# Patient Record
Sex: Male | Born: 1935
Health system: Southern US, Community
[De-identification: ages and names within clinical notes are randomized; demographics above are authoritative.]

## PROBLEM LIST (undated history)

## (undated) DIAGNOSIS — I4891 Unspecified atrial fibrillation: Secondary | ICD-10-CM

## (undated) DIAGNOSIS — M19072 Primary osteoarthritis, left ankle and foot: Secondary | ICD-10-CM

## (undated) DIAGNOSIS — I639 Cerebral infarction, unspecified: Secondary | ICD-10-CM

## (undated) DIAGNOSIS — Z8719 Personal history of other diseases of the digestive system: Secondary | ICD-10-CM

## (undated) DIAGNOSIS — M19071 Primary osteoarthritis, right ankle and foot: Secondary | ICD-10-CM

## (undated) DIAGNOSIS — Z87898 Personal history of other specified conditions: Secondary | ICD-10-CM

## (undated) DIAGNOSIS — I471 Supraventricular tachycardia: Secondary | ICD-10-CM

## (undated) DIAGNOSIS — E785 Hyperlipidemia, unspecified: Secondary | ICD-10-CM

## (undated) DIAGNOSIS — I4719 Other supraventricular tachycardia: Secondary | ICD-10-CM

## (undated) DIAGNOSIS — Z7901 Long term (current) use of anticoagulants: Secondary | ICD-10-CM

## (undated) DIAGNOSIS — E876 Hypokalemia: Secondary | ICD-10-CM

## (undated) DIAGNOSIS — Z9229 Personal history of other drug therapy: Secondary | ICD-10-CM

## (undated) DIAGNOSIS — K219 Gastro-esophageal reflux disease without esophagitis: Secondary | ICD-10-CM

## (undated) DIAGNOSIS — Z923 Personal history of irradiation: Secondary | ICD-10-CM

## (undated) DIAGNOSIS — I10 Essential (primary) hypertension: Secondary | ICD-10-CM

## (undated) DIAGNOSIS — I619 Nontraumatic intracerebral hemorrhage, unspecified: Secondary | ICD-10-CM

## (undated) DIAGNOSIS — G8929 Other chronic pain: Secondary | ICD-10-CM

## (undated) DIAGNOSIS — M549 Dorsalgia, unspecified: Secondary | ICD-10-CM

## (undated) DIAGNOSIS — G43909 Migraine, unspecified, not intractable, without status migrainosus: Secondary | ICD-10-CM

## (undated) DIAGNOSIS — C61 Malignant neoplasm of prostate: Secondary | ICD-10-CM

## (undated) DIAGNOSIS — C439 Malignant melanoma of skin, unspecified: Secondary | ICD-10-CM

## (undated) DIAGNOSIS — J189 Pneumonia, unspecified organism: Secondary | ICD-10-CM

## (undated) HISTORY — DX: Malignant neoplasm of prostate: C61

## (undated) HISTORY — DX: Dorsalgia, unspecified: M54.9

## (undated) HISTORY — PX: TONSILLECTOMY: SUR1361

## (undated) HISTORY — DX: Cerebral infarction, unspecified: I63.9

## (undated) HISTORY — DX: Hypokalemia: E87.6

## (undated) HISTORY — DX: Malignant melanoma of skin, unspecified: C43.9

## (undated) HISTORY — DX: Essential (primary) hypertension: I10

## (undated) HISTORY — DX: Personal history of other drug therapy: Z92.29

## (undated) HISTORY — DX: Personal history of other specified conditions: Z87.898

## (undated) HISTORY — DX: Hyperlipidemia, unspecified: E78.5

## (undated) HISTORY — DX: Long term (current) use of anticoagulants: Z79.01

## (undated) HISTORY — PX: CATARACT EXTRACTION W/ INTRAOCULAR LENS  IMPLANT, BILATERAL: SHX1307

## (undated) HISTORY — DX: Unspecified atrial fibrillation: I48.91

## (undated) HISTORY — DX: Other chronic pain: G89.29

---

## 1996-02-23 HISTORY — PX: NASAL SINUS SURGERY: SHX719

## 1997-06-07 ENCOUNTER — Other Ambulatory Visit: Admission: RE | Admit: 1997-06-07 | Discharge: 1997-06-07 | Payer: Self-pay | Admitting: Cardiology

## 2000-09-22 ENCOUNTER — Ambulatory Visit (HOSPITAL_COMMUNITY): Admission: RE | Admit: 2000-09-22 | Discharge: 2000-09-22 | Payer: Self-pay | Admitting: Gastroenterology

## 2000-12-13 ENCOUNTER — Encounter: Payer: Self-pay | Admitting: Emergency Medicine

## 2000-12-13 ENCOUNTER — Inpatient Hospital Stay (HOSPITAL_COMMUNITY): Admission: EM | Admit: 2000-12-13 | Discharge: 2000-12-27 | Payer: Self-pay | Admitting: Emergency Medicine

## 2000-12-13 ENCOUNTER — Encounter: Payer: Self-pay | Admitting: Neurology

## 2000-12-14 ENCOUNTER — Encounter: Payer: Self-pay | Admitting: *Deleted

## 2000-12-15 ENCOUNTER — Encounter: Payer: Self-pay | Admitting: *Deleted

## 2000-12-17 ENCOUNTER — Encounter: Payer: Self-pay | Admitting: Neurology

## 2000-12-18 ENCOUNTER — Encounter: Payer: Self-pay | Admitting: *Deleted

## 2000-12-18 ENCOUNTER — Encounter: Payer: Self-pay | Admitting: Neurology

## 2000-12-20 ENCOUNTER — Encounter: Payer: Self-pay | Admitting: *Deleted

## 2000-12-23 ENCOUNTER — Encounter: Payer: Self-pay | Admitting: *Deleted

## 2000-12-26 ENCOUNTER — Encounter: Payer: Self-pay | Admitting: *Deleted

## 2000-12-26 ENCOUNTER — Encounter: Payer: Self-pay | Admitting: Neurology

## 2000-12-27 ENCOUNTER — Inpatient Hospital Stay (HOSPITAL_COMMUNITY)
Admission: RE | Admit: 2000-12-27 | Discharge: 2001-01-28 | Payer: Self-pay | Admitting: Physical Medicine & Rehabilitation

## 2001-01-30 ENCOUNTER — Encounter
Admission: RE | Admit: 2001-01-30 | Discharge: 2001-04-30 | Payer: Self-pay | Admitting: Physical Medicine & Rehabilitation

## 2001-05-01 ENCOUNTER — Encounter
Admission: RE | Admit: 2001-05-01 | Discharge: 2001-05-24 | Payer: Self-pay | Admitting: Physical Medicine & Rehabilitation

## 2002-07-19 ENCOUNTER — Emergency Department (HOSPITAL_COMMUNITY): Admission: EM | Admit: 2002-07-19 | Discharge: 2002-07-19 | Payer: Self-pay | Admitting: Emergency Medicine

## 2002-07-19 DIAGNOSIS — Z87898 Personal history of other specified conditions: Secondary | ICD-10-CM

## 2002-07-19 HISTORY — DX: Personal history of other specified conditions: Z87.898

## 2002-11-29 ENCOUNTER — Encounter
Admission: RE | Admit: 2002-11-29 | Discharge: 2003-02-27 | Payer: Self-pay | Admitting: Physical Medicine & Rehabilitation

## 2002-12-10 ENCOUNTER — Encounter
Admission: RE | Admit: 2002-12-10 | Discharge: 2003-01-31 | Payer: Self-pay | Admitting: Physical Medicine & Rehabilitation

## 2003-02-27 ENCOUNTER — Encounter
Admission: RE | Admit: 2003-02-27 | Discharge: 2003-05-28 | Payer: Self-pay | Admitting: Physical Medicine & Rehabilitation

## 2007-03-22 ENCOUNTER — Encounter: Admission: RE | Admit: 2007-03-22 | Discharge: 2007-03-22 | Payer: Self-pay | Admitting: Cardiology

## 2008-02-23 HISTORY — PX: MELANOMA EXCISION: SHX5266

## 2008-02-23 HISTORY — PX: SKIN GRAFT: SHX250

## 2008-07-05 ENCOUNTER — Encounter (INDEPENDENT_AMBULATORY_CARE_PROVIDER_SITE_OTHER): Payer: Self-pay | Admitting: General Surgery

## 2008-07-05 ENCOUNTER — Ambulatory Visit (HOSPITAL_COMMUNITY): Admission: RE | Admit: 2008-07-05 | Discharge: 2008-07-05 | Payer: Self-pay | Admitting: General Surgery

## 2008-07-18 ENCOUNTER — Encounter (INDEPENDENT_AMBULATORY_CARE_PROVIDER_SITE_OTHER): Payer: Self-pay | Admitting: General Surgery

## 2008-07-18 ENCOUNTER — Ambulatory Visit (HOSPITAL_BASED_OUTPATIENT_CLINIC_OR_DEPARTMENT_OTHER): Admission: RE | Admit: 2008-07-18 | Discharge: 2008-07-18 | Payer: Self-pay | Admitting: General Surgery

## 2009-04-27 ENCOUNTER — Ambulatory Visit: Payer: Self-pay | Admitting: Vascular Surgery

## 2009-04-27 ENCOUNTER — Inpatient Hospital Stay (HOSPITAL_COMMUNITY): Admission: EM | Admit: 2009-04-27 | Discharge: 2009-05-02 | Payer: Self-pay | Admitting: Emergency Medicine

## 2009-04-29 ENCOUNTER — Encounter: Payer: Self-pay | Admitting: Cardiology

## 2009-04-30 ENCOUNTER — Encounter: Payer: Self-pay | Admitting: Cardiology

## 2009-05-16 ENCOUNTER — Encounter: Payer: Self-pay | Admitting: Interventional Radiology

## 2009-06-04 ENCOUNTER — Ambulatory Visit: Admission: RE | Admit: 2009-06-04 | Discharge: 2009-08-04 | Payer: Self-pay | Admitting: Radiation Oncology

## 2009-10-07 ENCOUNTER — Ambulatory Visit: Payer: Self-pay | Admitting: Cardiology

## 2009-10-15 ENCOUNTER — Ambulatory Visit: Payer: Self-pay | Admitting: Cardiology

## 2009-11-11 ENCOUNTER — Ambulatory Visit: Payer: Self-pay | Admitting: Cardiology

## 2009-12-12 ENCOUNTER — Ambulatory Visit: Payer: Self-pay | Admitting: Cardiology

## 2010-01-09 ENCOUNTER — Ambulatory Visit: Payer: Self-pay | Admitting: Cardiology

## 2010-01-29 ENCOUNTER — Ambulatory Visit: Payer: Self-pay | Admitting: Cardiology

## 2010-03-03 ENCOUNTER — Ambulatory Visit: Payer: Self-pay | Admitting: Cardiovascular Disease

## 2010-03-13 ENCOUNTER — Ambulatory Visit: Payer: Self-pay | Admitting: Cardiology

## 2010-03-18 ENCOUNTER — Ambulatory Visit: Payer: Self-pay | Admitting: Cardiovascular Disease

## 2010-03-26 ENCOUNTER — Encounter (INDEPENDENT_AMBULATORY_CARE_PROVIDER_SITE_OTHER): Payer: Medicare Other

## 2010-03-26 DIAGNOSIS — Z7901 Long term (current) use of anticoagulants: Secondary | ICD-10-CM

## 2010-04-24 ENCOUNTER — Other Ambulatory Visit (INDEPENDENT_AMBULATORY_CARE_PROVIDER_SITE_OTHER): Payer: Medicare Other

## 2010-04-24 DIAGNOSIS — I4891 Unspecified atrial fibrillation: Secondary | ICD-10-CM

## 2010-04-24 DIAGNOSIS — I119 Hypertensive heart disease without heart failure: Secondary | ICD-10-CM

## 2010-04-24 DIAGNOSIS — Z7901 Long term (current) use of anticoagulants: Secondary | ICD-10-CM

## 2010-05-01 ENCOUNTER — Ambulatory Visit (INDEPENDENT_AMBULATORY_CARE_PROVIDER_SITE_OTHER): Payer: Medicare Other | Admitting: Cardiology

## 2010-05-01 DIAGNOSIS — Z79899 Other long term (current) drug therapy: Secondary | ICD-10-CM

## 2010-05-01 DIAGNOSIS — I4891 Unspecified atrial fibrillation: Secondary | ICD-10-CM

## 2010-05-15 ENCOUNTER — Other Ambulatory Visit: Payer: Self-pay | Admitting: *Deleted

## 2010-05-15 DIAGNOSIS — I4891 Unspecified atrial fibrillation: Secondary | ICD-10-CM

## 2010-05-15 MED ORDER — WARFARIN SODIUM 5 MG PO TABS: ORAL_TABLET | ORAL | Status: DC

## 2010-05-15 NOTE — Telephone Encounter (Signed)
Refilled meds per fax request.  

## 2010-05-17 LAB — URINALYSIS, MICROSCOPIC ONLY
Bilirubin Urine: NEGATIVE
Ketones, ur: NEGATIVE mg/dL
Nitrite: NEGATIVE
Specific Gravity, Urine: 1.016 (ref 1.005–1.030)
Urobilinogen, UA: 1 mg/dL (ref 0.0–1.0)

## 2010-05-17 LAB — CBC
HCT: 31.5 % — ABNORMAL LOW (ref 39.0–52.0)
HCT: 31.6 % — ABNORMAL LOW (ref 39.0–52.0)
HCT: 34.5 % — ABNORMAL LOW (ref 39.0–52.0)
HCT: 35.5 % — ABNORMAL LOW (ref 39.0–52.0)
HCT: 38 % — ABNORMAL LOW (ref 39.0–52.0)
Hemoglobin: 10.7 g/dL — ABNORMAL LOW (ref 13.0–17.0)
Hemoglobin: 11.5 g/dL — ABNORMAL LOW (ref 13.0–17.0)
Hemoglobin: 11.7 g/dL — ABNORMAL LOW (ref 13.0–17.0)
Hemoglobin: 12.1 g/dL — ABNORMAL LOW (ref 13.0–17.0)
Hemoglobin: 12.8 g/dL — ABNORMAL LOW (ref 13.0–17.0)
MCHC: 33.3 g/dL (ref 30.0–36.0)
MCHC: 33.7 g/dL (ref 30.0–36.0)
MCHC: 33.8 g/dL (ref 30.0–36.0)
MCHC: 33.9 g/dL (ref 30.0–36.0)
MCHC: 34.1 g/dL (ref 30.0–36.0)
MCV: 92.8 fL (ref 78.0–100.0)
MCV: 92.9 fL (ref 78.0–100.0)
MCV: 93.4 fL (ref 78.0–100.0)
Platelets: 164 10*3/uL (ref 150–400)
Platelets: 172 10*3/uL (ref 150–400)
Platelets: 198 10*3/uL (ref 150–400)
RBC: 3.39 MIL/uL — ABNORMAL LOW (ref 4.22–5.81)
RBC: 3.82 MIL/uL — ABNORMAL LOW (ref 4.22–5.81)
RDW: 14.9 % (ref 11.5–15.5)
RDW: 15 % (ref 11.5–15.5)
RDW: 15.1 % (ref 11.5–15.5)
RDW: 15.2 % (ref 11.5–15.5)
RDW: 15.3 % (ref 11.5–15.5)
RDW: 15.6 % — ABNORMAL HIGH (ref 11.5–15.5)
WBC: 10.5 10*3/uL (ref 4.0–10.5)
WBC: 8 10*3/uL (ref 4.0–10.5)

## 2010-05-17 LAB — COMPREHENSIVE METABOLIC PANEL
ALT: 14 U/L (ref 0–53)
AST: 17 U/L (ref 0–37)
AST: 29 U/L (ref 0–37)
Albumin: 2.3 g/dL — ABNORMAL LOW (ref 3.5–5.2)
BUN: 31 mg/dL — ABNORMAL HIGH (ref 6–23)
BUN: 38 mg/dL — ABNORMAL HIGH (ref 6–23)
Chloride: 108 mEq/L (ref 96–112)
Creatinine, Ser: 2.06 mg/dL — ABNORMAL HIGH (ref 0.4–1.5)
Creatinine, Ser: 2.11 mg/dL — ABNORMAL HIGH (ref 0.4–1.5)
GFR calc Af Amer: 37 mL/min — ABNORMAL LOW (ref >60)
Total Protein: 5.4 g/dL — ABNORMAL LOW (ref 6.0–8.3)
Total Protein: 6.4 g/dL (ref 6.0–8.3)

## 2010-05-17 LAB — POCT I-STAT, CHEM 8
BUN: 43 mg/dL — ABNORMAL HIGH (ref 6–23)
HCT: 38 % — ABNORMAL LOW (ref 39.0–52.0)
Hemoglobin: 12.9 g/dL — ABNORMAL LOW (ref 13.0–17.0)

## 2010-05-17 LAB — BASIC METABOLIC PANEL
CO2: 29 mEq/L (ref 19–32)
Calcium: 9.9 mg/dL (ref 8.4–10.5)
Creatinine, Ser: 2.04 mg/dL — ABNORMAL HIGH (ref 0.4–1.5)
Glucose, Bld: 109 mg/dL — ABNORMAL HIGH (ref 70–99)

## 2010-05-17 LAB — TROPONIN I: Troponin I: 0.01 ng/mL (ref 0.00–0.06)

## 2010-05-17 LAB — DIFFERENTIAL
Basophils Absolute: 0 10*3/uL (ref 0.0–0.1)
Basophils Relative: 0 % (ref 0–1)
Eosinophils Absolute: 0.2 10*3/uL (ref 0.0–0.7)
Eosinophils Relative: 2 % (ref 0–5)
Eosinophils Relative: 2 % (ref 0–5)
Lymphocytes Relative: 25 % (ref 12–46)
Lymphs Abs: 2.4 10*3/uL (ref 0.7–4.0)
Lymphs Abs: 2.6 10*3/uL (ref 0.7–4.0)
Monocytes Absolute: 0.9 10*3/uL (ref 0.1–1.0)
Monocytes Absolute: 1.1 10*3/uL — ABNORMAL HIGH (ref 0.1–1.0)
Monocytes Relative: 11 % (ref 3–12)
Monocytes Relative: 9 % (ref 3–12)
Neutro Abs: 5.8 10*3/uL (ref 1.7–7.7)
Neutrophils Relative %: 64 % (ref 43–77)

## 2010-05-17 LAB — POCT CARDIAC MARKERS
CKMB, poc: 2 ng/mL (ref 1.0–8.0)
Myoglobin, poc: 204 ng/mL (ref 12–200)
Troponin i, poc: <0.05 ng/mL (ref 0.00–0.09)

## 2010-05-17 LAB — CULTURE, BLOOD (ROUTINE X 2): Culture: NO GROWTH

## 2010-05-17 LAB — CK TOTAL AND CKMB (NOT AT ARMC): Relative Index: 1.7 (ref 0.0–2.5)

## 2010-05-17 LAB — LIPASE, BLOOD: Lipase: 30 U/L (ref 11–59)

## 2010-05-17 LAB — HEPARIN LEVEL (UNFRACTIONATED)
Heparin Unfractionated: 0.43 IU/mL (ref 0.30–0.70)
Heparin Unfractionated: 0.57 IU/mL (ref 0.30–0.70)

## 2010-05-17 LAB — HEPATIC FUNCTION PANEL
Indirect Bilirubin: 0.5 mg/dL (ref 0.3–0.9)
Total Bilirubin: 0.6 mg/dL (ref 0.3–1.2)
Total Protein: 6.9 g/dL (ref 6.0–8.3)

## 2010-05-17 LAB — APTT: aPTT: 31 seconds (ref 24–37)

## 2010-05-17 LAB — PSA: PSA: 19.59 ng/mL — ABNORMAL HIGH (ref 0.10–4.00)

## 2010-05-17 LAB — HEMOGLOBIN A1C: Hgb A1c MFr Bld: 6 % (ref 4.6–6.1)

## 2010-05-17 LAB — PROTIME-INR
Prothrombin Time: 14.4 seconds (ref 11.6–15.2)
Prothrombin Time: 15.3 seconds — ABNORMAL HIGH (ref 11.6–15.2)

## 2010-05-25 ENCOUNTER — Ambulatory Visit (INDEPENDENT_AMBULATORY_CARE_PROVIDER_SITE_OTHER): Payer: Medicare Other | Admitting: *Deleted

## 2010-05-25 DIAGNOSIS — I482 Chronic atrial fibrillation, unspecified: Secondary | ICD-10-CM | POA: Insufficient documentation

## 2010-05-25 DIAGNOSIS — Z7901 Long term (current) use of anticoagulants: Secondary | ICD-10-CM

## 2010-05-25 DIAGNOSIS — I4891 Unspecified atrial fibrillation: Secondary | ICD-10-CM

## 2010-06-02 ENCOUNTER — Other Ambulatory Visit: Payer: Self-pay | Admitting: *Deleted

## 2010-06-02 LAB — CBC
HCT: 42.5 % (ref 39.0–52.0)
Hemoglobin: 14.3 g/dL (ref 13.0–17.0)
MCHC: 33.7 g/dL (ref 30.0–36.0)
MCV: 93.7 fL (ref 78.0–100.0)
Platelets: 203 10*3/uL (ref 150–400)
RDW: 14.2 % (ref 11.5–15.5)
WBC: 9.3 10*3/uL (ref 4.0–10.5)

## 2010-06-02 LAB — BASIC METABOLIC PANEL
CO2: 28 mEq/L (ref 19–32)
Calcium: 10.1 mg/dL (ref 8.4–10.5)
Chloride: 105 mEq/L (ref 96–112)
GFR calc Af Amer: 35 mL/min — ABNORMAL LOW (ref >60)
Sodium: 140 mEq/L (ref 135–145)

## 2010-06-02 LAB — POCT HEMOGLOBIN-HEMACUE: Hemoglobin: 13.8 g/dL (ref 13.0–17.0)

## 2010-06-02 LAB — GLUCOSE, CAPILLARY

## 2010-06-02 MED ORDER — FUROSEMIDE 20 MG PO TABS: 20.0000 mg | ORAL_TABLET | Freq: Every day | ORAL | Status: DC

## 2010-06-11 ENCOUNTER — Other Ambulatory Visit: Payer: Self-pay | Admitting: *Deleted

## 2010-06-11 DIAGNOSIS — I10 Essential (primary) hypertension: Secondary | ICD-10-CM

## 2010-06-11 MED ORDER — POTASSIUM CHLORIDE CRYS ER 20 MEQ PO TBCR: 20.0000 meq | EXTENDED_RELEASE_TABLET | Freq: Every day | ORAL | Status: DC

## 2010-06-11 NOTE — Telephone Encounter (Signed)
Faxed refill for potassium 20 meq to [facility]

## 2010-06-23 ENCOUNTER — Encounter: Payer: Medicare Other | Admitting: *Deleted

## 2010-06-25 ENCOUNTER — Ambulatory Visit (INDEPENDENT_AMBULATORY_CARE_PROVIDER_SITE_OTHER): Payer: Medicare Other | Admitting: *Deleted

## 2010-06-25 DIAGNOSIS — I4891 Unspecified atrial fibrillation: Secondary | ICD-10-CM

## 2010-06-25 LAB — POCT INR: INR: 3.8

## 2010-06-30 ENCOUNTER — Telehealth: Payer: Self-pay | Admitting: *Deleted

## 2010-06-30 NOTE — Telephone Encounter (Signed)
Patient wife phoned stating Dr. Mayford Knife wanted to put patient on cipro or septra which ever one didn't effect coumadin.  Advised they both did.  Dr. Mayford Knife preference was septra bid for 10 days for skin/scalp problem.  Will check INR Friday to see what level is on antibiotic.

## 2010-07-03 ENCOUNTER — Ambulatory Visit (INDEPENDENT_AMBULATORY_CARE_PROVIDER_SITE_OTHER): Payer: Medicare Other | Admitting: *Deleted

## 2010-07-03 DIAGNOSIS — I4891 Unspecified atrial fibrillation: Secondary | ICD-10-CM

## 2010-07-07 NOTE — Op Note (Signed)
NAME:  Craig, [name] NO.:  [id number]   MEDICAL RECORD NO.:  [id number]          PATIENT TYPE:  AMB   LOCATION:  SDS                          FACILITY:  [facility]   PHYSICIAN:  Gabrielle Dare. Janee Morn, M.D.DATE OF BIRTH:  May 25, 1924   DATE OF PROCEDURE:  07/05/2008  DATE OF DISCHARGE:  07/05/2008                               OPERATIVE REPORT   PREOPERATIVE DIAGNOSIS:  Melanoma in situ scalp.   POSTOPERATIVE DIAGNOSIS:  Melanoma in situ scalp.   PROCEDURE:  Wide excision melanoma in situ scalp 3.5 cm x 3.5 cm.   SURGEON:  Gabrielle Dare. Janee Morn, MD   ANESTHESIA:  MAC.   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  He has been  followed by Dr. Mayford Knife.  Since that time, he developed a pigmented  lesion in a portion of the skin graft from his previous surgery.  He  underwent a biopsy there and it demonstrates melanoma in situ.  We spoke  in detail in the office and the patient has had a lot of trouble with  his old skin graft and he declines any skin graft coverage at this time  and wants to just do some local wound care and see how the wound heals  primarily.  He presents for elective wide excision of this melanoma in  situ today.   PROCEDURE IN DETAIL:  Informed consent was obtained.  The patient's site  was marked.  He was identified in the preop holding area.  He received  intravenous antibiotics.  He was brought to the operating room.  MAC  anesthesia was administered by the anesthesia staff.  The primary scalp  was prepped and draped in sterile fashion.  Time-out procedure was done.  An area giving at least 1 cm margin circumferentially around this  pigment and in partially scab lesion was measured.  The area was then  infiltrated with 0.25% Marcaine with epinephrine.  Wide excision was  then done sharply and using Bovie  cautery.  I have taken all of the skin  and subcutaneous fat.  There is no evidence of pigmentation on the under  side of the excision.  The excision was labeled with suture for  pathology and passed off.  Hemostasis was obtained in the wound.  Some  additional local anesthetic was injected and once hemostasis was  ensured, a wet-to-dry dressing was placed.  This completed the  procedure.  The patient tolerated the procedure well without apparent  complication and was taken to recovery room in stable condition.      Gabrielle Dare Janee Morn, M.D.  Electronically Signed    BET/MEDQ  D:  07/05/2008  T:  07/06/2008  Job:  [id number]   cc:   [name], M.D.  Dollene Cleveland, M.D.

## 2010-07-07 NOTE — Op Note (Signed)
NAME:  Craig [name], Craig [name] NO.:  [id number]   MEDICAL RECORD NO.:  [id number]          PATIENT TYPE:  AMB   LOCATION:  DSC                          FACILITY:  [facility]   PHYSICIAN:  Gabrielle Dare. Janee Morn, M.D.DATE OF BIRTH:  August 08, 1924   DATE OF PROCEDURE:  07/18/2008  DATE OF DISCHARGE:                               OPERATIVE REPORT   PREOPERATIVE DIAGNOSIS:  Melanoma in situ, scalp.   POSTOPERATIVE DIAGNOSIS:  Melanoma in situ, scalp.   PROCEDURE:  Wide excision of melanoma in situ, scalp, 4 x 3.5 cm.   SURGEON:  Gabrielle Dare. 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  He developed a pigmented area along one side which was biopsied  demonstrating melanoma in situ.  He underwent wide excision of this area  about 2 weeks ago, however, on followup with his dermatologist, this  excision did not encompass the entirety of the area of concern.  So, he  presents for wide excision again at the adjacent area.   PROCEDURE IN DETAIL:  Informed consent was obtained.  The patient's site  was clearly marked in the preop holding area.  It had also been marked  yesterday by his dermatologist.  He was brought to the operating room.  MAC anesthesia was administered.  He received intravenous antibiotics.  His scalp was prepped and draped in sterile fashion.  The area was  measured out giving at least 1-cm margin circumferentially around this  pigmented area.  Subcutaneous tissues were injected with a mixture of  0.25% Marcaine plain and 1% lidocaine with epinephrine.  An incision was  made.  Subcutaneous tissues were dissected down encompassing the skin  and subcutaneous fat.  This entire area was excised.  It was marked with  sutures for orientation for pathology and sent fresh.  The skin defect  was  then irrigated.  Hemostasis was obtained using Bovie cautery.  Again  per the patient's request, no skin graft was done.  We checked the wound  carefully and ensured good hemostasis and then placed wet-to-dry  dressings on this site and the old wide excision site which were  sterile.  There were no apparent complications.  The patient tolerated  the procedure well without apparent complication and was taken to the  recovery room in stable condition.      Gabrielle Dare Janee Morn, M.D.  Electronically Signed     BET/MEDQ  D:  07/18/2008  T:  07/19/2008  Job:  [id number]   cc:   Dollene Cleveland, M.D.  [name], M.D.

## 2010-07-09 ENCOUNTER — Ambulatory Visit (INDEPENDENT_AMBULATORY_CARE_PROVIDER_SITE_OTHER): Payer: Medicare Other | Admitting: *Deleted

## 2010-07-09 ENCOUNTER — Telehealth: Payer: Self-pay | Admitting: Cardiology

## 2010-07-09 DIAGNOSIS — I4891 Unspecified atrial fibrillation: Secondary | ICD-10-CM

## 2010-07-09 MED ORDER — WARFARIN SODIUM 2.5 MG PO TABS: ORAL_TABLET | ORAL | Status: DC

## 2010-07-09 NOTE — Telephone Encounter (Signed)
Pt's wife said she will not be home until 430 and wants to know husbands coumadin dosage

## 2010-07-09 NOTE — Telephone Encounter (Signed)
Phoned in new dose of coumadin

## 2010-07-10 NOTE — Assessment & Plan Note (Signed)
Craig [name] is back regarding his right cerebellar and pontine strokes.  We sent  him for outpatient therapy with suspicion of some vestibular problems.  He  is not sure if therapy has helped him significantly with those.  He is  having positional symptoms that only last for a second or two and then he  seems to re-equilibrate.  He has had no near falls.  He does feel that  therapy has helped with his gait efficiency.  He denies any mood or sleep  problems.  Bowel and bladder habits are normal.  No chest pain, shortness of  breath, nausea, or vomiting.  He has not been persistent with following  through with his vestibular exercises, however.   PHYSICAL EXAMINATION:  On physical examination today, the patient is  pleasant and in no acute distress.  The blood pressure is 118/66 and the  pulse is 56.  He is 96% on room air.  Gait pattern was actually more fluid  than previously.  He still some steppage gait on the left side, but this is  really minimally.  He had good weight shift overall.  Reflexes are 2+.  Fine  motor coordination was fair to good.  Strength was 5/5.  Cranial nerve exam  is grossly intact.  Hallpike's maneuver was negative for symptoms or  nystagmus today.   ASSESSMENT:  1. Status post right cerebellar and right pontine strokes.  2. Positional vertigo.  3. Gait disorder.   PLAN:  1. The patient needs to follow up with his home exercise program.  I think     that he would do well with a supervision Nautilus weight program.  He     also would benefit from an aerobics type of program, including treadmill     or a bicycle.  He also needs to remember to do his vestibular exercises.  2. The patient is to continue to monitor his blood pressure closely as well,     which he is doing at this point.  3. I can see the patient back on an as needed basis.      Ranelle Oyster, M.D.   ZTS/MedQ  D:  03/01/2003 11:56:30  T:  03/01/2003 12:09:28  Job #:  [id number]   cc:   [name], M.D.  [address], [address]  [place]  [place] [address]  Fax: [phone]

## 2010-07-10 NOTE — Consult Note (Signed)
[place]  Patient:    Craig [name], Craig [name] Visit Number: [id number] MRN: [id number]          Service Type: Attending:  Rozanna Boer., M.D. Dictated by:   Rozanna Boer., M.D. Proc. Date: 12/27/00                            Consultation Report  REASON FOR CONSULTATION:  Urinary retention after stroke.  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 The stroke was on December 13, 2000. He presented with slurred speech and some difficulty with his equilibrium. He had distal left vertebral stenosis and occlusion of his right vertebral artery. The patient was placed on Coumadin for CVA prophylaxis and was previously on Plavix. After the hemorrhagic CVA on December 18, 2000, he was transferred to rehab. I have seen Maddox over the years for his mild obstructive symptoms and his mildly elevated PSA. I last saw him on Jul 04, 2000, when his PSA was 4.77. His free PSA, however, was 28% which is in the benign range, and we were just following that. He was due to have another check this month. His PSA ranges from 3 to 5.2 which was the highest he has had over the last 10 years. When I saw him in May, he was complaining of a little bit of trouble with his eyes and double vision that had cleared up, and it was thought that maybe this was a slight stroke even at that time. He had been on Plavix, aspirin, Ziac, and Norvasc. Since he has been in the rehab, he first was on catheter drainage because of large residuals that reached as high as 900 cc. His diplopia of his right gaze has improved, but he is still somewhat weakened. He is doing fairly well with his rehab. He started bladder training on November 13 with in-and-out catheterizations every six hours. Over the last  week, he was emptying about a third with voiding anywhere from 100 to 300 cc with residuals of anywhere from 300 to 500 cc. The catheter attempts are not that uncomfortable, and he is doing fairly well with his rehab. He has been on Flomax since he has been in rehab but started on low-dose Urecholine last week at 10 mg q.i.d. Today, the 18th, I increased his Urecholine to 25 mg p.o. q.i.d. which was to be given 30 to 60 minutes before his q.6h. catheterization so he can have time to void. I also increased his Flomax from 0.4 mg daily to 0.4 mg b.i.d. and added Proscar 5 mg daily. We will send a catheterized specimen for culture to treat if he does develop a UTI. We will encourage his bladder training which is progressing fairly well at this point.  IMPRESSION: 1. Neurogenic bladder secondary to cerebellar and right pontine stroke. 2. Mild obstructive uropathy with elevated PSA of an apparent benign origin.  RECOMMENDATION:  We will have to follow up his PSA at a later time once he is voiding and he is not being catheterized. We will continue to follow with you. ictated by:   Rozanna Boer., M.D. Attending:  Rozanna Boer., M.D. DD:  01/09/01 TD:  01/09/01 Job: [id number]

## 2010-07-10 NOTE — Discharge Summary (Signed)
[place]  Patient:    Craig [name], Craig [name] Visit Number: [id number] MRN: [id number]          Service Type: [facility] Location: [id number] Attending Physician:  Faith Rogue T Dictated by:   Annie Main, N.P. Admit Date:  12/27/2000 Disc. Date: 12/27/00   CC:         [name] A. Patty Sermons, M.D.             Rozanna Boer., M.D.                           Discharge Summary  DATE OF BIRTH: [birth year]  ADMISSION DIAGNOSIS: Right pontine stroke.  DISCHARGE DIAGNOSES: 1. Right pontine nonhemorrhagic embolic infarction. 2. Ischemic right cerebellar infarction with embolic source. 3. Right cerebellar hemorrhagic transformation.  ADDITIONAL DISCHARGE DIAGNOSES:  1. Hypertension.  2. Hypercholesterolemia.  3. Obesity.  4. Transient ischemic attack.  5. Has a history of spontaneous atrial fibrillation in 1994 which converted     spontaneous and, to the rest of anybody knowledge, has not returned.  6. Urinary tract infection this admission.  7. Sinus disease status post sinus surgery.  8. Chronic renal insufficiency.  9. Benign prostatic hypertrophy. 10. Migraine R without headache.   DISCHARGE MEDICATIONS: 1. Flonase once per day each nostril q.h.s. 2. Senokot S one tablet p.o. b.i.d. 3. Cozaar 15 mg q.d. 4. Hydrochlorothiazide 12.5 mg q.d. 5. Ziac 10/6.25 q.d. 6. Protonix 40 mg q.d. 7. Potassium chloride 20 mEq q.d.  ACTIVITY:  Up with assistance as tolerated.  DIET:  Regular consistency with thin liquids.  STUDIES PERFORMED: 1. A CT of the head performed 10/22 showing mild symmetrical atrophy.  No evidence of intracranial mass or hemorrhage.  ___________ windows are normal except for minimal mucosal membrane thickening of the ethmoid air cells.  2. MRI performed 12/13/00 showing an acute infarction of the right cerebellar and the distribution of the right PICA and superior cerebellar arteries. Also acute infarction of the  right pons involvement of perforating basilar artery branches, plus acute infarction involving the medical aspect of the right temporal lobe due to involvement of the right PCA temporal branch.  3. MRA performed 12/13/00 showing distal left vertebral artery stenosis is progressing.  Occlusion of right vertebral artery, including its PICA branch. Disease involving the right PCA and right superior cerebellar artery.  There is disease involving the middle cerebral arteries, particularly on the left with significant appearing left M 1 segment stenosis.  4. CT of the head performed 12/14/00 showing evolving nonhemorrhagic stroke involving the right pons, right cerebellar hemisphere, and most likely the medial right temporal lobe.  No evidence of hemorrhage.  5. EKG performed 12/13/00 showing sinus bradycardia with nonspecific ST abnormality.  6. Carotid Dopplers performed 12/14/00 showing no ICA stenosis bilateral. Left ECA stenosis.  Vertebral artery flow antegrade bilaterally.  7. A 2-D echo performed 12/14/00 showing ejection fraction of 65-75% with no LV wall abnormalities.  No embolic source.  8. Swallowing function performed 12/15/00 showing intermittent penetration to the cords with thin liquids.  9. CT of the head performed 12/17/00 showing interval of development of a 3 cm cerebellar ___________ hemorrhage inferiorly with mass effect on the fourth ventricle, although no hydrocephalus at this stage.  Infarcts in the right cerebellar, right pons, and right medial temporal lobe remain present.  10. Chest x-ray performed 12/18/00 showing right lower lobe infiltrate versus atelectasis.  Contrast present in  the colon.  No signs of dilated bowels. Small bowel is gasless.  Cardiomegaly.  11. CT of the head performed 12/18/00 showing slight increased size hemorrhagic transformation of cerebellar infarct.  Slight increase mass effect on the fourth ventricle.  No intravenous of  hydrocephalus.  12. CT of the head performed 12/20/00 showing stable hemorrhagic cerebellar stroke with stable right pontine and right temporal parietal deep nonhemorrhagic stroke.  No new intracranial abnormalities.  No evidence of hydrocephalus.  Minimal bilateral ethmoid sinus disease.  13. CT of the head performed 12/26/00 showing resolution of hemorrhage.  No increasing in mass effect or edema.  14. Swallowing evaluation performed on 12/23/00 showing aspiration otherwise thin liquid on the third swallow.  Otherwise, patient unable to swallow other consistencies without difficulty.  15. Chest x-ray performed 12/26/00 showing poor inspiration with changes from previous granulomatous disease and cardiomegaly disease.  The heart is enlarged.  Poor inspiration.  LABORATORY STUDIES: CBC on admission was normal with white blood cells 9.9, red blood cells 4.5, hemoglobin 13.9, platelets 238,000.  On 12/26/00 CBC shows white cells 10.0, white blood cells 3.96, hemoglobin 12.4, and platelets of 294,000.  White blood cells were elevated on 10/27 and 10/28 and 10.8 and 11.1 respectively. Blood baths on admission showed pH of 7.43, PCO2 41.3, bicarbonate 29.0, PCO2 30.0.  Differential on 10/27 showed neutrophils of 80 with absolute neutrophils 8.7.  On 10/28 neutrophils remained elevated at 8.1 and absolute monocytes were 1.0.  PT on admission was 13.3 with INR of 1.0.  It was last checked on 10/27 with a PT of 14.4 and INR of 1.2.  Routine chemistry on admission showed sodium 138, potassium 3.6, chloride 103, CO2 29, glucose 131, BUN 25, creatinine 1.8, calcium 9.1, total protein 6.7, and albumin 3.4.  Last chemistry performed 12/26/00 showed sodium 134, potassium 5.3, chloride 101, CO2 26, glucose 108, BUN 29, creatinine 1.8, and calcium 8.5.  Liver functions were normal.  Total bilirubin was normal.  Amylase is 106.  Uric acid 5.0. Lipid profile shows elevated cholesterol at 266, triglycerides  217, HDL 46,  and LDL high at 177.  UA performed 12/19/00 showed moderate hemoglobin with negative leukocytes esterase and 0.2 red blood cells.  UA from 11/4 remains pending.  Blood cultures performed 10/28, 10/29, and also a second one done on 10/29 showed no growth in five days.  Urine culture 10/28 showed greater than 100,000 colonies of multiple species of bacteria.  Urine culture performed 11/4 showed no growth one day.  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. [facility], possibly left handed at birth.  He presented to the emergency room 12/13/00 at 1 oclock in the afternoon.  Around 12:15 that day he had gotten out of the shower and had sudden onset of slurred speech and diplopia that was noted by his wife who is a Engineer, civil (consulting).  She called EMS, and the patient was transported to the hospital for further workup.  The patient had had a two to three month history of increasing visual migraines with some visual phenomenon.  Yesterday he had a slight episode of dizziness and pain over the right eye but no visual disturbance.  He had talked to Dr. Patty Sermons at that time.  He did not think it was vascular.  He recovered without any difficulty from that.  In the emergency room, the patient was awake and alert.  He was oriented in person, place and time.  He was able  to repeat and speak with normal language content, but was quite severely dysarthric and hard to understand.  He did follow commands.  Further examination revealed that patient did set the criteria for TPA, but time was critical at 2 hours and 45 minutes.  The patient and wife agreed to accept treatment and IV TPA was given.  The patient was admitted to ICU for further follow up.  Repeat CT 24 hours after administration of IV TPA revealed no change.  IV heparin was begun at 12/14/00 at 3 p.m.  Therapy evaluation had begun with diet of dysphagia I, thick liquids given.   Therapy evaluation were instituted. Acute strokes were found in the right pontine, right posterior cerebellar, and right PCA distribution.  The patient also was found to have an occluded right vert and distal stenosis at the left vert.  The patient was dysarthric with significant left hemiparesis  and ataxia.  A PM&R consult was obtained.  The patient passed his modified barium swallow and was then progressed to dysphagia III nectar thick diet.  Coumadin was started 12/16/00. PM&R  agreed for CIR admission as bed became available on 10/25.  On the evening of 12/17/00 the patient became nauseated and vomiting undigested food.  It was very difficult to control.  CT of the head done at 7 oclock showed some cerebellar hemorrhage.  On 10/27, the patient was more lethargic than the day before, but aroused easily.  Speech remained the same, but he had developed hiccups.  Later that evening, the patient remained neurological stable.  On 12/19/00 his wife noted increasing urinary frequency and constipation.  The patient was disimpacted for a large amount of solid stool.  White blood cells had increased to 11.1 and urine culture showed greater than 100,000 bacteria. The patient was started on IV Cipro for his urinary tract infection.  The patient remained stable for the next several days fighting only nausea and inability to take NPO secondary to the nausea.  UTI symptoms resolved on Cipro, and the patient was transferred to non telemetry bed on 3000 on 12/22/00.  Blood pressure was variable throughout the hospitalization with adjustments made in his medications.  Cipro was stopped 12/25/00.  By November 5th, the patient had some urinary retention with inability to void.  The Foley was replaced, and patients GU doctor, Dr. Aldean Ast, was called for consult. The patient was transferred to rehab for further therapy.  CONDITION AT DISCHARGE:  Unable to void.  Blood pressure 150/100.  Good appetite,  taking in about 95% of his meals.  Alert and oriented x 3.  Follows commands.  No field cut.  Some dysarthria. In gaze nystagmus especially to the right.  No hiccups. Mild left central ___________.  Excellent strength. Intention tremors with finger to nose bilaterally.  Gait not tested. Decreased deep tendon reflexes.  PLAN: 1. The patient was discharged to inpatient rehab 12/27/00. 2. Urology consult pending. 3. No antiplatelets at present. 4. Follow up with Dr. Sandria Manly in three to four weeks after discharge from rehab. Dictated by:   Annie Main, N.P. Attending Physician:  Faith Rogue T DD:  12/27/00 TD:  12/29/00 Job: [id number]

## 2010-07-10 NOTE — Discharge Summary (Signed)
[place]  Patient:    Craig [name], Craig [name] Visit Number: [id number] MRN: [id number]          Service Type: [facility] Location: [id number] Attending Physician:  Faith Rogue T Dictated by:   Junie Bame, P.A. Admit Date:  12/27/2000 Discharge Date: 01/28/2001   CC:         Candy Sledge, M.D.  Thomas A. Patty Sermons, M.D.  Rozanna Boer., M.D.   Discharge Summary  DISCHARGE DIAGNOSES: 1. Right cerebellar/posterior inferior cerebellar artery/right pons infarct. 2. Status post cerebellar hemorrhagic cerebrovascular accident. 3. Hypertension. 4. Urinary retention secondary to neurogenic bladder. 5. Hypokalemia secondary to diuretics.  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  MRI/MRA revealed acute infarction of right cerebellar/PICA/and acute infarct of the right pons. Distal left vertebral stenosis occlusion of right vertebral artery.  The patient was placed on Coumadin for CVA prophylaxis.  Long hospital course was significant for UTI, urinary retention, severe hypertension, hiccups, and transformation hemorrhagic CVA on December 18, 2000.  Therefore, Coumadin would be continued.  PT report at that time indicated that patient could transfer with moderate assist, patient 70% steps to the chair.  Neurology, Candy Sledge, M.D., and patient had a urology consult as well.  PAST MEDICAL HISTORY:  Significant for hypertension, sinus disease, TIA, hypercholesterolemia, BPH, and atrial fibrillation.  PAST SURGICAL HISTORY:  None.  PRIMARY CARE Sarabi Sockwell:  Clovis Pu. Patty Sermons, M.D.  ALLERGIES:  No known drug allergies.  SOCIAL HISTORY:  The patient is married and lives in two-level home with his wife.  No steps at entry.  He was independent prior to  admission.  Occasional alcohol and tobacco abuse.  He is a retired [employer] scientist.  REVIEW OF SYSTEMS:  Significant for headache, hypotension, and arrhythmia.  FAMILY HISTORY:  Father is deceased with history of MI, mother is deceased with heart disease.  HOSPITAL COURSE:  Mr. Christan Defranco was admitted to [facility] on December 27, 2000, for comprehensive inpatient rehabilitation and received more than three hours of PT and OT.  The patient got off to a slow progress during his first few days of rehab but eventually did fairly well.  At the beginning of admission the patient was noted to have the Foley and was on Flomax 0.4 mg p.o. q.h.s.  He was followed by urologist, Rozanna Boer., M.D., during his entire stay.  Foley was finally discontinued prior to discharge. Medications were adjusted.  The patient was added on Urecholine _____ mg t.i.d. and with adjustment of these medications, urinary retention did improve.  The patient seemed very depressed and had a flat affect during his first few days of rehab.  After adjustment of psychostimulant medication, Ritalin, the patients mood and attention did improve.  He was also placed on Remeron 15 mg q.h.s. as needed for depression and appetite stimulation.  He was started on potassium, K-Dur 20 mEq p.o. q.d. for hypokalemia secondary to the use of diuretic.  On January 02, 2001, due to decreased p.o. intake and slightly elevated BUN, the patient was started on IV fluids with half normal saline 75 cc an hour.  After a couple of days of fluids, BUN began to normalize and therefore fluids were discontinued on January 04, 2001.  On January 12, 2001, after Foley was discontinued, the patient was  noted to have a urinary tract infection.  Therefore, he was started on Macrodantin 50 mg q.i.d. for seven days.  There was no other significant medical complications occurred throughout his stay in the rehab.  Due to weather conditions,  Mr. Julien Girt discharge had to be postponed as the patients family did not have electricity.  Latest labs indicated that his potassium was 3.7, chloride 102, CO2 28, glucose 115, BUN 17, creatinine 1.6, sodium 140.  TSH level was performed on December 29, 2000, was 1.192.  Prealbumin was 38.3 on January 04, 2001.  The latest white blood cell count was 11.4, hemoglobin 13.1, hematocrit 38.2 and platelet count 341.  Latest urine culture on January 26, 2001, was demonstrated greater than 100,000 colonies, no uropathogens are isolated.  The patient did have a positive urine culture on January 10, 2001, with greater than 100,000 colonies of Staphylococcus species as the reason for the addition of the Macrodantin to his medications.  At the time of discharge, all vitals were stable.  His blood pressure was 139/72, respiratory rate was 20, pulse 63, temperature 98.5.  PT report at that time indicated that the patient was ambulating with minimal assist 120 feet with _______ or rolling walker, could transfer sit to stand with close supervision.  He could perform his ADLs with minimal assist. Regular diet and thin liquids.  He was discharged home with his wife.  DISCHARGE MEDICATIONS:  1. Ziac resume home dose.  2. Hyzaar 50/12.5 resume home dose.  3. Flomax 0.4 mg twice daily.  4. Urecholine 10 mg three times daily.  5. K-Dur 20 mEq daily.  6. Welchol three times daily.  7. He is not to take his aspirin and Plavix. Do not take for now.  8. Proscar 5 mg daily.  9. Multivitamins daily. 10. Remeron 50 mg at night.  ACTIVITY:  He is to use his wheelchair, no driving, use walker, no drinking.  DIET:  Low salt.  FOLLOW-UP:  PT, OT and speech at [facility]. [facility] on January 30, 2001, at 8:30 to 12 p.m.  He is to follow up with  Rozanna Boer., M.D., within one to two weeks, follow up with Deanna Artis. Sharene Skeans, M.D.  He is to call for an  appointment.  He is to follow up with Faith Rogue, M.D., on March 01, 2001, at 12:30 and to follow up with [name] A. Brackbill, M.D., within four to six weeks. Dictated by:   Junie Bame, P.A. Attending Physician:  Faith Rogue T DD:  02/06/01 TD:  02/07/01 Job: [id number]

## 2010-07-10 NOTE — Op Note (Signed)
[facility]  Patient:    Craig [name], Craig [name]                       MRN: [id number] Proc. Date: 09/22/00 Attending:  Florencia Reasons, M.D. CC:         Clovis Pu. Patty Sermons, M.D.   Operative Report  PROCEDURE:  Colonoscopy.  ENDOSCOPIST:  Florencia Reasons, M.D.  INDICATIONS:  Screening for colon cancer in an asymptomatic 77 year old gentleman.  FINDINGS:  Normal exam to the terminal ileum  DESCRIPTION OF PROCEDURE:  The nature, purpose, and risks of the procedure had been discussed with the patient who provided written consent.  Sedation was fentanyl 50 mcg and Versed 7 mg IV without arrhythmias or desaturation. The Olympus adult video colonoscope was advanced to the terminal ileum without significant difficulty, and pullback was then performed.  The quality of the prep was adequate, and it is felt that all areas were seen.  This was a normal examination.  No polyps, cancer, colitis, vascular malformations, or diverticulosis were noted.  No biopsies were obtained.  The patient tolerated the procedure well, and there were no apparent complications.  IMPRESSION:  Normal screening colonoscopy.  PLAN:  Consider screening flexible sigmoidoscopy in five years with full colonoscopy five years thereafter if the patient remains in good general health.DD:  09/22/00 TD:  09/22/00 Job: [id number]

## 2010-07-10 NOTE — H&P (Signed)
[name]  Patient:    Craig [name], Craig [name] Visit Number: [id number] MRN: [id number]          Service Type: MED Location: [phone] Attending Physician:  Enos Fling Dictated by:   Gustavus Messing Orlin Hilding, M.D. Admit Date:  12/13/2000                           History and Physical  CHIEF COMPLAINT: Slurred speech.  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. [employer] [facility], possibly left-handed at birth, with a history of hypertension, hypercholesterolemia, previous TIA with diplopia about three years ago, and history of migrainous visual accompaniment without headache.  Within the last two months he has had three to four of his visual migraines with some visual phenomenon, without headache.  Yesterday morning he had a slight episode of dizziness and pain over his right eye.  He had not had any visual disturbance at that time, and language disturbance, weakness, numbness, incoordination.  He talked to Dr. Patty Sermons, who did not think it was vascular.  He has had sinus problems in the past and they attributed it to that.  He recovered without any difficulty from that yesterday and went about his business, went out and participated in church activities, etc., with no symptoms of any sort.  This morning he had a brief 15 minute episode of his typical visual aura from his visual migraine.  That cleared and he was again at baseline.  Around 12:15 p.m. he got out of the shower with sudden onset of slurred speech and diplopia that has persisted.  REVIEW OF SYSTEMS: Negative for any weakness, numbness.  No chest pain, no shortness of breath.  No palpitations.  No pain anywhere.  PAST MEDICAL HISTORY:  1. Hypertension.  2. Sinus disease.  He has had sinus surgery.  3. History of very mild chronic renal insufficiency.  4. Benign prostatic hypertrophy.  5. Migraine aura without  headache.  6. Hypercholesterolemia.  7. Obesity.  8. TIA.  9. He had an episode of spontaneous atrial fibrillation in 1994 which     converted spontaneously and, to the best of anybodys knowledge, has     not returned.  PRIMARY PHYSICIAN/CARDIOLOGIST: [name] A. Patty Sermons, M.D.  MEDICATIONS:  1. Welchol three tablets b.i.d.  2. Ziac 10/6.25 mg q.d.  3. Hyzaar 50/12.5 mg q.d.  4. Aspirin 325 mg q.d.  5. Plavix 75 mg q.d.  6. Norvasc 5 mg q.d.  ALLERGIES: No known drug allergies.  SOCIAL HISTORY: He is married.  He is retired Marine scientist.  Does not smoke. Occasional alcohol use.  FAMILY HISTORY: Positive for father having died at age 18 of an MI.  There is a sister who has atrial fibrillation.  Mother also died of some kind of heart disease.  PHYSICAL EXAMINATION:  VITAL SIGNS: Temperature 97.8 degrees, blood pressure 164/79, pulse 56, respirations 20.  Saturation 99% on room air.  HEENT: Head normocephalic, atraumatic.  NECK: Supple without bruits.  HEART: Regular rate and rhythm.  LUNGS: Clear to auscultation.  EXTREMITIES: Without edema.  ABDOMEN: Positive bowel sounds, soft, nontender.  NEUROLOGIC: Mental status, he is awake and alert.  He is oriented to month and place.  He is able to repeat and speak with normal language content, but is quite severely dysarthric and hard to understand.  He will follow commands. Cranial nerves, pupils are equal and  reactive.  Visual fields appear full to threat.  He has an internuclear ophthalmoplegia on the right with nystagmus on rightward gaze with both eyes.  On left gaze he has nystagmus in the left eye and the right eye does not adduct.  He appears to have normal facial sensation.  He has a slight left facial droop, quite severe dysarthria with a droopy palate, slight tongue deviation to the left.  Motor examination, he has normal bulk, tone, and strength to confrontation, but is quite clumsy with the left upper extremity  with some drift.  Lower extremity, no drift, normal strength.  Gait was was checked.  Reflexes are 1+.  Downgoing toes to plantar stimulation.  Coordination, it is unclear if he has some dysmetria on finger-to-nose.  He has diplopia and that makes it a little bit harder.  He past-points quite a bit on both sides.  Heel-to-shin is intact.  Sensory examination is normal.  LABORATORY DATA: CT scan of the brain is essentially normal.  There is no bleed.  EKG shows sinus bradycardia.  Sodium 138, potassium 3.6, chloride 103, CO2 29, BUN 25, creatinine 1.8, glucose 131.  SGOT 19, SGPT 18.  Alkaline phosphatase 64, bilirubin 0.8, calcium 9.1.  WBC 9.9, hemoglobin 15.9, hematocrit 41, platelets 238,000.  PT 13.3, INR 1.0.  IMPRESSION: Acute right pons stroke clinically, with a negative CT.  He has right eye and left facial weakness, left arm clumsiness, and severe dysarthria.  PLAN: Fits criteria for TPA, but is time critical at two hours and 45 minutes. The patient and wife consent.  Will give TPA, although we are at the edge of the time limit.  Will proceed with MRI, carotid Doppler, and 2D echocardiogram to further evaluate. Dictated by:   Gustavus Messing Orlin Hilding, M.D. Attending Physician:  Enos Fling DD:  12/13/00 TD:  12/14/00 Job: [id number]

## 2010-07-10 NOTE — Consult Note (Signed)
   NAME:  Craig [name], Craig [name]                        ACCOUNT NO.:  [id number]   MEDICAL RECORD NO.:  [id number]                   PATIENT TYPE:  EMS   LOCATION:  MINO                                 FACILITY:  [facility]   PHYSICIAN:  Hermelinda Medicus, M.D.                DATE OF BIRTH:  01/31/1925   DATE OF CONSULTATION:  DATE OF DISCHARGE:                                   CONSULTATION   REFERRING PHYSICIAN:  Dr. Doris Cheadle. Radford Pax.   HISTORY OF PRESENT ILLNESS:  This patient has been seen by me in the office  for epistaxis.  He has a septal deviation to the right, epistaxis on the  right, cauterized his nose with silver nitrate and he did reasonably well,  until this morning.  He had a quite severe bleed and was brought into the  emergency room, no bleeding in the emergency room, but on examination after  anesthesia with lidocaine, we could see a mid Hesselbach's triangle bleeder  which bled slightly more; we cauterized this with silver nitrate.  Considering he has had -- according to his wife -- five different episodes  of bleeding and he has been on aspirin also in the past, I felt we would  place a 10-cm Merocel pack; we did this without difficulty and he had no  further epistaxis.   DISCHARGE INSTRUCTIONS:  The patient is to go home, follow me in the office  next week and just take it easy, no heavy lifting, not to get excited.  His  blood pressure is 140/90 and his medications were reviewed and he is to stop  using the Flonase, as this may have been part of the problem; he is also to  discontinue his aspirin.  He has had a mild stroke just recently over the  past year and has been on aspirin, but he is to be discontinued at this  time, until we get this under control.   DIAGNOSIS:  Right anterior epistaxis, severe.  I placed a right Merocel pack  after cauterization.                                               Hermelinda Medicus, M.D.    JC/MEDQ  D:  07/19/2002  T:  07/19/2002  Job:   [id number]   cc:   [name], M.D.  [address]  [place]  [place] [address]  Fax: [phone]   Reginia Naas.D.

## 2010-07-16 ENCOUNTER — Ambulatory Visit (INDEPENDENT_AMBULATORY_CARE_PROVIDER_SITE_OTHER): Payer: Medicare Other | Admitting: *Deleted

## 2010-07-16 DIAGNOSIS — I4891 Unspecified atrial fibrillation: Secondary | ICD-10-CM

## 2010-07-16 LAB — POCT INR: INR: 1.4

## 2010-07-21 ENCOUNTER — Other Ambulatory Visit: Payer: Self-pay | Admitting: Cardiology

## 2010-07-21 MED ORDER — FENOFIBRATE 145 MG PO TABS: 145.0000 mg | ORAL_TABLET | Freq: Every day | ORAL | Status: DC

## 2010-07-21 NOTE — Telephone Encounter (Signed)
Med refill

## 2010-07-23 ENCOUNTER — Other Ambulatory Visit: Payer: Self-pay | Admitting: *Deleted

## 2010-07-23 DIAGNOSIS — E785 Hyperlipidemia, unspecified: Secondary | ICD-10-CM

## 2010-07-24 ENCOUNTER — Ambulatory Visit (INDEPENDENT_AMBULATORY_CARE_PROVIDER_SITE_OTHER): Payer: Medicare Other | Admitting: *Deleted

## 2010-07-24 ENCOUNTER — Other Ambulatory Visit (INDEPENDENT_AMBULATORY_CARE_PROVIDER_SITE_OTHER): Payer: Medicare Other | Admitting: *Deleted

## 2010-07-24 ENCOUNTER — Encounter: Payer: Self-pay | Admitting: *Deleted

## 2010-07-24 DIAGNOSIS — I4891 Unspecified atrial fibrillation: Secondary | ICD-10-CM

## 2010-07-24 DIAGNOSIS — E785 Hyperlipidemia, unspecified: Secondary | ICD-10-CM

## 2010-07-24 LAB — BASIC METABOLIC PANEL
BUN: 47 mg/dL — ABNORMAL HIGH (ref 6–23)
CO2: 32 mEq/L (ref 19–32)
Chloride: 103 mEq/L (ref 96–112)
Creatinine, Ser: 2.1 mg/dL — ABNORMAL HIGH (ref 0.4–1.5)
Potassium: 4.6 mEq/L (ref 3.5–5.1)

## 2010-07-24 LAB — HEPATIC FUNCTION PANEL
Albumin: 3.1 g/dL — ABNORMAL LOW (ref 3.5–5.2)
Alkaline Phosphatase: 46 U/L (ref 39–117)
Bilirubin, Direct: 0.2 mg/dL (ref 0.0–0.3)
Total Protein: 6.3 g/dL (ref 6.0–8.3)

## 2010-07-24 LAB — LIPID PANEL
Cholesterol: 121 mg/dL (ref 0–200)
LDL Cholesterol: 56 mg/dL (ref 0–99)
Total CHOL/HDL Ratio: 3
Triglycerides: 117 mg/dL (ref 0.0–149.0)
VLDL: 23.4 mg/dL (ref 0.0–40.0)

## 2010-07-27 ENCOUNTER — Ambulatory Visit (INDEPENDENT_AMBULATORY_CARE_PROVIDER_SITE_OTHER): Payer: Medicare Other | Admitting: Cardiology

## 2010-07-27 ENCOUNTER — Encounter: Payer: Self-pay | Admitting: Cardiology

## 2010-07-27 ENCOUNTER — Encounter: Payer: Self-pay | Admitting: *Deleted

## 2010-07-27 DIAGNOSIS — I69993 Ataxia following unspecified cerebrovascular disease: Secondary | ICD-10-CM

## 2010-07-27 DIAGNOSIS — G464 Cerebellar stroke syndrome: Secondary | ICD-10-CM | POA: Insufficient documentation

## 2010-07-27 DIAGNOSIS — E7800 Pure hypercholesterolemia, unspecified: Secondary | ICD-10-CM | POA: Insufficient documentation

## 2010-07-27 DIAGNOSIS — I4891 Unspecified atrial fibrillation: Secondary | ICD-10-CM

## 2010-07-27 DIAGNOSIS — C61 Malignant neoplasm of prostate: Secondary | ICD-10-CM | POA: Insufficient documentation

## 2010-07-27 DIAGNOSIS — N289 Disorder of kidney and ureter, unspecified: Secondary | ICD-10-CM

## 2010-07-27 DIAGNOSIS — I119 Hypertensive heart disease without heart failure: Secondary | ICD-10-CM | POA: Insufficient documentation

## 2010-07-27 NOTE — Assessment & Plan Note (Signed)
The patient has not been expressing any increased chest pain or shortness of breath.  He has had chronic mild edema and we are increasing his furosemide from every other day to every day to help with this.  His wife feels that when his feet are heavy with edema that he tends to be more likely to trip and fall.

## 2010-07-27 NOTE — Progress Notes (Signed)
Craig [name] Date of Birth:  07-Jan-1925 [facility] [address]   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  He has a complex past medical history.  He has a history of essential hypertension and history of hypercholesterolemia.  He has had a previous stroke which has affected his balance and his equilibrium.  He does not have any history of known coronary artery disease.  He was found to have metastatic prostate cancer in March of 2011.  He is on Lupron injections.  He has had severe back pain in the past secondary to compression fracture at T11 and underwent successful kyphoplasty for this.  Dr. Aldean [name] is his urologist.  the patient is on long-term Coumadin for his chronic atrophic fibrillation.  He has not had any new cardiac symptoms recently.  Current Outpatient Prescriptions  Medication Sig Dispense Refill  . aspirin 81 MG tablet Take 81 mg by mouth daily.        Marland Kitchen atorvastatin (LIPITOR) 10 MG tablet Take 1 tablet by mouth Daily.      . bisoprolol-hydrochlorothiazide (ZIAC) 10-6.25 MG per tablet Take 1 tablet by mouth Daily.      . calcium carbonate (OS-CAL) 600 MG TABS Take 600 mg by mouth daily.        Tery Sanfilippo Calcium (STOOL SOFTENER PO) Take 1 tablet by mouth daily.        . fenofibrate (TRICOR) 145 MG tablet Take 1 tablet (145 mg total) by mouth daily.  30 tablet  11  . furosemide (LASIX) 20 MG tablet Take 1 tablet (20 mg total) by mouth daily.  30 tablet  11  . HYDROcodone-acetaminophen (NORCO) 5-325 MG per tablet Take 1 tablet by mouth at bedtime.        Marland Kitchen Leuprolide Acetate (LUPRON DEPOT IM) Inject into the muscle every 6 (six) months.        Marland Kitchen losartan-hydrochlorothiazide (HYZAAR) 100-25 MG per tablet Take 1 tablet by mouth Daily.      . multivitamin (THERAGRAN) per tablet Take 1 tablet by mouth daily. ( with  B - Complex )       . Omega-3 Fatty Acids (FISH OIL PO) Take 1 capsule by mouth daily. ( Mega Red )       . potassium chloride SA (K-DUR,KLOR-CON) 20 MEQ tablet Take 1 tablet (20 mEq total) by mouth daily.  30 tablet  11  . warfarin (COUMADIN) 2.5 MG tablet 1 tablet x 4 days and 1/2 tablet x 3 days or as directed  30 tablet  11  . ZETIA 10 MG tablet Take 1 tablet by mouth Daily.        Allergies  Allergen Reactions  . Lipitor (Atorvastatin Calcium)     HIGH DOSES  . Pravachol     HIGH DOSES  . Zocor (Simvastatin - High Dose)     Patient Active Problem List  Diagnoses  . Atrial fibrillation  . CVA, old, ataxia  . Hypercholesterolemia  . Prostate cancer  . Benign hypertensive heart disease without heart failure  . Renal insufficiency    History  Smoking status  . Former Smoker  . Types: Cigarettes  . Quit date: 02/23/1948  Smokeless tobacco  . Not on file    History  Alcohol Use No    No  family history on file.  Review of Systems: The patient denies any heat or cold intolerance.  No weight gain or weight loss.  The patient denies headaches or blurry vision.  There is no cough or sputum production.  The patient denies dizziness.  There is no hematuria or hematochezia.  The patient denies any muscle aches or arthritis.  The patient denies any rash.  The patient denies frequent falling or instability.  There is no history of depression or anxiety.  All other systems were reviewed and are negative.   Physical Exam: Filed Vitals:   07/27/10 0844  BP: 140/90  Pulse: 77  The general appearance reveals a well-developed well-nourished elderly gentleman in no distress.Pupils equal and reactive.   Extraocular Movements are full.  There is no scleral icterus.  The mouth and pharynx are normal.  The neck is supple.  The carotids reveal no bruits.  The jugular venous pressure is normal.  The thyroid is not enlarged.  There is no lymphadenopathy.The chest is clear to percussion and  auscultation. There are no rales or rhonchi. Expansion of the chest is symmetrical.The precordium is quiet.  The first heart sound is normal.  The second heart sound is physiologically split.  There is no murmur gallop rub or click.  There is no abnormal lift or heave.  The rhythm is irregular.The abdomen is soft and nontender. Bowel sounds are normal. The liver and spleen are not enlarged. There Are no abdominal masses. There are no bruits.  Extremities reveal 2+ bilateral soft pitting edema of the ankles and pretibial areas.  Pedal pulses are present.    Assessment / Plan: We reviewed his recent labs which are satisfactory.  To deal with his peripheral edema we are increasing his furosemide to 20 mg daily.  Recheck a pro time in 2 weeks and recheck for office visit and lab work in 3 months.

## 2010-07-27 NOTE — Assessment & Plan Note (Signed)
The patient has a history of hypercholesterolemia.  He is on Lipitor, TriCor, and Zetia.  He is trying to watch his diet.  His lipids were reviewed and are satisfactory

## 2010-07-27 NOTE — Assessment & Plan Note (Signed)
The patient has a history of chronic atrial fibrillation.  He has not had any new TIA symptoms.  His INR has been subtherapeutic because of recent adjustments in his other medications and recent antibiotics.  We will plan to recheck a pro time in 2 weeks.

## 2010-07-27 NOTE — Assessment & Plan Note (Signed)
The patient has a history of a previous thrombotic stroke.  His balance has been poor as a result of the stroke.  He tends to trip and he has had 2 falls since last visit.  He does have a trainer who comes into the home twice a week to help with balance and strengthening issues.

## 2010-08-06 ENCOUNTER — Ambulatory Visit (INDEPENDENT_AMBULATORY_CARE_PROVIDER_SITE_OTHER): Payer: Medicare Other | Admitting: *Deleted

## 2010-08-06 DIAGNOSIS — I4891 Unspecified atrial fibrillation: Secondary | ICD-10-CM

## 2010-08-06 LAB — POCT INR: INR: 2.5

## 2010-09-03 ENCOUNTER — Encounter: Payer: Medicare Other | Admitting: *Deleted

## 2010-09-04 ENCOUNTER — Encounter: Payer: Medicare Other | Admitting: *Deleted

## 2010-09-07 ENCOUNTER — Ambulatory Visit (INDEPENDENT_AMBULATORY_CARE_PROVIDER_SITE_OTHER): Payer: Medicare Other | Admitting: *Deleted

## 2010-09-07 ENCOUNTER — Other Ambulatory Visit: Payer: Self-pay | Admitting: Dermatology

## 2010-09-07 DIAGNOSIS — I4891 Unspecified atrial fibrillation: Secondary | ICD-10-CM

## 2010-09-07 LAB — POCT INR: INR: 2

## 2010-09-09 ENCOUNTER — Telehealth: Payer: Self-pay | Admitting: Cardiology

## 2010-09-09 NOTE — Telephone Encounter (Signed)
Scheduled appointment

## 2010-09-09 NOTE — Telephone Encounter (Signed)
WIFE NEEDS TO SW SOMEONE ABOUT PT GETTING COUMADIN CHECKED AGAIN. HAS BEEN ON ABX.

## 2010-09-09 NOTE — Telephone Encounter (Signed)
CALL BACK AT [phone]

## 2010-09-11 ENCOUNTER — Ambulatory Visit (INDEPENDENT_AMBULATORY_CARE_PROVIDER_SITE_OTHER): Payer: Medicare Other | Admitting: *Deleted

## 2010-09-11 DIAGNOSIS — I4891 Unspecified atrial fibrillation: Secondary | ICD-10-CM

## 2010-09-11 LAB — POCT INR: INR: 2.2

## 2010-09-18 ENCOUNTER — Ambulatory Visit (INDEPENDENT_AMBULATORY_CARE_PROVIDER_SITE_OTHER): Payer: Medicare Other | Admitting: *Deleted

## 2010-09-18 DIAGNOSIS — I4891 Unspecified atrial fibrillation: Secondary | ICD-10-CM

## 2010-10-05 ENCOUNTER — Encounter: Payer: Medicare Other | Admitting: *Deleted

## 2010-10-16 ENCOUNTER — Ambulatory Visit (INDEPENDENT_AMBULATORY_CARE_PROVIDER_SITE_OTHER): Payer: Medicare Other | Admitting: *Deleted

## 2010-10-16 DIAGNOSIS — I4891 Unspecified atrial fibrillation: Secondary | ICD-10-CM

## 2010-10-19 ENCOUNTER — Other Ambulatory Visit: Payer: Self-pay | Admitting: *Deleted

## 2010-10-19 DIAGNOSIS — E785 Hyperlipidemia, unspecified: Secondary | ICD-10-CM

## 2010-10-19 MED ORDER — ATORVASTATIN CALCIUM 10 MG PO TABS: 10.0000 mg | ORAL_TABLET | Freq: Every day | ORAL | Status: DC

## 2010-10-19 NOTE — Telephone Encounter (Signed)
Advised patient to discontinue Tricor per  Dr. Patty Sermons and contraindication.

## 2010-11-03 ENCOUNTER — Other Ambulatory Visit: Payer: Self-pay | Admitting: Cardiology

## 2010-11-03 DIAGNOSIS — I4891 Unspecified atrial fibrillation: Secondary | ICD-10-CM

## 2010-11-03 DIAGNOSIS — E7800 Pure hypercholesterolemia, unspecified: Secondary | ICD-10-CM

## 2010-11-03 DIAGNOSIS — I119 Hypertensive heart disease without heart failure: Secondary | ICD-10-CM

## 2010-11-10 ENCOUNTER — Other Ambulatory Visit (INDEPENDENT_AMBULATORY_CARE_PROVIDER_SITE_OTHER): Payer: Medicare Other | Admitting: *Deleted

## 2010-11-10 DIAGNOSIS — E7800 Pure hypercholesterolemia, unspecified: Secondary | ICD-10-CM

## 2010-11-10 DIAGNOSIS — I4891 Unspecified atrial fibrillation: Secondary | ICD-10-CM

## 2010-11-10 DIAGNOSIS — I119 Hypertensive heart disease without heart failure: Secondary | ICD-10-CM

## 2010-11-10 LAB — CBC WITH DIFFERENTIAL/PLATELET
Basophils Absolute: 0.1 10*3/uL (ref 0.0–0.1)
Eosinophils Absolute: 0.5 10*3/uL (ref 0.0–0.7)
HCT: 37.2 % — ABNORMAL LOW (ref 39.0–52.0)
Lymphs Abs: 2.1 10*3/uL (ref 0.7–4.0)
MCV: 95.4 fl (ref 78.0–100.0)
Monocytes Absolute: 0.7 10*3/uL (ref 0.1–1.0)
Neutrophils Relative %: 47.8 % (ref 43.0–77.0)
Platelets: 186 10*3/uL (ref 150.0–400.0)
RDW: 15 % — ABNORMAL HIGH (ref 11.5–14.6)

## 2010-11-10 LAB — BASIC METABOLIC PANEL
BUN: 56 mg/dL — ABNORMAL HIGH (ref 6–23)
CO2: 29 mEq/L (ref 19–32)
Chloride: 103 mEq/L (ref 96–112)
Glucose, Bld: 106 mg/dL — ABNORMAL HIGH (ref 70–99)
Potassium: 3.8 mEq/L (ref 3.5–5.1)

## 2010-11-10 LAB — LIPID PANEL
Cholesterol: 160 mg/dL (ref 0–200)
Triglycerides: 96 mg/dL (ref 0.0–149.0)

## 2010-11-10 LAB — HEPATIC FUNCTION PANEL
ALT: 25 U/L (ref 0–53)
AST: 26 U/L (ref 0–37)
Albumin: 3.1 g/dL — ABNORMAL LOW (ref 3.5–5.2)
Total Bilirubin: 0.7 mg/dL (ref 0.3–1.2)
Total Protein: 6.1 g/dL (ref 6.0–8.3)

## 2010-11-12 ENCOUNTER — Ambulatory Visit (INDEPENDENT_AMBULATORY_CARE_PROVIDER_SITE_OTHER): Payer: Medicare Other | Admitting: Cardiology

## 2010-11-12 ENCOUNTER — Encounter: Payer: Self-pay | Admitting: Cardiology

## 2010-11-12 ENCOUNTER — Ambulatory Visit (INDEPENDENT_AMBULATORY_CARE_PROVIDER_SITE_OTHER): Payer: Medicare Other | Admitting: *Deleted

## 2010-11-12 VITALS — BP 140/78 | HR 60 | Wt 203.0 lb

## 2010-11-12 DIAGNOSIS — I4891 Unspecified atrial fibrillation: Secondary | ICD-10-CM

## 2010-11-12 DIAGNOSIS — I119 Hypertensive heart disease without heart failure: Secondary | ICD-10-CM

## 2010-11-12 DIAGNOSIS — C61 Malignant neoplasm of prostate: Secondary | ICD-10-CM

## 2010-11-12 DIAGNOSIS — I69993 Ataxia following unspecified cerebrovascular disease: Secondary | ICD-10-CM

## 2010-11-12 DIAGNOSIS — E7800 Pure hypercholesterolemia, unspecified: Secondary | ICD-10-CM

## 2010-11-12 NOTE — Assessment & Plan Note (Signed)
The patient has had no new neurologic symptoms.  He remains alert.  He enjoys playing bridge.  He still has very slightly slurred speech secondary to the remote stroke

## 2010-11-12 NOTE — Assessment & Plan Note (Signed)
The patient is in chronic atrial fibrillation.  He is on Coumadin.  His INR is subtherapeutic and we are increasing his Coumadin to 5 mg Wednesday and Saturday and 2.5 mg on the other 5 days.  He has not had any recent TIA symptoms.

## 2010-11-12 NOTE — Assessment & Plan Note (Signed)
The patient has not been experiencing any symptoms of congestive heart failure.  No angina pectoris.  He does have a history of mild peripheral edema which has been helped with support hose.

## 2010-11-12 NOTE — Assessment & Plan Note (Signed)
The patient reports that his last PSA had risen sound and at the urologist are following this closely.  His new urologist is Dr. Mena Goes

## 2010-11-12 NOTE — Progress Notes (Signed)
Craig [name] Date of Birth:  10/05/1924 [facility] / [facility] [address]   [address] [place], [place]  [address] [phone]           Fax   [phone]  History of Present Illness: This pleasant 77 year old with prior physician is seen for a scheduled 3 month followup office visit.  He has a complex past medical history.  He's had a history of essential hypertension and history of hypercholesterolemia.  He is also had a previous thrombotic stroke which has affected his balance and dysequilibrium.  He does not have any history of ischemic heart disease.  He was found to have metastatic prostate cancer in March 2011 and is on Lupron injections.  His new urologist is Dr. Kathie Rhodes. [place].  Patient is on long-term Coumadin for chronic atrial fibrillation.  He has a history of hypercholesterolemia and is on statin therapy and is no longer on TriCor  Current Outpatient Prescriptions  Medication Sig Dispense Refill  . aspirin 81 MG tablet Take 81 mg by mouth daily.        Marland Kitchen atorvastatin (LIPITOR) 10 MG tablet Take 1 tablet (10 mg total) by mouth daily.  30 tablet  11  . bisoprolol-hydrochlorothiazide (ZIAC) 10-6.25 MG per tablet Take 1 tablet by mouth Daily.      . calcium carbonate (OS-CAL) 600 MG TABS Take 600 mg by mouth daily. Taking 1200 daily      . Denosumab (XGEVA [place]) Inject into the skin. monthly       . Docusate Calcium (STOOL SOFTENER PO) Take 1 tablet by mouth 2 (two) times daily.       . furosemide (LASIX) 20 MG tablet Take 1 tablet (20 mg total) by mouth daily.  30 tablet  11  . Leuprolide Acetate (LUPRON DEPOT IM) Inject into the muscle every 6 (six) months.        Marland Kitchen losartan-hydrochlorothiazide (HYZAAR) 100-25 MG per tablet Take 1 tablet by mouth Daily.      . multivitamin (THERAGRAN) per tablet Take 1 tablet by mouth daily. ( with B - Complex )       . Omega-3 Fatty Acids (FISH OIL PO) Take 1 capsule by mouth daily. ( Mega Red )       . potassium chloride SA  (K-DUR,KLOR-CON) 20 MEQ tablet Take 1 tablet (20 mEq total) by mouth daily.  30 tablet  11  . warfarin (COUMADIN) 2.5 MG tablet 1 tablet x 4 days and 1/2 tablet x 3 days or as directed  30 tablet  11  . ZETIA 10 MG tablet Take 1 tablet by mouth Daily.        Allergies  Allergen Reactions  . Lipitor (Atorvastatin Calcium)     HIGH DOSES  . Pravachol     HIGH DOSES  . Zocor (Simvastatin - High Dose)     Patient Active Problem List  Diagnoses  . Atrial fibrillation  . CVA, old, ataxia  . Hypercholesterolemia  . Prostate cancer  . Benign hypertensive heart disease without heart failure  . Renal insufficiency    History  Smoking status  . Former Smoker  . Types: Cigarettes  . Quit date: 02/23/1948  Smokeless tobacco  . Not on file    History  Alcohol Use No    No family history on file.  Review of Systems: Constitutional: no fever chills diaphoresis or fatigue or change in weight.  Head and neck: no hearing loss, no epistaxis, no photophobia or  visual disturbance. Respiratory: No cough, shortness of breath or wheezing. Cardiovascular: No chest pain peripheral edema, palpitations. Gastrointestinal: No abdominal distention, no abdominal pain, no change in bowel habits hematochezia or melena. Genitourinary: No dysuria, no frequency, no urgency, no nocturia. Musculoskeletal:No arthralgias, no back pain, no gait disturbance or myalgias. Neurological: No dizziness, no headaches, no numbness, no seizures, no syncope, no weakness, no tremors. Hematologic: No lymphadenopathy, no easy bruising. Psychiatric: No confusion, no hallucinations, no sleep disturbance.    Physical Exam: Filed Vitals:   11/12/10 0959  BP: 140/78  Pulse: 60  The general appearance feels a well-developed elderly gentleman in no distressPupils equal and reactive.   Extraocular Movements are full.  There is no scleral icterus.  The mouth and pharynx are normal.  The neck is supple.  The carotids reveal  no bruits.  The jugular venous pressure is normal.  The thyroid is not enlarged.  There is no lymphadenopathy.  The chest is clear to percussion and auscultation. There are no rales or rhonchi. Expansion of the chest is symmetrical.   heart reveals an irregular rhythm and no gallop or rub.  There is a soft systolic ejection murmur at the aortic area radiating toward the neck.The abdomen is soft and nontender. Bowel sounds are normal. The liver and spleen are not enlarged. There Are no abdominal masses. There are no bruits.  The pedal pulses are good.  There is no phlebitis or edema.  There is no cyanosis or clubbing.He is wearing good support hose today and there is only trace edema present. The skin is warm and dry.  There is no rash.       Assessment / Plan: Continue same meds except Increase Coumadin as above.  Recheck a pro time in 2 weeks.  Recheck a followup office visit in 3-4 months

## 2010-11-13 ENCOUNTER — Encounter: Payer: Medicare Other | Admitting: *Deleted

## 2010-11-24 ENCOUNTER — Ambulatory Visit (INDEPENDENT_AMBULATORY_CARE_PROVIDER_SITE_OTHER): Payer: Medicare Other | Admitting: *Deleted

## 2010-11-24 DIAGNOSIS — I4891 Unspecified atrial fibrillation: Secondary | ICD-10-CM

## 2010-12-08 ENCOUNTER — Encounter: Payer: Medicare Other | Admitting: *Deleted

## 2010-12-08 ENCOUNTER — Ambulatory Visit (INDEPENDENT_AMBULATORY_CARE_PROVIDER_SITE_OTHER): Payer: Medicare Other | Admitting: *Deleted

## 2010-12-08 DIAGNOSIS — I4891 Unspecified atrial fibrillation: Secondary | ICD-10-CM

## 2010-12-08 LAB — POCT INR: INR: 2.3

## 2010-12-22 ENCOUNTER — Ambulatory Visit (INDEPENDENT_AMBULATORY_CARE_PROVIDER_SITE_OTHER): Payer: Medicare Other | Admitting: *Deleted

## 2010-12-22 DIAGNOSIS — I4891 Unspecified atrial fibrillation: Secondary | ICD-10-CM

## 2010-12-22 LAB — POCT INR: INR: 2.2

## 2011-01-02 ENCOUNTER — Encounter (HOSPITAL_COMMUNITY): Payer: Self-pay | Admitting: *Deleted

## 2011-01-02 ENCOUNTER — Emergency Department (HOSPITAL_COMMUNITY)
Admission: EM | Admit: 2011-01-02 | Discharge: 2011-01-03 | Disposition: A | Discharge status: Home / Self Care | Payer: Medicare Other | Attending: Emergency Medicine | Admitting: Emergency Medicine

## 2011-01-02 DIAGNOSIS — Z79899 Other long term (current) drug therapy: Secondary | ICD-10-CM | POA: Insufficient documentation

## 2011-01-02 DIAGNOSIS — L988 Other specified disorders of the skin and subcutaneous tissue: Secondary | ICD-10-CM | POA: Insufficient documentation

## 2011-01-02 DIAGNOSIS — E785 Hyperlipidemia, unspecified: Secondary | ICD-10-CM | POA: Insufficient documentation

## 2011-01-02 DIAGNOSIS — Z7901 Long term (current) use of anticoagulants: Secondary | ICD-10-CM | POA: Insufficient documentation

## 2011-01-02 DIAGNOSIS — Z7982 Long term (current) use of aspirin: Secondary | ICD-10-CM | POA: Insufficient documentation

## 2011-01-02 DIAGNOSIS — I4891 Unspecified atrial fibrillation: Secondary | ICD-10-CM | POA: Insufficient documentation

## 2011-01-02 DIAGNOSIS — I119 Hypertensive heart disease without heart failure: Secondary | ICD-10-CM | POA: Insufficient documentation

## 2011-01-02 DIAGNOSIS — Z8673 Personal history of transient ischemic attack (TIA), and cerebral infarction without residual deficits: Secondary | ICD-10-CM | POA: Insufficient documentation

## 2011-01-02 DIAGNOSIS — R040 Epistaxis: Secondary | ICD-10-CM | POA: Insufficient documentation

## 2011-01-02 NOTE — ED Notes (Signed)
Patient here with c/o nasal bleeding since around 3pm intermittenlty. It started bleeding a lot around an hour ago

## 2011-01-03 LAB — CBC
HCT: 39.1 % (ref 39.0–52.0)
Hemoglobin: 13.1 g/dL (ref 13.0–17.0)
MCHC: 33.5 g/dL (ref 30.0–36.0)
MCV: 94.7 fL (ref 78.0–100.0)
RDW: 13.6 % (ref 11.5–15.5)

## 2011-01-03 LAB — DIFFERENTIAL
Basophils Absolute: 0 10*3/uL (ref 0.0–0.1)
Basophils Relative: 1 % (ref 0–1)
Eosinophils Relative: 5 % (ref 0–5)
Lymphocytes Relative: 31 % (ref 12–46)
Monocytes Absolute: 1 10*3/uL (ref 0.1–1.0)
Monocytes Relative: 12 % (ref 3–12)

## 2011-01-03 MED ORDER — OXYMETAZOLINE HCL 0.05 % NA SOLN
1.0000 | Freq: Once | NASAL | Status: AC
  Administered 2011-01-03: 1 via NASAL
  Filled 2011-01-03: qty 15

## 2011-01-03 MED ORDER — BACITRACIN 500 UNIT/GM EX OINT
1.0000 "application " | TOPICAL_OINTMENT | Freq: Two times a day (BID) | CUTANEOUS | Status: DC
  Administered 2011-01-03 (×2): 1 via TOPICAL
  Filled 2011-01-03 (×2): qty 0.9

## 2011-01-03 NOTE — ED Notes (Signed)
DR Hyacinth Meeker at bedside with epistaxis cart.

## 2011-01-03 NOTE — ED Notes (Signed)
Pt denies pain at this time. No active bleeding noted. Pt ambulatory steady gait.

## 2011-01-03 NOTE — ED Notes (Signed)
Pts wife states pt began having intermittant nose bleeds a few weeks ago. Pt states tonight nose began bleeding around 2100. States was able to stop it just as they arrived here. No active bleeding at this time. Pt denies headache or dizziness.

## 2011-01-03 NOTE — ED Provider Notes (Signed)
History     CSN: [id number] Arrival date & time: 01/02/2011 10:43 PM   First MD Initiated Contact with Patient 01/03/11 0019      Chief Complaint  Patient presents with  . Epistaxis    (Consider location/radiation/quality/duration/timing/severity/associated sxs/prior treatment) HPI Comments: Family member states that patient has had nosebleeds daily for the last week. They usually last just a couple of minutes and then resolved but today lasted longer and did not resolve with pressure on the naris and Afrin. On arrival the patient noted that the bleeding has stopped. He has seen ear nose and throat doctor's in the past for this and required chemical cautery as well as packing in past years.  Patient is a 77 y.o. male presenting with nosebleeds. The history is provided by the patient and a relative.  Epistaxis  This is a recurrent problem. Episode onset: One week ago. The problem occurs daily. The problem has been gradually worsening. The problem is associated with anticoagulants (Coumadin, aspirin). The bleeding has been from the right nare. He has tried applying pressure and vasoconstrictors (Afrin) for the symptoms. The treatment provided significant relief.    Past Medical History  Diagnosis Date  . Hypertension   . Stroke     ischemic / right cerebellar/posterior inferior cerebellar artery / right pos infarct  . Hyperlipidemia   . HBP (high blood pressure)   . Melanoma   . Hypokalemia   . Atrial fibrillation   . Hypertensive heart disease   . Chronic back pain   . History of epistaxis 07/19/2002  . Personal history of long-term (current) use of anticoagulants     Past Surgical History  Procedure Date  . Nasal sinus surgery 1998  . Tonsillectomy   . Skin graft   . Melanoma excision     last surgery for melanoma was 2010    History reviewed. No pertinent family history.  History  Substance Use Topics  . Smoking status: Former Smoker    Types: Cigarettes    Quit  date: 02/23/1948  . Smokeless tobacco: Not on file  . Alcohol Use: No      Review of Systems  Constitutional: Negative for fever and chills.  HENT: Positive for nosebleeds.   Gastrointestinal: Negative for nausea and vomiting.  Skin: Negative for rash.  Neurological: Negative for syncope and weakness.  Hematological: Bruises/bleeds easily.    Allergies  Pravachol and Zocor  Home Medications   Current Outpatient Rx  Name Route Sig Dispense Refill  . ASPIRIN 81 MG PO TABS Oral Take 81 mg by mouth daily.      . ATORVASTATIN CALCIUM 10 MG PO TABS Oral Take 1 tablet (10 mg total) by mouth daily. 30 tablet 11  . BISOPROLOL-HYDROCHLOROTHIAZIDE 10-6.25 MG PO TABS Oral Take 1 tablet by mouth Daily.    Marland Kitchen CALCIUM CARBONATE 600 MG PO TABS Oral Take 600 mg by mouth daily. Taking 1200 daily    . XGEVA [place] Subcutaneous Inject into the skin. monthly     . STOOL SOFTENER PO Oral Take 1 tablet by mouth 2 (two) times daily.     . FUROSEMIDE 20 MG PO TABS Oral Take 1 tablet (20 mg total) by mouth daily. 30 tablet 11  . LUPRON DEPOT IM Intramuscular Inject into the muscle every 3 (three) months.     Marland Kitchen LOSARTAN POTASSIUM-HCTZ 100-25 MG PO TABS Oral Take 1 tablet by mouth Daily.    . MULTIVITAMINS PO TABS Oral Take 1 tablet by mouth daily. (  with B - Complex )     . FISH OIL PO Oral Take 1 capsule by mouth daily. ( Mega Red )     . POTASSIUM CHLORIDE CRYS CR 20 MEQ PO TBCR Oral Take 1 tablet (20 mEq total) by mouth daily. 30 tablet 11  . WARFARIN SODIUM 5 MG PO TABS Oral Take 2.5-5 mg by mouth daily. Take 5 mg Monday, Wednesday, and Friday / Take 2.5 mg (half tablet) on Tuesday, Thursday, Saturday, and Sunday     . ZETIA 10 MG PO TABS Oral Take 1 tablet by mouth Daily.      BP 189/95  Pulse 60  Temp(Src) 97.8 F (36.6 C) (Oral)  Resp 20  SpO2 100%  Physical Exam  Nursing note and vitals reviewed. Constitutional: He appears well-developed and well-nourished. No distress.  HENT:  Head:  Normocephalic and atraumatic.  Mouth/Throat: Oropharynx is clear and moist. No oropharyngeal exudate.       Right naris with mild blood clots, no active bleeding. Oropharynx clear  Eyes: Conjunctivae and EOM are normal. Pupils are equal, round, and reactive to light. Right eye exhibits no discharge. Left eye exhibits no discharge. No scleral icterus.  Neck: Normal range of motion. Neck supple. No JVD present. No thyromegaly present.  Cardiovascular: Normal rate, normal heart sounds and intact distal pulses.  Exam reveals no gallop and no friction rub.   No murmur heard.      Atrial fibrillation  Pulmonary/Chest: Effort normal and breath sounds normal. No respiratory distress. He has no wheezes. He has no rales.  Abdominal: Soft. Bowel sounds are normal. He exhibits no distension and no mass. There is no tenderness.  Musculoskeletal: Normal range of motion. He exhibits no edema and no tenderness.  Lymphadenopathy:    He has no cervical adenopathy.  Neurological: He is alert. Coordination normal.  Skin: Skin is warm and dry. No rash noted. No erythema.  Psychiatric: He has a normal mood and affect. His behavior is normal.    ED Course  EPISTAXIS MANAGEMENT Date/Time: 01/03/2011 2:00 AM Performed by: Eber Hong D Authorized by: Eber Hong D Consent: Verbal consent obtained. Risks and benefits: risks, benefits and alternatives were discussed Consent given by: patient Patient understanding: patient states understanding of the procedure being performed Patient consent: the patient's understanding of the procedure matches consent given Procedure consent: procedure consent matches procedure scheduled Relevant documents: relevant documents present and verified Test results: test results available and properly labeled Site marked: the operative site was marked Required items: required blood products, implants, devices, and special equipment available Patient identity confirmed: verbally  with patient and arm band Time out: Immediately prior to procedure a "time out" was called to verify the correct patient, procedure, equipment, support staff and site/side marked as required. Preparation: Patient was prepped and draped in the usual sterile fashion. Local anesthetic: topical anesthetic (4% lidocaine on cotton swab topical to the right there) Patient sedated: no Treatment site: right anterior Repair method: silver nitrate Post-procedure assessment: bleeding stopped Treatment complexity: simple Patient tolerance: Patient tolerated the procedure well with no immediate complications.   (including critical care time)  Labs Reviewed  CBC - Abnormal; Notable for the following:    RBC 4.13 (*)    All other components within normal limits  PROTIME-INR - Abnormal; Notable for the following:    Prothrombin Time 24.5 (*)    INR 2.16 (*)    All other components within normal limits  DIFFERENTIAL   No results found.  1. Epistaxis       MDM  Patient has significant hypertension, ongoing nosebleed currently with blood clot in there. Will require further evaluation after evacuation for source of bleeding. Check coags, reevaluate blood pressure.   Labs reviewed, patient advised to hold next dose of Coumadin. Has sensation of bleeding at this time and will followup with your nose and throat doctor on Monday. Is expressed understanding of indications for return.     Vida Roller, MD 01/03/11 [phone]

## 2011-01-05 ENCOUNTER — Telehealth: Payer: Self-pay | Admitting: Cardiology

## 2011-01-05 NOTE — Telephone Encounter (Signed)
Pt had nose bleed over the weekend (Saturday night-pt went to ER and cauterized pt nose). Pt ENT suggested that pt not take any coumadin until Wednesday and pt wife wanted to discuss this with MD/nurse to make sure they approve. Please return pt wife call to discuss further.

## 2011-01-05 NOTE — Telephone Encounter (Signed)
Agree with plan 

## 2011-01-05 NOTE — Telephone Encounter (Signed)
INR Saturday night was 2.16 and was advised not to have any coumadin that night.  He did take 2.5 mg on Sunday. He followed up with Dr. Haroldine Laws yesterday and he had to cauterize another small place and said to hold coumadin until tomorrow.  Wife very concerned for him to be off that long.  Discussed with Lawson Fiscal and will have him resume is regular dose of coumadin and scheduled follow up for him on Friday 11/16 for office visit with Lawson Fiscal and INR.  Blood pressure was elevated when he left hospital, has not taken today.

## 2011-01-08 ENCOUNTER — Other Ambulatory Visit: Payer: Self-pay | Admitting: Pharmacist

## 2011-01-08 ENCOUNTER — Ambulatory Visit (INDEPENDENT_AMBULATORY_CARE_PROVIDER_SITE_OTHER): Payer: Medicare Other | Admitting: Nurse Practitioner

## 2011-01-08 ENCOUNTER — Ambulatory Visit (INDEPENDENT_AMBULATORY_CARE_PROVIDER_SITE_OTHER): Payer: Medicare Other | Admitting: *Deleted

## 2011-01-08 ENCOUNTER — Encounter: Payer: Self-pay | Admitting: Nurse Practitioner

## 2011-01-08 VITALS — BP 138/82 | HR 68 | Ht 72.0 in | Wt 198.4 lb

## 2011-01-08 DIAGNOSIS — I4891 Unspecified atrial fibrillation: Secondary | ICD-10-CM

## 2011-01-08 DIAGNOSIS — R040 Epistaxis: Secondary | ICD-10-CM

## 2011-01-08 DIAGNOSIS — I119 Hypertensive heart disease without heart failure: Secondary | ICD-10-CM

## 2011-01-08 LAB — POCT INR: INR: 1.5

## 2011-01-08 MED ORDER — AMLODIPINE BESYLATE 2.5 MG PO TABS: 2.5000 mg | ORAL_TABLET | Freq: Every day | ORAL | Status: DC

## 2011-01-08 NOTE — Patient Instructions (Addendum)
Continue with your current medicines. We are going to add Norvasc 2.5 mg daily.  Monitor your blood pressure at home.  Record your readings and bring to your next visit.  Limit sodium intake.  I will see you on the November 30th for a visit.   Call the [facility] office at [phone] if you have any questions, problems or concerns.

## 2011-01-08 NOTE — Progress Notes (Signed)
Craig [name] Date of Birth: 08-22-1924 Medical Record #[id number]  History of Present Illness: Patient presents for a work in visit. He is seen for Dr. Patty Sermons. He has HTN, hyperlipidemia and prior stroke with chronic issues with dysequilibrium.  He is in chronic atrial fib.  He is on chronic coumadin. No known history of CAD. He does have metastatic prostate cancer and is on Lupron.   He called earlier this week with complaints of nose bleeds. He was in the ER last Saturday night and they had to cauterize the bleeding. He was referred to ENT as well and saw Dr. Haroldine Laws, who cauterized another small place. He was initially told to hold his coumadin until Wednesday. Wife became very concerned about him being off of the coumadin for so long.  His INR was only 2.16 in the ER. He took 2.5 mg of coumadin on Sunday night, none on Monday and we resumed his regular dose on Tuesday. Blood pressure was up.   Since then, he has been doing ok. He had only a slight nose bleed. No chest pain. No new neurologic symptoms. He reports that he really only skipped one dose of his coumadin. His wife reports that his coumadin has been a little more difficult to control since the addition of Xgeva. I looked it up but there was no interaction reported with it or with his Lupron.  She does note that blood pressure is primarily in the 160's at home. Salt use is questionable. They eat out for lunch.   Current Outpatient Prescriptions on File Prior to Visit  Medication Sig Dispense Refill  . aspirin 81 MG tablet Take 81 mg by mouth daily.        Marland Kitchen atorvastatin (LIPITOR) 10 MG tablet Take 1 tablet (10 mg total) by mouth daily.  30 tablet  11  . bisoprolol-hydrochlorothiazide (ZIAC) 10-6.25 MG per tablet Take 1 tablet by mouth Daily.      . calcium carbonate (OS-CAL) 600 MG TABS Take 600 mg by mouth daily. Taking 1200 daily      . Denosumab (XGEVA [place]) Inject into the skin. monthly       . Docusate Calcium (STOOL  SOFTENER PO) Take 1 tablet by mouth 2 (two) times daily.       . furosemide (LASIX) 20 MG tablet Take 1 tablet (20 mg total) by mouth daily.  30 tablet  11  . Leuprolide Acetate (LUPRON DEPOT IM) Inject into the muscle every 3 (three) months.       Marland Kitchen losartan-hydrochlorothiazide (HYZAAR) 100-25 MG per tablet Take 1 tablet by mouth Daily.      . multivitamin (THERAGRAN) per tablet Take 1 tablet by mouth daily. ( with B - Complex )       . Omega-3 Fatty Acids (FISH OIL PO) Take 1 capsule by mouth daily. ( Mega Red )       . potassium chloride SA (K-DUR,KLOR-CON) 20 MEQ tablet Take 1 tablet (20 mEq total) by mouth daily.  30 tablet  11  . warfarin (COUMADIN) 5 MG tablet Take 2.5-5 mg by mouth daily. Take 5 mg Monday, Wednesday, and Friday / Take 2.5 mg (half tablet) on Tuesday, Thursday, Saturday, and Sunday       . ZETIA 10 MG tablet Take 1 tablet by mouth Daily.        Allergies  Allergen Reactions  . Pravachol     HIGH DOSES  . Zocor (Simvastatin - High Dose)  Past Medical History  Diagnosis Date  . Hypertension   . Stroke     ischemic / right cerebellar/posterior inferior cerebellar artery / right pos infarct  . Hyperlipidemia   . HBP (high blood pressure)   . Melanoma   . Hypokalemia   . Atrial fibrillation   . Hypertensive heart disease   . Chronic back pain   . History of epistaxis 07/19/2002  . Personal history of long-term (current) use of anticoagulants     Past Surgical History  Procedure Date  . Nasal sinus surgery 1998  . Tonsillectomy   . Skin graft   . Melanoma excision     last surgery for melanoma was 2010    History  Smoking status  . Former Smoker  . Types: Cigarettes  . Quit date: 02/23/1948  Smokeless tobacco  . Not on file    History  Alcohol Use No    No family history on file.  Review of Systems: The review of systems is per the HPI.  He does have some chronic issues with swelling but is managed well with support stockings. No chest  pain. No falls.  All other systems were reviewed and are negative.  Physical Exam: BP 156/92  Pulse 68  Ht 6' (1.829 m)  Wt 198 lb 6.4 oz (89.994 kg)  BMI 26.91 kg/m2 Patient is very pleasant and in no acute distress. Skin is warm and dry. Color is normal.  HEENT is unremarkable. Normocephalic/atraumatic. PERRL. Sclera are nonicteric. Neck is supple. No masses. No JVD. Lungs are clear. Cardiac exam shows an irregular rate and rhythm. Abdomen is soft. Extremities are without edema. Gait and ROM are intact but his gait is unsteady.  No gross neurologic deficits noted.   LABORATORY DATA: INR today is only 1.5. His dose has been adjusted by the coumadin clinic.   Assessment / Plan:

## 2011-01-08 NOTE — Assessment & Plan Note (Signed)
Blood pressures have been in the 160's at home. I have started him on a small dose of Norvasc at 2.5 mg daily. His wife will continue to monitor his blood pressure at home. I will see him back on the 30th. Patient and wife are agreeable to this plan and will call if any problems develop in the interim.

## 2011-01-08 NOTE — Assessment & Plan Note (Signed)
This is chronic. He remains on coumadin. Rate is controlled.

## 2011-01-08 NOTE — Assessment & Plan Note (Signed)
No further bleeding. He only skipped one dose of his coumadin. INR is down today and has been adjusted. We will have recheck in 2 weeks.

## 2011-01-11 ENCOUNTER — Ambulatory Visit: Payer: Medicare Other | Admitting: Cardiology

## 2011-01-11 ENCOUNTER — Encounter: Payer: Medicare Other | Admitting: *Deleted

## 2011-01-22 ENCOUNTER — Ambulatory Visit (INDEPENDENT_AMBULATORY_CARE_PROVIDER_SITE_OTHER): Payer: Medicare Other | Admitting: *Deleted

## 2011-01-22 ENCOUNTER — Ambulatory Visit (INDEPENDENT_AMBULATORY_CARE_PROVIDER_SITE_OTHER): Payer: Medicare Other | Admitting: Nurse Practitioner

## 2011-01-22 ENCOUNTER — Encounter: Payer: Self-pay | Admitting: Nurse Practitioner

## 2011-01-22 VITALS — BP 152/80 | HR 68 | Ht 73.0 in | Wt 203.0 lb

## 2011-01-22 DIAGNOSIS — I4891 Unspecified atrial fibrillation: Secondary | ICD-10-CM

## 2011-01-22 DIAGNOSIS — R040 Epistaxis: Secondary | ICD-10-CM

## 2011-01-22 DIAGNOSIS — I119 Hypertensive heart disease without heart failure: Secondary | ICD-10-CM

## 2011-01-22 LAB — POCT INR: INR: 3

## 2011-01-22 NOTE — Progress Notes (Signed)
Craig [name] Date of Birth: 07-Nov-1924 Medical Record #[id number]  History of Present Illness: Craig [name] is seen today for a follow up visit. He is seen for Dr. Patty Sermons. He has a history of HTN, hyperlipidemia, chronic atrial fib and prior stroke. Also has metastatic prostate cancer and is on Lupron therapy. He remains on chronic coumadin. We saw him about 2 weeks ago and started low dose Norvasc due to elevated blood pressures at home. Had had some recent nose bleeding and has seen ENT for cauterization. No cardiac complaints.   He comes in today and reports that he is doing ok. Blood pressure has trended down at home. No further nose bleeding. No chest pain or shortness of breath. His swelling has not worsened.  Current Outpatient Prescriptions on File Prior to Visit  Medication Sig Dispense Refill  . amLODipine (NORVASC) 2.5 MG tablet Take 1 tablet (2.5 mg total) by mouth daily.  30 tablet  11  . aspirin 81 MG tablet Take 81 mg by mouth daily.        Marland Kitchen atorvastatin (LIPITOR) 10 MG tablet Take 1 tablet (10 mg total) by mouth daily.  30 tablet  11  . bisoprolol-hydrochlorothiazide (ZIAC) 10-6.25 MG per tablet Take 1 tablet by mouth Daily.      . calcium carbonate (OS-CAL) 600 MG TABS Take 600 mg by mouth daily. Taking 1200 daily      . Denosumab (XGEVA [place]) Inject into the skin. monthly       . Docusate Calcium (STOOL SOFTENER PO) Take 1 tablet by mouth 2 (two) times daily.       . furosemide (LASIX) 20 MG tablet Take 1 tablet (20 mg total) by mouth daily.  30 tablet  11  . Leuprolide Acetate (LUPRON DEPOT IM) Inject into the muscle every 3 (three) months.       Marland Kitchen losartan-hydrochlorothiazide (HYZAAR) 100-25 MG per tablet Take 1 tablet by mouth Daily.      . multivitamin (THERAGRAN) per tablet Take 1 tablet by mouth daily. ( with B - Complex )       . Omega-3 Fatty Acids (FISH OIL PO) Take 1 capsule by mouth daily. ( Mega Red )       . potassium chloride SA (K-DUR,KLOR-CON) 20 MEQ tablet  Take 1 tablet (20 mEq total) by mouth daily.  30 tablet  11  . warfarin (COUMADIN) 5 MG tablet Take 2.5-5 mg by mouth daily. Take 5 mg Monday, Wednesday, and Friday / Take 2.5 mg (half tablet) on Tuesday, Thursday, Saturday, and Sunday       . ZETIA 10 MG tablet Take 1 tablet by mouth Daily.        Allergies  Allergen Reactions  . Pravachol     HIGH DOSES  . Zocor (Simvastatin - High Dose)     Past Medical History  Diagnosis Date  . Stroke     ischemic / right cerebellar/posterior inferior cerebellar artery / right pos infarct  . Hyperlipidemia   . Melanoma   . Hypokalemia   . Atrial fibrillation   . Hypertensive heart disease   . Chronic back pain   . History of epistaxis 07/19/2002  . Personal history of long-term (current) use of anticoagulants   . Prostate cancer, primary, with metastasis from prostate to other site     on Lupron injections per GU    Past Surgical History  Procedure Date  . Nasal sinus surgery 1998  . Tonsillectomy   . Skin  graft   . Melanoma excision     last surgery for melanoma was 2010    History  Smoking status  . Former Smoker  . Types: Cigarettes  . Quit date: 02/23/1948  Smokeless tobacco  . Not on file    History  Alcohol Use No    Family History  Problem Relation Age of Onset  . Heart failure Mother   . Heart attack Father     Review of Systems: The review of systems is per the HPI.  All other systems were reviewed and are negative.  Physical Exam: BP 152/80  Pulse 68  Ht 6\' 1"  (1.854 m)  Wt 203 lb (92.08 kg)  BMI 26.78 kg/m2 Patient is very pleasant and in no acute distress. Skin is warm and dry. Color is normal.  HEENT is unremarkable. Normocephalic/atraumatic. PERRL. Sclera are nonicteric. Neck is supple. No masses. No JVD. Lungs are clear. Cardiac exam shows an irregular rhythm. Rate is controlled. Abdomen is soft. Extremities are full with 1+ edema. Gait and ROM are intact. No gross neurologic deficits  noted.   LABORATORY DATA: N/A   Assessment / Plan:

## 2011-01-22 NOTE — Patient Instructions (Addendum)
Continue with your current medicines. Monitor your blood pressure at home.  Record your readings and bring to your next visit. Limit sodium intake. Call for any problems.  Keep your January appointment with Dr. Patty Sermons.

## 2011-01-22 NOTE — Assessment & Plan Note (Signed)
Resolved with no further episodes

## 2011-01-22 NOTE — Assessment & Plan Note (Signed)
Blood pressure is improved here and at home. We will continue the low dose Norvasc. He will see Dr. Patty Sermons in January as scheduled. His wife will continue to monitor at home. Patient is agreeable to this plan and will call if any problems develop in the interim.

## 2011-01-22 NOTE — Assessment & Plan Note (Signed)
Rate is controlled. He is maintained on his coumadin.

## 2011-01-23 ENCOUNTER — Emergency Department (HOSPITAL_COMMUNITY)
Admission: EM | Admit: 2011-01-23 | Discharge: 2011-01-23 | Disposition: A | Discharge status: Home / Self Care | Payer: Medicare Other | Attending: Emergency Medicine | Admitting: Emergency Medicine

## 2011-01-23 ENCOUNTER — Encounter (HOSPITAL_COMMUNITY): Payer: Self-pay | Admitting: *Deleted

## 2011-01-23 DIAGNOSIS — R040 Epistaxis: Secondary | ICD-10-CM

## 2011-01-23 DIAGNOSIS — M549 Dorsalgia, unspecified: Secondary | ICD-10-CM | POA: Insufficient documentation

## 2011-01-23 DIAGNOSIS — I4891 Unspecified atrial fibrillation: Secondary | ICD-10-CM | POA: Insufficient documentation

## 2011-01-23 DIAGNOSIS — Z8673 Personal history of transient ischemic attack (TIA), and cerebral infarction without residual deficits: Secondary | ICD-10-CM | POA: Insufficient documentation

## 2011-01-23 DIAGNOSIS — G8929 Other chronic pain: Secondary | ICD-10-CM | POA: Insufficient documentation

## 2011-01-23 DIAGNOSIS — Z79899 Other long term (current) drug therapy: Secondary | ICD-10-CM | POA: Insufficient documentation

## 2011-01-23 DIAGNOSIS — Z7901 Long term (current) use of anticoagulants: Secondary | ICD-10-CM | POA: Insufficient documentation

## 2011-01-23 DIAGNOSIS — Z8546 Personal history of malignant neoplasm of prostate: Secondary | ICD-10-CM | POA: Insufficient documentation

## 2011-01-23 DIAGNOSIS — E785 Hyperlipidemia, unspecified: Secondary | ICD-10-CM | POA: Insufficient documentation

## 2011-01-23 LAB — PROTIME-INR: INR: 2.62 — ABNORMAL HIGH (ref 0.00–1.49)

## 2011-01-23 MED ORDER — AMOXICILLIN-POT CLAVULANATE 875-125 MG PO TABS: 1.0000 | ORAL_TABLET | Freq: Two times a day (BID) | ORAL | Status: AC

## 2011-01-23 NOTE — ED Provider Notes (Signed)
History     CSN: [id number] Arrival date & time: 01/23/2011  5:39 AM   First MD Initiated Contact with Patient 01/23/11 0545      Chief Complaint  Patient presents with  . Epistaxis    (Consider location/radiation/quality/duration/timing/severity/associated sxs/prior treatment) Patient is a 77 y.o. male presenting with nosebleeds. The history is provided by the patient.  Epistaxis  This is a recurrent problem. The current episode started 3 to 5 hours ago. The problem occurs constantly. The problem has not changed since onset.The problem is associated with anticoagulants. The bleeding has been from the right nare. He has tried applying pressure for the symptoms. The treatment provided no relief. His past medical history is significant for frequent nosebleeds. His past medical history does not include nose-picking.   patient on Coumadin for atrial fibrillation. He has recurrent nosebleeds from right nostril, followed by ENT, has had multiple cauterizations but has not been active recently. Woke up around 2 AM with bleeding uncontrolled by pressure and Afrin home. No trauma. He recently increased his Coumadin held and has been restarted to normalize his INR. A moderate in severity. no pain or radiation. No known aggravating factors other than being on Coumadin  Past Medical History  Diagnosis Date  . Stroke     ischemic / right cerebellar/posterior inferior cerebellar artery / right pos infarct  . Hyperlipidemia   . Melanoma   . Hypokalemia   . Atrial fibrillation   . Hypertensive heart disease   . Chronic back pain   . History of epistaxis 07/19/2002  . Personal history of long-term (current) use of anticoagulants   . Prostate cancer, primary, with metastasis from prostate to other site     on Lupron injections per GU    Past Surgical History  Procedure Date  . Nasal sinus surgery 1998  . Tonsillectomy   . Skin graft   . Melanoma excision     last surgery for melanoma was 2010      Family History  Problem Relation Age of Onset  . Heart failure Mother   . Heart attack Father     History  Substance Use Topics  . Smoking status: Former Smoker    Types: Cigarettes    Quit date: 02/23/1948  . Smokeless tobacco: Not on file  . Alcohol Use: No      Review of Systems  Constitutional: Negative for fever and chills.  HENT: Positive for nosebleeds. Negative for neck pain and neck stiffness.   Eyes: Negative for pain.  Respiratory: Negative for shortness of breath.   Cardiovascular: Negative for chest pain.  Gastrointestinal: Negative for abdominal pain.  Genitourinary: Negative for dysuria.  Musculoskeletal: Negative for back pain.  Skin: Negative for rash.  Neurological: Negative for headaches.  All other systems reviewed and are negative.    Allergies  Pravachol and Zocor  Home Medications   Current Outpatient Rx  Name Route Sig Dispense Refill  . AMLODIPINE BESYLATE 2.5 MG PO TABS Oral Take 1 tablet (2.5 mg total) by mouth daily. 30 tablet 11  . ASPIRIN 81 MG PO TABS Oral Take 81 mg by mouth daily.      . ATORVASTATIN CALCIUM 10 MG PO TABS Oral Take 1 tablet (10 mg total) by mouth daily. 30 tablet 11  . B COMPLEX-C PO TABS Oral Take 1 tablet by mouth daily.      Marland Kitchen BISOPROLOL-HYDROCHLOROTHIAZIDE 10-6.25 MG PO TABS Oral Take 1 tablet by mouth Daily.    Marland Kitchen CALCIUM CARBONATE  600 MG PO TABS Oral Take 600 mg by mouth daily. Taking 1200 daily    . XGEVA [place] Subcutaneous Inject into the skin. monthly     . STOOL SOFTENER PO Oral Take 1 tablet by mouth 2 (two) times daily.     . FUROSEMIDE 20 MG PO TABS Oral Take 1 tablet (20 mg total) by mouth daily. 30 tablet 11  . LUPRON DEPOT IM Intramuscular Inject into the muscle every 3 (three) months.     Marland Kitchen LOSARTAN POTASSIUM-HCTZ 100-25 MG PO TABS Oral Take 1 tablet by mouth Daily.    . MULTIVITAMINS PO TABS Oral Take 1 tablet by mouth daily. ( with B - Complex )     . FISH OIL PO Oral Take 1 capsule by mouth  daily. ( Mega Red )     . POTASSIUM CHLORIDE CRYS CR 20 MEQ PO TBCR Oral Take 1 tablet (20 mEq total) by mouth daily. 30 tablet 11  . WARFARIN SODIUM 5 MG PO TABS Oral Take 2.5-5 mg by mouth daily. Take 5 mg Monday, Wednesday, and Friday / Take 2.5 mg (half tablet) on Tuesday, Thursday, Saturday, and Sunday    . ZETIA 10 MG PO TABS Oral Take 1 tablet by mouth Daily.      BP 154/115  Pulse 92  Resp 20  SpO2 98%  Physical Exam  Constitutional: He is oriented to person, place, and time. He appears well-developed and well-nourished.  HENT:  Head: Normocephalic and atraumatic.       Active bleeding from right nare appears to be anterior although difficult to visualize.   Eyes: Conjunctivae and EOM are normal. Pupils are equal, round, and reactive to light.  Neck: Trachea normal. Neck supple. No thyromegaly present.  Cardiovascular: Normal rate, regular rhythm, S1 normal, S2 normal and normal pulses.     No systolic murmur is present   No diastolic murmur is present  Pulses:      Radial pulses are 2+ on the right side, and 2+ on the left side.  Pulmonary/Chest: Effort normal and breath sounds normal. He has no wheezes. He has no rhonchi. He has no rales. He exhibits no tenderness.  Abdominal: Soft. Normal appearance and bowel sounds are normal. There is no tenderness. There is no CVA tenderness and negative Murphy's sign.  Musculoskeletal:       BLE:s Calves nontender, no cords or erythema, negative Homans sign  Neurological: He is alert and oriented to person, place, and time. He has normal strength. No cranial nerve deficit or sensory deficit. GCS eye subscore is 4. GCS verbal subscore is 5. GCS motor subscore is 6.  Skin: Skin is warm and dry. No rash noted. He is not diaphoretic.  Psychiatric: His speech is normal.       Cooperative and appropriate    ED Course  EPISTAXIS MANAGEMENT Date/Time: 01/23/2011 6:01 AM Performed by: Sunnie Nielsen Authorized by: Sunnie Nielsen Consent:  Verbal consent obtained. Risks and benefits: risks, benefits and alternatives were discussed Consent given by: patient Patient understanding: patient states understanding of the procedure being performed Patient consent: the patient's understanding of the procedure matches consent given Procedure consent: procedure consent matches procedure scheduled Patient identity confirmed: verbally with patient Time out: Immediately prior to procedure a "time out" was called to verify the correct patient, procedure, equipment, support staff and site/side marked as required. Preparation: Patient was prepped and draped in the usual sterile fashion. Treatment site: right anterior Repair method: anterior pack Post-procedure assessment:  bleeding stopped Treatment complexity: complex Patient tolerance: Patient tolerated the procedure well with no immediate complications. Comments: A bacitracin and merocel packing right nare.   (including critical care time)  Labs Reviewed  PROTIME-INR - Abnormal; Notable for the following:    Prothrombin Time 28.4 (*)    INR 2.62 (*)    All other components within normal limits    After packing placed, labs drawn her INR checked as above. Recheck patient feeling well wants to go home. Stable for discharge at 7:33 AM  MDM   Right-sided epistaxis packing as above. has close ENT followup        Sunnie Nielsen, MD 01/23/11 [phone]

## 2011-01-23 NOTE — ED Notes (Signed)
Nosebleed for the past 2 hours.  Actively bleeding in triage

## 2011-01-25 ENCOUNTER — Telehealth: Payer: Self-pay | Admitting: Cardiology

## 2011-01-25 NOTE — Telephone Encounter (Signed)
Patient given Augmentin 875 twice daily x 1 week.  Will hold coumadin today and resume tomorrow at 2.5 mg daily,  while on Augmentin.  Recheck INR on 12/12.  Advise wife

## 2011-01-25 NOTE — Telephone Encounter (Signed)
Pt's wife calling re pt going to er due to severe nose bleed, held coumadin dose this am and was told to call office, pt has dr appt at 1200p today, can be reached until 1130a

## 2011-01-25 NOTE — Telephone Encounter (Signed)
Agree with plan 

## 2011-01-27 ENCOUNTER — Other Ambulatory Visit: Payer: Self-pay | Admitting: Dermatology

## 2011-02-03 ENCOUNTER — Ambulatory Visit (INDEPENDENT_AMBULATORY_CARE_PROVIDER_SITE_OTHER): Payer: Medicare Other | Admitting: *Deleted

## 2011-02-03 DIAGNOSIS — I4891 Unspecified atrial fibrillation: Secondary | ICD-10-CM

## 2011-02-03 LAB — POCT INR: INR: 1.5

## 2011-02-12 ENCOUNTER — Encounter: Payer: Medicare Other | Admitting: *Deleted

## 2011-02-18 ENCOUNTER — Ambulatory Visit (INDEPENDENT_AMBULATORY_CARE_PROVIDER_SITE_OTHER): Payer: Medicare Other | Admitting: *Deleted

## 2011-02-18 DIAGNOSIS — I4891 Unspecified atrial fibrillation: Secondary | ICD-10-CM

## 2011-02-18 LAB — POCT INR: INR: 2.8

## 2011-03-03 ENCOUNTER — Other Ambulatory Visit: Payer: Self-pay | Admitting: Cardiology

## 2011-03-03 MED ORDER — BISOPROLOL-HYDROCHLOROTHIAZIDE 10-6.25 MG PO TABS: 1.0000 | ORAL_TABLET | Freq: Every day | ORAL | Status: DC

## 2011-03-03 MED ORDER — LOSARTAN POTASSIUM-HCTZ 100-25 MG PO TABS: 1.0000 | ORAL_TABLET | Freq: Every day | ORAL | Status: DC

## 2011-03-03 NOTE — Telephone Encounter (Signed)
REFILLED MEDS FROM FAXED. CALLED PATIENTS WIFE, BUT NO ANSWER

## 2011-03-03 NOTE — Telephone Encounter (Signed)
New msg Pt's wife said pharmacy has requested three times and he is out of this med-bisoprolol. Please let her know when done

## 2011-03-16 ENCOUNTER — Ambulatory Visit (INDEPENDENT_AMBULATORY_CARE_PROVIDER_SITE_OTHER): Payer: Medicare Other | Admitting: Pharmacist

## 2011-03-16 ENCOUNTER — Other Ambulatory Visit (INDEPENDENT_AMBULATORY_CARE_PROVIDER_SITE_OTHER): Payer: Medicare Other | Admitting: *Deleted

## 2011-03-16 DIAGNOSIS — I4891 Unspecified atrial fibrillation: Secondary | ICD-10-CM

## 2011-03-16 DIAGNOSIS — E7800 Pure hypercholesterolemia, unspecified: Secondary | ICD-10-CM

## 2011-03-16 LAB — CBC WITH DIFFERENTIAL/PLATELET
Basophils Absolute: 0 10*3/uL (ref 0.0–0.1)
Eosinophils Absolute: 0.5 10*3/uL (ref 0.0–0.7)
Lymphocytes Relative: 31.9 % (ref 12.0–46.0)
MCHC: 33.5 g/dL (ref 30.0–36.0)
Monocytes Relative: 11.5 % (ref 3.0–12.0)
Neutro Abs: 3.6 10*3/uL (ref 1.4–7.7)
Neutrophils Relative %: 49.2 % (ref 43.0–77.0)
Platelets: 175 10*3/uL (ref 150.0–400.0)
RDW: 14.8 % — ABNORMAL HIGH (ref 11.5–14.6)

## 2011-03-16 LAB — BASIC METABOLIC PANEL
BUN: 41 mg/dL — ABNORMAL HIGH (ref 6–23)
CO2: 31 mEq/L (ref 19–32)
Calcium: 10.5 mg/dL (ref 8.4–10.5)
Creatinine, Ser: 1.8 mg/dL — ABNORMAL HIGH (ref 0.4–1.5)
GFR: 37.89 mL/min — ABNORMAL LOW (ref >60.00)
Glucose, Bld: 104 mg/dL — ABNORMAL HIGH (ref 70–99)
Sodium: 144 mEq/L (ref 135–145)

## 2011-03-16 LAB — HEPATIC FUNCTION PANEL
ALT: 14 U/L (ref 0–53)
AST: 23 U/L (ref 0–37)
Alkaline Phosphatase: 58 U/L (ref 39–117)
Total Bilirubin: 0.6 mg/dL (ref 0.3–1.2)

## 2011-03-16 LAB — LIPID PANEL
HDL: 55 mg/dL (ref >39.00)
Total CHOL/HDL Ratio: 3

## 2011-03-16 LAB — POCT INR: INR: 3

## 2011-03-22 ENCOUNTER — Encounter: Payer: Self-pay | Admitting: Cardiology

## 2011-03-22 ENCOUNTER — Ambulatory Visit (INDEPENDENT_AMBULATORY_CARE_PROVIDER_SITE_OTHER): Payer: Medicare Other | Admitting: Cardiology

## 2011-03-22 VITALS — BP 128/86 | HR 55 | Ht 73.0 in | Wt 201.0 lb

## 2011-03-22 DIAGNOSIS — N289 Disorder of kidney and ureter, unspecified: Secondary | ICD-10-CM

## 2011-03-22 DIAGNOSIS — I4891 Unspecified atrial fibrillation: Secondary | ICD-10-CM

## 2011-03-22 DIAGNOSIS — E7800 Pure hypercholesterolemia, unspecified: Secondary | ICD-10-CM

## 2011-03-22 DIAGNOSIS — R040 Epistaxis: Secondary | ICD-10-CM

## 2011-03-22 DIAGNOSIS — I119 Hypertensive heart disease without heart failure: Secondary | ICD-10-CM

## 2011-03-22 NOTE — Assessment & Plan Note (Signed)
The patient has established atrial fibrillation.  We have not attempted cardioversion.  He is asymptomatic in terms of his heart rhythm and has been tolerating the Coumadin well.  He has had no further strokes or TIAs.

## 2011-03-22 NOTE — Progress Notes (Signed)
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  He has a past history of essential hypertension and a previous thrombotic stroke.  He has a history of hypercholesterolemia.  He does not have any history of ischemic heart disease.  He does have established atrial fibrillation and is on Coumadin.  He has known metastatic prostate cancer which was discovered in March 2011 and has been on Lupron injections.  His PSA has now started to rise again and Casodex has been added recently to his regimen.  The patient has a history of epistaxis and had emergency room visits and November and December.  He now uses bacitracin ointment in his nostrils twice a day and also uses a room humidifier and so far has had no further epistaxis.  The patient is on aspirin as well as warfarin.  Current Outpatient Prescriptions  Medication Sig Dispense Refill  . amLODipine (NORVASC) 2.5 MG tablet Take 1 tablet (2.5 mg total) by mouth daily.  30 tablet  11  . aspirin 81 MG tablet Take 81 mg by mouth daily.        Marland Kitchen atorvastatin (LIPITOR) 10 MG tablet Take 1 tablet (10 mg total) by mouth daily.  30 tablet  11  . B Complex-C (B-COMPLEX WITH VITAMIN C) tablet Take 1 tablet by mouth daily.        . bicalutamide (CASODEX) 50 MG tablet Take 50 mg by mouth daily.      . bisoprolol-hydrochlorothiazide (ZIAC) 10-6.25 MG per tablet Take 1 tablet by mouth daily.  90 tablet  3  . calcium carbonate (OS-CAL) 600 MG TABS Take 600 mg by mouth daily. Taking 1200 daily      . Denosumab (XGEVA [place]) Inject into the skin. monthly       . Docusate Calcium (STOOL SOFTENER PO) Take 1 tablet by mouth 2 (two) times daily.       . furosemide (LASIX) 20 MG tablet Take 1 tablet (20 mg total) by mouth daily.  30 tablet   11  . Leuprolide Acetate (LUPRON DEPOT IM) Inject into the muscle every 3 (three) months.       Marland Kitchen losartan-hydrochlorothiazide (HYZAAR) 100-25 MG per tablet Take 1 tablet by mouth daily.  90 tablet  3  . multivitamin (THERAGRAN) per tablet Take 1 tablet by mouth daily. ( with B - Complex )       . Omega-3 Fatty Acids (FISH OIL PO) Take 1 capsule by mouth daily. ( Mega Red )       . potassium chloride SA (K-DUR,KLOR-CON) 20 MEQ tablet Take 1 tablet (20 mEq total) by mouth daily.  30 tablet  11  . warfarin (COUMADIN) 5 MG tablet Take 2.5-5 mg by mouth daily. Take 5 mg Monday, Wednesday, and Friday / Take 2.5 mg (half tablet) on Tuesday, Thursday, Saturday, and Sunday      . ZETIA 10 MG tablet Take 1 tablet by mouth Daily.        Allergies  Allergen Reactions  . Pravachol     Muscle weakness  . Zocor (Simvastatin - High Dose)     Muscle weakness    Patient Active Problem List  Diagnoses  . Atrial fibrillation  . CVA, old, ataxia  . Hypercholesterolemia  .  Prostate cancer  . Benign hypertensive heart disease without heart failure  . Renal insufficiency  . Epistaxis    History  Smoking status  . Former Smoker  . Types: Cigarettes  . Quit date: 02/23/1948  Smokeless tobacco  . Not on file    History  Alcohol Use No    Family History  Problem Relation Age of Onset  . Heart failure Mother   . Heart attack Father     Review of Systems: Constitutional: no fever chills diaphoresis or fatigue or change in weight.  Head and neck: no hearing loss, no epistaxis, no photophobia or visual disturbance. Respiratory: No cough, shortness of breath or wheezing. Cardiovascular: No chest pain peripheral edema, palpitations. Gastrointestinal: No abdominal distention, no abdominal pain, no change in bowel habits hematochezia or melena. Genitourinary: No dysuria, no frequency, no urgency, no nocturia. Musculoskeletal:No arthralgias, no back pain, no gait disturbance or  myalgias. Neurological: No dizziness, no headaches, no numbness, no seizures, no syncope, no weakness, no tremors. Hematologic: No lymphadenopathy, no easy bruising. Psychiatric: No confusion, no hallucinations, no sleep disturbance.    Physical Exam: Filed Vitals:   03/22/11 0845  BP: 128/86  Pulse: 55   the general appearance reveals a Craig [name] gentleman who has some difficulty getting up out of the chair.  He is alert and oriented.  He is in no distress.Pupils equal and reactive.   Extraocular Movements are full.  There is no scleral icterus.  The mouth and pharynx are normal.  The neck is supple.  The carotids reveal no bruits.  The jugular venous pressure is normal.  The thyroid is not enlarged.  There is no lymphadenopathy.  The chest is clear to percussion and auscultation. There are no rales or rhonchi. Expansion of the chest is symmetrical.  The precordium is quiet.  The first heart sound is normal.  The second heart sound is physiologically split.  There is no murmur gallop rub or click.  There is no abnormal lift or heave.  The rhythm is irregular in atrial fibrillation. The abdomen is soft and nontender. Bowel sounds are normal. The liver and spleen are not enlarged. There Are no abdominal masses. There are no bruits.  The pedal pulses are good.  There is no phlebitis or edema.  There is no cyanosis or clubbing. The skin is warm and dry.  There is no rash.        Assessment / Plan: Continue same regimen.  Recheck in 4 months for office visit and EKG.  We will also get fasting lab work and a CBC.

## 2011-03-22 NOTE — Patient Instructions (Signed)
Your physician recommends that you continue on your current medications as directed. Please refer to the Current Medication list given to you today.  Your physician recommends that you schedule a follow-up appointment in: 4 months with fasting labs (lp/bmet/hfp/cbc)  

## 2011-03-22 NOTE — Assessment & Plan Note (Signed)
No further epistaxis.  He is now using bacitracin ointment twice a day intranasally.

## 2011-03-22 NOTE — Assessment & Plan Note (Signed)
We reviewed his lipids from last week which are satisfactory.  He remains on low-dose atorvastatin as well as Zetia

## 2011-03-22 NOTE — Assessment & Plan Note (Signed)
The blood pressure has been maintaining stable on current therapy.

## 2011-03-22 NOTE — Assessment & Plan Note (Signed)
The patient has a history of renal insufficiency.  His renal function has actually improved slightly since last visit.

## 2011-03-23 ENCOUNTER — Other Ambulatory Visit: Payer: Self-pay | Admitting: *Deleted

## 2011-03-23 MED ORDER — EZETIMIBE 10 MG PO TABS: 10.0000 mg | ORAL_TABLET | Freq: Every day | ORAL | Status: DC

## 2011-04-13 ENCOUNTER — Ambulatory Visit (INDEPENDENT_AMBULATORY_CARE_PROVIDER_SITE_OTHER): Payer: Medicare Other | Admitting: Pharmacist

## 2011-04-13 DIAGNOSIS — I4891 Unspecified atrial fibrillation: Secondary | ICD-10-CM

## 2011-04-13 LAB — POCT INR: INR: 3

## 2011-05-25 ENCOUNTER — Ambulatory Visit (INDEPENDENT_AMBULATORY_CARE_PROVIDER_SITE_OTHER): Payer: Medicare Other | Admitting: Pharmacist

## 2011-05-25 DIAGNOSIS — I4891 Unspecified atrial fibrillation: Secondary | ICD-10-CM

## 2011-05-25 LAB — POCT INR: INR: 4

## 2011-06-15 ENCOUNTER — Ambulatory Visit (INDEPENDENT_AMBULATORY_CARE_PROVIDER_SITE_OTHER): Payer: Medicare Other | Admitting: *Deleted

## 2011-06-15 DIAGNOSIS — I4891 Unspecified atrial fibrillation: Secondary | ICD-10-CM

## 2011-06-15 LAB — POCT INR: INR: 3

## 2011-06-21 ENCOUNTER — Other Ambulatory Visit: Payer: Self-pay | Admitting: *Deleted

## 2011-06-21 MED ORDER — FUROSEMIDE 20 MG PO TABS: 20.0000 mg | ORAL_TABLET | Freq: Every day | ORAL | Status: DC

## 2011-06-22 ENCOUNTER — Other Ambulatory Visit: Payer: Self-pay

## 2011-06-22 MED ORDER — WARFARIN SODIUM 5 MG PO TABS: ORAL_TABLET | ORAL | Status: DC

## 2011-07-16 ENCOUNTER — Other Ambulatory Visit (INDEPENDENT_AMBULATORY_CARE_PROVIDER_SITE_OTHER): Payer: Medicare Other

## 2011-07-16 ENCOUNTER — Ambulatory Visit (INDEPENDENT_AMBULATORY_CARE_PROVIDER_SITE_OTHER): Payer: Medicare Other

## 2011-07-16 DIAGNOSIS — I4891 Unspecified atrial fibrillation: Secondary | ICD-10-CM

## 2011-07-16 DIAGNOSIS — I119 Hypertensive heart disease without heart failure: Secondary | ICD-10-CM

## 2011-07-16 DIAGNOSIS — E7800 Pure hypercholesterolemia, unspecified: Secondary | ICD-10-CM

## 2011-07-16 LAB — BASIC METABOLIC PANEL
BUN: 46 mg/dL — ABNORMAL HIGH (ref 6–23)
GFR: 34.35 mL/min — ABNORMAL LOW (ref >60.00)
Potassium: 3.9 mEq/L (ref 3.5–5.1)

## 2011-07-16 LAB — HEPATIC FUNCTION PANEL
Albumin: 3.5 g/dL (ref 3.5–5.2)
Alkaline Phosphatase: 50 U/L (ref 39–117)
Total Bilirubin: 0.9 mg/dL (ref 0.3–1.2)

## 2011-07-16 LAB — CBC WITH DIFFERENTIAL/PLATELET
Eosinophils Relative: 4.6 % (ref 0.0–5.0)
HCT: 40.5 % (ref 39.0–52.0)
Monocytes Relative: 10.8 % (ref 3.0–12.0)
Neutrophils Relative %: 54.3 % (ref 43.0–77.0)
Platelets: 166 10*3/uL (ref 150.0–400.0)
WBC: 8.2 10*3/uL (ref 4.5–10.5)

## 2011-07-16 LAB — LIPID PANEL
LDL Cholesterol: 101 mg/dL — ABNORMAL HIGH (ref 0–99)
Total CHOL/HDL Ratio: 3
Triglycerides: 123 mg/dL (ref 0.0–149.0)

## 2011-07-19 NOTE — Progress Notes (Signed)
Quick Note:  Please make copy of labs for patient visit. ______ 

## 2011-07-22 ENCOUNTER — Encounter: Payer: Self-pay | Admitting: Cardiology

## 2011-07-22 ENCOUNTER — Ambulatory Visit (INDEPENDENT_AMBULATORY_CARE_PROVIDER_SITE_OTHER): Payer: Medicare Other | Admitting: Cardiology

## 2011-07-22 VITALS — BP 133/70 | HR 68 | Resp 18 | Ht 72.0 in | Wt 197.8 lb

## 2011-07-22 DIAGNOSIS — I4891 Unspecified atrial fibrillation: Secondary | ICD-10-CM

## 2011-07-22 DIAGNOSIS — E7800 Pure hypercholesterolemia, unspecified: Secondary | ICD-10-CM

## 2011-07-22 DIAGNOSIS — I119 Hypertensive heart disease without heart failure: Secondary | ICD-10-CM

## 2011-07-22 NOTE — Progress Notes (Signed)
Kathaleen Maser Date of Birth:  1924-03-26 [facility] 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  He has a history of essential hypertension and a previous thrombotic stroke.  He said established atrial fibrillation and is on Coumadin.  He has hypercholesterolemia and essential hypertension.  He also has metastatic prostate cancer.  His last visit he has been doing well.  He is on injections for his bony metastases which have helped and he gets the injections once a month.  He is also on Casodex.  His PSA has been declining.  Current Outpatient Prescriptions  Medication Sig Dispense Refill  . amLODipine (NORVASC) 2.5 MG tablet Take 1 tablet (2.5 mg total) by mouth daily.  30 tablet  11  . aspirin 81 MG tablet Take 81 mg by mouth daily.        Marland Kitchen atorvastatin (LIPITOR) 10 MG tablet Take 1 tablet (10 mg total) by mouth daily.  30 tablet  11  . B Complex-C (B-COMPLEX WITH VITAMIN C) tablet Take 1 tablet by mouth daily.        . bicalutamide (CASODEX) 50 MG tablet Take 50 mg by mouth daily.      . bisoprolol-hydrochlorothiazide (ZIAC) 10-6.25 MG per tablet Take 1 tablet by mouth daily.  90 tablet  3  . calcium carbonate (OS-CAL) 600 MG TABS Take 600 mg by mouth daily. Taking 1200 daily      . Denosumab (XGEVA [place]) Inject into the skin. monthly       . Docusate Calcium (STOOL SOFTENER PO) Take 1 tablet by mouth 2 (two) times daily.       Marland Kitchen ezetimibe (ZETIA) 10 MG tablet Take 1 tablet (10 mg total) by mouth daily.  90 tablet  3  . furosemide (LASIX) 20 MG tablet Take 1 tablet (20 mg total) by mouth daily.  30 tablet  11  . Leuprolide Acetate (LUPRON DEPOT IM) Inject into the muscle every 3 (three) months.       Marland Kitchen losartan-hydrochlorothiazide (HYZAAR) 100-25 MG per tablet Take 1 tablet by mouth daily.  90 tablet  3  . multivitamin (THERAGRAN) per tablet Take  1 tablet by mouth daily. ( with B - Complex )       . Omega-3 Fatty Acids (FISH OIL PO) Take 1 capsule by mouth daily. ( Mega Red )       . warfarin (COUMADIN) 5 MG tablet Take as directed by anticoagulation clinic  30 tablet  3  . potassium chloride SA (K-DUR,KLOR-CON) 20 MEQ tablet Take 1 tablet (20 mEq total) by mouth daily.  30 tablet  11    Allergies  Allergen Reactions  . Pravachol     Muscle weakness  . Zocor (Simvastatin - High Dose)     Muscle weakness    Patient Active Problem List  Diagnoses  . Atrial fibrillation  . CVA, old, ataxia  . Hypercholesterolemia  . Prostate cancer  . Benign hypertensive heart disease without heart failure  . Renal insufficiency  . Epistaxis    History  Smoking status  . Former Smoker  . Types: Cigarettes  . Quit date: 02/23/1948  Smokeless tobacco  . Not on file    History  Alcohol Use No    Family History  Problem Relation Age of Onset  . Heart failure Mother   . Heart attack Father  Review of Systems: The patient denies any heat or cold intolerance.  No weight gain or weight loss.  The patient denies headaches or blurry vision.  There is no cough or sputum production.  The patient denies dizziness.  There is no hematuria or hematochezia.  The patient denies any muscle aches or arthritis.  The patient denies any rash.  The patient denies frequent falling or instability.  There is no history of depression or anxiety.  All other systems were reviewed and are negative.   Physical Exam: Filed Vitals:   07/22/11 0942  BP: 133/70  Pulse: 68  Resp: 18   the general appearance reveals an elderly gentleman in no acute distress.  He is alert and oriented.The head and neck exam reveals pupils equal and reactive.  Extraocular movements are full.  There is no scleral icterus.  The mouth and pharynx are normal.  The neck is supple.  The carotids reveal no bruits.  The jugular venous pressure is normal.  The  thyroid is not enlarged.   There is no lymphadenopathy.  The chest is clear to percussion and auscultation.  There are no rales or rhonchi.  Expansion of the chest is symmetrical.  The precordium is quiet.  The first heart sound is normal.  The second heart sound is physiologically split.  There is no murmur gallop rub or click.  The pulse is slow and irregular in atrial fib. There is no abnormal lift or heave.  The abdomen is soft and nontender.  The bowel sounds are normal.  The liver and spleen are not enlarged.  There are no abdominal masses.  There are no abdominal bruits.  Extremities reveal good pedal pulses.  There is bilateral pedal edema worse on the right.  There is no cyanosis or clubbing.  Strength is normal and symmetrical in all extremities.  There is no lateralizing weakness.  There are no sensory deficits.  The skin is warm and dry.  There is no rash.      Assessment / Plan: Continue same medication and be rechecked in 4 months office visit and fasting lipid panel hepatic function panel basal metabolic panel and CBC

## 2011-07-22 NOTE — Patient Instructions (Signed)
Your physician recommends that you continue on your current medications as directed. Please refer to the Current Medication list given to you today.  Your physician recommends that you schedule a follow-up appointment in: 4 months with fasting labs (lp/bmet/hfp/cbc)  

## 2011-07-22 NOTE — Assessment & Plan Note (Signed)
His lipids are improved.  He credits being on mega-red

## 2011-07-22 NOTE — Assessment & Plan Note (Signed)
No symptoms of congestive heart failure.  No TIA symptoms. 

## 2011-07-23 ENCOUNTER — Other Ambulatory Visit: Payer: Self-pay | Admitting: *Deleted

## 2011-07-23 MED ORDER — POTASSIUM CHLORIDE CRYS ER 20 MEQ PO TBCR: 20.0000 meq | EXTENDED_RELEASE_TABLET | Freq: Every day | ORAL | Status: DC

## 2011-07-23 NOTE — Telephone Encounter (Signed)
Fax Received. Refill Completed. Craig [name] (R.M.A)   

## 2011-08-09 ENCOUNTER — Telehealth: Payer: Self-pay | Admitting: Cardiology

## 2011-08-09 NOTE — Telephone Encounter (Signed)
New problem:  Patient wife calling will be bring by a form on 6/18 for Dr. Patty Sermons to filled out. [employer] Medical Review Form. Will pick back up on  6/ 21.

## 2011-08-09 NOTE — Telephone Encounter (Signed)
Patient's wife states will bring a form tomorrow 08/10/11 from the [employer] Medical Review form, that needs to be done  every 2 years.  Patient was seen by Dr. Patty Sermons on 07/22/11. Patient's wife will peak forms up on 08/13/11.

## 2011-08-10 NOTE — Telephone Encounter (Signed)
Form received and given to Dr. Patty Sermons, he states he will complete it.

## 2011-08-13 ENCOUNTER — Ambulatory Visit (INDEPENDENT_AMBULATORY_CARE_PROVIDER_SITE_OTHER): Payer: Medicare Other | Admitting: Pharmacist

## 2011-08-13 DIAGNOSIS — I4891 Unspecified atrial fibrillation: Secondary | ICD-10-CM

## 2011-08-13 LAB — POCT INR: INR: 3.2

## 2011-09-10 ENCOUNTER — Ambulatory Visit (INDEPENDENT_AMBULATORY_CARE_PROVIDER_SITE_OTHER): Payer: Medicare Other | Admitting: *Deleted

## 2011-09-10 DIAGNOSIS — I4891 Unspecified atrial fibrillation: Secondary | ICD-10-CM

## 2011-09-10 LAB — POCT INR: INR: 2.6

## 2011-10-08 ENCOUNTER — Ambulatory Visit (INDEPENDENT_AMBULATORY_CARE_PROVIDER_SITE_OTHER): Payer: Medicare Other | Admitting: *Deleted

## 2011-10-08 DIAGNOSIS — I4891 Unspecified atrial fibrillation: Secondary | ICD-10-CM

## 2011-10-28 ENCOUNTER — Ambulatory Visit (INDEPENDENT_AMBULATORY_CARE_PROVIDER_SITE_OTHER): Payer: Medicare Other | Admitting: Pharmacist

## 2011-10-28 DIAGNOSIS — I4891 Unspecified atrial fibrillation: Secondary | ICD-10-CM

## 2011-10-28 LAB — POCT INR: INR: 2.9

## 2011-11-16 ENCOUNTER — Other Ambulatory Visit: Payer: Self-pay

## 2011-11-16 DIAGNOSIS — E785 Hyperlipidemia, unspecified: Secondary | ICD-10-CM

## 2011-11-16 MED ORDER — ATORVASTATIN CALCIUM 10 MG PO TABS: 10.0000 mg | ORAL_TABLET | Freq: Every day | ORAL | Status: DC

## 2011-11-16 NOTE — Telephone Encounter (Signed)
..   Requested Prescriptions   Signed Prescriptions Disp Refills  . atorvastatin (LIPITOR) 10 MG tablet 30 tablet 11    Sig: Take 1 tablet (10 mg total) by mouth daily.    Authorizing Provider: [name]    Ordering User: Christella Hartigan, Mennie Spiller Judie Petit

## 2011-11-29 ENCOUNTER — Ambulatory Visit (INDEPENDENT_AMBULATORY_CARE_PROVIDER_SITE_OTHER): Payer: Medicare Other

## 2011-11-29 DIAGNOSIS — I4891 Unspecified atrial fibrillation: Secondary | ICD-10-CM

## 2011-12-03 ENCOUNTER — Other Ambulatory Visit (INDEPENDENT_AMBULATORY_CARE_PROVIDER_SITE_OTHER): Payer: Medicare Other

## 2011-12-03 DIAGNOSIS — I4891 Unspecified atrial fibrillation: Secondary | ICD-10-CM

## 2011-12-03 DIAGNOSIS — I119 Hypertensive heart disease without heart failure: Secondary | ICD-10-CM

## 2011-12-03 DIAGNOSIS — E7800 Pure hypercholesterolemia, unspecified: Secondary | ICD-10-CM

## 2011-12-03 LAB — BASIC METABOLIC PANEL
BUN: 57 mg/dL — ABNORMAL HIGH (ref 6–23)
CO2: 31 mEq/L (ref 19–32)
Chloride: 101 mEq/L (ref 96–112)
Glucose, Bld: 98 mg/dL (ref 70–99)
Potassium: 3.9 mEq/L (ref 3.5–5.1)

## 2011-12-03 LAB — HEPATIC FUNCTION PANEL
ALT: 13 U/L (ref 0–53)
AST: 20 U/L (ref 0–37)
Total Bilirubin: 0.8 mg/dL (ref 0.3–1.2)
Total Protein: 6.7 g/dL (ref 6.0–8.3)

## 2011-12-03 LAB — CBC WITH DIFFERENTIAL/PLATELET
Basophils Relative: 0.6 % (ref 0.0–3.0)
Eosinophils Relative: 6.2 % — ABNORMAL HIGH (ref 0.0–5.0)
Lymphocytes Relative: 33.1 % (ref 12.0–46.0)
Monocytes Relative: 12.1 % — ABNORMAL HIGH (ref 3.0–12.0)
Neutrophils Relative %: 48 % (ref 43.0–77.0)
RBC: 3.91 Mil/uL — ABNORMAL LOW (ref 4.22–5.81)
WBC: 6.3 10*3/uL (ref 4.5–10.5)

## 2011-12-03 LAB — LIPID PANEL
Cholesterol: 164 mg/dL (ref 0–200)
LDL Cholesterol: 96 mg/dL (ref 0–99)

## 2011-12-05 NOTE — Progress Notes (Signed)
Quick Note:  Please make copy of labs for patient visit. ______ 

## 2011-12-06 ENCOUNTER — Other Ambulatory Visit: Payer: Self-pay | Admitting: Dermatology

## 2011-12-07 ENCOUNTER — Ambulatory Visit (INDEPENDENT_AMBULATORY_CARE_PROVIDER_SITE_OTHER): Payer: Medicare Other | Admitting: Cardiology

## 2011-12-07 ENCOUNTER — Encounter: Payer: Self-pay | Admitting: Cardiology

## 2011-12-07 VITALS — BP 130/62 | HR 54 | Resp 18 | Ht 72.0 in | Wt 206.0 lb

## 2011-12-07 DIAGNOSIS — I4891 Unspecified atrial fibrillation: Secondary | ICD-10-CM

## 2011-12-07 DIAGNOSIS — I119 Hypertensive heart disease without heart failure: Secondary | ICD-10-CM

## 2011-12-07 DIAGNOSIS — E7800 Pure hypercholesterolemia, unspecified: Secondary | ICD-10-CM

## 2011-12-07 NOTE — Assessment & Plan Note (Signed)
Blood pressure is improved on current therapy

## 2011-12-07 NOTE — Progress Notes (Signed)
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 He has a history of essential hypertension and a previous thrombotic stroke. He has established atrial fibrillation and is on Coumadin. He has hypercholesterolemia and essential hypertension. He also has metastatic prostate cancer. His last visit he has been doing well. He is on injections for his bony metastases which have helped and he gets the injections once a month. He is also on Casodex. His PSA has been declining.   Current Outpatient Prescriptions  Medication Sig Dispense Refill  . amLODipine (NORVASC) 2.5 MG tablet Take 1 tablet (2.5 mg total) by mouth daily.  30 tablet  11  . aspirin 81 MG tablet Take 81 mg by mouth daily.        Marland Kitchen atorvastatin (LIPITOR) 10 MG tablet Take 1 tablet (10 mg total) by mouth daily.  30 tablet  11  . B Complex-C (B-COMPLEX WITH VITAMIN C) tablet Take 1 tablet by mouth daily.        . bicalutamide (CASODEX) 50 MG tablet Take 50 mg by mouth daily.      . bisoprolol-hydrochlorothiazide (ZIAC) 10-6.25 MG per tablet Take 1 tablet by mouth daily.  90 tablet  3  . calcium carbonate (OS-CAL) 600 MG TABS Take 600 mg by mouth daily. Taking 1200 daily      . Denosumab (XGEVA [place]) Inject into the skin. monthly       . Docusate Calcium (STOOL SOFTENER PO) Take 1 tablet by mouth 2 (two) times daily.       Marland Kitchen ezetimibe (ZETIA) 10 MG tablet Take 1 tablet (10 mg total) by mouth daily.  90 tablet  3  . furosemide (LASIX) 20 MG tablet Take 1 tablet (20 mg total) by mouth daily.  30 tablet  11  . Leuprolide Acetate (LUPRON DEPOT IM) Inject into the muscle every 3 (three) months.       Marland Kitchen losartan-hydrochlorothiazide (HYZAAR) 100-25 MG per tablet Take 1 tablet by mouth daily.  90 tablet  3  . multivitamin (THERAGRAN) per tablet Take 1  tablet by mouth daily. ( with B - Complex )       . Omega-3 Fatty Acids (FISH OIL PO) Take 1 capsule by mouth daily. ( Mega Red )       . potassium chloride SA (K-DUR,KLOR-CON) 20 MEQ tablet Take 20 mEq by mouth daily.      . potassium chloride SA (KLOR-CON M20) 20 MEQ tablet Take 1 tablet (20 mEq total) by mouth daily.  30 tablet  5  . DISCONTD: potassium chloride SA (K-DUR,KLOR-CON) 20 MEQ tablet Take 1 tablet (20 mEq total) by mouth daily.  30 tablet  11  . warfarin (COUMADIN) 5 MG tablet Take as directed by anticoagulation clinic  30 tablet  3    Allergies  Allergen Reactions  . Pravachol     Muscle weakness  . Zocor (Simvastatin - High Dose)     Muscle weakness    Patient Active Problem List  Diagnosis  . Atrial fibrillation  . CVA, old, ataxia  . Hypercholesterolemia  . Prostate cancer  . Benign hypertensive heart disease without heart failure  . Renal insufficiency  . Epistaxis    History  Smoking status  . Former Smoker  . Types: Cigarettes  . Quit date: 02/23/1948  Smokeless tobacco  .  Not on file    History  Alcohol Use No    Family History  Problem Relation Age of Onset  . Heart failure Mother   . Heart attack Father     Review of Systems: The patient denies any heat or cold intolerance.  No weight gain or weight loss.  The patient denies headaches or blurry vision.  There is no cough or sputum production.  The patient denies dizziness.  There is no hematuria or hematochezia.  The patient denies any muscle aches or arthritis.  The patient denies any rash.  The patient denies frequent falling or instability.  There is no history of depression or anxiety.  All other systems were reviewed and are negative.   Physical Exam: Filed Vitals:   12/07/11 0948  BP: 130/62  Pulse: 54  Resp: 18   the general appearance reveals a well-developed elderly gentleman in no distress.  He is mentally alert.The head and neck exam reveals pupils equal and reactive.   Extraocular movements are full.  There is no scleral icterus.  The mouth and pharynx are normal.  The neck is supple.  The carotids reveal no bruits.  The jugular venous pressure is normal.  The  thyroid is not enlarged.  There is no lymphadenopathy.  The chest is clear to percussion and auscultation.  There are no rales or rhonchi.  Expansion of the chest is symmetrical.  The precordium is quiet.  The rhythm is irregular in atrial fibrillation The first heart sound is normal.  The second heart sound is physiologically split.  There is no murmur gallop rub or click.  There is no abnormal lift or heave.  The abdomen is soft and nontender.  The bowel sounds are normal.  The liver and spleen are not enlarged.  There are no abdominal masses.  There are no abdominal bruits.  Extremities reveal good pedal pulses.  There is no phlebitis but there is 2+ pretibial and ankle edema bilaterally.  There is no cyanosis or clubbing.  Strength is normal and symmetrical in all extremities.  There is no lateralizing weakness.  There are no sensory deficits.  The skin is warm and dry.  There is no rash.      Assessment / Plan: The patient is to continue same medication.  His edema is predominantly dependent and he would benefit from using his home treadmill or exercise bicycle.  He also needs to increase his water intake.  Continue same medication and be rechecked in 4 months for office visit and fasting lab work

## 2011-12-07 NOTE — Assessment & Plan Note (Signed)
The patient is on ezetimibe and statins for his cholesterol and his numbers are improving

## 2011-12-07 NOTE — Patient Instructions (Addendum)
Your physician recommends that you continue on your current medications as directed. Please refer to the Current Medication list given to you today.  Your physician recommends that you schedule a follow-up appointment in: 4 months with fasting labs (lp/bmet/hfp/cbc)  

## 2011-12-07 NOTE — Assessment & Plan Note (Signed)
The patient has had no TIA symptoms.  He does have mild peripheral edema but no evidence of congestive heart failure

## 2011-12-20 ENCOUNTER — Ambulatory Visit (INDEPENDENT_AMBULATORY_CARE_PROVIDER_SITE_OTHER): Payer: Medicare Other | Admitting: *Deleted

## 2011-12-20 DIAGNOSIS — I4891 Unspecified atrial fibrillation: Secondary | ICD-10-CM

## 2011-12-20 LAB — POCT INR: INR: 2.7

## 2011-12-29 ENCOUNTER — Other Ambulatory Visit: Payer: Self-pay | Admitting: *Deleted

## 2011-12-29 MED ORDER — AMLODIPINE BESYLATE 2.5 MG PO TABS: 2.5000 mg | ORAL_TABLET | Freq: Every day | ORAL | Status: DC

## 2011-12-30 ENCOUNTER — Other Ambulatory Visit: Payer: Self-pay | Admitting: *Deleted

## 2011-12-30 MED ORDER — WARFARIN SODIUM 5 MG PO TABS: ORAL_TABLET | ORAL | Status: DC

## 2012-01-17 ENCOUNTER — Ambulatory Visit (INDEPENDENT_AMBULATORY_CARE_PROVIDER_SITE_OTHER): Payer: Medicare Other | Admitting: *Deleted

## 2012-01-17 DIAGNOSIS — I4891 Unspecified atrial fibrillation: Secondary | ICD-10-CM

## 2012-01-17 LAB — POCT INR: INR: 2.9

## 2012-02-08 ENCOUNTER — Other Ambulatory Visit: Payer: Self-pay | Admitting: Cardiology

## 2012-02-08 MED ORDER — BISOPROLOL-HYDROCHLOROTHIAZIDE 10-6.25 MG PO TABS: 1.0000 | ORAL_TABLET | Freq: Every day | ORAL | Status: DC

## 2012-02-21 ENCOUNTER — Ambulatory Visit (INDEPENDENT_AMBULATORY_CARE_PROVIDER_SITE_OTHER): Payer: Medicare Other

## 2012-02-21 DIAGNOSIS — I4891 Unspecified atrial fibrillation: Secondary | ICD-10-CM

## 2012-03-17 ENCOUNTER — Other Ambulatory Visit: Payer: Self-pay

## 2012-03-17 MED ORDER — LOSARTAN POTASSIUM-HCTZ 100-25 MG PO TABS: 1.0000 | ORAL_TABLET | Freq: Every day | ORAL | Status: DC

## 2012-03-17 MED ORDER — EZETIMIBE 10 MG PO TABS: 10.0000 mg | ORAL_TABLET | Freq: Every day | ORAL | Status: DC

## 2012-03-20 ENCOUNTER — Ambulatory Visit (INDEPENDENT_AMBULATORY_CARE_PROVIDER_SITE_OTHER): Payer: Medicare Other | Admitting: *Deleted

## 2012-03-20 DIAGNOSIS — I4891 Unspecified atrial fibrillation: Secondary | ICD-10-CM

## 2012-04-07 ENCOUNTER — Other Ambulatory Visit: Payer: Medicare Other

## 2012-04-11 ENCOUNTER — Other Ambulatory Visit (INDEPENDENT_AMBULATORY_CARE_PROVIDER_SITE_OTHER): Payer: Medicare Other

## 2012-04-11 DIAGNOSIS — E7800 Pure hypercholesterolemia, unspecified: Secondary | ICD-10-CM

## 2012-04-11 DIAGNOSIS — I4891 Unspecified atrial fibrillation: Secondary | ICD-10-CM

## 2012-04-11 DIAGNOSIS — I119 Hypertensive heart disease without heart failure: Secondary | ICD-10-CM

## 2012-04-11 LAB — HEPATIC FUNCTION PANEL
ALT: 14 U/L (ref 0–53)
AST: 20 U/L (ref 0–37)
Alkaline Phosphatase: 44 U/L (ref 39–117)
Bilirubin, Direct: 0.1 mg/dL (ref 0.0–0.3)
Total Protein: 6.9 g/dL (ref 6.0–8.3)

## 2012-04-11 LAB — BASIC METABOLIC PANEL
CO2: 33 mEq/L — ABNORMAL HIGH (ref 19–32)
Chloride: 102 mEq/L (ref 96–112)
Sodium: 142 mEq/L (ref 135–145)

## 2012-04-11 LAB — LIPID PANEL: Total CHOL/HDL Ratio: 4

## 2012-04-11 LAB — CBC WITH DIFFERENTIAL/PLATELET
Basophils Absolute: 0.1 10*3/uL (ref 0.0–0.1)
Hemoglobin: 13.6 g/dL (ref 13.0–17.0)
Lymphocytes Relative: 29.2 % (ref 12.0–46.0)
Monocytes Relative: 10.4 % (ref 3.0–12.0)
Neutro Abs: 3.8 10*3/uL (ref 1.4–7.7)
Platelets: 163 10*3/uL (ref 150.0–400.0)
RDW: 14.6 % (ref 11.5–14.6)

## 2012-04-11 NOTE — Progress Notes (Signed)
Quick Note:  Please make copy of labs for patient visit. ______ 

## 2012-04-13 ENCOUNTER — Ambulatory Visit (INDEPENDENT_AMBULATORY_CARE_PROVIDER_SITE_OTHER): Payer: Medicare Other | Admitting: Cardiology

## 2012-04-13 ENCOUNTER — Encounter: Payer: Self-pay | Admitting: Cardiology

## 2012-04-13 ENCOUNTER — Ambulatory Visit (INDEPENDENT_AMBULATORY_CARE_PROVIDER_SITE_OTHER): Payer: Medicare Other | Admitting: *Deleted

## 2012-04-13 VITALS — BP 146/70 | HR 62 | Ht 72.0 in | Wt 199.6 lb

## 2012-04-13 DIAGNOSIS — I4891 Unspecified atrial fibrillation: Secondary | ICD-10-CM

## 2012-04-13 DIAGNOSIS — I119 Hypertensive heart disease without heart failure: Secondary | ICD-10-CM

## 2012-04-13 DIAGNOSIS — E7800 Pure hypercholesterolemia, unspecified: Secondary | ICD-10-CM

## 2012-04-13 LAB — POCT INR: INR: 2.6

## 2012-04-13 NOTE — Patient Instructions (Signed)
Your physician recommends that you continue on your current medications as directed. Please refer to the Current Medication list given to you today.  Your physician recommends that you schedule a follow-up appointment in: 4 months with fasting labs (lp/bmet/hfp/cbc)  

## 2012-04-13 NOTE — Assessment & Plan Note (Addendum)
Patient has a history of hypercholesterolemia and is on Lipitor and ezetimibe.  He is not having any side effects from the statin drugs.  His lipids are stable

## 2012-04-13 NOTE — Assessment & Plan Note (Signed)
The patient's blood pressure has been stable on current medications.  He has lost 7 pounds since last visit and his peripheral edema is also less severe.  He is not having any symptoms of CHF.

## 2012-04-13 NOTE — Assessment & Plan Note (Signed)
The patient has had no TIA symptoms.  He remains on Coumadin for his atrial fibrillation.  He is not having any bleeding problems from the Coumadin.

## 2012-04-13 NOTE — Progress Notes (Addendum)
Craig [name] Date of Birth:  1924-06-25 [facility] 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 He has a history of essential hypertension and a previous thrombotic stroke. He has established atrial fibrillation and is on Coumadin. He has hypercholesterolemia and essential hypertension. He also has metastatic prostate cancer. His last visit he has been doing well. He is on injections for his bony metastases which have helped and he gets the injections once a month. He is also on Casodex. His PSA has been declining.  Since last visit he has not been having any new cardiac symptoms.  Current Outpatient Prescriptions  Medication Sig Dispense Refill  . acetaminophen (TYLENOL) 500 MG tablet Take 500 mg by mouth 2 (two) times daily. Takes one in the AM and one qhs      . amLODipine (NORVASC) 2.5 MG tablet Take 1 tablet (2.5 mg total) by mouth daily.  30 tablet  11  . aspirin 81 MG tablet Take 81 mg by mouth daily.        Marland Kitchen atorvastatin (LIPITOR) 10 MG tablet Take 1 tablet (10 mg total) by mouth daily.  30 tablet  11  . B Complex-C (B-COMPLEX WITH VITAMIN C) tablet Take 1 tablet by mouth daily.        . bicalutamide (CASODEX) 50 MG tablet Take 50 mg by mouth daily.      . bisoprolol-hydrochlorothiazide (ZIAC) 10-6.25 MG per tablet Take 1 tablet by mouth daily.  90 tablet  3  . calcium carbonate (OS-CAL) 600 MG TABS Take 600 mg by mouth daily. Taking 1200 daily      . Denosumab (XGEVA [place]) Inject into the skin. monthly       . Docusate Calcium (STOOL SOFTENER PO) Take 1 tablet by mouth 2 (two) times daily.       Marland Kitchen ezetimibe (ZETIA) 10 MG tablet Take 1 tablet (10 mg total) by mouth daily.  90 tablet  3  . furosemide (LASIX) 20 MG tablet Take 1 tablet (20 mg total) by mouth daily.  30 tablet  11  . Leuprolide Acetate (LUPRON DEPOT IM) Inject into  the muscle every 3 (three) months.       Marland Kitchen losartan-hydrochlorothiazide (HYZAAR) 100-25 MG per tablet Take 1 tablet by mouth daily.  90 tablet  3  . multivitamin (THERAGRAN) per tablet Take 1 tablet by mouth daily. ( with B - Complex )       . Omega-3 Fatty Acids (FISH OIL PO) Take 1 capsule by mouth daily. ( Mega Red )       . potassium chloride SA (K-DUR,KLOR-CON) 20 MEQ tablet Take 20 mEq by mouth daily.      . potassium chloride SA (KLOR-CON M20) 20 MEQ tablet Take 1 tablet (20 mEq total) by mouth daily.  30 tablet  5  . warfarin (COUMADIN) 5 MG tablet Take as directed by anticoagulation clinic  30 tablet  3   No current facility-administered medications for this visit.    Allergies  Allergen Reactions  . Pravachol     Muscle weakness  . Zocor (Simvastatin - High Dose)     Muscle weakness    Patient Active Problem List  Diagnosis  . Atrial fibrillation  . CVA, old, ataxia  . Hypercholesterolemia  . Prostate cancer  . Benign hypertensive heart disease without heart failure  . Renal insufficiency  .  Epistaxis    History  Smoking status  . Former Smoker  . Types: Cigarettes  . Quit date: 02/23/1948  Smokeless tobacco  . Not on file    History  Alcohol Use No    Family History  Problem Relation Age of Onset  . Heart failure Mother   . Heart attack Father     Review of Systems: The patient denies any heat or cold intolerance.  No weight gain or weight loss.  The patient denies headaches or blurry vision.  There is no cough or sputum production.  The patient denies dizziness.  There is no hematuria or hematochezia.  The patient denies any muscle aches or arthritis.  The patient denies any rash.  The patient denies frequent falling or instability.  There is no history of depression or anxiety.  All other systems were reviewed and are negative.   Physical Exam: Filed Vitals:   04/13/12 0852  BP: 146/70  Pulse: 62   General appearance reveals a well-developed  elderly gentleman in no acute distress.  He walks with a walker.The head and neck exam reveals pupils equal and reactive.  Extraocular movements are full.  There is no scleral icterus.  The mouth and pharynx are normal.  The neck is supple.  The carotids reveal no bruits.  The jugular venous pressure is normal.  The  thyroid is not enlarged.  There is no lymphadenopathy.  The chest is clear to percussion and auscultation.  There are no rales or rhonchi.  Expansion of the chest is symmetrical.  The rhythm is irregularly irregular  The precordium is quiet.  The first heart sound is normal.  The second heart sound is physiologically split.  There is no murmur gallop rub or click.  There is no abnormal lift or heave.  The abdomen is soft and nontender.  The bowel sounds are normal.  The liver and spleen are not enlarged.  There are no abdominal masses.  There are no abdominal bruits.  Extremities reveal good pedal pulses.  There is mild bilateral pretibial edema.  There is no cyanosis or clubbing.  Strength is normal and symmetrical in all extremities.  There is no lateralizing weakness.  There are no sensory deficits.  The skin is warm and dry.  There is no rash.     Assessment / Plan: Continue same medication.  Recheck in 4 months for followup office visit lipid panel hepatic function panel basal metabolic panel and CBC.

## 2012-05-11 ENCOUNTER — Ambulatory Visit (INDEPENDENT_AMBULATORY_CARE_PROVIDER_SITE_OTHER): Payer: Medicare Other | Admitting: *Deleted

## 2012-05-11 DIAGNOSIS — I4891 Unspecified atrial fibrillation: Secondary | ICD-10-CM

## 2012-06-05 ENCOUNTER — Other Ambulatory Visit: Payer: Self-pay | Admitting: Dermatology

## 2012-06-16 ENCOUNTER — Other Ambulatory Visit: Payer: Self-pay

## 2012-06-16 MED ORDER — FUROSEMIDE 20 MG PO TABS: 20.0000 mg | ORAL_TABLET | Freq: Every day | ORAL | Status: DC

## 2012-06-22 ENCOUNTER — Ambulatory Visit (INDEPENDENT_AMBULATORY_CARE_PROVIDER_SITE_OTHER): Payer: Medicare Other | Admitting: *Deleted

## 2012-06-22 DIAGNOSIS — I4891 Unspecified atrial fibrillation: Secondary | ICD-10-CM

## 2012-06-22 LAB — POCT INR: INR: 2.6

## 2012-07-03 ENCOUNTER — Other Ambulatory Visit: Payer: Self-pay

## 2012-07-03 MED ORDER — WARFARIN SODIUM 5 MG PO TABS: ORAL_TABLET | ORAL | Status: DC

## 2012-07-04 ENCOUNTER — Other Ambulatory Visit: Payer: Self-pay | Admitting: *Deleted

## 2012-07-04 MED ORDER — WARFARIN SODIUM 5 MG PO TABS: ORAL_TABLET | ORAL | Status: DC

## 2012-07-25 ENCOUNTER — Other Ambulatory Visit (HOSPITAL_COMMUNITY): Payer: Self-pay | Admitting: Urology

## 2012-07-25 DIAGNOSIS — C61 Malignant neoplasm of prostate: Secondary | ICD-10-CM

## 2012-07-27 ENCOUNTER — Other Ambulatory Visit: Payer: Self-pay | Admitting: *Deleted

## 2012-07-27 MED ORDER — POTASSIUM CHLORIDE CRYS ER 20 MEQ PO TBCR: 20.0000 meq | EXTENDED_RELEASE_TABLET | Freq: Every day | ORAL | Status: DC

## 2012-07-31 ENCOUNTER — Ambulatory Visit (INDEPENDENT_AMBULATORY_CARE_PROVIDER_SITE_OTHER): Payer: Medicare Other | Admitting: *Deleted

## 2012-07-31 DIAGNOSIS — I4891 Unspecified atrial fibrillation: Secondary | ICD-10-CM

## 2012-08-02 ENCOUNTER — Telehealth: Payer: Self-pay | Admitting: Cardiology

## 2012-08-02 NOTE — Telephone Encounter (Signed)
New Prob    States husband took the wrong dosage of COUMADIN last night. Wife would like to know how to adjust future dosages. Please call.

## 2012-08-02 NOTE — Telephone Encounter (Signed)
Telephoned spouse and dose instructions given to skip todays dose since pt took extra yesterday then resume normal dose.

## 2012-08-07 ENCOUNTER — Encounter (HOSPITAL_COMMUNITY)
Admission: RE | Admit: 2012-08-07 | Discharge: 2012-08-07 | Disposition: A | Discharge status: Home / Self Care | Payer: Medicare Other | Source: Ambulatory Visit | Attending: Urology | Admitting: Urology

## 2012-08-07 ENCOUNTER — Encounter (HOSPITAL_COMMUNITY): Payer: Self-pay

## 2012-08-07 DIAGNOSIS — C61 Malignant neoplasm of prostate: Secondary | ICD-10-CM | POA: Insufficient documentation

## 2012-08-07 MED ORDER — TECHNETIUM TC 99M MEDRONATE IV KIT
25.0000 | PACK | Freq: Once | INTRAVENOUS | Status: AC | PRN
  Administered 2012-08-07: 25.4 via INTRAVENOUS

## 2012-09-11 ENCOUNTER — Ambulatory Visit (INDEPENDENT_AMBULATORY_CARE_PROVIDER_SITE_OTHER): Payer: Medicare Other | Admitting: *Deleted

## 2012-09-11 DIAGNOSIS — I4891 Unspecified atrial fibrillation: Secondary | ICD-10-CM

## 2012-09-27 ENCOUNTER — Observation Stay (HOSPITAL_COMMUNITY)
Admission: EM | Admit: 2012-09-27 | Discharge: 2012-09-29 | Disposition: A | Discharge status: Home / Self Care | Payer: Medicare Other | Attending: Internal Medicine | Admitting: Internal Medicine

## 2012-09-27 ENCOUNTER — Encounter (HOSPITAL_COMMUNITY): Payer: Self-pay | Admitting: Emergency Medicine

## 2012-09-27 ENCOUNTER — Emergency Department (HOSPITAL_COMMUNITY): Payer: Medicare Other

## 2012-09-27 DIAGNOSIS — M6281 Muscle weakness (generalized): Principal | ICD-10-CM | POA: Insufficient documentation

## 2012-09-27 DIAGNOSIS — C61 Malignant neoplasm of prostate: Secondary | ICD-10-CM

## 2012-09-27 DIAGNOSIS — I119 Hypertensive heart disease without heart failure: Secondary | ICD-10-CM

## 2012-09-27 DIAGNOSIS — R0902 Hypoxemia: Secondary | ICD-10-CM

## 2012-09-27 DIAGNOSIS — I4891 Unspecified atrial fibrillation: Secondary | ICD-10-CM

## 2012-09-27 DIAGNOSIS — I1310 Hypertensive heart and chronic kidney disease without heart failure, with stage 1 through stage 4 chronic kidney disease, or unspecified chronic kidney disease: Secondary | ICD-10-CM | POA: Insufficient documentation

## 2012-09-27 DIAGNOSIS — R531 Weakness: Secondary | ICD-10-CM | POA: Diagnosis present

## 2012-09-27 DIAGNOSIS — E7800 Pure hypercholesterolemia, unspecified: Secondary | ICD-10-CM | POA: Insufficient documentation

## 2012-09-27 DIAGNOSIS — Z9181 History of falling: Secondary | ICD-10-CM | POA: Insufficient documentation

## 2012-09-27 DIAGNOSIS — R7989 Other specified abnormal findings of blood chemistry: Secondary | ICD-10-CM | POA: Diagnosis present

## 2012-09-27 DIAGNOSIS — R5381 Other malaise: Secondary | ICD-10-CM

## 2012-09-27 DIAGNOSIS — R209 Unspecified disturbances of skin sensation: Secondary | ICD-10-CM | POA: Insufficient documentation

## 2012-09-27 DIAGNOSIS — G459 Transient cerebral ischemic attack, unspecified: Secondary | ICD-10-CM

## 2012-09-27 DIAGNOSIS — Z8673 Personal history of transient ischemic attack (TIA), and cerebral infarction without residual deficits: Secondary | ICD-10-CM | POA: Insufficient documentation

## 2012-09-27 DIAGNOSIS — Z79899 Other long term (current) drug therapy: Secondary | ICD-10-CM | POA: Insufficient documentation

## 2012-09-27 DIAGNOSIS — C7951 Secondary malignant neoplasm of bone: Secondary | ICD-10-CM | POA: Insufficient documentation

## 2012-09-27 DIAGNOSIS — R269 Unspecified abnormalities of gait and mobility: Secondary | ICD-10-CM | POA: Insufficient documentation

## 2012-09-27 DIAGNOSIS — N184 Chronic kidney disease, stage 4 (severe): Secondary | ICD-10-CM | POA: Insufficient documentation

## 2012-09-27 DIAGNOSIS — R799 Abnormal finding of blood chemistry, unspecified: Secondary | ICD-10-CM | POA: Insufficient documentation

## 2012-09-27 DIAGNOSIS — I482 Chronic atrial fibrillation, unspecified: Secondary | ICD-10-CM | POA: Diagnosis present

## 2012-09-27 DIAGNOSIS — R29898 Other symptoms and signs involving the musculoskeletal system: Secondary | ICD-10-CM | POA: Insufficient documentation

## 2012-09-27 DIAGNOSIS — Z7901 Long term (current) use of anticoagulants: Secondary | ICD-10-CM | POA: Insufficient documentation

## 2012-09-27 HISTORY — DX: Migraine, unspecified, not intractable, without status migrainosus: G43.909

## 2012-09-27 HISTORY — DX: Primary osteoarthritis, left ankle and foot: M19.072

## 2012-09-27 HISTORY — DX: Other supraventricular tachycardia: I47.19

## 2012-09-27 HISTORY — DX: Personal history of other diseases of the digestive system: Z87.19

## 2012-09-27 HISTORY — DX: Supraventricular tachycardia: I47.1

## 2012-09-27 HISTORY — DX: Nontraumatic intracerebral hemorrhage, unspecified: I61.9

## 2012-09-27 HISTORY — DX: Primary osteoarthritis, left ankle and foot: M19.071

## 2012-09-27 HISTORY — DX: Gastro-esophageal reflux disease without esophagitis: K21.9

## 2012-09-27 HISTORY — DX: Pneumonia, unspecified organism: J18.9

## 2012-09-27 LAB — BASIC METABOLIC PANEL
BUN: 52 mg/dL — ABNORMAL HIGH (ref 6–23)
Creatinine, Ser: 2.13 mg/dL — ABNORMAL HIGH (ref 0.50–1.35)
GFR calc Af Amer: 31 mL/min — ABNORMAL LOW (ref >90)
GFR calc non Af Amer: 27 mL/min — ABNORMAL LOW (ref >90)
Glucose, Bld: 114 mg/dL — ABNORMAL HIGH (ref 70–99)
Potassium: 3.6 mEq/L (ref 3.5–5.1)

## 2012-09-27 LAB — URINALYSIS, ROUTINE W REFLEX MICROSCOPIC
Glucose, UA: NEGATIVE mg/dL
Leukocytes, UA: NEGATIVE
Protein, ur: NEGATIVE mg/dL
Specific Gravity, Urine: 1.018 (ref 1.005–1.030)
Urobilinogen, UA: 0.2 mg/dL (ref 0.0–1.0)

## 2012-09-27 LAB — CBC WITH DIFFERENTIAL/PLATELET
Basophils Relative: 0 % (ref 0–1)
Eosinophils Absolute: 0.1 10*3/uL (ref 0.0–0.7)
HCT: 39 % (ref 39.0–52.0)
Hemoglobin: 13.6 g/dL (ref 13.0–17.0)
Lymphs Abs: 2 10*3/uL (ref 0.7–4.0)
MCH: 33.5 pg (ref 26.0–34.0)
MCHC: 34.9 g/dL (ref 30.0–36.0)
MCV: 96.1 fL (ref 78.0–100.0)
Monocytes Absolute: 1.3 10*3/uL — ABNORMAL HIGH (ref 0.1–1.0)
Monocytes Relative: 13 % — ABNORMAL HIGH (ref 3–12)
RBC: 4.06 MIL/uL — ABNORMAL LOW (ref 4.22–5.81)

## 2012-09-27 LAB — POCT I-STAT TROPONIN I: Troponin i, poc: 0 ng/mL (ref 0.00–0.08)

## 2012-09-27 LAB — PROTIME-INR: Prothrombin Time: 25 seconds — ABNORMAL HIGH (ref 11.6–15.2)

## 2012-09-27 LAB — APTT: aPTT: 39 seconds — ABNORMAL HIGH (ref 24–37)

## 2012-09-27 LAB — PRO B NATRIURETIC PEPTIDE: Pro B Natriuretic peptide (BNP): 6386 pg/mL — ABNORMAL HIGH (ref 0–450)

## 2012-09-27 LAB — ALBUMIN: Albumin: 3.3 g/dL — ABNORMAL LOW (ref 3.5–5.2)

## 2012-09-27 MED ORDER — SENNOSIDES-DOCUSATE SODIUM 8.6-50 MG PO TABS
1.0000 | ORAL_TABLET | Freq: Every evening | ORAL | Status: DC | PRN
  Filled 2012-09-27: qty 1

## 2012-09-27 MED ORDER — EZETIMIBE 10 MG PO TABS
10.0000 mg | ORAL_TABLET | Freq: Every day | ORAL | Status: DC
  Administered 2012-09-27 – 2012-09-29 (×3): 10 mg via ORAL
  Filled 2012-09-27 (×3): qty 1

## 2012-09-27 MED ORDER — HYDRALAZINE HCL 20 MG/ML IJ SOLN
5.0000 mg | INTRAMUSCULAR | Status: DC | PRN
  Administered 2012-09-27: 5 mg via INTRAVENOUS
  Filled 2012-09-27: qty 1

## 2012-09-27 MED ORDER — FUROSEMIDE 20 MG PO TABS: 20.0000 mg | ORAL_TABLET | Freq: Every day | ORAL | Status: DC

## 2012-09-27 MED ORDER — WARFARIN SODIUM 2.5 MG PO TABS
2.5000 mg | ORAL_TABLET | Freq: Every day | ORAL | Status: DC
  Administered 2012-09-27: 2.5 mg via ORAL
  Filled 2012-09-27 (×2): qty 1

## 2012-09-27 MED ORDER — AMLODIPINE BESYLATE 2.5 MG PO TABS
2.5000 mg | ORAL_TABLET | Freq: Every day | ORAL | Status: DC
  Administered 2012-09-27 – 2012-09-29 (×3): 2.5 mg via ORAL
  Filled 2012-09-27 (×4): qty 1

## 2012-09-27 MED ORDER — ONDANSETRON HCL 4 MG PO TABS: 4.0000 mg | ORAL_TABLET | Freq: Four times a day (QID) | ORAL | Status: DC | PRN

## 2012-09-27 MED ORDER — BISOPROLOL-HYDROCHLOROTHIAZIDE 10-6.25 MG PO TABS: 1.0000 | ORAL_TABLET | Freq: Every day | ORAL | Status: DC

## 2012-09-27 MED ORDER — ONDANSETRON HCL 4 MG/2ML IJ SOLN: 4.0000 mg | Freq: Four times a day (QID) | INTRAMUSCULAR | Status: DC | PRN

## 2012-09-27 MED ORDER — DOCUSATE SODIUM 100 MG PO CAPS
100.0000 mg | ORAL_CAPSULE | Freq: Two times a day (BID) | ORAL | Status: DC
  Administered 2012-09-27 – 2012-09-29 (×4): 100 mg via ORAL
  Filled 2012-09-27 (×5): qty 1

## 2012-09-27 MED ORDER — AMLODIPINE BESYLATE 2.5 MG PO TABS: 2.5000 mg | ORAL_TABLET | Freq: Every day | ORAL | Status: DC

## 2012-09-27 MED ORDER — WARFARIN - PHYSICIAN DOSING INPATIENT: Freq: Every day | Status: DC

## 2012-09-27 MED ORDER — ADULT MULTIVITAMIN W/MINERALS CH
1.0000 | ORAL_TABLET | Freq: Every day | ORAL | Status: DC
  Administered 2012-09-27 – 2012-09-29 (×3): 1 via ORAL
  Filled 2012-09-27 (×3): qty 1

## 2012-09-27 MED ORDER — ATORVASTATIN CALCIUM 10 MG PO TABS
10.0000 mg | ORAL_TABLET | Freq: Every day | ORAL | Status: DC
  Administered 2012-09-27 – 2012-09-29 (×3): 10 mg via ORAL
  Filled 2012-09-27 (×3): qty 1

## 2012-09-27 MED ORDER — ASPIRIN EC 81 MG PO TBEC
81.0000 mg | DELAYED_RELEASE_TABLET | Freq: Every day | ORAL | Status: DC
  Administered 2012-09-27 – 2012-09-29 (×3): 81 mg via ORAL
  Filled 2012-09-27 (×3): qty 1

## 2012-09-27 MED ORDER — CALCIUM CARBONATE 600 MG PO TABS: 600.0000 mg | ORAL_TABLET | Freq: Two times a day (BID) | ORAL | Status: DC

## 2012-09-27 MED ORDER — FUROSEMIDE 20 MG PO TABS
20.0000 mg | ORAL_TABLET | Freq: Every day | ORAL | Status: DC
  Administered 2012-09-28 – 2012-09-29 (×2): 20 mg via ORAL
  Filled 2012-09-27 (×2): qty 1

## 2012-09-27 MED ORDER — ACETAMINOPHEN 500 MG PO TABS
500.0000 mg | ORAL_TABLET | Freq: Two times a day (BID) | ORAL | Status: DC
  Administered 2012-09-27 – 2012-09-29 (×4): 500 mg via ORAL
  Filled 2012-09-27 (×5): qty 1

## 2012-09-27 MED ORDER — LOSARTAN POTASSIUM 50 MG PO TABS
100.0000 mg | ORAL_TABLET | Freq: Every day | ORAL | Status: DC
  Administered 2012-09-28 – 2012-09-29 (×2): 100 mg via ORAL
  Filled 2012-09-27 (×2): qty 2

## 2012-09-27 MED ORDER — BISOPROLOL FUMARATE 10 MG PO TABS
10.0000 mg | ORAL_TABLET | Freq: Every day | ORAL | Status: DC
  Administered 2012-09-28 – 2012-09-29 (×2): 10 mg via ORAL
  Filled 2012-09-27 (×2): qty 1

## 2012-09-27 MED ORDER — B COMPLEX-C PO TABS
1.0000 | ORAL_TABLET | Freq: Every day | ORAL | Status: DC
  Administered 2012-09-27 – 2012-09-29 (×3): 1 via ORAL
  Filled 2012-09-27 (×3): qty 1

## 2012-09-27 MED ORDER — SODIUM CHLORIDE 0.9 % IJ SOLN
3.0000 mL | Freq: Two times a day (BID) | INTRAMUSCULAR | Status: DC
  Administered 2012-09-27 – 2012-09-29 (×4): 3 mL via INTRAVENOUS

## 2012-09-27 MED ORDER — BICALUTAMIDE 50 MG PO TABS
50.0000 mg | ORAL_TABLET | Freq: Every day | ORAL | Status: DC
  Administered 2012-09-28 – 2012-09-29 (×2): 50 mg via ORAL
  Filled 2012-09-27 (×2): qty 1

## 2012-09-27 MED ORDER — CALCIUM CARBONATE 1250 (500 CA) MG PO TABS
1.0000 | ORAL_TABLET | Freq: Two times a day (BID) | ORAL | Status: DC
  Administered 2012-09-28 – 2012-09-29 (×3): 500 mg via ORAL
  Filled 2012-09-27 (×5): qty 1

## 2012-09-27 MED ORDER — POTASSIUM CHLORIDE CRYS ER 20 MEQ PO TBCR
20.0000 meq | EXTENDED_RELEASE_TABLET | Freq: Every day | ORAL | Status: DC
  Administered 2012-09-27 – 2012-09-29 (×3): 20 meq via ORAL
  Filled 2012-09-27 (×3): qty 1

## 2012-09-27 MED ORDER — LOSARTAN POTASSIUM-HCTZ 100-25 MG PO TABS: 1.0000 | ORAL_TABLET | Freq: Every day | ORAL | Status: DC

## 2012-09-27 NOTE — H&P (Signed)
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  presents with weakness, worsening over several months.  Wife noted leaning to the side while walking with walker.  No other symptoms. CT brain negative. H/o chronic atrial fib on coumadin, which is therapeutic.  Pt evaluated by neuro who recommend MRI of brain and spine.  EDP noted left leg weakness. proBNP greater than 6000, but CXR negative, and patient denies dyspnea, orthopnea  Review of Systems: systems reviewed. As above, otherwise negative  Past Medical History  Diagnosis Date  . Hyperlipidemia   . Hypokalemia   . Hypertensive heart disease   . Chronic back pain   . History of epistaxis 07/19/2002  . Personal history of long-term (current) use of anticoagulants   . ICH (intracerebral hemorrhage) ~ 1999    after TPA/notes 09/27/2012  . Hypertension   . Atrial fibrillation   . PAT (paroxysmal atrial tachycardia)   . Pneumonia     "once; several years ago" (09/27/2012)  . GERD (gastroesophageal reflux disease)   . H/O hiatal hernia   . Migraines     "migraines without headaches years ago" (09/27/2012)  . Stroke ~ 1999    ischemic / right cerebellar/posterior inferior cerebellar artery / right pos infarct  . Melanoma     "top of my head" (09/27/2012)  . Prostate cancer, primary, with metastasis from prostate to other site     on Lupron injections per GU  . Osteoarthritis of both feet    Past Surgical History  Procedure Laterality Date  . Nasal sinus surgery  1998  . Cataract extraction w/ intraocular lens  implant, bilateral Bilateral ~ 2011  . Skin graft Right 2010  . Melanoma excision  2010    "pre-melanoma on top of head; did skin graft from left thigh to cover" (09/27/2012)  . Tonsillectomy  ~ 1935    Social History:  reports that he quit smoking about 64 years ago. His smoking use included Cigarettes. He smoked 0.00 packs per day for .5 years. He has never used smokeless tobacco. He reports that  drinks alcohol. He reports that he does not use illicit drugs. Lives with wife. Walks with walker  Allergies  Allergen Reactions  . Pravachol     Muscle weakness  . Zocor (Simvastatin - High Dose)     Muscle weakness    Family History  Problem Relation Age of Onset  . Heart failure Mother   . Heart attack Father     Prior to Admission medications   Medication Sig Start Date End Date Taking? Authorizing Provider  acetaminophen (TYLENOL) 500 MG tablet Take 500 mg by mouth 2 (two) times daily. Takes one in the AM and one qhs   Yes Historical Provider, MD  amLODipine (NORVASC) 2.5 MG tablet Take 1 tablet (2.5 mg total) by mouth daily. 12/29/11 12/28/12 Yes Rosalio Macadamia, NP  aspirin 81 MG tablet Take 81 mg by mouth daily.     Yes Historical Provider, MD  atorvastatin (LIPITOR) 10 MG tablet Take 1 tablet (10 mg total) by mouth daily. 11/16/11  Yes [name], MD  B Complex-C (B-COMPLEX WITH VITAMIN C) tablet Take 1 tablet by mouth daily.     Yes Historical Provider, MD  bicalutamide (CASODEX) 50 MG tablet Take 50 mg by  mouth daily.   Yes Historical Provider, MD  bisoprolol-hydrochlorothiazide (ZIAC) 10-6.25 MG per tablet Take 1 tablet by mouth daily. 02/08/12  Yes [name], MD  calcium carbonate (OS-CAL) 600 MG TABS Take 600 mg by mouth 2 (two) times daily. Taking 1200 daily   Yes Historical Provider, MD  Denosumab (XGEVA [place]) Inject into the skin. monthly    Yes Historical Provider, MD  Docusate Calcium (STOOL SOFTENER PO) Take 1 tablet by mouth 2 (two) times daily.    Yes Historical Provider, MD  ezetimibe (ZETIA) 10 MG tablet Take 1 tablet (10 mg total) by mouth daily. 03/17/12  Yes [name], MD  furosemide (LASIX) 20 MG tablet Take 1 tablet (20 mg total) by mouth  daily. 06/16/12 06/16/13 Yes [name], MD  losartan-hydrochlorothiazide (HYZAAR) 100-25 MG per tablet Take 1 tablet by mouth daily. 03/17/12  Yes [name], MD  multivitamin [facility]) per tablet Take 1 tablet by mouth daily. ( with B - Complex )   Yes Historical Provider, MD  Omega-3 Fatty Acids (FISH OIL PO) Take 1 capsule by mouth daily. ( Mega Red )   Yes Historical Provider, MD  potassium chloride SA (K-DUR,KLOR-CON) 20 MEQ tablet Take 1 tablet (20 mEq total) by mouth daily. 07/27/12  Yes [name], MD  warfarin (COUMADIN) 5 MG tablet Take 5 mg by mouth daily. Take 5mg   Monday and fridays and half a tablet every other day   Yes Historical Provider, MD  Leuprolide Acetate (LUPRON DEPOT IM) Inject into the muscle every 3 (three) months.     Historical Provider, MD   Physical Exam: Filed Vitals:   09/27/12 1648  BP: 164/50  Pulse: 63  Temp: 98.1 F (36.7 C)  Resp: 17   BP 164/50  Pulse 63  Temp(Src) 98.1 F (36.7 C) (Oral)  Resp 17  Ht 6' (1.829 m)  Wt 92.987 kg (205 lb)  BMI 27.8 kg/m2  SpO2 99%  General Appearance:    Alert, cooperative, no distress, appears stated age. Masked facies  Head:    Normocephalic, without obvious abnormality, atraumatic  Eyes:    PERRL, conjunctiva/corneas clear, EOM's intact, fundi    benign, both eyes       Ears:    Normal TM's and external ear canals, both ears  Nose:   Nares normal, septum midline, mucosa normal, no drainage   or sinus tenderness  Throat:   Lips, mucosa, and tongue normal; teeth and gums normal  Neck:   Supple, symmetrical, trachea midline, no adenopathy;       thyroid:  No enlargement/tenderness/nodules; no carotid   bruit or JVD  Back:     Symmetric, no curvature, ROM normal, no CVA tenderness  Lungs:     Clear to auscultation bilaterally, respirations unlabored  Chest wall:    No tenderness or deformity  Heart:    Regular rate and rhythm, S1 and S2 normal, no murmur, rub   or gallop  Abdomen:      Soft, non-tender, bowel sounds active all four quadrants,    no masses, no organomegaly  Genitalia:    deferred  Rectal:    deferred  Extremities:   Extremities normal, atraumatic, no cyanosis or edema  Pulses:   2+ and symmetric all extremities  Skin:   Skin color, texture, turgor normal, no rashes or lesions  Lymph nodes:   Cervical, supraclavicular, and axillary nodes normal  Neurologic:   CNII-XII intact. Normal strength, decreased sensation legs    Psych: normal  affect. Calm and cooperative  Labs on Admission:  Basic Metabolic Panel:  Recent Labs Lab 09/27/12 1045  NA 140  K 3.6  CL 98  CO2 32  GLUCOSE 114*  BUN 52*  CREATININE 2.13*  CALCIUM 11.8*  MG 1.8   Liver Function Tests:  Recent Labs Lab 09/27/12 1750  ALBUMIN 3.3*   No results found for this basename: LIPASE, AMYLASE,  in the last 168 hours No results found for this basename: AMMONIA,  in the last 168 hours CBC:  Recent Labs Lab 09/27/12 1045  WBC 9.9  NEUTROABS 6.5  HGB 13.6  HCT 39.0  MCV 96.1  PLT 150   Cardiac Enzymes: No results found for this basename: CKTOTAL, CKMB, CKMBINDEX, TROPONINI,  in the last 168 hours  BNP (last 3 results)  Recent Labs  09/27/12 1045  PROBNP 6386.0*   CBG: No results found for this basename: GLUCAP,  in the last 168 hours  Radiological Exams on Admission: Dg Chest 2 View  09/27/2012   *RADIOLOGY REPORT*  Clinical Data: Intermittent chest pain, weakness  CHEST - 2 VIEW  Comparison: Portable chest x-ray of 04/29/2009  Findings: Moderate cardiomegaly is stable.  No active infiltrate or effusion is seen.  No acute bony abnormality is noted.  Lower thoracic vertebroplasty is present.  IMPRESSION:  Stable moderate cardiomegaly.  No active lung disease.   Original Report Authenticated By: Dwyane Dee, M.D.   Ct Head Wo Contrast  09/27/2012   *RADIOLOGY REPORT*  Clinical Data: Left-sided weakness.  History of prior cerebrovascular accident.  The patient is on  Coumadin.  CT HEAD WITHOUT CONTRAST  Technique:  Contiguous axial images were obtained from the base of the skull through the vertex without contrast.  Comparison: No priors.  Findings: Extensive atrophy and low attenuation throughout the right cerebellar hemisphere, compatible with encephalomalacia from prior infarction.  Similar findings are present to a lesser degree in the posterior aspect of the left cerebellar hemisphere as well. A focal area of low attenuation in the left frontal cortex and subcortical white matter, compatible with encephalomalacia from old left frontal lobe infarction.  Patchy areas of mild decreased attenuation in the deep and periventricular white matter of the cerebral hemispheres bilaterally, most compatible with mild chronic microvascular ischemic disease. Mild cerebral atrophy.  No acute intracranial abnormalities.  Specifically, no evidence of acute intracranial hemorrhage, no definite findings of acute/subacute cerebral ischemia, no mass, mass effect, hydrocephalus or abnormal intra or extra-axial fluid collections.  Visualized paranasal sinuses and mastoids are well pneumatized.  No acute displaced skull fractures are identified.  IMPRESSION: 1.  No acute intracranial abnormalities.  Specifically, no evidence of acute intracranial hemorrhage. 2.  Mild chronic microvascular ischemic changes in the cerebral white matter with areas of encephalomalacia in the left frontal lobe and bilateral cerebellar hemispheres (right much greater than left), related to remote infarctions. 3.  Mild cerebral atrophy.   Original Report Authenticated By: Trudie Reed, M.D.    EKG: Atrial fibrillation Left anterior fascicular block Consider anterior infarct  Assessment/Plan Principal Problem:   Weakness MRI of brain, cspine and tspine ordered by neuro. Will also check B12, TSH, folate. PT/OT eval. Continue coumadin. Hypercalemia may be contributing.  Check albumin and ionized calcium. Stop  HCTZ Active Problems:   Atrial fibrillation, rate controlled on coumadin   Chronic kidney disease (CKD), stage IV (severe): at baseline   Elevated brain natriuretic peptide (BNP) level: no clinical evidence of CHF.  Last echo in 2011 showed diastolic  dysfunction and preserved EF.  Will repeat echo and continue lasix   Hypercalcemia: see above   Hypercholesterolemia   Prostate cancer, metastatic  Family Communication: none available Disposition Plan: home  Time spent: 60 minutes  Milly Goggins L [facility] Pager [phone]  If 7PM-7AM, please contact night-coverage [email or web address] Password [email or web address] 09/27/2012, 9:07 PM

## 2012-09-27 NOTE — ED Notes (Signed)
Pt brought to ED by EMS with increase weakness felt today.He gives the history that he had stroke 10 years ago .

## 2012-09-27 NOTE — ED Provider Notes (Signed)
CSN: [id number]     Arrival date & time 09/27/12  0950 History     First MD Initiated Contact with Patient 09/27/12 [phone]     Chief Complaint  Patient presents with  . Weakness   (Consider location/radiation/quality/duration/timing/severity/associated sxs/prior Treatment) HPI  Pt is an 79 yo M with h/o HTN, metastatic prostate cancer, a fib on coumadin, prior CVA who received TPA and had subsequent ICH with minimal residual deficits who presents to the ED with generalized weakness, worse in his LE since yesterday.  Wife noticed that pt had difficulty getting downstairs yesterday and this morning was leaning to the left and kept walking his walker into the wall.  Also had difficulty standing in the shower.  No recent head injury.  No HA, fever, cough, SOB, N/V/D, bloody or black, tarry stools.  Pt has had some mild chest discomfort at rest intermittently for several weeks that lasts several minutes and spontaneously resolves.  No chest pain today.  Past Medical History  Diagnosis Date  . Stroke     ischemic / right cerebellar/posterior inferior cerebellar artery / right pos infarct  . Hyperlipidemia   . Melanoma   . Hypokalemia   . Atrial fibrillation   . Hypertensive heart disease   . Chronic back pain   . History of epistaxis 07/19/2002  . Personal history of long-term (current) use of anticoagulants   . Prostate cancer, primary, with metastasis from prostate to other site     on Lupron injections per GU   Past Surgical History  Procedure Laterality Date  . Nasal sinus surgery  1998  . Tonsillectomy    . Skin graft    . Melanoma excision      last surgery for melanoma was 2010   Family History  Problem Relation Age of Onset  . Heart failure Mother   . Heart attack Father    History  Substance Use Topics  . Smoking status: Former Smoker    Types: Cigarettes    Quit date: 02/23/1948  . Smokeless tobacco: Not on file  . Alcohol Use: No    Review of Systems   Constitutional: Negative for fever and chills.  Respiratory: Negative for cough and shortness of breath.   Cardiovascular: Positive for chest pain. Negative for leg swelling.  Gastrointestinal: Negative for nausea, vomiting, diarrhea and blood in stool.  Genitourinary: Negative for dysuria.  Musculoskeletal: Positive for gait problem.  Neurological: Positive for weakness. Negative for syncope and numbness.  Psychiatric/Behavioral: Negative for confusion.  All other systems reviewed and are negative.    Allergies  Pravachol and Zocor  Home Medications   Current Outpatient Rx  Name  Route  Sig  Dispense  Refill  . acetaminophen (TYLENOL) 500 MG tablet   Oral   Take 500 mg by mouth 2 (two) times daily. Takes one in the AM and one qhs         . amLODipine (NORVASC) 2.5 MG tablet   Oral   Take 1 tablet (2.5 mg total) by mouth daily.   30 tablet   11   . aspirin 81 MG tablet   Oral   Take 81 mg by mouth daily.           Marland Kitchen atorvastatin (LIPITOR) 10 MG tablet   Oral   Take 1 tablet (10 mg total) by mouth daily.   30 tablet   11   . B Complex-C (B-COMPLEX WITH VITAMIN C) tablet   Oral   Take 1  tablet by mouth daily.           . bicalutamide (CASODEX) 50 MG tablet   Oral   Take 50 mg by mouth daily.         . bisoprolol-hydrochlorothiazide (ZIAC) 10-6.25 MG per tablet   Oral   Take 1 tablet by mouth daily.   90 tablet   3   . calcium carbonate (OS-CAL) 600 MG TABS   Oral   Take 600 mg by mouth daily. Taking 1200 daily         . Denosumab (XGEVA [place])   Subcutaneous   Inject into the skin. monthly          . Docusate Calcium (STOOL SOFTENER PO)   Oral   Take 1 tablet by mouth 2 (two) times daily.          Marland Kitchen ezetimibe (ZETIA) 10 MG tablet   Oral   Take 1 tablet (10 mg total) by mouth daily.   90 tablet   3   . furosemide (LASIX) 20 MG tablet   Oral   Take 1 tablet (20 mg total) by mouth daily.   30 tablet   11   . Leuprolide Acetate  (LUPRON DEPOT IM)   Intramuscular   Inject into the muscle every 3 (three) months.          Marland Kitchen losartan-hydrochlorothiazide (HYZAAR) 100-25 MG per tablet   Oral   Take 1 tablet by mouth daily.   90 tablet   3   . multivitamin (THERAGRAN) per tablet   Oral   Take 1 tablet by mouth daily. ( with B - Complex )          . Omega-3 Fatty Acids (FISH OIL PO)   Oral   Take 1 capsule by mouth daily. ( Mega Red )          . potassium chloride SA (K-DUR,KLOR-CON) 20 MEQ tablet   Oral   Take 1 tablet (20 mEq total) by mouth daily.   30 tablet   3   . EXPIRED: potassium chloride SA (KLOR-CON M20) 20 MEQ tablet   Oral   Take 1 tablet (20 mEq total) by mouth daily.   30 tablet   5   . warfarin (COUMADIN) 5 MG tablet      Take as directed by anticoagulation clinic   30 tablet   3    There were no vitals taken for this visit. Physical Exam  Constitutional: He is oriented to person, place, and time. He appears well-developed.  Elderly, in NAD, pleasant  HENT:  Head: Normocephalic.  Mouth/Throat: Oropharynx is clear and moist.  Eyes: EOM are normal. Pupils are equal, round, and reactive to light.  Neck: Normal range of motion. Neck supple.  Cardiovascular: Normal rate, regular rhythm, normal heart sounds and intact distal pulses.   Pulmonary/Chest: Effort normal and breath sounds normal.  Abdominal: Soft. Bowel sounds are normal. He exhibits no distension. There is no tenderness.  Musculoskeletal: Normal range of motion. He exhibits no edema.  Neurological: He is alert and oriented to person, place, and time. No cranial nerve deficit or sensory deficit. Coordination normal.  Very mild weakness in LUE and LLE compared to right; able to stand and shuffle two feet but reports feeling weak  Skin: Skin is warm and dry.    ED Course   Procedures (including critical care time)  Labs Reviewed - No data to display No results found. No diagnosis found.  Date: 09/27/2012 11:03  AM  Rate: 68  Rhythm: atrial fibrillation  QRS Axis: normal  Intervals: normal  ST/T Wave abnormalities: diffuse T wave flattening  Conduction Disutrbances: LAFB  Narrative Interpretation: LAFB, a fib, T wave flattening; no old for comparision    MDM  Pt with 2 days generalized weakness and intermittent chest pain.  Pt's wife reports he was leaning to right and walking his walker into the wall this AM.  On exam, pt is slightly weaker in his LUE and LLE than right.  Pt does become intermittently hypoxic when talking with him - his sats will drop to 90%.  He has no h/o tobacco use, CHF, COPD, asthma.  Does not wear O2 at home.  Denies feeling SOB or having cough.  Will check labs, urine, CXR, CT head to eval for CVA, ICH, electrolyte abnml, anemia, PNA, UTI as cause of sx.  Suspect possible TIA.  Pt reports he thinks he is feeling better.  Pt is not a TPA candidate given symptoms started yesterday (>3 hrs ago), on coumadin, prior ICH on TPA.    11:57 AM  Labs unremarkable.  Cr is 2.1 which is baseline.  Cr is therapeutic.  CXR.  BNP  elevated to 6000 but no other signs of volume overload omn exam, pt is chronically on lasix, given intermittent hypoxia, will hold IV dose since Cr is 2 and continue to monitor closely.  Pt's oxygen levels still fluctuating between 90-98% on RA.  Troponin negative.  Urine and head Ct pending.  Anticipate admission for possible TIA and hypoxia.  1:03 PM  No acute changes on head CT.  UA pending.  Will consult neuro-hospitalist for possible TIA.  L sided weakness almost completely resolved.  Family would like admission.  1:42 PM  Spoke with neuro hospitalist Kirkpatrick to see in ED.  Will admit to hospitalist.  PCP is T Brackbill.  UA shows no sign of infection.  2:15 PM  D/w Dr. Lendell Caprice with [facility] for admission to tele, inpatient for possible TIA and intermittent hypoxia which may be due to mild CHF exacerbation given elevated BNP but clear  CXR.  Layla Maw Ward, DO 09/27/12 1416

## 2012-09-27 NOTE — Consult Note (Signed)
NEURO HOSPITALIST CONSULT NOTE    Reason for Consult: LE weakness  HPI:                                                                                                                                          Craig [name] is an 79 y.o. male with history of stroke in the past, on coumadin for PAF with most recent INR 2.38.  Patietn and wife have noted over the past few days he has felt weaker in his legs and needing more assistance with his gait.  She has noted he is leaning to the right and pushing walker to the left. Wife states he tells her his knees are slightly stiff initially but after moving a little it gets better. In speaking with her, over the past year he has had more difficulty getting up from a seated position without assistance, he does tend to fall backward, he often tells his wife his shoes are stuck to the ground, she has noted increased shuffling gait and often tripping over doorways and having difficulty with steps.  When asked about his memory, she states over the past year he has had increasing difficulty with short term memory often asking her the same question repeatedly but his long term memory is intact. While speaking with the patient I noticed his face looked mask like but his wife feels this has been so for some time.   Past Medical History  Diagnosis Date  . Stroke     ischemic / right cerebellar/posterior inferior cerebellar artery / right pos infarct  . Hyperlipidemia   . Melanoma   . Hypokalemia   . Atrial fibrillation   . Hypertensive heart disease   . Chronic back pain   . History of epistaxis 07/19/2002  . Personal history of long-term (current) use of anticoagulants   . Prostate cancer, primary, with metastasis from prostate to other site     on Lupron injections per GU    Past Surgical History  Procedure Laterality Date  . Nasal sinus surgery  1998  . Tonsillectomy    . Skin graft    . Melanoma excision      last surgery for  melanoma was 2010    Family History  Problem Relation Age of Onset  . Heart failure Mother   . Heart attack Father      Social History:  reports that he quit smoking about 64 years ago. His smoking use included Cigarettes. He smoked 0.00 packs per day. He does not have any smokeless tobacco history on file. He reports that he does not drink alcohol or use illicit drugs.  Allergies  Allergen Reactions  . Pravachol     Muscle weakness  . Zocor (Simvastatin - High Dose)  Muscle weakness    MEDICATIONS:                                                                                                                     No current facility-administered medications for this encounter.   Current Outpatient Prescriptions  Medication Sig Dispense Refill  . acetaminophen (TYLENOL) 500 MG tablet Take 500 mg by mouth 2 (two) times daily. Takes one in the AM and one qhs      . amLODipine (NORVASC) 2.5 MG tablet Take 1 tablet (2.5 mg total) by mouth daily.  30 tablet  11  . aspirin 81 MG tablet Take 81 mg by mouth daily.        Marland Kitchen atorvastatin (LIPITOR) 10 MG tablet Take 1 tablet (10 mg total) by mouth daily.  30 tablet  11  . B Complex-C (B-COMPLEX WITH VITAMIN C) tablet Take 1 tablet by mouth daily.        . bicalutamide (CASODEX) 50 MG tablet Take 50 mg by mouth daily.      . bisoprolol-hydrochlorothiazide (ZIAC) 10-6.25 MG per tablet Take 1 tablet by mouth daily.  90 tablet  3  . calcium carbonate (OS-CAL) 600 MG TABS Take 600 mg by mouth 2 (two) times daily. Taking 1200 daily      . Denosumab (XGEVA [place]) Inject into the skin. monthly       . Docusate Calcium (STOOL SOFTENER PO) Take 1 tablet by mouth 2 (two) times daily.       Marland Kitchen ezetimibe (ZETIA) 10 MG tablet Take 1 tablet (10 mg total) by mouth daily.  90 tablet  3  . furosemide (LASIX) 20 MG tablet Take 1 tablet (20 mg total) by mouth daily.  30 tablet  11  . losartan-hydrochlorothiazide (HYZAAR) 100-25 MG per tablet Take 1 tablet by  mouth daily.  90 tablet  3  . multivitamin (THERAGRAN) per tablet Take 1 tablet by mouth daily. ( with B - Complex )      . Omega-3 Fatty Acids (FISH OIL PO) Take 1 capsule by mouth daily. ( Mega Red )      . potassium chloride SA (K-DUR,KLOR-CON) 20 MEQ tablet Take 1 tablet (20 mEq total) by mouth daily.  30 tablet  3  . warfarin (COUMADIN) 5 MG tablet Take 5 mg by mouth daily. Take 5mg   Monday and fridays and half a tablet every other day      . Leuprolide Acetate (LUPRON DEPOT IM) Inject into the muscle every 3 (three) months.           ROS:  History obtained from the patient and wife  General ROS: negative for - chills, fatigue, fever, night sweats, weight gain or weight loss Psychological ROS: positive for -  memory difficulties, Ophthalmic ROS: negative for - blurry vision, double vision, eye pain or loss of vision ENT ROS: negative for - epistaxis, nasal discharge, oral lesions, sore throat, tinnitus or vertigo Allergy and Immunology ROS: negative for - hives or itchy/watery eyes Hematological and Lymphatic ROS: negative for - bleeding problems, bruising or swollen lymph nodes Endocrine ROS: negative for - galactorrhea, hair pattern changes, polydipsia/polyuria or temperature intolerance Respiratory ROS: negative for - cough, hemoptysis, shortness of breath or wheezing Cardiovascular ROS: negative for - chest pain, dyspnea on exertion, edema or irregular heartbeat Gastrointestinal ROS: negative for - abdominal pain, diarrhea, hematemesis, nausea/vomiting or stool incontinence Genito-Urinary ROS: negative for - dysuria, hematuria, incontinence or urinary frequency/urgency Musculoskeletal ROS: negative for - joint swelling or muscular weakness Neurological ROS: as noted in HPI Dermatological ROS: negative for rash and skin lesion changes   Blood  pressure 153/80, pulse 57, temperature 98.1 F (36.7 C), temperature source Oral, resp. rate 12, SpO2 99.00%.   Neurologic Examination:                                                                                                      Mental Status: Alert, oriented, thought content appropriate.  Speech fluent without evidence of aphasia.  Able to follow 3 step commands without difficulty. Cranial Nerves: II: Discs flat bilaterally; Visual fields grossly normal, pupils equal, round, reactive to light and accommodation III,IV, VI: ptosis not present, extra-ocular motions intact bilaterally V,VII: smile symmetric, facial light touch sensation normal bilaterally VIII: hearing normal bilaterally IX,X: gag reflex present XI: bilateral shoulder shrug XII: midline tongue extension Motor: Right : Upper extremity   5/5    Left:     Upper extremity   5/5  Lower extremity   5/5     Lower extremity   4/5 Tone and bulk:normal tone throughout; no atrophy noted Sensory: Pinprick and light touch intact throughout, bilaterally but decreased in the feet bilaterally with decreased proprioception in his toes and absent vibration distally.  Deep Tendon Reflexes:  Right: Upper Extremity   Left: Upper extremity   biceps (C-5 to C-6) 2/4   biceps (C-5 to C-6) 2/4 tricep (C7) 2/4    triceps (C7) 2/4 Brachioradialis (C6) 2/4  Brachioradialis (C6) 2/4  Lower Extremity Lower Extremity  quadriceps (L-2 to L-4) 2/4   quadriceps (L-2 to L-4) 2/4 Achilles (S1) 0/4   Achilles (S1) 0/4  Plantars: Mute bilaterally Cerebellar: normal finger-to-nose,  normal heel-to-shin test GAIT: wide based, unsteady CV: pulses palpable throughout    Lab Results  Component Value Date/Time   CHOL 176 04/11/2012  9:51 AM    Results for orders placed during the hospital encounter of 09/27/12 (from the past 48 hour(s))  CBC WITH DIFFERENTIAL     Status: Abnormal   Collection Time    09/27/12 10:45 AM      Result Value Range    WBC 9.9  4.0 - 10.5 K/uL   RBC 4.06 (*) 4.22 - 5.81 MIL/uL   Hemoglobin 13.6  13.0 - 17.0 g/dL   HCT 16.1  09.6 - 04.5 %   MCV 96.1  78.0 - 100.0 fL   MCH 33.5  26.0 - 34.0 pg   MCHC 34.9  30.0 - 36.0 g/dL   RDW 40.9  81.1 - 91.4 %   Platelets 150  150 - 400 K/uL   Neutrophils Relative % 66  43 - 77 %   Neutro Abs 6.5  1.7 - 7.7 K/uL   Lymphocytes Relative 20  12 - 46 %   Lymphs Abs 2.0  0.7 - 4.0 K/uL   Monocytes Relative 13 (*) 3 - 12 %   Monocytes Absolute 1.3 (*) 0.1 - 1.0 K/uL   Eosinophils Relative 1  0 - 5 %   Eosinophils Absolute 0.1  0.0 - 0.7 K/uL   Basophils Relative 0  0 - 1 %   Basophils Absolute 0.0  0.0 - 0.1 K/uL  BASIC METABOLIC PANEL     Status: Abnormal   Collection Time    09/27/12 10:45 AM      Result Value Range   Sodium 140  135 - 145 mEq/L   Potassium 3.6  3.5 - 5.1 mEq/L   Chloride 98  96 - 112 mEq/L   CO2 32  19 - 32 mEq/L   Glucose, Bld 114 (*) 70 - 99 mg/dL   BUN 52 (*) 6 - 23 mg/dL   Creatinine, Ser 7.82 (*) 0.50 - 1.35 mg/dL   Calcium 95.6 (*) 8.4 - 10.5 mg/dL   GFR calc non Af Amer 27 (*) >90 mL/min   GFR calc Af Amer 31 (*) >90 mL/min   Comment:            The eGFR has been calculated     using the CKD EPI equation.     This calculation has not been     validated in all clinical     situations.     eGFR's persistently     <90 mL/min signify     possible Chronic Kidney Disease.  MAGNESIUM     Status: None   Collection Time    09/27/12 10:45 AM      Result Value Range   Magnesium 1.8  1.5 - 2.5 mg/dL  APTT     Status: Abnormal   Collection Time    09/27/12 10:45 AM      Result Value Range   aPTT 39 (*) 24 - 37 seconds   Comment:            IF BASELINE aPTT IS ELEVATED,     SUGGEST PATIENT RISK ASSESSMENT     BE USED TO DETERMINE APPROPRIATE     ANTICOAGULANT THERAPY.  PROTIME-INR     Status: Abnormal   Collection Time    09/27/12 10:45 AM      Result Value Range   Prothrombin Time 25.0 (*) 11.6 - 15.2 seconds   INR 2.36  (*) 0.00 - 1.49  PRO B NATRIURETIC PEPTIDE     Status: Abnormal   Collection Time    09/27/12 10:45 AM      Result Value Range   Pro B Natriuretic peptide (BNP) 6386.0 (*) 0 - 450 pg/mL  POCT I-STAT TROPONIN I     Status: None   Collection Time    09/27/12 11:05 AM      Result Value Range  Troponin i, poc 0.00  0.00 - 0.08 ng/mL   Comment 3            Comment: Due to the release kinetics of cTnI,     a negative result within the first hours     of the onset of symptoms does not rule out     myocardial infarction with certainty.     If myocardial infarction is still suspected,     repeat the test at appropriate intervals.  URINALYSIS, ROUTINE W REFLEX MICROSCOPIC     Status: Abnormal   Collection Time    09/27/12 12:47 PM      Result Value Range   Color, Urine YELLOW  YELLOW   APPearance CLEAR  CLEAR   Specific Gravity, Urine 1.018  1.005 - 1.030   pH 6.0  5.0 - 8.0   Glucose, UA NEGATIVE  NEGATIVE mg/dL   Hgb urine dipstick TRACE (*) NEGATIVE   Bilirubin Urine NEGATIVE  NEGATIVE   Ketones, ur NEGATIVE  NEGATIVE mg/dL   Protein, ur NEGATIVE  NEGATIVE mg/dL   Urobilinogen, UA 0.2  0.0 - 1.0 mg/dL   Nitrite NEGATIVE  NEGATIVE   Leukocytes, UA NEGATIVE  NEGATIVE  URINE MICROSCOPIC-ADD ON     Status: None   Collection Time    09/27/12 12:47 PM      Result Value Range   Squamous Epithelial / LPF RARE  RARE   RBC / HPF 0-2  <3 RBC/hpf    Dg Chest 2 View  09/27/2012   *RADIOLOGY REPORT*  Clinical Data: Intermittent chest pain, weakness  CHEST - 2 VIEW  Comparison: Portable chest x-ray of 04/29/2009  Findings: Moderate cardiomegaly is stable.  No active infiltrate or effusion is seen.  No acute bony abnormality is noted.  Lower thoracic vertebroplasty is present.  IMPRESSION:  Stable moderate cardiomegaly.  No active lung disease.   Original Report Authenticated By: Dwyane Dee, M.D.   Ct Head Wo Contrast  09/27/2012   *RADIOLOGY REPORT*  Clinical Data: Left-sided weakness.   History of prior cerebrovascular accident.  The patient is on Coumadin.  CT HEAD WITHOUT CONTRAST  Technique:  Contiguous axial images were obtained from the base of the skull through the vertex without contrast.  Comparison: No priors.  Findings: Extensive atrophy and low attenuation throughout the right cerebellar hemisphere, compatible with encephalomalacia from prior infarction.  Similar findings are present to a lesser degree in the posterior aspect of the left cerebellar hemisphere as well. A focal area of low attenuation in the left frontal cortex and subcortical white matter, compatible with encephalomalacia from old left frontal lobe infarction.  Patchy areas of mild decreased attenuation in the deep and periventricular white matter of the cerebral hemispheres bilaterally, most compatible with mild chronic microvascular ischemic disease. Mild cerebral atrophy.  No acute intracranial abnormalities.  Specifically, no evidence of acute intracranial hemorrhage, no definite findings of acute/subacute cerebral ischemia, no mass, mass effect, hydrocephalus or abnormal intra or extra-axial fluid collections.  Visualized paranasal sinuses and mastoids are well pneumatized.  No acute displaced skull fractures are identified.  IMPRESSION: 1.  No acute intracranial abnormalities.  Specifically, no evidence of acute intracranial hemorrhage. 2.  Mild chronic microvascular ischemic changes in the cerebral white matter with areas of encephalomalacia in the left frontal lobe and bilateral cerebellar hemispheres (right much greater than left), related to remote infarctions. 3.  Mild cerebral atrophy.   Original Report Authenticated By: Trudie Reed, M.D.    Assessment and  plan discussed with with attending physician and they are in agreement.    Felicie Morn PA-C [facility] [phone]  09/27/2012, 2:54 PM   Assessment/Plan: 79 year old male with progressive gait disturbance over the past 6-12  months. I suspect that this is most likely a multifactorial gait disturbance, however with signs of neuropathy and preserved reflexes at the knees, I would like to rule out myelopathy. Also an MRI of his brain to assess for stroke, or white matter disease burden would be helpful.    1) MRI brain, C-spine, T-spine 2) B12, SPEP, A1c 3) may need ncv/EMG as an outpatient given the preponderance of large fiber findings on his exam, (rule out CIDP though I feel this is unlikely given his preserved knee reflexes.) 4) continue Coumadin 5) if above is unremarkable, then physical therapy for multifactorial gait disturbance likely be indicated.   Ritta Slot, MD [facility] [phone]  If 7pm- 7am, please page neurology on call at [phone].

## 2012-09-28 ENCOUNTER — Observation Stay (HOSPITAL_COMMUNITY): Payer: Medicare Other

## 2012-09-28 DIAGNOSIS — I119 Hypertensive heart disease without heart failure: Secondary | ICD-10-CM

## 2012-09-28 DIAGNOSIS — I69993 Ataxia following unspecified cerebrovascular disease: Secondary | ICD-10-CM

## 2012-09-28 DIAGNOSIS — I359 Nonrheumatic aortic valve disorder, unspecified: Secondary | ICD-10-CM

## 2012-09-28 LAB — BASIC METABOLIC PANEL
Chloride: 101 mEq/L (ref 96–112)
GFR calc Af Amer: 39 mL/min — ABNORMAL LOW (ref >90)
Potassium: 3.5 mEq/L (ref 3.5–5.1)

## 2012-09-28 LAB — CALCIUM, IONIZED: Calcium, Ion: 1.51 mmol/L — ABNORMAL HIGH (ref 1.13–1.30)

## 2012-09-28 LAB — HEMOGLOBIN A1C
Hgb A1c MFr Bld: 5.6 % (ref <5.7)
Mean Plasma Glucose: 114 mg/dL (ref <117)

## 2012-09-28 LAB — GLUCOSE, CAPILLARY: Glucose-Capillary: 107 mg/dL — ABNORMAL HIGH (ref 70–99)

## 2012-09-28 MED ORDER — WARFARIN SODIUM 5 MG PO TABS
5.0000 mg | ORAL_TABLET | ORAL | Status: DC
  Filled 2012-09-28: qty 1

## 2012-09-28 MED ORDER — WARFARIN SODIUM 2.5 MG PO TABS
2.5000 mg | ORAL_TABLET | ORAL | Status: DC
  Administered 2012-09-28: 2.5 mg via ORAL
  Filled 2012-09-28: qty 1

## 2012-09-28 MED ORDER — WARFARIN - PHARMACIST DOSING INPATIENT
Freq: Every day | Status: DC
  Administered 2012-09-28: 18:00:00

## 2012-09-28 NOTE — Progress Notes (Signed)
[facility] PROGRESS NOTE  SLAYDEN MENNENGA ATF:[id number] DOB: May 11, 1924 DOA: 09/27/2012 PCP: [name], MD  Assessment/Plan: Weakness  - MRI of brain to r/o CVA, cspine and tspine ordered by neuro, pending - Question myelopathy per Neuro - recs for ? outpt EMG/nerve conduction study - B12, TSH normal,  - Pending folate.  - PT/OT eval.  - Continue coumadin. - ionized calcium mildly elevated. Stopped HCTZ  Atrial fibrillation, rate controlled on coumadin  Chronic kidney disease (CKD), stage IV (severe): at baseline  Elevated brain natriuretic peptide (BNP): no clinical evidence of CHF. Last echo in 2011 showed diastolic dysfunction and preserved EF. Will repeat echo and continue lasix  Hypercalcemia: see above  Hypercholesterolemia  Prostate cancer, metastatic  Code Status: Full Family Communication: Pt in room (indicate person spoken with, relationship, and if by phone, the number) Disposition Plan: Pending  Consultants:  Neurology  HPI/Subjective: No complaints. No acute events overnight.  Objective: Filed Vitals:   09/28/12 0200 09/28/12 0400 09/28/12 0600 09/28/12 0800  BP: 151/51 142/51 159/67 157/77  Pulse: 65 60 57 65  Temp:   98 F (36.7 C) 98.8 F (37.1 C)  TempSrc:      Resp: 18  18 16   Height:      Weight:  90.175 kg (198 lb 12.8 oz)    SpO2: 97% 98% 98% 98%    Intake/Output Summary (Last 24 hours) at 09/28/12 1135 Last data filed at 09/28/12 0800  Gross per 24 hour  Intake    360 ml  Output    850 ml  Net   -490 ml   Filed Weights   09/27/12 1648 09/28/12 0400  Weight: 92.987 kg (205 lb) 90.175 kg (198 lb 12.8 oz)    Exam:   General:  Awake, in nad  Cardiovascular: regular, s1, s2  Respiratory: normal resp effort, no wheezing  Abdomen: soft, nondistended  Musculoskeletal: perfused, no clubbing   Data Reviewed: Basic Metabolic Panel:  Recent Labs Lab 09/27/12 1045 09/28/12 0425  NA 140 141  K 3.6 3.5  CL 98 101   CO2 32 30  GLUCOSE 114* 95  BUN 52* 48*  CREATININE 2.13* 1.74*  CALCIUM 11.8* 10.7*  MG 1.8  --    Liver Function Tests:  Recent Labs Lab 09/27/12 1750  ALBUMIN 3.3*   No results found for this basename: LIPASE, AMYLASE,  in the last 168 hours No results found for this basename: AMMONIA,  in the last 168 hours CBC:  Recent Labs Lab 09/27/12 1045  WBC 9.9  NEUTROABS 6.5  HGB 13.6  HCT 39.0  MCV 96.1  PLT 150   Cardiac Enzymes: No results found for this basename: CKTOTAL, CKMB, CKMBINDEX, TROPONINI,  in the last 168 hours BNP (last 3 results)  Recent Labs  09/27/12 1045  PROBNP 6386.0*   CBG: No results found for this basename: GLUCAP,  in the last 168 hours  No results found for this or any previous visit (from the past 240 hour(s)).   Studies: Dg Chest 2 View  09/27/2012   *RADIOLOGY REPORT*  Clinical Data: Intermittent chest pain, weakness  CHEST - 2 VIEW  Comparison: Portable chest x-ray of 04/29/2009  Findings: Moderate cardiomegaly is stable.  No active infiltrate or effusion is seen.  No acute bony abnormality is noted.  Lower thoracic vertebroplasty is present.  IMPRESSION:  Stable moderate cardiomegaly.  No active lung disease.   Original Report Authenticated By: Dwyane Dee, M.D.   Ct Head Wo Contrast  09/27/2012   *RADIOLOGY REPORT*  Clinical Data: Left-sided weakness.  History of prior cerebrovascular accident.  The patient is on Coumadin.  CT HEAD WITHOUT CONTRAST  Technique:  Contiguous axial images were obtained from the base of the skull through the vertex without contrast.  Comparison: No priors.  Findings: Extensive atrophy and low attenuation throughout the right cerebellar hemisphere, compatible with encephalomalacia from prior infarction.  Similar findings are present to a lesser degree in the posterior aspect of the left cerebellar hemisphere as well. A focal area of low attenuation in the left frontal cortex and subcortical white matter, compatible  with encephalomalacia from old left frontal lobe infarction.  Patchy areas of mild decreased attenuation in the deep and periventricular white matter of the cerebral hemispheres bilaterally, most compatible with mild chronic microvascular ischemic disease. Mild cerebral atrophy.  No acute intracranial abnormalities.  Specifically, no evidence of acute intracranial hemorrhage, no definite findings of acute/subacute cerebral ischemia, no mass, mass effect, hydrocephalus or abnormal intra or extra-axial fluid collections.  Visualized paranasal sinuses and mastoids are well pneumatized.  No acute displaced skull fractures are identified.  IMPRESSION: 1.  No acute intracranial abnormalities.  Specifically, no evidence of acute intracranial hemorrhage. 2.  Mild chronic microvascular ischemic changes in the cerebral white matter with areas of encephalomalacia in the left frontal lobe and bilateral cerebellar hemispheres (right much greater than left), related to remote infarctions. 3.  Mild cerebral atrophy.   Original Report Authenticated By: Trudie Reed, M.D.    Scheduled Meds: . acetaminophen  500 mg Oral BID  . amLODipine  2.5 mg Oral Daily  . aspirin EC  81 mg Oral Daily  . atorvastatin  10 mg Oral Daily  . B-complex with vitamin C  1 tablet Oral Daily  . bicalutamide  50 mg Oral Daily  . bisoprolol  10 mg Oral Daily  . calcium carbonate  1 tablet Oral BID WC  . docusate sodium  100 mg Oral BID  . ezetimibe  10 mg Oral Daily  . furosemide  20 mg Oral Daily  . losartan  100 mg Oral Daily  . multivitamin with minerals  1 tablet Oral Daily  . potassium chloride SA  20 mEq Oral Daily  . sodium chloride  3 mL Intravenous Q12H  . warfarin  2.5 mg Oral Custom  . [START ON 09/29/2012] warfarin  5 mg Oral Custom  . Warfarin - Pharmacist Dosing Inpatient   Does not apply q1800   Continuous Infusions:   Principal Problem:   Weakness Active Problems:   Atrial fibrillation   Hypercholesterolemia    Prostate cancer   Chronic kidney disease (CKD), stage IV (severe)   Elevated brain natriuretic peptide (BNP) level   Hypercalcemia    Time spent:    Kenzie Flakes K  [facility] Pager [phone]. If 7PM-7AM, please contact night-coverage at [email or web address], password [facility] 09/28/2012, 11:35 AM  LOS: 1 day

## 2012-09-28 NOTE — Progress Notes (Signed)
ANTICOAGULATION CONSULT NOTE - Initial Consult  Pharmacy Consult for coumadin Indication: atrial fibrillation  Allergies  Allergen Reactions  . Pravachol     Muscle weakness  . Zocor (Simvastatin - High Dose)     Muscle weakness    Patient Measurements: Height: 6' (182.9 cm) Weight: 198 lb 12.8 oz (90.175 kg) IBW/kg (Calculated) : 77.6  Vital Signs: Temp: 98 F (36.7 C) (08/07 0600) Temp src: Oral (08/07 0000) BP: 159/67 mmHg (08/07 0600) Pulse Rate: 57 (08/07 0600)  Labs:  Recent Labs  09/27/12 1045 09/28/12 0425  HGB 13.6  --   HCT 39.0  --   PLT 150  --   APTT 39*  --   LABPROT 25.0* 25.4*  INR 2.36* 2.40*  CREATININE 2.13* 1.74*    Estimated Creatinine Clearance: 34.1 ml/min (by C-G formula based on Cr of 1.74).   Medical History: Past Medical History  Diagnosis Date  . Hyperlipidemia   . Hypokalemia   . Hypertensive heart disease   . Chronic back pain   . History of epistaxis 07/19/2002  . Personal history of long-term (current) use of anticoagulants   . ICH (intracerebral hemorrhage) ~ 1999    after TPA/notes 09/27/2012  . Hypertension   . Atrial fibrillation   . PAT (paroxysmal atrial tachycardia)   . Pneumonia     "once; several years ago" (09/27/2012)  . GERD (gastroesophageal reflux disease)   . H/O hiatal hernia   . Migraines     "migraines without headaches years ago" (09/27/2012)  . Stroke ~ 1999    ischemic / right cerebellar/posterior inferior cerebellar artery / right pos infarct  . Melanoma     "top of my head" (09/27/2012)  . Prostate cancer, primary, with metastasis from prostate to other site     on Lupron injections per GU  . Osteoarthritis of both feet     Medications:  Prescriptions prior to admission  Medication Sig Dispense Refill  . acetaminophen (TYLENOL) 500 MG tablet Take 500 mg by mouth 2 (two) times daily. Takes one in the AM and one qhs      . amLODipine (NORVASC) 2.5 MG tablet Take 1 tablet (2.5 mg total) by mouth  daily.  30 tablet  11  . aspirin 81 MG tablet Take 81 mg by mouth daily.        Marland Kitchen atorvastatin (LIPITOR) 10 MG tablet Take 1 tablet (10 mg total) by mouth daily.  30 tablet  11  . B Complex-C (B-COMPLEX WITH VITAMIN C) tablet Take 1 tablet by mouth daily.        . bicalutamide (CASODEX) 50 MG tablet Take 50 mg by mouth daily.      . bisoprolol-hydrochlorothiazide (ZIAC) 10-6.25 MG per tablet Take 1 tablet by mouth daily.  90 tablet  3  . calcium carbonate (OS-CAL) 600 MG TABS Take 600 mg by mouth 2 (two) times daily. Taking 1200 daily      . Denosumab (XGEVA [place]) Inject into the skin. monthly       . Docusate Calcium (STOOL SOFTENER PO) Take 1 tablet by mouth 2 (two) times daily.       Marland Kitchen ezetimibe (ZETIA) 10 MG tablet Take 1 tablet (10 mg total) by mouth daily.  90 tablet  3  . furosemide (LASIX) 20 MG tablet Take 1 tablet (20 mg total) by mouth daily.  30 tablet  11  . losartan-hydrochlorothiazide (HYZAAR) 100-25 MG per tablet Take 1 tablet by mouth daily.  90 tablet  3  . multivitamin (THERAGRAN) per tablet Take 1 tablet by mouth daily. ( with B - Complex )      . Omega-3 Fatty Acids (FISH OIL PO) Take 1 capsule by mouth daily. ( Mega Red )      . potassium chloride SA (K-DUR,KLOR-CON) 20 MEQ tablet Take 1 tablet (20 mEq total) by mouth daily.  30 tablet  3  . warfarin (COUMADIN) 5 MG tablet Take 5 mg by mouth daily. Take 5mg   Monday and fridays and half a tablet every other day      . Leuprolide Acetate (LUPRON DEPOT IM) Inject into the muscle every 3 (three) months.         Assessment: 79 yo man to continue coumadin for afib.  His home dose is 5 mg Mon and Fri and 2.5 mg other days.  Admission INR is 2.36, Hg 13.6. Goal of Therapy:  INR 2-3 Monitor platelets by anticoagulation protocol: Yes   Plan:  Resume home dose. Check daily PT/INR for now. Monitor for bleeding.  Craig [name] 09/28/2012,7:19 AM

## 2012-09-28 NOTE — Progress Notes (Signed)
Utilization review completed.  

## 2012-09-28 NOTE — Progress Notes (Signed)
Subjective: No changes overnight, no MRI was performed  Exam: Filed Vitals:   09/28/12 0600  BP: 159/67  Pulse: 57  Temp: 98 F (36.7 C)  Resp: 18   Gen: In bed, NAD MS: Awake, alert, reading newspaper CN: EOMI Motor: No pronator drift Sensory: Intact to light touch  B12 was normal  Impression: 79 year old male with progressive gait disturbance over the past 6-12 months. I suspect that this is most likely a multifactorial gait disturbance, however with signs of neuropathy and preserved reflexes at the knees, I would like to rule out myelopathy. Also an MRI of his brain to assess for stroke, or white matter disease burden would be helpful.   Recommendations: 1) MRI brain, C-spine, T-spine  2) SPEP, A1c  3) may need ncv/EMG as an outpatient given the preponderance of large fiber findings on his exam, (rule out CIDP though I feel this is unlikely given his preserved knee reflexes.)  4) continue Coumadin  5) if above is unremarkable, then physical therapy for multifactorial gait disturbance likely be indicated.   Ritta Slot, MD [facility] [phone]  If 7pm- 7am, please page neurology on call at [phone].

## 2012-09-28 NOTE — Evaluation (Signed)
Physical Therapy Evaluation Patient Details Name: Craig [name] MRN: [id number] DOB: [birth year] Today's Date: 09/28/2012 Time: 0925-1005 PT Time Calculation (min): 40 min  PT Assessment / Plan / Recommendation History of Present Illness  Patient is an 79 y/o male with h/o CVA 12 years ago with balance deficit walker dependent admitted with LE weakness and increased balance difficulty.  Clinical Impression  Patient demonstrates general imbalance which may be his recent baseline per wife and pt.  However, feel recent progressive decline indicated need for skilled PT to address deficits below and improve safety, decrease falls and allow maximal independence.  Pt and wife interested in returning to outpatient neurorehab for more balance training.    PT Assessment  Patient needs continued PT services    Follow Up Recommendations  Outpatient PT    Does the patient have the potential to tolerate intense rehabilitation    N/A  Barriers to Discharge  None      Equipment Recommendations  None recommended by PT    Recommendations for Other Services   None  Frequency Min 3X/week    Precautions / Restrictions Precautions Precautions: Fall Precaution Comments: h/o falls at home   Pertinent Vitals/Pain Min c/o pain at both great toes TMT joints (unrated) with ambulation, no pain at rest      Mobility  Bed Mobility Bed Mobility: Supine to Sit;Sitting - Scoot to Edge of Bed Supine to Sit: 3: Mod assist;With rails Sitting - Scoot to Edge of Bed: 4: Min guard Details for Bed Mobility Assistance: pulled up on rail and wife, and I assisted from behind for safety, one sitting scooted to edge of bed easily Transfers Transfers: Sit to Stand;Stand to Sit Sit to Stand: 5: Supervision;From bed;4: Min assist;From toilet Stand to Sit: 5: Supervision;4: Min guard;To toilet;To chair/3-in-1 Details for Transfer Assistance: assist for safety, increased assist from low toilet, though pt  appropriately using grabbar on right, pulled up on BSC in shower unsafely with left hand; also in room would leave walker and walk into bathroom and to chair Ambulation/Gait Ambulation/Gait Assistance: 5: Supervision Ambulation Distance (Feet): 400 Feet Assistive device: Rolling walker Ambulation/Gait Assistance Details: decreased clearance on left with increased edema in left LE, reports pain at both great toe TMT joints Gait Pattern: Wide base of support;Shuffle;Trunk flexed;Decreased dorsiflexion - left        PT Diagnosis: Abnormality of gait  PT Problem List: Decreased balance;Decreased mobility;Decreased knowledge of use of DME;Decreased safety awareness PT Treatment Interventions: DME instruction;Balance training;Gait training;Stair training;Functional mobility training;Patient/family education;Therapeutic activities;Therapeutic exercise     PT Goals(Current goals can be found in the care plan section) Acute Rehab PT Goals Patient Stated Goal: To get LE's stronger per wife PT Goal Formulation: With patient/family Time For Goal Achievement: 10/12/12 Potential to Achieve Goals: Good  Visit Information  Last PT Received On: 09/28/12 Assistance Needed: +1 History of Present Illness: Patient is an 79 y/o male with h/o CVA 12 years ago with balance deficit walker dependent admitted with LE weakness and increased balance difficulty.       Prior Functioning  Home Living Family/patient expects to be discharged to:: Private residence Living Arrangements: Spouse/significant other Available Help at Discharge: Family;Available 24 hours/day Type of Home: House Home Access: Level entry Home Layout: Two level Alternate Level Stairs-Number of Steps: 14 steps to car in garage,  Alternate Level Stairs-Rails: Right Home Equipment: Shower seat - built in;Walker - 2 wheels;Grab bars - tub/shower Additional Comments: seat in shower too little to use  Prior Function Level of Independence:  Needs assistance Gait / Transfers Assistance Needed: occasional assist from seated surfaces to stand; walking in house with walker independent ADL's / Homemaking Assistance Needed: assist with dressing lower body (compression socks), assist with shampooing, shoe tying Comments: has had multiple falls at home Communication Communication: No difficulties Dominant Hand: Right    Cognition  Cognition Arousal/Alertness: Awake/alert Behavior During Therapy: WFL for tasks assessed/performed Overall Cognitive Status: Within Functional Limits for tasks assessed    Extremity/Trunk Assessment Upper Extremity Assessment Upper Extremity Assessment: Overall WFL for tasks assessed Lower Extremity Assessment Lower Extremity Assessment: Overall WFL for tasks assessed Cervical / Trunk Assessment Cervical / Trunk Assessment: Kyphotic;Other exceptions Cervical / Trunk Exceptions: cervical flexion   Balance Balance Balance Assessed: Yes Dynamic Standing Balance Dynamic Standing - Balance Support: During functional activity;No upper extremity supported Dynamic Standing - Level of Assistance: 5: Stand by assistance Dynamic Standing - Comments: brushing teeth and washing hands at sink (at times leaning with hips on sink)  End of Session PT - End of Session Equipment Utilized During Treatment: Gait belt Activity Tolerance: Patient tolerated treatment well Patient left: in chair;with family/visitor present  GP     [facility] 09/28/2012, 11:51 AM Sheran Lawless, PT [phone] 09/28/2012

## 2012-09-28 NOTE — Progress Notes (Signed)
  Echocardiogram 2D Echocardiogram has been performed.  Craig [name] 09/28/2012, 6:08 PM

## 2012-09-29 LAB — GLUCOSE, CAPILLARY
Glucose-Capillary: 98 mg/dL (ref 70–99)
Glucose-Capillary: 111 mg/dL — ABNORMAL HIGH (ref 70–99)

## 2012-09-29 LAB — PROTIME-INR
INR: 2.11 — ABNORMAL HIGH (ref 0.00–1.49)
Prothrombin Time: 23 s — ABNORMAL HIGH (ref 11.6–15.2)

## 2012-09-29 NOTE — Discharge Summary (Signed)
Physician Discharge Summary  Craig [name] ZOX:[id number] DOB: [birth year] DOA: 09/27/2012  PCP: [name], MD  Admit date: 09/27/2012 Discharge date: 09/29/2012  Time spent: 30 minutes  Recommendations for Outpatient Follow-up:  1. Outpatient PT recommended 2. Consider an outpatient EMG or nerve conduction study if symptoms persist to rule out myelopathy, per Neuro recs  Discharge Diagnoses:  Principal Problem:   Weakness Active Problems:   Atrial fibrillation   Hypercholesterolemia   Prostate cancer   Chronic kidney disease (CKD), stage IV (severe)   Elevated brain natriuretic peptide (BNP) level   Hypercalcemia   Discharge Condition: Stable  Diet recommendation: Heart Healthy  Filed Weights   09/27/12 1648 09/28/12 0400 09/29/12 0400  Weight: 92.987 kg (205 lb) 90.175 kg (198 lb 12.8 oz) 89.858 kg (198 lb 1.6 oz)    History of present illness:  79 yr old M who presented with a HA and L side neck pain x2d, this morning while walking across gas station parking lot his R side became weak with numbness & tingling and he fell to the ground, he got up went in and paid for the gas and came out and walked across parking lot and R side became weak with numbness and tingling and pt fell again. Denies injury or syncope. A&Ox4, no droop or drift, speech clear, Has a Hx of HTN and DM,   He complains of chronic right hip weakness since hip replacement,  Suspected CVA vs TIA. Pt admitted to [facility]. The patient also complains of a lump on his left clavicle. This has been increasing in size.  Hospital Course:  The patient was admitted to the floor. Neurology was consulted. The patient underwent an MRI of the brain, cervical, and throracic spine. Redemonstration of spinal mets in the setting of prostate cancer were noted. Bone mets were previously noted on a bone scan from two months ago. No MRI evidence of CVA. Recommendations per Neurology were for a possible myelopathy work  up, which includes nerve conduction study and EMG. The patient otherwise remained medically stable for discharge.  Consultations:  Neurology  Discharge Exam: Filed Vitals:   09/29/12 0400 09/29/12 0827 09/29/12 1036 09/29/12 1135  BP: 167/67 156/86 147/49 161/58  Pulse: 54 62  54  Temp: 98.4 F (36.9 C) 97.4 F (36.3 C)  97.5 F (36.4 C)  TempSrc: Axillary Axillary  Oral  Resp:  18  18  Height:      Weight: 89.858 kg (198 lb 1.6 oz)     SpO2: 96% 98%  100%    General: Awake, in nad Cardiovascular: regular, s1, s2 Respiratory: normal resp effort, no wheezing  Discharge Instructions       Future Appointments Provider Department Dept Phone   10/09/2012 9:45 AM [facility] [phone]       Medication List         acetaminophen 500 MG tablet  Commonly known as:  TYLENOL  Take 500 mg by mouth 2 (two) times daily. Takes one in the AM and one qhs     amLODipine 2.5 MG tablet  Commonly known as:  NORVASC  Take 1 tablet (2.5 mg total) by mouth daily.     aspirin 81 MG tablet  Take 81 mg by mouth daily.     atorvastatin 10 MG tablet  Commonly known as:  LIPITOR  Take 1 tablet (10 mg total) by mouth daily.     B-complex with vitamin C tablet  Take  1 tablet by mouth daily.     bisoprolol-hydrochlorothiazide 10-6.25 MG per tablet  Commonly known as:  ZIAC  Take 1 tablet by mouth daily.     calcium carbonate 600 MG Tabs tablet  Commonly known as:  OS-CAL  Take 600 mg by mouth 2 (two) times daily. Taking 1200 daily     CASODEX 50 MG tablet  Generic drug:  bicalutamide  Take 50 mg by mouth daily.     ezetimibe 10 MG tablet  Commonly known as:  ZETIA  Take 1 tablet (10 mg total) by mouth daily.     FISH OIL PO  Take 1 capsule by mouth daily. ( Mega Red )     furosemide 20 MG tablet  Commonly known as:  LASIX  Take 1 tablet (20 mg total) by mouth daily.     losartan-hydrochlorothiazide 100-25 MG per tablet   Commonly known as:  HYZAAR  Take 1 tablet by mouth daily.     LUPRON DEPOT IM  Inject into the muscle every 3 (three) months.     multivitamin per tablet  Take 1 tablet by mouth daily. ( with B - Complex )     potassium chloride SA 20 MEQ tablet  Commonly known as:  K-DUR,KLOR-CON  Take 1 tablet (20 mEq total) by mouth daily.     STOOL SOFTENER PO  Take 1 tablet by mouth 2 (two) times daily.     warfarin 5 MG tablet  Commonly known as:  COUMADIN  Take 5 mg by mouth daily. Take 5mg   Monday and fridays and half a tablet every other day     XGEVA [place]  Inject into the skin. monthly       Allergies  Allergen Reactions  . Pravachol     Muscle weakness  . Zocor (Simvastatin - High Dose)     Muscle weakness   Follow-up Information   Follow up with [name], MD In 1 week.   Contact information:   [address] [place] [address] [phone]       Follow up with Follow up with your neurologist as scheduled.      Follow up with Follow up with your other specialists as scheduled.      The results of significant diagnostics from this hospitalization (including imaging, microbiology, ancillary and laboratory) are listed below for reference.    Significant Diagnostic Studies: Dg Chest 2 View  09/27/2012   *RADIOLOGY REPORT*  Clinical Data: Intermittent chest pain, weakness  CHEST - 2 VIEW  Comparison: Portable chest x-ray of 04/29/2009  Findings: Moderate cardiomegaly is stable.  No active infiltrate or effusion is seen.  No acute bony abnormality is noted.  Lower thoracic vertebroplasty is present.  IMPRESSION:  Stable moderate cardiomegaly.  No active lung disease.   Original Report Authenticated By: Dwyane Dee, M.D.   Ct Head Wo Contrast  09/27/2012   *RADIOLOGY REPORT*  Clinical Data: Left-sided weakness.  History of prior cerebrovascular accident.  The patient is on Coumadin.  CT HEAD WITHOUT CONTRAST  Technique:  Contiguous axial images were  obtained from the base of the skull through the vertex without contrast.  Comparison: No priors.  Findings: Extensive atrophy and low attenuation throughout the right cerebellar hemisphere, compatible with encephalomalacia from prior infarction.  Similar findings are present to a lesser degree in the posterior aspect of the left cerebellar hemisphere as well. A focal area of low attenuation in the left frontal cortex and subcortical  white matter, compatible with encephalomalacia from old left frontal lobe infarction.  Patchy areas of mild decreased attenuation in the deep and periventricular white matter of the cerebral hemispheres bilaterally, most compatible with mild chronic microvascular ischemic disease. Mild cerebral atrophy.  No acute intracranial abnormalities.  Specifically, no evidence of acute intracranial hemorrhage, no definite findings of acute/subacute cerebral ischemia, no mass, mass effect, hydrocephalus or abnormal intra or extra-axial fluid collections.  Visualized paranasal sinuses and mastoids are well pneumatized.  No acute displaced skull fractures are identified.  IMPRESSION: 1.  No acute intracranial abnormalities.  Specifically, no evidence of acute intracranial hemorrhage. 2.  Mild chronic microvascular ischemic changes in the cerebral white matter with areas of encephalomalacia in the left frontal lobe and bilateral cerebellar hemispheres (right much greater than left), related to remote infarctions. 3.  Mild cerebral atrophy.   Original Report Authenticated By: Trudie Reed, M.D.   Mr Brain Wo Contrast  09/28/2012   *RADIOLOGY REPORT*  Clinical Data:  Gait difficulty.  Generalized weakness.  Pulling to the left when walking.  MRI HEAD WITHOUT CONTRAST MRI CERVICAL SPINE WITHOUT CONTRAST  Technique:  Multiplanar, multiecho pulse sequences of the brain and surrounding structures, and cervical spine, to include the craniocervical junction and cervicothoracic junction, were obtained  without intravenous contrast.  Comparison:  CT head without contrast 09/27/2012.  MRI HEAD  Findings:  The diffusion weighted images demonstrate no evidence for acute or subacute infarction.  Extensive cerebellar infarcts are present bilaterally with evidence for remote hemorrhage in the inferior right cerebellum.  The right vertebral artery is occluded. Flow is present in the basilar artery and left vertebral artery. Flow is present in the anterior circulation.  Moderate generalized atrophy is present.  A remote right posterior frontal lobe cortical infarct is present.  Periventricular white matter changes are evident bilaterally.  Wallerian degeneration is evident within the right cerebral peduncle.  No acute hemorrhage or mass lesion is present.  The patient is status post bilateral lens extractions.  The globes and orbits are otherwise intact.  The patient is status post bilateral nasal antrostomies and ethmoidectomies.  Mild mucosal thickening is noted along the floor of the maxillary sinuses bilaterally.  IMPRESSION:  1.  No acute intracranial abnormality. 2.  Remote bilateral cerebellar infarcts, right greater than left. 3.  Remote hemorrhage along the inferior aspect of the right cerebellum. 4.  Occluded right vertebral artery. 5.  Remote focal cortical infarct in the posterior right frontal lobe. 5.  Moderate wallerian degeneration of the right cerebral peduncle. 6.  Moderate generalized atrophy and white matter disease bilaterally. 7.  Postoperative changes of sinuses with minimal residual mucosal thickening in the inferior maxillary sinuses bilaterally.  MRI CERVICAL SPINE  Findings: Normal signal is present in the cervical and upper thoracic spinal cord to the lowest imaged level, T2.  Marrow signal, vertebral body heights, alignment are normal.  There is fusion across the end plates at F6-2 and C4-5.  The craniocervical junction is within normal limits.  The right vertebral artery is occluded.  Flow is  present in the remaining vascular structures of the neck.  C2-3:  Asymmetric right-sided uncovertebral facet hypertrophy is present without significant stenosis.  C3-4:  Mild uncovertebral and facet disease is present bilaterally. Mild right foraminal narrowing is present.  C4-5:  Asymmetric left-sided uncovertebral and facet hypertrophy is present.  Mild left foraminal narrowing is evident.  C5-6:  A broad-based disc osteophyte complex present. Uncovertebral and facet hypertrophy is present bilaterally.  Mild moderate foraminal narrowing is present bilaterally.  There is partial effacement of the ventral CSF.  C6-7:  A broad-based disc osteophyte complex is present. Uncovertebral spurring is present bilaterally.  Moderate right and mild left foraminal stenosis is present.  C7-T1:  Negative.  IMPRESSION:  1.  Multilevel foraminal narrowing as described. 2.  No significant central canal stenosis or cord signal abnormality. 3.  Occluded right vertebral artery.  *RADIOLOGY REPORT*  MRI THORACIC SPINE WITHOUT CONTRAST  Technique:  Multiplanar and multiecho pulse sequences of the thoracic spine were obtained without intravenous contrast.  Findings:  Normal signal is present in the thoracic spine.  The conus medullaris terminates below L1, within normal limits.  Patient is status post vertebral augmentation at T11.  A sclerotic lesion is seen anteriorly at T9, measuring 13 x 21 mm on the sagittal images.  There are remote to compression fractures involving the superior endplate of T2 and more prominently the inferior plate of T6.  No significant retropulsion of bone is present.  Exaggerated thoracic kyphosis is present.  Heterogeneous marrow signal in the ribs bilaterally is concerning for scattered metastatic disease.  No significant disc protrusions or stenoses are present.  IMPRESSION:  1.  Anterior T1 and T2 hypointense lesion at T9 is compatible with a focal metastasis. 2.  Remote compression fractures at T2 and T6  with exaggerated thoracic kyphosis. 3.  Status post vertebral augmentation at T11. 4.  Heterogeneity of marrow signal in the ribs suggests metastatic disease.  This corresponds with the recent bone scan. 5.  No significant cord compression or central canal stenosis.   Original Report Authenticated By: Marin Roberts, M.D.   Mr Cervical Spine Wo Contrast  09/28/2012   *RADIOLOGY REPORT*  Clinical Data:  Gait difficulty.  Generalized weakness.  Pulling to the left when walking.  MRI HEAD WITHOUT CONTRAST MRI CERVICAL SPINE WITHOUT CONTRAST  Technique:  Multiplanar, multiecho pulse sequences of the brain and surrounding structures, and cervical spine, to include the craniocervical junction and cervicothoracic junction, were obtained without intravenous contrast.  Comparison:  CT head without contrast 09/27/2012.  MRI HEAD  Findings:  The diffusion weighted images demonstrate no evidence for acute or subacute infarction.  Extensive cerebellar infarcts are present bilaterally with evidence for remote hemorrhage in the inferior right cerebellum.  The right vertebral artery is occluded. Flow is present in the basilar artery and left vertebral artery. Flow is present in the anterior circulation.  Moderate generalized atrophy is present.  A remote right posterior frontal lobe cortical infarct is present.  Periventricular white matter changes are evident bilaterally.  Wallerian degeneration is evident within the right cerebral peduncle.  No acute hemorrhage or mass lesion is present.  The patient is status post bilateral lens extractions.  The globes and orbits are otherwise intact.  The patient is status post bilateral nasal antrostomies and ethmoidectomies.  Mild mucosal thickening is noted along the floor of the maxillary sinuses bilaterally.  IMPRESSION:  1.  No acute intracranial abnormality. 2.  Remote bilateral cerebellar infarcts, right greater than left. 3.  Remote hemorrhage along the inferior aspect of the  right cerebellum. 4.  Occluded right vertebral artery. 5.  Remote focal cortical infarct in the posterior right frontal lobe. 5.  Moderate wallerian degeneration of the right cerebral peduncle. 6.  Moderate generalized atrophy and white matter disease bilaterally. 7.  Postoperative changes of sinuses with minimal residual mucosal thickening in the inferior maxillary sinuses bilaterally.  MRI CERVICAL SPINE  Findings: Normal  signal is present in the cervical and upper thoracic spinal cord to the lowest imaged level, T2.  Marrow signal, vertebral body heights, alignment are normal.  There is fusion across the end plates at Z6-1 and C4-5.  The craniocervical junction is within normal limits.  The right vertebral artery is occluded.  Flow is present in the remaining vascular structures of the neck.  C2-3:  Asymmetric right-sided uncovertebral facet hypertrophy is present without significant stenosis.  C3-4:  Mild uncovertebral and facet disease is present bilaterally. Mild right foraminal narrowing is present.  C4-5:  Asymmetric left-sided uncovertebral and facet hypertrophy is present.  Mild left foraminal narrowing is evident.  C5-6:  A broad-based disc osteophyte complex present. Uncovertebral and facet hypertrophy is present bilaterally.  Mild moderate foraminal narrowing is present bilaterally.  There is partial effacement of the ventral CSF.  C6-7:  A broad-based disc osteophyte complex is present. Uncovertebral spurring is present bilaterally.  Moderate right and mild left foraminal stenosis is present.  C7-T1:  Negative.  IMPRESSION:  1.  Multilevel foraminal narrowing as described. 2.  No significant central canal stenosis or cord signal abnormality. 3.  Occluded right vertebral artery.  *RADIOLOGY REPORT*  MRI THORACIC SPINE WITHOUT CONTRAST  Technique:  Multiplanar and multiecho pulse sequences of the thoracic spine were obtained without intravenous contrast.  Findings:  Normal signal is present in the  thoracic spine.  The conus medullaris terminates below L1, within normal limits.  Patient is status post vertebral augmentation at T11.  A sclerotic lesion is seen anteriorly at T9, measuring 13 x 21 mm on the sagittal images.  There are remote to compression fractures involving the superior endplate of T2 and more prominently the inferior plate of T6.  No significant retropulsion of bone is present.  Exaggerated thoracic kyphosis is present.  Heterogeneous marrow signal in the ribs bilaterally is concerning for scattered metastatic disease.  No significant disc protrusions or stenoses are present.  IMPRESSION:  1.  Anterior T1 and T2 hypointense lesion at T9 is compatible with a focal metastasis. 2.  Remote compression fractures at T2 and T6 with exaggerated thoracic kyphosis. 3.  Status post vertebral augmentation at T11. 4.  Heterogeneity of marrow signal in the ribs suggests metastatic disease.  This corresponds with the recent bone scan. 5.  No significant cord compression or central canal stenosis.   Original Report Authenticated By: Marin Roberts, M.D.   Mr Thoracic Spine Wo Contrast  09/28/2012   *RADIOLOGY REPORT*  Clinical Data:  Gait difficulty.  Generalized weakness.  Pulling to the left when walking.  MRI HEAD WITHOUT CONTRAST MRI CERVICAL SPINE WITHOUT CONTRAST  Technique:  Multiplanar, multiecho pulse sequences of the brain and surrounding structures, and cervical spine, to include the craniocervical junction and cervicothoracic junction, were obtained without intravenous contrast.  Comparison:  CT head without contrast 09/27/2012.  MRI HEAD  Findings:  The diffusion weighted images demonstrate no evidence for acute or subacute infarction.  Extensive cerebellar infarcts are present bilaterally with evidence for remote hemorrhage in the inferior right cerebellum.  The right vertebral artery is occluded. Flow is present in the basilar artery and left vertebral artery. Flow is present in the  anterior circulation.  Moderate generalized atrophy is present.  A remote right posterior frontal lobe cortical infarct is present.  Periventricular white matter changes are evident bilaterally.  Wallerian degeneration is evident within the right cerebral peduncle.  No acute hemorrhage or mass lesion is present.  The patient is status  post bilateral lens extractions.  The globes and orbits are otherwise intact.  The patient is status post bilateral nasal antrostomies and ethmoidectomies.  Mild mucosal thickening is noted along the floor of the maxillary sinuses bilaterally.  IMPRESSION:  1.  No acute intracranial abnormality. 2.  Remote bilateral cerebellar infarcts, right greater than left. 3.  Remote hemorrhage along the inferior aspect of the right cerebellum. 4.  Occluded right vertebral artery. 5.  Remote focal cortical infarct in the posterior right frontal lobe. 5.  Moderate wallerian degeneration of the right cerebral peduncle. 6.  Moderate generalized atrophy and white matter disease bilaterally. 7.  Postoperative changes of sinuses with minimal residual mucosal thickening in the inferior maxillary sinuses bilaterally.  MRI CERVICAL SPINE  Findings: Normal signal is present in the cervical and upper thoracic spinal cord to the lowest imaged level, T2.  Marrow signal, vertebral body heights, alignment are normal.  There is fusion across the end plates at W4-1 and C4-5.  The craniocervical junction is within normal limits.  The right vertebral artery is occluded.  Flow is present in the remaining vascular structures of the neck.  C2-3:  Asymmetric right-sided uncovertebral facet hypertrophy is present without significant stenosis.  C3-4:  Mild uncovertebral and facet disease is present bilaterally. Mild right foraminal narrowing is present.  C4-5:  Asymmetric left-sided uncovertebral and facet hypertrophy is present.  Mild left foraminal narrowing is evident.  C5-6:  A broad-based disc osteophyte complex  present. Uncovertebral and facet hypertrophy is present bilaterally.  Mild moderate foraminal narrowing is present bilaterally.  There is partial effacement of the ventral CSF.  C6-7:  A broad-based disc osteophyte complex is present. Uncovertebral spurring is present bilaterally.  Moderate right and mild left foraminal stenosis is present.  C7-T1:  Negative.  IMPRESSION:  1.  Multilevel foraminal narrowing as described. 2.  No significant central canal stenosis or cord signal abnormality. 3.  Occluded right vertebral artery.  *RADIOLOGY REPORT*  MRI THORACIC SPINE WITHOUT CONTRAST  Technique:  Multiplanar and multiecho pulse sequences of the thoracic spine were obtained without intravenous contrast.  Findings:  Normal signal is present in the thoracic spine.  The conus medullaris terminates below L1, within normal limits.  Patient is status post vertebral augmentation at T11.  A sclerotic lesion is seen anteriorly at T9, measuring 13 x 21 mm on the sagittal images.  There are remote to compression fractures involving the superior endplate of T2 and more prominently the inferior plate of T6.  No significant retropulsion of bone is present.  Exaggerated thoracic kyphosis is present.  Heterogeneous marrow signal in the ribs bilaterally is concerning for scattered metastatic disease.  No significant disc protrusions or stenoses are present.  IMPRESSION:  1.  Anterior T1 and T2 hypointense lesion at T9 is compatible with a focal metastasis. 2.  Remote compression fractures at T2 and T6 with exaggerated thoracic kyphosis. 3.  Status post vertebral augmentation at T11. 4.  Heterogeneity of marrow signal in the ribs suggests metastatic disease.  This corresponds with the recent bone scan. 5.  No significant cord compression or central canal stenosis.   Original Report Authenticated By: Marin Roberts, M.D.    Microbiology: No results found for this or any previous visit (from the past 240 hour(s)).    Labs: Basic Metabolic Panel:  Recent Labs Lab 09/27/12 1045 09/28/12 0425  NA 140 141  K 3.6 3.5  CL 98 101  CO2 32 30  GLUCOSE 114* 95  BUN 52*  48*  CREATININE 2.13* 1.74*  CALCIUM 11.8* 10.7*  MG 1.8  --    Liver Function Tests:  Recent Labs Lab 09/27/12 1750  ALBUMIN 3.3*   No results found for this basename: LIPASE, AMYLASE,  in the last 168 hours No results found for this basename: AMMONIA,  in the last 168 hours CBC:  Recent Labs Lab 09/27/12 1045  WBC 9.9  NEUTROABS 6.5  HGB 13.6  HCT 39.0  MCV 96.1  PLT 150   Cardiac Enzymes: No results found for this basename: CKTOTAL, CKMB, CKMBINDEX, TROPONINI,  in the last 168 hours BNP: BNP (last 3 results)  Recent Labs  09/27/12 1045  PROBNP 6386.0*   CBG:  Recent Labs Lab 09/28/12 2105 09/29/12 0828  GLUCAP 107* 98    Signed:  Shannette Tabares K  [facility] 09/29/2012, 12:21 PM

## 2012-09-29 NOTE — Progress Notes (Signed)
ANTICOAGULATION CONSULT NOTE - Follow Up Consult  Pharmacy Consult for Warfarin Indication: atrial fibrillation  Allergies  Allergen Reactions  . Pravachol     Muscle weakness  . Zocor (Simvastatin - High Dose)     Muscle weakness    Patient Measurements: Height: 6' (182.9 cm) Weight: 198 lb 1.6 oz (89.858 kg) IBW/kg (Calculated) : 77.6  Vital Signs: Temp: 98.4 F (36.9 C) (08/08 0400) Temp src: Axillary (08/08 0400) BP: 167/67 mmHg (08/08 0400) Pulse Rate: 54 (08/08 0400)  Labs:  Recent Labs  09/27/12 1045 09/28/12 0425 09/29/12 0701  HGB 13.6  --   --   HCT 39.0  --   --   PLT 150  --   --   APTT 39*  --   --   LABPROT 25.0* 25.4* 23.0*  INR 2.36* 2.40* 2.11*  CREATININE 2.13* 1.74*  --     Estimated Creatinine Clearance: 34.1 ml/min (by C-G formula based on Cr of 1.74).   Medications:  Warfarin PTA Dose: 5mg  M/F, 2.5mg  all other days  Assessment: 79 y/o M here with progressive weakness/gait disturbance. On chronic warfarin therapy for A-fib. INR is 2.11<2.40. CBC good. Scr 1.74<2.13 with CrCl ~ 30-40. No overt bleeding noted.   Goal of Therapy:  INR 2-3 Monitor platelets by anticoagulation protocol: Yes   Plan:  -Continue warfarin at home dose as above -Daily PT/INR -Monitor for bleeding  Thank you for allowing me to take part in this patient's care,  Abran Duke, PharmD Clinical Pharmacist Phone: [phone] Pager: [phone] 09/29/2012 8:32 AM

## 2012-09-29 NOTE — Progress Notes (Signed)
NEURO HOSPITALIST PROGRESS NOTE   SUBJECTIVE:                                                                                                                        No complaints.  OBJECTIVE:                                                                                                                           Vital signs in last 24 hours: Temp:  [97.4 F (36.3 C)-98.8 F (37.1 C)] 97.5 F (36.4 C) (08/08 1135) Pulse Rate:  [51-70] 54 (08/08 1135) Resp:  [17-18] 18 (08/08 1135) BP: (140-167)/(49-86) 161/58 mmHg (08/08 1135) SpO2:  [95 %-100 %] 100 % (08/08 1135) Weight:  [89.858 kg (198 lb 1.6 oz)] 89.858 kg (198 lb 1.6 oz) (08/08 0400)  Intake/Output from previous day: 08/07 0701 - 08/08 0700 In: 360 [P.O.:360] Out: 500 [Urine:500] Intake/Output this shift: Total I/O In: 363 [P.O.:360; I.V.:3] Out: -  Nutritional status: Cardiac  Past Medical History  Diagnosis Date  . Hyperlipidemia   . Hypokalemia   . Hypertensive heart disease   . Chronic back pain   . History of epistaxis 07/19/2002  . Personal history of long-term (current) use of anticoagulants   . ICH (intracerebral hemorrhage) ~ 1999    after TPA/notes 09/27/2012  . Hypertension   . Atrial fibrillation   . PAT (paroxysmal atrial tachycardia)   . Pneumonia     "once; several years ago" (09/27/2012)  . GERD (gastroesophageal reflux disease)   . H/O hiatal hernia   . Migraines     "migraines without headaches years ago" (09/27/2012)  . Stroke ~ 1999    ischemic / right cerebellar/posterior inferior cerebellar artery / right pos infarct  . Melanoma     "top of my head" (09/27/2012)  . Prostate cancer, primary, with metastasis from prostate to other site     on Lupron injections per GU  . Osteoarthritis of both feet      Neurologic Exam:   Mental Status:  Alert, oriented, thought content appropriate. Speech fluent without evidence of aphasia. Able to follow 3 step  commands without difficulty.  Cranial Nerves:  II: Discs flat bilaterally; Visual fields grossly normal, pupils equal,  round, reactive to light and accommodation  III,IV, VI: ptosis not present, extra-ocular motions intact bilaterally  V,VII: smile symmetric, facial light touch sensation normal bilaterally  VIII: hearing normal bilaterally  IX,X: gag reflex present  XI: bilateral shoulder shrug  XII: midline tongue extension  Motor:  Right :  Upper extremity 5/5   Left:  Upper extremity 5/5   Lower extremity 5/5    Lower extremity 4/5  Tone and bulk:normal tone throughout; no atrophy noted  Sensory: Pinprick and light touch intact throughout, bilaterally but decreased in the feet bilaterally with decreased proprioception in his toes and absent vibration distally.  Deep Tendon Reflexes:  2+ throughout with no AJ Plantars:  Mute bilaterally  Cerebellar:  normal finger-to-nose, normal heel-to-shin test  GAIT: wide based, unsteady  CV: pulses palpable throughout     Lab Results: Lab Results  Component Value Date/Time   CHOL 176 04/11/2012  9:51 AM   Lipid Panel No results found for this basename: CHOL, TRIG, HDL, CHOLHDL, VLDL, LDLCALC,  in the last 72 hours  Studies/Results: Mr Brain Wo Contrast  09/28/2012   *RADIOLOGY REPORT*  Clinical Data:  Gait difficulty.  Generalized weakness.  Pulling to the left when walking.  MRI HEAD WITHOUT CONTRAST MRI CERVICAL SPINE WITHOUT CONTRAST  Technique:  Multiplanar, multiecho pulse sequences of the brain and surrounding structures, and cervical spine, to include the craniocervical junction and cervicothoracic junction, were obtained without intravenous contrast.  Comparison:  CT head without contrast 09/27/2012.  MRI HEAD  Findings:  The diffusion weighted images demonstrate no evidence for acute or subacute infarction.  Extensive cerebellar infarcts are present bilaterally with evidence for remote hemorrhage in the inferior right cerebellum.  The  right vertebral artery is occluded. Flow is present in the basilar artery and left vertebral artery. Flow is present in the anterior circulation.  Moderate generalized atrophy is present.  A remote right posterior frontal lobe cortical infarct is present.  Periventricular white matter changes are evident bilaterally.  Wallerian degeneration is evident within the right cerebral peduncle.  No acute hemorrhage or mass lesion is present.  The patient is status post bilateral lens extractions.  The globes and orbits are otherwise intact.  The patient is status post bilateral nasal antrostomies and ethmoidectomies.  Mild mucosal thickening is noted along the floor of the maxillary sinuses bilaterally.  IMPRESSION:  1.  No acute intracranial abnormality. 2.  Remote bilateral cerebellar infarcts, right greater than left. 3.  Remote hemorrhage along the inferior aspect of the right cerebellum. 4.  Occluded right vertebral artery. 5.  Remote focal cortical infarct in the posterior right frontal lobe. 5.  Moderate wallerian degeneration of the right cerebral peduncle. 6.  Moderate generalized atrophy and white matter disease bilaterally. 7.  Postoperative changes of sinuses with minimal residual mucosal thickening in the inferior maxillary sinuses bilaterally.  MRI CERVICAL SPINE  Findings: Normal signal is present in the cervical and upper thoracic spinal cord to the lowest imaged level, T2.  Marrow signal, vertebral body heights, alignment are normal.  There is fusion across the end plates at J4-7 and C4-5.  The craniocervical junction is within normal limits.  The right vertebral artery is occluded.  Flow is present in the remaining vascular structures of the neck.  C2-3:  Asymmetric right-sided uncovertebral facet hypertrophy is present without significant stenosis.  C3-4:  Mild uncovertebral and facet disease is present bilaterally. Mild right foraminal narrowing is present.  C4-5:  Asymmetric left-sided uncovertebral and  facet  hypertrophy is present.  Mild left foraminal narrowing is evident.  C5-6:  A broad-based disc osteophyte complex present. Uncovertebral and facet hypertrophy is present bilaterally.  Mild moderate foraminal narrowing is present bilaterally.  There is partial effacement of the ventral CSF.  C6-7:  A broad-based disc osteophyte complex is present. Uncovertebral spurring is present bilaterally.  Moderate right and mild left foraminal stenosis is present.  C7-T1:  Negative.  IMPRESSION:  1.  Multilevel foraminal narrowing as described. 2.  No significant central canal stenosis or cord signal abnormality. 3.  Occluded right vertebral artery.  *RADIOLOGY REPORT*  MRI THORACIC SPINE WITHOUT CONTRAST  Technique:  Multiplanar and multiecho pulse sequences of the thoracic spine were obtained without intravenous contrast.  Findings:  Normal signal is present in the thoracic spine.  The conus medullaris terminates below L1, within normal limits.  Patient is status post vertebral augmentation at T11.  A sclerotic lesion is seen anteriorly at T9, measuring 13 x 21 mm on the sagittal images.  There are remote to compression fractures involving the superior endplate of T2 and more prominently the inferior plate of T6.  No significant retropulsion of bone is present.  Exaggerated thoracic kyphosis is present.  Heterogeneous marrow signal in the ribs bilaterally is concerning for scattered metastatic disease.  No significant disc protrusions or stenoses are present.  IMPRESSION:  1.  Anterior T1 and T2 hypointense lesion at T9 is compatible with a focal metastasis. 2.  Remote compression fractures at T2 and T6 with exaggerated thoracic kyphosis. 3.  Status post vertebral augmentation at T11. 4.  Heterogeneity of marrow signal in the ribs suggests metastatic disease.  This corresponds with the recent bone scan. 5.  No significant cord compression or central canal stenosis.   Original Report Authenticated By: Marin Roberts,  M.D.   Mr Cervical Spine Wo Contrast  09/28/2012   *RADIOLOGY REPORT*  Clinical Data:  Gait difficulty.  Generalized weakness.  Pulling to the left when walking.  MRI HEAD WITHOUT CONTRAST MRI CERVICAL SPINE WITHOUT CONTRAST  Technique:  Multiplanar, multiecho pulse sequences of the brain and surrounding structures, and cervical spine, to include the craniocervical junction and cervicothoracic junction, were obtained without intravenous contrast.  Comparison:  CT head without contrast 09/27/2012.  MRI HEAD  Findings:  The diffusion weighted images demonstrate no evidence for acute or subacute infarction.  Extensive cerebellar infarcts are present bilaterally with evidence for remote hemorrhage in the inferior right cerebellum.  The right vertebral artery is occluded. Flow is present in the basilar artery and left vertebral artery. Flow is present in the anterior circulation.  Moderate generalized atrophy is present.  A remote right posterior frontal lobe cortical infarct is present.  Periventricular white matter changes are evident bilaterally.  Wallerian degeneration is evident within the right cerebral peduncle.  No acute hemorrhage or mass lesion is present.  The patient is status post bilateral lens extractions.  The globes and orbits are otherwise intact.  The patient is status post bilateral nasal antrostomies and ethmoidectomies.  Mild mucosal thickening is noted along the floor of the maxillary sinuses bilaterally.  IMPRESSION:  1.  No acute intracranial abnormality. 2.  Remote bilateral cerebellar infarcts, right greater than left. 3.  Remote hemorrhage along the inferior aspect of the right cerebellum. 4.  Occluded right vertebral artery. 5.  Remote focal cortical infarct in the posterior right frontal lobe. 5.  Moderate wallerian degeneration of the right cerebral peduncle. 6.  Moderate generalized atrophy and white matter  disease bilaterally. 7.  Postoperative changes of sinuses with minimal residual  mucosal thickening in the inferior maxillary sinuses bilaterally.  MRI CERVICAL SPINE  Findings: Normal signal is present in the cervical and upper thoracic spinal cord to the lowest imaged level, T2.  Marrow signal, vertebral body heights, alignment are normal.  There is fusion across the end plates at Z6-1 and C4-5.  The craniocervical junction is within normal limits.  The right vertebral artery is occluded.  Flow is present in the remaining vascular structures of the neck.  C2-3:  Asymmetric right-sided uncovertebral facet hypertrophy is present without significant stenosis.  C3-4:  Mild uncovertebral and facet disease is present bilaterally. Mild right foraminal narrowing is present.  C4-5:  Asymmetric left-sided uncovertebral and facet hypertrophy is present.  Mild left foraminal narrowing is evident.  C5-6:  A broad-based disc osteophyte complex present. Uncovertebral and facet hypertrophy is present bilaterally.  Mild moderate foraminal narrowing is present bilaterally.  There is partial effacement of the ventral CSF.  C6-7:  A broad-based disc osteophyte complex is present. Uncovertebral spurring is present bilaterally.  Moderate right and mild left foraminal stenosis is present.  C7-T1:  Negative.  IMPRESSION:  1.  Multilevel foraminal narrowing as described. 2.  No significant central canal stenosis or cord signal abnormality. 3.  Occluded right vertebral artery.  *RADIOLOGY REPORT*  MRI THORACIC SPINE WITHOUT CONTRAST  Technique:  Multiplanar and multiecho pulse sequences of the thoracic spine were obtained without intravenous contrast.  Findings:  Normal signal is present in the thoracic spine.  The conus medullaris terminates below L1, within normal limits.  Patient is status post vertebral augmentation at T11.  A sclerotic lesion is seen anteriorly at T9, measuring 13 x 21 mm on the sagittal images.  There are remote to compression fractures involving the superior endplate of T2 and more prominently  the inferior plate of T6.  No significant retropulsion of bone is present.  Exaggerated thoracic kyphosis is present.  Heterogeneous marrow signal in the ribs bilaterally is concerning for scattered metastatic disease.  No significant disc protrusions or stenoses are present.  IMPRESSION:  1.  Anterior T1 and T2 hypointense lesion at T9 is compatible with a focal metastasis. 2.  Remote compression fractures at T2 and T6 with exaggerated thoracic kyphosis. 3.  Status post vertebral augmentation at T11. 4.  Heterogeneity of marrow signal in the ribs suggests metastatic disease.  This corresponds with the recent bone scan. 5.  No significant cord compression or central canal stenosis.   Original Report Authenticated By: Marin Roberts, M.D.   Mr Thoracic Spine Wo Contrast  09/28/2012   *RADIOLOGY REPORT*  Clinical Data:  Gait difficulty.  Generalized weakness.  Pulling to the left when walking.  MRI HEAD WITHOUT CONTRAST MRI CERVICAL SPINE WITHOUT CONTRAST  Technique:  Multiplanar, multiecho pulse sequences of the brain and surrounding structures, and cervical spine, to include the craniocervical junction and cervicothoracic junction, were obtained without intravenous contrast.  Comparison:  CT head without contrast 09/27/2012.  MRI HEAD  Findings:  The diffusion weighted images demonstrate no evidence for acute or subacute infarction.  Extensive cerebellar infarcts are present bilaterally with evidence for remote hemorrhage in the inferior right cerebellum.  The right vertebral artery is occluded. Flow is present in the basilar artery and left vertebral artery. Flow is present in the anterior circulation.  Moderate generalized atrophy is present.  A remote right posterior frontal lobe cortical infarct is present.  Periventricular white matter changes are  evident bilaterally.  Wallerian degeneration is evident within the right cerebral peduncle.  No acute hemorrhage or mass lesion is present.  The patient is  status post bilateral lens extractions.  The globes and orbits are otherwise intact.  The patient is status post bilateral nasal antrostomies and ethmoidectomies.  Mild mucosal thickening is noted along the floor of the maxillary sinuses bilaterally.  IMPRESSION:  1.  No acute intracranial abnormality. 2.  Remote bilateral cerebellar infarcts, right greater than left. 3.  Remote hemorrhage along the inferior aspect of the right cerebellum. 4.  Occluded right vertebral artery. 5.  Remote focal cortical infarct in the posterior right frontal lobe. 5.  Moderate wallerian degeneration of the right cerebral peduncle. 6.  Moderate generalized atrophy and white matter disease bilaterally. 7.  Postoperative changes of sinuses with minimal residual mucosal thickening in the inferior maxillary sinuses bilaterally.  MRI CERVICAL SPINE  Findings: Normal signal is present in the cervical and upper thoracic spinal cord to the lowest imaged level, T2.  Marrow signal, vertebral body heights, alignment are normal.  There is fusion across the end plates at W2-9 and C4-5.  The craniocervical junction is within normal limits.  The right vertebral artery is occluded.  Flow is present in the remaining vascular structures of the neck.  C2-3:  Asymmetric right-sided uncovertebral facet hypertrophy is present without significant stenosis.  C3-4:  Mild uncovertebral and facet disease is present bilaterally. Mild right foraminal narrowing is present.  C4-5:  Asymmetric left-sided uncovertebral and facet hypertrophy is present.  Mild left foraminal narrowing is evident.  C5-6:  A broad-based disc osteophyte complex present. Uncovertebral and facet hypertrophy is present bilaterally.  Mild moderate foraminal narrowing is present bilaterally.  There is partial effacement of the ventral CSF.  C6-7:  A broad-based disc osteophyte complex is present. Uncovertebral spurring is present bilaterally.  Moderate right and mild left foraminal stenosis is  present.  C7-T1:  Negative.  IMPRESSION:  1.  Multilevel foraminal narrowing as described. 2.  No significant central canal stenosis or cord signal abnormality. 3.  Occluded right vertebral artery.  *RADIOLOGY REPORT*  MRI THORACIC SPINE WITHOUT CONTRAST  Technique:  Multiplanar and multiecho pulse sequences of the thoracic spine were obtained without intravenous contrast.  Findings:  Normal signal is present in the thoracic spine.  The conus medullaris terminates below L1, within normal limits.  Patient is status post vertebral augmentation at T11.  A sclerotic lesion is seen anteriorly at T9, measuring 13 x 21 mm on the sagittal images.  There are remote to compression fractures involving the superior endplate of T2 and more prominently the inferior plate of T6.  No significant retropulsion of bone is present.  Exaggerated thoracic kyphosis is present.  Heterogeneous marrow signal in the ribs bilaterally is concerning for scattered metastatic disease.  No significant disc protrusions or stenoses are present.  IMPRESSION:  1.  Anterior T1 and T2 hypointense lesion at T9 is compatible with a focal metastasis. 2.  Remote compression fractures at T2 and T6 with exaggerated thoracic kyphosis. 3.  Status post vertebral augmentation at T11. 4.  Heterogeneity of marrow signal in the ribs suggests metastatic disease.  This corresponds with the recent bone scan. 5.  No significant cord compression or central canal stenosis.   Original Report Authenticated By: Marin Roberts, M.D.    MEDICATIONS  Scheduled: . acetaminophen  500 mg Oral BID  . amLODipine  2.5 mg Oral Daily  . aspirin EC  81 mg Oral Daily  . atorvastatin  10 mg Oral Daily  . B-complex with vitamin C  1 tablet Oral Daily  . bicalutamide  50 mg Oral Daily  . bisoprolol  10 mg Oral Daily  . calcium carbonate  1 tablet Oral BID WC   . docusate sodium  100 mg Oral BID  . ezetimibe  10 mg Oral Daily  . furosemide  20 mg Oral Daily  . losartan  100 mg Oral Daily  . multivitamin with minerals  1 tablet Oral Daily  . potassium chloride SA  20 mEq Oral Daily  . sodium chloride  3 mL Intravenous Q12H  . warfarin  2.5 mg Oral Custom  . warfarin  5 mg Oral Custom  . Warfarin - Pharmacist Dosing Inpatient   Does not apply q1800    ASSESSMENT/PLAN:                                                                                                             79 year old male with progressive gait disturbance over the past 6-12 months. Suspect that this is most likely a multifactorial gait disturbance, however with signs of neuropathy and preserved reflexes at the knees, would like to rule out myelopathy. MRI brain was normal and MRI C-spine showed no significant stenosis.  MRI T spine showed anterior T1 and T2 hypointense lesion at T9 is compatible with a focal metastasis. A1c and B 12 were normal.   Recommend: 1) SPEP may be followed as out patient 2) EMG/NCV as out patient 3) As patient is aware of bone mets from previous scan months ago --recommend follow up with out patient oncology.    No further recommendations. Neurology S/O   Assessment and plan discussed with with attending physician and they are in agreement.    Felicie Morn PA-C [facility] [phone]  09/29/2012, 1:55 PM

## 2012-09-29 NOTE — Progress Notes (Signed)
Physical Therapy G-code Note:   10-21-2012 1210  PT G-Codes **NOT FOR INPATIENT CLASS**  Functional Assessment Tool Used Clinical Judgement  Functional Limitation Mobility: Walking and moving around  Mobility: Walking and Moving Around Current Status (Z6109) CI  Mobility: Walking and Moving Around Goal Status [phone]) CI  Mobility: Walking and Moving Around Discharge Status [phone]) CI  [place], [place] [phone]

## 2012-10-02 LAB — PROTEIN ELECTROPHORESIS, SERUM
Albumin ELP: 53.3 % — ABNORMAL LOW (ref 55.8–66.1)
Alpha-1-Globulin: 5.3 % — ABNORMAL HIGH (ref 2.9–4.9)
Alpha-2-Globulin: 12.5 % — ABNORMAL HIGH (ref 7.1–11.8)
Total Protein ELP: 6.1 g/dL (ref 6.0–8.3)

## 2012-10-03 ENCOUNTER — Telehealth: Payer: Self-pay | Admitting: Cardiology

## 2012-10-03 NOTE — Telephone Encounter (Signed)
Thanks for update

## 2012-10-03 NOTE — Telephone Encounter (Signed)
New Problem   Pt has been in the hospital for the last week. Dr. @ Hospital advised wife to call and update [facility]. [facility] is not requiring a eph) Pt will see a neurologist

## 2012-10-03 NOTE — Telephone Encounter (Signed)
Spoke with wife and will forward to  Dr. Patty Sermons so he will be aware.

## 2012-10-04 ENCOUNTER — Ambulatory Visit (INDEPENDENT_AMBULATORY_CARE_PROVIDER_SITE_OTHER): Payer: Medicare Other | Admitting: Diagnostic Neuroimaging

## 2012-10-04 ENCOUNTER — Encounter: Payer: Self-pay | Admitting: Diagnostic Neuroimaging

## 2012-10-04 VITALS — BP 146/81 | HR 58 | Temp 97.8°F | Ht 70.0 in | Wt 201.5 lb

## 2012-10-04 DIAGNOSIS — R531 Weakness: Secondary | ICD-10-CM

## 2012-10-04 DIAGNOSIS — C61 Malignant neoplasm of prostate: Secondary | ICD-10-CM

## 2012-10-04 DIAGNOSIS — I6789 Other cerebrovascular disease: Secondary | ICD-10-CM

## 2012-10-04 DIAGNOSIS — G464 Cerebellar stroke syndrome: Secondary | ICD-10-CM

## 2012-10-04 NOTE — Patient Instructions (Signed)
Try physical therapy

## 2012-10-04 NOTE — Progress Notes (Signed)
[facility]  PATIENT: Craig [name] DOB: [birth year]  REFERRING CLINICIAN: Chiu HISTORY FROM: patient and wife REASON FOR VISIT: new consult   HISTORICAL  CHIEF COMPLAINT:  Chief Complaint  Patient presents with  . 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  2002 patient had a cerebellar stroke, went through rehabilitation and eventually progressed from using a wheelchair, and to a cane, then 2 a walker within 3-4 months. Patient was doing fairly well until 2-1/2 years ago when he began to have increasing balance problems and falling. By December 2013 he was using a walker for balance.  09/27/2012, patient was brought to the hospital do to increasing balance and walking problems. Patient was falling down and then unable to stand up. He is also leaning towards one side. Patient was admitted for further evaluation. MRI of the brain, cervical and thoracic spine were performed which showed no acute findings. Patient was discharged home and recommended to have followup in outpatient neurology clinic with me for possible EMG nerve conduction study or other testing.  Patient has history of T11 and L3 vertebral body biopsies, positive for malignancy. Also status post kyphoplasty at T11 level. Patient continues to have low back pain, but not significant increase. She denies any numbness or tingling in his feet or toes.   REVIEW OF SYSTEMS: Full 14 system review of systems performed and notable only for fatigue swelling in legs hearing loss moles urination problems incontinence constipation joint pain easy bruising memory loss mouth sores speech mild difficulty swallowing  sleepiness decreased energy.  ALLERGIES: Allergies  Allergen Reactions  . Pravachol     Muscle weakness  . Zocor [Simvastatin - High Dose]     Muscle weakness    HOME MEDICATIONS: Prior to Admission medications   Medication Sig Start Date End Date Taking? Authorizing Provider  acetaminophen (TYLENOL) 500 MG tablet Take 500 mg by mouth 2 (two) times daily. Takes one in the AM and one qhs   Yes Historical Provider, MD  amLODipine (NORVASC) 2.5 MG tablet Take 1 tablet (2.5 mg total) by mouth daily. 12/29/11 12/28/12 Yes Rosalio Macadamia, NP  aspirin 81 MG tablet Take 81 mg by mouth daily.     Yes Historical Provider, MD  atorvastatin (LIPITOR) 10 MG tablet Take 1 tablet (10 mg total) by mouth daily. 11/16/11  Yes [name], MD  B Complex-C (B-COMPLEX WITH VITAMIN C) tablet Take 1 tablet by mouth daily.     Yes Historical Provider, MD  bicalutamide (CASODEX) 50 MG tablet Take 50 mg by mouth daily.   Yes Historical Provider, MD  bisoprolol-hydrochlorothiazide (ZIAC) 10-6.25 MG per tablet Take 1 tablet by mouth daily. 02/08/12  Yes [name], MD  calcium carbonate (OS-CAL) 600 MG TABS Take 600 mg by mouth 2 (two) times daily. Taking 1200 daily   Yes Historical Provider, MD  Denosumab (XGEVA ) Inject into the skin. monthly    Yes Historical Provider, MD  Docusate Calcium (STOOL SOFTENER PO) Take 1 tablet by mouth 2 (two) times daily.    Yes Historical Provider, MD  ezetimibe (ZETIA) 10 MG tablet Take 1 tablet (10 mg total) by mouth daily. 03/17/12  Yes [name], MD  furosemide (LASIX) 20 MG tablet Take  1 tablet (20 mg total) by mouth daily. 06/16/12 06/16/13 Yes [name], MD  Leuprolide Acetate (LUPRON DEPOT IM) Inject into the muscle every 3 (three) months.    Yes Historical Provider, MD  losartan-hydrochlorothiazide (HYZAAR) 100-25 MG per tablet Take 1 tablet by mouth daily. 03/17/12  Yes [name], MD  multivitamin [facility]) per tablet Take 1 tablet by mouth  daily. ( with B - Complex )   Yes Historical Provider, MD  Omega-3 Fatty Acids (FISH OIL PO) Take 1 capsule by mouth daily. ( Mega Red )   Yes Historical Provider, MD  potassium chloride SA (K-DUR,KLOR-CON) 20 MEQ tablet Take 1 tablet (20 mEq total) by mouth daily. 07/27/12  Yes [name], MD  warfarin (COUMADIN) 5 MG tablet Take 5 mg by mouth daily. Take 5mg   Monday and fridays and half a tablet every other day   Yes Historical Provider, MD   Outpatient Prescriptions Prior to Visit  Medication Sig Dispense Refill  . acetaminophen (TYLENOL) 500 MG tablet Take 500 mg by mouth 2 (two) times daily. Takes one in the AM and one qhs      . amLODipine (NORVASC) 2.5 MG tablet Take 1 tablet (2.5 mg total) by mouth daily.  30 tablet  11  . aspirin 81 MG tablet Take 81 mg by mouth daily.        Marland Kitchen atorvastatin (LIPITOR) 10 MG tablet Take 1 tablet (10 mg total) by mouth daily.  30 tablet  11  . B Complex-C (B-COMPLEX WITH VITAMIN C) tablet Take 1 tablet by mouth daily.        . bicalutamide (CASODEX) 50 MG tablet Take 50 mg by mouth daily.      . bisoprolol-hydrochlorothiazide (ZIAC) 10-6.25 MG per tablet Take 1 tablet by mouth daily.  90 tablet  3  . calcium carbonate (OS-CAL) 600 MG TABS Take 600 mg by mouth 2 (two) times daily. Taking 1200 daily      . Denosumab (XGEVA [place]) Inject into the skin. monthly       . Docusate Calcium (STOOL SOFTENER PO) Take 1 tablet by mouth 2 (two) times daily.       Marland Kitchen ezetimibe (ZETIA) 10 MG tablet Take 1 tablet (10 mg total) by mouth daily.  90 tablet  3  . furosemide (LASIX) 20 MG tablet Take 1 tablet (20 mg total) by mouth daily.  30 tablet  11  . Leuprolide Acetate (LUPRON DEPOT IM) Inject into the muscle every 3 (three) months.       Marland Kitchen losartan-hydrochlorothiazide (HYZAAR) 100-25 MG per tablet Take 1 tablet by mouth daily.  90 tablet  3  . multivitamin (THERAGRAN) per tablet Take 1 tablet by mouth daily. ( with B - Complex )      . Omega-3 Fatty Acids (FISH OIL PO)  Take 1 capsule by mouth daily. ( Mega Red )      . potassium chloride SA (K-DUR,KLOR-CON) 20 MEQ tablet Take 1 tablet (20 mEq total) by mouth daily.  30 tablet  3  . warfarin (COUMADIN) 5 MG tablet Take 5 mg by mouth daily. Take 5mg   Monday and fridays and half a tablet every other day       No facility-administered medications prior to visit.    PAST MEDICAL HISTORY: Past Medical History  Diagnosis Date  . Hyperlipidemia   . Hypokalemia   . Hypertensive heart disease   . Chronic back pain   . History of epistaxis 07/19/2002  . Personal  history of long-term (current) use of anticoagulants   . ICH (intracerebral hemorrhage) ~ 1999    after TPA/notes 09/27/2012  . Hypertension   . Atrial fibrillation   . PAT (paroxysmal atrial tachycardia)   . Pneumonia     "once; several years ago" (09/27/2012)  . GERD (gastroesophageal reflux disease)   . H/O hiatal hernia   . Migraines     "migraines without headaches years ago" (09/27/2012)  . Stroke ~ 1999    ischemic / right cerebellar/posterior inferior cerebellar artery / right pos infarct  . Melanoma     "top of my head" (09/27/2012)  . Prostate cancer, primary, with metastasis from prostate to other site     on Lupron injections per [facility]  . Osteoarthritis of both feet     PAST SURGICAL HISTORY: Past Surgical History  Procedure Laterality Date  . Nasal sinus surgery  1998  . Cataract extraction w/ intraocular lens  implant, bilateral Bilateral ~ 2011  . Skin graft Right 2010  . Melanoma excision  2010    "pre-melanoma on top of head; did skin graft from left thigh to cover" (09/27/2012)  . Tonsillectomy  ~ 1935    FAMILY HISTORY: Family History  Problem Relation Age of Onset  . Heart failure Mother   . Heart attack Father     SOCIAL HISTORY:  History   Social History  . Marital Status: Married    Spouse Name: Kathie Rhodes    Number of Children: 3  . Years of Education: MD   Occupational History  .     Social History Main Topics    . Smoking status: Former Smoker -- .5 years    Types: Cigarettes    Quit date: 02/22/1953  . Smokeless tobacco: Never Used  . Alcohol Use: Yes     Comment: 09/27/2012 "glass of wine or 2/month"  . Drug Use: No  . Sexual Activity: No   Other Topics Concern  . Not on file   Social History Narrative   Patient lives at home with spouse.   Caffeine Use: 1/4 cup daily     PHYSICAL EXAM  Filed Vitals:   10/04/12 1125  BP: 146/81  Pulse: 58  Temp: 97.8 F (36.6 C)  TempSrc: Oral  Height: 5\' 10"  (1.778 m)  Weight: 201 lb 8 oz (91.4 kg)    Not recorded    Body mass index is 28.91 kg/(m^2).  GENERAL EXAM: Patient is in no distress  CARDIOVASCULAR: Regular rate and rhythm, no murmurs, no carotid bruits  NEUROLOGIC: MENTAL STATUS: awake, alert, language fluent, comprehension intact, naming intact CRANIAL NERVE: no papilledema on fundoscopic exam, pupils equal and reactive to light, visual fields full to confrontation, extraocular muscles intact, no nystagmus, facial sensation and strength symmetric, uvula midline, shoulder shrug symmetric, tongue midline; HOARSE VOICE MOTOR: normal bulk and tone; BUE (DELTOIDS 4, TRICEPS 4, BICEPS 5, GRIP 5), BLE (HF 5, KE 5, KF 4, DF 3-4). NO TREMOR. MODERATE BRADYKINESIA IN BUE AND BLE. SENSORY: ABSENT VIB AT TOES AND ANKLES. DECR PP AND VIB IN BLE. COORDINATION: finger-nose-finger, fine finger movements SLOW REFLEXES: BUE 1, KNEES 1, ANKLES 0. GAIT/STATION: STOOPED POSTURE. DIFFICULTY STANDING UP FROM CHAIR. USES FOLDING GRAY WALKER. SLOW, SHORT STEPS.    DIAGNOSTIC DATA (LABS, IMAGING, TESTING) - I reviewed patient records, labs, notes, testing and imaging myself where available.  Lab Results  Component Value Date   WBC 9.9 09/27/2012   HGB 13.6 09/27/2012   HCT 39.0 09/27/2012  MCV 96.1 09/27/2012   PLT 150 09/27/2012      Component Value Date/Time   NA 141 09/28/2012 0425   K 3.5 09/28/2012 0425   CL 101 09/28/2012 0425   CO2 30 09/28/2012  0425   GLUCOSE 95 09/28/2012 0425   BUN 48* 09/28/2012 0425   CREATININE 1.74* 09/28/2012 0425   CALCIUM 10.7* 09/28/2012 0425   PROT 6.9 04/11/2012 0951   ALBUMIN 3.3* 09/27/2012 1750   AST 20 04/11/2012 0951   ALT 14 04/11/2012 0951   ALKPHOS 44 04/11/2012 0951   BILITOT 0.7 04/11/2012 0951   GFRNONAA 34* 09/28/2012 0425   GFRAA 39* 09/28/2012 0425   Lab Results  Component Value Date   CHOL 176 04/11/2012   HDL 48.10 04/11/2012   LDLCALC 94 04/11/2012   TRIG 168.0* 04/11/2012   CHOLHDL 4 04/11/2012   Lab Results  Component Value Date   HGBA1C 5.6 09/27/2012   Lab Results  Component Value Date   VITAMINB12 769 09/27/2012   Lab Results  Component Value Date   TSH 1.341 09/27/2012    09/28/12 MRI BRAIN  No acute intracranial abnormality. Remote bilateral cerebellar infarcts, right greater than left. Remote hemorrhage along the inferior aspect of the right cerebellum. occluded right vertebral artery. remote focal cortical infarct in the posterior right frontal lobe. Moderate generalized atrophy and white matter disease  bilaterally.   09/28/12 MRI CERVICAL SPINE 1. Multilevel foraminal narrowing as described.  2. No significant central canal stenosis or cord signal  abnormality.  3. Occluded right vertebral artery.  09/28/12 MRI THORACIC SPINE  1. Anterior T1 and T2 hypointense lesion at T9 is compatible with a focal metastasis.  2. Remote compression fractures at T2 and T6 with exaggerated thoracic kyphosis.  3. Status post vertebral augmentation at T11.  4. Heterogeneity of marrow signal in the ribs suggests metastatic disease. This corresponds with the recent bone scan.  5. No significant cord compression or central canal stenosis.   ASSESSMENT AND PLAN  79 y.o. year old male here with progressive lower extremity weakness and gait deterioration.  Ddx: lumbar spinal stenosis, lumbar radiculopathy, peripheral neuropathy (CIDP, autoimmune, paraneoplastic, idiopathic)  PLAN: 1. PT eval 2.  Options for further testing and treatment discussed (MRI lumbar spine, EMG/NCS, labs, spinal tap), but patient wants to pursue conservative mgmt given age and concomitant medical issues (met prostate CA, afib, stroke)   Orders Placed This Encounter  Procedures  . DME Other see comment  . Ambulatory referral to Physical Therapy   Return if symptoms worsen or fail to improve.   Suanne Marker, MD 10/04/2012, 12:32 PM Certified in Neurology, Neurophysiology and Neuroimaging  [facility] [address] [place], [place] [address] [phone]

## 2012-10-09 ENCOUNTER — Ambulatory Visit (INDEPENDENT_AMBULATORY_CARE_PROVIDER_SITE_OTHER): Payer: Medicare Other

## 2012-10-09 DIAGNOSIS — I4891 Unspecified atrial fibrillation: Secondary | ICD-10-CM

## 2012-10-09 LAB — POCT INR: INR: 3

## 2012-10-11 ENCOUNTER — Ambulatory Visit: Payer: Medicare Other | Attending: Diagnostic Neuroimaging

## 2012-10-11 DIAGNOSIS — R262 Difficulty in walking, not elsewhere classified: Secondary | ICD-10-CM | POA: Insufficient documentation

## 2012-10-11 DIAGNOSIS — IMO0001 Reserved for inherently not codable concepts without codable children: Secondary | ICD-10-CM | POA: Insufficient documentation

## 2012-10-11 DIAGNOSIS — R279 Unspecified lack of coordination: Secondary | ICD-10-CM | POA: Insufficient documentation

## 2012-10-13 ENCOUNTER — Ambulatory Visit: Payer: Medicare Other | Admitting: Physical Therapy

## 2012-10-16 ENCOUNTER — Ambulatory Visit: Payer: Medicare Other | Admitting: Physical Therapy

## 2012-10-19 ENCOUNTER — Ambulatory Visit: Payer: Medicare Other | Admitting: Physical Therapy

## 2012-10-24 ENCOUNTER — Encounter: Payer: Medicare Other | Admitting: Physical Therapy

## 2012-10-25 ENCOUNTER — Ambulatory Visit: Payer: Medicare Other | Attending: Diagnostic Neuroimaging | Admitting: Physical Therapy

## 2012-10-25 ENCOUNTER — Telehealth: Payer: Self-pay | Admitting: Oncology

## 2012-10-25 DIAGNOSIS — R279 Unspecified lack of coordination: Secondary | ICD-10-CM | POA: Insufficient documentation

## 2012-10-25 DIAGNOSIS — R262 Difficulty in walking, not elsewhere classified: Secondary | ICD-10-CM | POA: Insufficient documentation

## 2012-10-25 DIAGNOSIS — IMO0001 Reserved for inherently not codable concepts without codable children: Secondary | ICD-10-CM | POA: Insufficient documentation

## 2012-10-25 NOTE — Telephone Encounter (Signed)
C/D 10/25/12 for appt. 11/08/12

## 2012-10-25 NOTE — Telephone Encounter (Signed)
S/W PT WIFE IN RE TO NP APPT 09/17@ 1:30 W/DR. SHADAD. PER PT WIFE REQUESTED DATE DUE TO VACATION.  REFERRING DR. MATTHEW ESKRIDGE DX- METS OF MAGLINANT NEOPLASM TO BONE.  WELCOME PACKET MAILED.

## 2012-10-27 ENCOUNTER — Ambulatory Visit: Payer: Medicare Other

## 2012-11-03 ENCOUNTER — Other Ambulatory Visit: Payer: Self-pay | Admitting: Oncology

## 2012-11-03 DIAGNOSIS — C61 Malignant neoplasm of prostate: Secondary | ICD-10-CM

## 2012-11-06 ENCOUNTER — Ambulatory Visit: Payer: Medicare Other | Admitting: Physical Therapy

## 2012-11-06 ENCOUNTER — Ambulatory Visit (INDEPENDENT_AMBULATORY_CARE_PROVIDER_SITE_OTHER): Payer: Medicare Other | Admitting: *Deleted

## 2012-11-06 DIAGNOSIS — I4891 Unspecified atrial fibrillation: Secondary | ICD-10-CM

## 2012-11-06 LAB — POCT INR: INR: 2.5

## 2012-11-08 ENCOUNTER — Ambulatory Visit (HOSPITAL_BASED_OUTPATIENT_CLINIC_OR_DEPARTMENT_OTHER): Payer: Medicare Other | Admitting: Oncology

## 2012-11-08 ENCOUNTER — Other Ambulatory Visit (HOSPITAL_BASED_OUTPATIENT_CLINIC_OR_DEPARTMENT_OTHER): Payer: Medicare Other | Admitting: Lab

## 2012-11-08 ENCOUNTER — Encounter: Payer: Self-pay | Admitting: Oncology

## 2012-11-08 ENCOUNTER — Ambulatory Visit: Payer: Medicare Other

## 2012-11-08 ENCOUNTER — Telehealth: Payer: Self-pay | Admitting: Oncology

## 2012-11-08 VITALS — BP 141/69 | HR 49 | Temp 96.9°F | Resp 19 | Ht 70.0 in | Wt 205.3 lb

## 2012-11-08 DIAGNOSIS — C61 Malignant neoplasm of prostate: Secondary | ICD-10-CM

## 2012-11-08 DIAGNOSIS — C7951 Secondary malignant neoplasm of bone: Secondary | ICD-10-CM

## 2012-11-08 LAB — COMPREHENSIVE METABOLIC PANEL (CC13)
AST: 18 U/L (ref 5–34)
Albumin: 3.4 g/dL — ABNORMAL LOW (ref 3.5–5.0)
BUN: 41.8 mg/dL — ABNORMAL HIGH (ref 7.0–26.0)
Calcium: 12.1 mg/dL — ABNORMAL HIGH (ref 8.4–10.4)
Chloride: 99 mEq/L (ref 98–109)
Glucose: 90 mg/dl (ref 70–140)
Potassium: 3.9 mEq/L (ref 3.5–5.1)

## 2012-11-08 LAB — CBC WITH DIFFERENTIAL/PLATELET
Basophils Absolute: 0.1 10*3/uL (ref 0.0–0.1)
Eosinophils Absolute: 0.3 10*3/uL (ref 0.0–0.5)
HGB: 13 g/dL (ref 13.0–17.1)
NEUT#: 5.1 10*3/uL (ref 1.5–6.5)
RDW: 14.4 % (ref 11.0–14.6)
lymph#: 2.4 10*3/uL (ref 0.9–3.3)

## 2012-11-08 MED ORDER — ENZALUTAMIDE 40 MG PO CAPS: 160.0000 mg | ORAL_CAPSULE | Freq: Every day | ORAL | Status: DC

## 2012-11-08 NOTE — Telephone Encounter (Signed)
gv pt wife appt schedule for October.  °

## 2012-11-08 NOTE — Progress Notes (Signed)
Checked in new pt with no financial concerns. °

## 2012-11-08 NOTE — Progress Notes (Signed)
Please see consult note.  

## 2012-11-08 NOTE — Progress Notes (Signed)
Faxed xtandi prescription to [facility] °

## 2012-11-08 NOTE — Consult Note (Signed)
Reason for Referral: Prostate cancer.    HPI: This is a pleasant 79 year old gentleman referred to me for reevaluation of prostate cancer. He is a gentleman with past medical history of atrial fibrillation and CVA and presented with back pain in 2011. His evaluation included imaging studies which showed a compression fracture and his thoracic spine and eventually he found to have an elevated PSA of 19 and diffuse bony metastasis. It was unclear to me what his Gleason score was at the time and was treated with Lupron and a brief course of Casodex. His PSA dropped down to a nadir of 1.67 in the summer of 2011. Shortly thereafter PSA started to rise again and it was high as 5.4 in December of 2012. At that time, Casodex was restarted in his PSA dropped down again to a nadir of 2.41. He continues on the combination of Lupron and Casodex and most recently his PSA rose to 3.53 in May of 2014 and most recently up to 5.55 in August of 2014. A repeat bone scan in June of 2014 showed essentially improved bone lesions compared to March of 2011. He had not received any palliative radiation therapy that was evaluated in the past by Dr. Dayton Scrape from radiation oncology. Patient is referred to me for the evaluation for possible castration resistant prostate cancer.  Clinically, he is minimally symptomatic from his prostate cancer. He is not reporting any back pain shoulder pain or hip pain. He is not reporting any weight loss or appetite changes. He does report urinary incontinence and increase in urination but no hematuria or dysuria. Is not reporting any fevers or chills or sweats. Is not reporting any abdominal pain or discomfort. He does report knee pain and difficulty with mobility. He has also had a balance problems and false and currently requires a walker. He is not reporting any thrombosis or bleeding complications. He does have a history of stroke with speech problems but that have been chronic in nature. He is not  able to drive and really relies on his wife accompanies him today for most of his transportations and keeping up with his appointments. 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   Past Medical History  Diagnosis Date  . Hyperlipidemia   . Hypokalemia   . Hypertensive heart disease   . Chronic back pain   . History of epistaxis 07/19/2002  . Personal history of long-term (current) use of anticoagulants   . ICH (intracerebral hemorrhage) ~ 1999    after TPA/notes 09/27/2012  . Hypertension   . Atrial fibrillation   . PAT (paroxysmal atrial tachycardia)   . Pneumonia     "once; several years ago" (09/27/2012)  . GERD (gastroesophageal reflux disease)   . H/O hiatal hernia   . Migraines     "migraines without headaches years ago" (09/27/2012)  . Stroke ~ 1999    ischemic / right cerebellar/posterior inferior cerebellar artery / right pos infarct  . Melanoma     "top of my head" (09/27/2012)  . Prostate cancer, primary, with metastasis from prostate to other site     on Lupron injections per [facility]  . Osteoarthritis of both feet   :  Past Surgical History  Procedure Laterality Date  . Nasal sinus surgery  1998  . Cataract extraction w/ intraocular lens  implant, bilateral Bilateral ~ 2011  . Skin graft Right 2010  . Melanoma excision  2010    "pre-melanoma on top of head; did  skin graft from left thigh to cover" (09/27/2012)  . Tonsillectomy  ~ 1935  :  Current Outpatient Prescriptions  Medication Sig Dispense Refill  . acetaminophen (TYLENOL) 500 MG tablet Take 500 mg by mouth 2 (two) times daily. Takes one in the AM and one qhs      . amLODipine (NORVASC) 2.5 MG tablet Take 1 tablet (2.5 mg total) by mouth daily.  30 tablet  11  . aspirin 81 MG tablet Take 81 mg by mouth daily.        Marland Kitchen atorvastatin (LIPITOR) 10 MG tablet Take 1 tablet (10 mg total) by mouth daily.  30 tablet  11  . B Complex-C (B-COMPLEX WITH VITAMIN C) tablet Take 1 tablet by mouth  daily.        . bicalutamide (CASODEX) 50 MG tablet Take 50 mg by mouth daily.      . bisoprolol-hydrochlorothiazide (ZIAC) 10-6.25 MG per tablet Take 1 tablet by mouth daily.  90 tablet  3  . calcium carbonate (OS-CAL) 600 MG TABS Take 600 mg by mouth 2 (two) times daily. Taking 1200 daily      . Denosumab (XGEVA [place]) Inject into the skin. monthly       . Docusate Calcium (STOOL SOFTENER PO) Take 1 tablet by mouth 2 (two) times daily.       Marland Kitchen ezetimibe (ZETIA) 10 MG tablet Take 1 tablet (10 mg total) by mouth daily.  90 tablet  3  . furosemide (LASIX) 20 MG tablet Take 1 tablet (20 mg total) by mouth daily.  30 tablet  11  . Leuprolide Acetate (LUPRON DEPOT IM) Inject into the muscle every 3 (three) months.       Marland Kitchen losartan-hydrochlorothiazide (HYZAAR) 100-25 MG per tablet Take 1 tablet by mouth daily.  90 tablet  3  . multivitamin (THERAGRAN) per tablet Take 1 tablet by mouth daily. ( with B - Complex )      . Omega-3 Fatty Acids (FISH OIL PO) Take 1 capsule by mouth daily. ( Mega Red )      . potassium chloride SA (K-DUR,KLOR-CON) 20 MEQ tablet Take 1 tablet (20 mEq total) by mouth daily.  30 tablet  3  . warfarin (COUMADIN) 5 MG tablet Take 5 mg by mouth daily. Take 5mg   Monday and fridays and half a tablet every other day      . enzalutamide (XTANDI) 40 MG capsule Take 4 capsules (160 mg total) by mouth daily.  120 capsule  0   No current facility-administered medications for this visit.       Allergies  Allergen Reactions  . Pravachol     Muscle weakness  . Zocor [Simvastatin - High Dose]     Muscle weakness  :  Family History  Problem Relation Age of Onset  . Heart failure Mother   . Heart attack Father   :  History   Social History  . Marital Status: Married    Spouse Name: Kathie Rhodes    Number of Children: 3  . Years of Education: MD   Occupational History  .     Social History Main Topics  . Smoking status: Former Smoker -- .5 years    Types: Cigarettes    Quit  date: 02/22/1953  . Smokeless tobacco: Never Used  . Alcohol Use: Yes     Comment: 09/27/2012 "glass of wine or 2/month"  . Drug Use: No  . Sexual Activity: No   Other Topics Concern  .  Not on file   Social History Narrative   Patient lives at home with spouse.   Caffeine Use: 1/4 cup daily  :  A comprehensive review of systems was negative.  Exam: Blood pressure 141/69, pulse 49, temperature 96.9 F (36.1 C), temperature source Oral, resp. rate 19, height 5\' 10"  (1.778 m), weight 205 lb 4.8 oz (93.123 kg). General appearance: alert, cooperative and no distress Head: Normocephalic, without obvious abnormality, atraumatic Throat: lips, mucosa, and tongue normal; teeth and gums normal Neck: no adenopathy, no carotid bruit, no JVD, supple, symmetrical, trachea midline and thyroid not enlarged, symmetric, no tenderness/mass/nodules Resp: clear to auscultation bilaterally Chest wall: no tenderness Cardio: regular rate and rhythm, S1, S2 normal, no murmur, click, rub or gallop GI: normal findings: bowel sounds normal, liver span normal to percussion and no masses palpable Extremities: extremities normal, atraumatic, no cyanosis or edema Pulses: 2+ and symmetric Skin: Skin color, texture, turgor normal. No rashes or lesions Neurologic: Grossly normal   Recent Labs  11/08/12 1334  WBC 8.6  HGB 13.0  HCT 39.3  PLT 184       Assessment and Plan:   79 year old gentleman with the following issues:  1. Castration resistant prostate cancer with metastatic disease to the bone. The natural course of this disease was discussed today in detail with the patient and his wife. I've explained to them that really all treatments are palliative and the best options are to sequence these treatments in a way that we can continue to improve his quality of life and control his cancer. Options of treatments that would include second line hormonal manipulation with ketoconazole and prednisone,  Zytiga, Xtandi, Provenge immunotherapy and systemic chemotherapy. Giving the rise of his PSA with a rather rapid doubling time I feel that he needs and intervention sooner than later. The risks and benefits of all these medications in maneuvers were discussed I feel that Diana Eves is a good option for him. Risks and benefits of this drug was discussed including complications such as seizures, lower extremity swelling and fatigue. He is agreeable to proceed and I gave him written information today as well as a quick followup in 4 weeks to assess for any complications. I've also instructed him to stop Casodex at this time.  2. Androgen depravation: He is to continue Lupron at this time.  3. Bone directed therapy: I've also instructed him to continue Xgeva which she has been getting on a monthly basis.

## 2012-11-09 ENCOUNTER — Ambulatory Visit: Payer: Medicare Other

## 2012-11-10 ENCOUNTER — Telehealth: Payer: Self-pay | Admitting: Cardiology

## 2012-11-10 ENCOUNTER — Encounter: Payer: Self-pay | Admitting: Oncology

## 2012-11-10 NOTE — Progress Notes (Signed)
[facility], [id number], approved xtandi from 11/10/12-11/10/13.

## 2012-11-10 NOTE — Telephone Encounter (Signed)
New Problem  Pt is Being started on new medication 9/23 called Diana Eves,,, when the medication is given it cannot interfere w/ coumadin. Was told that pt will need a coumadin check on friday the 26th... wants to know if its okay for him to come back in for a coumadin check this soon. Please call back to advise.

## 2012-11-10 NOTE — Telephone Encounter (Signed)
Schedule coumadin check for patient next week as needed

## 2012-11-13 ENCOUNTER — Other Ambulatory Visit: Payer: Self-pay | Admitting: *Deleted

## 2012-11-13 DIAGNOSIS — E785 Hyperlipidemia, unspecified: Secondary | ICD-10-CM

## 2012-11-13 MED ORDER — ATORVASTATIN CALCIUM 10 MG PO TABS: 10.0000 mg | ORAL_TABLET | Freq: Every day | ORAL | Status: DC

## 2012-11-14 ENCOUNTER — Ambulatory Visit: Payer: Medicare Other

## 2012-11-15 NOTE — Progress Notes (Signed)
Confirmation faxed from [facility] that Craig [name] was shipped 11/14/12 to patient home with next day delivery.

## 2012-11-17 ENCOUNTER — Ambulatory Visit (INDEPENDENT_AMBULATORY_CARE_PROVIDER_SITE_OTHER): Payer: Medicare Other | Admitting: Pharmacist

## 2012-11-17 ENCOUNTER — Ambulatory Visit: Payer: Medicare Other | Admitting: Physical Therapy

## 2012-11-17 DIAGNOSIS — I4891 Unspecified atrial fibrillation: Secondary | ICD-10-CM

## 2012-11-17 LAB — POCT INR: INR: 3.3

## 2012-11-21 ENCOUNTER — Ambulatory Visit: Payer: Medicare Other

## 2012-11-24 ENCOUNTER — Ambulatory Visit: Payer: Medicare Other | Attending: Diagnostic Neuroimaging

## 2012-11-24 DIAGNOSIS — R279 Unspecified lack of coordination: Secondary | ICD-10-CM | POA: Insufficient documentation

## 2012-11-24 DIAGNOSIS — IMO0001 Reserved for inherently not codable concepts without codable children: Secondary | ICD-10-CM | POA: Insufficient documentation

## 2012-11-24 DIAGNOSIS — R262 Difficulty in walking, not elsewhere classified: Secondary | ICD-10-CM | POA: Insufficient documentation

## 2012-11-28 ENCOUNTER — Ambulatory Visit (INDEPENDENT_AMBULATORY_CARE_PROVIDER_SITE_OTHER): Payer: Medicare Other | Admitting: General Practice

## 2012-11-28 ENCOUNTER — Ambulatory Visit: Payer: Medicare Other

## 2012-11-28 DIAGNOSIS — I4891 Unspecified atrial fibrillation: Secondary | ICD-10-CM

## 2012-12-01 ENCOUNTER — Ambulatory Visit: Payer: Medicare Other

## 2012-12-04 ENCOUNTER — Ambulatory Visit: Payer: Medicare Other

## 2012-12-05 ENCOUNTER — Other Ambulatory Visit: Payer: Self-pay | Admitting: *Deleted

## 2012-12-05 MED ORDER — ENZALUTAMIDE 40 MG PO CAPS: 160.0000 mg | ORAL_CAPSULE | Freq: Every day | ORAL | Status: DC

## 2012-12-05 NOTE — Telephone Encounter (Signed)
THIS REFILL REQUEST FOR XTANDI WAS PLACED IN DR.SHADAD'S ACTIVE WORK FOLDER. 

## 2012-12-06 ENCOUNTER — Ambulatory Visit: Payer: Medicare Other | Admitting: Physical Therapy

## 2012-12-07 ENCOUNTER — Telehealth: Payer: Self-pay | Admitting: Oncology

## 2012-12-07 ENCOUNTER — Other Ambulatory Visit (HOSPITAL_BASED_OUTPATIENT_CLINIC_OR_DEPARTMENT_OTHER): Payer: Medicare Other | Admitting: Lab

## 2012-12-07 ENCOUNTER — Ambulatory Visit (HOSPITAL_BASED_OUTPATIENT_CLINIC_OR_DEPARTMENT_OTHER): Payer: Medicare Other | Admitting: Oncology

## 2012-12-07 VITALS — BP 158/84 | HR 62 | Temp 96.8°F | Resp 19 | Ht 70.0 in | Wt 202.3 lb

## 2012-12-07 DIAGNOSIS — C61 Malignant neoplasm of prostate: Secondary | ICD-10-CM

## 2012-12-07 DIAGNOSIS — E291 Testicular hypofunction: Secondary | ICD-10-CM

## 2012-12-07 DIAGNOSIS — C7951 Secondary malignant neoplasm of bone: Secondary | ICD-10-CM

## 2012-12-07 LAB — CBC WITH DIFFERENTIAL/PLATELET
BASO%: 0.9 % (ref 0.0–2.0)
Basophils Absolute: 0.1 10e3/uL (ref 0.0–0.1)
EOS%: 6.3 % (ref 0.0–7.0)
Eosinophils Absolute: 0.5 10e3/uL (ref 0.0–0.5)
HCT: 40.5 % (ref 38.4–49.9)
HGB: 13.5 g/dL (ref 13.0–17.1)
LYMPH%: 32 % (ref 14.0–49.0)
MCH: 32.3 pg (ref 27.2–33.4)
MCHC: 33.2 g/dL (ref 32.0–36.0)
MCV: 97.3 fL (ref 79.3–98.0)
MONO#: 0.8 10e3/uL (ref 0.1–0.9)
MONO%: 9.8 % (ref 0.0–14.0)
NEUT#: 4.1 10e3/uL (ref 1.5–6.5)
NEUT%: 51 % (ref 39.0–75.0)
Platelets: 162 10e3/uL (ref 140–400)
RBC: 4.17 10e6/uL — ABNORMAL LOW (ref 4.20–5.82)
RDW: 14.8 % — ABNORMAL HIGH (ref 11.0–14.6)
WBC: 8.1 10e3/uL (ref 4.0–10.3)
lymph#: 2.6 10e3/uL (ref 0.9–3.3)

## 2012-12-07 LAB — COMPREHENSIVE METABOLIC PANEL (CC13)
ALT: 10 U/L (ref 0–55)
AST: 17 U/L (ref 5–34)
Albumin: 3.4 g/dL — ABNORMAL LOW (ref 3.5–5.0)
Alkaline Phosphatase: 57 U/L (ref 40–150)
Anion Gap: 9 meq/L (ref 3–11)
BUN: 48.2 mg/dL — ABNORMAL HIGH (ref 7.0–26.0)
CO2: 32 meq/L — ABNORMAL HIGH (ref 22–29)
Calcium: 11.5 mg/dL — ABNORMAL HIGH (ref 8.4–10.4)
Chloride: 101 meq/L (ref 98–109)
Creatinine: 2 mg/dL — ABNORMAL HIGH (ref 0.7–1.3)
Glucose: 101 mg/dL (ref 70–140)
Potassium: 4.3 meq/L (ref 3.5–5.1)
Sodium: 143 meq/L (ref 136–145)
Total Bilirubin: 0.61 mg/dL (ref 0.20–1.20)
Total Protein: 7.6 g/dL (ref 6.4–8.3)

## 2012-12-07 NOTE — Telephone Encounter (Signed)
gave pt appt for lab and MD on November 2014

## 2012-12-07 NOTE — Progress Notes (Signed)
Hematology and Oncology Follow Up Visit  Arden Tinoco [id number] [birth year] 79 y.o. 12/07/2012 4:14 PM [name], MDBrackbill, [name], MD   Principle Diagnosis: 79 year old with Castration resistant prostate cancer with metastatic disease to the bone. He was initially diagnosed in 2011 PSA of 19 and presented with advanced disease.    Prior Therapy: He is status post combined androgen deprivation with Lupron and Casodex with an excellent response initially and had a PSA nadir down to 1.67. Most recently he developed progression of disease and a PSA up to 5.94 in September of 2014.  Current therapy: He is on Xtandi started on 11/15/2012. He continues to be on Lupron and Xgeva done at [facility].  Interim History:  79 year old gentleman presents today for a followup visit after he had been recently started on Xtandi. He has tolerated it fairly well for the last 3 weeks without any complications. His INR has been fluctuating higher than normal and Coumadin levels have been adjusted. He is not reporting any major changes in his clinical status. He is not reporting any increased lower extremity edema or decline in his appetite. He does not report any increased bony pain or deterioration in his mental status. His mobility and performance status remains about the same. He has not reported any chest pain difficulty breathing. Has not reported any cough or hemoptysis or hematemesis. Does not report any nausea or vomiting or abdominal pain. He does report increase in incontinence but no hematuria.  Medications: I have reviewed the patient's current medications.  Current Outpatient Prescriptions  Medication Sig Dispense Refill  . acetaminophen (TYLENOL) 500 MG tablet Take 500 mg by mouth 2 (two) times daily. Takes one in the AM and one qhs      . amLODipine (NORVASC) 2.5 MG tablet Take 1 tablet (2.5 mg total) by mouth daily.  30 tablet  11  . aspirin 81 MG tablet Take 81 mg by  mouth daily.        Marland Kitchen atorvastatin (LIPITOR) 10 MG tablet Take 1 tablet (10 mg total) by mouth daily.  30 tablet  1  . B Complex-C (B-COMPLEX WITH VITAMIN C) tablet Take 1 tablet by mouth daily.        . bisoprolol-hydrochlorothiazide (ZIAC) 10-6.25 MG per tablet Take 1 tablet by mouth daily.  90 tablet  3  . calcium carbonate (OS-CAL) 600 MG TABS Take 600 mg by mouth 2 (two) times daily. Taking 1200 daily      . Denosumab (XGEVA [place]) Inject into the skin. monthly       . Docusate Calcium (STOOL SOFTENER PO) Take 1 tablet by mouth 2 (two) times daily.       . enzalutamide (XTANDI) 40 MG capsule Take 4 capsules (160 mg total) by mouth daily.  120 capsule  0  . ezetimibe (ZETIA) 10 MG tablet Take 1 tablet (10 mg total) by mouth daily.  90 tablet  3  . furosemide (LASIX) 20 MG tablet Take 1 tablet (20 mg total) by mouth daily.  30 tablet  11  . Leuprolide Acetate (LUPRON DEPOT IM) Inject into the muscle every 3 (three) months.       Marland Kitchen losartan-hydrochlorothiazide (HYZAAR) 100-25 MG per tablet Take 1 tablet by mouth daily.  90 tablet  3  . multivitamin (THERAGRAN) per tablet Take 1 tablet by mouth daily. ( with B - Complex )      . Omega-3 Fatty Acids (FISH OIL PO) Take 1 capsule by mouth  daily. ( Mega Red )      . potassium chloride SA (K-DUR,KLOR-CON) 20 MEQ tablet Take 1 tablet (20 mEq total) by mouth daily.  30 tablet  3  . warfarin (COUMADIN) 5 MG tablet Take 5 mg by mouth daily. Take 5mg   Monday and fridays and half a tablet every other day       No current facility-administered medications for this visit.     Allergies:  Allergies  Allergen Reactions  . Pravachol     Muscle weakness  . Zocor [Simvastatin - High Dose]     Muscle weakness    Past Medical History, Surgical history, Social history, and Family History were reviewed and updated.  Review of Systems:  Remaining ROS negative. Physical Exam: Blood pressure 158/84, pulse 62, temperature 96.8 F (36 C), temperature source  Oral, resp. rate 19, height 5\' 10"  (1.778 m), weight 202 lb 4.8 oz (91.763 kg). ECOG: 2 General appearance: alert, cooperative and appears stated age Head: Normocephalic, without obvious abnormality, atraumatic Neck: no adenopathy, no carotid bruit, no JVD, supple, symmetrical, trachea midline and thyroid not enlarged, symmetric, no tenderness/mass/nodules Lymph nodes: Cervical, supraclavicular, and axillary nodes normal. Heart:regular rate and rhythm, S1, S2 normal, no murmur, click, rub or gallop Lung:chest clear, no wheezing, rales, normal symmetric air entry, Heart exam - S1, S2 normal, no murmur, no gallop, rate regular Abdomin: soft, non-tender, without masses or organomegaly EXT: 1+ edema noted.    Lab Results: Lab Results  Component Value Date   WBC 8.1 12/07/2012   HGB 13.5 12/07/2012   HCT 40.5 12/07/2012   MCV 97.3 12/07/2012   PLT 162 12/07/2012     Chemistry      Component Value Date/Time   NA 143 11/08/2012 1334   NA 141 09/28/2012 0425   K 3.9 11/08/2012 1334   K 3.5 09/28/2012 0425   CL 101 09/28/2012 0425   CO2 34* 11/08/2012 1334   CO2 30 09/28/2012 0425   BUN 41.8* 11/08/2012 1334   BUN 48* 09/28/2012 0425   CREATININE 2.0* 11/08/2012 1334   CREATININE 1.74* 09/28/2012 0425      Component Value Date/Time   CALCIUM 12.1* 11/08/2012 1334   CALCIUM 10.7* 09/28/2012 0425   ALKPHOS 56 11/08/2012 1334   ALKPHOS 44 04/11/2012 0951   AST 18 11/08/2012 1334   AST 20 04/11/2012 0951   ALT 15 11/08/2012 1334   ALT 14 04/11/2012 0951   BILITOT 0.66 11/08/2012 1334   BILITOT 0.7 04/11/2012 0951      Impression and Plan:  79 year old gentleman with the following issues:   1. Castration resistant prostate cancer with metastatic disease to the bone. He started [place] without any major complications and has been well tolerated. The plan is to continue on this current medication for at least another month and we will check his PSA at this time.  2. Androgen depravation: He is to continue  Lupron at this time.  3. Bone directed therapy: I've also instructed him to continue Xgeva which she has been getting on a monthly basis.  4. Hypercalcemia: He is asymptomatic at this point but I instructed his wife to decrease his calcium intake by 50% I will continue to monitor his counts closely. He is on Xgeva which should help bring his counts down.  Eli Hose, MD 10/16/20144:14 PM

## 2012-12-08 ENCOUNTER — Telehealth: Payer: Self-pay | Admitting: *Deleted

## 2012-12-08 LAB — PSA: PSA: 4.14 ng/mL — ABNORMAL HIGH (ref <=4.00)

## 2012-12-08 NOTE — Telephone Encounter (Signed)
Left message of PSA results on patient identified answering machine, per patient's request.

## 2012-12-08 NOTE — Telephone Encounter (Signed)
Message copied by Reesa Chew on Fri Dec 08, 2012  9:52 AM ------      Message from: [name]      Created: Fri Dec 08, 2012  7:32 AM       Please call his PSA and Ca. All coming down. ------

## 2012-12-12 ENCOUNTER — Other Ambulatory Visit: Payer: Self-pay

## 2012-12-12 ENCOUNTER — Ambulatory Visit (INDEPENDENT_AMBULATORY_CARE_PROVIDER_SITE_OTHER): Payer: Medicare Other | Admitting: *Deleted

## 2012-12-12 DIAGNOSIS — I4891 Unspecified atrial fibrillation: Secondary | ICD-10-CM

## 2012-12-12 LAB — POCT INR: INR: 2.1

## 2012-12-12 MED ORDER — AMLODIPINE BESYLATE 2.5 MG PO TABS: 2.5000 mg | ORAL_TABLET | Freq: Every day | ORAL | Status: DC

## 2012-12-12 NOTE — Telephone Encounter (Signed)
RECEIVED A FAX FROM [facility] CONCERNING A CONFIRMATION OF PRESCRIPTION SHIPMENT FOR XTANDI ON 12/11/12.

## 2012-12-26 ENCOUNTER — Ambulatory Visit (INDEPENDENT_AMBULATORY_CARE_PROVIDER_SITE_OTHER): Payer: Medicare Other | Admitting: General Practice

## 2012-12-26 DIAGNOSIS — I4891 Unspecified atrial fibrillation: Secondary | ICD-10-CM

## 2012-12-26 LAB — POCT INR: INR: 1.5

## 2012-12-29 ENCOUNTER — Other Ambulatory Visit: Payer: Self-pay | Admitting: *Deleted

## 2012-12-29 MED ORDER — AMLODIPINE BESYLATE 2.5 MG PO TABS: 2.5000 mg | ORAL_TABLET | Freq: Every day | ORAL | Status: DC

## 2013-01-01 ENCOUNTER — Other Ambulatory Visit: Payer: Self-pay

## 2013-01-01 DIAGNOSIS — E785 Hyperlipidemia, unspecified: Secondary | ICD-10-CM

## 2013-01-01 MED ORDER — ATORVASTATIN CALCIUM 10 MG PO TABS: 10.0000 mg | ORAL_TABLET | Freq: Every day | ORAL | Status: DC

## 2013-01-03 ENCOUNTER — Ambulatory Visit (INDEPENDENT_AMBULATORY_CARE_PROVIDER_SITE_OTHER): Payer: Medicare Other | Admitting: Cardiology

## 2013-01-03 ENCOUNTER — Encounter: Payer: Self-pay | Admitting: Cardiology

## 2013-01-03 VITALS — BP 147/77 | HR 69 | Ht 71.0 in | Wt 200.0 lb

## 2013-01-03 DIAGNOSIS — E7800 Pure hypercholesterolemia, unspecified: Secondary | ICD-10-CM

## 2013-01-03 DIAGNOSIS — I4891 Unspecified atrial fibrillation: Secondary | ICD-10-CM

## 2013-01-03 DIAGNOSIS — I119 Hypertensive heart disease without heart failure: Secondary | ICD-10-CM

## 2013-01-03 NOTE — Assessment & Plan Note (Signed)
The patient is on long-term Coumadin.  He has not been having any further TIA or stroke symptoms since earlier in the summer.  He now has a physical therapy training or coming out of the house and working with him twice a week.

## 2013-01-03 NOTE — Assessment & Plan Note (Signed)
Blood pressure has been remaining stable on current medication.  No dizziness or syncope. 

## 2013-01-03 NOTE — Progress Notes (Signed)
Craig [name]. 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  We had not seen him since February 2014. He has a history of essential hypertension and a previous thrombotic stroke.  Over the summer he was hospitalized for a suspected mild stroke with transient left-sided weakness. He has established atrial fibrillation and is on Coumadin. He has hypercholesterolemia and essential hypertension. He also has metastatic prostate cancer. His last visit he has been doing well. He is on injections for his bony metastases which have helped and he gets the injections once a month.  He is now under the care of Dr. Juliette Alcide.Marland Kitchen  He is on new medication  Xtandi.40 mg 4 tablets a day. His PSA has been declining.  He is having more overall weakness and also more incontinence.  He now has to wear depends.  He has nocturia x2.  Since last visit he has not been having any new cardiac symptoms.  Current Outpatient Prescriptions  Medication Sig Dispense Refill  . acetaminophen (TYLENOL) 500 MG tablet Take 500 mg by mouth 2 (two) times daily. Takes one in the AM and one qhs      . amLODipine (NORVASC) 2.5 MG tablet Take 1 tablet (2.5 mg total) by mouth daily.  90 tablet  0  . aspirin 81 MG tablet Take 81 mg by mouth daily.        Marland Kitchen atorvastatin (LIPITOR) 10 MG tablet Take 1 tablet (10 mg total) by mouth daily.  30 tablet  1  . B Complex-C (B-COMPLEX WITH VITAMIN C) tablet Take 1 tablet by mouth daily.        . bisoprolol-hydrochlorothiazide (ZIAC) 10-6.25 MG per tablet Take 1 tablet by mouth daily.  90 tablet  3  . calcium carbonate (OS-CAL) 600 MG TABS Take 600 mg by mouth 2 (two) times daily. Taking 1200 daily      . Denosumab (XGEVA [place]) Inject into the skin. monthly       . Docusate Calcium (STOOL SOFTENER PO) Take 1 tablet by mouth 2 (two)  times daily.       . enzalutamide (XTANDI) 40 MG capsule Take 4 capsules (160 mg total) by mouth daily.  120 capsule  0  . ezetimibe (ZETIA) 10 MG tablet Take 1 tablet (10 mg total) by mouth daily.  90 tablet  3  . furosemide (LASIX) 20 MG tablet Take 1 tablet (20 mg total) by mouth daily.  30 tablet  11  . Leuprolide Acetate (LUPRON DEPOT IM) Inject into the muscle every 3 (three) months.       Marland Kitchen losartan-hydrochlorothiazide (HYZAAR) 100-25 MG per tablet Take 1 tablet by mouth daily.  90 tablet  3  . multivitamin (THERAGRAN) per tablet Take 1 tablet by mouth daily. ( with B - Complex )      . Omega-3 Fatty Acids (FISH OIL PO) Take 1 capsule by mouth daily. ( Mega Red )      . potassium chloride SA (K-DUR,KLOR-CON) 20 MEQ tablet Take 1 tablet (20 mEq total) by mouth daily.  30 tablet  3  . warfarin (COUMADIN) 5 MG tablet Take 5 mg by mouth daily. Take 5mg   Monday and fridays and half a tablet every other day       No current facility-administered medications for this visit.    Allergies  Allergen Reactions  . Pravachol     Muscle weakness  . Zocor [Simvastatin - High Dose]     Muscle weakness    Patient Active Problem List   Diagnosis Date Noted  . Cerebellar stroke syndrome 07/27/2010    Priority: High  . Benign hypertensive heart disease without heart failure 07/27/2010    Priority: High  . Atrial fibrillation 05/25/2010    Priority: High  . Chronic kidney disease (CKD), stage IV (severe) 09/27/2012  . Elevated brain natriuretic peptide (BNP) level 09/27/2012  . Hypercalcemia 09/27/2012  . Weakness 09/27/2012  . Epistaxis 01/08/2011  . Hypercholesterolemia 07/27/2010  . Prostate cancer 07/27/2010  . Renal insufficiency 07/27/2010    History  Smoking status  . Former Smoker -- .5 years  . Types: Cigarettes  . Quit date: 02/22/1953  Smokeless tobacco  . Never Used    History  Alcohol Use  . Yes    Comment: 09/27/2012 "glass of wine or 2/month"    Family History    Problem Relation Age of Onset  . Heart failure Mother   . Heart attack Father     Review of Systems: The patient denies any heat or cold intolerance.  No weight gain or weight loss.  The patient denies headaches or blurry vision.  There is no cough or sputum production.  The patient denies dizziness.  There is no hematuria or hematochezia.  The patient denies any muscle aches or arthritis.  The patient denies any rash.  The patient denies frequent falling or instability.  There is no history of depression or anxiety.  All other systems were reviewed and are negative.   Physical Exam: Filed Vitals:   01/03/13 1459  BP: 147/77  Pulse: 69   General appearance reveals a well-developed elderly gentleman in no acute distress.  He walks with a walker.The head and neck exam reveals pupils equal and reactive.  Extraocular movements are full.  There is no scleral icterus.  The mouth and pharynx are normal.  The neck is supple.  The carotids reveal no bruits.  The jugular venous pressure is normal.  The  thyroid is not enlarged.  There is no lymphadenopathy.  The chest is clear to percussion and auscultation.  There are no rales or rhonchi.  Expansion of the chest is symmetrical.  The rhythm is irregularly irregular  The precordium is quiet.  The first heart sound is normal.  The second heart sound is physiologically split.  There is no murmur gallop rub or click.  There is no abnormal lift or heave.  The abdomen is soft and nontender.  The bowel sounds are normal.  The liver and spleen are not enlarged.  There are no abdominal masses.  There are no abdominal bruits.  Extremities reveal good pedal pulses.  There is mild bilateral pretibial edema.  There is no cyanosis or clubbing.  Strength is normal and symmetrical in all extremities.  There is no lateralizing weakness.  There are no sensory deficits.  The skin is warm and dry.  There is no rash.     Assessment / Plan: Continue same medication.  Recheck  in 4 months for followup office visit lipid panel hepatic function panel basal metabolic panel and CBC.

## 2013-01-03 NOTE — Patient Instructions (Signed)
Your physician recommends that you continue on your current medications as directed. Please refer to the Current Medication list given to you today.  Your physician recommends that you schedule a follow-up appointment in: 4 months with Dr. Patty Sermons.  Your physician recommends that you return for lab work in: 4 months at your follow up with Dr. Patty Sermons (CBC/BMET/HFP/LP). You need to be fasting for this blood work.

## 2013-01-03 NOTE — Assessment & Plan Note (Signed)
The patient has a history of hypercholesterolemia.  He is on ezetimibe and atorvastatin.  He has overall weakness but no myalgias

## 2013-01-04 ENCOUNTER — Other Ambulatory Visit: Payer: Self-pay | Admitting: *Deleted

## 2013-01-04 NOTE — Telephone Encounter (Signed)
THIS REFILL REQUEST FOR XTANDI WAS PLACED IN DR.SHADAD'S ACTIVE WORK FOLDER. 

## 2013-01-05 ENCOUNTER — Other Ambulatory Visit: Payer: Self-pay | Admitting: *Deleted

## 2013-01-05 MED ORDER — ENZALUTAMIDE 40 MG PO CAPS: 160.0000 mg | ORAL_CAPSULE | Freq: Every day | ORAL | Status: DC

## 2013-01-08 ENCOUNTER — Ambulatory Visit (INDEPENDENT_AMBULATORY_CARE_PROVIDER_SITE_OTHER): Payer: Medicare Other | Admitting: *Deleted

## 2013-01-08 DIAGNOSIS — I4891 Unspecified atrial fibrillation: Secondary | ICD-10-CM

## 2013-01-08 LAB — POCT INR: INR: 2

## 2013-01-10 ENCOUNTER — Telehealth: Payer: Self-pay | Admitting: *Deleted

## 2013-01-10 ENCOUNTER — Other Ambulatory Visit (HOSPITAL_BASED_OUTPATIENT_CLINIC_OR_DEPARTMENT_OTHER): Payer: Medicare Other | Admitting: Lab

## 2013-01-10 ENCOUNTER — Ambulatory Visit (HOSPITAL_BASED_OUTPATIENT_CLINIC_OR_DEPARTMENT_OTHER): Payer: Medicare Other | Admitting: Oncology

## 2013-01-10 ENCOUNTER — Encounter (INDEPENDENT_AMBULATORY_CARE_PROVIDER_SITE_OTHER): Payer: Self-pay

## 2013-01-10 ENCOUNTER — Telehealth: Payer: Self-pay | Admitting: Oncology

## 2013-01-10 VITALS — BP 155/70 | HR 66 | Temp 96.9°F | Resp 18 | Ht 71.0 in | Wt 201.2 lb

## 2013-01-10 DIAGNOSIS — C61 Malignant neoplasm of prostate: Secondary | ICD-10-CM

## 2013-01-10 DIAGNOSIS — E291 Testicular hypofunction: Secondary | ICD-10-CM

## 2013-01-10 DIAGNOSIS — C7951 Secondary malignant neoplasm of bone: Secondary | ICD-10-CM

## 2013-01-10 LAB — CBC WITH DIFFERENTIAL/PLATELET
BASO%: 0.8 % (ref 0.0–2.0)
Basophils Absolute: 0.1 10*3/uL (ref 0.0–0.1)
Eosinophils Absolute: 0.2 10*3/uL (ref 0.0–0.5)
HGB: 14.6 g/dL (ref 13.0–17.1)
MCHC: 32.6 g/dL (ref 32.0–36.0)
MONO#: 0.7 10*3/uL (ref 0.1–0.9)
RBC: 4.51 10*6/uL (ref 4.20–5.82)
WBC: 8.1 10*3/uL (ref 4.0–10.3)
lymph#: 1.9 10*3/uL (ref 0.9–3.3)

## 2013-01-10 LAB — COMPREHENSIVE METABOLIC PANEL (CC13)
ALT: 9 U/L (ref 0–55)
AST: 19 U/L (ref 5–34)
Albumin: 3.7 g/dL (ref 3.5–5.0)
Alkaline Phosphatase: 66 U/L (ref 40–150)
Anion Gap: 12 meq/L — ABNORMAL HIGH (ref 3–11)
BUN: 44.2 mg/dL — ABNORMAL HIGH (ref 7.0–26.0)
CO2: 30 meq/L — ABNORMAL HIGH (ref 22–29)
Calcium: 11.6 mg/dL — ABNORMAL HIGH (ref 8.4–10.4)
Chloride: 103 meq/L (ref 98–109)
Creatinine: 1.9 mg/dL — ABNORMAL HIGH (ref 0.7–1.3)
Glucose: 97 mg/dL (ref 70–140)
Potassium: 3.9 meq/L (ref 3.5–5.1)
Sodium: 145 meq/L (ref 136–145)
Total Bilirubin: 0.91 mg/dL (ref 0.20–1.20)
Total Protein: 7.8 g/dL (ref 6.4–8.3)

## 2013-01-10 LAB — PSA: PSA: 9.49 ng/mL — ABNORMAL HIGH

## 2013-01-10 NOTE — Telephone Encounter (Signed)
Received noticeDiana [name] shipped 01/09/13

## 2013-01-10 NOTE — Progress Notes (Signed)
Hematology and Oncology Follow Up Visit  Craig [name] [id number] 11/11/1924 79 y.o. 01/10/2013 3:26 PM Craig [name], MDBrackbill, Craig Fus, MD   Principle Diagnosis: 79 year old with Castration resistant prostate cancer with metastatic disease to the bone. He was initially diagnosed in 2011 PSA of 19 and presented with advanced disease.    Prior Therapy: He is status post combined androgen deprivation with Lupron and Casodex with an excellent response initially and had a PSA nadir down to 1.67. Most recently he developed progression of disease and a PSA up to 5.94 in September of 2014.  Current therapy: He is on Xtandi started on 11/15/2012. He continues to be on Lupron and Xgeva done at [facility].  Interim History:  79 year old gentleman presents today for a followup visit after he had been started on Xtandi in 10/2012. He has tolerated it fairly well  without any complications. He is not reporting any major changes in his clinical status. He is not reporting any increased lower extremity edema or decline in his appetite. He does not report any increased bony pain or deterioration in his mental status. His mobility and performance status remains about the same.  He has not reported any chest pain difficulty breathing. Has not reported any cough or hemoptysis or hematemesis. Does not report any nausea or vomiting or abdominal pain. He does report increase in incontinence since he started the [place]. He did lose 1 pound from the last visit.  Medications: I have reviewed the patient's current medications.  Current Outpatient Prescriptions  Medication Sig Dispense Refill  . acetaminophen (TYLENOL) 500 MG tablet Take 500 mg by mouth 2 (two) times daily. Takes one in the AM and one qhs      . amLODipine (NORVASC) 2.5 MG tablet Take 1 tablet (2.5 mg total) by mouth daily.  90 tablet  0  . aspirin 81 MG tablet Take 81 mg by mouth daily.        Marland Kitchen atorvastatin (LIPITOR) 10 MG tablet  Take 1 tablet (10 mg total) by mouth daily.  30 tablet  1  . B Complex-C (B-COMPLEX WITH VITAMIN C) tablet Take 1 tablet by mouth daily.        . bisoprolol-hydrochlorothiazide (ZIAC) 10-6.25 MG per tablet Take 1 tablet by mouth daily.  90 tablet  3  . calcium carbonate (OS-CAL) 600 MG TABS Take 600 mg by mouth 2 (two) times daily. Taking 1200 daily      . Denosumab (XGEVA [place]) Inject into the skin. monthly       . Docusate Calcium (STOOL SOFTENER PO) Take 1 tablet by mouth 2 (two) times daily.       . enzalutamide (XTANDI) 40 MG capsule Take 4 capsules (160 mg total) by mouth daily.  120 capsule  0  . ezetimibe (ZETIA) 10 MG tablet Take 1 tablet (10 mg total) by mouth daily.  90 tablet  3  . furosemide (LASIX) 20 MG tablet Take 1 tablet (20 mg total) by mouth daily.  30 tablet  11  . Leuprolide Acetate (LUPRON DEPOT IM) Inject into the muscle every 3 (three) months.       Marland Kitchen losartan-hydrochlorothiazide (HYZAAR) 100-25 MG per tablet Take 1 tablet by mouth daily.  90 tablet  3  . multivitamin (THERAGRAN) per tablet Take 1 tablet by mouth daily. ( with B - Complex )      . Omega-3 Fatty Acids (FISH OIL PO) Take 1 capsule by mouth daily. ( Mega Red )      .  potassium chloride SA (K-DUR,KLOR-CON) 20 MEQ tablet Take 1 tablet (20 mEq total) by mouth daily.  30 tablet  3  . warfarin (COUMADIN) 5 MG tablet Take 5 mg by mouth daily. Take 5mg   Monday and fridays and half a tablet every other day       No current facility-administered medications for this visit.     Allergies:  Allergies  Allergen Reactions  . Pravachol     Muscle weakness  . Zocor [Simvastatin - High Dose]     Muscle weakness    Past Medical History, Surgical history, Social history, and Family History were reviewed and updated.  Review of Systems:  Remaining ROS negative. Physical Exam: Blood pressure 155/70, pulse 66, temperature 96.9 F (36.1 C), temperature source Axillary, resp. rate 18, height 5\' 11"  (1.803 m), weight  201 lb 3.2 oz (91.264 kg), SpO2 98.00%. ECOG: 2 General appearance: alert, cooperative and appears stated age Head: Normocephalic, without obvious abnormality, atraumatic Neck: no adenopathy, no carotid bruit, no JVD, supple, symmetrical, trachea midline and thyroid not enlarged, symmetric, no tenderness/mass/nodules Lymph nodes: Cervical, supraclavicular, and axillary nodes normal. Heart:regular rate and rhythm, S1, S2 normal, no murmur, click, rub or gallop Lung:chest clear, no wheezing, rales, normal symmetric air entry, Heart exam - S1, S2 normal, no murmur, no gallop, rate regular Abdomin: soft, non-tender, without masses or organomegaly EXT: 1+ edema noted.    Lab Results: Lab Results  Component Value Date   WBC 8.1 01/10/2013   HGB 14.6 01/10/2013   HCT 44.6 01/10/2013   MCV 98.9* 01/10/2013   PLT 174 01/10/2013     Chemistry      Component Value Date/Time   NA 145 01/10/2013 1443   NA 141 09/28/2012 0425   K 3.9 01/10/2013 1443   K 3.5 09/28/2012 0425   CL 101 09/28/2012 0425   CO2 30* 01/10/2013 1443   CO2 30 09/28/2012 0425   BUN 44.2* 01/10/2013 1443   BUN 48* 09/28/2012 0425   CREATININE 1.9* 01/10/2013 1443   CREATININE 1.74* 09/28/2012 0425      Component Value Date/Time   CALCIUM 11.6* 01/10/2013 1443   CALCIUM 10.7* 09/28/2012 0425   ALKPHOS 66 01/10/2013 1443   ALKPHOS 44 04/11/2012 0951   AST 19 01/10/2013 1443   AST 20 04/11/2012 0951   ALT 9 01/10/2013 1443   ALT 14 04/11/2012 0951   BILITOT 0.91 01/10/2013 1443   BILITOT 0.7 04/11/2012 0951     Results for Craig [name], Craig [name] (MRN [id number]) as of 01/10/2013 15:27  Ref. Range 11/08/2012 13:34 12/07/2012 15:36  PSA Latest Range: <=4.00 ng/mL 5.94 (H) 4.14 (H)   Impression and Plan:  79 year old gentleman with the following issues:   1. Castration resistant prostate cancer with metastatic disease to the bone. He started [place] without any major complications and has been well tolerated. His PSA dropped from  5.94 to  4.14 and only a few weeks of therapy. The plan is to continue on this current medication for at least another month and we will check his PSA at this time.  2. Androgen depravation: He is to continue Lupron at this time.  3. Bone directed therapy: I've also instructed him to continue Xgeva which she has been getting on a monthly basis.  4. Hypercalcemia: He is asymptomatic at this point and his calcium is improving with his 50% decrease his risk by mouth calcium intake.  5. Followup: Will be in 4 weeks.  Eli Hose, MD 11/19/20143:26 PM

## 2013-01-10 NOTE — Telephone Encounter (Signed)
gv and printed appt sched and avs for pt for DEc °

## 2013-01-11 ENCOUNTER — Telehealth: Payer: Self-pay | Admitting: Medical Oncology

## 2013-01-11 NOTE — Telephone Encounter (Signed)
Message copied by Rexene Edison on Thu Jan 11, 2013 11:50 AM ------      Message from: [name]      Created: Thu Jan 11, 2013  9:07 AM       Please call him or his wife with the PSA. It is up but I want him to continue with the current medication. ------

## 2013-01-11 NOTE — Telephone Encounter (Signed)
Per MD, informed spouse of patient's PSA and to continue with current medication. Patient expressed concern d/t increase but verbalized understanding for pt to continue with medication. Encouraged to call office with any questions or concerns.

## 2013-01-23 ENCOUNTER — Ambulatory Visit (INDEPENDENT_AMBULATORY_CARE_PROVIDER_SITE_OTHER): Payer: Medicare Other | Admitting: General Practice

## 2013-01-23 DIAGNOSIS — I4891 Unspecified atrial fibrillation: Secondary | ICD-10-CM

## 2013-02-02 ENCOUNTER — Other Ambulatory Visit: Payer: Self-pay | Admitting: *Deleted

## 2013-02-02 DIAGNOSIS — C61 Malignant neoplasm of prostate: Secondary | ICD-10-CM

## 2013-02-02 MED ORDER — ENZALUTAMIDE 40 MG PO CAPS: 160.0000 mg | ORAL_CAPSULE | Freq: Every day | ORAL | Status: DC

## 2013-02-02 NOTE — Telephone Encounter (Signed)
THIS REFILL REQUEST FOR XTANDI WAS PLACED IN DR.SHADAD'S ACTIVE WORK FOLDER. 

## 2013-02-02 NOTE — Addendum Note (Signed)
Addended by: Arvilla Meres on: 02/02/2013 04:17 PM   Modules accepted: Orders

## 2013-02-02 NOTE — Telephone Encounter (Signed)
RECEIVED A FAX FROM [facility] CONCERNING A CONFIRMATION OF FACSIMILE RECEIPT FOR PT. REFERRAL. 

## 2013-02-05 ENCOUNTER — Telehealth: Payer: Self-pay | Admitting: Medical Oncology

## 2013-02-05 NOTE — Telephone Encounter (Signed)
Spouse called stating patient have increased weakness in legs "severee, can't get up the stairs" and feels it is related to xtandi. Reviewed with MD, patient to hold xtandi for four days and to call office to let us know whether symptoms improve. Patient is scheduled for appt with MD 02/08/13 and wife states will discuss with MD during that appt about any changes. Wife gave verbal understanding and will hold xtandi for four days and confirmed appt for the 18th.

## 2013-02-06 ENCOUNTER — Emergency Department (HOSPITAL_COMMUNITY): Payer: Medicare Other

## 2013-02-06 ENCOUNTER — Encounter (HOSPITAL_COMMUNITY): Payer: Self-pay | Admitting: Emergency Medicine

## 2013-02-06 ENCOUNTER — Emergency Department (HOSPITAL_COMMUNITY)
Admission: EM | Admit: 2013-02-06 | Discharge: 2013-02-07 | Disposition: A | Discharge status: Home / Self Care | Payer: Medicare Other | Attending: Emergency Medicine | Admitting: Emergency Medicine

## 2013-02-06 DIAGNOSIS — Z7901 Long term (current) use of anticoagulants: Secondary | ICD-10-CM | POA: Insufficient documentation

## 2013-02-06 DIAGNOSIS — C61 Malignant neoplasm of prostate: Secondary | ICD-10-CM | POA: Insufficient documentation

## 2013-02-06 DIAGNOSIS — Y92009 Unspecified place in unspecified non-institutional (private) residence as the place of occurrence of the external cause: Secondary | ICD-10-CM | POA: Insufficient documentation

## 2013-02-06 DIAGNOSIS — Y9301 Activity, walking, marching and hiking: Secondary | ICD-10-CM | POA: Insufficient documentation

## 2013-02-06 DIAGNOSIS — S0191XA Laceration without foreign body of unspecified part of head, initial encounter: Secondary | ICD-10-CM

## 2013-02-06 DIAGNOSIS — M549 Dorsalgia, unspecified: Secondary | ICD-10-CM | POA: Insufficient documentation

## 2013-02-06 DIAGNOSIS — Z8709 Personal history of other diseases of the respiratory system: Secondary | ICD-10-CM | POA: Insufficient documentation

## 2013-02-06 DIAGNOSIS — Z7982 Long term (current) use of aspirin: Secondary | ICD-10-CM | POA: Insufficient documentation

## 2013-02-06 DIAGNOSIS — I119 Hypertensive heart disease without heart failure: Secondary | ICD-10-CM | POA: Insufficient documentation

## 2013-02-06 DIAGNOSIS — Z85820 Personal history of malignant melanoma of skin: Secondary | ICD-10-CM | POA: Insufficient documentation

## 2013-02-06 DIAGNOSIS — R296 Repeated falls: Secondary | ICD-10-CM | POA: Insufficient documentation

## 2013-02-06 DIAGNOSIS — Z8673 Personal history of transient ischemic attack (TIA), and cerebral infarction without residual deficits: Secondary | ICD-10-CM | POA: Insufficient documentation

## 2013-02-06 DIAGNOSIS — Z87891 Personal history of nicotine dependence: Secondary | ICD-10-CM | POA: Insufficient documentation

## 2013-02-06 DIAGNOSIS — K219 Gastro-esophageal reflux disease without esophagitis: Secondary | ICD-10-CM | POA: Insufficient documentation

## 2013-02-06 DIAGNOSIS — Z8701 Personal history of pneumonia (recurrent): Secondary | ICD-10-CM | POA: Insufficient documentation

## 2013-02-06 DIAGNOSIS — M6281 Muscle weakness (generalized): Secondary | ICD-10-CM | POA: Insufficient documentation

## 2013-02-06 DIAGNOSIS — I4891 Unspecified atrial fibrillation: Secondary | ICD-10-CM | POA: Insufficient documentation

## 2013-02-06 DIAGNOSIS — M19079 Primary osteoarthritis, unspecified ankle and foot: Secondary | ICD-10-CM | POA: Insufficient documentation

## 2013-02-06 DIAGNOSIS — G8929 Other chronic pain: Secondary | ICD-10-CM | POA: Insufficient documentation

## 2013-02-06 DIAGNOSIS — Z8719 Personal history of other diseases of the digestive system: Secondary | ICD-10-CM | POA: Insufficient documentation

## 2013-02-06 DIAGNOSIS — Z8679 Personal history of other diseases of the circulatory system: Secondary | ICD-10-CM | POA: Insufficient documentation

## 2013-02-06 DIAGNOSIS — Z888 Allergy status to other drugs, medicaments and biological substances status: Secondary | ICD-10-CM | POA: Insufficient documentation

## 2013-02-06 DIAGNOSIS — IMO0002 Reserved for concepts with insufficient information to code with codable children: Secondary | ICD-10-CM | POA: Insufficient documentation

## 2013-02-06 DIAGNOSIS — E785 Hyperlipidemia, unspecified: Secondary | ICD-10-CM | POA: Insufficient documentation

## 2013-02-06 DIAGNOSIS — Z79899 Other long term (current) drug therapy: Secondary | ICD-10-CM | POA: Insufficient documentation

## 2013-02-06 DIAGNOSIS — Z8669 Personal history of other diseases of the nervous system and sense organs: Secondary | ICD-10-CM | POA: Insufficient documentation

## 2013-02-06 DIAGNOSIS — W19XXXA Unspecified fall, initial encounter: Secondary | ICD-10-CM

## 2013-02-06 DIAGNOSIS — S0100XA Unspecified open wound of scalp, initial encounter: Secondary | ICD-10-CM | POA: Insufficient documentation

## 2013-02-06 LAB — BASIC METABOLIC PANEL
CO2: 32 mEq/L (ref 19–32)
Chloride: 101 mEq/L (ref 96–112)
Creatinine, Ser: 1.66 mg/dL — ABNORMAL HIGH (ref 0.50–1.35)
GFR calc Af Amer: 41 mL/min — ABNORMAL LOW (ref >90)
Potassium: 3.7 mEq/L (ref 3.5–5.1)
Sodium: 139 mEq/L (ref 135–145)

## 2013-02-06 LAB — CBC
MCHC: 33.6 g/dL (ref 30.0–36.0)
MCV: 100.2 fL — ABNORMAL HIGH (ref 78.0–100.0)
Platelets: 142 10*3/uL — ABNORMAL LOW (ref 150–400)
RBC: 4.27 MIL/uL (ref 4.22–5.81)
RDW: 14.7 % (ref 11.5–15.5)
WBC: 8.5 10*3/uL (ref 4.0–10.5)

## 2013-02-06 LAB — PROTIME-INR
INR: 1.67 — ABNORMAL HIGH (ref 0.00–1.49)
Prothrombin Time: 19.2 seconds — ABNORMAL HIGH (ref 11.6–15.2)

## 2013-02-06 NOTE — ED Provider Notes (Signed)
CSN: [id number]     Arrival date & time 02/06/13  2057 History   First MD Initiated Contact with Patient 02/06/13 2255     No chief complaint on file.  (Consider location/radiation/quality/duration/timing/severity/associated sxs/prior Treatment) HPI History provided by patient and his wife. Has a history of A. fib on Coumadin. Takes a medication called Xtandia with history of prostate cancer.  He stopped this medication 2 days ago for bilateral lower extremity weakness per his wife. Followed by oncology at [facility] long. Tonight walking out of the bathroom, lost his balance and fell backwards striking his head on the counter. He sustained a laceration to his posterior scalp. Bleeding controlled prior to arrival. He denies LOC. He denies any neck pain or neck injury. He denies any new weakness or numbness. He sustained a small abrasion to his left wrist with bleeding controlled and full range of motion without deformity. He declines an x-ray of this area.  No other injury. Fall. No chest pain or shortness of breath. No palpitations or abdominal pain or headache. He declines pain medication. Last tetanus shot was 2 years ago Past Medical History  Diagnosis Date  . Hyperlipidemia   . Hypokalemia   . Hypertensive heart disease   . Chronic back pain   . History of epistaxis 07/19/2002  . Personal history of long-term (current) use of anticoagulants   . ICH (intracerebral hemorrhage) ~ 1999    after TPA/notes 09/27/2012  . Hypertension   . Atrial fibrillation   . PAT (paroxysmal atrial tachycardia)   . Pneumonia     "once; several years ago" (09/27/2012)  . GERD (gastroesophageal reflux disease)   . H/O hiatal hernia   . Migraines     "migraines without headaches years ago" (09/27/2012)  . Stroke ~ 1999    ischemic / right cerebellar/posterior inferior cerebellar artery / right pos infarct  . Melanoma     "top of my head" (09/27/2012)  . Prostate cancer, primary, with metastasis from prostate to  other site     on Lupron injections per GU  . Osteoarthritis of both feet    Past Surgical History  Procedure Laterality Date  . Nasal sinus surgery  1998  . Cataract extraction w/ intraocular lens  implant, bilateral Bilateral ~ 2011  . Skin graft Right 2010  . Melanoma excision  2010    "pre-melanoma on top of head; did skin graft from left thigh to cover" (09/27/2012)  . Tonsillectomy  ~ 1935   Family History  Problem Relation Age of Onset  . Heart failure Mother   . Heart attack Father    History  Substance Use Topics  . Smoking status: Former Smoker -- .5 years    Types: Cigarettes    Quit date: 02/22/1953  . Smokeless tobacco: Never Used  . Alcohol Use: Yes     Comment: 09/27/2012 "glass of wine or 2/month"    Review of Systems  Constitutional: Negative for diaphoresis and fatigue.  Eyes: Negative for visual disturbance.  Respiratory: Negative for shortness of breath.   Cardiovascular: Negative for chest pain.  Gastrointestinal: Negative for vomiting and abdominal pain.  Musculoskeletal: Negative for back pain and neck pain.  Skin: Positive for wound.  Neurological: Negative for syncope, speech difficulty and headaches.  All other systems reviewed and are negative.    Allergies  Pravachol and Zocor  Home Medications   Current Outpatient Rx  Name  Route  Sig  Dispense  Refill  . acetaminophen (TYLENOL) 500 MG  tablet   Oral   Take 500 mg by mouth 2 (two) times daily. Takes one in the AM and one qhs         . amLODipine (NORVASC) 2.5 MG tablet   Oral   Take 1 tablet (2.5 mg total) by mouth daily.   90 tablet   0     .Marland KitchenPatient needs to contact office to schedule  App ...   . aspirin 81 MG chewable tablet   Oral   Chew 81 mg by mouth daily.         Marland Kitchen atorvastatin (LIPITOR) 10 MG tablet   Oral   Take 1 tablet (10 mg total) by mouth daily.   30 tablet   1     PATIENT NEEDS TO CALL OUR OFFICE TO SCHEDULE A FOL ...   . B Complex-C (B-COMPLEX WITH  VITAMIN C) tablet   Oral   Take 1 tablet by mouth daily.           . bisoprolol-hydrochlorothiazide (ZIAC) 10-6.25 MG per tablet   Oral   Take 1 tablet by mouth daily.   90 tablet   3   . calcium carbonate (OS-CAL) 600 MG TABS   Oral   Take 600 mg by mouth daily with breakfast. Taking 1200 daily         . Denosumab (XGEVA [place])   Subcutaneous   Inject into the skin every 30 (thirty) days. monthly         . Docusate Calcium (STOOL SOFTENER PO)   Oral   Take 1 tablet by mouth 2 (two) times daily.          Marland Kitchen ezetimibe (ZETIA) 10 MG tablet   Oral   Take 1 tablet (10 mg total) by mouth daily.   90 tablet   3   . furosemide (LASIX) 20 MG tablet   Oral   Take 1 tablet (20 mg total) by mouth daily.   30 tablet   11   . Leuprolide Acetate (LUPRON DEPOT IM)   Intramuscular   Inject into the muscle every 3 (three) months.          Marland Kitchen losartan-hydrochlorothiazide (HYZAAR) 100-25 MG per tablet   Oral   Take 1 tablet by mouth daily.   90 tablet   3   . multivitamin (THERAGRAN) per tablet   Oral   Take 1 tablet by mouth daily. ( with B - Complex )         . Omega-3 Fatty Acids (FISH OIL PO)   Oral   Take 1 capsule by mouth daily. ( Mega Red )         . potassium chloride SA (K-DUR,KLOR-CON) 20 MEQ tablet   Oral   Take 1 tablet (20 mEq total) by mouth daily.   30 tablet   3   . solifenacin (VESICARE) 5 MG tablet   Oral   Take 5 mg by mouth daily.         Marland Kitchen warfarin (COUMADIN) 5 MG tablet   Oral   Take 2.5-5 mg by mouth daily. 5mg  Monday, Wednesday, Friday; 2.5mg  the rest of the week         . enzalutamide (XTANDI) 40 MG capsule   Oral   Take 4 capsules (160 mg total) by mouth daily.   120 capsule   0     E-scribed to biologics    BP 201/91  Pulse 55  Temp(Src) 97.7 F (36.5 C) (Oral)  Resp 15  SpO2 98% Physical Exam  Constitutional: He is oriented to person, place, and time. He appears well-developed and well-nourished.  HENT:  Head:  Normocephalic.  Approximately 2-1/2 cm curvilinear laceration with moderate gape to posterior scalp. Hemostatic. No underlying bony tenderness or deformity.  Eyes: EOM are normal. Pupils are equal, round, and reactive to light.  Neck: Neck supple.  No cervical spine tenderness or deformity  Cardiovascular: Intact distal pulses.   Irregular rhythm, heart rate 60 to 70s  Pulmonary/Chest: Effort normal and breath sounds normal. No respiratory distress. He exhibits no tenderness.  Abdominal: Soft. He exhibits no distension. There is no tenderness.  Musculoskeletal: Normal range of motion. He exhibits no edema and no tenderness.  Left wrist ulnar aspect with small curvilinear superficial laceration, hemostatic with wound edges well approximated.  Full Range of motion without swelling and no underlying tenderness.  Neurological: He is alert and oriented to person, place, and time. No cranial nerve deficit. Coordination normal.  Skin: Skin is warm and dry.    ED Course  LACERATION REPAIR Date/Time: 02/07/2013 1:25 AM Performed by: Sunnie Nielsen Authorized by: Sunnie Nielsen Consent: Verbal consent obtained. Risks and benefits: risks, benefits and alternatives were discussed Consent given by: patient Patient understanding: patient states understanding of the procedure being performed Patient consent: the patient's understanding of the procedure matches consent given Procedure consent: procedure consent matches procedure scheduled Relevant documents: relevant documents present and verified Required items: required blood products, implants, devices, and special equipment available Patient identity confirmed: verbally with patient Time out: Immediately prior to procedure a "time out" was called to verify the correct patient, procedure, equipment, support staff and site/side marked as required. Body area: head/neck Location details: scalp Laceration length: 2.5 cm Contamination: The wound is  contaminated. Anesthesia: local infiltration Local anesthetic: lidocaine 1% with epinephrine Anesthetic total: 3 ml Preparation: Patient was prepped and draped in the usual sterile fashion. Irrigation solution: saline Irrigation method: syringe Amount of cleaning: extensive Debridement: none Skin closure: 3-0 nylon Number of sutures: 4 Technique: simple Approximation: close Approximation difficulty: simple Dressing: antibiotic ointment and 4x4 sterile gauze Patient tolerance: Patient tolerated the procedure well with no immediate complications.   (including critical care time) Labs Review Labs Reviewed  PROTIME-INR - Abnormal; Notable for the following:    Prothrombin Time 19.2 (*)    INR 1.67 (*)    All other components within normal limits  CBC - Abnormal; Notable for the following:    MCV 100.2 (*)    Platelets 142 (*)    All other components within normal limits  BASIC METABOLIC PANEL - Abnormal; Notable for the following:    Glucose, Bld 112 (*)    BUN 41 (*)    Creatinine, Ser 1.66 (*)    Calcium 11.0 (*)    GFR calc non Af Amer 36 (*)    GFR calc Af Amer 41 (*)    All other components within normal limits   Imaging Review Ct Head Wo Contrast  02/07/2013   CLINICAL DATA:  Fall.  Coumadin use.  EXAM: CT HEAD WITHOUT CONTRAST  TECHNIQUE: Contiguous axial images were obtained from the base of the skull through the vertex without intravenous contrast.  COMPARISON:  09/27/2012  FINDINGS: Skull and Sinuses:Left parietal scalp laceration. No underlying fracture. The imaged paranasal sinuses are clear, status post maxillectomies and ethmoidectomies. There is a tiny polypoid structure in the upper left nasal cavity which is stable - a probable polyp.  Orbits: Bilateral  cataract resection.  Brain: No evidence of acute abnormality, such as acute infarction, hemorrhage, hydrocephalus, or mass lesion/mass effect.  Extensive remote infarcts to the bilateral cerebellum, right more than  left, with atrophy of the cerebellum and brainstem. Supratentorial remote ischemic changes with patchy cerebral white matter low density, consistent with chronic small vessel ischemia. Remote small cortical ischemic injuries to the high right and left frontal lobes.  IMPRESSION: 1. No evidence of acute intracranial hemorrhage/injury. 2. Left parietal scalp laceration without calvarial fracture. 3. Extensive remote cerebral and cerebellar ischemic injury.   Electronically Signed   By: Tiburcio Pea M.D.   On: 02/07/2013 00:22   PTs wife concerned about his BP - he normally takes meds in the am, multiple rechecks remains elevated, hydralazine provided  Wound cleaned and irrigated extensively. Ice applied and wound repaired.  Plan d/c home with SR 7 days, wife is RN, she and PT state understanding delayed head bleed precautions.    infx precautions also provided and verbalized as understood. Will keep f/u Heme Onc as scheduled.   MDM   DX: fall, head laceration  CT reviewed no ICB or Fx Labs reviewed - INR 1.6 and crt 1.6 Medications provided Wound sutrured VS and nurses notes reviewed - BP improved with hydralazine  Sunnie Nielsen, MD 02/07/13 0126

## 2013-02-06 NOTE — ED Notes (Signed)
Larey Seat ~ 2000 coming out of bathroom. Hit back of head on dresser.  Bilateral leg weakness. Started on a new prostate cancer drug in 9/14 until this past Sunday. Laceration (1 1/4" laceration) bleeding controlled. No loc. Pt. On coumadin for atrial fib.

## 2013-02-06 NOTE — ED Notes (Signed)
Per pts wife pt on comadin

## 2013-02-07 ENCOUNTER — Telehealth: Payer: Self-pay | Admitting: Cardiology

## 2013-02-07 MED ORDER — HYDRALAZINE HCL 20 MG/ML IJ SOLN
10.0000 mg | Freq: Once | INTRAMUSCULAR | Status: AC
  Administered 2013-02-07: 10 mg via INTRAVENOUS
  Filled 2013-02-07: qty 1

## 2013-02-07 NOTE — Telephone Encounter (Signed)
Spoke with pt's wife.  Pt fell last night and had laceration to his head.  Required sutures but CT was negative.  INR was 1.6.  Due to timing of hospital visit, pt missed his dose of Coumadin last night.  Will have him take 1 1/2 tablets today and have rescheduled his appt to 12/22.

## 2013-02-07 NOTE — Telephone Encounter (Signed)
New message     Pt fell last night and hit head---went to ER.  His pt/inr at the hosp was 1.6 and he did not take coumadin last night.  He has an appt tomorrow.  How much coumadin should he take tonight?

## 2013-02-08 ENCOUNTER — Ambulatory Visit (HOSPITAL_BASED_OUTPATIENT_CLINIC_OR_DEPARTMENT_OTHER): Payer: Medicare Other | Admitting: Oncology

## 2013-02-08 ENCOUNTER — Telehealth: Payer: Self-pay | Admitting: *Deleted

## 2013-02-08 ENCOUNTER — Other Ambulatory Visit (HOSPITAL_BASED_OUTPATIENT_CLINIC_OR_DEPARTMENT_OTHER): Payer: Medicare Other

## 2013-02-08 ENCOUNTER — Telehealth: Payer: Self-pay | Admitting: Oncology

## 2013-02-08 VITALS — BP 142/61 | HR 55 | Resp 19 | Ht 71.0 in | Wt 193.1 lb

## 2013-02-08 DIAGNOSIS — C61 Malignant neoplasm of prostate: Secondary | ICD-10-CM

## 2013-02-08 DIAGNOSIS — C7951 Secondary malignant neoplasm of bone: Secondary | ICD-10-CM

## 2013-02-08 DIAGNOSIS — E291 Testicular hypofunction: Secondary | ICD-10-CM

## 2013-02-08 LAB — CBC WITH DIFFERENTIAL/PLATELET
BASO%: 0.5 % (ref 0.0–2.0)
Basophils Absolute: 0 10*3/uL (ref 0.0–0.1)
Eosinophils Absolute: 0.4 10*3/uL (ref 0.0–0.5)
HCT: 46.4 % (ref 38.4–49.9)
LYMPH%: 20.2 % (ref 14.0–49.0)
MCHC: 32.9 g/dL (ref 32.0–36.0)
MONO#: 0.8 10*3/uL (ref 0.1–0.9)
NEUT#: 5.1 10*3/uL (ref 1.5–6.5)
NEUT%: 64.9 % (ref 39.0–75.0)
RBC: 4.62 10*6/uL (ref 4.20–5.82)
WBC: 7.9 10*3/uL (ref 4.0–10.3)
lymph#: 1.6 10*3/uL (ref 0.9–3.3)

## 2013-02-08 LAB — COMPREHENSIVE METABOLIC PANEL (CC13)
ALT: 9 U/L (ref 0–55)
Alkaline Phosphatase: 73 U/L (ref 40–150)
Anion Gap: 12 mEq/L — ABNORMAL HIGH (ref 3–11)
BUN: 39.5 mg/dL — ABNORMAL HIGH (ref 7.0–26.0)
CO2: 32 mEq/L — ABNORMAL HIGH (ref 22–29)
Calcium: 11.7 mg/dL — ABNORMAL HIGH (ref 8.4–10.4)
Chloride: 101 mEq/L (ref 98–109)
Glucose: 111 mg/dl (ref 70–140)
Sodium: 144 mEq/L (ref 136–145)
Total Bilirubin: 1.45 mg/dL — ABNORMAL HIGH (ref 0.20–1.20)
Total Protein: 7.7 g/dL (ref 6.4–8.3)

## 2013-02-08 LAB — PSA: PSA: 10.15 ng/mL — ABNORMAL HIGH (ref <=4.00)

## 2013-02-08 MED ORDER — ENZALUTAMIDE 40 MG PO CAPS: 80.0000 mg | ORAL_CAPSULE | Freq: Every day | ORAL | Status: DC

## 2013-02-08 NOTE — Telephone Encounter (Signed)
appts made per 12/18 POF AVS and CAL given shh °

## 2013-02-08 NOTE — Telephone Encounter (Signed)
Lm for patient's wife to call me.

## 2013-02-08 NOTE — Progress Notes (Signed)
Hematology and Oncology Follow Up Visit  Craig [name] [id number] 02/17/1925 79 y.o. 02/08/2013 1:34 PM [name], MDBrackbill, [name], MD   Principle Diagnosis: 79 year old with Castration resistant prostate cancer with metastatic disease to the bone. He was initially diagnosed in 2011 PSA of 19 and presented with advanced disease.    Prior Therapy: He is status post combined androgen deprivation with Lupron and Casodex with an excellent response initially and had a PSA nadir down to 1.67. Most recently he developed progression of disease and a PSA up to 5.94 in September of 2014.  Current therapy: He is on Xtandi started on 11/15/2012. He continues to be on Lupron and Xgeva done at [facility].  Interim History:  79 year old gentleman presents today for a followup visit after he had been started on Xtandi in 10/2012. He tolerated the first 2 months of therapy without any complications but in the last month he reported more c edema and overall weakness. He reported one episode of a fall and required emergency department visits and scalp laceration. I instructed him to hold the Xtandi pill today. He felt slightly better with stopping the medication. He has not reported any chest pain difficulty breathing. Has not reported any cough or hemoptysis or hematemesis. Does not report any nausea or vomiting or abdominal pain. He does report increase in incontinence as well. Is not reporting any back pain or neurological symptoms.  Medications: I have reviewed the patient's current medications.  Current Outpatient Prescriptions  Medication Sig Dispense Refill  . acetaminophen (TYLENOL) 500 MG tablet Take 500 mg by mouth 2 (two) times daily. Takes one in the AM and one qhs      . amLODipine (NORVASC) 2.5 MG tablet Take 1 tablet (2.5 mg total) by mouth daily.  90 tablet  0  . aspirin 81 MG chewable tablet Chew 81 mg by mouth daily.      Marland Kitchen atorvastatin (LIPITOR) 10 MG tablet Take 1  tablet (10 mg total) by mouth daily.  30 tablet  1  . B Complex-C (B-COMPLEX WITH VITAMIN C) tablet Take 1 tablet by mouth daily.        . bisoprolol-hydrochlorothiazide (ZIAC) 10-6.25 MG per tablet Take 1 tablet by mouth daily.  90 tablet  3  . calcium carbonate (OS-CAL) 600 MG TABS Take 600 mg by mouth daily with breakfast. Taking 1200 daily      . Denosumab (XGEVA [place]) Inject into the skin every 30 (thirty) days. monthly      . Docusate Calcium (STOOL SOFTENER PO) Take 1 tablet by mouth 2 (two) times daily.       . enzalutamide (XTANDI) 40 MG capsule Take 2 capsules (80 mg total) by mouth daily.  120 capsule  0  . ezetimibe (ZETIA) 10 MG tablet Take 1 tablet (10 mg total) by mouth daily.  90 tablet  3  . furosemide (LASIX) 20 MG tablet Take 1 tablet (20 mg total) by mouth daily.  30 tablet  11  . Leuprolide Acetate (LUPRON DEPOT IM) Inject into the muscle every 3 (three) months.       Marland Kitchen losartan-hydrochlorothiazide (HYZAAR) 100-25 MG per tablet Take 1 tablet by mouth daily.  90 tablet  3  . multivitamin (THERAGRAN) per tablet Take 1 tablet by mouth daily. ( with B - Complex )      . Omega-3 Fatty Acids (FISH OIL PO) Take 1 capsule by mouth daily. ( Mega Red )      .  potassium chloride SA (K-DUR,KLOR-CON) 20 MEQ tablet Take 1 tablet (20 mEq total) by mouth daily.  30 tablet  3  . solifenacin (VESICARE) 5 MG tablet Take 5 mg by mouth daily.      Marland Kitchen warfarin (COUMADIN) 5 MG tablet Take 2.5-5 mg by mouth daily. 5mg  Monday, Wednesday, Friday; 2.5mg  the rest of the week       No current facility-administered medications for this visit.     Allergies:  Allergies  Allergen Reactions  . Pravachol     Muscle weakness  . Zocor [Simvastatin - High Dose]     Muscle weakness    Past Medical History, Surgical history, Social history, and Family History were reviewed and updated.  Review of Systems:  Remaining ROS negative. Physical Exam: Blood pressure 142/61, pulse 55, resp. rate 19, height 5'  11" (1.803 m), weight 193 lb 1.6 oz (87.59 kg). ECOG: 2 General appearance: alert, cooperative and appears stated age Head: Normocephalic, without obvious abnormality, atraumatic Neck: no adenopathy, no carotid bruit, no JVD, supple, symmetrical, trachea midline and thyroid not enlarged, symmetric, no tenderness/mass/nodules Lymph nodes: Cervical, supraclavicular, and axillary nodes normal. Heart:regular rate and rhythm, S1, S2 normal, no murmur, click, rub or gallop Lung:chest clear, no wheezing, rales, normal symmetric air entry, Heart exam - S1, S2 normal, no murmur, no gallop, rate regular Abdomin: soft, non-tender, without masses or organomegaly EXT: 1+ edema noted.    Lab Results: Lab Results  Component Value Date   WBC 7.9 02/08/2013   HGB 15.3 02/08/2013   HCT 46.4 02/08/2013   MCV 100.3* 02/08/2013   PLT 154 02/08/2013     Chemistry      Component Value Date/Time   NA 139 02/06/2013 2304   NA 145 01/10/2013 1443   K 3.7 02/06/2013 2304   K 3.9 01/10/2013 1443   CL 101 02/06/2013 2304   CO2 32 02/06/2013 2304   CO2 30* 01/10/2013 1443   BUN 41* 02/06/2013 2304   BUN 44.2* 01/10/2013 1443   CREATININE 1.66* 02/06/2013 2304   CREATININE 1.9* 01/10/2013 1443      Component Value Date/Time   CALCIUM 11.0* 02/06/2013 2304   CALCIUM 11.6* 01/10/2013 1443   ALKPHOS 66 01/10/2013 1443   ALKPHOS 44 04/11/2012 0951   AST 19 01/10/2013 1443   AST 20 04/11/2012 0951   ALT 9 01/10/2013 1443   ALT 14 04/11/2012 0951   BILITOT 0.91 01/10/2013 1443   BILITOT 0.7 04/11/2012 0951     Results for JOAB, CARDEN (MRN [id number]) as of 02/08/2013 13:36  Ref. Range 12/07/2012 15:36 01/10/2013 14:43  PSA Latest Range: <=4.00 ng/mL 4.14 (H) 9.49 (H)    Impression and Plan:  79 year old gentleman with the following issues:   1. Castration resistant prostate cancer with metastatic disease to the bone. He started [place] without any major complications and has been well  tolerated. His PSA dropped from 5.94 to  4.14 and only a few weeks of therapy. His last PSA did go up to 9.49. Given his new symptoms I will decrease Xtandi by 50% and drive for one month. He continued to have complications we will switch this medication altogether.  2. Androgen depravation: He is to continue Lupron at this time.  3. Bone directed therapy: I've also instructed him to continue Xgeva which she has been getting on a monthly basis.  4. Hypercalcemia: He is asymptomatic at this point and his calcium is improving with his 50% decrease his risk by  mouth calcium intake.  5. Followup: Will be in 4 weeks.  Eli Hose, MD 12/18/20141:34 PM

## 2013-02-08 NOTE — Telephone Encounter (Signed)
Called wife betty and gave her the names and phone numbers of 2 agencies that provide home care. [facility].

## 2013-02-09 ENCOUNTER — Telehealth: Payer: Self-pay | Admitting: Medical Oncology

## 2013-02-09 NOTE — Telephone Encounter (Signed)
Per MD, informed patient of PSA results and confirmed January appt. Denies questions at this time.

## 2013-02-09 NOTE — Telephone Encounter (Signed)
Message copied by Rexene Edison on Fri Feb 09, 2013 12:10 PM ------      Message from: [name]      Created: Fri Feb 09, 2013 10:44 AM       Please call the patient (or his wife) with his PSA. Slight change and stick with the plan discussed in clinic. ------

## 2013-02-12 ENCOUNTER — Ambulatory Visit (INDEPENDENT_AMBULATORY_CARE_PROVIDER_SITE_OTHER): Payer: Medicare Other | Admitting: *Deleted

## 2013-02-12 DIAGNOSIS — I4891 Unspecified atrial fibrillation: Secondary | ICD-10-CM

## 2013-02-20 ENCOUNTER — Telehealth: Payer: Self-pay | Admitting: Dietician

## 2013-02-20 NOTE — Telephone Encounter (Signed)
Nutrition Brief Note  Patient identified on [facility] Nutrition Screen.   Wt Readings from Last 15 Encounters:  02/08/13 193 lb 1.6 oz (87.59 kg)  01/10/13 201 lb 3.2 oz (91.264 kg)  01/03/13 200 lb (90.719 kg)  12/07/12 202 lb 4.8 oz (91.763 kg)  11/08/12 205 lb 4.8 oz (93.123 kg)  10/04/12 201 lb 8 oz (91.4 kg)  09/29/12 198 lb 1.6 oz (89.858 kg)  04/13/12 199 lb 9.6 oz (90.538 kg)  12/07/11 206 lb (93.441 kg)  07/22/11 197 lb 12.8 oz (89.721 kg)  03/22/11 201 lb (91.173 kg)  01/22/11 203 lb (92.08 kg)  01/08/11 198 lb 6.4 oz (89.994 kg)  11/12/10 203 lb (92.08 kg)  07/27/10 198 lb 9.6 oz (90.084 kg)   Pt with prostate CA with mets to bone. Currently on [place], Lupron, [place]. He also has hx of weight fluctuations due to fluid.   Wt hx reveals UBW of 199#. Noted a 4% wt loss x 1 month, which is not significant. Noted pt with edema and on diuretics, which may be contributing to weight loss. Appetite remains good.   No nutrition interventions at this time.   If further nutrition needs arise, please consult [facility].   Damiana Berrian A. Mayford Knife, RD, LDN Pager: [phone]

## 2013-02-21 ENCOUNTER — Other Ambulatory Visit: Payer: Self-pay

## 2013-02-23 ENCOUNTER — Other Ambulatory Visit: Payer: Self-pay

## 2013-02-23 MED ORDER — AMLODIPINE BESYLATE 2.5 MG PO TABS: 2.5000 mg | ORAL_TABLET | Freq: Every day | ORAL | Status: DC

## 2013-02-23 MED ORDER — BISOPROLOL-HYDROCHLOROTHIAZIDE 10-6.25 MG PO TABS: 1.0000 | ORAL_TABLET | Freq: Every day | ORAL | Status: DC

## 2013-02-27 ENCOUNTER — Ambulatory Visit (INDEPENDENT_AMBULATORY_CARE_PROVIDER_SITE_OTHER): Payer: Medicare Other | Admitting: Pharmacist

## 2013-02-27 DIAGNOSIS — I4891 Unspecified atrial fibrillation: Secondary | ICD-10-CM

## 2013-02-27 LAB — POCT INR: INR: 2.1

## 2013-03-20 ENCOUNTER — Encounter: Payer: Self-pay | Admitting: Oncology

## 2013-03-20 ENCOUNTER — Telehealth: Payer: Self-pay | Admitting: Oncology

## 2013-03-20 ENCOUNTER — Other Ambulatory Visit: Payer: Self-pay | Admitting: *Deleted

## 2013-03-20 ENCOUNTER — Other Ambulatory Visit: Payer: Self-pay

## 2013-03-20 ENCOUNTER — Ambulatory Visit (HOSPITAL_BASED_OUTPATIENT_CLINIC_OR_DEPARTMENT_OTHER): Payer: Medicare Other | Admitting: Oncology

## 2013-03-20 ENCOUNTER — Other Ambulatory Visit (HOSPITAL_BASED_OUTPATIENT_CLINIC_OR_DEPARTMENT_OTHER): Payer: Medicare Other

## 2013-03-20 VITALS — BP 162/79 | HR 60 | Temp 96.8°F | Resp 20 | Ht 71.0 in | Wt 194.8 lb

## 2013-03-20 DIAGNOSIS — C61 Malignant neoplasm of prostate: Secondary | ICD-10-CM

## 2013-03-20 DIAGNOSIS — C7951 Secondary malignant neoplasm of bone: Secondary | ICD-10-CM

## 2013-03-20 DIAGNOSIS — C7952 Secondary malignant neoplasm of bone marrow: Secondary | ICD-10-CM

## 2013-03-20 DIAGNOSIS — E291 Testicular hypofunction: Secondary | ICD-10-CM

## 2013-03-20 DIAGNOSIS — E785 Hyperlipidemia, unspecified: Secondary | ICD-10-CM

## 2013-03-20 LAB — COMPREHENSIVE METABOLIC PANEL (CC13)
ALBUMIN: 3.5 g/dL (ref 3.5–5.0)
ALK PHOS: 91 U/L (ref 40–150)
ALT: 9 U/L (ref 0–55)
AST: 17 U/L (ref 5–34)
Anion Gap: 10 mEq/L (ref 3–11)
BUN: 44.1 mg/dL — ABNORMAL HIGH (ref 7.0–26.0)
CALCIUM: 11.1 mg/dL — AB (ref 8.4–10.4)
CO2: 31 mEq/L — ABNORMAL HIGH (ref 22–29)
Chloride: 103 mEq/L (ref 98–109)
Creatinine: 1.7 mg/dL — ABNORMAL HIGH (ref 0.7–1.3)
Glucose: 89 mg/dl (ref 70–140)
POTASSIUM: 4.9 meq/L (ref 3.5–5.1)
SODIUM: 144 meq/L (ref 136–145)
TOTAL PROTEIN: 7.4 g/dL (ref 6.4–8.3)
Total Bilirubin: 0.75 mg/dL (ref 0.20–1.20)

## 2013-03-20 LAB — CBC WITH DIFFERENTIAL/PLATELET
BASO%: 0.5 % (ref 0.0–2.0)
Basophils Absolute: 0 10*3/uL (ref 0.0–0.1)
EOS%: 4.3 % (ref 0.0–7.0)
Eosinophils Absolute: 0.4 10*3/uL (ref 0.0–0.5)
HCT: 43.6 % (ref 38.4–49.9)
HEMOGLOBIN: 14.4 g/dL (ref 13.0–17.1)
LYMPH#: 2.3 10*3/uL (ref 0.9–3.3)
LYMPH%: 26.3 % (ref 14.0–49.0)
MCH: 33.4 pg (ref 27.2–33.4)
MCHC: 33.2 g/dL (ref 32.0–36.0)
MCV: 100.7 fL — ABNORMAL HIGH (ref 79.3–98.0)
MONO#: 1 10*3/uL — ABNORMAL HIGH (ref 0.1–0.9)
MONO%: 11 % (ref 0.0–14.0)
NEUT#: 5.1 10*3/uL (ref 1.5–6.5)
NEUT%: 57.9 % (ref 39.0–75.0)
Platelets: 181 10*3/uL (ref 140–400)
RBC: 4.33 10*6/uL (ref 4.20–5.82)
RDW: 14.5 % (ref 11.0–14.6)
WBC: 8.9 10*3/uL (ref 4.0–10.3)

## 2013-03-20 LAB — PSA: PSA: 8.57 ng/mL — AB (ref <=4.00)

## 2013-03-20 MED ORDER — EZETIMIBE 10 MG PO TABS: 10.0000 mg | ORAL_TABLET | Freq: Every day | ORAL | Status: DC

## 2013-03-20 MED ORDER — LOSARTAN POTASSIUM-HCTZ 100-25 MG PO TABS: 1.0000 | ORAL_TABLET | Freq: Every day | ORAL | Status: DC

## 2013-03-20 MED ORDER — ATORVASTATIN CALCIUM 10 MG PO TABS: 10.0000 mg | ORAL_TABLET | Freq: Every day | ORAL | Status: DC

## 2013-03-20 MED ORDER — POTASSIUM CHLORIDE CRYS ER 20 MEQ PO TBCR: 20.0000 meq | EXTENDED_RELEASE_TABLET | Freq: Every day | ORAL | Status: DC

## 2013-03-20 NOTE — Progress Notes (Signed)
Hematology and Oncology Follow Up Visit  Craig [name] [id number] 07/15/1924 80 y.o. 03/20/2013 11:59 AM Craig [name], MDBrackbill, [name], MD   Principle Diagnosis: 80 year old with Castration resistant prostate cancer with metastatic disease to the bone. He was initially diagnosed in 2011 PSA of 19 and presented with advanced disease.   Prior Therapy: He is status post combined androgen deprivation with Lupron and Casodex with an excellent response initially and had a PSA nadir down to 1.67. Most recently he developed progression of disease and a PSA up to 5.94 in September of 2014.  Current therapy: He is on Xtandi started on 11/15/2012. He was started on 1000 mg initially but the dose was reduced to 500 mg in December of 2014. He continues to be on Lupron and Xgeva done at [facility].  Interim History:  80 year old gentleman presents today for a followup visit after he had been started on Xtandi in 10/2012. He tolerated the dose reduction without any complications. He reported one episode of unsteadiness that no more falls or leg edema. He has not reported any chest pain difficulty breathing. Has not reported any cough or hemoptysis or hematemesis. Does not report any nausea or vomiting or abdominal pain. He does report increase in incontinence as well. Is not reporting any back pain or neurological symptoms.  Medications: I have reviewed the patient's current medications.  Current Outpatient Prescriptions  Medication Sig Dispense Refill  . acetaminophen (TYLENOL) 500 MG tablet Take 500 mg by mouth 2 (two) times daily. Takes one in the AM and one qhs      . amLODipine (NORVASC) 2.5 MG tablet Take 1 tablet (2.5 mg total) by mouth daily.  90 tablet  1  . aspirin 81 MG chewable tablet Chew 81 mg by mouth daily.      Marland Kitchen atorvastatin (LIPITOR) 10 MG tablet Take 1 tablet (10 mg total) by mouth daily.  30 tablet  1  . B Complex-C (B-COMPLEX WITH VITAMIN C) tablet Take 1 tablet by  mouth daily.        . bisoprolol-hydrochlorothiazide (ZIAC) 10-6.25 MG per tablet Take 1 tablet by mouth daily.  90 tablet  3  . calcium carbonate (OS-CAL) 600 MG TABS Take 600 mg by mouth daily with breakfast. Taking 1200 daily      . Denosumab (XGEVA [place]) Inject into the skin every 30 (thirty) days. monthly      . Docusate Calcium (STOOL SOFTENER PO) Take 1 tablet by mouth 2 (two) times daily.       . enzalutamide (XTANDI) 40 MG capsule Take 2 capsules (80 mg total) by mouth daily.  120 capsule  0  . ezetimibe (ZETIA) 10 MG tablet Take 1 tablet (10 mg total) by mouth daily.  30 tablet  1  . furosemide (LASIX) 20 MG tablet Take 1 tablet (20 mg total) by mouth daily.  30 tablet  11  . Leuprolide Acetate (LUPRON DEPOT IM) Inject into the muscle every 3 (three) months.       Marland Kitchen losartan-hydrochlorothiazide (HYZAAR) 100-25 MG per tablet Take 1 tablet by mouth daily.  90 tablet  0  . multivitamin (THERAGRAN) per tablet Take 1 tablet by mouth daily. ( with B - Complex )      . Omega-3 Fatty Acids (FISH OIL PO) Take 1 capsule by mouth daily. ( Mega Red )      . potassium chloride SA (K-DUR,KLOR-CON) 20 MEQ tablet Take 1 tablet (20 mEq total) by mouth daily.  30 tablet  1  . solifenacin (VESICARE) 5 MG tablet Take 5 mg by mouth daily.      Marland Kitchen warfarin (COUMADIN) 5 MG tablet Take 2.5-5 mg by mouth daily. 5mg  Monday, Wednesday, Friday; 2.5mg  the rest of the week       No current facility-administered medications for this visit.     Allergies:  Allergies  Allergen Reactions  . Pravachol     Muscle weakness  . Zocor [Simvastatin - High Dose]     Muscle weakness    Past Medical History, Surgical history, Social history, and Family History were reviewed and updated.  Review of Systems:  Remaining ROS negative. Physical Exam: Blood pressure 162/79, pulse 60, temperature 96.8 F (36 C), temperature source Oral, resp. rate 20, height 5\' 11"  (1.803 m), weight 194 lb 12.8 oz (88.361 kg). ECOG:  2 General appearance: alert, cooperative and appears stated age Head: Normocephalic, without obvious abnormality, atraumatic Neck: no adenopathy, no carotid bruit, no JVD, supple, symmetrical, trachea midline and thyroid not enlarged, symmetric, no tenderness/mass/nodules Lymph nodes: Cervical, supraclavicular, and axillary nodes normal. Heart:regular rate and rhythm, S1, S2 normal, no murmur, click, rub or gallop Lung:chest clear, no wheezing, rales, normal symmetric air entry, Heart exam - S1, S2 normal, no murmur, no gallop, rate regular Abdomin: soft, non-tender, without masses or organomegaly EXT: Trace edema noted.  Lab Results: Lab Results  Component Value Date   WBC 8.9 03/20/2013   HGB 14.4 03/20/2013   HCT 43.6 03/20/2013   MCV 100.7* 03/20/2013   PLT 181 03/20/2013     Chemistry      Component Value Date/Time   NA 144 02/08/2013 1258   NA 139 02/06/2013 2304   K 3.9 02/08/2013 1258   K 3.7 02/06/2013 2304   CL 101 02/06/2013 2304   CO2 32* 02/08/2013 1258   CO2 32 02/06/2013 2304   BUN 39.5* 02/08/2013 1258   BUN 41* 02/06/2013 2304   CREATININE 1.7* 02/08/2013 1258   CREATININE 1.66* 02/06/2013 2304      Component Value Date/Time   CALCIUM 11.7* 02/08/2013 1258   CALCIUM 11.0* 02/06/2013 2304   ALKPHOS 73 02/08/2013 1258   ALKPHOS 44 04/11/2012 0951   AST 19 02/08/2013 1258   AST 20 04/11/2012 0951   ALT 9 02/08/2013 1258   ALT 14 04/11/2012 0951   BILITOT 1.45* 02/08/2013 1258   BILITOT 0.7 04/11/2012 0951      Results for Craig [name] (MRN [id number]) as of 03/20/2013 11:45  Ref. Range 01/10/2013 14:43 02/08/2013 12:58  PSA Latest Range: <=4.00 ng/mL 9.49 (H) 10.15 (H)    Impression and Plan:  80 year old gentleman with the following issues:   1. Castration resistant prostate cancer with metastatic disease to the bone. He started [place] without any major complications and has been well tolerated. His PSA dropped from 5.94 to  4.14 and only a few  weeks of therapy. His last PSA did go up to 10.15. For the last month, he has been on 500 mg instead. His PSA is pending today but it has continued to rise rapidly I will switch him to Zytiga at that time. Risks and benefits of this drug were discussed and written information were given just in case. If his PSA remains stable we will keep him on the current Xtandi dose.  2. Androgen depravation: He is to continue Lupron at this time.  3. Bone directed therapy: I've also instructed him to continue Xgeva which she has been  getting on a monthly basis.  4. Hypercalcemia: He is asymptomatic at this point and his calcium is improving with his 50% decrease his risk by mouth calcium intake.  5. Followup: Will be in 4 weeks.  [facility] MD 1/27/201511:59 AM

## 2013-03-20 NOTE — Telephone Encounter (Signed)
gv adn printed appt sched and avs for pt for March.... °

## 2013-03-21 ENCOUNTER — Telehealth: Payer: Self-pay | Admitting: *Deleted

## 2013-03-21 NOTE — Telephone Encounter (Signed)
Message copied by Randolm Idol on Wed Mar 21, 2013  3:43 PM ------      Message from: Wyatt Portela      Created: Wed Mar 21, 2013  8:35 AM       Please call his PSA and stay on Xtandi. Ok to leave a message on answering machine per his wife. ------

## 2013-03-21 NOTE — Telephone Encounter (Signed)
Spoke with wife betty. Gave results of latest PSA, per dr Alen Blew, stay on the xtandi

## 2013-03-27 ENCOUNTER — Ambulatory Visit (INDEPENDENT_AMBULATORY_CARE_PROVIDER_SITE_OTHER): Payer: Medicare Other | Admitting: *Deleted

## 2013-03-27 DIAGNOSIS — I4891 Unspecified atrial fibrillation: Secondary | ICD-10-CM

## 2013-03-27 LAB — POCT INR: INR: 2.1

## 2013-03-30 ENCOUNTER — Encounter: Payer: Self-pay | Admitting: *Deleted

## 2013-03-30 NOTE — Progress Notes (Signed)
RECEIVED A FAX FROM [facility] CONCERNING A CONFIRMATION OF PRESCRIPTION SHIPMENT FOR XTANDI ON 03/29/13.

## 2013-04-06 ENCOUNTER — Telehealth: Payer: Self-pay | Admitting: *Deleted

## 2013-04-06 NOTE — Telephone Encounter (Signed)
Wife betty calling to say patient's legs are extremely weak from the knees down. Unable to get up without help. They have been back on the xtandi @ 1/2 the dose. When they were off xtandi at [facility] [place], he did so well. Should they stop the xtandi again? Per dr Alen Blew, stop xtandi and keep regularly scheduled appt for 05/02/13. Wife verbalizes understanding.

## 2013-04-16 ENCOUNTER — Telehealth: Payer: Self-pay | Admitting: *Deleted

## 2013-04-16 NOTE — Telephone Encounter (Signed)
Wife calling to say patient has been off of xtandi for 10 days and his legs are much better, he has strength in them once again. They would like to go back on the medicine 1 more time if okay with dr Alen Blew. Yes per dr Alen Blew. Wife betty notified.

## 2013-04-24 ENCOUNTER — Ambulatory Visit (INDEPENDENT_AMBULATORY_CARE_PROVIDER_SITE_OTHER): Payer: Medicare Other | Admitting: Pharmacist

## 2013-04-24 DIAGNOSIS — I4891 Unspecified atrial fibrillation: Secondary | ICD-10-CM

## 2013-04-24 LAB — POCT INR: INR: 2.9

## 2013-04-26 ENCOUNTER — Other Ambulatory Visit: Payer: Self-pay | Admitting: Medical Oncology

## 2013-04-26 ENCOUNTER — Other Ambulatory Visit: Payer: Self-pay | Admitting: *Deleted

## 2013-04-26 ENCOUNTER — Telehealth: Payer: Self-pay | Admitting: *Deleted

## 2013-04-26 DIAGNOSIS — C61 Malignant neoplasm of prostate: Secondary | ICD-10-CM

## 2013-04-26 MED ORDER — ENZALUTAMIDE 40 MG PO CAPS: 80.0000 mg | ORAL_CAPSULE | Freq: Every day | ORAL | Status: DC

## 2013-04-26 NOTE — Telephone Encounter (Signed)
Received fax from office for [facility] today-they are not able to dispense this medication. She adds that note was on script that it was e-scribed to [facility]. Made her aware we will take care of it. Re-sent script to [facility].

## 2013-04-26 NOTE — Telephone Encounter (Signed)
Refill prescription faxed to [facility] for [facility] @ [phone]

## 2013-04-26 NOTE — Telephone Encounter (Signed)
THIS REFILL REQUEST FOR XTANDI WAS PLACED IN DR.SHADAD'S ACTIVE WORK FOLDER. 

## 2013-04-27 ENCOUNTER — Telehealth: Payer: Self-pay | Admitting: *Deleted

## 2013-04-27 NOTE — Telephone Encounter (Signed)
Wife calls to inform us that pt had spontaneous nose bleed yesterday morning and one again this AM. She has packed nose with cotton ball, I instructed ice pack PRN and apply pressure. Spouse states pt has been seen in past by ENT for these. Will observe and call ENT if bleeding continues. Today he will only take 2.5mg s instead of 5mg s and call us back for more concerns.

## 2013-05-01 ENCOUNTER — Telehealth: Payer: Self-pay | Admitting: Cardiology

## 2013-05-01 NOTE — Telephone Encounter (Signed)
New message    Patient wife calling due to her husband nose bleed last night. Also on Friday.   Went to Dr. Pixie Casino office today to get nose packed.   Wife has questions regarding coumadin levels .     Wife is aware that md / nurse are not in the office today.

## 2013-05-01 NOTE — Telephone Encounter (Signed)
New Message  Pt wife called states that the pt has had Severe Nose bleeds// Pt wife says that the pt was advised to cut the dose in half and resume later per coumadin clinic at [facility] (1 time only for Friday 04/27/2013 and resume reg dosage Sat, Sun, Mon). During the night of 04/30/2013 pt wife states that the pt had another nose bleed. (She believes that the 5.0 Mg is causing his nose to bleed)  Pt wife also states that Dr. Ernesto Rutherford has advised the pt to have his coumadin dosage lowered to allow his nose to heal. Please call and advise on how to move forward with coumadin dosage.

## 2013-05-01 NOTE — Telephone Encounter (Signed)
Will forward to coumadin clinic.

## 2013-05-01 NOTE — Telephone Encounter (Signed)
Dr Mare Ferrari please advise if ok for pt to decrease dosage of Coumadin to sub therapeutic levels to allow nose to heal as ENT recommended.  Thanks

## 2013-05-02 ENCOUNTER — Ambulatory Visit (HOSPITAL_BASED_OUTPATIENT_CLINIC_OR_DEPARTMENT_OTHER): Payer: Medicare Other | Admitting: Oncology

## 2013-05-02 ENCOUNTER — Other Ambulatory Visit (HOSPITAL_BASED_OUTPATIENT_CLINIC_OR_DEPARTMENT_OTHER): Payer: Medicare Other

## 2013-05-02 ENCOUNTER — Telehealth: Payer: Self-pay | Admitting: Oncology

## 2013-05-02 ENCOUNTER — Encounter: Payer: Self-pay | Admitting: Oncology

## 2013-05-02 VITALS — BP 121/55 | HR 72 | Temp 97.4°F | Resp 18 | Ht 71.0 in | Wt 193.3 lb

## 2013-05-02 DIAGNOSIS — C61 Malignant neoplasm of prostate: Secondary | ICD-10-CM

## 2013-05-02 DIAGNOSIS — E291 Testicular hypofunction: Secondary | ICD-10-CM

## 2013-05-02 DIAGNOSIS — C7951 Secondary malignant neoplasm of bone: Secondary | ICD-10-CM

## 2013-05-02 DIAGNOSIS — C7952 Secondary malignant neoplasm of bone marrow: Secondary | ICD-10-CM

## 2013-05-02 LAB — COMPREHENSIVE METABOLIC PANEL (CC13)
ALK PHOS: 62 U/L (ref 40–150)
ALT: 10 U/L (ref 0–55)
AST: 14 U/L (ref 5–34)
Albumin: 3.4 g/dL — ABNORMAL LOW (ref 3.5–5.0)
Anion Gap: 11 mEq/L (ref 3–11)
BUN: 43.8 mg/dL — ABNORMAL HIGH (ref 7.0–26.0)
CO2: 33 mEq/L — ABNORMAL HIGH (ref 22–29)
CREATININE: 1.8 mg/dL — AB (ref 0.7–1.3)
Calcium: 10.8 mg/dL — ABNORMAL HIGH (ref 8.4–10.4)
Chloride: 101 mEq/L (ref 98–109)
Glucose: 111 mg/dl (ref 70–140)
Potassium: 4.1 mEq/L (ref 3.5–5.1)
Sodium: 145 mEq/L (ref 136–145)
Total Bilirubin: 0.74 mg/dL (ref 0.20–1.20)
Total Protein: 7.1 g/dL (ref 6.4–8.3)

## 2013-05-02 LAB — CBC WITH DIFFERENTIAL/PLATELET
BASO%: 0.7 % (ref 0.0–2.0)
BASOS ABS: 0.1 10*3/uL (ref 0.0–0.1)
EOS%: 4.7 % (ref 0.0–7.0)
Eosinophils Absolute: 0.4 10*3/uL (ref 0.0–0.5)
HCT: 43.4 % (ref 38.4–49.9)
HEMOGLOBIN: 14.1 g/dL (ref 13.0–17.1)
LYMPH%: 27.2 % (ref 14.0–49.0)
MCH: 32.5 pg (ref 27.2–33.4)
MCHC: 32.5 g/dL (ref 32.0–36.0)
MCV: 100.2 fL — AB (ref 79.3–98.0)
MONO#: 0.7 10*3/uL (ref 0.1–0.9)
MONO%: 9.4 % (ref 0.0–14.0)
NEUT%: 58 % (ref 39.0–75.0)
NEUTROS ABS: 4.4 10*3/uL (ref 1.5–6.5)
PLATELETS: 152 10*3/uL (ref 140–400)
RBC: 4.33 10*6/uL (ref 4.20–5.82)
RDW: 14.3 % (ref 11.0–14.6)
WBC: 7.6 10*3/uL (ref 4.0–10.3)
lymph#: 2.1 10*3/uL (ref 0.9–3.3)

## 2013-05-02 LAB — PSA: PSA: 5.49 ng/mL — ABNORMAL HIGH (ref <=4.00)

## 2013-05-02 NOTE — Telephone Encounter (Signed)
LMOM for pt and spouse to take 2.5mg s today and Friday instead of 5mg s to help assist with nose bleeds per ENT and Dr Bobbye Riggs approval

## 2013-05-02 NOTE — Telephone Encounter (Signed)
Yes, okay to drop Coumadin to subtherapeutic levels to allow his nose to heal.

## 2013-05-02 NOTE — Telephone Encounter (Signed)
gv and printed appt sched and avs for pt for April  °

## 2013-05-02 NOTE — Telephone Encounter (Signed)
Pt's wife would like a detailed message left on machine as to what she should do about coumadin. Please call and advise.

## 2013-05-02 NOTE — Progress Notes (Signed)
Hematology and Oncology Follow Up Visit  Craig [name] [id number] 12-09-1924 80 y.o. 05/02/2013 3:31 PM Craig [name], MDBrackbill, Craig Moores, MD   Principle Diagnosis: 80 year old with Castration resistant prostate cancer with metastatic disease to the bone. He was initially diagnosed in 2011 PSA of 19 and presented with advanced disease.   Prior Therapy: He is status post combined androgen deprivation with Lupron and Casodex with an excellent response initially and had a PSA nadir down to 1.67. Most recently he developed progression of disease and a PSA up to 5.94 in September of 2014.  Current therapy: He is on Xtandi started on 11/15/2012. He was started on 160 mg initially but the dose was reduced to 80 mg in December of 2014. He continues to be on Lupron and Xgeva done at [facility].  Interim History:  80 year old gentleman presents today for a followup visit after he had been started on Xtandi in 10/2012. He tolerated the dose reduction without any complications initially but did develop subsequently some weakness and some dizziness which required a short interruption of his medication. His medication  was resumed  at 80 mg in the last 3 weeks without any complications so far. He reported one episode of unsteadiness that no more falls or leg edema. He has not reported any chest pain difficulty breathing. Has not reported any cough or hemoptysis or hematemesis. Does not report any nausea or vomiting or abdominal pain. He does report increase in incontinence as well. Is not reporting any back pain or neurological symptoms. He did report an episode of epistaxis and currently under evaluation for that.  Medications: I have reviewed the patient's current medications.  Current Outpatient Prescriptions  Medication Sig Dispense Refill  . acetaminophen (TYLENOL) 500 MG tablet Take 500 mg by mouth 2 (two) times daily. Takes one in the AM and one qhs      . amLODipine (NORVASC) 2.5 MG  tablet Take 1 tablet (2.5 mg total) by mouth daily.  90 tablet  1  . aspirin 81 MG chewable tablet Chew 81 mg by mouth daily.      Marland Kitchen atorvastatin (LIPITOR) 10 MG tablet Take 1 tablet (10 mg total) by mouth daily.  30 tablet  1  . B Complex-C (B-COMPLEX WITH VITAMIN C) tablet Take 1 tablet by mouth daily.        . bisoprolol-hydrochlorothiazide (ZIAC) 10-6.25 MG per tablet Take 1 tablet by mouth daily.  90 tablet  3  . calcium carbonate (OS-CAL) 600 MG TABS Take 600 mg by mouth daily with breakfast. Taking 1200 daily      . Denosumab (XGEVA [place]) Inject into the skin every 30 (thirty) days. monthly      . Docusate Calcium (STOOL SOFTENER PO) Take 1 tablet by mouth 2 (two) times daily.       . enzalutamide (XTANDI) 40 MG capsule Take 2 capsules (80 mg total) by mouth daily.  120 capsule  0  . ezetimibe (ZETIA) 10 MG tablet Take 1 tablet (10 mg total) by mouth daily.  30 tablet  1  . furosemide (LASIX) 20 MG tablet Take 1 tablet (20 mg total) by mouth daily.  30 tablet  11  . Leuprolide Acetate (LUPRON DEPOT IM) Inject into the muscle every 3 (three) months.       Marland Kitchen losartan-hydrochlorothiazide (HYZAAR) 100-25 MG per tablet Take 1 tablet by mouth daily.  90 tablet  0  . multivitamin (THERAGRAN) per tablet Take 1 tablet by mouth daily. (  with B - Complex )      . Omega-3 Fatty Acids (FISH OIL PO) Take 1 capsule by mouth daily. ( Mega Red )      . potassium chloride SA (K-DUR,KLOR-CON) 20 MEQ tablet Take 1 tablet (20 mEq total) by mouth daily.  30 tablet  1  . solifenacin (VESICARE) 5 MG tablet Take 5 mg by mouth daily.      Marland Kitchen warfarin (COUMADIN) 5 MG tablet Take 2.5-5 mg by mouth daily. 5mg  Monday, Wednesday, Friday; 2.5mg  the rest of the week       No current facility-administered medications for this visit.     Allergies:  Allergies  Allergen Reactions  . Pravachol     Muscle weakness  . Zocor [Simvastatin - High Dose]     Muscle weakness    Past Medical History, Surgical history, Social  history, and Family History were reviewed and updated.  Review of Systems:  Remaining ROS negative. Physical Exam: Blood pressure 121/55, pulse 72, temperature 97.4 F (36.3 C), temperature source Oral, resp. rate 18, height 5\' 11"  (1.803 m), weight 193 lb 4.8 oz (87.68 kg), SpO2 100.00%. ECOG: 2 General appearance: alert, cooperative and appears stated age Head: Normocephalic, without obvious abnormality, atraumatic Neck: no adenopathy, no carotid bruit, no JVD, supple, symmetrical, trachea midline and thyroid not enlarged, symmetric, no tenderness/mass/nodules Lymph nodes: Cervical, supraclavicular, and axillary nodes normal. Heart:regular rate and rhythm, S1, S2 normal, no murmur, click, rub or gallop Lung:chest clear, no wheezing, rales, normal symmetric air entry, Heart exam - S1, S2 normal, no murmur, no gallop, rate regular Abdomin: soft, non-tender, without masses or organomegaly EXT: Trace edema noted.  Lab Results: Lab Results  Component Value Date   WBC 7.6 05/02/2013   HGB 14.1 05/02/2013   HCT 43.4 05/02/2013   MCV 100.2* 05/02/2013   PLT 152 05/02/2013     Chemistry      Component Value Date/Time   NA 144 03/20/2013 1132   NA 139 02/06/2013 2304   K 4.9 03/20/2013 1132   K 3.7 02/06/2013 2304   CL 101 02/06/2013 2304   CO2 31* 03/20/2013 1132   CO2 32 02/06/2013 2304   BUN 44.1* 03/20/2013 1132   BUN 41* 02/06/2013 2304   CREATININE 1.7* 03/20/2013 1132   CREATININE 1.66* 02/06/2013 2304      Component Value Date/Time   CALCIUM 11.1* 03/20/2013 1132   CALCIUM 11.0* 02/06/2013 2304   ALKPHOS 91 03/20/2013 1132   ALKPHOS 44 04/11/2012 0951   AST 17 03/20/2013 1132   AST 20 04/11/2012 0951   ALT 9 03/20/2013 1132   ALT 14 04/11/2012 0951   BILITOT 0.75 03/20/2013 1132   BILITOT 0.7 04/11/2012 0951      Results for Craig, [name] (MRN [id number]) as of 05/02/2013 15:34  Ref. Range 02/08/2013 12:58 03/20/2013 11:32  PSA Latest Range: <=4.00 ng/mL 10.15 (H) 8.57 (H)      Impression and Plan:  80 year old gentleman with the following issues:   1. Castration resistant prostate cancer with metastatic disease to the bone. He started [place] without any major complications and has been well tolerated. His PSA dropped from 5.94 to  4.14 and only a few weeks of therapy. His last PSA did drop again to 8.57. For the last month, he has been on 80 mg. His PSA is pending today.   2. Androgen depravation: He is to continue Lupron at this time.  3. Bone directed therapy: I've also instructed him  to continue Xgeva which she has been getting on a monthly basis.  4. Hypercalcemia: He is asymptomatic at this point and his calcium is improving with his 50% decrease his risk by mouth calcium intake.  5. Followup: Will be in 4 to 6weeks.  Zola Button, MD 3/11/20153:31 PM

## 2013-05-02 NOTE — Telephone Encounter (Signed)
Will forward to Coumadin Clinic

## 2013-05-02 NOTE — Telephone Encounter (Signed)
Spoke with pt's wife.  She is aware of dosing changes and made appt to recheck INR on Monday

## 2013-05-03 ENCOUNTER — Telehealth: Payer: Self-pay | Admitting: *Deleted

## 2013-05-03 NOTE — Telephone Encounter (Signed)
Left message on answering machine ( patient identified ) calcium and PSA both are down, labs faxed to dr [email or web address] @ [facility].

## 2013-05-07 ENCOUNTER — Other Ambulatory Visit (INDEPENDENT_AMBULATORY_CARE_PROVIDER_SITE_OTHER): Payer: Medicare Other

## 2013-05-07 ENCOUNTER — Ambulatory Visit (INDEPENDENT_AMBULATORY_CARE_PROVIDER_SITE_OTHER): Payer: Medicare Other | Admitting: *Deleted

## 2013-05-07 ENCOUNTER — Encounter: Payer: Self-pay | Admitting: *Deleted

## 2013-05-07 DIAGNOSIS — E7800 Pure hypercholesterolemia, unspecified: Secondary | ICD-10-CM

## 2013-05-07 DIAGNOSIS — I4891 Unspecified atrial fibrillation: Secondary | ICD-10-CM

## 2013-05-07 DIAGNOSIS — I119 Hypertensive heart disease without heart failure: Secondary | ICD-10-CM

## 2013-05-07 LAB — HEPATIC FUNCTION PANEL
ALBUMIN: 3.4 g/dL — AB (ref 3.5–5.2)
ALT: 11 U/L (ref 0–53)
AST: 16 U/L (ref 0–37)
Alkaline Phosphatase: 49 U/L (ref 39–117)
BILIRUBIN TOTAL: 1 mg/dL (ref 0.3–1.2)
Bilirubin, Direct: 0.1 mg/dL (ref 0.0–0.3)
TOTAL PROTEIN: 6.6 g/dL (ref 6.0–8.3)

## 2013-05-07 LAB — CBC
HCT: 39.4 % (ref 39.0–52.0)
HEMOGLOBIN: 13.1 g/dL (ref 13.0–17.0)
MCHC: 33.3 g/dL (ref 30.0–36.0)
MCV: 99.5 fl (ref 78.0–100.0)
PLATELETS: 149 10*3/uL — AB (ref 150.0–400.0)
RBC: 3.96 Mil/uL — AB (ref 4.22–5.81)
RDW: 14.1 % (ref 11.5–14.6)
WBC: 7 10*3/uL (ref 4.5–10.5)

## 2013-05-07 LAB — BASIC METABOLIC PANEL
BUN: 45 mg/dL — AB (ref 6–23)
CHLORIDE: 100 meq/L (ref 96–112)
CO2: 34 meq/L — AB (ref 19–32)
CREATININE: 1.8 mg/dL — AB (ref 0.4–1.5)
Calcium: 10.3 mg/dL (ref 8.4–10.5)
GFR: 38.42 mL/min — ABNORMAL LOW (ref >60.00)
GLUCOSE: 97 mg/dL (ref 70–99)
Potassium: 3.9 mEq/L (ref 3.5–5.1)
Sodium: 140 mEq/L (ref 135–145)

## 2013-05-07 LAB — LIPID PANEL
CHOLESTEROL: 168 mg/dL (ref 0–200)
HDL: 45.5 mg/dL (ref >39.00)
LDL CALC: 89 mg/dL (ref 0–99)
TRIGLYCERIDES: 168 mg/dL — AB (ref 0.0–149.0)
Total CHOL/HDL Ratio: 4
VLDL: 33.6 mg/dL (ref 0.0–40.0)

## 2013-05-07 LAB — POCT INR: INR: 1.5

## 2013-05-07 NOTE — Progress Notes (Signed)
Quick Note:  Please make copy of labs for patient visit. ______ 

## 2013-05-07 NOTE — Progress Notes (Signed)
RECEIVED A FAX FROM [facility] CONCERNING A CONFIRMATION OF PRESCRIPTION SHIPMENT FOR XTANDI ON 05/04/13.

## 2013-05-14 ENCOUNTER — Other Ambulatory Visit: Payer: Self-pay | Admitting: *Deleted

## 2013-05-14 ENCOUNTER — Ambulatory Visit (INDEPENDENT_AMBULATORY_CARE_PROVIDER_SITE_OTHER): Payer: Medicare Other | Admitting: Cardiology

## 2013-05-14 ENCOUNTER — Encounter: Payer: Self-pay | Admitting: Cardiology

## 2013-05-14 ENCOUNTER — Ambulatory Visit (INDEPENDENT_AMBULATORY_CARE_PROVIDER_SITE_OTHER): Payer: Medicare Other | Admitting: *Deleted

## 2013-05-14 VITALS — BP 157/79 | HR 56 | Ht 71.0 in | Wt 194.0 lb

## 2013-05-14 DIAGNOSIS — Z5181 Encounter for therapeutic drug level monitoring: Secondary | ICD-10-CM | POA: Insufficient documentation

## 2013-05-14 DIAGNOSIS — G464 Cerebellar stroke syndrome: Secondary | ICD-10-CM

## 2013-05-14 DIAGNOSIS — E785 Hyperlipidemia, unspecified: Secondary | ICD-10-CM

## 2013-05-14 DIAGNOSIS — I4891 Unspecified atrial fibrillation: Secondary | ICD-10-CM

## 2013-05-14 DIAGNOSIS — I6789 Other cerebrovascular disease: Secondary | ICD-10-CM

## 2013-05-14 DIAGNOSIS — E7800 Pure hypercholesterolemia, unspecified: Secondary | ICD-10-CM

## 2013-05-14 DIAGNOSIS — I119 Hypertensive heart disease without heart failure: Secondary | ICD-10-CM

## 2013-05-14 LAB — POCT INR: INR: 2.1

## 2013-05-14 MED ORDER — EZETIMIBE 10 MG PO TABS: 10.0000 mg | ORAL_TABLET | Freq: Every day | ORAL | Status: DC

## 2013-05-14 MED ORDER — ATORVASTATIN CALCIUM 10 MG PO TABS: 10.0000 mg | ORAL_TABLET | Freq: Every day | ORAL | Status: DC

## 2013-05-14 NOTE — Patient Instructions (Signed)
NEW INR GOAL 1.8-2.2 BECAUSE OF FREQUENT NOSE BLEEDS  Your physician recommends that you continue on your current medications as directed. Please refer to the Current Medication list given to you today.  Your physician wants you to follow-up in: 4 months with fasting labs (lp/bmet/hfp/cbc)  You will receive a reminder letter in the mail two months in advance. If you don't receive a letter, please call our office to schedule the follow-up appointment.

## 2013-05-14 NOTE — Assessment & Plan Note (Signed)
Patient has a history of hypercholesterolemia and is on Lipitor.  He does not appear to be having any myalgias.  We reviewed his recent labs which are satisfactory for him.

## 2013-05-14 NOTE — Progress Notes (Signed)
Craig [name]. Date of Birth:  06/28/1924 [address] [place], 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   He has a history of essential hypertension and a previous thrombotic stroke.  Over the summer he was hospitalized for a suspected mild stroke with transient left-sided weakness. He has established atrial fibrillation and is on Coumadin. He has hypercholesterolemia and essential hypertension. He also has metastatic prostate cancer. His last visit he has been doing well. He is on injections for his bony metastases which have helped and he gets the injections once a month.  He is now under the care of Dr. Osker Mason.Marland Kitchen  He is on new medication  Xtandi.40 mg 4 tablets a day. His PSA has been declining.  He is having more overall weakness and also more incontinence.  He now has to wear depends.  He has nocturia x2.  Since last visit he has not been having any new cardiac symptoms.  Since last visit the patient has had several severe epistaxis episodes for which he had to see Dr. Ernesto Rutherford and receive nasal packing. Current Outpatient Prescriptions  Medication Sig Dispense Refill  . acetaminophen (TYLENOL) 500 MG tablet Take 500 mg by mouth 2 (two) times daily. Takes one in the AM and one qhs      . amLODipine (NORVASC) 2.5 MG tablet Take 1 tablet (2.5 mg total) by mouth daily.  90 tablet  1  . aspirin 81 MG chewable tablet Chew 81 mg by mouth daily.      Marland Kitchen atorvastatin (LIPITOR) 10 MG tablet Take 1 tablet (10 mg total) by mouth daily.  30 tablet  1  . B Complex-C (B-COMPLEX WITH VITAMIN C) tablet Take 1 tablet by mouth daily.        . bisoprolol-hydrochlorothiazide (ZIAC) 10-6.25 MG per tablet Take 1 tablet by mouth daily.  90 tablet  3  . calcium carbonate (OS-CAL) 600 MG TABS Take 600 mg by mouth daily with breakfast. Taking 1200 daily      . Denosumab (XGEVA [place])  Inject into the skin every 30 (thirty) days. monthly      . Docusate Calcium (STOOL SOFTENER PO) Take 1 tablet by mouth 2 (two) times daily.       . enzalutamide (XTANDI) 40 MG capsule Take 2 capsules (80 mg total) by mouth daily.  120 capsule  0  . ezetimibe (ZETIA) 10 MG tablet Take 1 tablet (10 mg total) by mouth daily.  30 tablet  1  . furosemide (LASIX) 20 MG tablet Take 1 tablet (20 mg total) by mouth daily.  30 tablet  11  . Leuprolide Acetate (LUPRON DEPOT IM) Inject into the muscle every 3 (three) months.       Marland Kitchen losartan-hydrochlorothiazide (HYZAAR) 100-25 MG per tablet Take 1 tablet by mouth daily.  90 tablet  0  . multivitamin (THERAGRAN) per tablet Take 1 tablet by mouth daily. ( with B - Complex )      . Omega-3 Fatty Acids (FISH OIL PO) Take 1 capsule by mouth daily. ( Mega Red )      . potassium chloride SA (K-DUR,KLOR-CON) 20 MEQ tablet Take 1 tablet (20 mEq total) by mouth daily.  30 tablet  1  . solifenacin (VESICARE) 5 MG tablet Take 5 mg by mouth daily.      Marland Kitchen warfarin (COUMADIN) 5 MG tablet Take  2.5-5 mg by mouth daily. 5mg  Monday, Wednesday, Friday; 2.5mg  the rest of the week       No current facility-administered medications for this visit.    Allergies  Allergen Reactions  . Pravachol     Muscle weakness  . Zocor [Simvastatin - High Dose]     Muscle weakness    Patient Active Problem List   Diagnosis Date Noted  . Cerebellar stroke syndrome 07/27/2010    Priority: High  . Benign hypertensive heart disease without heart failure 07/27/2010    Priority: High  . Atrial fibrillation 05/25/2010    Priority: High  . Encounter for therapeutic drug monitoring 05/14/2013  . Chronic kidney disease (CKD), stage IV (severe) 09/27/2012  . Elevated brain natriuretic peptide (BNP) level 09/27/2012  . Hypercalcemia 09/27/2012  . Weakness 09/27/2012  . Epistaxis 01/08/2011  . Hypercholesterolemia 07/27/2010  . Prostate cancer 07/27/2010  . Renal insufficiency 07/27/2010     History  Smoking status  . Former Smoker -- .5 years  . Types: Cigarettes  . Quit date: 02/22/1953  Smokeless tobacco  . Never Used    History  Alcohol Use  . Yes    Comment: 09/27/2012 "glass of wine or 2/month"    Family History  Problem Relation Age of Onset  . Heart failure Mother   . Heart attack Father     Review of Systems: The patient denies any heat or cold intolerance.  No weight gain or weight loss.  The patient denies headaches or blurry vision.  There is no cough or sputum production.  The patient denies dizziness.  There is no hematuria or hematochezia.  The patient denies any muscle aches or arthritis.  The patient denies any rash.  The patient denies frequent falling or instability.  There is no history of depression or anxiety.  All other systems were reviewed and are negative.   Physical Exam: Filed Vitals:   05/14/13 1025  BP: 157/79  Pulse: 56   General appearance reveals a well-developed elderly gentleman in no acute distress.  He walks with a walker.The head and neck exam reveals pupils equal and reactive.  Extraocular movements are full.  There is no scleral icterus.  The mouth and pharynx are normal.  The neck is supple.  The carotids reveal no bruits.  The jugular venous pressure is normal.  The  thyroid is not enlarged.  There is no lymphadenopathy.  The chest is clear to percussion and auscultation.  There are no rales or rhonchi.  Expansion of the chest is symmetrical.  The rhythm is irregularly irregular  The precordium is quiet.  The first heart sound is normal.  The second heart sound is physiologically split.  There is no murmur gallop rub or click.  There is no abnormal lift or heave.  The abdomen is soft and nontender.  The bowel sounds are normal.  The liver and spleen are not enlarged.  There are no abdominal masses.  There are no abdominal bruits.  Extremities reveal good pedal pulses.  There is mild bilateral pretibial edema.  There is no  cyanosis or clubbing.  Strength is normal and symmetrical in all extremities.  There is no lateralizing weakness.  There are no sensory deficits.  The skin is warm and dry.  There is no rash.     Assessment / Plan: Continue same medication.  Change the goal of INR down to 1.8-2.2.   Recheck in 4 months for followup office visit lipid panel hepatic function  panel basal metabolic panel and CBC.

## 2013-05-14 NOTE — Assessment & Plan Note (Signed)
The patient has not been having any chest pain or angina.  He has not been aware of any palpitations

## 2013-05-14 NOTE — Assessment & Plan Note (Signed)
Patient remains in, atrial fibrillation.  He is on long-term Coumadin.  Because of his history of falls and his history of problems with epistaxis we will adjust his INR goal downward to 1.8-2.2.

## 2013-05-14 NOTE — Assessment & Plan Note (Signed)
The patient has had no further stroke symptoms.  In December 2014 he had 2 severe falls.  The first time he had a large laceration of his scalp adjacent to his skin graft and requires suturing.  He did not go to the emergency room for the second fall but suspects that he may have had a broken rib.  On both occasions he did not have syncope.  He merely lost his balance.

## 2013-05-29 ENCOUNTER — Ambulatory Visit (INDEPENDENT_AMBULATORY_CARE_PROVIDER_SITE_OTHER): Payer: Medicare Other | Admitting: Pharmacist

## 2013-05-29 DIAGNOSIS — I4891 Unspecified atrial fibrillation: Secondary | ICD-10-CM

## 2013-05-29 DIAGNOSIS — Z5181 Encounter for therapeutic drug level monitoring: Secondary | ICD-10-CM

## 2013-05-29 LAB — POCT INR: INR: 1.7

## 2013-05-31 ENCOUNTER — Other Ambulatory Visit: Payer: Self-pay | Admitting: Medical Oncology

## 2013-05-31 ENCOUNTER — Other Ambulatory Visit: Payer: Self-pay | Admitting: *Deleted

## 2013-05-31 DIAGNOSIS — C61 Malignant neoplasm of prostate: Secondary | ICD-10-CM

## 2013-05-31 MED ORDER — ENZALUTAMIDE 40 MG PO CAPS: 80.0000 mg | ORAL_CAPSULE | Freq: Every day | ORAL | Status: DC

## 2013-05-31 NOTE — Telephone Encounter (Signed)
THIS REFILL REQUEST FOR XTANDI WAS PLACED IN DR.SHADAD'S ACTIVE WORK FOLDER. 

## 2013-05-31 NOTE — Telephone Encounter (Signed)
Prescription refill for [facility] faxed to [facility].

## 2013-06-08 NOTE — Telephone Encounter (Signed)
RECEIVED A FAX FROM [facility] CONCERNING A CONFIRMATION OF PRESCRIPTION SHIPMENT FOR XTANDI ON 06/07/13.

## 2013-06-12 ENCOUNTER — Ambulatory Visit (INDEPENDENT_AMBULATORY_CARE_PROVIDER_SITE_OTHER): Payer: Medicare Other | Admitting: Pharmacist

## 2013-06-12 ENCOUNTER — Telehealth: Payer: Self-pay | Admitting: Oncology

## 2013-06-12 ENCOUNTER — Encounter: Payer: Self-pay | Admitting: Oncology

## 2013-06-12 ENCOUNTER — Other Ambulatory Visit (HOSPITAL_BASED_OUTPATIENT_CLINIC_OR_DEPARTMENT_OTHER): Payer: Medicare Other

## 2013-06-12 ENCOUNTER — Ambulatory Visit (HOSPITAL_BASED_OUTPATIENT_CLINIC_OR_DEPARTMENT_OTHER): Payer: Medicare Other | Admitting: Oncology

## 2013-06-12 VITALS — BP 149/70 | HR 62 | Temp 96.0°F | Resp 18 | Ht 71.0 in | Wt 195.2 lb

## 2013-06-12 DIAGNOSIS — C61 Malignant neoplasm of prostate: Secondary | ICD-10-CM

## 2013-06-12 DIAGNOSIS — C7951 Secondary malignant neoplasm of bone: Secondary | ICD-10-CM

## 2013-06-12 DIAGNOSIS — I4891 Unspecified atrial fibrillation: Secondary | ICD-10-CM

## 2013-06-12 DIAGNOSIS — C7952 Secondary malignant neoplasm of bone marrow: Secondary | ICD-10-CM

## 2013-06-12 DIAGNOSIS — Z5181 Encounter for therapeutic drug level monitoring: Secondary | ICD-10-CM

## 2013-06-12 LAB — COMPREHENSIVE METABOLIC PANEL (CC13)
ALK PHOS: 57 U/L (ref 40–150)
ALT: 8 U/L (ref 0–55)
AST: 18 U/L (ref 5–34)
Albumin: 3.4 g/dL — ABNORMAL LOW (ref 3.5–5.0)
Anion Gap: 10 mEq/L (ref 3–11)
BUN: 37.8 mg/dL — AB (ref 7.0–26.0)
CO2: 30 mEq/L — ABNORMAL HIGH (ref 22–29)
CREATININE: 1.7 mg/dL — AB (ref 0.7–1.3)
Calcium: 10.8 mg/dL — ABNORMAL HIGH (ref 8.4–10.4)
Chloride: 103 mEq/L (ref 98–109)
Glucose: 98 mg/dl (ref 70–140)
Potassium: 3.9 mEq/L (ref 3.5–5.1)
Sodium: 142 mEq/L (ref 136–145)
Total Bilirubin: 0.92 mg/dL (ref 0.20–1.20)
Total Protein: 6.9 g/dL (ref 6.4–8.3)

## 2013-06-12 LAB — CBC WITH DIFFERENTIAL/PLATELET
BASO%: 0.4 % (ref 0.0–2.0)
BASOS ABS: 0 10*3/uL (ref 0.0–0.1)
EOS%: 3.4 % (ref 0.0–7.0)
Eosinophils Absolute: 0.3 10*3/uL (ref 0.0–0.5)
HEMATOCRIT: 41.5 % (ref 38.4–49.9)
HEMOGLOBIN: 13.6 g/dL (ref 13.0–17.1)
LYMPH%: 25.7 % (ref 14.0–49.0)
MCH: 32.8 pg (ref 27.2–33.4)
MCHC: 32.8 g/dL (ref 32.0–36.0)
MCV: 100 fL — AB (ref 79.3–98.0)
MONO#: 0.9 10*3/uL (ref 0.1–0.9)
MONO%: 12.2 % (ref 0.0–14.0)
NEUT#: 4.5 10*3/uL (ref 1.5–6.5)
NEUT%: 58.3 % (ref 39.0–75.0)
Platelets: 144 10*3/uL (ref 140–400)
RBC: 4.15 10*6/uL — ABNORMAL LOW (ref 4.20–5.82)
RDW: 14.3 % (ref 11.0–14.6)
WBC: 7.7 10*3/uL (ref 4.0–10.3)
lymph#: 2 10*3/uL (ref 0.9–3.3)

## 2013-06-12 LAB — POCT INR: INR: 1.9

## 2013-06-12 NOTE — Telephone Encounter (Signed)
Gave pt appt for lab and MD on May 2015 °

## 2013-06-12 NOTE — Progress Notes (Signed)
Hematology and Oncology Follow Up Visit  Craig [name] [id number] [birth year] 80 y.o. 06/12/2013 12:21 PM Craig [name], MDBrackbill, Craig Moores, MD   Principle Diagnosis: 80 year old with Castration resistant prostate cancer with metastatic disease to the bone. He was initially diagnosed in 2011 PSA of 19 and presented with advanced disease.   Prior Therapy: He is status post combined androgen deprivation with Lupron and Casodex with an excellent response initially and had a PSA nadir down to 1.67. Most recently he developed progression of disease and a PSA up to 5.94 in September of 2014.  Current therapy: He is on Xtandi started on 11/15/2012. He was started on 160 mg initially but the dose was reduced to 80 mg in December of 2014. He continues to be on Lupron and Xgeva done at [facility].  Interim History:  80 year old gentleman presents today for a followup visit after he had been started on Xtandi in 10/2012. He tolerated the dose reduction without any complications initially but did develop occasional weakness and requires short interruption of his medication. Since his last visit, he took the reduced dose of 80 mg for 56 days but his medication was stopped by his wife yesterday do to weakness. He is already feeling better after stopping the medicine. He has not reported any chest pain difficulty breathing. Has not reported any cough or hemoptysis or hematemesis. Does not report any nausea or vomiting or abdominal pain. He does report increase in incontinence as well. Is not reporting any back pain or neurological symptoms. He did report an episode of epistaxis and currently under evaluation for that.  Medications: I have reviewed the patient's current medications.  Current Outpatient Prescriptions  Medication Sig Dispense Refill  . acetaminophen (TYLENOL) 500 MG tablet Take 500 mg by mouth 2 (two) times daily. Takes one in the AM and one qhs      . amLODipine (NORVASC) 2.5 MG  tablet Take 1 tablet (2.5 mg total) by mouth daily.  90 tablet  1  . aspirin 81 MG chewable tablet Chew 81 mg by mouth daily.      Marland Kitchen atorvastatin (LIPITOR) 10 MG tablet Take 1 tablet (10 mg total) by mouth daily.  30 tablet  3  . B Complex-C (B-COMPLEX WITH VITAMIN C) tablet Take 1 tablet by mouth daily.        . bisoprolol-hydrochlorothiazide (ZIAC) 10-6.25 MG per tablet Take 1 tablet by mouth daily.  90 tablet  3  . calcium carbonate (OS-CAL) 600 MG TABS Take 600 mg by mouth daily with breakfast. Taking 1200 daily      . Denosumab (XGEVA [place]) Inject into the skin every 30 (thirty) days. monthly      . Docusate Calcium (STOOL SOFTENER PO) Take 1 tablet by mouth 2 (two) times daily.       . enzalutamide (XTANDI) 40 MG capsule Take 2 capsules (80 mg total) by mouth daily.  60 capsule  0  . ezetimibe (ZETIA) 10 MG tablet Take 1 tablet (10 mg total) by mouth daily.  30 tablet  5  . furosemide (LASIX) 20 MG tablet Take 1 tablet (20 mg total) by mouth daily.  30 tablet  11  . Leuprolide Acetate (LUPRON DEPOT IM) Inject into the muscle every 3 (three) months.       Marland Kitchen losartan-hydrochlorothiazide (HYZAAR) 100-25 MG per tablet Take 1 tablet by mouth daily.  90 tablet  0  . multivitamin (THERAGRAN) per tablet Take 1 tablet by mouth daily. (  with B - Complex )      . Omega-3 Fatty Acids (FISH OIL PO) Take 1 capsule by mouth daily. ( Mega Red )      . potassium chloride SA (K-DUR,KLOR-CON) 20 MEQ tablet Take 1 tablet (20 mEq total) by mouth daily.  30 tablet  1  . solifenacin (VESICARE) 5 MG tablet Take 5 mg by mouth daily.      Marland Kitchen warfarin (COUMADIN) 5 MG tablet Take 2.5-5 mg by mouth daily. 5mg  Monday, Wednesday, Friday; 2.5mg  the rest of the week       No current facility-administered medications for this visit.     Allergies:  Allergies  Allergen Reactions  . Pravachol     Muscle weakness  . Zocor [Simvastatin - High Dose]     Muscle weakness    Past Medical History, Surgical history, Social  history, and Family History were reviewed and updated.  Review of Systems:  Remaining ROS negative. Physical Exam: Blood pressure 149/70, pulse 62, temperature 96 F (35.6 C), temperature source Oral, resp. rate 18, height 5\' 11"  (1.803 m), weight 195 lb 3.2 oz (88.542 kg). ECOG: 2 General appearance: alert, cooperative and appears stated age Head: Normocephalic, without obvious abnormality, atraumatic Neck: no adenopathy, no carotid bruit, no JVD, supple, symmetrical, trachea midline and thyroid not enlarged, symmetric, no tenderness/mass/nodules Lymph nodes: Cervical, supraclavicular, and axillary nodes normal. Heart:regular rate and rhythm, S1, S2 normal, no murmur, click, rub or gallop Lung:chest clear, no wheezing, rales, normal symmetric air entry, Heart exam - S1, S2 normal, no murmur, no gallop, rate regular Abdomin: soft, non-tender, without masses or organomegaly EXT: Trace edema noted.  Lab Results: Lab Results  Component Value Date   WBC 7.7 06/12/2013   HGB 13.6 06/12/2013   HCT 41.5 06/12/2013   MCV 100.0* 06/12/2013   PLT 144 06/12/2013     Chemistry      Component Value Date/Time   NA 140 05/07/2013 1001   NA 145 05/02/2013 1450   K 3.9 05/07/2013 1001   K 4.1 05/02/2013 1450   CL 100 05/07/2013 1001   CO2 34* 05/07/2013 1001   CO2 33* 05/02/2013 1450   BUN 45* 05/07/2013 1001   BUN 43.8* 05/02/2013 1450   CREATININE 1.8* 05/07/2013 1001   CREATININE 1.8* 05/02/2013 1450      Component Value Date/Time   CALCIUM 10.3 05/07/2013 1001   CALCIUM 10.8* 05/02/2013 1450   ALKPHOS 49 05/07/2013 1001   ALKPHOS 62 05/02/2013 1450   AST 16 05/07/2013 1001   AST 14 05/02/2013 1450   ALT 11 05/07/2013 1001   ALT 10 05/02/2013 1450   BILITOT 1.0 05/07/2013 1001   BILITOT 0.74 05/02/2013 1450      Results for Craig [name] (MRN [id number]) as of 06/12/2013 12:23  Ref. Range 02/08/2013 12:58 03/20/2013 11:32 05/02/2013 14:50  PSA Latest Range: <=4.00 ng/mL 10.15 (H) 8.57 (H) 5.49  (H)    Impression and Plan:  80 year old gentleman with the following issues:   1. Castration resistant prostate cancer with metastatic disease to the bone. He started [place] without any major complications and has been well tolerated. His PSA dropped from 10.15 to 5.49. The plan is to continue at 80 mg daily for the time being and to hold the medication for a week if and when he develop any weakness episodes. The wife is very comfortable with these instructions she have helped his medication now for the last 24 hours and will resume it  next week at 80 mg.  2. Androgen depravation: He is to continue Lupron at this time.  3. Bone directed therapy: I've also instructed him to continue Xgeva which she has been getting on a monthly basis.  4. Hypercalcemia: This has improved in the last few weeks  5. Followup: Will be in 4 to 6 weeks.  Wyatt Portela, MD 4/21/201512:21 PM

## 2013-06-12 NOTE — Addendum Note (Signed)
Addended by: Randolm Idol on: 06/12/2013 01:39 PM   Modules accepted: Medications

## 2013-06-13 ENCOUNTER — Other Ambulatory Visit: Payer: Self-pay

## 2013-06-13 ENCOUNTER — Telehealth: Payer: Self-pay | Admitting: *Deleted

## 2013-06-13 LAB — PSA: PSA: 4.57 ng/mL — AB (ref <=4.00)

## 2013-06-13 MED ORDER — LOSARTAN POTASSIUM-HCTZ 100-25 MG PO TABS: 1.0000 | ORAL_TABLET | Freq: Every day | ORAL | Status: DC

## 2013-06-13 MED ORDER — FUROSEMIDE 20 MG PO TABS: 20.0000 mg | ORAL_TABLET | Freq: Every day | ORAL | Status: DC

## 2013-06-13 NOTE — Telephone Encounter (Signed)
Message copied by Randolm Idol on Wed Jun 13, 2013  1:39 PM ------      Message from: Wyatt Portela      Created: Wed Jun 13, 2013 12:39 PM       Please call the patient's wife with his PSA and other labs she wants. ------

## 2013-06-13 NOTE — Telephone Encounter (Signed)
Spoke with patient's wife, gave results of latest PSA. 

## 2013-06-26 ENCOUNTER — Other Ambulatory Visit: Payer: Self-pay | Admitting: *Deleted

## 2013-06-26 MED ORDER — WARFARIN SODIUM 5 MG PO TABS: ORAL_TABLET | ORAL | Status: DC

## 2013-07-02 ENCOUNTER — Ambulatory Visit (INDEPENDENT_AMBULATORY_CARE_PROVIDER_SITE_OTHER): Payer: Medicare Other | Admitting: Surgery

## 2013-07-02 DIAGNOSIS — I4891 Unspecified atrial fibrillation: Secondary | ICD-10-CM

## 2013-07-02 DIAGNOSIS — Z5181 Encounter for therapeutic drug level monitoring: Secondary | ICD-10-CM

## 2013-07-02 LAB — POCT INR: INR: 2.8

## 2013-07-04 ENCOUNTER — Other Ambulatory Visit: Payer: Self-pay | Admitting: *Deleted

## 2013-07-04 DIAGNOSIS — C61 Malignant neoplasm of prostate: Secondary | ICD-10-CM

## 2013-07-04 MED ORDER — ENZALUTAMIDE 40 MG PO CAPS: 80.0000 mg | ORAL_CAPSULE | Freq: Every day | ORAL | Status: DC

## 2013-07-04 NOTE — Telephone Encounter (Signed)
Confirmation from [facility] that [facility] script has been received and in insurance verification process.

## 2013-07-04 NOTE — Telephone Encounter (Signed)
Refill request to MD for approval. 

## 2013-07-18 ENCOUNTER — Ambulatory Visit (HOSPITAL_BASED_OUTPATIENT_CLINIC_OR_DEPARTMENT_OTHER): Payer: Medicare Other | Admitting: Oncology

## 2013-07-18 ENCOUNTER — Other Ambulatory Visit (HOSPITAL_BASED_OUTPATIENT_CLINIC_OR_DEPARTMENT_OTHER): Payer: Medicare Other

## 2013-07-18 ENCOUNTER — Ambulatory Visit (INDEPENDENT_AMBULATORY_CARE_PROVIDER_SITE_OTHER): Payer: Medicare Other | Admitting: *Deleted

## 2013-07-18 ENCOUNTER — Telehealth: Payer: Self-pay | Admitting: Oncology

## 2013-07-18 ENCOUNTER — Encounter: Payer: Self-pay | Admitting: Oncology

## 2013-07-18 VITALS — BP 140/88 | HR 58 | Temp 97.3°F | Resp 18 | Ht 71.0 in | Wt 196.2 lb

## 2013-07-18 DIAGNOSIS — C61 Malignant neoplasm of prostate: Secondary | ICD-10-CM

## 2013-07-18 DIAGNOSIS — C7952 Secondary malignant neoplasm of bone marrow: Secondary | ICD-10-CM

## 2013-07-18 DIAGNOSIS — C7951 Secondary malignant neoplasm of bone: Secondary | ICD-10-CM

## 2013-07-18 DIAGNOSIS — I4891 Unspecified atrial fibrillation: Secondary | ICD-10-CM

## 2013-07-18 DIAGNOSIS — Z5181 Encounter for therapeutic drug level monitoring: Secondary | ICD-10-CM

## 2013-07-18 LAB — CBC WITH DIFFERENTIAL/PLATELET
BASO%: 0.7 % (ref 0.0–2.0)
BASOS ABS: 0.1 10*3/uL (ref 0.0–0.1)
EOS ABS: 0.4 10*3/uL (ref 0.0–0.5)
EOS%: 4.5 % (ref 0.0–7.0)
HEMATOCRIT: 41.7 % (ref 38.4–49.9)
HEMOGLOBIN: 13.6 g/dL (ref 13.0–17.1)
LYMPH#: 2.5 10*3/uL (ref 0.9–3.3)
LYMPH%: 28.1 % (ref 14.0–49.0)
MCH: 32.6 pg (ref 27.2–33.4)
MCHC: 32.7 g/dL (ref 32.0–36.0)
MCV: 99.7 fL — ABNORMAL HIGH (ref 79.3–98.0)
MONO#: 0.9 10*3/uL (ref 0.1–0.9)
MONO%: 9.9 % (ref 0.0–14.0)
NEUT#: 5 10*3/uL (ref 1.5–6.5)
NEUT%: 56.8 % (ref 39.0–75.0)
Platelets: 151 10*3/uL (ref 140–400)
RBC: 4.18 10*6/uL — ABNORMAL LOW (ref 4.20–5.82)
RDW: 14.3 % (ref 11.0–14.6)
WBC: 8.8 10*3/uL (ref 4.0–10.3)

## 2013-07-18 LAB — COMPREHENSIVE METABOLIC PANEL (CC13)
ALBUMIN: 3.4 g/dL — AB (ref 3.5–5.0)
ALT: 7 U/L (ref 0–55)
ANION GAP: 11 meq/L (ref 3–11)
AST: 17 U/L (ref 5–34)
Alkaline Phosphatase: 54 U/L (ref 40–150)
BUN: 42.1 mg/dL — ABNORMAL HIGH (ref 7.0–26.0)
CALCIUM: 10.4 mg/dL (ref 8.4–10.4)
CHLORIDE: 103 meq/L (ref 98–109)
CO2: 30 meq/L — AB (ref 22–29)
CREATININE: 1.7 mg/dL — AB (ref 0.7–1.3)
GLUCOSE: 94 mg/dL (ref 70–140)
POTASSIUM: 4.3 meq/L (ref 3.5–5.1)
Sodium: 144 mEq/L (ref 136–145)
Total Bilirubin: 0.79 mg/dL (ref 0.20–1.20)
Total Protein: 6.8 g/dL (ref 6.4–8.3)

## 2013-07-18 LAB — POCT INR: INR: 2.8

## 2013-07-18 NOTE — Telephone Encounter (Signed)
gv wife appt schedule for july

## 2013-07-18 NOTE — Progress Notes (Signed)
Hematology and Oncology Follow Up Visit  Craig [name] [id number] Sep 23, 1924 80 y.o. 07/18/2013 2:11 PM [name], MDBrackbill, [name], MD   Principle Diagnosis: 80 year old with Castration resistant prostate cancer with metastatic disease to the bone. He was initially diagnosed in 2011 PSA of 19 and presented with advanced disease.   Prior Therapy: He is status post combined androgen deprivation with Lupron and Casodex with an excellent response initially and had a PSA nadir down to 1.67. Most recently he developed progression of disease and a PSA up to 5.94 in September of 2014.  Current therapy: He is on Xtandi started on 11/15/2012. He was started on 160 mg initially but the dose was reduced to 80 mg in December of 2014. He continues to be on Lupron and Xgeva done at [facility].  Interim History:  80 year old gentleman presents today for a followup visit with his wife. He tolerated the dose reduction of Xtandi without any complications initially but did develop occasional weakness and requires short interruption of his medication. Since his last visit, he has been on the reduced dose without actually any complications. He is not reporting any fatigue or tiredness. He does report slight lower extremity edema.  Has not reported any cough or hemoptysis or hematemesis. Does not report any nausea or vomiting or abdominal pain. He does report increase in incontinence as well. Is not reporting any back pain or neurological symptoms. He is unsteady with his mobility but has not had any recent injury or fall. Does not report any headaches or blurry vision or double vision. Is not reporting any chest pain shortness of breath or difficulty breathing. Does not report any constipation or diarrhea. Does not report any hematochezia or melena. Does not report any skin rashes or lesions. Rest of his review of system is unremarkable.  Medications: I have reviewed the patient's current  medications.  Current Outpatient Prescriptions  Medication Sig Dispense Refill  . acetaminophen (TYLENOL) 500 MG tablet Take 500 mg by mouth 2 (two) times daily. Takes one in the AM and one qhs      . amLODipine (NORVASC) 2.5 MG tablet Take 1 tablet (2.5 mg total) by mouth daily.  90 tablet  1  . aspirin 81 MG chewable tablet Chew 81 mg by mouth daily.      Marland Kitchen atorvastatin (LIPITOR) 10 MG tablet Take 1 tablet (10 mg total) by mouth daily.  30 tablet  3  . B Complex-C (B-COMPLEX WITH VITAMIN C) tablet Take 1 tablet by mouth daily.        . bisoprolol-hydrochlorothiazide (ZIAC) 10-6.25 MG per tablet Take 1 tablet by mouth daily.  90 tablet  3  . calcium carbonate (OS-CAL) 600 MG TABS Take 600 mg by mouth daily with breakfast. Taking 1200 daily      . Denosumab (XGEVA [place]) Inject into the skin every 30 (thirty) days. monthly      . Docusate Calcium (STOOL SOFTENER PO) Take 1 tablet by mouth 2 (two) times daily.       . enzalutamide (XTANDI) 40 MG capsule Take 2 capsules (80 mg total) by mouth daily.  60 capsule  0  . ezetimibe (ZETIA) 10 MG tablet Take 1 tablet (10 mg total) by mouth daily.  30 tablet  5  . furosemide (LASIX) 20 MG tablet Take 1 tablet (20 mg total) by mouth daily.  30 tablet  4  . Leuprolide Acetate (LUPRON DEPOT IM) Inject into the muscle every 3 (three) months.       Marland Kitchen  losartan-hydrochlorothiazide (HYZAAR) 100-25 MG per tablet Take 1 tablet by mouth daily.  90 tablet  0  . multivitamin (THERAGRAN) per tablet Take 1 tablet by mouth daily. ( with B - Complex )      . Omega-3 Fatty Acids (FISH OIL PO) Take 1 capsule by mouth daily. ( Mega Red )      . potassium chloride SA (K-DUR,KLOR-CON) 20 MEQ tablet Take 1 tablet (20 mEq total) by mouth daily.  30 tablet  1  . solifenacin (VESICARE) 5 MG tablet Take 5 mg by mouth daily.      Marland Kitchen warfarin (COUMADIN) 5 MG tablet 2.5mg s daily except 5mg s on Mondays, Wednesdays and Fridays or as directed by coumadin clinic  30 tablet  3   No current  facility-administered medications for this visit.     Allergies:  Allergies  Allergen Reactions  . Pravachol     Muscle weakness  . Zocor [Simvastatin - High Dose]     Muscle weakness    Past Medical History, Surgical history, Social history, and Family History were reviewed and updated.   Physical Exam: Blood pressure 140/88, pulse 58, temperature 97.3 F (36.3 C), temperature source Oral, resp. rate 18, height 5\' 11"  (1.803 m), weight 196 lb 3.2 oz (88.996 kg), SpO2 97.00%. ECOG: 2 General appearance: alert and cooperative Head: Normocephalic, without obvious abnormality Neck: no adenopathy Lymph nodes: Cervical, supraclavicular, and axillary nodes normal. Heart:regular rate and rhythm, S1, S2 normal, no murmur, click, rub or gallop Lung:chest clear, no wheezing, rales, no dullness to percussion. Abdomin: soft, non-tender, without masses or organomegaly no shifting dullness ascites. EXT: Trace edema noted.  Lab Results: Lab Results  Component Value Date   WBC 8.8 07/18/2013   HGB 13.6 07/18/2013   HCT 41.7 07/18/2013   MCV 99.7* 07/18/2013   PLT 151 07/18/2013     Chemistry      Component Value Date/Time   NA 144 07/18/2013 1327   NA 140 05/07/2013 1001   K 4.3 07/18/2013 1327   K 3.9 05/07/2013 1001   CL 100 05/07/2013 1001   CO2 30* 07/18/2013 1327   CO2 34* 05/07/2013 1001   BUN 42.1* 07/18/2013 1327   BUN 45* 05/07/2013 1001   CREATININE 1.7* 07/18/2013 1327   CREATININE 1.8* 05/07/2013 1001      Component Value Date/Time   CALCIUM 10.4 07/18/2013 1327   CALCIUM 10.3 05/07/2013 1001   ALKPHOS 54 07/18/2013 1327   ALKPHOS 49 05/07/2013 1001   AST 17 07/18/2013 1327   AST 16 05/07/2013 1001   ALT 7 07/18/2013 1327   ALT 11 05/07/2013 1001   BILITOT 0.79 07/18/2013 1327   BILITOT 1.0 05/07/2013 1001      Results for DECKER, COGDELL (MRN [id number]) as of 07/18/2013 13:51  Ref. Range 03/20/2013 11:32 05/02/2013 14:50 06/12/2013 11:45  PSA Latest Range: <=4.00 ng/mL 8.57  (H) 5.49 (H) 4.57 (H)     Impression and Plan:  80 year old gentleman with the following issues:   1. Castration resistant prostate cancer with metastatic disease to the bone. He is currently on reduced dose Xtandi with intermittent breaks and tolerating it well. His PSA dropped from 10.15 to 4.57. The plan is to continue at 80 mg daily for the time being and to hold the medication if he develops any weakness episodes. The wife is very comfortable with these instructions.   2. Androgen depravation: He is to continue Lupron at this time.  3. Bone directed therapy: I've  also instructed him to continue Xgeva which she has been getting on a monthly basis. This is given at [facility]  4. Hypercalcemia: His calcium level is normal today.  5. Followup: Will be in 4 to 6 weeks.  Wyatt Portela, MD 5/27/20152:11 PM

## 2013-07-19 ENCOUNTER — Other Ambulatory Visit: Payer: Self-pay

## 2013-07-19 ENCOUNTER — Telehealth: Payer: Self-pay | Admitting: Medical Oncology

## 2013-07-19 LAB — PSA: PSA: 4.24 ng/mL — AB (ref <=4.00)

## 2013-07-19 MED ORDER — POTASSIUM CHLORIDE CRYS ER 20 MEQ PO TBCR: 20.0000 meq | EXTENDED_RELEASE_TABLET | Freq: Every day | ORAL | Status: DC

## 2013-07-19 NOTE — Telephone Encounter (Signed)
Patient's wife requesting PSA results. Results given.

## 2013-07-20 NOTE — Telephone Encounter (Signed)
[facility] sent facsimile confirmation of Xtandi prescription shipment.  [name] was shipped on 07-19-2013 with next business day delivery.

## 2013-08-01 ENCOUNTER — Ambulatory Visit (INDEPENDENT_AMBULATORY_CARE_PROVIDER_SITE_OTHER): Payer: Medicare Other | Admitting: *Deleted

## 2013-08-01 DIAGNOSIS — I4891 Unspecified atrial fibrillation: Secondary | ICD-10-CM

## 2013-08-01 DIAGNOSIS — Z5181 Encounter for therapeutic drug level monitoring: Secondary | ICD-10-CM

## 2013-08-01 LAB — POCT INR: INR: 1.6

## 2013-08-14 ENCOUNTER — Other Ambulatory Visit: Payer: Self-pay | Admitting: *Deleted

## 2013-08-14 DIAGNOSIS — C61 Malignant neoplasm of prostate: Secondary | ICD-10-CM

## 2013-08-14 MED ORDER — ENZALUTAMIDE 40 MG PO CAPS: 80.0000 mg | ORAL_CAPSULE | Freq: Every day | ORAL | Status: DC

## 2013-08-14 NOTE — Telephone Encounter (Signed)
THIS REFILL REQUEST FOR XTANDI WAS PLACED IN DR.SHADAD'S ACTIVE WORK FOLDER. 

## 2013-08-15 ENCOUNTER — Ambulatory Visit (INDEPENDENT_AMBULATORY_CARE_PROVIDER_SITE_OTHER): Payer: Medicare Other | Admitting: *Deleted

## 2013-08-15 DIAGNOSIS — Z5181 Encounter for therapeutic drug level monitoring: Secondary | ICD-10-CM

## 2013-08-15 DIAGNOSIS — I4891 Unspecified atrial fibrillation: Secondary | ICD-10-CM

## 2013-08-15 LAB — POCT INR: INR: 1.9

## 2013-08-15 NOTE — Telephone Encounter (Signed)
Fax from [facility] that script has been received and is in insurance verification process.

## 2013-08-23 ENCOUNTER — Encounter: Payer: Self-pay | Admitting: Oncology

## 2013-08-23 ENCOUNTER — Other Ambulatory Visit (HOSPITAL_BASED_OUTPATIENT_CLINIC_OR_DEPARTMENT_OTHER): Payer: Medicare Other

## 2013-08-23 ENCOUNTER — Ambulatory Visit (HOSPITAL_BASED_OUTPATIENT_CLINIC_OR_DEPARTMENT_OTHER): Payer: Medicare Other | Admitting: Oncology

## 2013-08-23 ENCOUNTER — Telehealth: Payer: Self-pay | Admitting: Oncology

## 2013-08-23 VITALS — BP 143/72 | HR 61 | Temp 97.5°F | Resp 18 | Ht 71.0 in | Wt 193.1 lb

## 2013-08-23 DIAGNOSIS — C61 Malignant neoplasm of prostate: Secondary | ICD-10-CM

## 2013-08-23 DIAGNOSIS — C7952 Secondary malignant neoplasm of bone marrow: Secondary | ICD-10-CM

## 2013-08-23 DIAGNOSIS — C7951 Secondary malignant neoplasm of bone: Secondary | ICD-10-CM

## 2013-08-23 DIAGNOSIS — E291 Testicular hypofunction: Secondary | ICD-10-CM

## 2013-08-23 LAB — CBC WITH DIFFERENTIAL/PLATELET
BASO%: 0.5 % (ref 0.0–2.0)
Basophils Absolute: 0 10*3/uL (ref 0.0–0.1)
EOS%: 6.9 % (ref 0.0–7.0)
Eosinophils Absolute: 0.5 10*3/uL (ref 0.0–0.5)
HEMATOCRIT: 40.6 % (ref 38.4–49.9)
HEMOGLOBIN: 13.5 g/dL (ref 13.0–17.1)
LYMPH%: 26.1 % (ref 14.0–49.0)
MCH: 33 pg (ref 27.2–33.4)
MCHC: 33.1 g/dL (ref 32.0–36.0)
MCV: 99.5 fL — ABNORMAL HIGH (ref 79.3–98.0)
MONO#: 0.9 10*3/uL (ref 0.1–0.9)
MONO%: 11.5 % (ref 0.0–14.0)
NEUT#: 4.1 10*3/uL (ref 1.5–6.5)
NEUT%: 55 % (ref 39.0–75.0)
PLATELETS: 136 10*3/uL — AB (ref 140–400)
RBC: 4.08 10*6/uL — ABNORMAL LOW (ref 4.20–5.82)
RDW: 14.3 % (ref 11.0–14.6)
WBC: 7.5 10*3/uL (ref 4.0–10.3)
lymph#: 2 10*3/uL (ref 0.9–3.3)

## 2013-08-23 LAB — COMPREHENSIVE METABOLIC PANEL (CC13)
ALT: 10 U/L (ref 0–55)
ANION GAP: 10 meq/L (ref 3–11)
AST: 15 U/L (ref 5–34)
Albumin: 3.4 g/dL — ABNORMAL LOW (ref 3.5–5.0)
Alkaline Phosphatase: 53 U/L (ref 40–150)
BILIRUBIN TOTAL: 0.7 mg/dL (ref 0.20–1.20)
BUN: 49.2 mg/dL — ABNORMAL HIGH (ref 7.0–26.0)
CHLORIDE: 103 meq/L (ref 98–109)
CO2: 30 mEq/L — ABNORMAL HIGH (ref 22–29)
CREATININE: 1.7 mg/dL — AB (ref 0.7–1.3)
Calcium: 10.8 mg/dL — ABNORMAL HIGH (ref 8.4–10.4)
Glucose: 96 mg/dl (ref 70–140)
Potassium: 4.4 mEq/L (ref 3.5–5.1)
Sodium: 143 mEq/L (ref 136–145)
Total Protein: 6.8 g/dL (ref 6.4–8.3)

## 2013-08-23 NOTE — Telephone Encounter (Signed)
, °

## 2013-08-23 NOTE — Progress Notes (Signed)
Hematology and Oncology Follow Up Visit  Quantel Mcinturff [id number] 1924-04-16 80 y.o. 08/23/2013 10:46 AM [name], MDBrackbill, [name], MD   Principle Diagnosis: 80 year old with Castration resistant prostate cancer with metastatic disease to the bone. He was initially diagnosed in 2011 PSA of 19 and presented with advanced disease.   Prior Therapy: He is status post combined androgen deprivation with Lupron and Casodex with an excellent response initially and had a PSA nadir down to 1.67. Most recently he developed progression of disease and a PSA up to 5.94 in September of 2014.  Current therapy: He is on Xtandi started on 11/15/2012. He was started on 160 mg initially but the dose was reduced to 80 mg in December of 2014. He continues to be on Lupron and Xgeva done at [facility].  Interim History: [name] presents today for a followup visit with his wife. He he continues to tolerate the intermittent  Dosing ofXtandi without any complications. He is currently on 80 mg that is getting continuously but the dose is held at times by his wife due to complications. She reported that she stop the medication between 08/13/2013 to 08/20/2013 with significant improvement in his symptoms. He usually reports fatigue and tiredness which resolved after treatment break. He does report slight lower extremity edema which is stable.Marland Kitchen  Has not reported any cough or hemoptysis or hematemesis. Does not report any nausea or vomiting or abdominal pain. He does report increase in incontinence as well. Is not reporting any back pain or neurological symptoms. He is unsteady with his mobility but has not had any recent injury or fall. Does not report any headaches or blurry vision or double vision. Is not reporting any chest pain shortness of breath or difficulty breathing. Does not report any constipation or diarrhea. Does not report any hematochezia or melena. Does not report any skin rashes or  lesions. Rest of his review of system is unremarkable.  Medications: I have reviewed the patient's current medications.  Current Outpatient Prescriptions  Medication Sig Dispense Refill  . acetaminophen (TYLENOL) 500 MG tablet Take 500 mg by mouth 2 (two) times daily. Takes one in the AM and one qhs      . amLODipine (NORVASC) 2.5 MG tablet Take 1 tablet (2.5 mg total) by mouth daily.  90 tablet  1  . aspirin 81 MG chewable tablet Chew 81 mg by mouth daily.      Marland Kitchen atorvastatin (LIPITOR) 10 MG tablet Take 1 tablet (10 mg total) by mouth daily.  30 tablet  3  . B Complex-C (B-COMPLEX WITH VITAMIN C) tablet Take 1 tablet by mouth daily.        . bisoprolol-hydrochlorothiazide (ZIAC) 10-6.25 MG per tablet Take 1 tablet by mouth daily.  90 tablet  3  . calcium carbonate (OS-CAL) 600 MG TABS Take 600 mg by mouth daily with breakfast. Taking 1200 daily      . Denosumab (XGEVA ) Inject into the skin every 30 (thirty) days. monthly      . Docusate Calcium (STOOL SOFTENER PO) Take 1 tablet by mouth 2 (two) times daily.       . enzalutamide (XTANDI) 40 MG capsule Take 2 capsules (80 mg total) by mouth daily.  60 capsule  0  . ezetimibe (ZETIA) 10 MG tablet Take 1 tablet (10 mg total) by mouth daily.  30 tablet  5  . furosemide (LASIX) 20 MG tablet Take 1 tablet (20 mg total) by mouth daily.  30 tablet  4  . Leuprolide Acetate (LUPRON DEPOT IM) Inject into the muscle every 3 (three) months.       Marland Kitchen losartan-hydrochlorothiazide (HYZAAR) 100-25 MG per tablet Take 1 tablet by mouth daily.  90 tablet  0  . multivitamin (THERAGRAN) per tablet Take 1 tablet by mouth daily. ( with B - Complex )      . Omega-3 Fatty Acids (FISH OIL PO) Take 1 capsule by mouth daily. ( Mega Red )      . potassium chloride SA (K-DUR,KLOR-CON) 20 MEQ tablet Take 1 tablet (20 mEq total) by mouth daily.  30 tablet  6  . solifenacin (VESICARE) 5 MG tablet Take 5 mg by mouth daily.      Marland Kitchen warfarin (COUMADIN) 5 MG tablet 2.5mg s daily  except 5mg s on Mondays, Wednesdays and Fridays or as directed by coumadin clinic  30 tablet  3   No current facility-administered medications for this visit.     Allergies:  Allergies  Allergen Reactions  . Pravachol     Muscle weakness  . Zocor [Simvastatin - High Dose]     Muscle weakness    Past Medical History, Surgical history, Social history, and Family History were reviewed and updated.   Physical Exam: Blood pressure 143/72, pulse 61, temperature 97.5 F (36.4 C), temperature source Oral, resp. rate 18, height 5\' 11"  (1.803 m), weight 193 lb 1.6 oz (87.59 kg). ECOG: 2 General appearance: alert and cooperative Head: Normocephalic, without obvious abnormality Neck: no adenopathy Lymph nodes: Cervical, supraclavicular, and axillary nodes normal. Heart:regular rate and rhythm, S1, S2 normal, no murmur, click, rub or gallop Lung:chest clear, no wheezing, rales, no dullness to percussion. Abdomin: soft, non-tender, without masses or organomegaly no shifting dullness ascites. EXT: Trace edema noted.  Lab Results: Lab Results  Component Value Date   WBC 7.5 08/23/2013   HGB 13.5 08/23/2013   HCT 40.6 08/23/2013   MCV 99.5* 08/23/2013   PLT 136* 08/23/2013     Chemistry      Component Value Date/Time   NA 144 07/18/2013 1327   NA 140 05/07/2013 1001   K 4.3 07/18/2013 1327   K 3.9 05/07/2013 1001   CL 100 05/07/2013 1001   CO2 30* 07/18/2013 1327   CO2 34* 05/07/2013 1001   BUN 42.1* 07/18/2013 1327   BUN 45* 05/07/2013 1001   CREATININE 1.7* 07/18/2013 1327   CREATININE 1.8* 05/07/2013 1001      Component Value Date/Time   CALCIUM 10.4 07/18/2013 1327   CALCIUM 10.3 05/07/2013 1001   ALKPHOS 54 07/18/2013 1327   ALKPHOS 49 05/07/2013 1001   AST 17 07/18/2013 1327   AST 16 05/07/2013 1001   ALT 7 07/18/2013 1327   ALT 11 05/07/2013 1001   BILITOT 0.79 07/18/2013 1327   BILITOT 1.0 05/07/2013 1001       Results for BILAL, MANZER (MRN [id number]) as of 08/23/2013 10:48  Ref.  Range 05/02/2013 14:50 06/12/2013 11:45 07/18/2013 13:27  PSA Latest Range: <=4.00 ng/mL 5.49 (H) 4.57 (H) 4.24 (H)     Impression and Plan:  80 year old gentleman with the following issues:   1. Castration resistant prostate cancer with metastatic disease to the bone. He is currently on reduced dose Xtandi with intermittent breaks and tolerating it well. His PSA dropped from 10.15 to 4.24. The plan is to continue at 80 mg daily for the time being and to hold the medication if he develops any weakness episodes. The wife  is managing these breaks properly and his quality of life continues to be reasonable at this time.  2. Androgen depravation: He is to continue Lupron at this time.  3. Bone directed therapy: I've also instructed him to continue Xgeva which she has been getting on a monthly basis. This is given at [facility]  4. Hypercalcemia: His calcium level is pending from today.   5. Followup: Will be in 6 weeks.  [facility], MD 7/2/201510:46 AM

## 2013-08-24 LAB — PSA: PSA: 4.32 ng/mL — ABNORMAL HIGH (ref <=4.00)

## 2013-08-27 ENCOUNTER — Telehealth: Payer: Self-pay | Admitting: *Deleted

## 2013-08-27 NOTE — Telephone Encounter (Signed)
Message copied by Randolm Idol on Mon Aug 27, 2013  2:00 PM ------      Message from: Wyatt Portela      Created: Mon Aug 27, 2013  8:54 AM       Please call his PSA. Stable. ------

## 2013-09-05 ENCOUNTER — Ambulatory Visit (INDEPENDENT_AMBULATORY_CARE_PROVIDER_SITE_OTHER): Payer: Medicare Other | Admitting: *Deleted

## 2013-09-05 DIAGNOSIS — I4891 Unspecified atrial fibrillation: Secondary | ICD-10-CM

## 2013-09-05 DIAGNOSIS — Z5181 Encounter for therapeutic drug level monitoring: Secondary | ICD-10-CM

## 2013-09-05 LAB — POCT INR: INR: 1.8

## 2013-09-10 ENCOUNTER — Other Ambulatory Visit (INDEPENDENT_AMBULATORY_CARE_PROVIDER_SITE_OTHER): Payer: Medicare Other

## 2013-09-10 DIAGNOSIS — I4891 Unspecified atrial fibrillation: Secondary | ICD-10-CM

## 2013-09-10 DIAGNOSIS — E7800 Pure hypercholesterolemia, unspecified: Secondary | ICD-10-CM

## 2013-09-10 LAB — BASIC METABOLIC PANEL
BUN: 43 mg/dL — ABNORMAL HIGH (ref 6–23)
CALCIUM: 11 mg/dL — AB (ref 8.4–10.5)
CO2: 30 mEq/L (ref 19–32)
Chloride: 102 mEq/L (ref 96–112)
Creatinine, Ser: 1.7 mg/dL — ABNORMAL HIGH (ref 0.4–1.5)
GFR: 41.88 mL/min — AB (ref >60.00)
GLUCOSE: 90 mg/dL (ref 70–99)
Potassium: 4.1 mEq/L (ref 3.5–5.1)
Sodium: 142 mEq/L (ref 135–145)

## 2013-09-10 LAB — CBC WITH DIFFERENTIAL/PLATELET
BASOS ABS: 0 10*3/uL (ref 0.0–0.1)
Basophils Relative: 0.3 % (ref 0.0–3.0)
EOS PCT: 5.1 % — AB (ref 0.0–5.0)
Eosinophils Absolute: 0.5 10*3/uL (ref 0.0–0.7)
HEMATOCRIT: 43.9 % (ref 39.0–52.0)
Hemoglobin: 14.4 g/dL (ref 13.0–17.0)
LYMPHS ABS: 2.9 10*3/uL (ref 0.7–4.0)
Lymphocytes Relative: 30.2 % (ref 12.0–46.0)
MCHC: 32.8 g/dL (ref 30.0–36.0)
MCV: 100.6 fl — ABNORMAL HIGH (ref 78.0–100.0)
MONO ABS: 1.1 10*3/uL — AB (ref 0.1–1.0)
Monocytes Relative: 11.4 % (ref 3.0–12.0)
Neutro Abs: 5.2 10*3/uL (ref 1.4–7.7)
Neutrophils Relative %: 53 % (ref 43.0–77.0)
PLATELETS: 173 10*3/uL (ref 150.0–400.0)
RBC: 4.37 Mil/uL (ref 4.22–5.81)
RDW: 14.7 % (ref 11.5–15.5)
WBC: 9.7 10*3/uL (ref 4.0–10.5)

## 2013-09-10 LAB — HEPATIC FUNCTION PANEL
ALT: 10 U/L (ref 0–53)
AST: 18 U/L (ref 0–37)
Albumin: 3.6 g/dL (ref 3.5–5.2)
Alkaline Phosphatase: 52 U/L (ref 39–117)
BILIRUBIN DIRECT: 0.1 mg/dL (ref 0.0–0.3)
BILIRUBIN TOTAL: 1.2 mg/dL (ref 0.2–1.2)
Total Protein: 7.1 g/dL (ref 6.0–8.3)

## 2013-09-10 LAB — LIPID PANEL
CHOLESTEROL: 173 mg/dL (ref 0–200)
HDL: 45.4 mg/dL (ref >39.00)
LDL Cholesterol: 95 mg/dL (ref 0–99)
NonHDL: 127.6
Total CHOL/HDL Ratio: 4
Triglycerides: 165 mg/dL — ABNORMAL HIGH (ref 0.0–149.0)
VLDL: 33 mg/dL (ref 0.0–40.0)

## 2013-09-10 NOTE — Progress Notes (Signed)
Quick Note:  Please make copy of labs for patient visit. ______ 

## 2013-09-11 ENCOUNTER — Other Ambulatory Visit: Payer: Self-pay

## 2013-09-11 MED ORDER — LOSARTAN POTASSIUM-HCTZ 100-25 MG PO TABS: 1.0000 | ORAL_TABLET | Freq: Every day | ORAL | Status: DC

## 2013-09-12 ENCOUNTER — Encounter: Payer: Self-pay | Admitting: Cardiology

## 2013-09-12 ENCOUNTER — Ambulatory Visit (INDEPENDENT_AMBULATORY_CARE_PROVIDER_SITE_OTHER): Payer: Medicare Other | Admitting: Cardiology

## 2013-09-12 VITALS — BP 118/56 | HR 64 | Ht 71.0 in | Wt 188.0 lb

## 2013-09-12 DIAGNOSIS — E785 Hyperlipidemia, unspecified: Secondary | ICD-10-CM

## 2013-09-12 DIAGNOSIS — E7800 Pure hypercholesterolemia, unspecified: Secondary | ICD-10-CM

## 2013-09-12 DIAGNOSIS — I482 Chronic atrial fibrillation, unspecified: Secondary | ICD-10-CM

## 2013-09-12 DIAGNOSIS — I4891 Unspecified atrial fibrillation: Secondary | ICD-10-CM

## 2013-09-12 DIAGNOSIS — I119 Hypertensive heart disease without heart failure: Secondary | ICD-10-CM

## 2013-09-12 DIAGNOSIS — Z79899 Other long term (current) drug therapy: Secondary | ICD-10-CM

## 2013-09-12 MED ORDER — ATORVASTATIN CALCIUM 10 MG PO TABS: 10.0000 mg | ORAL_TABLET | Freq: Every day | ORAL | Status: DC

## 2013-09-12 NOTE — Assessment & Plan Note (Signed)
His blood pressure has been more stable on current therapy.  No chest pain or angina.  No symptoms of CHF.  His peripheral edema has improved.

## 2013-09-12 NOTE — Assessment & Plan Note (Signed)
He remains in permanent atrial fibrillation.  He has not had any TIA or stroke symptoms since last visit.  He has had progressive weakness possibly related to his chemotherapy for his prostate cancer

## 2013-09-12 NOTE — Assessment & Plan Note (Signed)
We reviewed his lipid studies which are satisfactory.  He has not had any myalgias from his 10 mg Lipitor.

## 2013-09-12 NOTE — Progress Notes (Signed)
Craig [name]. 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 He has a history of essential hypertension and a previous thrombotic stroke. Over the summer 2014 he was hospitalized for a suspected mild stroke with transient left-sided weakness. He has established atrial fibrillation and is on Coumadin. He has hypercholesterolemia and essential hypertension. He also has metastatic prostate cancer. His last visit he has been doing well. He is on injections for his bony metastases which have helped and he gets the injections once a month. He is now under the care of Dr. Osker Mason.Marland Kitchen He is on new medication Xtandi.40 mg 4 tablets a day. His PSA has been declining. He is having more overall weakness and also more incontinence. He now has to wear depends. He has nocturia x2. Since last visit he has not been having any new cardiac symptoms.  He has a past history of epistaxis which has improved since we cut back on the dosing of his warfarin to a lower: INR 1.8 up to 2.2.   Current Outpatient Prescriptions  Medication Sig Dispense Refill  . acetaminophen (TYLENOL) 500 MG tablet Take 500 mg by mouth 2 (two) times daily. Takes one in the AM and one qhs      . amLODipine (NORVASC) 2.5 MG tablet Take 1 tablet (2.5 mg total) by mouth daily.  90 tablet  1  . aspirin 81 MG chewable tablet Chew 81 mg by mouth daily.      Marland Kitchen atorvastatin (LIPITOR) 10 MG tablet Take 1 tablet (10 mg total) by mouth daily.  90 tablet  11  . B Complex-C (B-COMPLEX WITH VITAMIN C) tablet Take 1 tablet by mouth daily.        . bisoprolol-hydrochlorothiazide (ZIAC) 10-6.25 MG per tablet Take 1 tablet by mouth daily.  90 tablet  3  . calcium carbonate (OS-CAL) 600 MG TABS Take 600 mg by mouth daily with breakfast.  Taking 1200 daily      . Denosumab (XGEVA [place]) Inject into the skin every 30 (thirty) days. monthly      . Docusate Calcium (STOOL SOFTENER PO) Take 1 tablet by mouth 2 (two) times daily.       . enzalutamide (XTANDI) 40 MG capsule Take 2 capsules (80 mg total) by mouth daily.  60 capsule  0  . ezetimibe (ZETIA) 10 MG tablet Take 1 tablet (10 mg total) by mouth daily.  30 tablet  5  . furosemide (LASIX) 20 MG tablet Take 1 tablet (20 mg total) by mouth daily.  30 tablet  4  . Leuprolide Acetate (LUPRON DEPOT IM) Inject into the muscle every 3 (three) months.       Marland Kitchen losartan-hydrochlorothiazide (HYZAAR) 100-25 MG per tablet Take 1 tablet by mouth daily.  90 tablet  1  . multivitamin (THERAGRAN) per tablet Take 1 tablet by mouth daily. ( with B - Complex )      . Omega-3 Fatty Acids (FISH OIL PO) Take 1 capsule by mouth daily. ( Mega Red )      . potassium chloride SA (K-DUR,KLOR-CON) 20 MEQ tablet Take 1 tablet (20 mEq total) by mouth daily.  30 tablet  6  . solifenacin (VESICARE) 5 MG tablet Take 5 mg by mouth daily.      Marland Kitchen  warfarin (COUMADIN) 5 MG tablet 2.5mg s daily except 5mg s on Mondays, Wednesdays and Fridays or as directed by coumadin clinic  30 tablet  3   No current facility-administered medications for this visit.    Allergies  Allergen Reactions  . Pravachol     Muscle weakness  . Zocor [Simvastatin - High Dose]     Muscle weakness    Patient Active Problem List   Diagnosis Date Noted  . Cerebellar stroke syndrome 07/27/2010    Priority: High  . Benign hypertensive heart disease without heart failure 07/27/2010    Priority: High  . Atrial fibrillation 05/25/2010    Priority: High  . Encounter for therapeutic drug monitoring 05/14/2013  . Chronic kidney disease (CKD), stage IV (severe) 09/27/2012  . Elevated brain natriuretic peptide (BNP) level 09/27/2012  . Hypercalcemia 09/27/2012  . Weakness 09/27/2012  . Epistaxis 01/08/2011  . Hypercholesterolemia 07/27/2010  .  Prostate cancer 07/27/2010  . Renal insufficiency 07/27/2010    History  Smoking status  . Former Smoker -- .5 years  . Types: Cigarettes  . Quit date: 02/22/1953  Smokeless tobacco  . Never Used    History  Alcohol Use  . Yes    Comment: 09/27/2012 "glass of wine or 2/month"    Family History  Problem Relation Age of Onset  . Heart failure Mother   . Heart attack Father     Review of Systems: Constitutional: no fever chills diaphoresis or fatigue or change in weight.  Head and neck: no hearing loss, no epistaxis, no photophobia or visual disturbance. Respiratory: No cough, shortness of breath or wheezing. Cardiovascular: No chest pain peripheral edema, palpitations. Gastrointestinal: No abdominal distention, no abdominal pain, no change in bowel habits hematochezia or melena. Genitourinary: No dysuria, no frequency, no urgency, no nocturia. Musculoskeletal:No arthralgias, no back pain, no gait disturbance or myalgias. Neurological: No dizziness, no headaches, no numbness, no seizures, no syncope, no weakness, no tremors. Hematologic: No lymphadenopathy, no easy bruising. Psychiatric: No confusion, no hallucinations, no sleep disturbance.    Physical Exam: Filed Vitals:   09/12/13 1539  BP: 118/56  Pulse: 64   the general appearance reveals an elderly gentleman who arrives in a wheelchair.The head and neck exam reveals pupils equal and reactive.  Extraocular movements are full.  There is no scleral icterus.  The mouth and pharynx are normal.  The neck is supple.  The carotids reveal no bruits.  The jugular venous pressure is normal.  The  thyroid is not enlarged.  There is no lymphadenopathy.  The chest is clear to percussion and auscultation.  There are no rales or rhonchi.  Expansion of the chest is symmetrical.  The precordium is quiet.  The heart rhythm is irregularly irregular. The first heart sound is normal.  The second heart sound is physiologically split.  There is  no murmur gallop rub or click.  There is no abnormal lift or heave.  The abdomen is soft and nontender.  The bowel sounds are normal.  The liver and spleen are not enlarged.  There are no abdominal masses.  There are no abdominal bruits.  Extremities reveal good pedal pulses.  There is mild pretibial and ankle edema worse on the left.  There is no cyanosis or clubbing.  Strength is normal and symmetrical in all extremities.  There is no lateralizing weakness.  There are no sensory deficits.  The skin is warm and dry.  There is no rash.     Assessment / Plan:  1. permanent atrial fibrillation on Coumadin 2. essential hypertension without heart failure 3. Hyperlipidemia 4. old stroke 5. prostate cancer metastatic to bone  Disposition continue on same medication.  Recheck in 4 months for office visit CBC lipid panel hepatic function panel and basal metabolic panel.

## 2013-09-12 NOTE — Patient Instructions (Signed)
Your physician recommends that you continue on your current medications as directed. Please refer to the Current Medication list given to you today.  Your physician recommends that you schedule a follow-up appointment in: 4 months with fasting labs (lp/bmet/hfp/cbc)

## 2013-09-17 ENCOUNTER — Telehealth: Payer: Self-pay | Admitting: Medical Oncology

## 2013-09-17 NOTE — Telephone Encounter (Signed)
Wife called to inform Dr. Alen Blew that patient had been experiencing an increased amount of general weakness with difficulty standing and ambulating, d/t the patient's weakness she has held the [place] for 10 days and asking Dr. Hazeline Junker opinion about holding it for another week (to this Friday) to see if he recovers more. States since patient has not had the Xtandi for 10 days his weakness has improved and "walking fairly well with walker" but also states he "has never been this weak."  Patient on Xtandi, 80 mg (2 Tablets) daily.  LOV 08/23/2013 F/U  10/03/2013  mssg forwarded to MD for review.

## 2013-09-17 NOTE — Telephone Encounter (Signed)
Per MD, informed wife ok to hold medication till patient's symptoms improve, regardless of time. Wife expressed understanding and relief. States she will call office when pt's symptoms have improved to see if Dr. Alen Blew wishes for patient to resume with 2 tablets daily or a reduction to 1 tablet daily. Denies further questions at this time.

## 2013-09-17 NOTE — Telephone Encounter (Signed)
I agree with holding the medication till his symptoms improve. Regardless of how long it takes.

## 2013-09-19 ENCOUNTER — Other Ambulatory Visit: Payer: Self-pay | Admitting: *Deleted

## 2013-09-19 DIAGNOSIS — C61 Malignant neoplasm of prostate: Secondary | ICD-10-CM

## 2013-09-19 MED ORDER — ENZALUTAMIDE 40 MG PO CAPS: 80.0000 mg | ORAL_CAPSULE | Freq: Every day | ORAL | Status: DC

## 2013-09-19 NOTE — Addendum Note (Signed)
Addended by: Wyonia Hough on: 09/19/2013 04:46 PM   Modules accepted: Orders

## 2013-09-19 NOTE — Telephone Encounter (Signed)
THIS REFILL REQUEST FOR XTANDI WAS PLACED IN DR.SHADAD'S ACTIVE WORK FOLDER. 

## 2013-09-20 ENCOUNTER — Telehealth: Payer: Self-pay | Admitting: *Deleted

## 2013-09-20 NOTE — Telephone Encounter (Signed)
Received fax from [employer] stating that referral received & will be in touch once insurance verified & delivery arrangements made with pt.

## 2013-09-21 ENCOUNTER — Telehealth: Payer: Self-pay | Admitting: Medical Oncology

## 2013-09-21 NOTE — Telephone Encounter (Signed)
LVMOM with wife that Dr. Alen Blew wants patient to resume Xtandi @ 80 mg daily.   Patient/Spouse to call office/clinic with any questions/concerns.  F/U appt 08/12 lab/MD

## 2013-09-21 NOTE — Telephone Encounter (Signed)
VMOM: Patient's wife called stating patient off of [name] now for 2 weeks and his symptoms have improved, "he is better and has regained his strength."  Wife asking when to restart Xtandi? Will there be a dose reduction? Pt currently on 2 tablets (80 mg) daily.  Will review with MD and call her back with info.

## 2013-09-21 NOTE — Telephone Encounter (Signed)
Resume at 80 mg daily.

## 2013-09-24 ENCOUNTER — Other Ambulatory Visit: Payer: Self-pay

## 2013-09-24 MED ORDER — AMLODIPINE BESYLATE 2.5 MG PO TABS: 2.5000 mg | ORAL_TABLET | Freq: Every day | ORAL | Status: DC

## 2013-10-03 ENCOUNTER — Ambulatory Visit (HOSPITAL_BASED_OUTPATIENT_CLINIC_OR_DEPARTMENT_OTHER): Payer: Medicare Other | Admitting: Oncology

## 2013-10-03 ENCOUNTER — Telehealth: Payer: Self-pay | Admitting: Oncology

## 2013-10-03 ENCOUNTER — Encounter: Payer: Self-pay | Admitting: Oncology

## 2013-10-03 ENCOUNTER — Other Ambulatory Visit (HOSPITAL_BASED_OUTPATIENT_CLINIC_OR_DEPARTMENT_OTHER): Payer: Medicare Other

## 2013-10-03 VITALS — BP 123/60 | HR 57 | Temp 97.8°F | Resp 18 | Ht 71.0 in | Wt 192.4 lb

## 2013-10-03 DIAGNOSIS — C61 Malignant neoplasm of prostate: Secondary | ICD-10-CM

## 2013-10-03 DIAGNOSIS — C7951 Secondary malignant neoplasm of bone: Secondary | ICD-10-CM

## 2013-10-03 DIAGNOSIS — E291 Testicular hypofunction: Secondary | ICD-10-CM

## 2013-10-03 DIAGNOSIS — C7952 Secondary malignant neoplasm of bone marrow: Secondary | ICD-10-CM

## 2013-10-03 LAB — CBC WITH DIFFERENTIAL/PLATELET
BASO%: 0.5 % (ref 0.0–2.0)
Basophils Absolute: 0 10*3/uL (ref 0.0–0.1)
EOS%: 6.4 % (ref 0.0–7.0)
Eosinophils Absolute: 0.5 10*3/uL (ref 0.0–0.5)
HCT: 43.3 % (ref 38.4–49.9)
HGB: 14 g/dL (ref 13.0–17.1)
LYMPH%: 27.9 % (ref 14.0–49.0)
MCH: 32.2 pg (ref 27.2–33.4)
MCHC: 32.3 g/dL (ref 32.0–36.0)
MCV: 99.5 fL — ABNORMAL HIGH (ref 79.3–98.0)
MONO#: 0.8 10*3/uL (ref 0.1–0.9)
MONO%: 9.9 % (ref 0.0–14.0)
NEUT#: 4.5 10*3/uL (ref 1.5–6.5)
NEUT%: 55.3 % (ref 39.0–75.0)
PLATELETS: 153 10*3/uL (ref 140–400)
RBC: 4.35 10*6/uL (ref 4.20–5.82)
RDW: 14.4 % (ref 11.0–14.6)
WBC: 8.2 10*3/uL (ref 4.0–10.3)
lymph#: 2.3 10*3/uL (ref 0.9–3.3)

## 2013-10-03 LAB — COMPREHENSIVE METABOLIC PANEL
ALBUMIN: 3.4 g/dL — AB (ref 3.5–5.2)
ALK PHOS: 61 U/L (ref 39–117)
ALT: 9 U/L (ref 0–53)
AST: 15 U/L (ref 0–37)
BUN: 54 mg/dL — ABNORMAL HIGH (ref 6–23)
CALCIUM: 10.6 mg/dL — AB (ref 8.4–10.5)
CHLORIDE: 100 meq/L (ref 96–112)
CO2: 32 meq/L (ref 19–32)
Creatinine, Ser: 1.86 mg/dL — ABNORMAL HIGH (ref 0.50–1.35)
GLUCOSE: 95 mg/dL (ref 70–99)
POTASSIUM: 4.4 meq/L (ref 3.5–5.3)
Sodium: 144 mEq/L (ref 135–145)
TOTAL PROTEIN: 7 g/dL (ref 6.0–8.3)
Total Bilirubin: 0.5 mg/dL (ref 0.2–1.2)

## 2013-10-03 NOTE — Progress Notes (Signed)
Hematology and Oncology Follow Up Visit  Craig [name] [id number] May 26, 1924 80 y.o. 10/03/2013 4:27 PM Craig [name]   Principle Diagnosis: 80 year old with Castration resistant prostate cancer with metastatic disease to the bone. He was initially diagnosed in 2011 PSA of 19 and presented with advanced disease.   Prior Therapy: He is status post combined androgen deprivation with Lupron and Casodex with an excellent response initially and had a PSA nadir down to 1.67. Most recently he developed progression of disease and a PSA up to 5.94 in September of 2014.  Current therapy: He is on Xtandi started on 11/15/2012. He was started on 160 mg initially but the dose was reduced to 80 mg in December of 2014. He continues to be on Lupron and Xgeva done at [facility].  Interim History: [name] presents today for a followup visit with his wife. He he continues to tolerate the intermittent dosing of Xtandi without any complications. He is currently on 80 mg for the last 10 days or so.The medication was held at times by his wife due to complications mostly generalized weakness. She had to hold it now for more than 2 weeks and felt better after that. He usually reports fatigue and tiredness which resolved after treatment break. He does report slight lower extremity edema which is stable.Has not reported any cough or hemoptysis or hematemesis. Does not report any nausea or vomiting or abdominal pain. He does report increase in incontinence as well. Is not reporting any back pain or neurological symptoms. He is unsteady with his mobility but has not had any recent injury or fall. Does not report any headaches or blurry vision or double vision. Is not reporting any chest pain shortness of breath or difficulty breathing. Does not report any constipation or diarrhea. Does not report any hematochezia or melena. Does not report any skin rashes or lesions. Rest of his  review of system is unremarkable.  Medications: I have reviewed the patient's current medications.  Current Outpatient Prescriptions  Medication Sig Dispense Refill  . acetaminophen (TYLENOL) 500 MG tablet Take 500 mg by mouth 2 (two) times daily. Takes one in the AM and one qhs      . amLODipine (NORVASC) 2.5 MG tablet Take 1 tablet (2.5 mg total) by mouth daily.  90 tablet  1  . aspirin 81 MG chewable tablet Chew 81 mg by mouth daily.      Marland Kitchen atorvastatin (LIPITOR) 10 MG tablet Take 1 tablet (10 mg total) by mouth daily.  90 tablet  11  . B Complex-C (B-COMPLEX WITH VITAMIN C) tablet Take 1 tablet by mouth daily.        . bisoprolol-hydrochlorothiazide (ZIAC) 10-6.25 MG per tablet Take 1 tablet by mouth daily.  90 tablet  3  . Denosumab (XGEVA [place]) Inject into the skin every 30 (thirty) days. monthly      . Docusate Calcium (STOOL SOFTENER PO) Take 1 tablet by mouth 2 (two) times daily.       . enzalutamide (XTANDI) 40 MG capsule Take 2 capsules (80 mg total) by mouth daily.  60 capsule  0  . ezetimibe (ZETIA) 10 MG tablet Take 1 tablet (10 mg total) by mouth daily.  30 tablet  5  . furosemide (LASIX) 20 MG tablet Take 1 tablet (20 mg total) by mouth daily.  30 tablet  4  . Leuprolide Acetate (LUPRON DEPOT IM) Inject into the muscle every 3 (three) months.       Marland Kitchen  losartan-hydrochlorothiazide (HYZAAR) 100-25 MG per tablet Take 1 tablet by mouth daily.  90 tablet  1  . multivitamin (THERAGRAN) per tablet Take 1 tablet by mouth daily. ( with B - Complex )      . Omega-3 Fatty Acids (FISH OIL PO) Take 1 capsule by mouth daily. ( Mega Red )      . potassium chloride SA (K-DUR,KLOR-CON) 20 MEQ tablet Take 1 tablet (20 mEq total) by mouth daily.  30 tablet  6  . solifenacin (VESICARE) 5 MG tablet Take 5 mg by mouth daily.      Marland Kitchen warfarin (COUMADIN) 5 MG tablet 2.5mg s daily except 5mg s on Mondays, Wednesdays and Fridays or as directed by coumadin clinic  30 tablet  3  . calcium carbonate (OS-CAL) 600  MG TABS Take 600 mg by mouth daily with breakfast. Taking 1200 daily. Currently on HOLD       No current facility-administered medications for this visit.     Allergies:  Allergies  Allergen Reactions  . Pravachol     Muscle weakness  . Zocor [Simvastatin - High Dose]     Muscle weakness    Past Medical History, Surgical history, Social history, and Family History were reviewed and updated.   Physical Exam: Blood pressure 123/60, pulse 57, temperature 97.8 F (36.6 C), temperature source Oral, resp. rate 18, height 5\' 11"  (1.803 m), weight 192 lb 6.4 oz (87.272 kg), SpO2 99.00%. ECOG: 2 General appearance: alert, NAD. Head: Normocephalic, without obvious abnormality Neck: no adenopathy Lymph nodes: Cervical, supraclavicular, and axillary nodes normal. Heart:regular rate and rhythm, S1, S2 normal, no murmur, click, rub or gallop Lung:chest clear, no wheezing, rales, no dullness to percussion. Abdomin: soft, non-tender, without masses or organomegaly. Good BS EXT: Trace edema noted.  Lab Results: Lab Results  Component Value Date   WBC 8.2 10/03/2013   HGB 14.0 10/03/2013   HCT 43.3 10/03/2013   MCV 99.5* 10/03/2013   PLT 153 10/03/2013     Chemistry      Component Value Date/Time   NA 142 09/10/2013 1054   NA 143 08/23/2013 1018   K 4.1 09/10/2013 1054   K 4.4 08/23/2013 1018   CL 102 09/10/2013 1054   CO2 30 09/10/2013 1054   CO2 30* 08/23/2013 1018   BUN 43* 09/10/2013 1054   BUN 49.2* 08/23/2013 1018   CREATININE 1.7* 09/10/2013 1054   CREATININE 1.7* 08/23/2013 1018      Component Value Date/Time   CALCIUM 11.0* 09/10/2013 1054   CALCIUM 10.8* 08/23/2013 1018   ALKPHOS 52 09/10/2013 1054   ALKPHOS 53 08/23/2013 1018   AST 18 09/10/2013 1054   AST 15 08/23/2013 1018   ALT 10 09/10/2013 1054   ALT 10 08/23/2013 1018   BILITOT 1.2 09/10/2013 1054   BILITOT 0.70 08/23/2013 1018      Results for Craig [name], Craig [name] (MRN [id number]) as of 10/03/2013 15:56  Ref. Range 06/12/2013 11:45  07/18/2013 13:27 08/23/2013 10:18  PSA Latest Range: <=4.00 ng/mL 4.57 (H) 4.24 (H) 4.32 (H)       Impression and Plan:  80 year old gentleman with the following issues:   1. Castration resistant prostate cancer with metastatic disease to the bone. He is currently on reduced dose Xtandi with intermittent breaks and tolerating it well. His PSA continues to be stable around 4.3 The plan is to continue at 80 mg daily for the time being and to hold the medication if he develops any weakness episodes. The  wife is managing these breaks properly and will report any new changes.   2. Androgen depravation: He is to continue Lupron at this time.  3. Bone directed therapy: I recommend him to continue Xgeva which she has been getting on a monthly basis. This is given at [facility]  4. Hypercalcemia: His calcium level is pending from today. He stopped calcium supplements recently.   5. Followup: Will be in 4 to 5 weeks.  [facility], [name] 8/12/20154:27 PM

## 2013-10-03 NOTE — Telephone Encounter (Signed)
gv wife appt schedule for sept.

## 2013-10-04 ENCOUNTER — Encounter: Payer: Self-pay | Admitting: *Deleted

## 2013-10-04 ENCOUNTER — Telehealth: Payer: Self-pay | Admitting: Medical Oncology

## 2013-10-04 LAB — PSA: PSA: 4.66 ng/mL — ABNORMAL HIGH (ref <=4.00)

## 2013-10-04 NOTE — Progress Notes (Signed)
RECEIVED A FAX FROM [facility] CONCERNING A CONFIRMATION OF PRESCRIPTION SHIPMENT FOR XTANDI ON 10/03/13.

## 2013-10-04 NOTE — Telephone Encounter (Signed)
Per MD, informed pt/spouse of PSA results and Calcium results and patient to continue with staying off of calcium. Wife expressed understanding, no further questions at this time. Knows to call office with questions or conerns.

## 2013-10-04 NOTE — Telephone Encounter (Signed)
Message copied by Maxwell Marion on Thu Oct 04, 2013  9:51 AM ------      Message from: Wyatt Portela      Created: Thu Oct 04, 2013  8:46 AM       Please the result of PSA and Ca to the patient wife. PSA not changed much and the calcium to getting closer to normal.       Stay off the calcium for now. ------

## 2013-10-05 ENCOUNTER — Ambulatory Visit (INDEPENDENT_AMBULATORY_CARE_PROVIDER_SITE_OTHER): Payer: Medicare Other | Admitting: Pharmacist

## 2013-10-05 DIAGNOSIS — I4891 Unspecified atrial fibrillation: Secondary | ICD-10-CM

## 2013-10-05 DIAGNOSIS — I482 Chronic atrial fibrillation, unspecified: Secondary | ICD-10-CM

## 2013-10-05 DIAGNOSIS — Z5181 Encounter for therapeutic drug level monitoring: Secondary | ICD-10-CM

## 2013-10-05 LAB — POCT INR: INR: 2.3

## 2013-10-31 ENCOUNTER — Telehealth: Payer: Self-pay | Admitting: *Deleted

## 2013-10-31 NOTE — Telephone Encounter (Signed)
[facility] faxed Xtandi refill request.  Request to provider's desk/in-basket for review. 

## 2013-10-31 NOTE — Telephone Encounter (Signed)
Wife [name] left message to ensure [name] refill is processed to be on [facility] schedule for delivery.  Returned call to learn they have "car loaded, going out of town and will return 11-03-2013.  Has enough to last through 11-11-2013.  Don't mind switching to [facility] and don't mind picking this up.  We have an appointment on 11-07-2013 and ca pick it up then.  Please call the home and leave a message so I know what to do.  Only the two of [place] live here and privacy is not an issue." Archer notified of 11-07-2013 pick up date request.

## 2013-11-01 ENCOUNTER — Other Ambulatory Visit: Payer: Self-pay | Admitting: *Deleted

## 2013-11-01 DIAGNOSIS — C61 Malignant neoplasm of prostate: Secondary | ICD-10-CM

## 2013-11-01 MED ORDER — ENZALUTAMIDE 40 MG PO CAPS: 80.0000 mg | ORAL_CAPSULE | Freq: Every day | ORAL | Status: DC

## 2013-11-07 ENCOUNTER — Encounter: Payer: Self-pay | Admitting: Oncology

## 2013-11-07 ENCOUNTER — Other Ambulatory Visit (HOSPITAL_BASED_OUTPATIENT_CLINIC_OR_DEPARTMENT_OTHER): Payer: Medicare Other

## 2013-11-07 ENCOUNTER — Ambulatory Visit (HOSPITAL_BASED_OUTPATIENT_CLINIC_OR_DEPARTMENT_OTHER): Payer: Medicare Other | Admitting: Oncology

## 2013-11-07 ENCOUNTER — Other Ambulatory Visit: Payer: Self-pay

## 2013-11-07 ENCOUNTER — Telehealth: Payer: Self-pay | Admitting: Oncology

## 2013-11-07 ENCOUNTER — Ambulatory Visit (INDEPENDENT_AMBULATORY_CARE_PROVIDER_SITE_OTHER): Payer: Medicare Other | Admitting: *Deleted

## 2013-11-07 VITALS — BP 133/65 | HR 62 | Temp 97.5°F | Resp 18 | Ht 71.0 in | Wt 192.7 lb

## 2013-11-07 DIAGNOSIS — C61 Malignant neoplasm of prostate: Secondary | ICD-10-CM

## 2013-11-07 DIAGNOSIS — Z5181 Encounter for therapeutic drug level monitoring: Secondary | ICD-10-CM

## 2013-11-07 DIAGNOSIS — C7951 Secondary malignant neoplasm of bone: Secondary | ICD-10-CM

## 2013-11-07 DIAGNOSIS — I4891 Unspecified atrial fibrillation: Secondary | ICD-10-CM

## 2013-11-07 DIAGNOSIS — E291 Testicular hypofunction: Secondary | ICD-10-CM

## 2013-11-07 DIAGNOSIS — C7952 Secondary malignant neoplasm of bone marrow: Secondary | ICD-10-CM

## 2013-11-07 LAB — CBC WITH DIFFERENTIAL/PLATELET
BASO%: 0.9 % (ref 0.0–2.0)
Basophils Absolute: 0.1 10*3/uL (ref 0.0–0.1)
EOS%: 4.6 % (ref 0.0–7.0)
Eosinophils Absolute: 0.4 10*3/uL (ref 0.0–0.5)
HCT: 41.6 % (ref 38.4–49.9)
HGB: 13.5 g/dL (ref 13.0–17.1)
LYMPH%: 26.6 % (ref 14.0–49.0)
MCH: 32.1 pg (ref 27.2–33.4)
MCHC: 32.4 g/dL (ref 32.0–36.0)
MCV: 99.1 fL — ABNORMAL HIGH (ref 79.3–98.0)
MONO#: 0.7 10*3/uL (ref 0.1–0.9)
MONO%: 9.5 % (ref 0.0–14.0)
NEUT#: 4.5 10*3/uL (ref 1.5–6.5)
NEUT%: 58.4 % (ref 39.0–75.0)
PLATELETS: 150 10*3/uL (ref 140–400)
RBC: 4.2 10*6/uL (ref 4.20–5.82)
RDW: 14.3 % (ref 11.0–14.6)
WBC: 7.8 10*3/uL (ref 4.0–10.3)
lymph#: 2.1 10*3/uL (ref 0.9–3.3)

## 2013-11-07 LAB — COMPREHENSIVE METABOLIC PANEL (CC13)
ALK PHOS: 57 U/L (ref 40–150)
ALT: 6 U/L (ref 0–55)
AST: 15 U/L (ref 5–34)
Albumin: 3.3 g/dL — ABNORMAL LOW (ref 3.5–5.0)
Anion Gap: 11 mEq/L (ref 3–11)
BILIRUBIN TOTAL: 0.69 mg/dL (ref 0.20–1.20)
BUN: 66.5 mg/dL — ABNORMAL HIGH (ref 7.0–26.0)
CO2: 30 mEq/L — ABNORMAL HIGH (ref 22–29)
Calcium: 10.2 mg/dL (ref 8.4–10.4)
Chloride: 100 mEq/L (ref 98–109)
Creatinine: 1.7 mg/dL — ABNORMAL HIGH (ref 0.7–1.3)
Glucose: 112 mg/dl (ref 70–140)
Potassium: 4.5 mEq/L (ref 3.5–5.1)
SODIUM: 140 meq/L (ref 136–145)
TOTAL PROTEIN: 7.1 g/dL (ref 6.4–8.3)

## 2013-11-07 LAB — POCT INR: INR: 1.9

## 2013-11-07 MED ORDER — FUROSEMIDE 20 MG PO TABS: 20.0000 mg | ORAL_TABLET | Freq: Every day | ORAL | Status: DC

## 2013-11-07 NOTE — Progress Notes (Signed)
Hematology and Oncology Follow Up Visit  Craig [name] [id number] 23-Nov-1924 80 y.o. 11/07/2013 3:40 PM Craig [name], MDBrackbill, [name], MD   Principle Diagnosis: 80 year old with Castration resistant prostate cancer with metastatic disease to the bone. He was initially diagnosed in 2011 PSA of 19 and presented with advanced disease.   Prior Therapy: He is status post combined androgen deprivation with Lupron and Casodex with an excellent response initially and had a PSA nadir down to 1.67. Most recently he developed progression of disease and a PSA up to 5.94 in September of 2014.  Current therapy: He is on Xtandi started on 11/15/2012. He was started on 160 mg initially but the dose was reduced to 80 mg in December of 2014. He continues to be on Lupron and Xgeva done at [facility].  Interim History: [name] presents today for a followup visit with his wife. He he continues to tolerate the intermittent dosing of Xtandi without any complications. He is currently on 80 mg for the last 47 days without him developing any symptoms. He has not developed generalized weakness that results in holding the medication. He continues to ambulate without any difficulty. He has not had any falls or syncope. He sleeps relatively well with having to use the bathroom twice.Marland KitchenHas not reported any cough or hemoptysis or hematemesis. Does not report any nausea or vomiting or abdominal pain.Is not reporting any back pain or neurological symptoms. Does not report any headaches or blurry vision or double vision. Is not reporting any chest pain shortness of breath or difficulty breathing. Does not report any constipation or diarrhea. Does not report any hematochezia or melena. Does not report any skin rashes or lesions. Rest of his review of system is unremarkable.  Medications: I have reviewed the patient's current medications.  Current Outpatient Prescriptions  Medication Sig Dispense Refill  .  acetaminophen (TYLENOL) 500 MG tablet Take 500 mg by mouth 2 (two) times daily. Takes one in the AM and one qhs      . amLODipine (NORVASC) 2.5 MG tablet Take 1 tablet (2.5 mg total) by mouth daily.  90 tablet  1  . aspirin 81 MG chewable tablet Chew 81 mg by mouth daily.      Marland Kitchen atorvastatin (LIPITOR) 10 MG tablet Take 1 tablet (10 mg total) by mouth daily.  90 tablet  11  . B Complex-C (B-COMPLEX WITH VITAMIN C) tablet Take 1 tablet by mouth daily.        . bisoprolol-hydrochlorothiazide (ZIAC) 10-6.25 MG per tablet Take 1 tablet by mouth daily.  90 tablet  3  . calcium carbonate (OS-CAL) 600 MG TABS Take 600 mg by mouth daily with breakfast. Taking 1200 daily. Currently on HOLD      . Denosumab (XGEVA [place]) Inject into the skin every 30 (thirty) days. monthly      . Docusate Calcium (STOOL SOFTENER PO) Take 1 tablet by mouth 2 (two) times daily.       . enzalutamide (XTANDI) 40 MG capsule Take 2 capsules (80 mg total) by mouth daily.  60 capsule  0  . ezetimibe (ZETIA) 10 MG tablet Take 1 tablet (10 mg total) by mouth daily.  30 tablet  5  . furosemide (LASIX) 20 MG tablet Take 1 tablet (20 mg total) by mouth daily.  30 tablet  4  . Leuprolide Acetate (LUPRON DEPOT IM) Inject into the muscle every 3 (three) months.       Marland Kitchen losartan-hydrochlorothiazide (HYZAAR) 100-25  MG per tablet Take 1 tablet by mouth daily.  90 tablet  1  . multivitamin (THERAGRAN) per tablet Take 1 tablet by mouth daily. ( with B - Complex )      . Omega-3 Fatty Acids (FISH OIL PO) Take 1 capsule by mouth daily. ( Mega Red )      . potassium chloride SA (K-DUR,KLOR-CON) 20 MEQ tablet Take 1 tablet (20 mEq total) by mouth daily.  30 tablet  6  . solifenacin (VESICARE) 5 MG tablet Take 5 mg by mouth daily.      Marland Kitchen warfarin (COUMADIN) 5 MG tablet 2.5mg s daily except 5mg s on Mondays, Wednesdays and Fridays or as directed by coumadin clinic  30 tablet  3   No current facility-administered medications for this visit.      Allergies:  Allergies  Allergen Reactions  . Pravachol     Muscle weakness  . Zocor [Simvastatin - High Dose]     Muscle weakness    Past Medical History, Surgical history, Social history, and Family History were reviewed and updated.   Physical Exam: Blood pressure 133/65, pulse 62, temperature 97.5 F (36.4 C), temperature source Oral, resp. rate 18, height 5\' 11"  (1.803 m), weight 192 lb 11.2 oz (87.408 kg), SpO2 97.00%. ECOG: 2 General appearance: alert, NAD. Head: Normocephalic, without obvious abnormality Neck: no adenopathy Lymph nodes: Cervical, supraclavicular, and axillary nodes normal. Heart:regular rate and rhythm, S1, S2 normal, no murmur, click, rub or gallop Lung:chest clear, no wheezing, rales, no dullness to percussion. Abdomin: soft, non-tender, without masses or organomegaly. Good BS EXT: Trace edema noted.  Lab Results: Lab Results  Component Value Date   WBC 7.8 11/07/2013   HGB 13.5 11/07/2013   HCT 41.6 11/07/2013   MCV 99.1* 11/07/2013   PLT 150 11/07/2013     Chemistry      Component Value Date/Time   NA 140 11/07/2013 1451   NA 144 10/03/2013 1533   K 4.5 11/07/2013 1451   K 4.4 10/03/2013 1533   CL 100 10/03/2013 1533   CO2 30* 11/07/2013 1451   CO2 32 10/03/2013 1533   BUN 66.5* 11/07/2013 1451   BUN 54* 10/03/2013 1533   CREATININE 1.7* 11/07/2013 1451   CREATININE 1.86* 10/03/2013 1533      Component Value Date/Time   CALCIUM 10.2 11/07/2013 1451   CALCIUM 10.6* 10/03/2013 1533   ALKPHOS 57 11/07/2013 1451   ALKPHOS 61 10/03/2013 1533   AST 15 11/07/2013 1451   AST 15 10/03/2013 1533   ALT 6 11/07/2013 1451   ALT 9 10/03/2013 1533   BILITOT 0.69 11/07/2013 1451   BILITOT 0.5 10/03/2013 1533       Results for NHIA, HEAPHY (MRN [id number]) as of 11/07/2013 15:14  Ref. Range 07/18/2013 13:27 08/23/2013 10:18 10/03/2013 15:32  PSA Latest Range: <=4.00 ng/mL 4.24 (H) 4.32 (H) 4.66 (H)       Impression and Plan:  80 year old gentleman  with the following issues:   1. Castration resistant prostate cancer with metastatic disease to the bone. He is currently on reduced dose Xtandi with intermittent breaks and tolerating it well. He has been on it for the last 47 days without any complications. His PSA continues to be stable around 4.6 The plan is to continue at 80 mg daily for the time being and to hold the medication if he develops any weakness episodes. His wife is managing these breaks properly and will report any new changes.   2.  Androgen depravation: He is to continue Lupron at this time.  3. Bone directed therapy: I recommend him to continue Xgeva which she has been getting on a monthly basis. This is given at [facility].  4. Hypercalcemia: His calcium level is normal today and no intervention is needed.  5. Followup: Will be in 4 to 5 weeks.  Zola Button, MD 9/16/20153:40 PM

## 2013-11-07 NOTE — Telephone Encounter (Signed)
gv adn printed appt sched and avs for pt for OCT °

## 2013-11-08 ENCOUNTER — Encounter: Payer: Self-pay | Admitting: *Deleted

## 2013-11-08 LAB — PSA: PSA: 4.91 ng/mL — ABNORMAL HIGH (ref <=4.00)

## 2013-11-08 NOTE — Progress Notes (Signed)
Per dr Alen Blew, spoke with betty, patient's wife. Gave results of PSA

## 2013-11-13 ENCOUNTER — Other Ambulatory Visit: Payer: Self-pay

## 2013-11-13 MED ORDER — EZETIMIBE 10 MG PO TABS: 10.0000 mg | ORAL_TABLET | Freq: Every day | ORAL | Status: DC

## 2013-12-04 ENCOUNTER — Other Ambulatory Visit: Payer: Self-pay | Admitting: Oncology

## 2013-12-11 ENCOUNTER — Ambulatory Visit (INDEPENDENT_AMBULATORY_CARE_PROVIDER_SITE_OTHER): Payer: Medicare Other | Admitting: *Deleted

## 2013-12-11 ENCOUNTER — Encounter: Payer: Self-pay | Admitting: Oncology

## 2013-12-11 ENCOUNTER — Telehealth: Payer: Self-pay | Admitting: Oncology

## 2013-12-11 ENCOUNTER — Other Ambulatory Visit (HOSPITAL_BASED_OUTPATIENT_CLINIC_OR_DEPARTMENT_OTHER): Payer: Medicare Other

## 2013-12-11 ENCOUNTER — Ambulatory Visit (HOSPITAL_BASED_OUTPATIENT_CLINIC_OR_DEPARTMENT_OTHER): Payer: Medicare Other | Admitting: Oncology

## 2013-12-11 VITALS — BP 162/83 | HR 54 | Resp 18 | Ht 71.0 in | Wt 194.2 lb

## 2013-12-11 DIAGNOSIS — C7951 Secondary malignant neoplasm of bone: Secondary | ICD-10-CM

## 2013-12-11 DIAGNOSIS — E291 Testicular hypofunction: Secondary | ICD-10-CM

## 2013-12-11 DIAGNOSIS — I4891 Unspecified atrial fibrillation: Secondary | ICD-10-CM

## 2013-12-11 DIAGNOSIS — C61 Malignant neoplasm of prostate: Secondary | ICD-10-CM

## 2013-12-11 DIAGNOSIS — Z5181 Encounter for therapeutic drug level monitoring: Secondary | ICD-10-CM

## 2013-12-11 LAB — COMPREHENSIVE METABOLIC PANEL (CC13)
ALT: 10 U/L (ref 0–55)
AST: 16 U/L (ref 5–34)
Albumin: 3.5 g/dL (ref 3.5–5.0)
Alkaline Phosphatase: 62 U/L (ref 40–150)
Anion Gap: 12 mEq/L — ABNORMAL HIGH (ref 3–11)
BILIRUBIN TOTAL: 0.9 mg/dL (ref 0.20–1.20)
BUN: 53.6 mg/dL — ABNORMAL HIGH (ref 7.0–26.0)
CHLORIDE: 102 meq/L (ref 98–109)
CO2: 32 mEq/L — ABNORMAL HIGH (ref 22–29)
CREATININE: 1.7 mg/dL — AB (ref 0.7–1.3)
Calcium: 11.9 mg/dL — ABNORMAL HIGH (ref 8.4–10.4)
GLUCOSE: 84 mg/dL (ref 70–140)
Potassium: 4.3 mEq/L (ref 3.5–5.1)
Sodium: 146 mEq/L — ABNORMAL HIGH (ref 136–145)
TOTAL PROTEIN: 7.6 g/dL (ref 6.4–8.3)

## 2013-12-11 LAB — CBC WITH DIFFERENTIAL/PLATELET
BASO%: 0.4 % (ref 0.0–2.0)
BASOS ABS: 0 10*3/uL (ref 0.0–0.1)
EOS ABS: 0.3 10*3/uL (ref 0.0–0.5)
EOS%: 3.5 % (ref 0.0–7.0)
HEMATOCRIT: 46.4 % (ref 38.4–49.9)
HEMOGLOBIN: 15.2 g/dL (ref 13.0–17.1)
LYMPH#: 2.8 10*3/uL (ref 0.9–3.3)
LYMPH%: 31 % (ref 14.0–49.0)
MCH: 32.8 pg (ref 27.2–33.4)
MCHC: 32.8 g/dL (ref 32.0–36.0)
MCV: 100 fL — ABNORMAL HIGH (ref 79.3–98.0)
MONO#: 1 10*3/uL — AB (ref 0.1–0.9)
MONO%: 11.4 % (ref 0.0–14.0)
NEUT#: 4.8 10*3/uL (ref 1.5–6.5)
NEUT%: 53.7 % (ref 39.0–75.0)
PLATELETS: 162 10*3/uL (ref 140–400)
RBC: 4.64 10*6/uL (ref 4.20–5.82)
RDW: 14.2 % (ref 11.0–14.6)
WBC: 9 10*3/uL (ref 4.0–10.3)

## 2013-12-11 LAB — POCT INR: INR: 1.6

## 2013-12-11 NOTE — Telephone Encounter (Signed)
Pt confirmed labs/ov per 10/20 POF, gave pt AVS.... KJ °

## 2013-12-11 NOTE — Progress Notes (Signed)
Hematology and Oncology Follow Up Visit  Craig [name] [id number] [birth year] 80 y.o. 12/11/2013 1:25 PM Craig [name], MDBrackbill, Craig Moores, MD   Principle Diagnosis: 80 year old with Castration resistant prostate cancer with metastatic disease to the bone. He was initially diagnosed in 2011 PSA of 19 and presented with advanced disease.   Prior Therapy: He is status post combined androgen deprivation with Lupron and Casodex with an excellent response initially and had a PSA nadir down to 1.67. Most recently he developed progression of disease and a PSA up to 5.94 in September of 2014.  Current therapy: He is on Xtandi started on 11/15/2012. He was started on 160 mg initially but the dose was reduced to 80 mg in December of 2014. He continues to be on Lupron and Xgeva done at [facility].  Interim History: Craig [name] presents today for a followup visit with his wife. He he continues to tolerate the intermittent dosing of Xtandi without any complications. He is currently on 80 mg for 11 weeks but stopped it for the last 3 days. His is noticed that he developed a similar extremity weakness and stopped the medication for the time being. He is reporting some improvement at this time. He continues to ambulate without any difficulty. He has not had any falls or syncope. His appetite has been excellent and drinks one boost a day.Marland KitchenHas not reported any cough or hemoptysis or hematemesis. Does not report any nausea or vomiting or abdominal pain.Is not reporting any back pain or neurological symptoms. Does not report any headaches or blurry vision or double vision. Is not reporting any chest pain shortness of breath or difficulty breathing. Does not report any constipation or diarrhea. Does not report any hematochezia or melena. Does not report any skin rashes or lesions. Rest of his review of system is unremarkable.  Medications: I have reviewed the patient's current medications.  Current  Outpatient Prescriptions  Medication Sig Dispense Refill  . acetaminophen (TYLENOL) 500 MG tablet Take 500 mg by mouth 2 (two) times daily. Takes one in the AM and one qhs      . amLODipine (NORVASC) 2.5 MG tablet Take 1 tablet (2.5 mg total) by mouth daily.  90 tablet  1  . aspirin 81 MG chewable tablet Chew 81 mg by mouth daily.      Marland Kitchen atorvastatin (LIPITOR) 10 MG tablet Take 1 tablet (10 mg total) by mouth daily.  90 tablet  11  . B Complex-C (B-COMPLEX WITH VITAMIN C) tablet Take 1 tablet by mouth daily.        . bisoprolol-hydrochlorothiazide (ZIAC) 10-6.25 MG per tablet Take 1 tablet by mouth daily.  90 tablet  3  . calcium carbonate (OS-CAL) 600 MG TABS Take 600 mg by mouth daily with breakfast. Taking 1200 daily. Currently on HOLD      . Denosumab (XGEVA [place]) Inject into the skin every 30 (thirty) days. monthly      . Docusate Calcium (STOOL SOFTENER PO) Take 1 tablet by mouth 2 (two) times daily.       Marland Kitchen ezetimibe (ZETIA) 10 MG tablet Take 1 tablet (10 mg total) by mouth daily.  30 tablet  2  . furosemide (LASIX) 20 MG tablet Take 1 tablet (20 mg total) by mouth daily.  30 tablet  4  . Leuprolide Acetate (LUPRON DEPOT IM) Inject into the muscle every 3 (three) months.       Marland Kitchen losartan-hydrochlorothiazide (HYZAAR) 100-25 MG per tablet Take 1 tablet  by mouth daily.  90 tablet  1  . multivitamin (THERAGRAN) per tablet Take 1 tablet by mouth daily. ( with B - Complex )      . Omega-3 Fatty Acids (FISH OIL PO) Take 1 capsule by mouth daily. ( Mega Red )      . potassium chloride SA (K-DUR,KLOR-CON) 20 MEQ tablet Take 1 tablet (20 mEq total) by mouth daily.  30 tablet  6  . solifenacin (VESICARE) 5 MG tablet Take 5 mg by mouth daily.      Marland Kitchen warfarin (COUMADIN) 5 MG tablet 2.5mg s daily except 5mg s on Mondays, Wednesdays and Fridays or as directed by coumadin clinic  30 tablet  3  . XTANDI 40 MG capsule TAKE 2 CAPSULES BY MOUTH DAILY  60 capsule  0   No current facility-administered medications  for this visit.     Allergies:  Allergies  Allergen Reactions  . Pravachol     Muscle weakness  . Zocor [Simvastatin - High Dose]     Muscle weakness    Past Medical History, Surgical history, Social history, and Family History were reviewed and updated.   Physical Exam: Blood pressure 162/83, pulse 54, resp. rate 18, height 5\' 11"  (1.803 m), weight 194 lb 3.2 oz (88.089 kg). ECOG: 2 General appearance: alert, NAD. Head: Normocephalic, without obvious abnormality Neck: no adenopathy Lymph nodes: Cervical, supraclavicular, and axillary nodes normal. Heart:regular rate and rhythm, S1, S2 normal, no murmur, click, rub or gallop Lung:chest clear, no wheezing, rales, no dullness to percussion. Abdomin: soft, non-tender, without masses or organomegaly. Good bowel sounds. EXT: Trace edema noted.  Lab Results: Lab Results  Component Value Date   WBC 9.0 12/11/2013   HGB 15.2 12/11/2013   HCT 46.4 12/11/2013   MCV 100.0* 12/11/2013   PLT 162 12/11/2013     Chemistry      Component Value Date/Time   NA 140 11/07/2013 1451   NA 144 10/03/2013 1533   K 4.5 11/07/2013 1451   K 4.4 10/03/2013 1533   CL 100 10/03/2013 1533   CO2 30* 11/07/2013 1451   CO2 32 10/03/2013 1533   BUN 66.5* 11/07/2013 1451   BUN 54* 10/03/2013 1533   CREATININE 1.7* 11/07/2013 1451   CREATININE 1.86* 10/03/2013 1533      Component Value Date/Time   CALCIUM 10.2 11/07/2013 1451   CALCIUM 10.6* 10/03/2013 1533   ALKPHOS 57 11/07/2013 1451   ALKPHOS 61 10/03/2013 1533   AST 15 11/07/2013 1451   AST 15 10/03/2013 1533   ALT 6 11/07/2013 1451   ALT 9 10/03/2013 1533   BILITOT 0.69 11/07/2013 1451   BILITOT 0.5 10/03/2013 1533        Results for Craig [name] (MRN [id number]) as of 12/11/2013 13:12  Ref. Range 07/18/2013 13:27 08/23/2013 10:18 10/03/2013 15:32 11/07/2013 14:51  PSA Latest Range: <=4.00 ng/mL 4.24 (H) 4.32 (H) 4.66 (H) 4.91 (H)      Impression and Plan:  80 year old gentleman with the  following issues:   1. Castration resistant prostate cancer with metastatic disease to the bone. He is currently on reduced dose Xtandi with intermittent breaks and tolerating it well. He took it for 11 straight weeks but needed for breakthrough the time being. He is off of it for the last 3 days and his wife will estimate in about week or so. His PSA continued to be under excellent control for the time being.  2. Androgen depravation: He is to continue Lupron  at this time. He was recently given at [facility]  3. Bone directed therapy: I recommend him to continue Xgeva which she has been getting on a monthly basis. This is given at [facility].  4. Hypercalcemia: His calcium level is pending today.  5. Followup: Will be in 4 to 5 weeks.  YEBXID,HWYSH, MD 10/20/20151:25 PM

## 2013-12-11 NOTE — Addendum Note (Signed)
Addended by: Randolm Idol on: 12/11/2013 01:52 PM   Modules accepted: Orders

## 2013-12-12 ENCOUNTER — Telehealth: Payer: Self-pay | Admitting: *Deleted

## 2013-12-12 LAB — PSA: PSA: 5.11 ng/mL — AB (ref <=4.00)

## 2013-12-12 NOTE — Progress Notes (Signed)
Spoke with wife betty. Gave results of PSA and faxed labs to dr eskridge, per patient's request

## 2013-12-12 NOTE — Telephone Encounter (Signed)
Message copied by Randolm Idol on Wed Dec 12, 2013  1:25 PM ------      Message from: Wyatt Portela      Created: Wed Dec 12, 2013  9:13 AM       Please call his wife with PSA. No major change in the number. ------

## 2013-12-24 ENCOUNTER — Telehealth: Payer: Self-pay | Admitting: *Deleted

## 2013-12-24 NOTE — Telephone Encounter (Signed)
Wife Craig [name] calling, to say patient has started back on the Xtandi as of 12-23-13, after holding it for 2 weeks. Dr Alen Blew notified.

## 2013-12-25 ENCOUNTER — Ambulatory Visit (INDEPENDENT_AMBULATORY_CARE_PROVIDER_SITE_OTHER): Payer: Medicare Other

## 2013-12-25 DIAGNOSIS — Z5181 Encounter for therapeutic drug level monitoring: Secondary | ICD-10-CM

## 2013-12-25 DIAGNOSIS — I4891 Unspecified atrial fibrillation: Secondary | ICD-10-CM

## 2013-12-27 ENCOUNTER — Other Ambulatory Visit: Payer: Self-pay

## 2013-12-27 MED ORDER — AMLODIPINE BESYLATE 2.5 MG PO TABS: 2.5000 mg | ORAL_TABLET | Freq: Every day | ORAL | Status: DC

## 2014-01-07 ENCOUNTER — Other Ambulatory Visit: Payer: Self-pay | Admitting: *Deleted

## 2014-01-07 MED ORDER — WARFARIN SODIUM 5 MG PO TABS: 5.0000 mg | ORAL_TABLET | ORAL | Status: DC

## 2014-01-10 ENCOUNTER — Ambulatory Visit (INDEPENDENT_AMBULATORY_CARE_PROVIDER_SITE_OTHER): Payer: Medicare Other | Admitting: *Deleted

## 2014-01-10 DIAGNOSIS — I4891 Unspecified atrial fibrillation: Secondary | ICD-10-CM

## 2014-01-10 DIAGNOSIS — Z5181 Encounter for therapeutic drug level monitoring: Secondary | ICD-10-CM

## 2014-01-10 LAB — POCT INR: INR: 2.2

## 2014-01-16 ENCOUNTER — Telehealth: Payer: Self-pay | Admitting: Oncology

## 2014-01-16 ENCOUNTER — Other Ambulatory Visit (HOSPITAL_BASED_OUTPATIENT_CLINIC_OR_DEPARTMENT_OTHER): Payer: Medicare Other

## 2014-01-16 ENCOUNTER — Ambulatory Visit (HOSPITAL_BASED_OUTPATIENT_CLINIC_OR_DEPARTMENT_OTHER): Payer: Medicare Other | Admitting: Oncology

## 2014-01-16 ENCOUNTER — Other Ambulatory Visit: Payer: Self-pay | Admitting: Medical Oncology

## 2014-01-16 VITALS — BP 158/73 | HR 58 | Temp 97.1°F | Resp 18 | Ht 71.0 in | Wt 195.3 lb

## 2014-01-16 DIAGNOSIS — C61 Malignant neoplasm of prostate: Secondary | ICD-10-CM

## 2014-01-16 DIAGNOSIS — C7951 Secondary malignant neoplasm of bone: Secondary | ICD-10-CM

## 2014-01-16 LAB — CBC WITH DIFFERENTIAL/PLATELET
BASO%: 0.3 % (ref 0.0–2.0)
BASOS ABS: 0 10*3/uL (ref 0.0–0.1)
EOS%: 3.8 % (ref 0.0–7.0)
Eosinophils Absolute: 0.3 10*3/uL (ref 0.0–0.5)
HEMATOCRIT: 44.5 % (ref 38.4–49.9)
HEMOGLOBIN: 14.4 g/dL (ref 13.0–17.1)
LYMPH#: 1.8 10*3/uL (ref 0.9–3.3)
LYMPH%: 23.8 % (ref 14.0–49.0)
MCH: 32.4 pg (ref 27.2–33.4)
MCHC: 32.4 g/dL (ref 32.0–36.0)
MCV: 100 fL — ABNORMAL HIGH (ref 79.3–98.0)
MONO#: 0.8 10*3/uL (ref 0.1–0.9)
MONO%: 10.7 % (ref 0.0–14.0)
NEUT#: 4.7 10*3/uL (ref 1.5–6.5)
NEUT%: 61.4 % (ref 39.0–75.0)
Platelets: 133 10*3/uL — ABNORMAL LOW (ref 140–400)
RBC: 4.45 10*6/uL (ref 4.20–5.82)
RDW: 14 % (ref 11.0–14.6)
WBC: 7.7 10*3/uL (ref 4.0–10.3)

## 2014-01-16 LAB — COMPREHENSIVE METABOLIC PANEL (CC13)
ALBUMIN: 3.5 g/dL (ref 3.5–5.0)
ALT: 10 U/L (ref 0–55)
ANION GAP: 11 meq/L (ref 3–11)
AST: 17 U/L (ref 5–34)
Alkaline Phosphatase: 59 U/L (ref 40–150)
BUN: 47.5 mg/dL — ABNORMAL HIGH (ref 7.0–26.0)
CALCIUM: 10.9 mg/dL — AB (ref 8.4–10.4)
CO2: 31 meq/L — AB (ref 22–29)
Chloride: 103 mEq/L (ref 98–109)
Creatinine: 1.7 mg/dL — ABNORMAL HIGH (ref 0.7–1.3)
Glucose: 85 mg/dl (ref 70–140)
POTASSIUM: 4 meq/L (ref 3.5–5.1)
Sodium: 145 mEq/L (ref 136–145)
Total Bilirubin: 0.76 mg/dL (ref 0.20–1.20)
Total Protein: 7 g/dL (ref 6.4–8.3)

## 2014-01-16 MED ORDER — ENZALUTAMIDE 40 MG PO CAPS: 80.0000 mg | ORAL_CAPSULE | Freq: Every day | ORAL | Status: DC

## 2014-01-16 NOTE — Telephone Encounter (Signed)
Gave avs & cal for Dec. °

## 2014-01-16 NOTE — Telephone Encounter (Signed)
Per MD, refill of Xtandi e-scribed to [facility] outpatient pharm.

## 2014-01-16 NOTE — Progress Notes (Signed)
Hematology and Oncology Follow Up Visit  Ory Elting [id number] 1924-10-22 80 y.o. 01/16/2014 11:56 AM [name], MDBrackbill, [name], MD   Principle Diagnosis: 80 year old with Castration resistant prostate cancer with metastatic disease to the bone. He was initially diagnosed in 2011 PSA of 19 and presented with advanced disease.   Prior Therapy: He is status post combined androgen deprivation with Lupron and Casodex with an excellent response initially and had a PSA nadir down to 1.67. Most recently he developed progression of disease and a PSA up to 5.94 in September of 2014.  Current therapy: He is on Xtandi started on 11/15/2012. He was started on 160 mg initially but the dose was reduced to 80 mg in December of 2014. He continues to be on Lupron and Xgeva done at [facility].  Interim History: [name] presents today for a followup visit with his wife. He he continues to tolerate the intermittent dosing of Xtandi without any complications. He is currently on 80 mg that was resumed on 12/23/2013. He had a developed weakness in his lower extremities and doses were held for a few days. He is reporting some improvement at this time. He continues to ambulate without any difficulty. He has not had any falls or syncope. His appetite has been excellent and drinks one boost a day.Has not reported any cough or hemoptysis or hematemesis. Does not report any nausea or vomiting or abdominal pain.Is not reporting any back pain or neurological symptoms. Does not report any headaches or blurry vision or double vision. Is not reporting any chest pain shortness of breath or difficulty breathing. Does not report any constipation or diarrhea. Does not report any hematochezia or melena. Does not report any skin rashes or lesions. Rest of his review of system is unremarkable.  Medications: I have reviewed the patient's current medications.  Current Outpatient Prescriptions  Medication  Sig Dispense Refill  . acetaminophen (TYLENOL) 500 MG tablet Take 500 mg by mouth 2 (two) times daily. Takes one in the AM and one qhs    . amLODipine (NORVASC) 2.5 MG tablet Take 1 tablet (2.5 mg total) by mouth daily. 90 tablet 1  . aspirin 81 MG chewable tablet Chew 81 mg by mouth daily.    Marland Kitchen atorvastatin (LIPITOR) 10 MG tablet Take 1 tablet (10 mg total) by mouth daily. 90 tablet 11  . B Complex-C (B-COMPLEX WITH VITAMIN C) tablet Take 1 tablet by mouth daily.      . bisoprolol-hydrochlorothiazide (ZIAC) 10-6.25 MG per tablet Take 1 tablet by mouth daily. 90 tablet 3  . Denosumab (XGEVA [place]) Inject into the skin every 30 (thirty) days. monthly    . ezetimibe (ZETIA) 10 MG tablet Take 1 tablet (10 mg total) by mouth daily. 30 tablet 2  . furosemide (LASIX) 20 MG tablet Take 1 tablet (20 mg total) by mouth daily. 30 tablet 4  . Leuprolide Acetate (LUPRON DEPOT IM) Inject into the muscle every 3 (three) months.     Marland Kitchen losartan-hydrochlorothiazide (HYZAAR) 100-25 MG per tablet Take 1 tablet by mouth daily. 90 tablet 1  . multivitamin (THERAGRAN) per tablet Take 1 tablet by mouth daily. ( with B - Complex )    . Omega-3 Fatty Acids (FISH OIL PO) Take 1 capsule by mouth daily. ( Mega Red )    . potassium chloride SA (K-DUR,KLOR-CON) 20 MEQ tablet Take 1 tablet (20 mEq total) by mouth daily. 30 tablet 6  . solifenacin (VESICARE) 5 MG tablet  Take 5 mg by mouth daily.    Marland Kitchen warfarin (COUMADIN) 5 MG tablet Take 1 tablet (5 mg total) by mouth as directed. 30 tablet 3  . XTANDI 40 MG capsule TAKE 2 CAPSULES BY MOUTH DAILY 60 capsule 0   No current facility-administered medications for this visit.     Allergies:  Allergies  Allergen Reactions  . Pravachol     Muscle weakness  . Zocor [Simvastatin - High Dose]     Muscle weakness    Past Medical History, Surgical history, Social history, and Family History were reviewed and updated.   Physical Exam: Blood pressure 158/73, pulse 58, temperature  97.1 F (36.2 C), temperature source Oral, resp. rate 18, height 5\' 11"  (1.803 m), weight 195 lb 4.8 oz (88.587 kg). ECOG: 2 General appearance: alert, NAD. Head: Normocephalic, without obvious abnormality Neck: no adenopathy Lymph nodes: Cervical, supraclavicular, and axillary nodes normal. Heart:regular rate and rhythm, S1, S2 normal, no murmur, click, rub or gallop Lung:chest clear, no wheezing, rales, no dullness to percussion. Abdomin: soft, non-tender, without masses or organomegaly. Good bowel sounds. EXT: Trace edema noted.  Lab Results: Lab Results  Component Value Date   WBC 7.7 01/16/2014   HGB 14.4 01/16/2014   HCT 44.5 01/16/2014   MCV 100.0* 01/16/2014   PLT 133* 01/16/2014     Chemistry      Component Value Date/Time   NA 146* 12/11/2013 1246   NA 144 10/03/2013 1533   K 4.3 12/11/2013 1246   K 4.4 10/03/2013 1533   CL 100 10/03/2013 1533   CO2 32* 12/11/2013 1246   CO2 32 10/03/2013 1533   BUN 53.6* 12/11/2013 1246   BUN 54* 10/03/2013 1533   CREATININE 1.7* 12/11/2013 1246   CREATININE 1.86* 10/03/2013 1533      Component Value Date/Time   CALCIUM 11.9* 12/11/2013 1246   CALCIUM 10.6* 10/03/2013 1533   ALKPHOS 62 12/11/2013 1246   ALKPHOS 61 10/03/2013 1533   AST 16 12/11/2013 1246   AST 15 10/03/2013 1533   ALT 10 12/11/2013 1246   ALT 9 10/03/2013 1533   BILITOT 0.90 12/11/2013 1246   BILITOT 0.5 10/03/2013 1533         Results for LINO, WICKLIFF (MRN [id number]) as of 01/16/2014 11:58  Ref. Range 10/03/2013 15:32 11/07/2013 14:51 12/11/2013 12:45  PSA Latest Range: <=4.00 ng/mL 4.66 (H) 4.91 (H) 5.11 (H)      Impression and Plan:  80 year old gentleman with the following issues:   1. Castration resistant prostate cancer with metastatic disease to the bone. He is currently on reduced dose Xtandi with intermittent breaks and tolerating it well. He took it for 25 straight days now and continues to tolerate it well. His PSA did  increase slightly but remains under excellent control. The plan is to continue with the current dose and schedule.  2. Androgen depravation: He is to continue Lupron at this time. He was recently given at [facility]  3. Bone directed therapy: I recommend him to continue Xgeva which she has been getting on a monthly basis. This is given at [facility].  4. Hypercalcemia: His calcium level is pending today but elevated on previous testing.  5. Followup: Will be in 4 to 5 weeks.  [facility], MD 11/25/201511:56 AM

## 2014-01-17 LAB — PSA: PSA: 4.91 ng/mL — ABNORMAL HIGH (ref <=4.00)

## 2014-01-18 ENCOUNTER — Telehealth: Payer: Self-pay | Admitting: Medical Oncology

## 2014-01-18 NOTE — Telephone Encounter (Signed)
Per MD, informed patient's wife of PSA results. Expressed thanks, no further questions.

## 2014-01-18 NOTE — Telephone Encounter (Signed)
-----   Message from Wyatt Portela, MD sent at 01/18/2014  9:34 AM EST ----- Please call his wife with the PSA.

## 2014-01-21 ENCOUNTER — Other Ambulatory Visit (INDEPENDENT_AMBULATORY_CARE_PROVIDER_SITE_OTHER): Payer: Medicare Other | Admitting: *Deleted

## 2014-01-21 DIAGNOSIS — E785 Hyperlipidemia, unspecified: Secondary | ICD-10-CM

## 2014-01-21 DIAGNOSIS — Z79899 Other long term (current) drug therapy: Secondary | ICD-10-CM

## 2014-01-21 LAB — BASIC METABOLIC PANEL
BUN: 51 mg/dL — AB (ref 6–23)
CHLORIDE: 100 meq/L (ref 96–112)
CO2: 29 meq/L (ref 19–32)
CREATININE: 1.6 mg/dL — AB (ref 0.4–1.5)
Calcium: 9.9 mg/dL (ref 8.4–10.5)
GFR: 43.98 mL/min — ABNORMAL LOW (ref >60.00)
GLUCOSE: 86 mg/dL (ref 70–99)
Potassium: 3.9 mEq/L (ref 3.5–5.1)
Sodium: 138 mEq/L (ref 135–145)

## 2014-01-21 LAB — CBC WITH DIFFERENTIAL/PLATELET
Basophils Absolute: 0 10*3/uL (ref 0.0–0.1)
Basophils Relative: 0.4 % (ref 0.0–3.0)
EOS ABS: 0.3 10*3/uL (ref 0.0–0.7)
Eosinophils Relative: 3.8 % (ref 0.0–5.0)
HCT: 42.2 % (ref 39.0–52.0)
Hemoglobin: 13.9 g/dL (ref 13.0–17.0)
Lymphocytes Relative: 24.5 % (ref 12.0–46.0)
Lymphs Abs: 1.9 10*3/uL (ref 0.7–4.0)
MCHC: 33.1 g/dL (ref 30.0–36.0)
MCV: 98.5 fl (ref 78.0–100.0)
MONO ABS: 0.7 10*3/uL (ref 0.1–1.0)
Monocytes Relative: 9.4 % (ref 3.0–12.0)
Neutro Abs: 4.7 10*3/uL (ref 1.4–7.7)
Neutrophils Relative %: 61.9 % (ref 43.0–77.0)
PLATELETS: 155 10*3/uL (ref 150.0–400.0)
RBC: 4.28 Mil/uL (ref 4.22–5.81)
RDW: 14.6 % (ref 11.5–15.5)
WBC: 7.6 10*3/uL (ref 4.0–10.5)

## 2014-01-21 LAB — LIPID PANEL
CHOL/HDL RATIO: 4
Cholesterol: 167 mg/dL (ref 0–200)
HDL: 39.8 mg/dL (ref >39.00)
LDL Cholesterol: 95 mg/dL (ref 0–99)
NONHDL: 127.2
TRIGLYCERIDES: 160 mg/dL — AB (ref 0.0–149.0)
VLDL: 32 mg/dL (ref 0.0–40.0)

## 2014-01-21 LAB — HEPATIC FUNCTION PANEL
ALT: 8 U/L (ref 0–53)
AST: 19 U/L (ref 0–37)
Albumin: 3.5 g/dL (ref 3.5–5.2)
Alkaline Phosphatase: 48 U/L (ref 39–117)
BILIRUBIN DIRECT: 0.1 mg/dL (ref 0.0–0.3)
TOTAL PROTEIN: 6.6 g/dL (ref 6.0–8.3)
Total Bilirubin: 0.9 mg/dL (ref 0.2–1.2)

## 2014-01-21 NOTE — Progress Notes (Signed)
Quick Note:  Please make copy of labs for patient visit. ______ 

## 2014-01-23 ENCOUNTER — Telehealth: Payer: Self-pay | Admitting: *Deleted

## 2014-01-23 NOTE — Telephone Encounter (Signed)
New Message  Pt received vacc at [facility] Arelia Sneddon MD) in early October.

## 2014-01-24 ENCOUNTER — Ambulatory Visit (INDEPENDENT_AMBULATORY_CARE_PROVIDER_SITE_OTHER): Payer: Medicare Other | Admitting: Cardiology

## 2014-01-24 ENCOUNTER — Ambulatory Visit (INDEPENDENT_AMBULATORY_CARE_PROVIDER_SITE_OTHER): Payer: Medicare Other | Admitting: Pharmacist

## 2014-01-24 ENCOUNTER — Encounter: Payer: Self-pay | Admitting: Cardiology

## 2014-01-24 VITALS — BP 124/76 | HR 59 | Ht 71.0 in | Wt 194.0 lb

## 2014-01-24 DIAGNOSIS — I482 Chronic atrial fibrillation, unspecified: Secondary | ICD-10-CM

## 2014-01-24 DIAGNOSIS — I119 Hypertensive heart disease without heart failure: Secondary | ICD-10-CM

## 2014-01-24 DIAGNOSIS — N184 Chronic kidney disease, stage 4 (severe): Secondary | ICD-10-CM

## 2014-01-24 DIAGNOSIS — E785 Hyperlipidemia, unspecified: Secondary | ICD-10-CM

## 2014-01-24 DIAGNOSIS — I4891 Unspecified atrial fibrillation: Secondary | ICD-10-CM

## 2014-01-24 DIAGNOSIS — Z5181 Encounter for therapeutic drug level monitoring: Secondary | ICD-10-CM

## 2014-01-24 LAB — POCT INR: INR: 2.1

## 2014-01-24 NOTE — Assessment & Plan Note (Signed)
Renal function is remaining stable

## 2014-01-24 NOTE — Progress Notes (Addendum)
Craig [name]. Date of Birth:  Nov 09, 1924 [facility] [address] 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 He has a history of essential hypertension and a previous thrombotic stroke. Over the summer 2014 he was hospitalized for a suspected mild stroke with transient left-sided weakness. He has established atrial fibrillation and is on Coumadin. He has hypercholesterolemia and essential hypertension. He also has metastatic prostate cancer. His last visit he has been doing well. He is on injections for his bony metastases which have helped and he gets the injections once a month. He is now under the care of Dr. Osker Mason.Marland Kitchen He is on new medication Xtandi.40 mg 4 tablets a day. His PSA has been declining. He is having more overall weakness and also more incontinence. He now has to wear depends. He has nocturia x2. Since last visit he has not been having any new cardiac symptoms.  He has a past history of epistaxis which has improved since we cut back on the dosing of his warfarin to a lower: INR 1.8 up to 2.2.  Since last visit he has had no new cardiovascular symptoms Current Outpatient Prescriptions  Medication Sig Dispense Refill  . acetaminophen (TYLENOL) 500 MG tablet Take 500 mg by mouth 2 (two) times daily. Takes one in the AM and one qhs    . amLODipine (NORVASC) 2.5 MG tablet Take 1 tablet (2.5 mg total) by mouth daily. 90 tablet 1  . aspirin 81 MG chewable tablet Chew 81 mg by mouth daily.    Marland Kitchen atorvastatin (LIPITOR) 10 MG tablet Take 1 tablet (10 mg total) by mouth daily. 90 tablet 11  . B Complex-C (B-COMPLEX WITH VITAMIN C) tablet Take 1 tablet by mouth daily.      . bisoprolol-hydrochlorothiazide (ZIAC) 10-6.25 MG per tablet Take 1 tablet by mouth daily. 90 tablet 3  . Denosumab (XGEVA [place]) Inject into the skin  every 30 (thirty) days. monthly    . enzalutamide (XTANDI) 40 MG capsule Take 2 capsules (80 mg total) by mouth daily. 60 capsule 0  . ezetimibe (ZETIA) 10 MG tablet Take 1 tablet (10 mg total) by mouth daily. 30 tablet 2  . furosemide (LASIX) 20 MG tablet Take 1 tablet (20 mg total) by mouth daily. 30 tablet 4  . Leuprolide Acetate (LUPRON DEPOT IM) Inject into the muscle every 3 (three) months.     Marland Kitchen losartan-hydrochlorothiazide (HYZAAR) 100-25 MG per tablet Take 1 tablet by mouth daily. 90 tablet 1  . multivitamin (THERAGRAN) per tablet Take 1 tablet by mouth daily. ( with B - Complex )    . Omega-3 Fatty Acids (FISH OIL PO) Take 1 capsule by mouth daily. ( Mega Red )    . potassium chloride SA (K-DUR,KLOR-CON) 20 MEQ tablet Take 1 tablet (20 mEq total) by mouth daily. 30 tablet 6  . solifenacin (VESICARE) 5 MG tablet Take 5 mg by mouth daily.    Marland Kitchen warfarin (COUMADIN) 5 MG tablet Take 1 tablet (5 mg total) by mouth as directed. 30 tablet 3   No current facility-administered medications for this visit.    Allergies  Allergen Reactions  . Pravachol     Muscle weakness  . Zocor [Simvastatin - High Dose]     Muscle weakness    Patient Active Problem  List   Diagnosis Date Noted  . Cerebellar stroke syndrome 07/27/2010    Priority: High  . Benign hypertensive heart disease without heart failure 07/27/2010    Priority: High  . Atrial fibrillation 05/25/2010    Priority: High  . Encounter for therapeutic drug monitoring 05/14/2013  . Chronic kidney disease (CKD), stage IV (severe) 09/27/2012  . Elevated brain natriuretic peptide (BNP) level 09/27/2012  . Hypercalcemia 09/27/2012  . Weakness 09/27/2012  . Epistaxis 01/08/2011  . Hypercholesterolemia 07/27/2010  . Prostate cancer 07/27/2010  . Renal insufficiency 07/27/2010    History  Smoking status  . Former Smoker -- .5 years  . Types: Cigarettes  . Quit date: 02/22/1953  Smokeless tobacco  . Never Used    History    Alcohol Use  . Yes    Comment: 09/27/2012 "glass of wine or 2/month"    Family History  Problem Relation Age of Onset  . Heart failure Mother   . Heart attack Father     Review of Systems: Constitutional: no fever chills diaphoresis or fatigue or change in weight.  Head and neck: no hearing loss, no epistaxis, no photophobia or visual disturbance. Respiratory: No cough, shortness of breath or wheezing. Cardiovascular: No chest pain peripheral edema, palpitations. Gastrointestinal: No abdominal distention, no abdominal pain, no change in bowel habits hematochezia or melena. Genitourinary: No dysuria, no frequency, no urgency, no nocturia. Musculoskeletal:No arthralgias, no back pain, no gait disturbance or myalgias. Neurological: No dizziness, no headaches, no numbness, no seizures, no syncope, no weakness, no tremors. Hematologic: No lymphadenopathy, no easy bruising. Psychiatric: No confusion, no hallucinations, no sleep disturbance.    Physical Exam: Filed Vitals:   01/24/14 1009  BP: 124/76  Pulse: 59   the general appearance reveals an elderly gentleman who arrives in a wheelchair.The head and neck exam reveals pupils equal and reactive.  Extraocular movements are full.  There is no scleral icterus.  The mouth and pharynx are normal.  The neck is supple.  The carotids reveal no bruits.  The jugular venous pressure is normal.  The  thyroid is not enlarged.  There is no lymphadenopathy.  The chest is clear to percussion and auscultation.  There are no rales or rhonchi.  Expansion of the chest is symmetrical.  The precordium is quiet.  The heart rhythm is irregularly irregular. The first heart sound is normal.  The second heart sound is physiologically split.  There is no murmur gallop rub or click.  There is no abnormal lift or heave.  The abdomen is soft and nontender.  The bowel sounds are normal.  The liver and spleen are not enlarged.  There are no abdominal masses.  There are no  abdominal bruits.  Extremities reveal good pedal pulses.  There is mild pretibial and ankle edema worse on the left.  There is no cyanosis or clubbing.  Strength is normal and symmetrical in all extremities.  There is no lateralizing weakness.  There are no sensory deficits.  The skin is warm and dry.  There is no rash.  EKG shows atrial fibrillation with slow ventricular response of 59.  There is left anterior hemiblock.  Nonspecific ST-T wave changes.  QTc interval is normal.  No change since 09/27/12.   Assessment / Plan: 1. permanent atrial fibrillation on Coumadin 2. essential hypertension without heart failure 3. Hyperlipidemia 4. old stroke 5. prostate cancer metastatic to bone  Disposition continue on same medication.  Recheck in 4 months for office visit CBC  lipid panel hepatic function panel and basal metabolic panel.

## 2014-01-24 NOTE — Patient Instructions (Signed)
Your physician recommends that you continue on your current medications as directed. Please refer to the Current Medication list given to you today.  Your physician wants you to follow-up in: 4 months with fasting labs (lp/bmet/hfp) You will receive a reminder letter in the mail two months in advance. If you don't receive a letter, please call our office to schedule the follow-up appointment.  

## 2014-01-24 NOTE — Assessment & Plan Note (Signed)
The patient has had no new TIA symptoms.

## 2014-01-24 NOTE — Assessment & Plan Note (Signed)
Blood pressure has remained stable.  No dizziness or syncope.  His walking is unstable and he walks with a rolling walker

## 2014-02-01 ENCOUNTER — Other Ambulatory Visit: Payer: Self-pay | Admitting: Cardiology

## 2014-02-04 ENCOUNTER — Telehealth: Payer: Self-pay | Admitting: *Deleted

## 2014-02-04 NOTE — Telephone Encounter (Signed)
Spouse [name] called reporting "Craig [name]'s legs are weak.  He's fallen twice, his legs are weakened from the [place] so please let Dr. Alen Blew know I am taking him off the [facility].  He is going to the orthopedist and hopefully he can get a cortisone injection.  I will call and notify you all when he resumes Xtandi.  Took a dose yesterday."  Will notify MD.

## 2014-02-08 ENCOUNTER — Encounter (HOSPITAL_COMMUNITY): Payer: Self-pay | Admitting: Emergency Medicine

## 2014-02-08 ENCOUNTER — Telehealth: Payer: Self-pay | Admitting: Cardiology

## 2014-02-08 ENCOUNTER — Inpatient Hospital Stay (HOSPITAL_COMMUNITY): Payer: Medicare Other

## 2014-02-08 ENCOUNTER — Inpatient Hospital Stay (HOSPITAL_COMMUNITY)
Admission: EM | Admit: 2014-02-08 | Discharge: 2014-02-13 | DRG: 552 | Disposition: A | Discharge status: Home / Self Care | Payer: Medicare Other | Attending: Internal Medicine | Admitting: Internal Medicine

## 2014-02-08 ENCOUNTER — Emergency Department (HOSPITAL_COMMUNITY): Payer: Medicare Other

## 2014-02-08 DIAGNOSIS — Z7901 Long term (current) use of anticoagulants: Secondary | ICD-10-CM | POA: Diagnosis not present

## 2014-02-08 DIAGNOSIS — Z9842 Cataract extraction status, left eye: Secondary | ICD-10-CM | POA: Diagnosis not present

## 2014-02-08 DIAGNOSIS — I5032 Chronic diastolic (congestive) heart failure: Secondary | ICD-10-CM

## 2014-02-08 DIAGNOSIS — I482 Chronic atrial fibrillation, unspecified: Secondary | ICD-10-CM | POA: Diagnosis present

## 2014-02-08 DIAGNOSIS — M6289 Other specified disorders of muscle: Secondary | ICD-10-CM

## 2014-02-08 DIAGNOSIS — M25552 Pain in left hip: Secondary | ICD-10-CM | POA: Diagnosis present

## 2014-02-08 DIAGNOSIS — E876 Hypokalemia: Secondary | ICD-10-CM | POA: Diagnosis not present

## 2014-02-08 DIAGNOSIS — Z9841 Cataract extraction status, right eye: Secondary | ICD-10-CM | POA: Diagnosis not present

## 2014-02-08 DIAGNOSIS — I714 Abdominal aortic aneurysm, without rupture, unspecified: Secondary | ICD-10-CM

## 2014-02-08 DIAGNOSIS — Z79899 Other long term (current) drug therapy: Secondary | ICD-10-CM

## 2014-02-08 DIAGNOSIS — N184 Chronic kidney disease, stage 4 (severe): Secondary | ICD-10-CM | POA: Diagnosis present

## 2014-02-08 DIAGNOSIS — N183 Chronic kidney disease, stage 3 unspecified: Secondary | ICD-10-CM | POA: Diagnosis present

## 2014-02-08 DIAGNOSIS — C7951 Secondary malignant neoplasm of bone: Secondary | ICD-10-CM | POA: Diagnosis present

## 2014-02-08 DIAGNOSIS — I10 Essential (primary) hypertension: Secondary | ICD-10-CM

## 2014-02-08 DIAGNOSIS — Z888 Allergy status to other drugs, medicaments and biological substances status: Secondary | ICD-10-CM | POA: Diagnosis not present

## 2014-02-08 DIAGNOSIS — K219 Gastro-esophageal reflux disease without esophagitis: Secondary | ICD-10-CM | POA: Diagnosis present

## 2014-02-08 DIAGNOSIS — E785 Hyperlipidemia, unspecified: Secondary | ICD-10-CM | POA: Diagnosis present

## 2014-02-08 DIAGNOSIS — R52 Pain, unspecified: Secondary | ICD-10-CM

## 2014-02-08 DIAGNOSIS — M545 Low back pain: Principal | ICD-10-CM | POA: Diagnosis present

## 2014-02-08 DIAGNOSIS — Z961 Presence of intraocular lens: Secondary | ICD-10-CM | POA: Diagnosis present

## 2014-02-08 DIAGNOSIS — Z87891 Personal history of nicotine dependence: Secondary | ICD-10-CM

## 2014-02-08 DIAGNOSIS — C61 Malignant neoplasm of prostate: Secondary | ICD-10-CM | POA: Diagnosis not present

## 2014-02-08 DIAGNOSIS — I480 Paroxysmal atrial fibrillation: Secondary | ICD-10-CM

## 2014-02-08 DIAGNOSIS — E7800 Pure hypercholesterolemia, unspecified: Secondary | ICD-10-CM | POA: Diagnosis present

## 2014-02-08 DIAGNOSIS — R531 Weakness: Secondary | ICD-10-CM | POA: Diagnosis not present

## 2014-02-08 DIAGNOSIS — I69354 Hemiplegia and hemiparesis following cerebral infarction affecting left non-dominant side: Secondary | ICD-10-CM | POA: Diagnosis not present

## 2014-02-08 DIAGNOSIS — E8770 Fluid overload, unspecified: Secondary | ICD-10-CM | POA: Diagnosis present

## 2014-02-08 DIAGNOSIS — R001 Bradycardia, unspecified: Secondary | ICD-10-CM | POA: Diagnosis present

## 2014-02-08 DIAGNOSIS — Z7982 Long term (current) use of aspirin: Secondary | ICD-10-CM | POA: Diagnosis not present

## 2014-02-08 DIAGNOSIS — I130 Hypertensive heart and chronic kidney disease with heart failure and stage 1 through stage 4 chronic kidney disease, or unspecified chronic kidney disease: Secondary | ICD-10-CM | POA: Diagnosis present

## 2014-02-08 DIAGNOSIS — I119 Hypertensive heart disease without heart failure: Secondary | ICD-10-CM

## 2014-02-08 DIAGNOSIS — R7989 Other specified abnormal findings of blood chemistry: Secondary | ICD-10-CM

## 2014-02-08 DIAGNOSIS — G43909 Migraine, unspecified, not intractable, without status migrainosus: Secondary | ICD-10-CM | POA: Diagnosis present

## 2014-02-08 DIAGNOSIS — Z510 Encounter for antineoplastic radiation therapy: Secondary | ICD-10-CM | POA: Diagnosis present

## 2014-02-08 DIAGNOSIS — Z8673 Personal history of transient ischemic attack (TIA), and cerebral infarction without residual deficits: Secondary | ICD-10-CM

## 2014-02-08 DIAGNOSIS — M549 Dorsalgia, unspecified: Secondary | ICD-10-CM | POA: Diagnosis present

## 2014-02-08 LAB — BASIC METABOLIC PANEL
Anion gap: 14 (ref 5–15)
BUN: 71 mg/dL — AB (ref 6–23)
CALCIUM: 10.3 mg/dL (ref 8.4–10.5)
CO2: 29 meq/L (ref 19–32)
CREATININE: 1.33 mg/dL (ref 0.50–1.35)
Chloride: 100 mEq/L (ref 96–112)
GFR calc Af Amer: 54 mL/min — ABNORMAL LOW (ref >90)
GFR calc non Af Amer: 46 mL/min — ABNORMAL LOW (ref >90)
Glucose, Bld: 114 mg/dL — ABNORMAL HIGH (ref 70–99)
Potassium: 4.2 mEq/L (ref 3.7–5.3)
SODIUM: 143 meq/L (ref 137–147)

## 2014-02-08 LAB — CBC WITH DIFFERENTIAL/PLATELET
Basophils Absolute: 0 10*3/uL (ref 0.0–0.1)
Basophils Relative: 0 % (ref 0–1)
EOS ABS: 0.2 10*3/uL (ref 0.0–0.7)
EOS PCT: 2 % (ref 0–5)
HCT: 41.6 % (ref 39.0–52.0)
Hemoglobin: 13.5 g/dL (ref 13.0–17.0)
LYMPHS ABS: 1.5 10*3/uL (ref 0.7–4.0)
Lymphocytes Relative: 15 % (ref 12–46)
MCH: 31.8 pg (ref 26.0–34.0)
MCHC: 32.5 g/dL (ref 30.0–36.0)
MCV: 97.9 fL (ref 78.0–100.0)
MONO ABS: 1.2 10*3/uL — AB (ref 0.1–1.0)
Monocytes Relative: 12 % (ref 3–12)
Neutro Abs: 7.1 10*3/uL (ref 1.7–7.7)
Neutrophils Relative %: 71 % (ref 43–77)
PLATELETS: 215 10*3/uL (ref 150–400)
RBC: 4.25 MIL/uL (ref 4.22–5.81)
RDW: 13.8 % (ref 11.5–15.5)
WBC: 9.9 10*3/uL (ref 4.0–10.5)

## 2014-02-08 LAB — URINALYSIS, ROUTINE W REFLEX MICROSCOPIC
BILIRUBIN URINE: NEGATIVE
GLUCOSE, UA: NEGATIVE mg/dL
Hgb urine dipstick: NEGATIVE
Ketones, ur: NEGATIVE mg/dL
Leukocytes, UA: NEGATIVE
Nitrite: NEGATIVE
PH: 7 (ref 5.0–8.0)
Protein, ur: NEGATIVE mg/dL
SPECIFIC GRAVITY, URINE: 1.018 (ref 1.005–1.030)
Urobilinogen, UA: 1 mg/dL (ref 0.0–1.0)

## 2014-02-08 LAB — PRO B NATRIURETIC PEPTIDE: PRO B NATRI PEPTIDE: 6362 pg/mL — AB (ref 0–450)

## 2014-02-08 LAB — I-STAT TROPONIN, ED: TROPONIN I, POC: 0 ng/mL (ref 0.00–0.08)

## 2014-02-08 LAB — PROTIME-INR
INR: 1.93 — ABNORMAL HIGH (ref 0.00–1.49)
PROTHROMBIN TIME: 22.2 s — AB (ref 11.6–15.2)

## 2014-02-08 LAB — TROPONIN I: Troponin I: <0.3 ng/mL (ref <0.30)

## 2014-02-08 MED ORDER — FUROSEMIDE 10 MG/ML IJ SOLN
40.0000 mg | INTRAMUSCULAR | Status: AC
  Administered 2014-02-08: 40 mg via INTRAVENOUS
  Filled 2014-02-08: qty 4

## 2014-02-08 MED ORDER — SODIUM CHLORIDE 0.9 % IJ SOLN
3.0000 mL | Freq: Two times a day (BID) | INTRAMUSCULAR | Status: DC
  Administered 2014-02-11 – 2014-02-13 (×4): 3 mL via INTRAVENOUS

## 2014-02-08 MED ORDER — POTASSIUM CHLORIDE CRYS ER 10 MEQ PO TBCR
10.0000 meq | EXTENDED_RELEASE_TABLET | Freq: Every day | ORAL | Status: DC
  Administered 2014-02-09 – 2014-02-13 (×5): 10 meq via ORAL
  Filled 2014-02-08 (×5): qty 1

## 2014-02-08 MED ORDER — BISOPROLOL-HYDROCHLOROTHIAZIDE 10-6.25 MG PO TABS: 1.0000 | ORAL_TABLET | Freq: Every day | ORAL | Status: DC

## 2014-02-08 MED ORDER — AMLODIPINE BESYLATE 2.5 MG PO TABS
2.5000 mg | ORAL_TABLET | Freq: Every day | ORAL | Status: DC
  Administered 2014-02-08: 2.5 mg via ORAL
  Filled 2014-02-08 (×2): qty 1

## 2014-02-08 MED ORDER — ACETAMINOPHEN 500 MG PO TABS
500.0000 mg | ORAL_TABLET | Freq: Two times a day (BID) | ORAL | Status: DC
  Administered 2014-02-08 – 2014-02-09 (×2): 500 mg via ORAL
  Filled 2014-02-08 (×3): qty 1

## 2014-02-08 MED ORDER — SODIUM CHLORIDE 0.9 % IJ SOLN
3.0000 mL | Freq: Two times a day (BID) | INTRAMUSCULAR | Status: DC
  Administered 2014-02-08 – 2014-02-11 (×7): 3 mL via INTRAVENOUS

## 2014-02-08 MED ORDER — ASPIRIN 81 MG PO CHEW
81.0000 mg | CHEWABLE_TABLET | Freq: Every day | ORAL | Status: DC
  Administered 2014-02-08 – 2014-02-11 (×4): 81 mg via ORAL
  Filled 2014-02-08 (×4): qty 1

## 2014-02-08 MED ORDER — OMEGA-3-ACID ETHYL ESTERS 1 G PO CAPS
1.0000 g | ORAL_CAPSULE | Freq: Every day | ORAL | Status: DC
  Administered 2014-02-09 – 2014-02-13 (×5): 1 g via ORAL
  Filled 2014-02-08 (×5): qty 1

## 2014-02-08 MED ORDER — SODIUM CHLORIDE 0.9 % IJ SOLN: 3.0000 mL | INTRAMUSCULAR | Status: DC | PRN

## 2014-02-08 MED ORDER — BISOPROLOL-HYDROCHLOROTHIAZIDE 10-6.25 MG PO TABS
1.0000 | ORAL_TABLET | Freq: Every day | ORAL | Status: DC
  Administered 2014-02-08 – 2014-02-09 (×2): 1 via ORAL
  Filled 2014-02-08 (×4): qty 1

## 2014-02-08 MED ORDER — DOCUSATE SODIUM 100 MG PO CAPS
100.0000 mg | ORAL_CAPSULE | Freq: Two times a day (BID) | ORAL | Status: DC
  Administered 2014-02-08 – 2014-02-13 (×9): 100 mg via ORAL
  Filled 2014-02-08 (×12): qty 1

## 2014-02-08 MED ORDER — SODIUM CHLORIDE 0.9 % IV SOLN: 250.0000 mL | INTRAVENOUS | Status: DC | PRN

## 2014-02-08 MED ORDER — ADULT MULTIVITAMIN W/MINERALS CH
1.0000 | ORAL_TABLET | Freq: Every day | ORAL | Status: DC
  Administered 2014-02-09 – 2014-02-13 (×5): 1 via ORAL
  Filled 2014-02-08 (×5): qty 1

## 2014-02-08 MED ORDER — FUROSEMIDE 20 MG PO TABS
20.0000 mg | ORAL_TABLET | Freq: Every day | ORAL | Status: DC
  Administered 2014-02-09: 20 mg via ORAL
  Filled 2014-02-08 (×2): qty 1

## 2014-02-08 MED ORDER — ATORVASTATIN CALCIUM 10 MG PO TABS
10.0000 mg | ORAL_TABLET | Freq: Every day | ORAL | Status: DC
  Administered 2014-02-08 – 2014-02-13 (×6): 10 mg via ORAL
  Filled 2014-02-08 (×6): qty 1

## 2014-02-08 MED ORDER — EZETIMIBE 10 MG PO TABS
10.0000 mg | ORAL_TABLET | Freq: Every day | ORAL | Status: DC
  Administered 2014-02-08 – 2014-02-13 (×6): 10 mg via ORAL
  Filled 2014-02-08 (×6): qty 1

## 2014-02-08 MED ORDER — SODIUM CHLORIDE 0.9 % IV BOLUS (SEPSIS)
1000.0000 mL | INTRAVENOUS | Status: AC
  Administered 2014-02-08: 1000 mL via INTRAVENOUS

## 2014-02-08 MED ORDER — WARFARIN SODIUM 2.5 MG PO TABS
2.5000 mg | ORAL_TABLET | ORAL | Status: DC
  Administered 2014-02-09 – 2014-02-12 (×3): 2.5 mg via ORAL
  Filled 2014-02-08 (×4): qty 1

## 2014-02-08 MED ORDER — WARFARIN - PHARMACIST DOSING INPATIENT
Freq: Every day | Status: DC
  Administered 2014-02-11: 18:00:00

## 2014-02-08 MED ORDER — DARIFENACIN HYDROBROMIDE ER 7.5 MG PO TB24
7.5000 mg | ORAL_TABLET | Freq: Every day | ORAL | Status: DC
  Administered 2014-02-08 – 2014-02-09 (×2): 7.5 mg via ORAL
  Filled 2014-02-08 (×3): qty 1

## 2014-02-08 MED ORDER — B COMPLEX-C PO TABS
1.0000 | ORAL_TABLET | Freq: Every day | ORAL | Status: DC
  Administered 2014-02-09 – 2014-02-13 (×5): 1 via ORAL
  Filled 2014-02-08 (×5): qty 1

## 2014-02-08 MED ORDER — WARFARIN SODIUM 5 MG PO TABS
5.0000 mg | ORAL_TABLET | ORAL | Status: DC
  Administered 2014-02-08 – 2014-02-11 (×2): 5 mg via ORAL
  Filled 2014-02-08 (×5): qty 1

## 2014-02-08 NOTE — ED Notes (Signed)
Attempted to give report to [place]. RN unable to take report at this time, will call back.

## 2014-02-08 NOTE — ED Notes (Signed)
Pt wife at bedside. Per EMS pt was sluggish yesterday, could not get out of bed, leaning to left side. Pt has been on chemotherapy for 6 weeks for prostate cancer. Per EMS pt has been on this same chemo before and it made his legs weak, and was taken off of it before and it helped his leg weakness however, this time being taken off of the chemo, his legs aren't getting better. Pt wife at bedside stating last seen normal was last night, because during the night around 6 am, he was more weak. Pt has hx of stroke, hx of a fib is on coumadin. Pt wife reports recent falls.

## 2014-02-08 NOTE — H&P (Signed)
[facility] History and Physical  Craig [name]. Craig [name] DOB: 24-Nov-1924 DOA: 02/08/2014  Referring physician: Glade Lloyd, NP PCP: [name], MD   Chief Complaint: Left side weakness.   HPI: Craig [name]. 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 Per wife patient fell December the 8, hit his back, had hematoma in the forearm. Last Saturday he fell 3 times. He hit his left knee. Subsequently he saw his orthopedic Dr. Deborha Payment did X ray of left knee and hip which were negative. Patient had steroid injection of left knee. After the injection he was doing better, was able to use his walker. The day prior to admission patient was notice to be sluggish. This morning patient wake up with left side weakness, difficulty with ambulation. No slurred speech, no vision changes. No fever, no dyspnea , no chest pain.  Patient received IV fluids and lasix in the ED.  Of note patient wife stop chemo for prostate cancer due to concern that might cause weakness. Dr Alen Blew is aware per wife.   Review of Systems:  Negative, except as per HPI.   Past Medical History  Diagnosis Date  . Hyperlipidemia   . Hypokalemia   . Hypertensive heart disease   . Chronic back pain   . History of epistaxis 07/19/2002  . Personal history of long-term (current) use of anticoagulants   . ICH (intracerebral hemorrhage) ~ 1999    after TPA/notes 09/27/2012  . Hypertension   . Atrial fibrillation   . PAT (paroxysmal atrial tachycardia)   . Pneumonia     "once; several years ago" (09/27/2012)  . GERD (gastroesophageal reflux disease)   . H/O hiatal hernia   . Migraines     "migraines without headaches years ago" (09/27/2012)  . Stroke ~ 1999    ischemic / right cerebellar/posterior inferior cerebellar artery / right pos infarct  . Melanoma    "top of my head" (09/27/2012)  . Prostate cancer, primary, with metastasis from prostate to other site     on Lupron injections per GU  . Osteoarthritis of both feet    Past Surgical History  Procedure Laterality Date  . Nasal sinus surgery  1998  . Cataract extraction w/ intraocular lens  implant, bilateral Bilateral ~ 2011  . Skin graft Right 2010  . Melanoma excision  2010    "pre-melanoma on top of head; did skin graft from left thigh to cover" (09/27/2012)  . Tonsillectomy  ~ 1935   Social History:  reports that he quit smoking about 61 years ago. His smoking use included Cigarettes. He smoked 0.00 packs per day for .5 years. He has never used smokeless tobacco. He reports that he drinks alcohol. He reports that he does not use illicit drugs.  Allergies  Allergen Reactions  . Pravachol     Muscle weakness  . Zocor [Simvastatin - High Dose]     Muscle weakness    Family History  Problem Relation Age of Onset  . Heart failure Mother   . Heart attack Father      Prior to Admission medications   Medication Sig Start Date End Date Taking? Authorizing Provider  acetaminophen (TYLENOL) 500 MG tablet Take 500 mg by mouth 2 (two) times daily. Takes one in the AM and one qhs   Yes Historical Provider, MD  amLODipine (NORVASC) 2.5 MG tablet Take 1 tablet (2.5 mg total) by mouth daily. 12/27/13 12/27/14 Yes [name], MD  aspirin 81 MG chewable tablet Chew 81 mg by mouth daily.   Yes Historical Provider, MD  atorvastatin (LIPITOR) 10 MG tablet Take 1 tablet (10 mg total) by mouth daily. 09/12/13  Yes [name], MD  B Complex-C (B-COMPLEX WITH VITAMIN C) tablet Take 1 tablet by mouth daily.     Yes Historical Provider, MD  bisoprolol-hydrochlorothiazide (ZIAC) 10-6.25 MG per tablet Take 1 tablet by mouth daily. 02/23/13  Yes Peter M Martinique, MD  Denosumab (XGEVA [place]) Inject into the skin every 30 (thirty) days. monthly   Yes Historical Provider, MD  Docusate Calcium (STOOL SOFTENER  PO) Take 1 capsule by mouth 2 (two) times daily.   Yes Historical Provider, MD  enzalutamide [name]) 40 MG capsule Take 2 capsules (80 mg total) by mouth daily. 01/16/14  Yes Wyatt Portela, MD  furosemide (LASIX) 20 MG tablet Take 1 tablet (20 mg total) by mouth daily. 11/07/13 11/07/14 Yes [name], MD  Leuprolide Acetate (LUPRON DEPOT IM) Inject 1 each into the muscle every 6 (six) months.    Yes Historical Provider, MD  losartan-hydrochlorothiazide (HYZAAR) 100-25 MG per tablet Take 1 tablet by mouth daily. 09/11/13  Yes [name], MD  multivitamin [facility]) per tablet Take 1 tablet by mouth daily. ( with B - Complex )   Yes Historical Provider, MD  Omega-3 Fatty Acids (FISH OIL PO) Take 1 capsule by mouth daily. ( Mega Red )   Yes Historical Provider, MD  potassium chloride SA (K-DUR,KLOR-CON) 20 MEQ tablet Take 1 tablet (20 mEq total) by mouth daily. Patient taking differently: Take 10 mEq by mouth daily.  07/19/13  Yes [name], MD  solifenacin (VESICARE) 5 MG tablet Take 5 mg by mouth daily.   Yes Historical Provider, MD  warfarin (COUMADIN) 5 MG tablet Take 1 tablet (5 mg total) by mouth as directed. Patient taking differently: Take 2.5-5 mg by mouth daily. 5mg  daily on Monday Wednesday and Friday, 2.5mg  daily all other days 01/07/14  Yes [name], MD  ZETIA 10 MG tablet TAKE 1 TABLET BY MOUTH ONCE DAILY 02/01/14  Yes [name], MD   Physical Exam: Filed Vitals:   02/08/14 1600 02/08/14 1615 02/08/14 1700 02/08/14 1715  BP: 191/82 175/67 148/97 164/71  Pulse:  61  61  Temp:      TempSrc:      Resp: 10 11 17 23   SpO2:  96%  100%    Wt Readings from Last 3 Encounters:  01/24/14 87.998 kg (194 lb)  01/16/14 88.587 kg (195 lb 4.8 oz)  12/11/13 88.089 kg (194 lb 3.2 oz)    General:  Appears calm and comfortable Eyes: PERRL, normal lids, irises & conjunctiva ENT: grossly normal hearing, lips & tongue Neck: no LAD, masses or  thyromegaly Cardiovascular: RRR, no m/r/g. No LE edema. Telemetry: SR, no arrhythmias  Respiratory: bilateral crackles,  Normal respiratory effort. Abdomen: soft, ntnd Skin: no rash or induration seen on limited exam Musculoskeletal: grossly normal tone BUE/BLE Psychiatric: grossly normal mood and affect, speech fluent and appropriate Neurologic: Left LE with weakness. 4/5. Unclear if secondary to pain.           Labs on Admission:  Basic Metabolic Panel:  Recent Labs Lab 02/08/14 1148  NA 143  K 4.2  CL 100  CO2 29  GLUCOSE 114*  BUN 71*  CREATININE 1.33  CALCIUM 10.3   Liver Function Tests: No results for input(s): AST, ALT, ALKPHOS, BILITOT, PROT, ALBUMIN in the last 168 hours. No results for input(s): LIPASE, AMYLASE in the last 168 hours. No results for input(s): AMMONIA in the last 168 hours. CBC:  Recent Labs Lab 02/08/14 1148  WBC 9.9  NEUTROABS 7.1  HGB 13.5  HCT 41.6  MCV 97.9  PLT 215   Cardiac Enzymes: No results for input(s): CKTOTAL, CKMB, CKMBINDEX, TROPONINI in the last 168 hours.  BNP (last 3 results)  Recent Labs  02/08/14 1148  PROBNP 6362.0*   CBG: No results for input(s): GLUCAP in the last 168 hours.  Radiological Exams on Admission: Ct Head Wo Contrast  02/08/2014   CLINICAL DATA:  Left-sided weakness. , fall. Slurred speech. Prior stroke.  EXAM: CT HEAD WITHOUT CONTRAST  TECHNIQUE: Contiguous axial images were obtained from the base of the skull through the vertex without intravenous contrast.  COMPARISON:  02/06/2013  FINDINGS: Old right cerebellar infarct, stable. Diffuse age related volume loss/atrophy. No acute intracranial abnormality. Specifically, no hemorrhage, hydrocephalus, mass lesion, acute infarction, or significant intracranial injury. No acute calvarial abnormality. Visualized paranasal sinuses and mastoids clear. Orbital soft tissues unremarkable.  IMPRESSION: No acute intracranial abnormality.   Electronically Signed    By: Rolm Baptise M.D.   On: 02/08/2014 14:28    EKG: Independently reviewed. A fib.   Assessment/Plan Active Problems:   Atrial fibrillation   Hypercholesterolemia   Chronic kidney disease (CKD), stage IV (severe)   Fluid overload   Left-sided weakness  1-Left side weakness; unclear if related to pain, patient with frequent fall.  Will admit to telemetry, MRI ordered rule out stroke. Will get CT left hip rule out fracture.  If MRI brain negative, and weakness doesn't improved, might need MRI lumbar spine.   2-Elevated BNP; check Chest X-ray, ECHO, cycle cardiac enzymes. Cardiology consulted. Patient cardiologist is Dr Mare Ferrari. Continue with home dose lasix.   3-Increase BUN; repeat in am. Hold ACE, HCTZ.   4-Left Hip pain; check CT rule out fracture.   5-A fib; continue with bisoprolol. Coumadin per pharmacy.  6-Frequent fall; ? Secondary to weakness, unbalance. See work up for weakness. Needs PT, OT.    Code Status: Full Code.  DVT Prophylaxis:on coumadin.  Family Communication: Care discussed with Wife.  Disposition Plan: expect 2 to 3 days inpatient.   Time spent: 75 minutes.   Niel Hummer A [facility] Pager [phone]

## 2014-02-08 NOTE — ED Notes (Signed)
Soda given to pt. 

## 2014-02-08 NOTE — Telephone Encounter (Signed)
Pt's wife wants pt seen today, can't get him up so will have to call ems to bring him here, I told her she may have to take him to ER but wants nurse to call her, he has left sided weakness, past stroke, denies chest pain, sob , dizziness, pls advise

## 2014-02-08 NOTE — ED Notes (Signed)
Patient transported to CT 

## 2014-02-08 NOTE — ED Notes (Addendum)
Pt unable to move from EMS stretcher to bed. Pt appears very weak. Per wife pt fell 3x this past Monday on his knees. Pt alert and oriented x4. Per wife pt sounds normal, his voice is slurred but that is a result from his previous stroke.

## 2014-02-08 NOTE — ED Notes (Signed)
Dr Nishan at bedside.  

## 2014-02-08 NOTE — Telephone Encounter (Signed)
Okay; thanks.

## 2014-02-08 NOTE — Telephone Encounter (Signed)
Spoke with pt's wife , she states that pt had a stroke in 2014 he has been doing fine. Yesterday evening pt started having left side weakness. Wife states that she helped pt to the restroom last night and she got some help from the neighbors to get him back in bed. Pt today  is so groggy, unable to get up. Wife states that she will need to call the EMS to bring him to the office to be seen. Wife was advised that pt needs to be taken to the, ER because pt probable had re stroked again. Pt's wife verbalized understanding and will call EMS to be taking to the [facility].

## 2014-02-08 NOTE — Progress Notes (Signed)
Pt BP elevated in 211D systolic. Pt given nightime norvasc and transporter notified to come back for MRI once medicine given

## 2014-02-08 NOTE — ED Provider Notes (Signed)
CSN: [id number]     Arrival date & time 02/08/14  1037 History   First MD Initiated Contact with Patient 02/08/14 1105     Chief Complaint  Patient presents with  . Extremity Weakness   (Consider location/radiation/quality/duration/timing/severity/associated sxs/prior Treatment) HPI Craig [name]. is an 80 yo male presenting with report of weak ness this am.  Wife reports he has been falling more lately, most recently over the weekend resulting in some bruising and injury to his knees.  He was seen and treated by his orthopedic doctor 4 days ago with negative x-rays.  She states she was helping him ambulate to the bathroom this morning and he needed a lot of support as he was walking which is unusual for him.  She reports he noticed generally weak but possibly more so on the left.  She denies any changes in his speech or alertness at that time.  The pt denies any chest pain, shortness of breath, nausea, vomiting, or abd pain.  Past Medical History  Diagnosis Date  . Hyperlipidemia   . Hypokalemia   . Hypertensive heart disease   . Chronic back pain   . History of epistaxis 07/19/2002  . Personal history of long-term (current) use of anticoagulants   . ICH (intracerebral hemorrhage) ~ 1999    after TPA/notes 09/27/2012  . Hypertension   . Atrial fibrillation   . PAT (paroxysmal atrial tachycardia)   . Pneumonia     "once; several years ago" (09/27/2012)  . GERD (gastroesophageal reflux disease)   . H/O hiatal hernia   . Migraines     "migraines without headaches years ago" (09/27/2012)  . Stroke ~ 1999    ischemic / right cerebellar/posterior inferior cerebellar artery / right pos infarct  . Melanoma     "top of my head" (09/27/2012)  . Prostate cancer, primary, with metastasis from prostate to other site     on Lupron injections per GU  . Osteoarthritis of both feet    Past Surgical History  Procedure Laterality Date  . Nasal sinus surgery  1998  . Cataract extraction w/  intraocular lens  implant, bilateral Bilateral ~ 2011  . Skin graft Right 2010  . Melanoma excision  2010    "pre-melanoma on top of head; did skin graft from left thigh to cover" (09/27/2012)  . Tonsillectomy  ~ 1935   Family History  Problem Relation Age of Onset  . Heart failure Mother   . Heart attack Father    History  Substance Use Topics  . Smoking status: Former Smoker -- .5 years    Types: Cigarettes    Quit date: 02/22/1953  . Smokeless tobacco: Never Used  . Alcohol Use: Yes     Comment: 09/27/2012 "glass of wine or 2/month"    Review of Systems  Constitutional: Negative for fever and chills.  HENT: Negative for sore throat.   Eyes: Negative for visual disturbance.  Respiratory: Negative for cough and shortness of breath.   Cardiovascular: Negative for chest pain and leg swelling.  Gastrointestinal: Negative for nausea, vomiting and diarrhea.  Genitourinary: Negative for dysuria.  Musculoskeletal: Positive for myalgias and arthralgias.  Skin: Negative for rash.  Neurological: Positive for weakness. Negative for numbness and headaches.   Allergies  Pravachol and Zocor  Home Medications   Prior to Admission medications   Medication Sig Start Date End Date Taking? Authorizing Provider  acetaminophen (TYLENOL) 500 MG tablet Take 500 mg by mouth 2 (two) times daily.  Takes one in the AM and one qhs    Historical Provider, MD  amLODipine (NORVASC) 2.5 MG tablet Take 1 tablet (2.5 mg total) by mouth daily. 12/27/13 12/27/14  [name], MD  aspirin 81 MG chewable tablet Chew 81 mg by mouth daily.    Historical Provider, MD  atorvastatin (LIPITOR) 10 MG tablet Take 1 tablet (10 mg total) by mouth daily. 09/12/13   [name], MD  B Complex-C (B-COMPLEX WITH VITAMIN C) tablet Take 1 tablet by mouth daily.      Historical Provider, MD  bisoprolol-hydrochlorothiazide [facility]) 10-6.25 MG per tablet Take 1 tablet by mouth daily. 02/23/13   Peter M Martinique, MD  Denosumab  (XGEVA [place]) Inject into the skin every 30 (thirty) days. monthly    Historical Provider, MD  enzalutamide [name]) 40 MG capsule Take 2 capsules (80 mg total) by mouth daily. 01/16/14   Wyatt Portela, MD  furosemide (LASIX) 20 MG tablet Take 1 tablet (20 mg total) by mouth daily. 11/07/13 11/07/14  [name], MD  Leuprolide Acetate (LUPRON DEPOT IM) Inject into the muscle every 3 (three) months.     Historical Provider, MD  losartan-hydrochlorothiazide (HYZAAR) 100-25 MG per tablet Take 1 tablet by mouth daily. 09/11/13   [name], MD  multivitamin [facility]) per tablet Take 1 tablet by mouth daily. ( with B - Complex )    Historical Provider, MD  Omega-3 Fatty Acids (FISH OIL PO) Take 1 capsule by mouth daily. ( Mega Red )    Historical Provider, MD  potassium chloride SA (K-DUR,KLOR-CON) 20 MEQ tablet Take 1 tablet (20 mEq total) by mouth daily. 07/19/13   [name], MD  solifenacin (VESICARE) 5 MG tablet Take 5 mg by mouth daily.    Historical Provider, MD  warfarin (COUMADIN) 5 MG tablet Take 1 tablet (5 mg total) by mouth as directed. 01/07/14   [name], MD  ZETIA 10 MG tablet TAKE 1 TABLET BY MOUTH ONCE DAILY 02/01/14   [name], MD   BP 166/84 mmHg  Pulse 65  Temp(Src) 97.6 F (36.4 C) (Oral)  Resp 11  SpO2 100% Physical Exam  Constitutional: He appears well-developed and well-nourished. No distress.  HENT:  Head: Normocephalic and atraumatic.  Mouth/Throat: Mucous membranes are dry. No oropharyngeal exudate.  Eyes: Conjunctivae are normal.  Neck: Neck supple. No thyromegaly present.  Cardiovascular: Normal rate, regular rhythm and intact distal pulses.   Pulmonary/Chest: Effort normal and breath sounds normal. No respiratory distress. He has no wheezes. He has no rales. He exhibits no tenderness.  Abdominal: Soft. There is no tenderness.  Musculoskeletal: He exhibits tenderness.       Left hip: He exhibits tenderness.       Left knee:  Tenderness found.       Legs: Lymphadenopathy:    He has no cervical adenopathy.  Neurological: He is alert.  Skin: Skin is warm and dry. No rash noted. He is not diaphoretic.  Psychiatric: He has a normal mood and affect.  Nursing note and vitals reviewed.   ED Course  Procedures (including critical care time) Labs Review Labs Reviewed  CBC WITH DIFFERENTIAL - Abnormal; Notable for the following:    Monocytes Absolute 1.2 (*)    All other components within normal limits  BASIC METABOLIC PANEL - Abnormal; Notable for the following:    Glucose, Bld 114 (*)    BUN 71 (*)    GFR calc non Af Amer 46 (*)  GFR calc Af Amer 54 (*)    All other components within normal limits  PRO B NATRIURETIC PEPTIDE - Abnormal; Notable for the following:    Pro B Natriuretic peptide (BNP) 6362.0 (*)    All other components within normal limits  PROTIME-INR - Abnormal; Notable for the following:    Prothrombin Time 22.2 (*)    INR 1.93 (*)    All other components within normal limits  URINALYSIS, ROUTINE W REFLEX MICROSCOPIC  I-STAT TROPOININ, ED    Imaging Review Ct Head Wo Contrast  02/08/2014   CLINICAL DATA:  Left-sided weakness. , fall. Slurred speech. Prior stroke.  EXAM: CT HEAD WITHOUT CONTRAST  TECHNIQUE: Contiguous axial images were obtained from the base of the skull through the vertex without intravenous contrast.  COMPARISON:  02/06/2013  FINDINGS: Old right cerebellar infarct, stable. Diffuse age related volume loss/atrophy. No acute intracranial abnormality. Specifically, no hemorrhage, hydrocephalus, mass lesion, acute infarction, or significant intracranial injury. No acute calvarial abnormality. Visualized paranasal sinuses and mastoids clear. Orbital soft tissues unremarkable.  IMPRESSION: No acute intracranial abnormality.   Electronically Signed   By: Rolm Baptise M.D.   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  His wife reports decreased oral intake and dry mucous membranes noted on initial exam. His most recent echo from 2014 shows EF 60-65%, 1 L NS ordered to infuse over 1 hour.  Knee and hip have been xray'd by PCP with no fracture noted.  He has a normal neuro exam.   CBC, BMP, Troponin, UA, EKG, Head CT.   Labs reviewed, BUN elevated higher than last analysis to 71.  Discussed with Dr. Reather Converse pt's need for admission based on inability to ambulate in the ED and increase in BUN.    Consulted Dr. Maryland Pink (hospitalist) regarding admission for evaluation of weakness and set-up resources for rehab.  Dr. Maryland Pink would like BNP and INR drawn prior to acceptance for admission.  Labs ordered and discussed with Dr. Maryland Pink results.  BNP elevated and fluids stopped and Lasix IV ordered.  Pt accepted for admission. Pt updated with admission plan.  The patient appears reasonably stabilized for admission considering the current resources, flow, and capabilities available in the ED at this time, and I doubt any further screening and/or treatment in the ED prior to admission.  Filed Vitals:   02/08/14 1350 02/08/14 1445 02/08/14 1515 02/08/14 1600  BP: 169/70 184/65 123/95 191/82  Pulse:  69 63   Temp:      TempSrc:      Resp: 19 14 15 10   SpO2: 100% 100% 96%    Meds given in ED:  Medications  furosemide (LASIX) injection 40 mg (not administered)  sodium chloride  0.9 % bolus 1,000 mL (1,000 mLs Intravenous New Bag/Given 02/08/14 1220)    New Prescriptions   No medications on file  Britt Bottom, NP 02/09/14 2118  Mariea Clonts, MD 02/10/14 1539

## 2014-02-08 NOTE — ED Notes (Signed)
Attempted to give report again to [place], was transferred to charge, charge refused to take report and said the assigned pt room's RN will call back in 5 minutes.

## 2014-02-08 NOTE — Consult Note (Signed)
CARDIOLOGY CONSULT NOTE       Patient ID: Craig [name]. MRN: [id number] DOB/AGE: [birth year] 80 y.o.  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  History of chronic afib on coumadin. Earlier this year had a lot of nose bleeds and target INR lowered to 1.9-2.2 History of CVA with some residual left sided weakness.  Diastolic CHF with echo 1/74 showing EF 60-5% mild AS and estimated PA pressure 50 mmHg.  On low Dose lasix and HCTZ but always prerenal with Cr 1.7 and BUN 50-70.  A week of fatigue and weakness.  Lethargy  Increased falls.  Seen by Dr Durward Fortes for left Knee effusion and no fracture Wife thinks left side a bit weaker.  CT negative for acute event and INR 1.95 on admission.  Denies chest pain dyspnea and no real change in mild chronic LE edema.  Compliant with meds BP elevated in ER  Also being Rx for metastatic prostate CA to bones.    ROS All other systems reviewed and negative except as noted above  Past Medical History  Diagnosis Date  . Hyperlipidemia   . Hypokalemia   . Hypertensive heart disease   . Chronic back pain   . History of epistaxis 07/19/2002  . Personal history of long-term (current) use of anticoagulants   . ICH (intracerebral hemorrhage) ~ 1999    after TPA/notes 09/27/2012  . Hypertension   . Atrial fibrillation   . PAT (paroxysmal atrial tachycardia)   . Pneumonia     "once; several years ago" (09/27/2012)  . GERD (gastroesophageal reflux disease)   . H/O hiatal hernia   . Migraines     "migraines without headaches years ago" (09/27/2012)  . Stroke ~ 1999    ischemic / right cerebellar/posterior inferior cerebellar  artery / right pos infarct  . Melanoma     "top of my head" (09/27/2012)  . Prostate cancer, primary, with metastasis from prostate to other site     on Lupron injections per GU  . Osteoarthritis of both feet     Family History  Problem Relation Age of Onset  . Heart failure Mother   . Heart attack Father     History   Social History  . Marital Status: Married    Spouse Name: [name]    Number of Children: 3  . Years of Education: MD   Occupational History  .     Social History Main Topics  . Smoking status: Former Smoker -- .5 years    Types: Cigarettes    Quit date: 02/22/1953  . Smokeless tobacco: Never Used  . Alcohol Use: Yes     Comment: 09/27/2012 "glass of wine or 2/month"  . Drug Use: No  . Sexual Activity: No   Other Topics Concern  . Not on file   Social History Narrative   Patient lives at home with spouse.   Caffeine Use: 1/4 cup daily    Past Surgical History  Procedure Laterality Date  . Nasal sinus surgery  1998  . Cataract extraction w/ intraocular lens  implant, bilateral Bilateral ~ 2011  . Skin graft Right 2010  . Melanoma excision  2010    "  pre-melanoma on top of head; did skin graft from left thigh to cover" (09/27/2012)  . Tonsillectomy  ~ 1935        Physical Exam: Blood pressure 164/71, pulse 61, temperature 97.6 F (36.4 C), temperature source Oral, resp. rate 23, SpO2 100 %.    Affect appropriate Frail elderly male  HEENT: normal Neck supple with no adenopathy JVP normal no bruits no thyromegaly Lungs clear with no wheezing and good diaphragmatic motion Heart:  S1/S2 mild AS murmur, no rub, gallop or click PMI normal Abdomen: benighn, BS positve, no tenderness, no AAA no bruit.  No HSM or HJR Distal pulses intact with no bruits Plus one bilateral edema Neuro non-focal Skin warm and dry Mild swelling/ bruising left knee from fall   Labs:   Lab Results  Component Value Date   WBC 9.9 02/08/2014   HGB 13.5 02/08/2014    HCT 41.6 02/08/2014   MCV 97.9 02/08/2014   PLT 215 02/08/2014    Recent Labs Lab 02/08/14 1148  NA 143  K 4.2  CL 100  CO2 29  BUN 71*  CREATININE 1.33  CALCIUM 10.3  GLUCOSE 114*   Lab Results  Component Value Date   CKTOTAL 114 04/27/2009   CKMB 1.9 04/27/2009   TROPONINI 0.01        NO INDICATION OF MYOCARDIAL INJURY. 04/27/2009    Lab Results  Component Value Date   CHOL 167 01/21/2014   CHOL 173 09/10/2013   CHOL 168 05/07/2013   Lab Results  Component Value Date   HDL 39.80 01/21/2014   HDL 45.40 09/10/2013   HDL 45.50 05/07/2013   Lab Results  Component Value Date   LDLCALC 95 01/21/2014   LDLCALC 95 09/10/2013   LDLCALC 89 05/07/2013   Lab Results  Component Value Date   TRIG 160.0* 01/21/2014   TRIG 165.0* 09/10/2013   TRIG 168.0* 05/07/2013   Lab Results  Component Value Date   CHOLHDL 4 01/21/2014   CHOLHDL 4 09/10/2013   CHOLHDL 4 05/07/2013   No results found for: LDLDIRECT    No current facility-administered medications on file prior to encounter.   Current Outpatient Prescriptions on File Prior to Encounter  Medication Sig Dispense Refill  . acetaminophen (TYLENOL) 500 MG tablet Take 500 mg by mouth 2 (two) times daily. Takes one in the AM and one qhs    . amLODipine (NORVASC) 2.5 MG tablet Take 1 tablet (2.5 mg total) by mouth daily. 90 tablet 1  . aspirin 81 MG chewable tablet Chew 81 mg by mouth daily.    Marland Kitchen atorvastatin (LIPITOR) 10 MG tablet Take 1 tablet (10 mg total) by mouth daily. 90 tablet 11  . B Complex-C (B-COMPLEX WITH VITAMIN C) tablet Take 1 tablet by mouth daily.      . bisoprolol-hydrochlorothiazide (ZIAC) 10-6.25 MG per tablet Take 1 tablet by mouth daily. 90 tablet 3  . Denosumab (XGEVA [place]) Inject into the skin every 30 (thirty) days. monthly    . enzalutamide (XTANDI) 40 MG capsule Take 2 capsules (80 mg total) by mouth daily. 60 capsule 0  . furosemide (LASIX) 20 MG tablet Take 1 tablet (20 mg total) by mouth  daily. 30 tablet 4  . Leuprolide Acetate (LUPRON DEPOT IM) Inject 1 each into the muscle every 6 (six) months.     Marland Kitchen losartan-hydrochlorothiazide (HYZAAR) 100-25 MG per tablet Take 1 tablet by mouth daily. 90 tablet 1  . multivitamin (THERAGRAN) per tablet Take 1 tablet  by mouth daily. ( with B - Complex )    . Omega-3 Fatty Acids (FISH OIL PO) Take 1 capsule by mouth daily. ( Mega Red )    . potassium chloride SA (K-DUR,KLOR-CON) 20 MEQ tablet Take 1 tablet (20 mEq total) by mouth daily. (Patient taking differently: Take 10 mEq by mouth daily. ) 30 tablet 6  . solifenacin (VESICARE) 5 MG tablet Take 5 mg by mouth daily.    Marland Kitchen warfarin (COUMADIN) 5 MG tablet Take 1 tablet (5 mg total) by mouth as directed. (Patient taking differently: Take 2.5-5 mg by mouth daily. 5mg  daily on Monday Wednesday and Friday, 2.5mg  daily all other days) 30 tablet 3  . ZETIA 10 MG tablet TAKE 1 TABLET BY MOUTH ONCE DAILY 30 tablet 5   Radiology: Ct Head Wo Contrast  02/08/2014   CLINICAL DATA:  Left-sided weakness. , fall. Slurred speech. Prior stroke.  EXAM: CT HEAD WITHOUT CONTRAST  TECHNIQUE: Contiguous axial images were obtained from the base of the skull through the vertex without intravenous contrast.  COMPARISON:  02/06/2013  FINDINGS: Old right cerebellar infarct, stable. Diffuse age related volume loss/atrophy. No acute intracranial abnormality. Specifically, no hemorrhage, hydrocephalus, mass lesion, acute infarction, or significant intracranial injury. No acute calvarial abnormality. Visualized paranasal sinuses and mastoids clear. Orbital soft tissues unremarkable.  IMPRESSION: No acute intracranial abnormality.   Electronically Signed   By: Rolm Baptise M.D.   On: 02/08/2014 14:28    EKG:  afib rate 70 LAD poor R wave progression no change from 01/24/14   ASSESSMENT AND PLAN:  Afib:  Rate control is fine agree with low target INR range given age and epistaxis.   Diastolic CHF:  Given pre renal azotemia  consider using just lasix and d/c HCTZ Despite BNP being up he appears euvolemic Weakness:  Per primary service r/o UTI no fever or focal symptoms check cortisol and TSH CVA:  Previous history of with mild left sided weakness CT negative coumadin fairly Rx on arrival MRI ordered by primary service Prostate CA:  Does not appear to have a lot of pain from bone mets   HTN:  Continue ARB/diuretic  Increase amlodipine to 5 mg for now  Cholesterol:  On statin   Discussed with Dr Mare Ferrari who is on this weekend and will see him  ? Code Status  Signed: [place] 02/08/2014, 5:52 PM

## 2014-02-08 NOTE — Progress Notes (Signed)
ANTICOAGULATION CONSULT NOTE - Follow Up Consult  Pharmacy Consult for Coumadin Indication: atrial fibrillation  Allergies  Allergen Reactions  . Pravachol     Muscle weakness  . Zocor [Simvastatin - High Dose]     Muscle weakness    Patient Measurements:   Heparin Dosing Weight:   Vital Signs: Temp: 97.6 F (36.4 C) (12/18 1050) Temp Source: Oral (12/18 1050) BP: 164/71 mmHg (12/18 1715) Pulse Rate: 61 (12/18 1715)  Labs:  Recent Labs  02/08/14 1148  HGB 13.5  HCT 41.6  PLT 215  LABPROT 22.2*  INR 1.93*  CREATININE 1.33    CrCl cannot be calculated (Unknown ideal weight.).   Medications:   (Not in a hospital admission) Scheduled:  Infusions:   Assessment: 80 yo who was admitted for left sided weakness. He also has a hx of afib and is on chronic coumadin. Cards goal of INR is lower for his case.  INR on admission is right at his goal.   PTA coumadin = 2.5mg  qday except 5mg  MWF  Goal of Therapy:  INR 1.9-2.2 Monitor platelets by anticoagulation protocol: Yes   Plan:   Cont coumadin 2.5mg  qday except 5mg  MWF Daily INR for now  [name], PharmD Pager: [phone] 02/08/2014 5:58 PM

## 2014-02-09 ENCOUNTER — Inpatient Hospital Stay (HOSPITAL_COMMUNITY): Payer: Medicare Other

## 2014-02-09 DIAGNOSIS — M5442 Lumbago with sciatica, left side: Secondary | ICD-10-CM

## 2014-02-09 DIAGNOSIS — E780 Pure hypercholesterolemia: Secondary | ICD-10-CM

## 2014-02-09 DIAGNOSIS — R531 Weakness: Secondary | ICD-10-CM

## 2014-02-09 DIAGNOSIS — R799 Abnormal finding of blood chemistry, unspecified: Secondary | ICD-10-CM

## 2014-02-09 DIAGNOSIS — M549 Dorsalgia, unspecified: Secondary | ICD-10-CM | POA: Diagnosis present

## 2014-02-09 DIAGNOSIS — N184 Chronic kidney disease, stage 4 (severe): Secondary | ICD-10-CM

## 2014-02-09 LAB — COMPREHENSIVE METABOLIC PANEL
ALK PHOS: 62 U/L (ref 39–117)
ALT: 13 U/L (ref 0–53)
ANION GAP: 13 (ref 5–15)
AST: 14 U/L (ref 0–37)
Albumin: 2.9 g/dL — ABNORMAL LOW (ref 3.5–5.2)
BILIRUBIN TOTAL: 0.8 mg/dL (ref 0.3–1.2)
BUN: 63 mg/dL — AB (ref 6–23)
CHLORIDE: 100 meq/L (ref 96–112)
CO2: 30 meq/L (ref 19–32)
Calcium: 9.9 mg/dL (ref 8.4–10.5)
Creatinine, Ser: 1.46 mg/dL — ABNORMAL HIGH (ref 0.50–1.35)
GFR, EST AFRICAN AMERICAN: 48 mL/min — AB (ref >90)
GFR, EST NON AFRICAN AMERICAN: 41 mL/min — AB (ref >90)
GLUCOSE: 99 mg/dL (ref 70–99)
POTASSIUM: 3.5 meq/L — AB (ref 3.7–5.3)
SODIUM: 143 meq/L (ref 137–147)
Total Protein: 7 g/dL (ref 6.0–8.3)

## 2014-02-09 LAB — PROTIME-INR
INR: 1.92 — ABNORMAL HIGH (ref 0.00–1.49)
Prothrombin Time: 22.2 seconds — ABNORMAL HIGH (ref 11.6–15.2)

## 2014-02-09 LAB — TROPONIN I
Troponin I: <0.3 ng/mL (ref <0.30)
Troponin I: <0.3 ng/mL (ref <0.30)

## 2014-02-09 LAB — CBC
HCT: 41.2 % (ref 39.0–52.0)
HEMOGLOBIN: 13.7 g/dL (ref 13.0–17.0)
MCH: 33.3 pg (ref 26.0–34.0)
MCHC: 33.3 g/dL (ref 30.0–36.0)
MCV: 100 fL (ref 78.0–100.0)
Platelets: 219 10*3/uL (ref 150–400)
RBC: 4.12 MIL/uL — ABNORMAL LOW (ref 4.22–5.81)
RDW: 13.8 % (ref 11.5–15.5)
WBC: 9.3 10*3/uL (ref 4.0–10.5)

## 2014-02-09 MED ORDER — AMLODIPINE BESYLATE 5 MG PO TABS
5.0000 mg | ORAL_TABLET | Freq: Every day | ORAL | Status: DC
  Administered 2014-02-09 – 2014-02-13 (×5): 5 mg via ORAL
  Filled 2014-02-09 (×5): qty 1

## 2014-02-09 MED ORDER — ACETAMINOPHEN 500 MG PO TABS
500.0000 mg | ORAL_TABLET | Freq: Three times a day (TID) | ORAL | Status: DC
  Administered 2014-02-09 – 2014-02-13 (×13): 500 mg via ORAL
  Filled 2014-02-09 (×17): qty 1

## 2014-02-09 MED ORDER — PNEUMOCOCCAL VAC POLYVALENT 25 MCG/0.5ML IJ INJ
0.5000 mL | INJECTION | INTRAMUSCULAR | Status: AC
  Administered 2014-02-10: 0.5 mL via INTRAMUSCULAR
  Filled 2014-02-09: qty 0.5

## 2014-02-09 MED ORDER — CYCLOBENZAPRINE HCL 10 MG PO TABS
5.0000 mg | ORAL_TABLET | Freq: Three times a day (TID) | ORAL | Status: DC | PRN
Start: 2014-02-09 — End: 2014-02-12

## 2014-02-09 MED ORDER — CYCLOBENZAPRINE HCL 5 MG PO TABS
5.0000 mg | ORAL_TABLET | Freq: Three times a day (TID) | ORAL | Status: DC
  Administered 2014-02-09: 5 mg via ORAL
  Filled 2014-02-09 (×3): qty 1

## 2014-02-09 MED ORDER — OXYCODONE-ACETAMINOPHEN 5-325 MG PO TABS
1.0000 | ORAL_TABLET | Freq: Four times a day (QID) | ORAL | Status: DC | PRN
Start: 2014-02-09 — End: 2014-02-13

## 2014-02-09 MED ORDER — OXYCODONE-ACETAMINOPHEN 5-325 MG PO TABS
1.0000 | ORAL_TABLET | Freq: Four times a day (QID) | ORAL | Status: DC | PRN
Start: 2014-02-09 — End: 2014-02-09

## 2014-02-09 MED ORDER — POTASSIUM CHLORIDE CRYS ER 20 MEQ PO TBCR
40.0000 meq | EXTENDED_RELEASE_TABLET | Freq: Once | ORAL | Status: AC
  Administered 2014-02-09: 40 meq via ORAL
  Filled 2014-02-09: qty 2

## 2014-02-09 MED ORDER — BOOST PLUS PO LIQD
237.0000 mL | Freq: Three times a day (TID) | ORAL | Status: DC
  Administered 2014-02-09 – 2014-02-10 (×2): 237 mL via ORAL
  Filled 2014-02-09 (×9): qty 237

## 2014-02-09 NOTE — Progress Notes (Signed)
Pt HR drop to 35 non-sustained. Currently sustaining in 1s. Pt asleep at time. MD on call [facility] notified. No new orders at this time.

## 2014-02-09 NOTE — Progress Notes (Signed)
ANTICOAGULATION CONSULT NOTE - Follow Up Consult  Pharmacy Consult for Coumadin Indication: Afib, hx CVA  Allergies  Allergen Reactions  . Pravachol     Muscle weakness  . Zocor [Simvastatin - High Dose]     Muscle weakness    Patient Measurements: Height: 6' (182.9 cm) Weight: 175 lb 3.2 oz (79.47 kg) IBW/kg (Calculated) : 77.6 Heparin Dosing Weight:   Vital Signs: Temp: 97 F (36.1 C) (12/19 0529) Temp Source: Axillary (12/19 0529) BP: 156/67 mmHg (12/19 0934) Pulse Rate: 72 (12/19 0934)  Labs:  Recent Labs  02/08/14 1148 02/08/14 1745 02/08/14 2310 02/09/14 0308  HGB 13.5  --   --  13.7  HCT 41.6  --   --  41.2  PLT 215  --   --  219  LABPROT 22.2*  --   --  22.2*  INR 1.93*  --   --  1.92*  CREATININE 1.33  --   --  1.46*  TROPONINI  --  <0.30 <0.30 <0.30    Estimated Creatinine Clearance: 39.1 mL/min (by C-G formula based on Cr of 1.46).  Assessment: 89yom continues on chronic Coumadin for AFib. INR (1.92) remains therapeutic with reduced INR goal (per Coumadin Clinic notes, INR goal 1.8-2.2).  - H/H and Plts stable - No significant bleeding reported - PTA regimen: 2.5mg  daily except 5mg  MWF  Goal of Therapy:  INR 1.8-2.2   Plan:  1. Continue Coumadin 2.5mg  qday except 5mg  MWF 2. Daily INR  Earleen Newport  [phone] 02/09/2014,12:24 PM

## 2014-02-09 NOTE — Progress Notes (Signed)
PT Cancellation Note  Patient Details Name: Craig [name]. MRN: [id number] DOB: 04/25/1924   Cancelled Treatment:    Reason Eval/Treat Not Completed: Medical issues which prohibited therapy.  Received PT discontinue order - to hold PT until MRI spine addressed.  MD:  Please reorder PT when appropriate for patient.  Thank you!   Despina Pole 02/09/2014, 12:54 PM Carita Pian. Sanjuana Kava, [place] Pager [phone]

## 2014-02-09 NOTE — Progress Notes (Signed)
Patient's wife concerned as patient drowsy but easily arousable to verbal stimulus after taking first dose of scheduled cyclobenzaprine.  Dr. Tana Coast made aware, verbal order given to change schedule to three time daily PRN.  Patient and wife updated, will continue to monitor.

## 2014-02-09 NOTE — Progress Notes (Signed)
Patient ID: Craig [name]  male  GLO:[id number]    DOB: 1924-07-17    DOA: 02/08/2014  PCP: [name], MD  Brief history of present illness  Patient is a 80 year old male, retired [employer], with hypertension, atrial fibrillation, prior history of stroke presented with left-sided weakness that started on the morning of admission. Per his wife, patient fell on December 8, hit his back and has hematoma in the right forearm. Last Saturday, 7 days ago he fell 3 times, hit his left knee. Subsequently he saw his orthopedic physician, x-ray of his left knee and hip were negative. Patient had steroid injection in the left knee. After the injection he was doing better and was able to use his walker. A day prior to admission patient was noticed to be sluggish and woke up with left-sided weakness difficulty with ambulation. Otherwise no acute slurred speech, no vision changes confusion or any focal neurological deficits. Patient was on Casodex and chemotherapy for prostrate cancer which was stopped last week due to concerns of causing weakness.  Assessment/Plan: Principal Problem:   Weakness with left sided weakness: The weakness appears to be more due to pain in the lower back radiating to the left hip and thigh - MRI of the brain was done which showed remote cerebellar infarct and cortical infarct in the right frontal lobe however no new acute infarct. - CT of the left hip was negative for any fracture or dislocation.  - On examination, patient had difficulty lifting up his left leg due to pain in his lower back and thigh, hence MRI of the thoracic spine and lumbar spine is ordered. Given his history of prostrate cancer, patient had bone scan done in 2014 which had shown increased uptake in T9.  - will hold PT OT evaluation until MRI results are back. -  Placed on Flexeril, scheduled Tylenol, Percocet as needed   Active Problems: Elevated BNP - Currently euvolemic, follow 2-D echo,  cardiology consulted, continue home dose of Lasix    Atrial fibrillation - Continue be supraglottic, Coumadin per pharmacy    Hypercholesterolemia - Continue statin    Prostate cancer - Currently chemotherapy on hold, defer to outpatient oncologist, Dr. Alen Blew  Hypokalemia - replaced   DVT Prophylaxis:   Code Status:  Family Communication: Discussed in detail with patient's wife at the bedside  Disposition:   Consultants:  Cardiology   Procedures:  None   Antibiotics:  None     Subjective: Patient seen and examined, still has low back pain, radiating to left thigh and leg , has chronically slightly slurred speech from prior strokes, wife at the bedside  Objective: Weight change:   Intake/Output Summary (Last 24 hours) at 02/09/14 1103 Last data filed at 02/09/14 0940  Gross per 24 hour  Intake   1203 ml  Output    975 ml  Net    228 ml   Blood pressure 156/67, pulse 72, temperature 97 F (36.1 C), temperature source Axillary, resp. rate 18, height 6' (1.829 m), weight 79.47 kg (175 lb 3.2 oz), SpO2 98 %.  Physical Exam: General: Alert and awake, oriented x3, not in any acute distress. CVS: Irregularly regular, 2/6 systolic murmur Chest: clear to auscultation bilaterally, no wheezing, rales or rhonchi Abdomen: soft nontender, nondistended, normal bowel sounds  Extremities: no cyanosis, clubbing or edema noted bilaterally Neuro: Cranial nerves II-XII intact, no focal neurological deficitsThe patient was able to lift up both of his legs however had difficulty lifting the  left leg due to pain in his left thigh and hip   Lab Results: Basic Metabolic Panel:  Recent Labs Lab 02/08/14 1148 02/09/14 0308  NA 143 143  K 4.2 3.5*  CL 100 100  CO2 29 30  GLUCOSE 114* 99  BUN 71* 63*  CREATININE 1.33 1.46*  CALCIUM 10.3 9.9   Liver Function Tests:  Recent Labs Lab 02/09/14 0308  AST 14  ALT 13  ALKPHOS 62  BILITOT 0.8  PROT 7.0  ALBUMIN 2.9*    No results for input(s): LIPASE, AMYLASE in the last 168 hours. No results for input(s): AMMONIA in the last 168 hours. CBC:  Recent Labs Lab 02/08/14 1148 02/09/14 0308  WBC 9.9 9.3  NEUTROABS 7.1  --   HGB 13.5 13.7  HCT 41.6 41.2  MCV 97.9 100.0  PLT 215 219   Cardiac Enzymes:  Recent Labs Lab 02/08/14 1745 02/08/14 2310 02/09/14 0308  TROPONINI <0.30 <0.30 <0.30   BNP: Invalid input(s): POCBNP CBG: No results for input(s): GLUCAP in the last 168 hours.   Micro Results: No results found for this or any previous visit (from the past 240 hour(s)).  Studies/Results: Dg Chest 2 View  02/09/2014   CLINICAL DATA:  Elevated B-type natriuretic peptide level. Initial encounter.  EXAM: CHEST  2 VIEW  COMPARISON:  Chest radiograph from 09/27/2012  FINDINGS: The lungs are well-aerated. Minimal left basilar atelectasis is noted. There is no evidence of pleural effusion or pneumothorax.  The heart is enlarged. No acute osseous abnormalities are seen. The patient is status post vertebroplasty at the lower thoracic spine.  IMPRESSION: Minimal left basilar atelectasis noted; lungs otherwise clear. Cardiomegaly.   Electronically Signed   By: Garald Balding M.D.   On: 02/09/2014 03:52   Ct Head Wo Contrast  02/08/2014   CLINICAL DATA:  Left-sided weakness. , fall. Slurred speech. Prior stroke.  EXAM: CT HEAD WITHOUT CONTRAST  TECHNIQUE: Contiguous axial images were obtained from the base of the skull through the vertex without intravenous contrast.  COMPARISON:  02/06/2013  FINDINGS: Old right cerebellar infarct, stable. Diffuse age related volume loss/atrophy. No acute intracranial abnormality. Specifically, no hemorrhage, hydrocephalus, mass lesion, acute infarction, or significant intracranial injury. No acute calvarial abnormality. Visualized paranasal sinuses and mastoids clear. Orbital soft tissues unremarkable.  IMPRESSION: No acute intracranial abnormality.   Electronically Signed    By: Rolm Baptise M.D.   On: 02/08/2014 14:28   Mr Brain Wo Contrast  02/09/2014   CLINICAL DATA:  Initial evaluation for left-sided weakness.  EXAM: MRI HEAD WITHOUT CONTRAST  TECHNIQUE: Multiplanar, multiecho pulse sequences of the brain and surrounding structures were obtained without intravenous contrast.  COMPARISON:  Prior CT from 02/08/2014.  FINDINGS: Diffuse prominence of the CSF containing spaces is compatible with age-related generalized cerebral atrophy. Mild patchy T2/FLAIR hyperintensity seen within the periventricular and deep white matter both cerebral hemispheres, nonspecific, but likely related to mild chronic small vessel ischemic changes.  Remote bilateral cerebellar infarcts present, right larger than left. There are associated chronic blood products with the right cerebellar infarct. Additional remote infarcts seen within the high and posterior right frontal lobe. Moderate wallerian degeneration noted within the right middle cerebellar peduncle.  No abnormal foci of restricted diffusion to suggest acute intracranial infarct identified. Gray-white matter differentiation maintained. The right vertebral artery is likely occluded. Otherwise, normal intravascular flow voids present. No acute intracranial hemorrhage.  No mass lesion, midline shift, or mass effect. Ventricles within normal limits without evidence  of hydrocephalus. No extra-axial fluid collection.  Craniocervical junction within normal limits. Incidental note made of a partially empty sella. No acute abnormality seen about the orbits.  Paranasal sinuses and mastoid air cells are clear.  IMPRESSION: 1. No acute intracranial infarct or other process identified. 2. Remote bilateral cerebellar infarcts, right greater than left, with associated chronic blood products/hemorrhage in the inferior right cerebellar hemisphere. 3. Remote cortical infarct within the high posterior right frontal lobe. 4. Moderate wallerian degeneration within  the right middle cerebellar peduncle. 5. Atrophy with very mild chronic small vessel ischemic changes.   Electronically Signed   By: Jeannine Boga M.D.   On: 02/09/2014 03:14   Ct Hip Left Wo Contrast  02/09/2014   CLINICAL DATA:  Acute onset of left-sided weakness and left hip pain. Four recent falls. Difficulty ambulating. Initial encounter.  EXAM: CT OF THE LEFT HIP WITHOUT CONTRAST  TECHNIQUE: Multidetector CT imaging of the left hip was performed according to the standard protocol. Multiplanar CT image reconstructions were also generated.  COMPARISON:  CT of the abdomen and pelvis performed 04/27/2009  FINDINGS: There is no evidence of fracture or dislocation. Prominent bone islands are again noted at the left femoral head and neck. The left femoral head remains seated at the acetabulum. The left sacroiliac joint is unremarkable in appearance.  Mild focal bony expansion at the left inferior pubic ramus appears to reflect chronic sequelae of a remote lytic lesion at this location. Mild degenerative change is noted at the lower lumbar spine.  There is focal dilatation of the distal abdominal aorta to 3.4 cm in AP dimension and 4.7 cm in transverse dimension. Diffuse calcification is seen along the distal abdominal aorta and its branches. The prostate appears mildly enlarged. No additional soft tissue abnormalities are seen. Visualized small and large bowel loops are grossly unremarkable.  IMPRESSION: 1. No evidence of fracture or dislocation. 2. Mild focal bony expansion at the left inferior pubic ramus reflects chronic sequelae of a remote lytic lesion at this location; there has been interval peripheral ossification, without evidence of a recurrent mass. 3. Focal dilatation of the distal abdominal aorta to 3.4 cm in AP dimension and 4.7 cm in transverse dimension, increased in size from 2011. 4. Diffuse calcification along the distal abdominal aorta and its branches. 5. Mildly enlarged prostate.    Electronically Signed   By: Garald Balding M.D.   On: 02/09/2014 04:08    Medications: Scheduled Meds: . acetaminophen  500 mg Oral BID  . amLODipine  5 mg Oral Daily  . aspirin  81 mg Oral Daily  . atorvastatin  10 mg Oral Daily  . B-complex with vitamin C  1 tablet Oral Daily  . bisoprolol-hydrochlorothiazide  1 tablet Oral Daily  . cyclobenzaprine  5 mg Oral TID  . darifenacin  7.5 mg Oral Daily  . docusate sodium  100 mg Oral BID  . ezetimibe  10 mg Oral Daily  . furosemide  20 mg Oral Daily  . lactose free nutrition  237 mL Oral TID WC  . multivitamin with minerals  1 tablet Oral Daily  . omega-3 acid ethyl esters  1 g Oral Daily  . potassium chloride SA  10 mEq Oral Daily  . sodium chloride  3 mL Intravenous Q12H  . sodium chloride  3 mL Intravenous Q12H  . warfarin  2.5 mg Oral Once per day on Sun Tue Thu Sat  . warfarin  5 mg Oral Once per day on  Mon Wed Fri  . Warfarin - Pharmacist Dosing Inpatient   Does not apply q1800      LOS: 1 day   Arlan Birks M.D. [facility] 02/09/2014, 11:03 AM Pager: [phone]  If 7PM-7AM, please contact night-coverage [email or web address] Password [email or web address]

## 2014-02-09 NOTE — Progress Notes (Signed)
Patient's wife had questions about PT/OT orders.  Dr. Tana Coast notified that MRI results back.  Dr. Tana Coast discussed results with patient's wife over phone.  Patient asleep, resting comfortably at this time.  Patient's wife denies any questions or concerns at this time.  Will continue to monitor.

## 2014-02-09 NOTE — Progress Notes (Signed)
Patient Name: Craig [name]. Date of Encounter: 02/09/2014     Active Problems:   Atrial fibrillation   Hypercholesterolemia   Chronic kidney disease (CKD), stage IV (severe)   Fluid overload   Left-sided weakness    SUBJECTIVE  The patient had a good night.  No chest pain or shortness of breath.  He is in chronic atrial fibrillation.  Heart rate during sleep drops as low as 35 briefly.  Asymptomatic.  Systolic blood pressure remains elevated.  CURRENT MEDS . acetaminophen  500 mg Oral BID  . amLODipine  2.5 mg Oral Daily  . aspirin  81 mg Oral Daily  . atorvastatin  10 mg Oral Daily  . B-complex with vitamin C  1 tablet Oral Daily  . bisoprolol-hydrochlorothiazide  1 tablet Oral Daily  . darifenacin  7.5 mg Oral Daily  . docusate sodium  100 mg Oral BID  . ezetimibe  10 mg Oral Daily  . furosemide  20 mg Oral Daily  . multivitamin with minerals  1 tablet Oral Daily  . omega-3 acid ethyl esters  1 g Oral Daily  . potassium chloride SA  10 mEq Oral Daily  . sodium chloride  3 mL Intravenous Q12H  . sodium chloride  3 mL Intravenous Q12H  . warfarin  2.5 mg Oral Once per day on Sun Tue Thu Sat  . warfarin  5 mg Oral Once per day on Mon Wed Fri  . Warfarin - Pharmacist Dosing Inpatient   Does not apply q1800    OBJECTIVE  Filed Vitals:   02/08/14 2238 02/09/14 0136 02/09/14 0310 02/09/14 0529  BP: 160/70 168/79 150/72 170/93  Pulse:  51  72  Temp:    97 F (36.1 C)  TempSrc:    Axillary  Resp:  18  18  Weight:    175 lb 3.2 oz (79.47 kg)  SpO2:  99%  99%    Intake/Output Summary (Last 24 hours) at 02/09/14 0745 Last data filed at 02/09/14 0552  Gross per 24 hour  Intake    600 ml  Output    900 ml  Net   -300 ml   Filed Weights   02/09/14 0529  Weight: 175 lb 3.2 oz (79.47 kg)    PHYSICAL EXAM  General: Pleasant, NAD.  Chronically slurred speech following prior strokes. Neuro: Alert and oriented X 3. Moves all extremities  spontaneously. Psych: Normal affect. HEENT:  Normal  Neck: Supple without bruits or JVD. Lungs:  Resp regular and unlabored, CTA. Heart: Irregular.  no s3, s4, there is a grade 2/6 systolic murmur at the base. Abdomen: Soft, non-tender, non-distended, BS + x 4.  Extremities: No clubbing, cyanosis or edema. DP/PT/Radials 2+ and equal bilaterally.  Accessory Clinical Findings  CBC  Recent Labs  02/08/14 1148 02/09/14 0308  WBC 9.9 9.3  NEUTROABS 7.1  --   HGB 13.5 13.7  HCT 41.6 41.2  MCV 97.9 100.0  PLT 215 332   Basic Metabolic Panel  Recent Labs  02/08/14 1148 02/09/14 0308  NA 143 143  K 4.2 3.5*  CL 100 100  CO2 29 30  GLUCOSE 114* 99  BUN 71* 63*  CREATININE 1.33 1.46*  CALCIUM 10.3 9.9   Liver Function Tests  Recent Labs  02/09/14 0308  AST 14  ALT 13  ALKPHOS 62  BILITOT 0.8  PROT 7.0  ALBUMIN 2.9*   No results for input(s): LIPASE, AMYLASE in the last 72 hours. Cardiac Enzymes  Recent Labs  02/08/14 1745 02/08/14 2310 02/09/14 0308  TROPONINI <0.30 <0.30 <0.30   BNP Invalid input(s): POCBNP D-Dimer No results for input(s): DDIMER in the last 72 hours. Hemoglobin A1C No results for input(s): HGBA1C in the last 72 hours. Fasting Lipid Panel No results for input(s): CHOL, HDL, LDLCALC, TRIG, CHOLHDL, LDLDIRECT in the last 72 hours. Thyroid Function Tests No results for input(s): TSH, T4TOTAL, T3FREE, THYROIDAB in the last 72 hours.  Invalid input(s): FREET3  TELE  Atrial fibrillation with slow ventricular response  ECG    Radiology/Studies  Dg Chest 2 View  02/09/2014   CLINICAL DATA:  Elevated B-type natriuretic peptide level. Initial encounter.  EXAM: CHEST  2 VIEW  COMPARISON:  Chest radiograph from 09/27/2012  FINDINGS: The lungs are well-aerated. Minimal left basilar atelectasis is noted. There is no evidence of pleural effusion or pneumothorax.  The heart is enlarged. No acute osseous abnormalities are seen. The patient  is status post vertebroplasty at the lower thoracic spine.  IMPRESSION: Minimal left basilar atelectasis noted; lungs otherwise clear. Cardiomegaly.   Electronically Signed   By: Garald Balding M.D.   On: 02/09/2014 03:52   Ct Head Wo Contrast  02/08/2014   CLINICAL DATA:  Left-sided weakness. , fall. Slurred speech. Prior stroke.  EXAM: CT HEAD WITHOUT CONTRAST  TECHNIQUE: Contiguous axial images were obtained from the base of the skull through the vertex without intravenous contrast.  COMPARISON:  02/06/2013  FINDINGS: Old right cerebellar infarct, stable. Diffuse age related volume loss/atrophy. No acute intracranial abnormality. Specifically, no hemorrhage, hydrocephalus, mass lesion, acute infarction, or significant intracranial injury. No acute calvarial abnormality. Visualized paranasal sinuses and mastoids clear. Orbital soft tissues unremarkable.  IMPRESSION: No acute intracranial abnormality.   Electronically Signed   By: Rolm Baptise M.D.   On: 02/08/2014 14:28   Mr Brain Wo Contrast  02/09/2014   CLINICAL DATA:  Initial evaluation for left-sided weakness.  EXAM: MRI HEAD WITHOUT CONTRAST  TECHNIQUE: Multiplanar, multiecho pulse sequences of the brain and surrounding structures were obtained without intravenous contrast.  COMPARISON:  Prior CT from 02/08/2014.  FINDINGS: Diffuse prominence of the CSF containing spaces is compatible with age-related generalized cerebral atrophy. Mild patchy T2/FLAIR hyperintensity seen within the periventricular and deep white matter both cerebral hemispheres, nonspecific, but likely related to mild chronic small vessel ischemic changes.  Remote bilateral cerebellar infarcts present, right larger than left. There are associated chronic blood products with the right cerebellar infarct. Additional remote infarcts seen within the high and posterior right frontal lobe. Moderate wallerian degeneration noted within the right middle cerebellar peduncle.  No abnormal foci  of restricted diffusion to suggest acute intracranial infarct identified. Gray-white matter differentiation maintained. The right vertebral artery is likely occluded. Otherwise, normal intravascular flow voids present. No acute intracranial hemorrhage.  No mass lesion, midline shift, or mass effect. Ventricles within normal limits without evidence of hydrocephalus. No extra-axial fluid collection.  Craniocervical junction within normal limits. Incidental note made of a partially empty sella. No acute abnormality seen about the orbits.  Paranasal sinuses and mastoid air cells are clear.  IMPRESSION: 1. No acute intracranial infarct or other process identified. 2. Remote bilateral cerebellar infarcts, right greater than left, with associated chronic blood products/hemorrhage in the inferior right cerebellar hemisphere. 3. Remote cortical infarct within the high posterior right frontal lobe. 4. Moderate wallerian degeneration within the right middle cerebellar peduncle. 5. Atrophy with very mild chronic small vessel ischemic changes.   Electronically Signed  By: Jeannine Boga M.D.   On: 02/09/2014 03:14   Ct Hip Left Wo Contrast  02/09/2014   CLINICAL DATA:  Acute onset of left-sided weakness and left hip pain. Four recent falls. Difficulty ambulating. Initial encounter.  EXAM: CT OF THE LEFT HIP WITHOUT CONTRAST  TECHNIQUE: Multidetector CT imaging of the left hip was performed according to the standard protocol. Multiplanar CT image reconstructions were also generated.  COMPARISON:  CT of the abdomen and pelvis performed 04/27/2009  FINDINGS: There is no evidence of fracture or dislocation. Prominent bone islands are again noted at the left femoral head and neck. The left femoral head remains seated at the acetabulum. The left sacroiliac joint is unremarkable in appearance.  Mild focal bony expansion at the left inferior pubic ramus appears to reflect chronic sequelae of a remote lytic lesion at this  location. Mild degenerative change is noted at the lower lumbar spine.  There is focal dilatation of the distal abdominal aorta to 3.4 cm in AP dimension and 4.7 cm in transverse dimension. Diffuse calcification is seen along the distal abdominal aorta and its branches. The prostate appears mildly enlarged. No additional soft tissue abnormalities are seen. Visualized small and large bowel loops are grossly unremarkable.  IMPRESSION: 1. No evidence of fracture or dislocation. 2. Mild focal bony expansion at the left inferior pubic ramus reflects chronic sequelae of a remote lytic lesion at this location; there has been interval peripheral ossification, without evidence of a recurrent mass. 3. Focal dilatation of the distal abdominal aorta to 3.4 cm in AP dimension and 4.7 cm in transverse dimension, increased in size from 2011. 4. Diffuse calcification along the distal abdominal aorta and its branches. 5. Mildly enlarged prostate.   Electronically Signed   By: Garald Balding M.D.   On: 02/09/2014 04:08    ASSESSMENT AND PLAN  1.  Atrial fibrillation.  Continue chronic warfarin.  Rate control is adequate. 2.  Hypertensive cardiovascular disease.  Currently on amlodipine 2.5 mg daily.  Will increase to 5 mg daily for systolic hypertension. 3.  Old CVA.  MRI of brain shows no new lesions. 4.  Prostate cancer 5.  Diastolic heart failure.  Appears euvolemic. 6.  Hypokalemia.  Repletion per primary service  Signed, [name] MD

## 2014-02-09 NOTE — Progress Notes (Signed)
Dr. Mare Ferrari notified of Pt HR drop to 35 and BP elevated to 160-170s. Ordered to continue to monitor. Ronnette Hila, RN

## 2014-02-10 DIAGNOSIS — M545 Low back pain: Secondary | ICD-10-CM | POA: Diagnosis not present

## 2014-02-10 LAB — BASIC METABOLIC PANEL
ANION GAP: 12 (ref 5–15)
BUN: 63 mg/dL — ABNORMAL HIGH (ref 6–23)
CHLORIDE: 98 meq/L (ref 96–112)
CO2: 27 mEq/L (ref 19–32)
CREATININE: 1.61 mg/dL — AB (ref 0.50–1.35)
Calcium: 9.5 mg/dL (ref 8.4–10.5)
GFR calc Af Amer: 43 mL/min — ABNORMAL LOW (ref >90)
GFR, EST NON AFRICAN AMERICAN: 37 mL/min — AB (ref >90)
Glucose, Bld: 121 mg/dL — ABNORMAL HIGH (ref 70–99)
Potassium: 4 mEq/L (ref 3.7–5.3)
Sodium: 137 mEq/L (ref 137–147)

## 2014-02-10 LAB — CBC
HCT: 37.9 % — ABNORMAL LOW (ref 39.0–52.0)
HEMOGLOBIN: 12.4 g/dL — AB (ref 13.0–17.0)
MCH: 32.5 pg (ref 26.0–34.0)
MCHC: 32.7 g/dL (ref 30.0–36.0)
MCV: 99.5 fL (ref 78.0–100.0)
Platelets: 204 10*3/uL (ref 150–400)
RBC: 3.81 MIL/uL — ABNORMAL LOW (ref 4.22–5.81)
RDW: 13.6 % (ref 11.5–15.5)
WBC: 13.2 10*3/uL — ABNORMAL HIGH (ref 4.0–10.5)

## 2014-02-10 LAB — PROTIME-INR
INR: 2.13 — ABNORMAL HIGH (ref 0.00–1.49)
Prothrombin Time: 24 seconds — ABNORMAL HIGH (ref 11.6–15.2)

## 2014-02-10 MED ORDER — DEXAMETHASONE SODIUM PHOSPHATE 4 MG/ML IJ SOLN
4.0000 mg | Freq: Four times a day (QID) | INTRAMUSCULAR | Status: DC
  Administered 2014-02-10 – 2014-02-13 (×13): 4 mg via INTRAVENOUS
  Filled 2014-02-10 (×17): qty 1

## 2014-02-10 MED ORDER — TRAMADOL HCL 50 MG PO TABS
50.0000 mg | ORAL_TABLET | Freq: Two times a day (BID) | ORAL | Status: DC
  Administered 2014-02-10: 50 mg via ORAL
  Filled 2014-02-10: qty 1

## 2014-02-10 MED ORDER — BOOST PLUS PO LIQD
237.0000 mL | ORAL | Status: DC
  Administered 2014-02-11: 237 mL via ORAL
  Filled 2014-02-10 (×2): qty 237

## 2014-02-10 NOTE — Progress Notes (Signed)
Patient Name: Craig [name]. Date of Encounter: 02/10/2014     Principal Problem:   Weakness Active Problems:   Atrial fibrillation   Hypercholesterolemia   Prostate cancer   Chronic kidney disease (CKD), stage IV (severe)   Fluid overload   Left-sided weakness   Back pain    SUBJECTIVE  The patient feels well today.  He is much more alert.  He did not tolerate the Flexeril yesterday.  It made him very sedated. He denies any chest discomfort or increased dyspnea.  Weight indicated to be up 12 pounds is probably spurious.  He is not having any orthopnea or peripheral edema  CURRENT MEDS . acetaminophen  500 mg Oral TID  . amLODipine  5 mg Oral Daily  . aspirin  81 mg Oral Daily  . atorvastatin  10 mg Oral Daily  . B-complex with vitamin C  1 tablet Oral Daily  . docusate sodium  100 mg Oral BID  . ezetimibe  10 mg Oral Daily  . [START ON 02/11/2014] lactose free nutrition  237 mL Oral BH-q7a  . multivitamin with minerals  1 tablet Oral Daily  . omega-3 acid ethyl esters  1 g Oral Daily  . pneumococcal 23 valent vaccine  0.5 mL Intramuscular Tomorrow-1000  . potassium chloride SA  10 mEq Oral Daily  . sodium chloride  3 mL Intravenous Q12H  . sodium chloride  3 mL Intravenous Q12H  . traMADol  50 mg Oral Q12H  . warfarin  2.5 mg Oral Once per day on Sun Tue Thu Sat  . warfarin  5 mg Oral Once per day on Mon Wed Fri  . Warfarin - Pharmacist Dosing Inpatient   Does not apply q1800    OBJECTIVE  Filed Vitals:   02/09/14 1430 02/09/14 2017 02/10/14 0217 02/10/14 0618  BP: 134/79 176/58 131/57 166/75  Pulse: 92 60 67 56  Temp: 97.9 F (36.6 C) 98.3 F (36.8 C) 97.9 F (36.6 C) 97.5 F (36.4 C)  TempSrc: Oral Oral Oral Oral  Resp: 18 18 18 18   Height:      Weight:    187 lb 6.3 oz (85 kg)  SpO2: 96% 95% 94%     Intake/Output Summary (Last 24 hours) at 02/10/14 0913 Last data filed at 02/10/14 0840  Gross per 24 hour  Intake   1422 ml  Output    725  ml  Net    697 ml   Filed Weights   02/09/14 0529 02/10/14 0618  Weight: 175 lb 3.2 oz (79.47 kg) 187 lb 6.3 oz (85 kg)    PHYSICAL EXAM  General: Pleasant, NAD. Neuro: Alert and oriented X 3. Moves all extremities spontaneously. Psych: Normal affect. HEENT:  Normal  Neck: Supple without bruits or JVD. Lungs:  Resp regular and unlabored, CTA. Heart: Irregularly irregular no s3, s4,.  Grade 2/6 systolic ejection murmur at the base. Abdomen: Soft, non-tender, non-distended, BS + x 4.  Extremities: No clubbing, cyanosis or edema. DP/PT/Radials 2+ and equal bilaterally.  Accessory Clinical Findings  CBC  Recent Labs  02/08/14 1148 02/09/14 0308 02/10/14 0332  WBC 9.9 9.3 13.2*  NEUTROABS 7.1  --   --   HGB 13.5 13.7 12.4*  HCT 41.6 41.2 37.9*  MCV 97.9 100.0 99.5  PLT 215 219 209   Basic Metabolic Panel  Recent Labs  02/09/14 0308 02/10/14 0332  NA 143 137  K 3.5* 4.0  CL 100 98  CO2 30 27  GLUCOSE 99 121*  BUN 63* 63*  CREATININE 1.46* 1.61*  CALCIUM 9.9 9.5   Liver Function Tests  Recent Labs  02/09/14 0308  AST 14  ALT 13  ALKPHOS 62  BILITOT 0.8  PROT 7.0  ALBUMIN 2.9*   No results for input(s): LIPASE, AMYLASE in the last 72 hours. Cardiac Enzymes  Recent Labs  02/08/14 1745 02/08/14 2310 02/09/14 0308  TROPONINI <0.30 <0.30 <0.30   BNP Invalid input(s): POCBNP D-Dimer No results for input(s): DDIMER in the last 72 hours. Hemoglobin A1C No results for input(s): HGBA1C in the last 72 hours. Fasting Lipid Panel No results for input(s): CHOL, HDL, LDLCALC, TRIG, CHOLHDL, LDLDIRECT in the last 72 hours. Thyroid Function Tests No results for input(s): TSH, T4TOTAL, T3FREE, THYROIDAB in the last 72 hours.  Invalid input(s): FREET3  TELE atrial fibrillation with controlled ventricular response  ECG    Radiology/Studies  Dg Chest 2 View  02/09/2014   CLINICAL DATA:  Elevated B-type natriuretic peptide level. Initial encounter.   EXAM: CHEST  2 VIEW  COMPARISON:  Chest radiograph from 09/27/2012  FINDINGS: The lungs are well-aerated. Minimal left basilar atelectasis is noted. There is no evidence of pleural effusion or pneumothorax.  The heart is enlarged. No acute osseous abnormalities are seen. The patient is status post vertebroplasty at the lower thoracic spine.  IMPRESSION: Minimal left basilar atelectasis noted; lungs otherwise clear. Cardiomegaly.   Electronically Signed   By: Garald Balding M.D.   On: 02/09/2014 03:52   Ct Head Wo Contrast  02/08/2014   CLINICAL DATA:  Left-sided weakness. , fall. Slurred speech. Prior stroke.  EXAM: CT HEAD WITHOUT CONTRAST  TECHNIQUE: Contiguous axial images were obtained from the base of the skull through the vertex without intravenous contrast.  COMPARISON:  02/06/2013  FINDINGS: Old right cerebellar infarct, stable. Diffuse age related volume loss/atrophy. No acute intracranial abnormality. Specifically, no hemorrhage, hydrocephalus, mass lesion, acute infarction, or significant intracranial injury. No acute calvarial abnormality. Visualized paranasal sinuses and mastoids clear. Orbital soft tissues unremarkable.  IMPRESSION: No acute intracranial abnormality.   Electronically Signed   By: Rolm Baptise M.D.   On: 02/08/2014 14:28   Mr Brain Wo Contrast  02/09/2014   CLINICAL DATA:  Initial evaluation for left-sided weakness.  EXAM: MRI HEAD WITHOUT CONTRAST  TECHNIQUE: Multiplanar, multiecho pulse sequences of the brain and surrounding structures were obtained without intravenous contrast.  COMPARISON:  Prior CT from 02/08/2014.  FINDINGS: Diffuse prominence of the CSF containing spaces is compatible with age-related generalized cerebral atrophy. Mild patchy T2/FLAIR hyperintensity seen within the periventricular and deep white matter both cerebral hemispheres, nonspecific, but likely related to mild chronic small vessel ischemic changes.  Remote bilateral cerebellar infarcts present,  right larger than left. There are associated chronic blood products with the right cerebellar infarct. Additional remote infarcts seen within the high and posterior right frontal lobe. Moderate wallerian degeneration noted within the right middle cerebellar peduncle.  No abnormal foci of restricted diffusion to suggest acute intracranial infarct identified. Gray-white matter differentiation maintained. The right vertebral artery is likely occluded. Otherwise, normal intravascular flow voids present. No acute intracranial hemorrhage.  No mass lesion, midline shift, or mass effect. Ventricles within normal limits without evidence of hydrocephalus. No extra-axial fluid collection.  Craniocervical junction within normal limits. Incidental note made of a partially empty sella. No acute abnormality seen about the orbits.  Paranasal sinuses and mastoid air cells are clear.  IMPRESSION: 1. No acute intracranial infarct or other  process identified. 2. Remote bilateral cerebellar infarcts, right greater than left, with associated chronic blood products/hemorrhage in the inferior right cerebellar hemisphere. 3. Remote cortical infarct within the high posterior right frontal lobe. 4. Moderate wallerian degeneration within the right middle cerebellar peduncle. 5. Atrophy with very mild chronic small vessel ischemic changes.   Electronically Signed   By: Jeannine Boga M.D.   On: 02/09/2014 03:14   Mr Thoracic Spine Wo Contrast  02/09/2014   CLINICAL DATA:  80 year old male with back pain. Recent falls. Initial encounter. History of chronic thoracic compression fractures. History of right vertebral artery occlusion and cerebellar infarcts.  History of prostate cancer metastatic to bone.  History of melanoma.  EXAM: MRI THORACIC AND LUMBAR SPINE WITHOUT CONTRAST  TECHNIQUE: Multiplanar and multiecho pulse sequences of the thoracic and lumbar spine were obtained without intravenous contrast.  COMPARISON:  Cervical and  thoracic MRI 09/28/2012. CT Abdomen and Pelvis 04/27/2009.  FINDINGS: MR THORACIC SPINE FINDINGS  Limited sagittal imaging of the cervical spine is grossly stable. Chronic T6 and T11 compression fractures, the latter previously augmented. Stable thoracic vertebral height and alignment.  Resolved abnormal T1 hypo intensity in the T9 vertebral body since 2014. No marrow edema or evidence of acute osseous abnormality. No thoracic spine metastasis identified.  No thoracic spinal stenosis. Stable thoracic spinal cord with no abnormal cord signal.  Negative visualized posterior paraspinal soft tissues.  Stable visualized thoracic and upper abdominal viscera.  MR LUMBAR SPINE FINDINGS  Normal lumbar segmentation. Stable lumbar vertebral height and alignment since 2011. Chronic L2 superior endplate Schmorl node.  There is confluent abnormal marrow signal in the L4 vertebral body with early involvement of the anterior pedicle. The right vertebral body is affected encompassing an area of 34 x 23 by 30 mm (AP by transverse by CC). Associated T2 and FLAIR hyperintensity. No loss of L4 vertebral body height. No epidural or extraosseous extension.  No similar lesion identified elsewhere in the lumbar spine or sacrum, but there is 80 31 mm diameter similar bone lesion in the medial right iliac bone on series 16, image 33.  Visualized lower thoracic spinal cord is normal with conus medularis at L1. Normal non contrast cauda equina nerve roots. No lumbar spinal stenosis.  Chronic infrarenal abdominal aortic aneurysm now measures up to 47 mm maximal diameter (40 mm in 2011).  Chronic left renal atrophy. Other visualized abdominal viscera are stable. The bladder is distended, partially visualized.  Posterior paraspinal muscle atrophy.  IMPRESSION: MR THORACIC SPINE IMPRESSION  1. No acute findings in the thoracic spine. No thoracic spinal stenosis. 2. Regressed T9 bone metastasis since 2014.  MR LUMBAR SPINE IMPRESSION  1. Bone  metastases in the L4 vertebral body and medial right iliac bone measuring about 3 cm each. No pathologic fracture or extraosseous tumor identified. 2. Progressed chronic infrarenal abdominal aortic aneurysm since 2011, now 47 mm in diameter. Recommend followup by abdomen and pelvis CTA in 6 months, and vascular surgery referral/consultation if not already obtained. This recommendation follows ACR consensus guidelines: White Paper of the ACR Incidental Findings Committee II on Vascular Findings. J Am Coll Radiol 2013; 10:789-794. 3. No lumbar spinal stenosis.   Electronically Signed   By: Lars Pinks M.D.   On: 02/09/2014 11:53   Mr Lumbar Spine Wo Contrast  02/09/2014   CLINICAL DATA:  80 year old male with back pain. Recent falls. Initial encounter. History of chronic thoracic compression fractures. History of right vertebral artery occlusion and cerebellar infarcts.  History of prostate cancer metastatic to bone.  History of melanoma.  EXAM: MRI THORACIC AND LUMBAR SPINE WITHOUT CONTRAST  TECHNIQUE: Multiplanar and multiecho pulse sequences of the thoracic and lumbar spine were obtained without intravenous contrast.  COMPARISON:  Cervical and thoracic MRI 09/28/2012. CT Abdomen and Pelvis 04/27/2009.  FINDINGS: MR THORACIC SPINE FINDINGS  Limited sagittal imaging of the cervical spine is grossly stable. Chronic T6 and T11 compression fractures, the latter previously augmented. Stable thoracic vertebral height and alignment.  Resolved abnormal T1 hypo intensity in the T9 vertebral body since 2014. No marrow edema or evidence of acute osseous abnormality. No thoracic spine metastasis identified.  No thoracic spinal stenosis. Stable thoracic spinal cord with no abnormal cord signal.  Negative visualized posterior paraspinal soft tissues.  Stable visualized thoracic and upper abdominal viscera.  MR LUMBAR SPINE FINDINGS  Normal lumbar segmentation. Stable lumbar vertebral height and alignment since 2011. Chronic L2  superior endplate Schmorl node.  There is confluent abnormal marrow signal in the L4 vertebral body with early involvement of the anterior pedicle. The right vertebral body is affected encompassing an area of 34 x 23 by 30 mm (AP by transverse by CC). Associated T2 and FLAIR hyperintensity. No loss of L4 vertebral body height. No epidural or extraosseous extension.  No similar lesion identified elsewhere in the lumbar spine or sacrum, but there is 80 31 mm diameter similar bone lesion in the medial right iliac bone on series 16, image 33.  Visualized lower thoracic spinal cord is normal with conus medularis at L1. Normal non contrast cauda equina nerve roots. No lumbar spinal stenosis.  Chronic infrarenal abdominal aortic aneurysm now measures up to 47 mm maximal diameter (40 mm in 2011).  Chronic left renal atrophy. Other visualized abdominal viscera are stable. The bladder is distended, partially visualized.  Posterior paraspinal muscle atrophy.  IMPRESSION: MR THORACIC SPINE IMPRESSION  1. No acute findings in the thoracic spine. No thoracic spinal stenosis. 2. Regressed T9 bone metastasis since 2014.  MR LUMBAR SPINE IMPRESSION  1. Bone metastases in the L4 vertebral body and medial right iliac bone measuring about 3 cm each. No pathologic fracture or extraosseous tumor identified. 2. Progressed chronic infrarenal abdominal aortic aneurysm since 2011, now 47 mm in diameter. Recommend followup by abdomen and pelvis CTA in 6 months, and vascular surgery referral/consultation if not already obtained. This recommendation follows ACR consensus guidelines: White Paper of the ACR Incidental Findings Committee II on Vascular Findings. J Am Coll Radiol 2013; 10:789-794. 3. No lumbar spinal stenosis.   Electronically Signed   By: Lars Pinks M.D.   On: 02/09/2014 11:53   Ct Hip Left Wo Contrast  02/09/2014   CLINICAL DATA:  Acute onset of left-sided weakness and left hip pain. Four recent falls. Difficulty ambulating.  Initial encounter.  EXAM: CT OF THE LEFT HIP WITHOUT CONTRAST  TECHNIQUE: Multidetector CT imaging of the left hip was performed according to the standard protocol. Multiplanar CT image reconstructions were also generated.  COMPARISON:  CT of the abdomen and pelvis performed 04/27/2009  FINDINGS: There is no evidence of fracture or dislocation. Prominent bone islands are again noted at the left femoral head and neck. The left femoral head remains seated at the acetabulum. The left sacroiliac joint is unremarkable in appearance.  Mild focal bony expansion at the left inferior pubic ramus appears to reflect chronic sequelae of a remote lytic lesion at this location. Mild degenerative change is noted at the lower lumbar spine.  There is focal dilatation of the distal abdominal aorta to 3.4 cm in AP dimension and 4.7 cm in transverse dimension. Diffuse calcification is seen along the distal abdominal aorta and its branches. The prostate appears mildly enlarged. No additional soft tissue abnormalities are seen. Visualized small and large bowel loops are grossly unremarkable.  IMPRESSION: 1. No evidence of fracture or dislocation. 2. Mild focal bony expansion at the left inferior pubic ramus reflects chronic sequelae of a remote lytic lesion at this location; there has been interval peripheral ossification, without evidence of a recurrent mass. 3. Focal dilatation of the distal abdominal aorta to 3.4 cm in AP dimension and 4.7 cm in transverse dimension, increased in size from 2011. 4. Diffuse calcification along the distal abdominal aorta and its branches. 5. Mildly enlarged prostate.   Electronically Signed   By: Garald Balding M.D.   On: 02/09/2014 04:08    ASSESSMENT AND PLAN 1. Atrial fibrillation. Continue chronic warfarin. Rate control is adequate. 2. Hypertensive cardiovascular disease. Overall slight improvement on higher dose amlodipine 5 mg daily.  Would continue current dose 3. Old CVA. MRI of  brain shows no new lesions. 4. Prostate cancer 5. Diastolic heart failure. Appears euvolemic. 6. Hypokalemia. Repletion per primary service  7.  Abdominal aortic aneurysm 4.7 cm.  No intervention planned.  Plan: Physical therapy today.  Possibly home tomorrow with home physical therapy.   Signed, [name] MD

## 2014-02-10 NOTE — Progress Notes (Addendum)
Patient ID: Craig [name]  male  LPF:[id number]    DOB: [birth year]    DOA: 02/08/2014  PCP: [name], MD  Brief history of present illness  Patient is a 80 year old male, retired [employer], with hypertension, atrial fibrillation, prior history of stroke presented with left-sided weakness that started on the morning of admission. Per his wife, patient fell on December 8, hit his back and has hematoma in the right forearm. Last Saturday, 7 days ago he fell 3 times, hit his left knee. Subsequently he saw his orthopedic physician, x-ray of his left knee and hip were negative. Patient had steroid injection in the left knee. After the injection he was doing better and was able to use his walker. A day prior to admission patient was noticed to be sluggish and woke up with left-sided weakness difficulty with ambulation. Otherwise no acute slurred speech, no vision changes confusion or any focal neurological deficits. Patient was on Casodex and chemotherapy for prostrate cancer which was stopped last week due to concerns of causing weakness.  Assessment/Plan: Principal Problem:  Back, left hip/thigh pain Weakness with left sided weakness: The weakness appears to be more due to pain in the lower back radiating to the left hip and thigh - MRI of the brain was done which showed remote cerebellar infarct and cortical infarct in the right frontal lobe however no new acute infarct. - CT of the left hip was negative for any fracture or dislocation. Placed on Flexeril prn, scheduled Tylenol, Percocet as needed - Given his history of prostrate cancer, patient had bone scan done in 2014 which had shown increased uptake in T9.  MRI thoracic and lumbar spine showed bony metastases in the L4 vertebral body, medial right iliac bone measuring about 3 cm each, no pathological fracture or tumor identified.  - Patient otherwise is improving, much less pain today. D/w Dr Earlie Server rec'd radiation Onc consult.  I discussed in detail with Dr. Isidore Moos who rec'd IV dexamethasone, radiation oncology will see the patient tomorrow a.m. Started on decadron 4mg IVq6hrs. No neurological deficits, no cord compression on MRI. -Discussed in detail with the neurosurgery on call, Dr. Cyndy Freeze recommended to continue current management with back dexamethasone and radiation therapy, no role for neurosurgery at this time     Active Problems: Elevated BNP - Currently euvolemic, cardiology consulted, continue home dose of Lasix    Atrial fibrillation - Rate control, continue warfarin    Hypercholesterolemia - Continue statin    Prostate cancer - Currently chemotherapy on hold, defer to outpatient oncologist, Dr. Alen Blew  Mild acute on chronic renal insufficiency - Hold enablex, lasix, HCTZ  - Placed on tramadol for pain control  DVT Prophylaxis:   Code Status:  Family Communication: Discussed in detail with patient's wife at the bedside  Disposition:   Consultants:  Cardiology   Rad Onc  Procedures:  None   Antibiotics:  None     Subjective: Patient seen and examined, feels much better today, eating breakfast on his own, pain controlled, requesting for PT evaluation  Objective: Weight change: 5.53 kg (12 lb 3.1 oz)  Intake/Output Summary (Last 24 hours) at 02/10/14 1054 Last data filed at 02/10/14 1053  Gross per 24 hour  Intake   1062 ml  Output    650 ml  Net    412 ml   Blood pressure 174/61, pulse 65, temperature 97.5 F (36.4 C), temperature source Oral, resp. rate 18, height 6' (1.829 m), weight 85 kg (187 lb  6.3 oz), SpO2 99 %.  Physical Exam: General: Alert and awake, oriented x3, not in any acute distress. CVS: Irregularly regular, 2/6 systolic murmur Chest: clear to auscultation bilaterally, no wheezing, rales or rhonchi Abdomen: soft nontender, nondistended, normal bowel sounds  Extremities: no cyanosis, clubbing or edema noted bilaterally   Lab Results: Basic  Metabolic Panel:  Recent Labs Lab 02/09/14 0308 02/10/14 0332  NA 143 137  K 3.5* 4.0  CL 100 98  CO2 30 27  GLUCOSE 99 121*  BUN 63* 63*  CREATININE 1.46* 1.61*  CALCIUM 9.9 9.5   Liver Function Tests:  Recent Labs Lab 02/09/14 0308  AST 14  ALT 13  ALKPHOS 62  BILITOT 0.8  PROT 7.0  ALBUMIN 2.9*   No results for input(s): LIPASE, AMYLASE in the last 168 hours. No results for input(s): AMMONIA in the last 168 hours. CBC:  Recent Labs Lab 02/08/14 1148 02/09/14 0308 02/10/14 0332  WBC 9.9 9.3 13.2*  NEUTROABS 7.1  --   --   HGB 13.5 13.7 12.4*  HCT 41.6 41.2 37.9*  MCV 97.9 100.0 99.5  PLT 215 219 204   Cardiac Enzymes:  Recent Labs Lab 02/08/14 1745 02/08/14 2310 02/09/14 0308  TROPONINI <0.30 <0.30 <0.30   BNP: Invalid input(s): POCBNP CBG: No results for input(s): GLUCAP in the last 168 hours.   Micro Results: No results found for this or any previous visit (from the past 240 hour(s)).  Studies/Results: Dg Chest 2 View  02/09/2014   CLINICAL DATA:  Elevated B-type natriuretic peptide level. Initial encounter.  EXAM: CHEST  2 VIEW  COMPARISON:  Chest radiograph from 09/27/2012  FINDINGS: The lungs are well-aerated. Minimal left basilar atelectasis is noted. There is no evidence of pleural effusion or pneumothorax.  The heart is enlarged. No acute osseous abnormalities are seen. The patient is status post vertebroplasty at the lower thoracic spine.  IMPRESSION: Minimal left basilar atelectasis noted; lungs otherwise clear. Cardiomegaly.   Electronically Signed   By: Garald Balding M.D.   On: 02/09/2014 03:52   Ct Head Wo Contrast  02/08/2014   CLINICAL DATA:  Left-sided weakness. , fall. Slurred speech. Prior stroke.  EXAM: CT HEAD WITHOUT CONTRAST  TECHNIQUE: Contiguous axial images were obtained from the base of the skull through the vertex without intravenous contrast.  COMPARISON:  02/06/2013  FINDINGS: Old right cerebellar infarct, stable.  Diffuse age related volume loss/atrophy. No acute intracranial abnormality. Specifically, no hemorrhage, hydrocephalus, mass lesion, acute infarction, or significant intracranial injury. No acute calvarial abnormality. Visualized paranasal sinuses and mastoids clear. Orbital soft tissues unremarkable.  IMPRESSION: No acute intracranial abnormality.   Electronically Signed   By: Rolm Baptise M.D.   On: 02/08/2014 14:28   Mr Brain Wo Contrast  02/09/2014   CLINICAL DATA:  Initial evaluation for left-sided weakness.  EXAM: MRI HEAD WITHOUT CONTRAST  TECHNIQUE: Multiplanar, multiecho pulse sequences of the brain and surrounding structures were obtained without intravenous contrast.  COMPARISON:  Prior CT from 02/08/2014.  FINDINGS: Diffuse prominence of the CSF containing spaces is compatible with age-related generalized cerebral atrophy. Mild patchy T2/FLAIR hyperintensity seen within the periventricular and deep white matter both cerebral hemispheres, nonspecific, but likely related to mild chronic small vessel ischemic changes.  Remote bilateral cerebellar infarcts present, right larger than left. There are associated chronic blood products with the right cerebellar infarct. Additional remote infarcts seen within the high and posterior right frontal lobe. Moderate wallerian degeneration noted within the right middle cerebellar  peduncle.  No abnormal foci of restricted diffusion to suggest acute intracranial infarct identified. Gray-white matter differentiation maintained. The right vertebral artery is likely occluded. Otherwise, normal intravascular flow voids present. No acute intracranial hemorrhage.  No mass lesion, midline shift, or mass effect. Ventricles within normal limits without evidence of hydrocephalus. No extra-axial fluid collection.  Craniocervical junction within normal limits. Incidental note made of a partially empty sella. No acute abnormality seen about the orbits.  Paranasal sinuses and  mastoid air cells are clear.  IMPRESSION: 1. No acute intracranial infarct or other process identified. 2. Remote bilateral cerebellar infarcts, right greater than left, with associated chronic blood products/hemorrhage in the inferior right cerebellar hemisphere. 3. Remote cortical infarct within the high posterior right frontal lobe. 4. Moderate wallerian degeneration within the right middle cerebellar peduncle. 5. Atrophy with very mild chronic small vessel ischemic changes.   Electronically Signed   By: Jeannine Boga M.D.   On: 02/09/2014 03:14   Mr Thoracic Spine Wo Contrast  02/09/2014   CLINICAL DATA:  80 year old male with back pain. Recent falls. Initial encounter. History of chronic thoracic compression fractures. History of right vertebral artery occlusion and cerebellar infarcts.  History of prostate cancer metastatic to bone.  History of melanoma.  EXAM: MRI THORACIC AND LUMBAR SPINE WITHOUT CONTRAST  TECHNIQUE: Multiplanar and multiecho pulse sequences of the thoracic and lumbar spine were obtained without intravenous contrast.  COMPARISON:  Cervical and thoracic MRI 09/28/2012. CT Abdomen and Pelvis 04/27/2009.  FINDINGS: MR THORACIC SPINE FINDINGS  Limited sagittal imaging of the cervical spine is grossly stable. Chronic T6 and T11 compression fractures, the latter previously augmented. Stable thoracic vertebral height and alignment.  Resolved abnormal T1 hypo intensity in the T9 vertebral body since 2014. No marrow edema or evidence of acute osseous abnormality. No thoracic spine metastasis identified.  No thoracic spinal stenosis. Stable thoracic spinal cord with no abnormal cord signal.  Negative visualized posterior paraspinal soft tissues.  Stable visualized thoracic and upper abdominal viscera.  MR LUMBAR SPINE FINDINGS  Normal lumbar segmentation. Stable lumbar vertebral height and alignment since 2011. Chronic L2 superior endplate Schmorl node.  There is confluent abnormal marrow  signal in the L4 vertebral body with early involvement of the anterior pedicle. The right vertebral body is affected encompassing an area of 34 x 23 by 30 mm (AP by transverse by CC). Associated T2 and FLAIR hyperintensity. No loss of L4 vertebral body height. No epidural or extraosseous extension.  No similar lesion identified elsewhere in the lumbar spine or sacrum, but there is 80 31 mm diameter similar bone lesion in the medial right iliac bone on series 16, image 33.  Visualized lower thoracic spinal cord is normal with conus medularis at L1. Normal non contrast cauda equina nerve roots. No lumbar spinal stenosis.  Chronic infrarenal abdominal aortic aneurysm now measures up to 47 mm maximal diameter (40 mm in 2011).  Chronic left renal atrophy. Other visualized abdominal viscera are stable. The bladder is distended, partially visualized.  Posterior paraspinal muscle atrophy.  IMPRESSION: MR THORACIC SPINE IMPRESSION  1. No acute findings in the thoracic spine. No thoracic spinal stenosis. 2. Regressed T9 bone metastasis since 2014.  MR LUMBAR SPINE IMPRESSION  1. Bone metastases in the L4 vertebral body and medial right iliac bone measuring about 3 cm each. No pathologic fracture or extraosseous tumor identified. 2. Progressed chronic infrarenal abdominal aortic aneurysm since 2011, now 47 mm in diameter. Recommend followup by abdomen and pelvis CTA  in 6 months, and vascular surgery referral/consultation if not already obtained. This recommendation follows ACR consensus guidelines: White Paper of the ACR Incidental Findings Committee II on Vascular Findings. J Am Coll Radiol 2013; 10:789-794. 3. No lumbar spinal stenosis.   Electronically Signed   By: Lars Pinks M.D.   On: 02/09/2014 11:53   Mr Lumbar Spine Wo Contrast  02/09/2014   CLINICAL DATA:  80 year old male with back pain. Recent falls. Initial encounter. History of chronic thoracic compression fractures. History of right vertebral artery occlusion  and cerebellar infarcts.  History of prostate cancer metastatic to bone.  History of melanoma.  EXAM: MRI THORACIC AND LUMBAR SPINE WITHOUT CONTRAST  TECHNIQUE: Multiplanar and multiecho pulse sequences of the thoracic and lumbar spine were obtained without intravenous contrast.  COMPARISON:  Cervical and thoracic MRI 09/28/2012. CT Abdomen and Pelvis 04/27/2009.  FINDINGS: MR THORACIC SPINE FINDINGS  Limited sagittal imaging of the cervical spine is grossly stable. Chronic T6 and T11 compression fractures, the latter previously augmented. Stable thoracic vertebral height and alignment.  Resolved abnormal T1 hypo intensity in the T9 vertebral body since 2014. No marrow edema or evidence of acute osseous abnormality. No thoracic spine metastasis identified.  No thoracic spinal stenosis. Stable thoracic spinal cord with no abnormal cord signal.  Negative visualized posterior paraspinal soft tissues.  Stable visualized thoracic and upper abdominal viscera.  MR LUMBAR SPINE FINDINGS  Normal lumbar segmentation. Stable lumbar vertebral height and alignment since 2011. Chronic L2 superior endplate Schmorl node.  There is confluent abnormal marrow signal in the L4 vertebral body with early involvement of the anterior pedicle. The right vertebral body is affected encompassing an area of 34 x 23 by 30 mm (AP by transverse by CC). Associated T2 and FLAIR hyperintensity. No loss of L4 vertebral body height. No epidural or extraosseous extension.  No similar lesion identified elsewhere in the lumbar spine or sacrum, but there is 80 31 mm diameter similar bone lesion in the medial right iliac bone on series 16, image 33.  Visualized lower thoracic spinal cord is normal with conus medularis at L1. Normal non contrast cauda equina nerve roots. No lumbar spinal stenosis.  Chronic infrarenal abdominal aortic aneurysm now measures up to 47 mm maximal diameter (40 mm in 2011).  Chronic left renal atrophy. Other visualized abdominal  viscera are stable. The bladder is distended, partially visualized.  Posterior paraspinal muscle atrophy.  IMPRESSION: MR THORACIC SPINE IMPRESSION  1. No acute findings in the thoracic spine. No thoracic spinal stenosis. 2. Regressed T9 bone metastasis since 2014.  MR LUMBAR SPINE IMPRESSION  1. Bone metastases in the L4 vertebral body and medial right iliac bone measuring about 3 cm each. No pathologic fracture or extraosseous tumor identified. 2. Progressed chronic infrarenal abdominal aortic aneurysm since 2011, now 47 mm in diameter. Recommend followup by abdomen and pelvis CTA in 6 months, and vascular surgery referral/consultation if not already obtained. This recommendation follows ACR consensus guidelines: White Paper of the ACR Incidental Findings Committee II on Vascular Findings. J Am Coll Radiol 2013; 10:789-794. 3. No lumbar spinal stenosis.   Electronically Signed   By: Lars Pinks M.D.   On: 02/09/2014 11:53   Ct Hip Left Wo Contrast  02/09/2014   CLINICAL DATA:  Acute onset of left-sided weakness and left hip pain. Four recent falls. Difficulty ambulating. Initial encounter.  EXAM: CT OF THE LEFT HIP WITHOUT CONTRAST  TECHNIQUE: Multidetector CT imaging of the left hip was performed according to  the standard protocol. Multiplanar CT image reconstructions were also generated.  COMPARISON:  CT of the abdomen and pelvis performed 04/27/2009  FINDINGS: There is no evidence of fracture or dislocation. Prominent bone islands are again noted at the left femoral head and neck. The left femoral head remains seated at the acetabulum. The left sacroiliac joint is unremarkable in appearance.  Mild focal bony expansion at the left inferior pubic ramus appears to reflect chronic sequelae of a remote lytic lesion at this location. Mild degenerative change is noted at the lower lumbar spine.  There is focal dilatation of the distal abdominal aorta to 3.4 cm in AP dimension and 4.7 cm in transverse dimension.  Diffuse calcification is seen along the distal abdominal aorta and its branches. The prostate appears mildly enlarged. No additional soft tissue abnormalities are seen. Visualized small and large bowel loops are grossly unremarkable.  IMPRESSION: 1. No evidence of fracture or dislocation. 2. Mild focal bony expansion at the left inferior pubic ramus reflects chronic sequelae of a remote lytic lesion at this location; there has been interval peripheral ossification, without evidence of a recurrent mass. 3. Focal dilatation of the distal abdominal aorta to 3.4 cm in AP dimension and 4.7 cm in transverse dimension, increased in size from 2011. 4. Diffuse calcification along the distal abdominal aorta and its branches. 5. Mildly enlarged prostate.   Electronically Signed   By: Garald Balding M.D.   On: 02/09/2014 04:08    Medications: Scheduled Meds: . acetaminophen  500 mg Oral TID  . amLODipine  5 mg Oral Daily  . aspirin  81 mg Oral Daily  . atorvastatin  10 mg Oral Daily  . B-complex with vitamin C  1 tablet Oral Daily  . docusate sodium  100 mg Oral BID  . ezetimibe  10 mg Oral Daily  . [START ON 02/11/2014] lactose free nutrition  237 mL Oral BH-q7a  . multivitamin with minerals  1 tablet Oral Daily  . omega-3 acid ethyl esters  1 g Oral Daily  . potassium chloride SA  10 mEq Oral Daily  . sodium chloride  3 mL Intravenous Q12H  . sodium chloride  3 mL Intravenous Q12H  . traMADol  50 mg Oral Q12H  . warfarin  2.5 mg Oral Once per day on Sun Tue Thu Sat  . warfarin  5 mg Oral Once per day on Mon Wed Fri  . Warfarin - Pharmacist Dosing Inpatient   Does not apply q1800      LOS: 2 days   Delany Steury M.D. [facility] 02/10/2014, 10:54 AM Pager: [phone]  If 7PM-7AM, please contact night-coverage [email or web address] Password [email or web address]

## 2014-02-10 NOTE — Progress Notes (Signed)
ANTICOAGULATION CONSULT NOTE - Follow Up Consult  Pharmacy Consult for Coumadin Indication: AFib, hx CVA  Allergies  Allergen Reactions  . Pravachol     Muscle weakness  . Zocor [Simvastatin - High Dose]     Muscle weakness    Patient Measurements: Height: 6' (182.9 cm) Weight: 187 lb 6.3 oz (85 kg) IBW/kg (Calculated) : 77.6 Heparin Dosing Weight:   Vital Signs: Temp: 97.5 F (36.4 C) (12/20 0618) Temp Source: Oral (12/20 0618) BP: 174/61 mmHg (12/20 1036) Pulse Rate: 65 (12/20 1036)  Labs:  Recent Labs  02/08/14 1148 02/08/14 1745 02/08/14 2310 02/09/14 0308 02/10/14 0332  HGB 13.5  --   --  13.7 12.4*  HCT 41.6  --   --  41.2 37.9*  PLT 215  --   --  219 204  LABPROT 22.2*  --   --  22.2* 24.0*  INR 1.93*  --   --  1.92* 2.13*  CREATININE 1.33  --   --  1.46* 1.61*  TROPONINI  --  <0.30 <0.30 <0.30  --     Estimated Creatinine Clearance: 35.5 mL/min (by C-G formula based on Cr of 1.61).  Assessment: 89yom continues on chronic Coumadin for AFib. INR (2.13) remains therapeutic with reduced INR goal (per [facility] notes, INR goal 1.8-2.2).  - H/H and Plts stable - No significant bleeding reported - PTA regimen: 2.5mg  daily except 5mg  MWF  Goal of Therapy:  INR 1.8-2.2  Plan:  1. Continue Coumadin 2.5mg  qday except 5mg  MWF 2. Daily INR  Earleen Newport  [phone] 02/10/2014,12:07 PM

## 2014-02-11 ENCOUNTER — Ambulatory Visit
Admit: 2014-02-11 | Discharge: 2014-02-11 | Disposition: A | Discharge status: Home / Self Care | Payer: Medicare Other | Attending: Radiation Oncology | Admitting: Radiation Oncology

## 2014-02-11 DIAGNOSIS — I119 Hypertensive heart disease without heart failure: Secondary | ICD-10-CM

## 2014-02-11 DIAGNOSIS — I714 Abdominal aortic aneurysm, without rupture, unspecified: Secondary | ICD-10-CM

## 2014-02-11 DIAGNOSIS — I10 Essential (primary) hypertension: Secondary | ICD-10-CM

## 2014-02-11 DIAGNOSIS — Z8673 Personal history of transient ischemic attack (TIA), and cerebral infarction without residual deficits: Secondary | ICD-10-CM

## 2014-02-11 DIAGNOSIS — I5032 Chronic diastolic (congestive) heart failure: Secondary | ICD-10-CM

## 2014-02-11 DIAGNOSIS — E876 Hypokalemia: Secondary | ICD-10-CM

## 2014-02-11 DIAGNOSIS — I482 Chronic atrial fibrillation: Secondary | ICD-10-CM

## 2014-02-11 LAB — CBC
HCT: 40 % (ref 39.0–52.0)
Hemoglobin: 13.3 g/dL (ref 13.0–17.0)
MCH: 32.2 pg (ref 26.0–34.0)
MCHC: 33.3 g/dL (ref 30.0–36.0)
MCV: 96.9 fL (ref 78.0–100.0)
PLATELETS: 219 10*3/uL (ref 150–400)
RBC: 4.13 MIL/uL — ABNORMAL LOW (ref 4.22–5.81)
RDW: 13.2 % (ref 11.5–15.5)
WBC: 11.3 10*3/uL — ABNORMAL HIGH (ref 4.0–10.5)

## 2014-02-11 LAB — BASIC METABOLIC PANEL
Anion gap: 14 (ref 5–15)
BUN: 70 mg/dL — ABNORMAL HIGH (ref 6–23)
CO2: 28 mEq/L (ref 19–32)
CREATININE: 1.43 mg/dL — AB (ref 0.50–1.35)
Calcium: 9.7 mg/dL (ref 8.4–10.5)
Chloride: 98 mEq/L (ref 96–112)
GFR, EST AFRICAN AMERICAN: 49 mL/min — AB (ref >90)
GFR, EST NON AFRICAN AMERICAN: 42 mL/min — AB (ref >90)
Glucose, Bld: 152 mg/dL — ABNORMAL HIGH (ref 70–99)
Potassium: 4.5 mEq/L (ref 3.7–5.3)
Sodium: 140 mEq/L (ref 137–147)

## 2014-02-11 LAB — PROTIME-INR
INR: 2.02 — AB (ref 0.00–1.49)
PROTHROMBIN TIME: 23 s — AB (ref 11.6–15.2)

## 2014-02-11 MED ORDER — PERFLUTREN LIPID MICROSPHERE
2.0000 mL | INTRAVENOUS | Status: AC | PRN
  Administered 2014-02-11: 2 mL via INTRAVENOUS
  Filled 2014-02-11: qty 10

## 2014-02-11 NOTE — Progress Notes (Signed)
Patient: Craig [name]. 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 MRI here showed bony mets. Denies CP, SOB, dizziness, syncope.   Objective: Telemetry: atrial fibrillation CVR, HR dipping into upper 30's yesterday transiently but now upper 40s-50s Physical Exam: Blood pressure 159/65, pulse 54, temperature 97.6 F (36.4 C), temperature source Oral, resp. rate 17, height 6' (1.829 m), weight 183 lb 3.2 oz (83.1 kg), SpO2 97 %. General: Frail elderly WM in no acute distress. Head: Normocephalic, atraumatic, sclera non-icteric, no xanthomas, nares are without discharge. Neck: JVP not elevated. Lungs: Clear bilaterally to auscultation without wheezes, rales, or rhonchi. Breathing is unlabored. Heart: Irregularly irregular S1 S2, soft 2/6 SEM RUSB.  Abdomen: Soft, non-tender, non-distended with normoactive bowel sounds. No rebound/guarding. Extremities: No clubbing or cyanosis. No edema.  Neuro: Alert and oriented X 3. Moves all extremities spontaneously. Slightly hard of hearing. Psych:  Responds to questions appropriately with a normal affect.   Intake/Output Summary (Last 24 hours) at 02/11/14 0841 Last data filed at 02/11/14 0600  Gross per 24 hour  Intake    783 ml  Output    550 ml  Net    233 ml    Inpatient Medications:  . acetaminophen  500 mg Oral TID  . amLODipine  5 mg Oral Daily  . aspirin  81 mg Oral Daily  . atorvastatin  10 mg Oral Daily  . B-complex with vitamin C  1 tablet Oral Daily  . dexamethasone  4 mg Intravenous 4 times per day  . docusate sodium  100 mg Oral BID  . ezetimibe  10 mg Oral Daily  . lactose free nutrition  237 mL Oral BH-q7a  . multivitamin with minerals  1 tablet Oral Daily  . omega-3 acid ethyl esters  1 g Oral Daily  . potassium chloride SA  10 mEq Oral Daily  .  sodium chloride  3 mL Intravenous Q12H  . sodium chloride  3 mL Intravenous Q12H  . warfarin  2.5 mg Oral Once per day on Sun Tue Thu Sat  . warfarin  5 mg Oral Once per day on Mon Wed Fri  . Warfarin - Pharmacist Dosing Inpatient   Does not apply q1800   Infusions:    Labs:  Recent Labs  02/09/14 0308 02/10/14 0332  NA 143 137  K 3.5* 4.0  CL 100 98  CO2 30 27  GLUCOSE 99 121*  BUN 63* 63*  CREATININE 1.46* 1.61*  CALCIUM 9.9 9.5    Recent Labs  02/09/14 0308  AST 14  ALT 13  ALKPHOS 62  BILITOT 0.8  PROT 7.0  ALBUMIN 2.9*    Recent Labs  02/08/14 1148 02/09/14 0308 02/10/14 0332  WBC 9.9 9.3 13.2*  NEUTROABS 7.1  --   --   HGB 13.5 13.7 12.4*  HCT 41.6 41.2 37.9*  MCV 97.9 100.0 99.5  PLT 215 219 204    Recent Labs  02/08/14 1745 02/08/14 2310 02/09/14 0308  TROPONINI <0.30 <0.30 <0.30   Invalid input(s): POCBNP No results for input(s): HGBA1C in the last 72 hours.   Radiology/Studies:  Dg Chest 2 View  02/09/2014   CLINICAL DATA:  Elevated B-type natriuretic peptide level. Initial encounter.  EXAM: CHEST  2 VIEW  COMPARISON:  Chest radiograph from 09/27/2012  FINDINGS: The lungs are well-aerated. Minimal  left basilar atelectasis is noted. There is no evidence of pleural effusion or pneumothorax.  The heart is enlarged. No acute osseous abnormalities are seen. The patient is status post vertebroplasty at the lower thoracic spine.  IMPRESSION: Minimal left basilar atelectasis noted; lungs otherwise clear. Cardiomegaly.   Electronically Signed   By: Garald Balding M.D.   On: 02/09/2014 03:52   Ct Head Wo Contrast  02/08/2014   CLINICAL DATA:  Left-sided weakness. , fall. Slurred speech. Prior stroke.  EXAM: CT HEAD WITHOUT CONTRAST  TECHNIQUE: Contiguous axial images were obtained from the base of the skull through the vertex without intravenous contrast.  COMPARISON:  02/06/2013  FINDINGS: Old right cerebellar infarct, stable. Diffuse age related  volume loss/atrophy. No acute intracranial abnormality. Specifically, no hemorrhage, hydrocephalus, mass lesion, acute infarction, or significant intracranial injury. No acute calvarial abnormality. Visualized paranasal sinuses and mastoids clear. Orbital soft tissues unremarkable.  IMPRESSION: No acute intracranial abnormality.   Electronically Signed   By: Rolm Baptise M.D.   On: 02/08/2014 14:28   Mr Brain Wo Contrast  02/09/2014   CLINICAL DATA:  Initial evaluation for left-sided weakness.  EXAM: MRI HEAD WITHOUT CONTRAST  TECHNIQUE: Multiplanar, multiecho pulse sequences of the brain and surrounding structures were obtained without intravenous contrast.  COMPARISON:  Prior CT from 02/08/2014.  FINDINGS: Diffuse prominence of the CSF containing spaces is compatible with age-related generalized cerebral atrophy. Mild patchy T2/FLAIR hyperintensity seen within the periventricular and deep white matter both cerebral hemispheres, nonspecific, but likely related to mild chronic small vessel ischemic changes.  Remote bilateral cerebellar infarcts present, right larger than left. There are associated chronic blood products with the right cerebellar infarct. Additional remote infarcts seen within the high and posterior right frontal lobe. Moderate wallerian degeneration noted within the right middle cerebellar peduncle.  No abnormal foci of restricted diffusion to suggest acute intracranial infarct identified. Gray-white matter differentiation maintained. The right vertebral artery is likely occluded. Otherwise, normal intravascular flow voids present. No acute intracranial hemorrhage.  No mass lesion, midline shift, or mass effect. Ventricles within normal limits without evidence of hydrocephalus. No extra-axial fluid collection.  Craniocervical junction within normal limits. Incidental note made of a partially empty sella. No acute abnormality seen about the orbits.  Paranasal sinuses and mastoid air cells are  clear.  IMPRESSION: 1. No acute intracranial infarct or other process identified. 2. Remote bilateral cerebellar infarcts, right greater than left, with associated chronic blood products/hemorrhage in the inferior right cerebellar hemisphere. 3. Remote cortical infarct within the high posterior right frontal lobe. 4. Moderate wallerian degeneration within the right middle cerebellar peduncle. 5. Atrophy with very mild chronic small vessel ischemic changes.   Electronically Signed   By: Jeannine Boga M.D.   On: 02/09/2014 03:14   Mr Thoracic Spine Wo Contrast  02/09/2014   CLINICAL DATA:  80 year old male with back pain. Recent falls. Initial encounter. History of chronic thoracic compression fractures. History of right vertebral artery occlusion and cerebellar infarcts.  History of prostate cancer metastatic to bone.  History of melanoma.  EXAM: MRI THORACIC AND LUMBAR SPINE WITHOUT CONTRAST  TECHNIQUE: Multiplanar and multiecho pulse sequences of the thoracic and lumbar spine were obtained without intravenous contrast.  COMPARISON:  Cervical and thoracic MRI 09/28/2012. CT Abdomen and Pelvis 04/27/2009.  FINDINGS: MR THORACIC SPINE FINDINGS  Limited sagittal imaging of the cervical spine is grossly stable. Chronic T6 and T11 compression fractures, the latter previously augmented. Stable thoracic vertebral height and alignment.  Resolved  abnormal T1 hypo intensity in the T9 vertebral body since 2014. No marrow edema or evidence of acute osseous abnormality. No thoracic spine metastasis identified.  No thoracic spinal stenosis. Stable thoracic spinal cord with no abnormal cord signal.  Negative visualized posterior paraspinal soft tissues.  Stable visualized thoracic and upper abdominal viscera.  MR LUMBAR SPINE FINDINGS  Normal lumbar segmentation. Stable lumbar vertebral height and alignment since 2011. Chronic L2 superior endplate Schmorl node.  There is confluent abnormal marrow signal in the L4  vertebral body with early involvement of the anterior pedicle. The right vertebral body is affected encompassing an area of 34 x 23 by 30 mm (AP by transverse by CC). Associated T2 and FLAIR hyperintensity. No loss of L4 vertebral body height. No epidural or extraosseous extension.  No similar lesion identified elsewhere in the lumbar spine or sacrum, but there is 80 31 mm diameter similar bone lesion in the medial right iliac bone on series 16, image 33.  Visualized lower thoracic spinal cord is normal with conus medularis at L1. Normal non contrast cauda equina nerve roots. No lumbar spinal stenosis.  Chronic infrarenal abdominal aortic aneurysm now measures up to 47 mm maximal diameter (40 mm in 2011).  Chronic left renal atrophy. Other visualized abdominal viscera are stable. The bladder is distended, partially visualized.  Posterior paraspinal muscle atrophy.  IMPRESSION: MR THORACIC SPINE IMPRESSION  1. No acute findings in the thoracic spine. No thoracic spinal stenosis. 2. Regressed T9 bone metastasis since 2014.  MR LUMBAR SPINE IMPRESSION  1. Bone metastases in the L4 vertebral body and medial right iliac bone measuring about 3 cm each. No pathologic fracture or extraosseous tumor identified. 2. Progressed chronic infrarenal abdominal aortic aneurysm since 2011, now 47 mm in diameter. Recommend followup by abdomen and pelvis CTA in 6 months, and vascular surgery referral/consultation if not already obtained. This recommendation follows ACR consensus guidelines: White Paper of the ACR Incidental Findings Committee II on Vascular Findings. J Am Coll Radiol 2013; 10:789-794. 3. No lumbar spinal stenosis.   Electronically Signed   By: Lars Pinks M.D.   On: 02/09/2014 11:53   Mr Lumbar Spine Wo Contrast  02/09/2014   CLINICAL DATA:  80 year old male with back pain. Recent falls. Initial encounter. History of chronic thoracic compression fractures. History of right vertebral artery occlusion and cerebellar  infarcts.  History of prostate cancer metastatic to bone.  History of melanoma.  EXAM: MRI THORACIC AND LUMBAR SPINE WITHOUT CONTRAST  TECHNIQUE: Multiplanar and multiecho pulse sequences of the thoracic and lumbar spine were obtained without intravenous contrast.  COMPARISON:  Cervical and thoracic MRI 09/28/2012. CT Abdomen and Pelvis 04/27/2009.  FINDINGS: MR THORACIC SPINE FINDINGS  Limited sagittal imaging of the cervical spine is grossly stable. Chronic T6 and T11 compression fractures, the latter previously augmented. Stable thoracic vertebral height and alignment.  Resolved abnormal T1 hypo intensity in the T9 vertebral body since 2014. No marrow edema or evidence of acute osseous abnormality. No thoracic spine metastasis identified.  No thoracic spinal stenosis. Stable thoracic spinal cord with no abnormal cord signal.  Negative visualized posterior paraspinal soft tissues.  Stable visualized thoracic and upper abdominal viscera.  MR LUMBAR SPINE FINDINGS  Normal lumbar segmentation. Stable lumbar vertebral height and alignment since 2011. Chronic L2 superior endplate Schmorl node.  There is confluent abnormal marrow signal in the L4 vertebral body with early involvement of the anterior pedicle. The right vertebral body is affected encompassing an area of 34 x  23 by 30 mm (AP by transverse by CC). Associated T2 and FLAIR hyperintensity. No loss of L4 vertebral body height. No epidural or extraosseous extension.  No similar lesion identified elsewhere in the lumbar spine or sacrum, but there is 80 31 mm diameter similar bone lesion in the medial right iliac bone on series 16, image 33.  Visualized lower thoracic spinal cord is normal with conus medularis at L1. Normal non contrast cauda equina nerve roots. No lumbar spinal stenosis.  Chronic infrarenal abdominal aortic aneurysm now measures up to 47 mm maximal diameter (40 mm in 2011).  Chronic left renal atrophy. Other visualized abdominal viscera are  stable. The bladder is distended, partially visualized.  Posterior paraspinal muscle atrophy.  IMPRESSION: MR THORACIC SPINE IMPRESSION  1. No acute findings in the thoracic spine. No thoracic spinal stenosis. 2. Regressed T9 bone metastasis since 2014.  MR LUMBAR SPINE IMPRESSION  1. Bone metastases in the L4 vertebral body and medial right iliac bone measuring about 3 cm each. No pathologic fracture or extraosseous tumor identified. 2. Progressed chronic infrarenal abdominal aortic aneurysm since 2011, now 47 mm in diameter. Recommend followup by abdomen and pelvis CTA in 6 months, and vascular surgery referral/consultation if not already obtained. This recommendation follows ACR consensus guidelines: White Paper of the ACR Incidental Findings Committee II on Vascular Findings. J Am Coll Radiol 2013; 10:789-794. 3. No lumbar spinal stenosis.   Electronically Signed   By: Lars Pinks M.D.   On: 02/09/2014 11:53   Ct Hip Left Wo Contrast  02/09/2014   CLINICAL DATA:  Acute onset of left-sided weakness and left hip pain. Four recent falls. Difficulty ambulating. Initial encounter.  EXAM: CT OF THE LEFT HIP WITHOUT CONTRAST  TECHNIQUE: Multidetector CT imaging of the left hip was performed according to the standard protocol. Multiplanar CT image reconstructions were also generated.  COMPARISON:  CT of the abdomen and pelvis performed 04/27/2009  FINDINGS: There is no evidence of fracture or dislocation. Prominent bone islands are again noted at the left femoral head and neck. The left femoral head remains seated at the acetabulum. The left sacroiliac joint is unremarkable in appearance.  Mild focal bony expansion at the left inferior pubic ramus appears to reflect chronic sequelae of a remote lytic lesion at this location. Mild degenerative change is noted at the lower lumbar spine.  There is focal dilatation of the distal abdominal aorta to 3.4 cm in AP dimension and 4.7 cm in transverse dimension. Diffuse  calcification is seen along the distal abdominal aorta and its branches. The prostate appears mildly enlarged. No additional soft tissue abnormalities are seen. Visualized small and large bowel loops are grossly unremarkable.  IMPRESSION: 1. No evidence of fracture or dislocation. 2. Mild focal bony expansion at the left inferior pubic ramus reflects chronic sequelae of a remote lytic lesion at this location; there has been interval peripheral ossification, without evidence of a recurrent mass. 3. Focal dilatation of the distal abdominal aorta to 3.4 cm in AP dimension and 4.7 cm in transverse dimension, increased in size from 2011. 4. Diffuse calcification along the distal abdominal aorta and its branches. 5. Mildly enlarged prostate.   Electronically Signed   By: Garald Balding M.D.   On: 02/09/2014 04:08     Assessment and Plan  1. Weakness - felt due to pain in lower back radiating to hip and thigh. IM involving radiation oncology per notes. 2. Metastatic prostate CA to bones - per IM/onc. 3. Chronic  atrial fibrillation with CVR, intermittent bradycardia - continue Coumadin as tolerated. Would consider discontinuation of aspirin given his recent falls. He says he was on this plus Coumadin for his AF but I don't think he needs both. BB has been held. Suspect lower rates yesterday occurred in the setting of somnolence from Tramadol which was d/c'd. Wife reports baseline HR 50's. 4. Chronic diastolic CHF with hypertensive heart disease - euvolemic.  5. HTN - Consider re-addition of maintenance Lasix which may also help with BP. 6. CKD III - stable. 7. H/o CVA - MRI brain without new lesions. 8. AAA 4.7cm - no intervention planned per Dr. Mare Ferrari. 9. Hypokalemia - being managed by primary sevice.  Signed, Melina Copa PA-C Patient seen and examined. I agree with the assessment and plan as detailed above. See also my additional thoughts below.   I agree with the assessment and plan as outlined  above. I would discontinue aspirin as long as he is on Coumadin. I have not written this order. Need to be sure that the other physicians caring for the patient do not know of another reason why he should be kept on aspirin with his Coumadin.  Dola Argyle, MD, [facility] 02/11/2014 10:06 AM

## 2014-02-11 NOTE — Care Management Note (Unsigned)
    Page 1 of 2   02/12/2014     2:11:24 PM CARE MANAGEMENT NOTE 02/12/2014  Patient:  Craig [name], Craig [name]   Account Number:  [id number]  Date Initiated:  02/11/2014  Documentation initiated by:  Craig Wisnewski  Subjective/Objective Assessment:   Pt adm on 02/08/14 with extremity weakness, back pain.  MRI showed bony mets to L4 vertebral body and medial rt iliac bone.  PTA, pt fairly independent and lives with spouse, though weaker lately.     Action/Plan:   PT eval today; recommending SNF vs home with [facility] and wife. Pt/wife desire to be home for the holidays.  Left [place] list of [place] agencies with pt/wife for review.  Pt will likely need lightweight WC for home.   Anticipated DC Date:  02/12/2014   Anticipated DC Plan:  [place]  CM consult      [facility] Choice  HOME HEALTH   Choice offered to / List presented to:  C-1 Patient        [place] arranged  [place].   Status of service:  Completed, signed off Medicare Important Message given?  YES (If response is "NO", the following Medicare IM given date fields will be blank) Date Medicare IM given:  02/11/2014 Medicare IM given by:  Shirlette Scarber Date Additional Medicare IM given:   Additional Medicare IM given by:    Discharge Disposition:  [place]  Per UR Regulation:  Reviewed for med. necessity/level of care/duration of stay  If discussed at [place] of Stay Meetings, dates discussed:    Comments:  02/12/14 Ellan Lambert, RN, BSN [phone] Pt transferred to [facility] today for radiation treatment.  [place] services ordered; Pt/wife prefer [facility] for [facility] services. Referral to [facility] for [facility] follow up.  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

## 2014-02-11 NOTE — Progress Notes (Addendum)
The patient's wife was updated via the telephone.

## 2014-02-11 NOTE — Evaluation (Signed)
Physical Therapy Evaluation Patient Details Name: Craig [name]. MRN: [id number] DOB: August 22, 1924 Today's Date: 02/11/2014   History of Present Illness  Patient is an 80 yo male who presents with left side weakness; unclear if related to pain, patient with frequent fall  Clinical Impression  Patient demonstrates deficits in functional mobility as indicated below. Will need continued skilled PT to address deficits and maximize function. Will see as indicated and progress as tolerated.  Spoke with patient and spouse at length regarding concerns for mobility as well as patient desires for post acute disposition. Ideally, family would like patient to be home for holiday, but if absolutely needed, may be agreeable to ST SNF ([facility] preferred). Spoke with LCSW regarding concerns for SNF placement if needed, will see patient in the am to see if spouse can safely assist.  May need to consider SNF, however, if patient not eligible for SNF, then patient may need HHPT and d/c home with wheel chair for mobility. Will await notes [id number] consult and follow up with patient and wife regarding best and safest possible disposition.     Follow Up Recommendations SNF vs Home health PT;Supervision/Assistance - 24 hour;Supervision for mobility/OOB    Equipment Recommendations  Wheelchair (measurements PT);Wheelchair cushion (measurements PT) (Only if unable to ambulate safely and d/c home)    Recommendations for Other Services       Precautions / Restrictions Precautions Precautions: Fall Restrictions Weight Bearing Restrictions: No      Mobility  Bed Mobility Overal bed mobility: Needs Assistance Bed Mobility: Supine to Sit;Sit to Supine     Supine to sit: Mod assist Sit to supine: Min assist   General bed mobility comments: Assist for sequencing and elevation to upright for coming out of bed, assist for repositioning upon return to bed  Transfers Overall transfer level: Needs  assistance Equipment used: Rolling walker (2 wheeled);2 person hand held assist Transfers: Sit to/from [facility] Sit to Stand: Mod assist Stand pivot transfers: Mod assist       General transfer comment: Assist to elevate to upright, assist for stability, VCs for hand placement and safety. Poor control of descent to chair. Cues to come to front edge of chair for standing  Ambulation/Gait Ambulation/Gait assistance: Min assist;Mod assist Ambulation Distance (Feet): 65 Feet Assistive device: Rolling walker (2 wheeled) Gait Pattern/deviations: Step-through pattern;Decreased stride length;Decreased step length - left;Decreased dorsiflexion - left;Decreased weight shift to left;Shuffle;Trunk flexed;Narrow base of support;Drifts right/left Gait velocity: decreased Gait velocity interpretation: Below normal speed for age/gender General Gait Details: patient with poor ability to clear LLE and weight shift for progression of ambulation. Cues for positioning and safety with use of RW. Assist for stability, increased assist during turns  Stairs            Wheelchair Mobility    Modified Rankin (Stroke Patients Only) Modified Rankin (Stroke Patients Only) Pre-Morbid Rankin Score: Moderate disability Modified Rankin: Moderately severe disability     Balance Overall balance assessment: Needs assistance Sitting-balance support: Feet supported Sitting balance-Leahy Scale: Fair     Standing balance support: During functional activity Standing balance-Leahy Scale: Poor Standing balance comment: increased assist required for stability despite use of RW, unable to stand without assist or device                             Pertinent Vitals/Pain Pain Assessment: No/denies pain    Home Living Family/patient expects to be discharged to::  Private residence Living Arrangements: Spouse/significant other Available Help at Discharge: Family Type of Home:  House Home Access: Level entry     [place]: [place]: [place] - built in;Walker - 2 wheels;Grab bars - tub/shower (chair lift)      Prior Function Level of Independence: Needs assistance   Gait / Transfers Assistance Needed: assist with standing, some assist for mobility, more recently required increased assist.      Comments: multiple falls at home recently, uses Rollator for ambulation     Hand Dominance   Dominant Hand: Right    Extremity/Trunk Assessment   Upper Extremity Assessment: Defer to OT evaluation           Lower Extremity Assessment: Generalized weakness;LLE deficits/detail   LLE Deficits / Details: decreased functional strength LLE, poor coordination  Cervical / Trunk Assessment:  (fwd head posture)  Communication   Communication: No difficulties  Cognition Arousal/Alertness: Awake/alert Behavior During Therapy: WFL for tasks assessed/performed Overall Cognitive Status: Impaired/Different from baseline Area of Impairment: Problem solving             Problem Solving: Slow processing;Difficulty sequencing;Requires verbal cues;Requires tactile cues General Comments: patient with difficulty sequencing how to get OOB despite cues, required physical cues and increased assist to perform    General Comments General comments (skin integrity, edema, etc.): VS stable during session.     Exercises        Assessment/Plan    PT Assessment Patient needs continued PT services  PT Diagnosis Difficulty walking;Generalized weakness   PT Problem List Decreased strength;Decreased activity tolerance;Decreased balance;Decreased mobility;Decreased coordination;Decreased knowledge of use of DME  PT Treatment Interventions DME instruction;Gait training;Functional mobility training;Therapeutic activities;Therapeutic exercise;Balance training;Patient/family education   PT Goals (Current goals can be found in the Care Plan section) Acute  Rehab PT Goals Patient Stated Goal: to be home for holiday PT Goal Formulation: With patient/family Time For Goal Achievement: 02/25/14 Potential to Achieve Goals: Good    Frequency Min 3X/week   Barriers to discharge        Co-evaluation               End of Session Equipment Utilized During Treatment: Gait belt Activity Tolerance: Patient tolerated treatment well Patient left: in bed;with call bell/phone within reach;with bed alarm set;with family/visitor present Nurse Communication: Mobility status         Time: 1011-1053 PT Time Calculation (min) (ACUTE ONLY): 42 min   Charges:   PT Evaluation $Initial PT Evaluation Tier I: 1 Procedure PT Treatments $Gait Training: 8-22 mins $Therapeutic Activity: 8-22 mins $Self Care/Home Management: 8-22   PT G CodesDuncan Dull 02/11/2014, 10:59 AM Alben Deeds, PT DPT  [phone]

## 2014-02-11 NOTE — Progress Notes (Signed)
Patient ID: Craig [name]  male  DUK:[id number]    DOB: 10-Mar-1924    DOA: 02/08/2014  PCP: [name], MD  Brief history of present illness  Patient is a 80 year old male, retired [employer] manager, with hypertension, atrial fibrillation, prior history of stroke presented with left-sided weakness that started on the morning of admission. Per his wife, patient fell on December 8, hit his back and has hematoma in the right forearm. Last Saturday, 7 days ago he fell 3 times, hit his left knee. Subsequently he saw his orthopedic physician, x-ray of his left knee and hip were negative. Patient had steroid injection in the left knee. After the injection he was doing better and was able to use his walker. A day prior to admission patient was noticed to be sluggish and woke up with left-sided weakness difficulty with ambulation. Otherwise no acute slurred speech, no vision changes confusion or any focal neurological deficits. Patient was on Casodex and chemotherapy for prostrate cancer which was stopped last week due to concerns of causing weakness.  Assessment/Plan: Principal Problem:  Back, left hip/thigh pain Weakness with left sided weakness: The weakness appears to be more due to pain in the lower back radiating to the left hip and thigh - MRI of the brain was done which showed remote cerebellar infarct and cortical infarct in the right frontal lobe however no new acute infarct. - CT of the left hip was negative for any fracture or dislocation. Placed on Flexeril prn, scheduled Tylenol, Percocet as needed - Given his history of prostrate cancer, MRI thoracic and lumbar spine done and showed bony metastases in the L4 vertebral body, medial right iliac bone measuring about 3 cm each, no pathological fracture or tumor identified.  - Patient otherwise is improving, no pain on lying in bed, awaiting physical therapy evaluation - radiation oncology to see the patient today for further management,  continue IV Decadron per Dr Pearlie Oyster recs    - Discontinue tramadol, patient was too sleepy and bradycardiac yesterday    Active Problems: Elevated BNP - Currently euvolemic, cardiology consulted, continue home dose of Lasix    Atrial fibrillation - Rate control, continue warfarin    Hypercholesterolemia - Continue statin    Prostate cancer - Currently chemotherapy on hold, defer to outpatient oncologist, Dr. Alen Blew  Mild acute on chronic renal insufficiency- improving - dc'ed enablex,  hold lasix, HCTZ   DVT Prophylaxis: On Coumadin  Code Status:Full code  Family Communication: Discussed in detail with patient's wife at the bedside  Disposition: Hopefully tomorrow  Consultants:  Cardiology   Rad Onc  Procedures:  None   Antibiotics:  None     Subjective: Patient seen and examined, feels better, no pain while in bed, awaiting PT evaluation  Objective: Weight change: -1.9 kg (-4 lb 3 oz)  Intake/Output Summary (Last 24 hours) at 02/11/14 1010 Last data filed at 02/11/14 0916  Gross per 24 hour  Intake    903 ml  Output    550 ml  Net    353 ml   Blood pressure 159/65, pulse 54, temperature 97.6 F (36.4 C), temperature source Oral, resp. rate 17, height 6' (1.829 m), weight 83.1 kg (183 lb 3.2 oz), SpO2 97 %.  Physical Exam: General: Alert and awake, oriented x3, NAD CVS: Irregularly regular, 2/6 systolic murmur Chest: CTAB, no wheezing, rales or rhonchi Abdomen: soft NT, ND, NBS  Extremities: no c/c/e bilaterally   Lab Results: Basic Metabolic Panel:  Recent Labs  Lab 02/10/14 0332 02/11/14 0840  NA 137 140  K 4.0 4.5  CL 98 98  CO2 27 28  GLUCOSE 121* 152*  BUN 63* 70*  CREATININE 1.61* 1.43*  CALCIUM 9.5 9.7   Liver Function Tests:  Recent Labs Lab 02/09/14 0308  AST 14  ALT 13  ALKPHOS 62  BILITOT 0.8  PROT 7.0  ALBUMIN 2.9*   No results for input(s): LIPASE, AMYLASE in the last 168 hours. No results for input(s):  AMMONIA in the last 168 hours. CBC:  Recent Labs Lab 02/08/14 1148  02/10/14 0332 02/11/14 0840  WBC 9.9  < > 13.2* 11.3*  NEUTROABS 7.1  --   --   --   HGB 13.5  < > 12.4* 13.3  HCT 41.6  < > 37.9* 40.0  MCV 97.9  < > 99.5 96.9  PLT 215  < > 204 219  < > = values in this interval not displayed. Cardiac Enzymes:  Recent Labs Lab 02/08/14 1745 02/08/14 2310 02/09/14 0308  TROPONINI <0.30 <0.30 <0.30   BNP: Invalid input(s): POCBNP CBG: No results for input(s): GLUCAP in the last 168 hours.   Micro Results: No results found for this or any previous visit (from the past 240 hour(s)).  Studies/Results: Dg Chest 2 View  02/09/2014   CLINICAL DATA:  Elevated B-type natriuretic peptide level. Initial encounter.  EXAM: CHEST  2 VIEW  COMPARISON:  Chest radiograph from 09/27/2012  FINDINGS: The lungs are well-aerated. Minimal left basilar atelectasis is noted. There is no evidence of pleural effusion or pneumothorax.  The heart is enlarged. No acute osseous abnormalities are seen. The patient is status post vertebroplasty at the lower thoracic spine.  IMPRESSION: Minimal left basilar atelectasis noted; lungs otherwise clear. Cardiomegaly.   Electronically Signed   By: Garald Balding M.D.   On: 02/09/2014 03:52   Ct Head Wo Contrast  02/08/2014   CLINICAL DATA:  Left-sided weakness. , fall. Slurred speech. Prior stroke.  EXAM: CT HEAD WITHOUT CONTRAST  TECHNIQUE: Contiguous axial images were obtained from the base of the skull through the vertex without intravenous contrast.  COMPARISON:  02/06/2013  FINDINGS: Old right cerebellar infarct, stable. Diffuse age related volume loss/atrophy. No acute intracranial abnormality. Specifically, no hemorrhage, hydrocephalus, mass lesion, acute infarction, or significant intracranial injury. No acute calvarial abnormality. Visualized paranasal sinuses and mastoids clear. Orbital soft tissues unremarkable.  IMPRESSION: No acute intracranial  abnormality.   Electronically Signed   By: Rolm Baptise M.D.   On: 02/08/2014 14:28   Mr Brain Wo Contrast  02/09/2014   CLINICAL DATA:  Initial evaluation for left-sided weakness.  EXAM: MRI HEAD WITHOUT CONTRAST  TECHNIQUE: Multiplanar, multiecho pulse sequences of the brain and surrounding structures were obtained without intravenous contrast.  COMPARISON:  Prior CT from 02/08/2014.  FINDINGS: Diffuse prominence of the CSF containing spaces is compatible with age-related generalized cerebral atrophy. Mild patchy T2/FLAIR hyperintensity seen within the periventricular and deep white matter both cerebral hemispheres, nonspecific, but likely related to mild chronic small vessel ischemic changes.  Remote bilateral cerebellar infarcts present, right larger than left. There are associated chronic blood products with the right cerebellar infarct. Additional remote infarcts seen within the high and posterior right frontal lobe. Moderate wallerian degeneration noted within the right middle cerebellar peduncle.  No abnormal foci of restricted diffusion to suggest acute intracranial infarct identified. Gray-white matter differentiation maintained. The right vertebral artery is likely occluded. Otherwise, normal intravascular flow voids present. No acute intracranial  hemorrhage.  No mass lesion, midline shift, or mass effect. Ventricles within normal limits without evidence of hydrocephalus. No extra-axial fluid collection.  Craniocervical junction within normal limits. Incidental note made of a partially empty sella. No acute abnormality seen about the orbits.  Paranasal sinuses and mastoid air cells are clear.  IMPRESSION: 1. No acute intracranial infarct or other process identified. 2. Remote bilateral cerebellar infarcts, right greater than left, with associated chronic blood products/hemorrhage in the inferior right cerebellar hemisphere. 3. Remote cortical infarct within the high posterior right frontal lobe. 4.  Moderate wallerian degeneration within the right middle cerebellar peduncle. 5. Atrophy with very mild chronic small vessel ischemic changes.   Electronically Signed   By: Jeannine Boga M.D.   On: 02/09/2014 03:14   Mr Thoracic Spine Wo Contrast  02/09/2014   CLINICAL DATA:  80 year old male with back pain. Recent falls. Initial encounter. History of chronic thoracic compression fractures. History of right vertebral artery occlusion and cerebellar infarcts.  History of prostate cancer metastatic to bone.  History of melanoma.  EXAM: MRI THORACIC AND LUMBAR SPINE WITHOUT CONTRAST  TECHNIQUE: Multiplanar and multiecho pulse sequences of the thoracic and lumbar spine were obtained without intravenous contrast.  COMPARISON:  Cervical and thoracic MRI 09/28/2012. CT Abdomen and Pelvis 04/27/2009.  FINDINGS: MR THORACIC SPINE FINDINGS  Limited sagittal imaging of the cervical spine is grossly stable. Chronic T6 and T11 compression fractures, the latter previously augmented. Stable thoracic vertebral height and alignment.  Resolved abnormal T1 hypo intensity in the T9 vertebral body since 2014. No marrow edema or evidence of acute osseous abnormality. No thoracic spine metastasis identified.  No thoracic spinal stenosis. Stable thoracic spinal cord with no abnormal cord signal.  Negative visualized posterior paraspinal soft tissues.  Stable visualized thoracic and upper abdominal viscera.  MR LUMBAR SPINE FINDINGS  Normal lumbar segmentation. Stable lumbar vertebral height and alignment since 2011. Chronic L2 superior endplate Schmorl node.  There is confluent abnormal marrow signal in the L4 vertebral body with early involvement of the anterior pedicle. The right vertebral body is affected encompassing an area of 34 x 23 by 30 mm (AP by transverse by CC). Associated T2 and FLAIR hyperintensity. No loss of L4 vertebral body height. No epidural or extraosseous extension.  No similar lesion identified elsewhere in  the lumbar spine or sacrum, but there is 80 31 mm diameter similar bone lesion in the medial right iliac bone on series 16, image 33.  Visualized lower thoracic spinal cord is normal with conus medularis at L1. Normal non contrast cauda equina nerve roots. No lumbar spinal stenosis.  Chronic infrarenal abdominal aortic aneurysm now measures up to 47 mm maximal diameter (40 mm in 2011).  Chronic left renal atrophy. Other visualized abdominal viscera are stable. The bladder is distended, partially visualized.  Posterior paraspinal muscle atrophy.  IMPRESSION: MR THORACIC SPINE IMPRESSION  1. No acute findings in the thoracic spine. No thoracic spinal stenosis. 2. Regressed T9 bone metastasis since 2014.  MR LUMBAR SPINE IMPRESSION  1. Bone metastases in the L4 vertebral body and medial right iliac bone measuring about 3 cm each. No pathologic fracture or extraosseous tumor identified. 2. Progressed chronic infrarenal abdominal aortic aneurysm since 2011, now 47 mm in diameter. Recommend followup by abdomen and pelvis CTA in 6 months, and vascular surgery referral/consultation if not already obtained. This recommendation follows ACR consensus guidelines: White Paper of the ACR Incidental Findings Committee II on Vascular Findings. J Am Coll Radiol 2013;  34:193-790. 3. No lumbar spinal stenosis.   Electronically Signed   By: Lars Pinks M.D.   On: 02/09/2014 11:53   Mr Lumbar Spine Wo Contrast  02/09/2014   CLINICAL DATA:  80 year old male with back pain. Recent falls. Initial encounter. History of chronic thoracic compression fractures. History of right vertebral artery occlusion and cerebellar infarcts.  History of prostate cancer metastatic to bone.  History of melanoma.  EXAM: MRI THORACIC AND LUMBAR SPINE WITHOUT CONTRAST  TECHNIQUE: Multiplanar and multiecho pulse sequences of the thoracic and lumbar spine were obtained without intravenous contrast.  COMPARISON:  Cervical and thoracic MRI 09/28/2012. CT Abdomen  and Pelvis 04/27/2009.  FINDINGS: MR THORACIC SPINE FINDINGS  Limited sagittal imaging of the cervical spine is grossly stable. Chronic T6 and T11 compression fractures, the latter previously augmented. Stable thoracic vertebral height and alignment.  Resolved abnormal T1 hypo intensity in the T9 vertebral body since 2014. No marrow edema or evidence of acute osseous abnormality. No thoracic spine metastasis identified.  No thoracic spinal stenosis. Stable thoracic spinal cord with no abnormal cord signal.  Negative visualized posterior paraspinal soft tissues.  Stable visualized thoracic and upper abdominal viscera.  MR LUMBAR SPINE FINDINGS  Normal lumbar segmentation. Stable lumbar vertebral height and alignment since 2011. Chronic L2 superior endplate Schmorl node.  There is confluent abnormal marrow signal in the L4 vertebral body with early involvement of the anterior pedicle. The right vertebral body is affected encompassing an area of 34 x 23 by 30 mm (AP by transverse by CC). Associated T2 and FLAIR hyperintensity. No loss of L4 vertebral body height. No epidural or extraosseous extension.  No similar lesion identified elsewhere in the lumbar spine or sacrum, but there is 80 31 mm diameter similar bone lesion in the medial right iliac bone on series 16, image 33.  Visualized lower thoracic spinal cord is normal with conus medularis at L1. Normal non contrast cauda equina nerve roots. No lumbar spinal stenosis.  Chronic infrarenal abdominal aortic aneurysm now measures up to 47 mm maximal diameter (40 mm in 2011).  Chronic left renal atrophy. Other visualized abdominal viscera are stable. The bladder is distended, partially visualized.  Posterior paraspinal muscle atrophy.  IMPRESSION: MR THORACIC SPINE IMPRESSION  1. No acute findings in the thoracic spine. No thoracic spinal stenosis. 2. Regressed T9 bone metastasis since 2014.  MR LUMBAR SPINE IMPRESSION  1. Bone metastases in the L4 vertebral body and  medial right iliac bone measuring about 3 cm each. No pathologic fracture or extraosseous tumor identified. 2. Progressed chronic infrarenal abdominal aortic aneurysm since 2011, now 47 mm in diameter. Recommend followup by abdomen and pelvis CTA in 6 months, and vascular surgery referral/consultation if not already obtained. This recommendation follows ACR consensus guidelines: White Paper of the ACR Incidental Findings Committee II on Vascular Findings. J Am Coll Radiol 2013; 10:789-794. 3. No lumbar spinal stenosis.   Electronically Signed   By: Lars Pinks M.D.   On: 02/09/2014 11:53   Ct Hip Left Wo Contrast  02/09/2014   CLINICAL DATA:  Acute onset of left-sided weakness and left hip pain. Four recent falls. Difficulty ambulating. Initial encounter.  EXAM: CT OF THE LEFT HIP WITHOUT CONTRAST  TECHNIQUE: Multidetector CT imaging of the left hip was performed according to the standard protocol. Multiplanar CT image reconstructions were also generated.  COMPARISON:  CT of the abdomen and pelvis performed 04/27/2009  FINDINGS: There is no evidence of fracture or dislocation. Prominent bone islands  are again noted at the left femoral head and neck. The left femoral head remains seated at the acetabulum. The left sacroiliac joint is unremarkable in appearance.  Mild focal bony expansion at the left inferior pubic ramus appears to reflect chronic sequelae of a remote lytic lesion at this location. Mild degenerative change is noted at the lower lumbar spine.  There is focal dilatation of the distal abdominal aorta to 3.4 cm in AP dimension and 4.7 cm in transverse dimension. Diffuse calcification is seen along the distal abdominal aorta and its branches. The prostate appears mildly enlarged. No additional soft tissue abnormalities are seen. Visualized small and large bowel loops are grossly unremarkable.  IMPRESSION: 1. No evidence of fracture or dislocation. 2. Mild focal bony expansion at the left inferior pubic  ramus reflects chronic sequelae of a remote lytic lesion at this location; there has been interval peripheral ossification, without evidence of a recurrent mass. 3. Focal dilatation of the distal abdominal aorta to 3.4 cm in AP dimension and 4.7 cm in transverse dimension, increased in size from 2011. 4. Diffuse calcification along the distal abdominal aorta and its branches. 5. Mildly enlarged prostate.   Electronically Signed   By: Garald Balding M.D.   On: 02/09/2014 04:08    Medications: Scheduled Meds: . acetaminophen  500 mg Oral TID  . amLODipine  5 mg Oral Daily  . aspirin  81 mg Oral Daily  . atorvastatin  10 mg Oral Daily  . B-complex with vitamin C  1 tablet Oral Daily  . dexamethasone  4 mg Intravenous 4 times per day  . docusate sodium  100 mg Oral BID  . ezetimibe  10 mg Oral Daily  . lactose free nutrition  237 mL Oral [id number]  . multivitamin with minerals  1 tablet Oral Daily  . omega-3 acid ethyl esters  1 g Oral Daily  . potassium chloride SA  10 mEq Oral Daily  . sodium chloride  3 mL Intravenous Q12H  . sodium chloride  3 mL Intravenous Q12H  . warfarin  2.5 mg Oral Once per day on Sun Tue Thu Sat  . warfarin  5 mg Oral Once per day on Mon Wed Fri  . Warfarin - Pharmacist Dosing Inpatient   Does not apply q1800      LOS: 3 days   Omauri Boeve M.D. [facility] 02/11/2014, 10:10 AM Pager: [phone]  If 7PM-7AM, please contact night-coverage [email or web address] Password [email or web address]

## 2014-02-11 NOTE — Progress Notes (Signed)
Echocardiogram 2D Echocardiogram with Definity has been performed.  Craig [name] 02/11/2014, 12:26 PM

## 2014-02-11 NOTE — Progress Notes (Addendum)
ANTICOAGULATION CONSULT NOTE - Follow Up Consult  Pharmacy Consult for Coumadin Indication: atrial fibrillation andCVA  Allergies  Allergen Reactions  . Pravachol     Muscle weakness  . Zocor [Simvastatin - High Dose]     Muscle weakness    Patient Measurements: Height: 6' (182.9 cm) Weight: 183 lb 3.2 oz (83.1 kg) IBW/kg (Calculated) : 77.6 Heparin Dosing Weight:   Vital Signs: Temp: 97.6 F (36.4 C) (12/21 0520) Temp Source: Oral (12/21 0520) BP: 158/76 mmHg (12/21 1017) Pulse Rate: 76 (12/21 1017)  Labs:  Recent Labs  02/08/14 1745 02/08/14 2310 02/09/14 0308 02/10/14 0332 02/11/14 0706 02/11/14 0840  HGB  --   --  13.7 12.4*  --  13.3  HCT  --   --  41.2 37.9*  --  40.0  PLT  --   --  219 204  --  219  LABPROT  --   --  22.2* 24.0* 23.0*  --   INR  --   --  1.92* 2.13* 2.02*  --   CREATININE  --   --  1.46* 1.61*  --  1.43*  TROPONINI <0.30 <0.30 <0.30  --   --   --     Estimated Creatinine Clearance: 39.9 mL/min (by C-G formula based on Cr of 1.43).  Assessment: Weakness, fatigue, increased falls (bony mets)  80 yo who was admitted for left sided weakness. He also has a hx of afib on chronic coumadin. Cards goal of INR is lower for his case (1.8-2.2).  Anticoagulation: Warf-Rx: hx Afib, CVA; INR 2.13 (goal 1.8-2.2 per CC). INR 2.02 today. Hgb improved 13.3. Plts 219 ok. Hematoma R forearm noted from fall 12/8. - PTA coumadin = 2.5mg  qday except 5mg  MWF  Infectious Disease: Afeb, WBC 13>>11.2- no abx, f/u for infectious signs  Cardiovascular: Afib, HLD, CHF, HTN; BP elevated, HR 54-76, CE (-) x 3 - Norvasc5, ASA81, Lipitor10, Zetia, Lovaza, K+   Endocrinology - BG 152; Dexamethasone started for spinal mets  Gastrointestinal / Nutrition: GERD; LFTs wnl  Neurology: Hx ICH, CVA - weakness more due to pain in back;  MRI brain - no acute infarct - MRI thoracic - few vertebral metastases, progressed AAA - Flexeril trial --> sedated pt too much, now just  prn  Nephrology: Stage III CKD, Scr 1.43 down slightly.   Hematology / Oncology: Hx prostate CA - chemo on hold, MRI showed some spinal metastases --> Radiation Onc consult + Dexa started  PTA Medication Issues: tylenol 500 BID, Ziac, Xgeva, Docusate, Xtandi, Lasix, Lupron, Hyzaar, MV, Vesicare  Best Practices   Goal of Therapy:  INR 1.8-2.2 Monitor platelets by anticoagulation protocol: Yes   Plan:  1. Continue Coumadin 2.5mg  qday except 5mg  MWF 2. Daily INR   Lataja Newland S. Alford Highland, PharmD, BCPS Clinical Staff Pharmacist Pager [phone]  Eilene Ghazi Stillinger 02/11/2014,10:30 AM

## 2014-02-11 NOTE — Progress Notes (Signed)
The patient's HR briefly went down to the upper 30s for a few seconds at the beginning of the shift.  The patient was asymptomatic and asleep at the time.  His VS were stable.  The MD was text paged.  His HR stayed in the 40s-60s the rest of the night. The patient and his wife did not want him to have the night time dose of tramadol because it made him a little drowsy during the day.  He did not have any complaints of pain overnight.

## 2014-02-12 ENCOUNTER — Ambulatory Visit
Admit: 2014-02-12 | Discharge: 2014-02-12 | Disposition: A | Discharge status: Home / Self Care | Payer: Medicare Other | Attending: Radiation Oncology | Admitting: Radiation Oncology

## 2014-02-12 ENCOUNTER — Telehealth: Payer: Self-pay | Admitting: *Deleted

## 2014-02-12 DIAGNOSIS — C61 Malignant neoplasm of prostate: Secondary | ICD-10-CM | POA: Insufficient documentation

## 2014-02-12 DIAGNOSIS — C7951 Secondary malignant neoplasm of bone: Secondary | ICD-10-CM

## 2014-02-12 DIAGNOSIS — Z510 Encounter for antineoplastic radiation therapy: Secondary | ICD-10-CM | POA: Insufficient documentation

## 2014-02-12 LAB — BASIC METABOLIC PANEL
ANION GAP: 10 (ref 5–15)
BUN: 76 mg/dL — AB (ref 6–23)
CALCIUM: 8.7 mg/dL (ref 8.4–10.5)
CO2: 25 mmol/L (ref 19–32)
CREATININE: 1.6 mg/dL — AB (ref 0.50–1.35)
Chloride: 100 mEq/L (ref 96–112)
GFR, EST AFRICAN AMERICAN: 43 mL/min — AB (ref >90)
GFR, EST NON AFRICAN AMERICAN: 37 mL/min — AB (ref >90)
Glucose, Bld: 156 mg/dL — ABNORMAL HIGH (ref 70–99)
Potassium: 4.3 mmol/L (ref 3.5–5.1)
Sodium: 135 mmol/L (ref 135–145)

## 2014-02-12 LAB — PROTIME-INR
INR: 2.14 — AB (ref 0.00–1.49)
PROTHROMBIN TIME: 24.1 s — AB (ref 11.6–15.2)

## 2014-02-12 MED ORDER — PANTOPRAZOLE SODIUM 40 MG PO TBEC: 40.0000 mg | DELAYED_RELEASE_TABLET | Freq: Every day | ORAL | Status: DC

## 2014-02-12 MED ORDER — CYCLOBENZAPRINE HCL 5 MG PO TABS
5.0000 mg | ORAL_TABLET | Freq: Every day | ORAL | Status: DC | PRN
  Filled 2014-02-12: qty 1

## 2014-02-12 MED ORDER — OXYCODONE-ACETAMINOPHEN 5-325 MG PO TABS: 1.0000 | ORAL_TABLET | Freq: Four times a day (QID) | ORAL | Status: DC | PRN

## 2014-02-12 MED ORDER — METOPROLOL SUCCINATE ER 50 MG PO TB24: 50.0000 mg | ORAL_TABLET | Freq: Every day | ORAL | Status: DC

## 2014-02-12 MED ORDER — BOOST PLUS PO LIQD
237.0000 mL | ORAL | Status: DC
  Administered 2014-02-13: 237 mL via ORAL
  Filled 2014-02-12 (×2): qty 237

## 2014-02-12 MED ORDER — AMLODIPINE BESYLATE 5 MG PO TABS: 5.0000 mg | ORAL_TABLET | Freq: Every day | ORAL | Status: DC

## 2014-02-12 MED ORDER — CYCLOBENZAPRINE HCL 5 MG PO TABS: 5.0000 mg | ORAL_TABLET | Freq: Every day | ORAL | Status: DC | PRN

## 2014-02-12 MED ORDER — DEXAMETHASONE 4 MG PO TABS: 4.0000 mg | ORAL_TABLET | Freq: Three times a day (TID) | ORAL | Status: DC

## 2014-02-12 NOTE — Progress Notes (Signed)
I SAW THAT THE PATIENT HAD TELEMETRY ORDERS AND CALLED THE NURSE BACK AFTER SHE GAVE ME REPORT ON HIM.  I TOLD HER THAT SHE NEEDED TO CALL THE DOCTOR AND SEE IF HE STILL NEEDED TELEMETRY.  I TOLD THE CHARGE NURSE AND SHE CALLED THE NURSE BACK AT [facility] AND SHE SAID THAT SHE NEEDED TO CALL THE DOCTOR AGAIN AND FIND OUT.  I CALLED DR TAT WHO IS RECEIVING HIM AND HE SAID IF THE DOCTOR THERE HAD HIM ON TELEMETRY THAT HE NEEDED TO STAY ON TELEMETRY.  Lorrene Reid

## 2014-02-12 NOTE — Progress Notes (Signed)
Patient ID: Craig [name]  male  ZSW:[id number]    DOB: 12/20/1924    DOA: 02/08/2014  PCP: [name], MD  Brief history of present illness  Patient is a 80 year old male, retired [employer], with hypertension, atrial fibrillation, prior history of stroke presented with left-sided weakness that started on the morning of admission. Per his wife, patient fell on December 8, hit his back and has hematoma in the right forearm. Last Saturday, 7 days ago he fell 3 times, hit his left knee. Subsequently he saw his orthopedic physician, x-ray of his left knee and hip were negative. Patient had steroid injection in the left knee. After the injection he was doing better and was able to use his walker. A day prior to admission patient was noticed to be sluggish and woke up with left-sided weakness difficulty with ambulation. Otherwise no acute slurred speech, no vision changes confusion or any focal neurological deficits. Patient was on Casodex and chemotherapy for prostrate cancer which was stopped last week due to concerns of causing weakness.  Assessment/Plan: Principal Problem:  Back, left hip/thigh pain Weakness with left sided weakness: The weakness appears to be more due to pain in the lower back radiating to the left hip and thigh - MRI of the brain was done which showed remote cerebellar infarct and cortical infarct in the right frontal lobe however no new acute infarct. - CT of the left hip was negative for any fracture or dislocation. Placed on Flexeril prn, scheduled Tylenol, Percocet as needed. Patient did not tolerate tramadol as he became very lethargic and bradycardiac with that. Tramadol was discontinued. - Given his history of prostrate cancer, MRI thoracic and lumbar spine done and showed bony metastases in the L4 vertebral body, medial right iliac bone measuring about 3 cm each, no pathological fracture or tumor identified.  - I had discussed with oncology, Dr. Julien Nordmann,  recommended radiation therapy. I called radiation oncology consult, discussed with Dr Isidore Moos, patient was seen by Dr. Lisbeth Renshaw yesterday, recommended simulation today and first radiation treatment on 12/23. Continue IV Decadron. No role for neurosurgery per Dr. Cyndy Freeze.   - patient was seen by physical therapy, recommended home health PT, OT, HHA. Case management consult was obtained and arrange with [facility]. - Patient will be transferred to [facility] today for simulation and first radiation therapy tomorrow 12/23. Patient can be discharged after the radiation therapy tomorrow, he will receive further treatments outpatient.   Active Problems: Elevated BNP - Currently euvolemic, cardiology consulted - 2-D echo was done yesterday, results still pending    Atrial fibrillation - Rate control, continue warfarin - Patient had bradycardia issue due to tramadol, currently stable  - Cardiology has been following     Hypercholesterolemia - Continue statin    Prostate cancer - Currently chemotherapy on hold, defer to outpatient oncologist, Dr. Alen Blew  Mild acute on chronic renal insufficiency- improving - worsened due to enablex which was discontinued. Hold the Lasix, losartan, HCTZ.    Code Status:Full code  Family Communication: Discussed in detail with patient's wife at the bedside  Disposition:  transferred to [facility] today, hopefully DC home tomorrow after the radiation therapy  Consultants   Cardiology  Dr. Malachy Moan Onc Dr. Lisbeth Renshaw  Procedures : MRI of the lumbar spine 2-D echo  Antibiotics:  None     Subjective;    Patient seen and examined, feels better, denies any pain. Wife at the bedside.   Objective: Weight  change: 1.677 kg (3 lb 11.2 oz)  Intake/Output Summary (Last 24 hours) at 02/12/14 1107 Last data filed at 02/12/14 1062  Gross per 24 hour  Intake    960 ml  Output    975 ml  Net    -15 ml   Blood pressure 169/87,  pulse 62, temperature 97.7 F (36.5 C), temperature source Oral, resp. rate 18, height 6' (1.829 m), weight 84.777 kg (186 lb 14.4 oz), SpO2 99 %.  Physical Exam: General: Alert and awake, oriented x3, NAD CVS: Irregularly regular, 2/6 systolic murmur Chest: CTAB, no wheezing, rales or rhonchi Abdomen: soft NT, ND, NBS  Extremities: no c/c/e bilaterally   Lab Results: Basic Metabolic Panel:  Recent Labs Lab 02/11/14 0840 02/12/14 0348  NA 140 135  K 4.5 4.3  CL 98 100  CO2 28 25  GLUCOSE 152* 156*  BUN 70* 76*  CREATININE 1.43* 1.60*  CALCIUM 9.7 8.7   Liver Function Tests:  Recent Labs Lab 02/09/14 0308  AST 14  ALT 13  ALKPHOS 62  BILITOT 0.8  PROT 7.0  ALBUMIN 2.9*   No results for input(s): LIPASE, AMYLASE in the last 168 hours. No results for input(s): AMMONIA in the last 168 hours. CBC:  Recent Labs Lab 02/08/14 1148  02/10/14 0332 02/11/14 0840  WBC 9.9  < > 13.2* 11.3*  NEUTROABS 7.1  --   --   --   HGB 13.5  < > 12.4* 13.3  HCT 41.6  < > 37.9* 40.0  MCV 97.9  < > 99.5 96.9  PLT 215  < > 204 219  < > = values in this interval not displayed. Cardiac Enzymes:  Recent Labs Lab 02/08/14 1745 02/08/14 2310 02/09/14 0308  TROPONINI <0.30 <0.30 <0.30   BNP: Invalid input(s): POCBNP CBG: No results for input(s): GLUCAP in the last 168 hours.   Micro Results: No results found for this or any previous visit (from the past 240 hour(s)).  Studies/Results: Dg Chest 2 View  02/09/2014   CLINICAL DATA:  Elevated B-type natriuretic peptide level. Initial encounter.  EXAM: CHEST  2 VIEW  COMPARISON:  Chest radiograph from 09/27/2012  FINDINGS: The lungs are well-aerated. Minimal left basilar atelectasis is noted. There is no evidence of pleural effusion or pneumothorax.  The heart is enlarged. No acute osseous abnormalities are seen. The patient is status post vertebroplasty at the lower thoracic spine.  IMPRESSION: Minimal left basilar atelectasis  noted; lungs otherwise clear. Cardiomegaly.   Electronically Signed   By: Garald Balding M.D.   On: 02/09/2014 03:52   Ct Head Wo Contrast  02/08/2014   CLINICAL DATA:  Left-sided weakness. , fall. Slurred speech. Prior stroke.  EXAM: CT HEAD WITHOUT CONTRAST  TECHNIQUE: Contiguous axial images were obtained from the base of the skull through the vertex without intravenous contrast.  COMPARISON:  02/06/2013  FINDINGS: Old right cerebellar infarct, stable. Diffuse age related volume loss/atrophy. No acute intracranial abnormality. Specifically, no hemorrhage, hydrocephalus, mass lesion, acute infarction, or significant intracranial injury. No acute calvarial abnormality. Visualized paranasal sinuses and mastoids clear. Orbital soft tissues unremarkable.  IMPRESSION: No acute intracranial abnormality.   Electronically Signed   By: Rolm Baptise M.D.   On: 02/08/2014 14:28   Mr Brain Wo Contrast  02/09/2014   CLINICAL DATA:  Initial evaluation for left-sided weakness.  EXAM: MRI HEAD WITHOUT CONTRAST  TECHNIQUE: Multiplanar, multiecho pulse sequences of the brain and surrounding structures were obtained without intravenous contrast.  COMPARISON:  Prior CT from 02/08/2014.  FINDINGS: Diffuse prominence of the CSF containing spaces is compatible with age-related generalized cerebral atrophy. Mild patchy T2/FLAIR hyperintensity seen within the periventricular and deep white matter both cerebral hemispheres, nonspecific, but likely related to mild chronic small vessel ischemic changes.  Remote bilateral cerebellar infarcts present, right larger than left. There are associated chronic blood products with the right cerebellar infarct. Additional remote infarcts seen within the high and posterior right frontal lobe. Moderate wallerian degeneration noted within the right middle cerebellar peduncle.  No abnormal foci of restricted diffusion to suggest acute intracranial infarct identified. Gray-white matter  differentiation maintained. The right vertebral artery is likely occluded. Otherwise, normal intravascular flow voids present. No acute intracranial hemorrhage.  No mass lesion, midline shift, or mass effect. Ventricles within normal limits without evidence of hydrocephalus. No extra-axial fluid collection.  Craniocervical junction within normal limits. Incidental note made of a partially empty sella. No acute abnormality seen about the orbits.  Paranasal sinuses and mastoid air cells are clear.  IMPRESSION: 1. No acute intracranial infarct or other process identified. 2. Remote bilateral cerebellar infarcts, right greater than left, with associated chronic blood products/hemorrhage in the inferior right cerebellar hemisphere. 3. Remote cortical infarct within the high posterior right frontal lobe. 4. Moderate wallerian degeneration within the right middle cerebellar peduncle. 5. Atrophy with very mild chronic small vessel ischemic changes.   Electronically Signed   By: Jeannine Boga M.D.   On: 02/09/2014 03:14   Mr Thoracic Spine Wo Contrast  02/09/2014   CLINICAL DATA:  80 year old male with back pain. Recent falls. Initial encounter. History of chronic thoracic compression fractures. History of right vertebral artery occlusion and cerebellar infarcts.  History of prostate cancer metastatic to bone.  History of melanoma.  EXAM: MRI THORACIC AND LUMBAR SPINE WITHOUT CONTRAST  TECHNIQUE: Multiplanar and multiecho pulse sequences of the thoracic and lumbar spine were obtained without intravenous contrast.  COMPARISON:  Cervical and thoracic MRI 09/28/2012. CT Abdomen and Pelvis 04/27/2009.  FINDINGS: MR THORACIC SPINE FINDINGS  Limited sagittal imaging of the cervical spine is grossly stable. Chronic T6 and T11 compression fractures, the latter previously augmented. Stable thoracic vertebral height and alignment.  Resolved abnormal T1 hypo intensity in the T9 vertebral body since 2014. No marrow edema or  evidence of acute osseous abnormality. No thoracic spine metastasis identified.  No thoracic spinal stenosis. Stable thoracic spinal cord with no abnormal cord signal.  Negative visualized posterior paraspinal soft tissues.  Stable visualized thoracic and upper abdominal viscera.  MR LUMBAR SPINE FINDINGS  Normal lumbar segmentation. Stable lumbar vertebral height and alignment since 2011. Chronic L2 superior endplate Schmorl node.  There is confluent abnormal marrow signal in the L4 vertebral body with early involvement of the anterior pedicle. The right vertebral body is affected encompassing an area of 34 x 23 by 30 mm (AP by transverse by CC). Associated T2 and FLAIR hyperintensity. No loss of L4 vertebral body height. No epidural or extraosseous extension.  No similar lesion identified elsewhere in the lumbar spine or sacrum, but there is 80 31 mm diameter similar bone lesion in the medial right iliac bone on series 16, image 33.  Visualized lower thoracic spinal cord is normal with conus medularis at L1. Normal non contrast cauda equina nerve roots. No lumbar spinal stenosis.  Chronic infrarenal abdominal aortic aneurysm now measures up to 47 mm maximal diameter (40 mm in 2011).  Chronic left renal atrophy. Other visualized abdominal viscera are stable. The  bladder is distended, partially visualized.  Posterior paraspinal muscle atrophy.  IMPRESSION: MR THORACIC SPINE IMPRESSION  1. No acute findings in the thoracic spine. No thoracic spinal stenosis. 2. Regressed T9 bone metastasis since 2014.  MR LUMBAR SPINE IMPRESSION  1. Bone metastases in the L4 vertebral body and medial right iliac bone measuring about 3 cm each. No pathologic fracture or extraosseous tumor identified. 2. Progressed chronic infrarenal abdominal aortic aneurysm since 2011, now 47 mm in diameter. Recommend followup by abdomen and pelvis CTA in 6 months, and vascular surgery referral/consultation if not already obtained. This  recommendation follows ACR consensus guidelines: White Paper of the ACR Incidental Findings Committee II on Vascular Findings. J Am Coll Radiol 2013; 10:789-794. 3. No lumbar spinal stenosis.   Electronically Signed   By: Lars Pinks M.D.   On: 02/09/2014 11:53   Mr Lumbar Spine Wo Contrast  02/09/2014   CLINICAL DATA:  80 year old male with back pain. Recent falls. Initial encounter. History of chronic thoracic compression fractures. History of right vertebral artery occlusion and cerebellar infarcts.  History of prostate cancer metastatic to bone.  History of melanoma.  EXAM: MRI THORACIC AND LUMBAR SPINE WITHOUT CONTRAST  TECHNIQUE: Multiplanar and multiecho pulse sequences of the thoracic and lumbar spine were obtained without intravenous contrast.  COMPARISON:  Cervical and thoracic MRI 09/28/2012. CT Abdomen and Pelvis 04/27/2009.  FINDINGS: MR THORACIC SPINE FINDINGS  Limited sagittal imaging of the cervical spine is grossly stable. Chronic T6 and T11 compression fractures, the latter previously augmented. Stable thoracic vertebral height and alignment.  Resolved abnormal T1 hypo intensity in the T9 vertebral body since 2014. No marrow edema or evidence of acute osseous abnormality. No thoracic spine metastasis identified.  No thoracic spinal stenosis. Stable thoracic spinal cord with no abnormal cord signal.  Negative visualized posterior paraspinal soft tissues.  Stable visualized thoracic and upper abdominal viscera.  MR LUMBAR SPINE FINDINGS  Normal lumbar segmentation. Stable lumbar vertebral height and alignment since 2011. Chronic L2 superior endplate Schmorl node.  There is confluent abnormal marrow signal in the L4 vertebral body with early involvement of the anterior pedicle. The right vertebral body is affected encompassing an area of 34 x 23 by 30 mm (AP by transverse by CC). Associated T2 and FLAIR hyperintensity. No loss of L4 vertebral body height. No epidural or extraosseous extension.  No  similar lesion identified elsewhere in the lumbar spine or sacrum, but there is 80 31 mm diameter similar bone lesion in the medial right iliac bone on series 16, image 33.  Visualized lower thoracic spinal cord is normal with conus medularis at L1. Normal non contrast cauda equina nerve roots. No lumbar spinal stenosis.  Chronic infrarenal abdominal aortic aneurysm now measures up to 47 mm maximal diameter (40 mm in 2011).  Chronic left renal atrophy. Other visualized abdominal viscera are stable. The bladder is distended, partially visualized.  Posterior paraspinal muscle atrophy.  IMPRESSION: MR THORACIC SPINE IMPRESSION  1. No acute findings in the thoracic spine. No thoracic spinal stenosis. 2. Regressed T9 bone metastasis since 2014.  MR LUMBAR SPINE IMPRESSION  1. Bone metastases in the L4 vertebral body and medial right iliac bone measuring about 3 cm each. No pathologic fracture or extraosseous tumor identified. 2. Progressed chronic infrarenal abdominal aortic aneurysm since 2011, now 47 mm in diameter. Recommend followup by abdomen and pelvis CTA in 6 months, and vascular surgery referral/consultation if not already obtained. This recommendation follows ACR consensus guidelines: White Paper of  the ACR Incidental Findings Committee II on Vascular Findings. J Am Coll Radiol 2013; 10:789-794. 3. No lumbar spinal stenosis.   Electronically Signed   By: Lars Pinks M.D.   On: 02/09/2014 11:53   Ct Hip Left Wo Contrast  02/09/2014   CLINICAL DATA:  Acute onset of left-sided weakness and left hip pain. Four recent falls. Difficulty ambulating. Initial encounter.  EXAM: CT OF THE LEFT HIP WITHOUT CONTRAST  TECHNIQUE: Multidetector CT imaging of the left hip was performed according to the standard protocol. Multiplanar CT image reconstructions were also generated.  COMPARISON:  CT of the abdomen and pelvis performed 04/27/2009  FINDINGS: There is no evidence of fracture or dislocation. Prominent bone islands are  again noted at the left femoral head and neck. The left femoral head remains seated at the acetabulum. The left sacroiliac joint is unremarkable in appearance.  Mild focal bony expansion at the left inferior pubic ramus appears to reflect chronic sequelae of a remote lytic lesion at this location. Mild degenerative change is noted at the lower lumbar spine.  There is focal dilatation of the distal abdominal aorta to 3.4 cm in AP dimension and 4.7 cm in transverse dimension. Diffuse calcification is seen along the distal abdominal aorta and its branches. The prostate appears mildly enlarged. No additional soft tissue abnormalities are seen. Visualized small and large bowel loops are grossly unremarkable.  IMPRESSION: 1. No evidence of fracture or dislocation. 2. Mild focal bony expansion at the left inferior pubic ramus reflects chronic sequelae of a remote lytic lesion at this location; there has been interval peripheral ossification, without evidence of a recurrent mass. 3. Focal dilatation of the distal abdominal aorta to 3.4 cm in AP dimension and 4.7 cm in transverse dimension, increased in size from 2011. 4. Diffuse calcification along the distal abdominal aorta and its branches. 5. Mildly enlarged prostate.   Electronically Signed   By: Garald Balding M.D.   On: 02/09/2014 04:08    Medications: Scheduled Meds: . acetaminophen  500 mg Oral TID  . amLODipine  5 mg Oral Daily  . atorvastatin  10 mg Oral Daily  . B-complex with vitamin C  1 tablet Oral Daily  . dexamethasone  4 mg Intravenous 4 times per day  . docusate sodium  100 mg Oral BID  . ezetimibe  10 mg Oral Daily  . lactose free nutrition  237 mL Oral BH-q7a  . multivitamin with minerals  1 tablet Oral Daily  . omega-3 acid ethyl esters  1 g Oral Daily  . potassium chloride SA  10 mEq Oral Daily  . sodium chloride  3 mL Intravenous Q12H  . warfarin  2.5 mg Oral Once per day on Sun Tue Thu Sat  . warfarin  5 mg Oral Once per day on Mon  Wed Fri  . Warfarin - Pharmacist Dosing Inpatient   Does not apply q1800      LOS: 4 days   RAI,RIPUDEEP M.D. [facility] 02/12/2014, 11:07 AM Pager: [phone]  If 7PM-7AM, please contact night-coverage [email or web address] Password [email or web address]

## 2014-02-12 NOTE — Consult Note (Signed)
Radiation Oncology         [phone]) [phone] ________________________________  Name: Craig [name]. MRN: [id number]  Date: 02/08/2014  DOB: 14-Nov-1924  INPATIENT  DIAGNOSIS: Diagnoses of Left-sided weakness, Pain, Elevated brain natriuretic peptide (BNP) level, and Back pain were pertinent to this visit.   HISTORY OF PRESENT ILLNESS::Craig H Aura Dials. is a 80 y.o. male who is seen for an initial consultation visit regarding the patient's diagnosis of metastatic prostate cancer.  The patient presented with progressive weakness. By report, he has had a prior history of stroke. The patient has fallen several times in recent weeks and was seen by his orthopedic physician for workup of a left knee and hip injury which were negative for significant acute injury. The patient has had some lower back pain radiating to the left hip and thigh. All month, patient's workup has included a brain MRI scan which was negative for acute process. The patient also underwent an MRI scan of the thoracic and lumbar spine. No acute findings in the thoracic spine. Bone metastasis was seen in the L4 vertebral body as well as the medial right iliac bone. Both of these measured 3 cm. The patient's case was reviewed in multidisciplinary brain/fine conference. He was felt to be a good candidate to proceed with radiation treatment to these areas. Both of these areas can be treated with a short course of radiosurgery.    PREVIOUS RADIATION THERAPY: No   PAST MEDICAL HISTORY:  has a past medical history of Hyperlipidemia; Hypokalemia; Hypertensive heart disease; Chronic back pain; History of epistaxis (07/19/2002); Personal history of long-term (current) use of anticoagulants; ICH (intracerebral hemorrhage) (~ 1999); Hypertension; Atrial fibrillation; PAT (paroxysmal atrial tachycardia); Pneumonia; GERD (gastroesophageal reflux disease); H/O hiatal hernia; Migraines; Stroke (~ 1999); Melanoma; Prostate cancer, primary, with  metastasis from prostate to other site; and Osteoarthritis of both feet.     PAST SURGICAL HISTORY: Past Surgical History  Procedure Laterality Date  . Nasal sinus surgery  1998  . Cataract extraction w/ intraocular lens  implant, bilateral Bilateral ~ 2011  . Skin graft Right 2010  . Melanoma excision  2010    "pre-melanoma on top of head; did skin graft from left thigh to cover" (09/27/2012)  . Tonsillectomy  ~ 1935     FAMILY HISTORY: family history includes Heart attack in his father; Heart failure in his mother.   SOCIAL HISTORY:  reports that he quit smoking about 61 years ago. His smoking use included Cigarettes. He smoked 0.00 packs per day for .5 years. He has never used smokeless tobacco. He reports that he drinks alcohol. He reports that he does not use illicit drugs.   ALLERGIES: Pravachol and Zocor   MEDICATIONS:  Current Facility-Administered Medications  Medication Dose Route Frequency Provider Last Rate Last Dose  . acetaminophen (TYLENOL) tablet 500 mg  500 mg Oral TID Ripudeep Krystal Eaton, MD   500 mg at 02/11/14 2222  . amLODipine (NORVASC) tablet 5 mg  5 mg Oral Daily [name], MD   5 mg at 02/11/14 1017  . aspirin chewable tablet 81 mg  81 mg Oral Daily Belkys A Regalado, MD   81 mg at 02/11/14 1017  . atorvastatin (LIPITOR) tablet 10 mg  10 mg Oral Daily Belkys A Regalado, MD   10 mg at 02/11/14 1017  . B-complex with vitamin C tablet 1 tablet  1 tablet Oral Daily Belkys A Regalado, MD   1 tablet at 02/11/14 1018  . cyclobenzaprine (  FLEXERIL) tablet 5 mg  5 mg Oral TID PRN Ripudeep K Rai, MD      . dexamethasone (DECADRON) injection 4 mg  4 mg Intravenous 4 times per day Ripudeep Krystal Eaton, MD   4 mg at 02/12/14 1610  . docusate sodium (COLACE) capsule 100 mg  100 mg Oral BID Belkys A Regalado, MD   100 mg at 02/11/14 1018  . ezetimibe (ZETIA) tablet 10 mg  10 mg Oral Daily Belkys A Regalado, MD   10 mg at 02/11/14 1017  . lactose free nutrition (BOOST PLUS)  liquid 237 mL  237 mL Oral BH-q7a Ripudeep K Rai, MD      . multivitamin with minerals tablet 1 tablet  1 tablet Oral Daily Belkys A Regalado, MD   1 tablet at 02/11/14 1017  . omega-3 acid ethyl esters (LOVAZA) capsule 1 g  1 g Oral Daily Belkys A Regalado, MD   1 g at 02/11/14 1018  . oxyCODONE-acetaminophen (PERCOCET/ROXICET) 5-325 MG per tablet 1 tablet  1 tablet Oral Q6H PRN Ripudeep K Rai, MD      . potassium chloride (K-DUR,KLOR-CON) CR tablet 10 mEq  10 mEq Oral Daily Belkys A Regalado, MD   10 mEq at 02/11/14 1023  . sodium chloride 0.9 % injection 3 mL  3 mL Intravenous Q12H Belkys A Regalado, MD   3 mL at 02/11/14 1019  . warfarin (COUMADIN) tablet 2.5 mg  2.5 mg Oral Once per day on Sun Tue Thu Sat Belkys A Regalado, MD   2.5 mg at 02/10/14 1807  . warfarin (COUMADIN) tablet 5 mg  5 mg Oral Once per day on Mon Wed Fri Elmarie Shiley, MD   5 mg at 02/11/14 1759  . Warfarin - Pharmacist Dosing Inpatient   Does not apply Warren, MD         REVIEW OF SYSTEMS:  A 15 point review of systems is documented in the electronic medical record. This was obtained by the nursing staff. However, I reviewed this with the patient to discuss relevant findings and make appropriate changes.  Pertinent items are noted in HPI.    PHYSICAL EXAM:  height is 6' (1.829 m) and weight is 186 lb 14.4 oz (84.777 kg). His oral temperature is 97.7 F (36.5 C). His blood pressure is 169/87 and his pulse is 62. His respiration is 18 and oxygen saturation is 99%.   ECOG = 1  0 - Asymptomatic (Fully active, able to carry on all predisease activities without restriction)  1 - Symptomatic but completely ambulatory (Restricted in physically strenuous activity but ambulatory and able to carry out work of a light or sedentary nature. For example, light housework, office work)  2 - Symptomatic, <50% in bed during the day (Ambulatory and capable of all self care but unable to carry out any work  activities. Up and about more than 50% of waking hours)  3 - Symptomatic, >50% in bed, but not bedbound (Capable of only limited self-care, confined to bed or chair 50% or more of waking hours)  4 - Bedbound (Completely disabled. Cannot carry on any self-care. Totally confined to bed or chair)  5 - Death   Eustace Pen MM, Creech RH, Tormey DC, et al. [phone]). "Toxicity and response criteria of the [facility] Group". [place] Oncol. 5 (6): 649-55     LABORATORY DATA:  Lab Results  Component Value Date   WBC 11.3* 02/11/2014   HGB 13.3  02/11/2014   HCT 40.0 02/11/2014   MCV 96.9 02/11/2014   PLT 219 02/11/2014   Lab Results  Component Value Date   NA 135 02/12/2014   K 4.3 02/12/2014   CL 100 02/12/2014   CO2 25 02/12/2014   Lab Results  Component Value Date   ALT 13 02/09/2014   AST 14 02/09/2014   ALKPHOS 62 02/09/2014   BILITOT 0.8 02/09/2014      RADIOGRAPHY: Dg Chest 2 View  02/09/2014   CLINICAL DATA:  Elevated B-type natriuretic peptide level. Initial encounter.  EXAM: CHEST  2 VIEW  COMPARISON:  Chest radiograph from 09/27/2012  FINDINGS: The lungs are well-aerated. Minimal left basilar atelectasis is noted. There is no evidence of pleural effusion or pneumothorax.  The heart is enlarged. No acute osseous abnormalities are seen. The patient is status post vertebroplasty at the lower thoracic spine.  IMPRESSION: Minimal left basilar atelectasis noted; lungs otherwise clear. Cardiomegaly.   Electronically Signed   By: Garald Balding M.D.   On: 02/09/2014 03:52   Ct Head Wo Contrast  02/08/2014   CLINICAL DATA:  Left-sided weakness. , fall. Slurred speech. Prior stroke.  EXAM: CT HEAD WITHOUT CONTRAST  TECHNIQUE: Contiguous axial images were obtained from the base of the skull through the vertex without intravenous contrast.  COMPARISON:  02/06/2013  FINDINGS: Old right cerebellar infarct, stable. Diffuse age related volume loss/atrophy. No acute  intracranial abnormality. Specifically, no hemorrhage, hydrocephalus, mass lesion, acute infarction, or significant intracranial injury. No acute calvarial abnormality. Visualized paranasal sinuses and mastoids clear. Orbital soft tissues unremarkable.  IMPRESSION: No acute intracranial abnormality.   Electronically Signed   By: Rolm Baptise M.D.   On: 02/08/2014 14:28   Mr Brain Wo Contrast  02/09/2014   CLINICAL DATA:  Initial evaluation for left-sided weakness.  EXAM: MRI HEAD WITHOUT CONTRAST  TECHNIQUE: Multiplanar, multiecho pulse sequences of the brain and surrounding structures were obtained without intravenous contrast.  COMPARISON:  Prior CT from 02/08/2014.  FINDINGS: Diffuse prominence of the CSF containing spaces is compatible with age-related generalized cerebral atrophy. Mild patchy T2/FLAIR hyperintensity seen within the periventricular and deep white matter both cerebral hemispheres, nonspecific, but likely related to mild chronic small vessel ischemic changes.  Remote bilateral cerebellar infarcts present, right larger than left. There are associated chronic blood products with the right cerebellar infarct. Additional remote infarcts seen within the high and posterior right frontal lobe. Moderate wallerian degeneration noted within the right middle cerebellar peduncle.  No abnormal foci of restricted diffusion to suggest acute intracranial infarct identified. Gray-white matter differentiation maintained. The right vertebral artery is likely occluded. Otherwise, normal intravascular flow voids present. No acute intracranial hemorrhage.  No mass lesion, midline shift, or mass effect. Ventricles within normal limits without evidence of hydrocephalus. No extra-axial fluid collection.  Craniocervical junction within normal limits. Incidental note made of a partially empty sella. No acute abnormality seen about the orbits.  Paranasal sinuses and mastoid air cells are clear.  IMPRESSION: 1. No acute  intracranial infarct or other process identified. 2. Remote bilateral cerebellar infarcts, right greater than left, with associated chronic blood products/hemorrhage in the inferior right cerebellar hemisphere. 3. Remote cortical infarct within the high posterior right frontal lobe. 4. Moderate wallerian degeneration within the right middle cerebellar peduncle. 5. Atrophy with very mild chronic small vessel ischemic changes.   Electronically Signed   By: Jeannine Boga M.D.   On: 02/09/2014 03:14   Mr Thoracic Spine Wo Contrast  02/09/2014  CLINICAL DATA:  80 year old male with back pain. Recent falls. Initial encounter. History of chronic thoracic compression fractures. History of right vertebral artery occlusion and cerebellar infarcts.  History of prostate cancer metastatic to bone.  History of melanoma.  EXAM: MRI THORACIC AND LUMBAR SPINE WITHOUT CONTRAST  TECHNIQUE: Multiplanar and multiecho pulse sequences of the thoracic and lumbar spine were obtained without intravenous contrast.  COMPARISON:  Cervical and thoracic MRI 09/28/2012. CT Abdomen and Pelvis 04/27/2009.  FINDINGS: MR THORACIC SPINE FINDINGS  Limited sagittal imaging of the cervical spine is grossly stable. Chronic T6 and T11 compression fractures, the latter previously augmented. Stable thoracic vertebral height and alignment.  Resolved abnormal T1 hypo intensity in the T9 vertebral body since 2014. No marrow edema or evidence of acute osseous abnormality. No thoracic spine metastasis identified.  No thoracic spinal stenosis. Stable thoracic spinal cord with no abnormal cord signal.  Negative visualized posterior paraspinal soft tissues.  Stable visualized thoracic and upper abdominal viscera.  MR LUMBAR SPINE FINDINGS  Normal lumbar segmentation. Stable lumbar vertebral height and alignment since 2011. Chronic L2 superior endplate Schmorl node.  There is confluent abnormal marrow signal in the L4 vertebral body with early involvement  of the anterior pedicle. The right vertebral body is affected encompassing an area of 34 x 23 by 30 mm (AP by transverse by CC). Associated T2 and FLAIR hyperintensity. No loss of L4 vertebral body height. No epidural or extraosseous extension.  No similar lesion identified elsewhere in the lumbar spine or sacrum, but there is 80 31 mm diameter similar bone lesion in the medial right iliac bone on series 16, image 33.  Visualized lower thoracic spinal cord is normal with conus medularis at L1. Normal non contrast cauda equina nerve roots. No lumbar spinal stenosis.  Chronic infrarenal abdominal aortic aneurysm now measures up to 47 mm maximal diameter (40 mm in 2011).  Chronic left renal atrophy. Other visualized abdominal viscera are stable. The bladder is distended, partially visualized.  Posterior paraspinal muscle atrophy.  IMPRESSION: MR THORACIC SPINE IMPRESSION  1. No acute findings in the thoracic spine. No thoracic spinal stenosis. 2. Regressed T9 bone metastasis since 2014.  MR LUMBAR SPINE IMPRESSION  1. Bone metastases in the L4 vertebral body and medial right iliac bone measuring about 3 cm each. No pathologic fracture or extraosseous tumor identified. 2. Progressed chronic infrarenal abdominal aortic aneurysm since 2011, now 47 mm in diameter. Recommend followup by abdomen and pelvis CTA in 6 months, and vascular surgery referral/consultation if not already obtained. This recommendation follows ACR consensus guidelines: White Paper of the ACR Incidental Findings Committee II on Vascular Findings. J Am Coll Radiol 2013; 10:789-794. 3. No lumbar spinal stenosis.   Electronically Signed   By: Lars Pinks M.D.   On: 02/09/2014 11:53   Mr Lumbar Spine Wo Contrast  02/09/2014   CLINICAL DATA:  80 year old male with back pain. Recent falls. Initial encounter. History of chronic thoracic compression fractures. History of right vertebral artery occlusion and cerebellar infarcts.  History of prostate cancer  metastatic to bone.  History of melanoma.  EXAM: MRI THORACIC AND LUMBAR SPINE WITHOUT CONTRAST  TECHNIQUE: Multiplanar and multiecho pulse sequences of the thoracic and lumbar spine were obtained without intravenous contrast.  COMPARISON:  Cervical and thoracic MRI 09/28/2012. CT Abdomen and Pelvis 04/27/2009.  FINDINGS: MR THORACIC SPINE FINDINGS  Limited sagittal imaging of the cervical spine is grossly stable. Chronic T6 and T11 compression fractures, the latter previously augmented. Stable thoracic  vertebral height and alignment.  Resolved abnormal T1 hypo intensity in the T9 vertebral body since 2014. No marrow edema or evidence of acute osseous abnormality. No thoracic spine metastasis identified.  No thoracic spinal stenosis. Stable thoracic spinal cord with no abnormal cord signal.  Negative visualized posterior paraspinal soft tissues.  Stable visualized thoracic and upper abdominal viscera.  MR LUMBAR SPINE FINDINGS  Normal lumbar segmentation. Stable lumbar vertebral height and alignment since 2011. Chronic L2 superior endplate Schmorl node.  There is confluent abnormal marrow signal in the L4 vertebral body with early involvement of the anterior pedicle. The right vertebral body is affected encompassing an area of 34 x 23 by 30 mm (AP by transverse by CC). Associated T2 and FLAIR hyperintensity. No loss of L4 vertebral body height. No epidural or extraosseous extension.  No similar lesion identified elsewhere in the lumbar spine or sacrum, but there is 80 31 mm diameter similar bone lesion in the medial right iliac bone on series 16, image 33.  Visualized lower thoracic spinal cord is normal with conus medularis at L1. Normal non contrast cauda equina nerve roots. No lumbar spinal stenosis.  Chronic infrarenal abdominal aortic aneurysm now measures up to 47 mm maximal diameter (40 mm in 2011).  Chronic left renal atrophy. Other visualized abdominal viscera are stable. The bladder is distended, partially  visualized.  Posterior paraspinal muscle atrophy.  IMPRESSION: MR THORACIC SPINE IMPRESSION  1. No acute findings in the thoracic spine. No thoracic spinal stenosis. 2. Regressed T9 bone metastasis since 2014.  MR LUMBAR SPINE IMPRESSION  1. Bone metastases in the L4 vertebral body and medial right iliac bone measuring about 3 cm each. No pathologic fracture or extraosseous tumor identified. 2. Progressed chronic infrarenal abdominal aortic aneurysm since 2011, now 47 mm in diameter. Recommend followup by abdomen and pelvis CTA in 6 months, and vascular surgery referral/consultation if not already obtained. This recommendation follows ACR consensus guidelines: White Paper of the ACR Incidental Findings Committee II on Vascular Findings. J Am Coll Radiol 2013; 10:789-794. 3. No lumbar spinal stenosis.   Electronically Signed   By: Lars Pinks M.D.   On: 02/09/2014 11:53   Ct Hip Left Wo Contrast  02/09/2014   CLINICAL DATA:  Acute onset of left-sided weakness and left hip pain. Four recent falls. Difficulty ambulating. Initial encounter.  EXAM: CT OF THE LEFT HIP WITHOUT CONTRAST  TECHNIQUE: Multidetector CT imaging of the left hip was performed according to the standard protocol. Multiplanar CT image reconstructions were also generated.  COMPARISON:  CT of the abdomen and pelvis performed 04/27/2009  FINDINGS: There is no evidence of fracture or dislocation. Prominent bone islands are again noted at the left femoral head and neck. The left femoral head remains seated at the acetabulum. The left sacroiliac joint is unremarkable in appearance.  Mild focal bony expansion at the left inferior pubic ramus appears to reflect chronic sequelae of a remote lytic lesion at this location. Mild degenerative change is noted at the lower lumbar spine.  There is focal dilatation of the distal abdominal aorta to 3.4 cm in AP dimension and 4.7 cm in transverse dimension. Diffuse calcification is seen along the distal abdominal  aorta and its branches. The prostate appears mildly enlarged. No additional soft tissue abnormalities are seen. Visualized small and large bowel loops are grossly unremarkable.  IMPRESSION: 1. No evidence of fracture or dislocation. 2. Mild focal bony expansion at the left inferior pubic ramus reflects chronic sequelae of a  remote lytic lesion at this location; there has been interval peripheral ossification, without evidence of a recurrent mass. 3. Focal dilatation of the distal abdominal aorta to 3.4 cm in AP dimension and 4.7 cm in transverse dimension, increased in size from 2011. 4. Diffuse calcification along the distal abdominal aorta and its branches. 5. Mildly enlarged prostate.   Electronically Signed   By: Garald Balding M.D.   On: 02/09/2014 04:08       IMPRESSION:  The patient has a history of metastatic prostate cancer. He is having some back pain with radiation to the left hip/left lower extremity. He has a metastasis in the L4 vertebral body as well as the right iliac bone. These lesions are appropriate in my opinion for radiosurgery which would consist of 1 treatment to the L4 vertebral body and 3 treatments to the right iliac metastasis. This plan was reviewed in multidisciplinary brain/spine conference.  PLAN: The patient will proceed with CT simulation as soon as possible, likely tomorrow on 02/12/2014. Hopefully we will be able to begin his treatment the following day and finish this treatment within approximately 1 week. This can be treated as an outpatient when the patient is suitable for discharge.     ________________________________   Jodelle Gross, MD, PhD   **Disclaimer: This note was dictated with voice recognition software. Similar sounding words can inadvertently be transcribed and this note may contain transcription errors which may not have been corrected upon publication of note.**

## 2014-02-12 NOTE — Progress Notes (Signed)
Transfer patient to [facility]. Report given to receiving RN.

## 2014-02-12 NOTE — Progress Notes (Signed)
Physical Therapy Treatment Patient Details Name: Craig [name]. MRN: [id number] DOB: 23-Aug-1924 Today's Date: 02/12/2014    History of Present Illness Patient is an 80 yo male who presents with left side weakness; unclear if related to pain, patient with frequent fall    PT Comments    Patient with steady improvements and function mobility this session. Performed ambulation with spouse assist and cues for safety with mobility. Extensive education for home management performed. Recommend HHPT upon discharge. Spoke with patient and spouse regarding concerns for mobility.  Discussed potential for increased fatigue with radiation and possible use of w/c vs RW upon discharge if patient unsafe during mobility.  Spoke with spouse regarding assistance level and expectations of HHPT.  Wife states that she is comfortable providing level of assist currently required.  Educated patient on safety with mobility and things to be on the look out for to indicated unsafe for ambulation. Patient and spouse very receptive.   Follow Up Recommendations  Home health PT;Supervision/Assistance - 24 hour;Supervision for mobility/OOB     Equipment Recommendations       Recommendations for Other Services       Precautions / Restrictions Precautions Precautions: Fall Restrictions Weight Bearing Restrictions: No    Mobility  Bed Mobility Overal bed mobility: Needs Assistance Bed Mobility: Supine to Sit;Sit to Supine     Supine to sit: Min assist Sit to supine: Min guard   General bed mobility comments: Less assist required today  Transfers Overall transfer level: Needs assistance Equipment used: Rolling walker (2 wheeled);2 person hand held assist Transfers: Sit to/from Stand Sit to Stand: Min assist         General transfer comment: VCs for hand placement and safety with mobility  Ambulation/Gait Ambulation/Gait assistance: Min guard;Min assist Ambulation Distance (Feet): 140  Feet Assistive device: Rolling walker (2 wheeled) Gait Pattern/deviations: Step-through pattern;Decreased stride length;Decreased step length - left;Decreased dorsiflexion - left;Decreased weight shift to left;Shuffle;Trunk flexed;Narrow base of support;Drifts right/left Gait velocity: decreased Gait velocity interpretation: Below normal speed for age/gender General Gait Details: patient assisted with ambulation from wife, ambulating well with no LOB.  Wife comfortable with providing this level of assist.   Stairs            Wheelchair Mobility    Modified Rankin (Stroke Patients Only)       Balance     Sitting balance-Leahy Scale: Fair     Standing balance support: During functional activity Standing balance-Leahy Scale: Fair Standing balance comment: reliance on RW for stability in standing                    Cognition Arousal/Alertness: Awake/alert Behavior During Therapy: WFL for tasks assessed/performed Overall Cognitive Status: Impaired/Different from baseline Area of Impairment: Problem solving             Problem Solving: Slow processing;Difficulty sequencing;Requires verbal cues;Requires tactile cues General Comments: continues to demonstrates some delayed processing at times    Exercises      General Comments General comments (skin integrity, edema, etc.): VS stable.  Spoke with patient and spouse regarding concerns for mobility.  Discussed potential for increased fatigue with radiation of possible use of w/c vs RW upon discharge if patient unsafe during mobility.  Spoke with spouse regarding assistance level and expectations of HHPT.  Wife states that she is comfortable providing level of assist currently required.  Educated patient on safety with mobility and things to be on the look out for to  indicated unsafe for ambulation. Patient and spouse very receptive.      Pertinent Vitals/Pain      Home Living                      Prior  Function            PT Goals (current goals can now be found in the care plan section) Acute Rehab PT Goals Patient Stated Goal: to be home for holiday PT Goal Formulation: With patient/family Time For Goal Achievement: 02/25/14 Potential to Achieve Goals: Good Progress towards PT goals: Progressing toward goals    Frequency  Min 3X/week    PT Plan Current plan remains appropriate    Co-evaluation             End of Session Equipment Utilized During Treatment: Gait belt Activity Tolerance: Patient tolerated treatment well Patient left: in bed;with call bell/phone within reach;with bed alarm set;with family/visitor present     Time: 0912-0937 PT Time Calculation (min) (ACUTE ONLY): 25 min  Charges:  $Gait Training: 8-22 mins $Self Care/Home Management: 8-22                    G CodesDuncan Dull March 02, 2014, 1:36 PM Alben Deeds, [place] DPT  [phone]

## 2014-02-12 NOTE — Progress Notes (Signed)
Wife has concerns about Home Health. Spoke with pt and wife at bedside. They were not sure if CM needed to talk with them any more about HH.  During a Follow up on [facility]; Referrals was already called in by CM at [facility] to [place]. There are no other needs at present time.

## 2014-02-12 NOTE — Progress Notes (Signed)
ANTICOAGULATION CONSULT NOTE - Follow Up Consult  Pharmacy Consult for Coumadin Indication: atrial fibrillation and CVA  Allergies  Allergen Reactions  . Pravachol     Muscle weakness  . Zocor [Simvastatin - High Dose]     Muscle weakness    Patient Measurements: Height: 6' (182.9 cm) Weight: 186 lb 14.4 oz (84.777 kg) (scale C) IBW/kg (Calculated) : 77.6 Heparin Dosing Weight:   Vital Signs: Temp: 97.7 F (36.5 C) (12/22 0513) Temp Source: Oral (12/22 0513) BP: 169/87 mmHg (12/22 0513) Pulse Rate: 62 (12/22 0513)  Labs:  Recent Labs  02/10/14 0332 02/11/14 0706 02/11/14 0840 02/12/14 0348  HGB 12.4*  --  13.3  --   HCT 37.9*  --  40.0  --   PLT 204  --  219  --   LABPROT 24.0* 23.0*  --  24.1*  INR 2.13* 2.02*  --  2.14*  CREATININE 1.61*  --  1.43* 1.60*    Estimated Creatinine Clearance: 35.7 mL/min (by C-G formula based on Cr of 1.6).  Assessment: Weakness, fatigue, increased falls (bony mets)  80 yo who was admitted for left sided weakness. He also has a hx of afib on chronic coumadin. Cards goal of INR is lower for his case (1.8-2.2).  Anticoagulation: Warf-Rx: hx Afib, CVA; INR 2.14 (goal 1.8-2.2 per CC). INR 2.02 today. Hgb improved 13.3. Plts 219 ok. Hematoma R forearm noted from fall 12/8. - PTA coumadin = 2.5mg  qday except 5mg  MWF  Infectious Disease: Afeb, WBC 13>>11.2- no abx, f/u for infectious signs  Cardiovascular: Afib, HLD, CHF, HTN; BP elevated, HR 62-105, CE (-) x 3 - Norvasc5, ASA81, Lipitor10, Zetia, Lovaza, K+   Endocrinology - BG 156; Dexamethasone started for spinal mets  Gastrointestinal / Nutrition: GERD; LFTs wnl  Neurology: Hx ICH, CVA - weakness more due to pain in back;  MRI brain - no acute infarct - MRI thoracic - few vertebral metastases, progressed AAA - Flexeril trial --> sedated pt too much, now just prn  Nephrology: Stage III CKD, Scr 1.6 up  Hematology / Oncology: Hx prostate CA - chemo on hold, MRI showed some  spinal metastases --> Radiation Onc consult + Dexa started. Transfer to [facility] for radiation.  PTA Medication Issues: tylenol 500 BID, Ziac, Xgeva, Docusate, Xtandi, Lasix, Lupron, Hyzaar, MV, Vesicare  Best Practices : Warfarin  Goal of Therapy:  INR 1.8-2.2 Monitor platelets by anticoagulation protocol: Yes   Plan:  1. Continue Coumadin 2.5mg  qday except 5mg  MWF 2. Daily INR   Craig [name], PharmD, BCPS Clinical Staff Pharmacist Pager [phone]  Craig [name] 02/12/2014,8:33 AM

## 2014-02-12 NOTE — Consult Note (Signed)
Reason for Consult:metastatic prostate cancer to L 4 Referring Physician: [name]. is an 80 y.o. male.  HPI: Craig [name]. is a 80 y.o. male who is seen for an initial consultation visit regarding the patient's diagnosis of metastatic prostate cancer. The patient presented with progressive weakness. By report, he has had a prior history of stroke. The patient has fallen several times in recent weeks and was seen by his orthopedic physician for workup of a left knee and hip injury which were negative for significant acute injury. The patient has had some lower back pain radiating to the left hip and thigh. All month, patient's workup has included a brain MRI scan which was negative for acute process. The patient also underwent an MRI scan of the thoracic and lumbar spine. No acute findings in the thoracic spine. Bone metastasis was seen in the L4 vertebral body as well as the medial right iliac bone. Both of these measured 3 cm. The patient's case was reviewed in multidisciplinary brain/fine conference. He was felt to be a good candidate to proceed with radiation treatment to these areas. Both of these areas can be treated with a short course of radiosurgery.   Past Medical History  Diagnosis Date  . Hyperlipidemia   . Hypokalemia   . Hypertensive heart disease   . Chronic back pain   . History of epistaxis 07/19/2002  . Personal history of long-term (current) use of anticoagulants   . ICH (intracerebral hemorrhage) ~ 1999    after TPA/notes 09/27/2012  . Hypertension   . Atrial fibrillation   . PAT (paroxysmal atrial tachycardia)   . Pneumonia     "once; several years ago" (09/27/2012)  . GERD (gastroesophageal reflux disease)   . H/O hiatal hernia   . Migraines     "migraines without headaches years ago" (09/27/2012)  . Stroke ~ 1999    ischemic / right cerebellar/posterior inferior cerebellar artery / right pos infarct  . Melanoma     "top of my head" (09/27/2012)   . Prostate cancer, primary, with metastasis from prostate to other site     on Lupron injections per GU  . Osteoarthritis of both feet     Past Surgical History  Procedure Laterality Date  . Nasal sinus surgery  1998  . Cataract extraction w/ intraocular lens  implant, bilateral Bilateral ~ 2011  . Skin graft Right 2010  . Melanoma excision  2010    "pre-melanoma on top of head; did skin graft from left thigh to cover" (09/27/2012)  . Tonsillectomy  ~ 1935    Family History  Problem Relation Age of Onset  . Heart failure Mother   . Heart attack Father     Social History:  reports that he quit smoking about 61 years ago. His smoking use included Cigarettes. He smoked 0.00 packs per day for .5 years. He has never used smokeless tobacco. He reports that he drinks alcohol. He reports that he does not use illicit drugs.  Allergies:  Allergies  Allergen Reactions  . Pravachol     Muscle weakness  . Zocor [Simvastatin - High Dose]     Muscle weakness    Medications: I have reviewed the patient's current medications.  Results for orders placed or performed during the hospital encounter of 02/08/14 (from the past 48 hour(s))  Protime-INR     Status: Abnormal   Collection Time: 02/11/14  7:06 AM  Result Value Ref Range   Prothrombin  Time 23.0 (H) 11.6 - 15.2 seconds   INR 2.02 (H) 0.00 - 6.25  Basic metabolic panel     Status: Abnormal   Collection Time: 02/11/14  8:40 AM  Result Value Ref Range   Sodium 140 137 - 147 mEq/L   Potassium 4.5 3.7 - 5.3 mEq/L   Chloride 98 96 - 112 mEq/L   CO2 28 19 - 32 mEq/L   Glucose, Bld 152 (H) 70 - 99 mg/dL   BUN 70 (H) 6 - 23 mg/dL   Creatinine, Ser 1.43 (H) 0.50 - 1.35 mg/dL   Calcium 9.7 8.4 - 10.5 mg/dL   GFR calc non Af Amer 42 (L) >90 mL/min   GFR calc Af Amer 49 (L) >90 mL/min    Comment: (NOTE) The eGFR has been calculated using the CKD EPI equation. This calculation has not been validated in all clinical situations. eGFR's  persistently <90 mL/min signify possible Chronic Kidney Disease.    Anion gap 14 5 - 15  CBC     Status: Abnormal   Collection Time: 02/11/14  8:40 AM  Result Value Ref Range   WBC 11.3 (H) 4.0 - 10.5 K/uL   RBC 4.13 (L) 4.22 - 5.81 MIL/uL   Hemoglobin 13.3 13.0 - 17.0 g/dL   HCT 40.0 39.0 - 52.0 %   MCV 96.9 78.0 - 100.0 fL   MCH 32.2 26.0 - 34.0 pg   MCHC 33.3 30.0 - 36.0 g/dL   RDW 13.2 11.5 - 15.5 %   Platelets 219 150 - 400 K/uL  Protime-INR     Status: Abnormal   Collection Time: 02/12/14  3:48 AM  Result Value Ref Range   Prothrombin Time 24.1 (H) 11.6 - 15.2 seconds   INR 2.14 (H) 0.00 - 6.38  Basic metabolic panel     Status: Abnormal   Collection Time: 02/12/14  3:48 AM  Result Value Ref Range   Sodium 135 135 - 145 mmol/L    Comment: Please note change in reference range.   Potassium 4.3 3.5 - 5.1 mmol/L    Comment: Please note change in reference range.   Chloride 100 96 - 112 mEq/L   CO2 25 19 - 32 mmol/L   Glucose, Bld 156 (H) 70 - 99 mg/dL   BUN 76 (H) 6 - 23 mg/dL   Creatinine, Ser 1.60 (H) 0.50 - 1.35 mg/dL   Calcium 8.7 8.4 - 10.5 mg/dL   GFR calc non Af Amer 37 (L) >90 mL/min   GFR calc Af Amer 43 (L) >90 mL/min    Comment: (NOTE) The eGFR has been calculated using the CKD EPI equation. This calculation has not been validated in all clinical situations. eGFR's persistently <90 mL/min signify possible Chronic Kidney Disease.    Anion gap 10 5 - 15    No results found.  Review of Systems - Negative except As above.  Knee arthritis and pain improved with injections    Blood pressure 176/72, pulse 60, temperature 97.5 F (36.4 C), temperature source Oral, resp. rate 18, height 6' (1.829 m), weight 85.231 kg (187 lb 14.4 oz), SpO2 100 %. Physical Exam Patient is awake, alert and conversant.  He has minimal complaints of pain.  He complains of leg pain in his left knee and generalized weakness.  He appears to demonstrate good power in both lower  extremities.  He is currently resting in bed.  He denies significant back pain. Assessment/Plan: Metastatic prostate cancer to lumbar spine.  Plan  stereotactic radiosurgery to L 4 vertebral lesion. I met with patient and answered his questions and reviewed treatment plan.  Peggyann Shoals, MD 02/12/2014, 7:51 PM

## 2014-02-12 NOTE — Progress Notes (Signed)
  Radiation Oncology         [phone]) [phone] ________________________________  Name: Craig [name]. MRN: [id number]  Date: 02/12/2014  DOB: 09-02-1924  SIMULATION AND TREATMENT PLANNING NOTE  DIAGNOSIS:  Metastatic prostate cancer  NARRATIVE:  The patient was brought to the [place].  Identity was confirmed.  All relevant records and images related to the planned course of therapy were reviewed.  The patient freely provided informed written consent to proceed with treatment after reviewing the details related to the planned course of therapy. The consent form was witnessed and verified by the simulation staff. Intravenous access was established for contrast administration. Then, the patient was set-up in a stable reproducible supine position for radiation therapy.  A customized vac lock bag was constructed for patient immobilization on a daily basis.  CT images were obtained.  Surface markings were placed.  The CT images were loaded into the planning software and fused with the patient's targeting MRI scan.  Then the target and avoidance structures were contoured.  Treatment planning then occurred.  The radiation prescription was entered and confirmed.  I have requested 3D planning  I have requested a DVH of the following structures: Planning target volume, bowel, thecal sac.    PLAN:  The patient will receive 18 Gy in 1 fraction to the L4 spine metastasis and 40 gray in 5 fractions to the right iliac metastasis..  ________________________________  Jodelle Gross, MD, PhD

## 2014-02-12 NOTE — Telephone Encounter (Signed)
Called [facility] and spoke with [name] gave information to have patient from [facility] 14-C to have here by 10000am for [facility] simulation L-Spine, Left sided weaknes, he should also get a cal From Lauren,RN from that unit [name] took all information and said patient would be here at 1000Am,thanked Thomas 7:44 AM  7:43 AM

## 2014-02-12 NOTE — Telephone Encounter (Signed)
Called [facility] 3E-14_c, spoke first with [facility] who transferred me to [place], informed her that [place] or nurse in [place] of the patient, needs to arrange transportation from [place] to have patient here by 1000am for [place] L-Spine,which will be approximately 90 minutes, she will call [facility],thanked RN, patient nurse in report with late night shift RN no orders for d/c as yet 7:34 AM

## 2014-02-13 ENCOUNTER — Ambulatory Visit
Admit: 2014-02-13 | Discharge: 2014-02-13 | Disposition: A | Discharge status: Home / Self Care | Payer: Medicare Other | Attending: Radiation Oncology | Admitting: Radiation Oncology

## 2014-02-13 ENCOUNTER — Encounter: Payer: Self-pay | Admitting: Radiation Oncology

## 2014-02-13 DIAGNOSIS — I10 Essential (primary) hypertension: Secondary | ICD-10-CM

## 2014-02-13 DIAGNOSIS — Z510 Encounter for antineoplastic radiation therapy: Secondary | ICD-10-CM | POA: Diagnosis not present

## 2014-02-13 LAB — BASIC METABOLIC PANEL
Anion gap: 8 (ref 5–15)
BUN: 79 mg/dL — AB (ref 6–23)
CALCIUM: 8.8 mg/dL (ref 8.4–10.5)
CHLORIDE: 101 meq/L (ref 96–112)
CO2: 26 mmol/L (ref 19–32)
CREATININE: 1.4 mg/dL — AB (ref 0.50–1.35)
GFR calc Af Amer: 51 mL/min — ABNORMAL LOW (ref >90)
GFR calc non Af Amer: 44 mL/min — ABNORMAL LOW (ref >90)
Glucose, Bld: 160 mg/dL — ABNORMAL HIGH (ref 70–99)
Potassium: 4.6 mmol/L (ref 3.5–5.1)
Sodium: 135 mmol/L (ref 135–145)

## 2014-02-13 LAB — PROTIME-INR
INR: 2.4 — ABNORMAL HIGH (ref 0.00–1.49)
Prothrombin Time: 26.3 seconds — ABNORMAL HIGH (ref 11.6–15.2)

## 2014-02-13 MED ORDER — WARFARIN SODIUM 2.5 MG PO TABS
2.5000 mg | ORAL_TABLET | Freq: Once | ORAL | Status: DC
  Filled 2014-02-13: qty 1

## 2014-02-13 MED ORDER — LOSARTAN POTASSIUM 100 MG PO TABS: 100.0000 mg | ORAL_TABLET | Freq: Every day | ORAL | Status: DC

## 2014-02-13 MED ORDER — LOSARTAN POTASSIUM 50 MG PO TABS
50.0000 mg | ORAL_TABLET | Freq: Every day | ORAL | Status: DC
  Administered 2014-02-13: 50 mg via ORAL
  Filled 2014-02-13: qty 1

## 2014-02-13 NOTE — Consult Note (Signed)
    SUBJECTIVE:  Breathing OK.  No distress   PHYSICAL EXAM Filed Vitals:   02/12/14 1137 02/12/14 1300 02/12/14 2122 02/13/14 0514  BP: 152/65 176/72 200/71 192/85  Pulse: 61 60 66 75  Temp: 97.5 F (36.4 C) 97.5 F (36.4 C) 98.2 F (36.8 C) 97.6 F (36.4 C)  TempSrc: Oral Oral Oral Oral  Resp: 18 18 18 18   Height: 6' (1.829 m)     Weight: 187 lb 14.4 oz (85.231 kg)     SpO2: 100% 100% 97% 99%   General:  No distress Lungs:  Clear Heart:  Irregular Abdomen:  Positive bowel sounds, no rebound no guarding Extremities:  No edema   LABS:  Results for orders placed or performed during the hospital encounter of 02/08/14 (from the past 24 hour(s))  Protime-INR     Status: Abnormal   Collection Time: 02/13/14  3:50 AM  Result Value Ref Range   Prothrombin Time 26.3 (H) 11.6 - 15.2 seconds   INR 2.40 (H) 0.00 - 6.65  Basic metabolic panel     Status: Abnormal   Collection Time: 02/13/14  3:50 AM  Result Value Ref Range   Sodium 135 135 - 145 mmol/L   Potassium 4.6 3.5 - 5.1 mmol/L   Chloride 101 96 - 112 mEq/L   CO2 26 19 - 32 mmol/L   Glucose, Bld 160 (H) 70 - 99 mg/dL   BUN 79 (H) 6 - 23 mg/dL   Creatinine, Ser 1.40 (H) 0.50 - 1.35 mg/dL   Calcium 8.8 8.4 - 10.5 mg/dL   GFR calc non Af Amer 44 (L) >90 mL/min   GFR calc Af Amer 51 (L) >90 mL/min   Anion gap 8 5 - 15    Intake/Output Summary (Last 24 hours) at 02/13/14 0704 Last data filed at 02/13/14 0601  Gross per 24 hour  Intake    120 ml  Output    625 ml  Net   -505 ml     ASSESSMENT AND PLAN:  ATRIAL FIB:  Rate OK.  OK to discontinue telemetry.  Holding beta blocker and ARB at this point.  BP is increased.    ACUTE ON CHRONIC DIASTOLIC HF:   Euvolemic.    Currently not on Lasix.  OK to hold again today.  He will likely need some PO Lasix at discharge.   I don't see the results of the echo.  Discussed with nursing.  I will restart a low dose of ARB as his BP is elevated.     Jeneen Rinks  Medical [facility] 02/13/2014 7:04 AM

## 2014-02-13 NOTE — Discharge Summary (Addendum)
Physician Discharge Summary  Craig [name]. MRN: [id number] DOB/AGE: Feb 04, 1925 80 y.o.  PCP: [name], MD   Admit date: 02/08/2014 Discharge date: 02/13/2014  Discharge Diagnoses:      Weakness Active Problems:   Chronic atrial fibrillation   Hypercholesterolemia   Prostate cancer metastatic to bone   Chronic kidney disease (CKD), stage III (moderate)   Fluid overload   Left-sided weakness   Back pain   Chronic diastolic heart failure   Hypertensive heart disease   Essential hypertension   History of stroke   AAA (abdominal aortic aneurysm)   Hypokalemia   Bone metastasis  Follow-up recommendations Follow-up with PCP in 5-7 days Resume Lasix in one to 2 days Follow-up CBC and BMP in one week    Medication List    STOP taking these medications        aspirin 81 MG chewable tablet     bisoprolol-hydrochlorothiazide 10-6.25 MG per tablet  Commonly known as:  ZIAC     furosemide 20 MG tablet  Commonly known as:  LASIX     losartan-hydrochlorothiazide 100-25 MG per tablet  Commonly known as:  HYZAAR     potassium chloride SA 20 MEQ tablet  Commonly known as:  K-DUR,KLOR-CON      TAKE these medications        acetaminophen 500 MG tablet  Commonly known as:  TYLENOL  Take 500 mg by mouth 2 (two) times daily. Takes one in the AM and one qhs     amLODipine 5 MG tablet  Commonly known as:  NORVASC  Take 1 tablet (5 mg total) by mouth daily.     atorvastatin 10 MG tablet  Commonly known as:  LIPITOR  Take 1 tablet (10 mg total) by mouth daily.     B-complex with vitamin C tablet  Take 1 tablet by mouth daily.     cyclobenzaprine 5 MG tablet  Commonly known as:  FLEXERIL  Take 1 tablet (5 mg total) by mouth daily as needed for muscle spasms.     dexamethasone 4 MG tablet  Commonly known as:  DECADRON  Take 1 tablet (4 mg total) by mouth 3 (three) times daily.     enzalutamide 40 MG capsule  Commonly known as:  XTANDI  Take 2  capsules (80 mg total) by mouth daily.     FISH OIL PO  Take 1 capsule by mouth daily. ( Mega Red )     losartan 100 MG tablet  Commonly known as:  COZAAR  Take 1 tablet (100 mg total) by mouth daily.     LUPRON DEPOT IM  Inject 1 each into the muscle every 6 (six) months.     multivitamin per tablet  Take 1 tablet by mouth daily. ( with B - Complex )     oxyCODONE-acetaminophen 5-325 MG per tablet  Commonly known as:  PERCOCET/ROXICET  Take 1 tablet by mouth every 6 (six) hours as needed for severe pain.     pantoprazole 40 MG tablet  Commonly known as:  PROTONIX  Take 1 tablet (40 mg total) by mouth daily. While on steroids     solifenacin 5 MG tablet  Commonly known as:  VESICARE  Take 5 mg by mouth daily.     STOOL SOFTENER PO  Take 1 capsule by mouth 2 (two) times daily.     warfarin 5 MG tablet  Commonly known as:  COUMADIN  Take 1 tablet (5 mg total)  by mouth as directed.     XGEVA Hernando  Inject into the skin every 30 (thirty) days. monthly     ZETIA 10 MG tablet  Generic drug:  ezetimibe  TAKE 1 TABLET BY MOUTH ONCE DAILY        Discharge Condition:   Disposition: 01-Home or Self Care   Consults:    Significant Diagnostic Studies: Dg Chest 2 View  02/09/2014   CLINICAL DATA:  Elevated B-type natriuretic peptide level. Initial encounter.  EXAM: CHEST  2 VIEW  COMPARISON:  Chest radiograph from 09/27/2012  FINDINGS: The lungs are well-aerated. Minimal left basilar atelectasis is noted. There is no evidence of pleural effusion or pneumothorax.  The heart is enlarged. No acute osseous abnormalities are seen. The patient is status post vertebroplasty at the lower thoracic spine.  IMPRESSION: Minimal left basilar atelectasis noted; lungs otherwise clear. Cardiomegaly.   Electronically Signed   By: Garald Balding M.D.   On: 02/09/2014 03:52   Ct Head Wo Contrast  02/08/2014   CLINICAL DATA:  Left-sided weakness. , fall. Slurred speech. Prior stroke.  EXAM: CT  HEAD WITHOUT CONTRAST  TECHNIQUE: Contiguous axial images were obtained from the base of the skull through the vertex without intravenous contrast.  COMPARISON:  02/06/2013  FINDINGS: Old right cerebellar infarct, stable. Diffuse age related volume loss/atrophy. No acute intracranial abnormality. Specifically, no hemorrhage, hydrocephalus, mass lesion, acute infarction, or significant intracranial injury. No acute calvarial abnormality. Visualized paranasal sinuses and mastoids clear. Orbital soft tissues unremarkable.  IMPRESSION: No acute intracranial abnormality.   Electronically Signed   By: Rolm Baptise M.D.   On: 02/08/2014 14:28   Mr Brain Wo Contrast  02/09/2014   CLINICAL DATA:  Initial evaluation for left-sided weakness.  EXAM: MRI HEAD WITHOUT CONTRAST  TECHNIQUE: Multiplanar, multiecho pulse sequences of the brain and surrounding structures were obtained without intravenous contrast.  COMPARISON:  Prior CT from 02/08/2014.  FINDINGS: Diffuse prominence of the CSF containing spaces is compatible with age-related generalized cerebral atrophy. Mild patchy T2/FLAIR hyperintensity seen within the periventricular and deep white matter both cerebral hemispheres, nonspecific, but likely related to mild chronic small vessel ischemic changes.  Remote bilateral cerebellar infarcts present, right larger than left. There are associated chronic blood products with the right cerebellar infarct. Additional remote infarcts seen within the high and posterior right frontal lobe. Moderate wallerian degeneration noted within the right middle cerebellar peduncle.  No abnormal foci of restricted diffusion to suggest acute intracranial infarct identified. Gray-white matter differentiation maintained. The right vertebral artery is likely occluded. Otherwise, normal intravascular flow voids present. No acute intracranial hemorrhage.  No mass lesion, midline shift, or mass effect. Ventricles within normal limits without  evidence of hydrocephalus. No extra-axial fluid collection.  Craniocervical junction within normal limits. Incidental note made of a partially empty sella. No acute abnormality seen about the orbits.  Paranasal sinuses and mastoid air cells are clear.  IMPRESSION: 1. No acute intracranial infarct or other process identified. 2. Remote bilateral cerebellar infarcts, right greater than left, with associated chronic blood products/hemorrhage in the inferior right cerebellar hemisphere. 3. Remote cortical infarct within the high posterior right frontal lobe. 4. Moderate wallerian degeneration within the right middle cerebellar peduncle. 5. Atrophy with very mild chronic small vessel ischemic changes.   Electronically Signed   By: Jeannine Boga M.D.   On: 02/09/2014 03:14   Mr Thoracic Spine Wo Contrast  02/09/2014   CLINICAL DATA:  80 year old male with back pain. Recent falls.  Initial encounter. History of chronic thoracic compression fractures. History of right vertebral artery occlusion and cerebellar infarcts.  History of prostate cancer metastatic to bone.  History of melanoma.  EXAM: MRI THORACIC AND LUMBAR SPINE WITHOUT CONTRAST  TECHNIQUE: Multiplanar and multiecho pulse sequences of the thoracic and lumbar spine were obtained without intravenous contrast.  COMPARISON:  Cervical and thoracic MRI 09/28/2012. CT Abdomen and Pelvis 04/27/2009.  FINDINGS: MR THORACIC SPINE FINDINGS  Limited sagittal imaging of the cervical spine is grossly stable. Chronic T6 and T11 compression fractures, the latter previously augmented. Stable thoracic vertebral height and alignment.  Resolved abnormal T1 hypo intensity in the T9 vertebral body since 2014. No marrow edema or evidence of acute osseous abnormality. No thoracic spine metastasis identified.  No thoracic spinal stenosis. Stable thoracic spinal cord with no abnormal cord signal.  Negative visualized posterior paraspinal soft tissues.  Stable visualized  thoracic and upper abdominal viscera.  MR LUMBAR SPINE FINDINGS  Normal lumbar segmentation. Stable lumbar vertebral height and alignment since 2011. Chronic L2 superior endplate Schmorl node.  There is confluent abnormal marrow signal in the L4 vertebral body with early involvement of the anterior pedicle. The right vertebral body is affected encompassing an area of 34 x 23 by 30 mm (AP by transverse by CC). Associated T2 and FLAIR hyperintensity. No loss of L4 vertebral body height. No epidural or extraosseous extension.  No similar lesion identified elsewhere in the lumbar spine or sacrum, but there is 80 31 mm diameter similar bone lesion in the medial right iliac bone on series 16, image 33.  Visualized lower thoracic spinal cord is normal with conus medularis at L1. Normal non contrast cauda equina nerve roots. No lumbar spinal stenosis.  Chronic infrarenal abdominal aortic aneurysm now measures up to 47 mm maximal diameter (40 mm in 2011).  Chronic left renal atrophy. Other visualized abdominal viscera are stable. The bladder is distended, partially visualized.  Posterior paraspinal muscle atrophy.  IMPRESSION: MR THORACIC SPINE IMPRESSION  1. No acute findings in the thoracic spine. No thoracic spinal stenosis. 2. Regressed T9 bone metastasis since 2014.  MR LUMBAR SPINE IMPRESSION  1. Bone metastases in the L4 vertebral body and medial right iliac bone measuring about 3 cm each. No pathologic fracture or extraosseous tumor identified. 2. Progressed chronic infrarenal abdominal aortic aneurysm since 2011, now 47 mm in diameter. Recommend followup by abdomen and pelvis CTA in 6 months, and vascular surgery referral/consultation if not already obtained. This recommendation follows ACR consensus guidelines: White Paper of the ACR Incidental Findings Committee II on Vascular Findings. J Am Coll Radiol 2013; 10:789-794. 3. No lumbar spinal stenosis.   Electronically Signed   By: Lars Pinks M.D.   On: 02/09/2014  11:53   Mr Lumbar Spine Wo Contrast  02/09/2014   CLINICAL DATA:  80 year old male with back pain. Recent falls. Initial encounter. History of chronic thoracic compression fractures. History of right vertebral artery occlusion and cerebellar infarcts.  History of prostate cancer metastatic to bone.  History of melanoma.  EXAM: MRI THORACIC AND LUMBAR SPINE WITHOUT CONTRAST  TECHNIQUE: Multiplanar and multiecho pulse sequences of the thoracic and lumbar spine were obtained without intravenous contrast.  COMPARISON:  Cervical and thoracic MRI 09/28/2012. CT Abdomen and Pelvis 04/27/2009.  FINDINGS: MR THORACIC SPINE FINDINGS  Limited sagittal imaging of the cervical spine is grossly stable. Chronic T6 and T11 compression fractures, the latter previously augmented. Stable thoracic vertebral height and alignment.  Resolved abnormal T1 hypo intensity  in the T9 vertebral body since 2014. No marrow edema or evidence of acute osseous abnormality. No thoracic spine metastasis identified.  No thoracic spinal stenosis. Stable thoracic spinal cord with no abnormal cord signal.  Negative visualized posterior paraspinal soft tissues.  Stable visualized thoracic and upper abdominal viscera.  MR LUMBAR SPINE FINDINGS  Normal lumbar segmentation. Stable lumbar vertebral height and alignment since 2011. Chronic L2 superior endplate Schmorl node.  There is confluent abnormal marrow signal in the L4 vertebral body with early involvement of the anterior pedicle. The right vertebral body is affected encompassing an area of 34 x 23 by 30 mm (AP by transverse by CC). Associated T2 and FLAIR hyperintensity. No loss of L4 vertebral body height. No epidural or extraosseous extension.  No similar lesion identified elsewhere in the lumbar spine or sacrum, but there is 80 31 mm diameter similar bone lesion in the medial right iliac bone on series 16, image 33.  Visualized lower thoracic spinal cord is normal with conus medularis at L1.  Normal non contrast cauda equina nerve roots. No lumbar spinal stenosis.  Chronic infrarenal abdominal aortic aneurysm now measures up to 47 mm maximal diameter (40 mm in 2011).  Chronic left renal atrophy. Other visualized abdominal viscera are stable. The bladder is distended, partially visualized.  Posterior paraspinal muscle atrophy.  IMPRESSION: MR THORACIC SPINE IMPRESSION  1. No acute findings in the thoracic spine. No thoracic spinal stenosis. 2. Regressed T9 bone metastasis since 2014.  MR LUMBAR SPINE IMPRESSION  1. Bone metastases in the L4 vertebral body and medial right iliac bone measuring about 3 cm each. No pathologic fracture or extraosseous tumor identified. 2. Progressed chronic infrarenal abdominal aortic aneurysm since 2011, now 47 mm in diameter. Recommend followup by abdomen and pelvis CTA in 6 months, and vascular surgery referral/consultation if not already obtained. This recommendation follows ACR consensus guidelines: White Paper of the ACR Incidental Findings Committee II on Vascular Findings. J Am Coll Radiol 2013; 10:789-794. 3. No lumbar spinal stenosis.   Electronically Signed   By: Lars Pinks M.D.   On: 02/09/2014 11:53   Ct Hip Left Wo Contrast  02/09/2014   CLINICAL DATA:  Acute onset of left-sided weakness and left hip pain. Four recent falls. Difficulty ambulating. Initial encounter.  EXAM: CT OF THE LEFT HIP WITHOUT CONTRAST  TECHNIQUE: Multidetector CT imaging of the left hip was performed according to the standard protocol. Multiplanar CT image reconstructions were also generated.  COMPARISON:  CT of the abdomen and pelvis performed 04/27/2009  FINDINGS: There is no evidence of fracture or dislocation. Prominent bone islands are again noted at the left femoral head and neck. The left femoral head remains seated at the acetabulum. The left sacroiliac joint is unremarkable in appearance.  Mild focal bony expansion at the left inferior pubic ramus appears to reflect chronic  sequelae of a remote lytic lesion at this location. Mild degenerative change is noted at the lower lumbar spine.  There is focal dilatation of the distal abdominal aorta to 3.4 cm in AP dimension and 4.7 cm in transverse dimension. Diffuse calcification is seen along the distal abdominal aorta and its branches. The prostate appears mildly enlarged. No additional soft tissue abnormalities are seen. Visualized small and large bowel loops are grossly unremarkable.  IMPRESSION: 1. No evidence of fracture or dislocation. 2. Mild focal bony expansion at the left inferior pubic ramus reflects chronic sequelae of a remote lytic lesion at this location; there has been interval  peripheral ossification, without evidence of a recurrent mass. 3. Focal dilatation of the distal abdominal aorta to 3.4 cm in AP dimension and 4.7 cm in transverse dimension, increased in size from 2011. 4. Diffuse calcification along the distal abdominal aorta and its branches. 5. Mildly enlarged prostate.   Electronically Signed   By: Garald Balding M.D.   On: 02/09/2014 04:08     Microbiology: No results found for this or any previous visit (from the past 240 hour(s)).   Labs: Results for orders placed or performed during the hospital encounter of 02/08/14 (from the past 48 hour(s))  Protime-INR     Status: Abnormal   Collection Time: 02/12/14  3:48 AM  Result Value Ref Range   Prothrombin Time 24.1 (H) 11.6 - 15.2 seconds   INR 2.14 (H) 0.00 - 7.32  Basic metabolic panel     Status: Abnormal   Collection Time: 02/12/14  3:48 AM  Result Value Ref Range   Sodium 135 135 - 145 mmol/L    Comment: Please note change in reference range.   Potassium 4.3 3.5 - 5.1 mmol/L    Comment: Please note change in reference range.   Chloride 100 96 - 112 mEq/L   CO2 25 19 - 32 mmol/L   Glucose, Bld 156 (H) 70 - 99 mg/dL   BUN 76 (H) 6 - 23 mg/dL   Creatinine, Ser 1.60 (H) 0.50 - 1.35 mg/dL   Calcium 8.7 8.4 - 10.5 mg/dL   GFR calc non Af  Amer 37 (L) >90 mL/min   GFR calc Af Amer 43 (L) >90 mL/min    Comment: (NOTE) The eGFR has been calculated using the CKD EPI equation. This calculation has not been validated in all clinical situations. eGFR's persistently <90 mL/min signify possible Chronic Kidney Disease.    Anion gap 10 5 - 15  Protime-INR     Status: Abnormal   Collection Time: 02/13/14  3:50 AM  Result Value Ref Range   Prothrombin Time 26.3 (H) 11.6 - 15.2 seconds   INR 2.40 (H) 0.00 - 2.02  Basic metabolic panel     Status: Abnormal   Collection Time: 02/13/14  3:50 AM  Result Value Ref Range   Sodium 135 135 - 145 mmol/L    Comment: Please note change in reference range.   Potassium 4.6 3.5 - 5.1 mmol/L    Comment: Please note change in reference range.   Chloride 101 96 - 112 mEq/L   CO2 26 19 - 32 mmol/L   Glucose, Bld 160 (H) 70 - 99 mg/dL   BUN 79 (H) 6 - 23 mg/dL   Creatinine, Ser 1.40 (H) 0.50 - 1.35 mg/dL   Calcium 8.8 8.4 - 10.5 mg/dL   GFR calc non Af Amer 44 (L) >90 mL/min   GFR calc Af Amer 51 (L) >90 mL/min    Comment: (NOTE) The eGFR has been calculated using the CKD EPI equation. This calculation has not been validated in all clinical situations. eGFR's persistently <90 mL/min signify possible Chronic Kidney Disease.    Anion gap 8 5 - 15     Brief history of present illness  Patient is a 80 year old male, retired [employer], with hypertension, atrial fibrillation, prior history of stroke presented with left-sided weakness that started on the morning of admission. Per his wife, patient fell on December 8, hit his back and has hematoma in the right forearm. Last Saturday, 7 days ago he fell 3 times, hit his left  knee. Subsequently he saw his orthopedic physician, x-ray of his left knee and hip were negative. Patient had steroid injection in the left knee. After the injection he was doing better and was able to use his walker. A day prior to admission patient was noticed to be sluggish  and woke up with left-sided weakness difficulty with ambulation. Otherwise no acute slurred speech, no vision changes confusion or any focal neurological deficits. Patient was on Casodex and chemotherapy for prostrate cancer which was stopped last week due to concerns of causing weakness.  Assessment/Plan:  Back, left hip/thigh pain Weakness with left sided weakness: The weakness appears to be more due to pain in the lower back radiating to the left hip and thigh - MRI of the brain was done which showed remote cerebellar infarct and cortical infarct in the right frontal lobe however no new acute infarct. - CT of the left hip was negative for any fracture or dislocation. Placed on Flexeril prn, scheduled Tylenol, Percocet as needed. Patient did not tolerate tramadol as he became very lethargic and bradycardiac with that. Tramadol was discontinued. - Given his history of prostrate cancer, MRI thoracic and lumbar spine done and showed bony metastases in the L4 vertebral body, medial right iliac bone measuring about 3 cm each, no pathological fracture or tumor identified.  - I had discussed with oncology, Dr. Julien Nordmann, recommended radiation therapy. Radiation oncology Dr. Lisbeth Renshaw recommended simulation today and first radiation treatment on 12/23. patient will receive 18 Gy in 1 fraction to the L4 spine metastasis and 40 gray in 5 fractions to the right iliac metastasis.. Patient also started on IV Decadron, switch to by mouth Decadron Patient also seen by Dr. Joaquim Lai neurosurgery who recommended  stereotactic radiosurgery to the L4 vertebral lesion Patient will be discharged home today with home health PT, OT, HHA. Case management consult was obtained and arrange with [facility].    History of chronic diastolic heart failure Elevated BNP - Currently euvolemic, cardiology consulted - 2-D echo was done, but results are pending Cardiology recommends holding Lasix, may be started in one to 2  days post discharge Patient started on ARB, Cozaar 100 mg a day because of increased blood pressure prior to discharge    Atrial fibrillation - Rate control, continue warfarin, INR therapeutic prior to discharge - Patient had bradycardia issue due to tramadol, currently stable  - Cardiology has been following , Dr. Mare Ferrari   Hypercholesterolemia - Continue statin   Prostate cancer - Currently chemotherapy on hold, defer to outpatient oncologist, Dr. Alen Blew  Mild acute on chronic renal insufficiency- improving - worsened due to enablex which was discontinued. Hold the Lasix, hydrochlorothiazide    Code Status:Full code  Family Communication: Discussed in detail with patient's wife at the bedside  Disposition: transferred to [facility] today, hopefully DC home tomorrow after the radiation therapy  Consultants   Cardiology Dr. Malachy Moan Onc Dr. Lisbeth Renshaw  Procedures : MRI of the lumbar spine 2-D echo  Antibiotics:  None    Discharge Exam:   Blood pressure 192/85, pulse 75, temperature 97.6 F (36.4 C), temperature source Oral, resp. rate 18, height 6' (1.829 m), weight 85.231 kg (187 lb 14.4 oz), SpO2 99 %. General: No distress Lungs: Clear Heart: Irregular Abdomen: Positive bowel sounds, no rebound no guarding Extremities: No edema         Discharge Instructions    Diet - low sodium heart healthy    Complete by:  As directed  Increase activity slowly    Complete by:  As directed            Follow-up Information    Follow up with [facility], MD. Schedule an appointment as soon as possible for a visit on 03/22/2014.   Specialty:  Oncology   Why:  at 2:30PM for hospital follow-up   Contact information:   [address] [place] [address] [phone]       Follow up with [name], MD. Schedule an appointment as soon as possible for a visit in 2 weeks.   Specialty:  Cardiology   Why:  for hospital follow-up    Contact information:   [address] [place] [address] [phone]       Signed: Reyne Dumas 02/13/2014, 11:43 AM

## 2014-02-13 NOTE — Op Note (Addendum)
   Name: Craig [name].  MRN: [id number]  Date: 02/13/2014   DOB: [birth year]  Stereotactic Radiosurgery Operative Note  PRE-OPERATIVE DIAGNOSIS:  Spinal Metastasis  POST-OPERATIVE DIAGNOSIS:  Spinal Metastasis  PROCEDURE:  Stereotactic Radiosurgery  SURGEON:  Peggyann Shoals, MD  NARRATIVE: The patient underwent a radiation treatment planning session in the radiation oncology simulation suite under the care of the radiation oncology physician and physicist.  I participated closely in the radiation treatment planning afterwards. The patient underwent planning CT which was fused to the MRI.  These images were fused on the planning system.  Radiation oncology contoured the gross target volume and subsequently expanded this to yield the Planning Target Volume. I actively participated in the planning process.  I helped to define and review the target contours and also the contours of the spinal cord, and selected nearby organs at risk.  All the dose constraints for critical structures were reviewed and compared to AAPM Task Group 101.  The prescription dose conformity was reviewed.  I approved the plan electronically.    Accordingly, [name] was brought to the TrueBeam stereotactic radiation treatment linac and placed in the custom immobilization device.  The patient was aligned according to the IR fiducial markers with BrainLab Exactrac, then orthogonal x-rays were used in ExacTrac with the 6DOF robotic table and the shifts were made to align the patient.  Then conebeam CT was performed to verify precision.  [name] received stereotactic radiosurgery uneventfully.  The detailed description of the procedure is recorded in the radiation oncology procedure note.  I was present for the duration of the procedure.  DISPOSITION:  Following delivery, the patient was transported to nursing in stable condition and monitored for possible acute effects to be discharged to home in  stable condition with follow-up in one month.  Peggyann Shoals, MD 02/13/2014 6:03 PM

## 2014-02-13 NOTE — Patient Instructions (Signed)
Take 3 tablets decadron 4mg  in the morning for 3 days, then 2 tablets in the morning for 5 days, then 1 tablet in the morning for 5 days, then 1/2 tablet in the morning for 5 days. Then stop medication.

## 2014-02-13 NOTE — Progress Notes (Signed)
Craig [name] is here in a wheelchair with his family.  He denies pain, headache, dizziness, double vision and nausea.  He is alert and oriented to person, place and time.  Call light in reach.  Will continue to monitor.

## 2014-02-13 NOTE — Progress Notes (Signed)
ANTICOAGULATION CONSULT NOTE - Follow Up Consult  Pharmacy Consult for Coumadin Indication: atrial fibrillation and CVA  Allergies  Allergen Reactions  . Pravachol     Muscle weakness  . Zocor [Simvastatin - High Dose]     Muscle weakness    Patient Measurements: Height: 6' (182.9 cm) Weight: 187 lb 14.4 oz (85.231 kg) IBW/kg (Calculated) : 77.6  Vital Signs: Temp: 97.6 F (36.4 C) (12/23 0514) Temp Source: Oral (12/23 0514) BP: 192/85 mmHg (12/23 0514) Pulse Rate: 75 (12/23 0514)  Labs:  Recent Labs  02/11/14 0706 02/11/14 0840 02/12/14 0348 02/13/14 0350  HGB  --  13.3  --   --   HCT  --  40.0  --   --   PLT  --  219  --   --   LABPROT 23.0*  --  24.1* 26.3*  INR 2.02*  --  2.14* 2.40*  CREATININE  --  1.43* 1.60* 1.40*    Estimated Creatinine Clearance: 40.8 mL/min (by C-G formula based on Cr of 1.4).  Assessment: Weakness, fatigue, increased falls (bony mets)  80 yo who was admitted for left sided weakness. He also has a hx of afib on chronic coumadin. Cards goal of INR is lower for his case (1.8-2.2).  Anticoagulation: Warf-Rx: hx Afib, CVA; INR 2.14 (goal 1.8-2.2 per CC). INR 2.02 today. Hgb improved 13.3. Plts 219 ok. Hematoma R forearm noted from fall 12/8. - PTA coumadin = 2.5mg  qday except 5mg  MWF  Today, 12/23:   INR above stated goal of 1.8-22 per CC on home dose as above  No reported bleeding  Patient was eating well but no diet documented for 12/22   Goal of Therapy:  INR 1.8-2.2 Monitor platelets by anticoagulation protocol: Yes   Plan:  1. Generally gets 5mg  warfarin today as per home regimen, but will decrease to 2.5mg  due to INR being slightly above goal 2. Daily INR 3. If discharged, would recommend just resuming home regimen of warfarin as was taking prior to admission   Adrian Saran, PharmD, BCPS Pager [phone] 02/13/2014 10:55 AM

## 2014-02-13 NOTE — Progress Notes (Signed)
He denied pain, headache, dizziness, nausea and blurred vision.  Patient escorted to the lobby with his family.  Assisted him with getting in his car.

## 2014-02-13 NOTE — Discharge Instructions (Signed)
Follow-up recommendations Follow-up with PCP in 5-7 days Resume Lasix in one to 2 days Follow-up CBC and BMP in one week

## 2014-02-18 ENCOUNTER — Telehealth: Payer: Self-pay | Admitting: *Deleted

## 2014-02-18 NOTE — Telephone Encounter (Signed)
Spouse called requesting "orders for lab be entered by Dr. Alen Blew for Dr. Melissa Montane coumadin check.  With our appointments at [facility] we can't make it to the appointment for lab with Dr. Mare Ferrari.  Please order test and fax to Dr. Mare Ferrari."  Instructed to make sure Dr. Mare Ferrari has entered his orders and the results will be in the EMR.

## 2014-02-19 ENCOUNTER — Ambulatory Visit (HOSPITAL_BASED_OUTPATIENT_CLINIC_OR_DEPARTMENT_OTHER): Payer: Medicare Other | Admitting: Oncology

## 2014-02-19 ENCOUNTER — Other Ambulatory Visit (HOSPITAL_BASED_OUTPATIENT_CLINIC_OR_DEPARTMENT_OTHER): Payer: Medicare Other

## 2014-02-19 ENCOUNTER — Telehealth: Payer: Self-pay | Admitting: Oncology

## 2014-02-19 ENCOUNTER — Ambulatory Visit
Admit: 2014-02-19 | Discharge: 2014-02-19 | Disposition: A | Discharge status: Home / Self Care | Payer: Medicare Other | Attending: Radiation Oncology | Admitting: Radiation Oncology

## 2014-02-19 VITALS — BP 169/66 | HR 66 | Temp 97.7°F | Resp 18 | Wt 195.1 lb

## 2014-02-19 VITALS — BP 158/84 | HR 104 | Temp 98.0°F | Resp 16

## 2014-02-19 DIAGNOSIS — Z510 Encounter for antineoplastic radiation therapy: Secondary | ICD-10-CM | POA: Diagnosis not present

## 2014-02-19 DIAGNOSIS — C7951 Secondary malignant neoplasm of bone: Secondary | ICD-10-CM

## 2014-02-19 DIAGNOSIS — C61 Malignant neoplasm of prostate: Secondary | ICD-10-CM | POA: Diagnosis not present

## 2014-02-19 DIAGNOSIS — I4891 Unspecified atrial fibrillation: Secondary | ICD-10-CM

## 2014-02-19 LAB — COMPREHENSIVE METABOLIC PANEL (CC13)
ALT: 20 U/L (ref 0–55)
AST: 13 U/L (ref 5–34)
Albumin: 3 g/dL — ABNORMAL LOW (ref 3.5–5.0)
Alkaline Phosphatase: 60 U/L (ref 40–150)
Anion Gap: 11 mEq/L (ref 3–11)
BILIRUBIN TOTAL: 0.71 mg/dL (ref 0.20–1.20)
BUN: 52 mg/dL — ABNORMAL HIGH (ref 7.0–26.0)
CALCIUM: 9 mg/dL (ref 8.4–10.4)
CHLORIDE: 106 meq/L (ref 98–109)
CO2: 23 meq/L (ref 22–29)
Creatinine: 1.5 mg/dL — ABNORMAL HIGH (ref 0.7–1.3)
EGFR: 42 mL/min/{1.73_m2} — ABNORMAL LOW (ref >90)
GLUCOSE: 150 mg/dL — AB (ref 70–140)
Potassium: 5.2 mEq/L — ABNORMAL HIGH (ref 3.5–5.1)
SODIUM: 140 meq/L (ref 136–145)
Total Protein: 6.6 g/dL (ref 6.4–8.3)

## 2014-02-19 LAB — CBC WITH DIFFERENTIAL/PLATELET
BASO%: 0 % (ref 0.0–2.0)
BASOS ABS: 0 10*3/uL (ref 0.0–0.1)
EOS ABS: 0 10*3/uL (ref 0.0–0.5)
EOS%: 0 % (ref 0.0–7.0)
HCT: 42.5 % (ref 38.4–49.9)
HGB: 13.9 g/dL (ref 13.0–17.1)
LYMPH%: 4.2 % — AB (ref 14.0–49.0)
MCH: 32.4 pg (ref 27.2–33.4)
MCHC: 32.7 g/dL (ref 32.0–36.0)
MCV: 99.1 fL — ABNORMAL HIGH (ref 79.3–98.0)
MONO#: 0.2 10*3/uL (ref 0.1–0.9)
MONO%: 1.5 % (ref 0.0–14.0)
NEUT#: 10.6 10*3/uL — ABNORMAL HIGH (ref 1.5–6.5)
NEUT%: 94.3 % — AB (ref 39.0–75.0)
Platelets: 251 10*3/uL (ref 140–400)
RBC: 4.29 10*6/uL (ref 4.20–5.82)
RDW: 14.2 % (ref 11.0–14.6)
WBC: 11.2 10*3/uL — AB (ref 4.0–10.3)
lymph#: 0.5 10*3/uL — ABNORMAL LOW (ref 0.9–3.3)

## 2014-02-19 LAB — POCT INR: INR: 2.3

## 2014-02-19 LAB — PROTIME-INR: INR: 2.3 — AB (ref <=1.1)

## 2014-02-19 NOTE — Progress Notes (Signed)
Nurse monitoring complete following SRS treatment. Patient sat in personal wheelchair for the duration of monitoring. BP and heart rate slightly elevated. Patient denies pain. Patient reports taking decadron 8 mg once every AM. Patient already advised and understands taper provided by Dr. Lisbeth Renshaw. Patient understands to return on Thursday for next treatment. Patient understands to contact staff with needs. Patient discharged home with wife.

## 2014-02-19 NOTE — Progress Notes (Signed)
  Radiation Oncology         [phone]) [phone] ________________________________  Name: Craig [name]. MRN: [id number]  Date: 02/19/2014  DOB: 1925/02/15  Stereotactic Body Radiotherapy Treatment Procedure Note    ICD-9-CM ICD-10-CM   1. Prostate cancer metastatic to bone 185 C61    198.5 C79.51     CURRENT FRACTION:    2  PLANNED FRACTIONS:  5  NARRATIVE:  Craig [name]. was brought to the stereotactic radiation treatment machine and placed supine on the treatment couch. The patient was set up for stereotactic body radiotherapy to his iliac bone on the body fix pillow.  3D TREATMENT PLANNING AND DOSIMETRY:  The patient's radiation plan was reviewed and approved prior to starting treatment.  It showed 3-dimensional radiation distributions overlaid onto the planning CT.  The [facility] for the target structures as well as the organs at risk were reviewed. The documentation of this is filed in the radiation oncology EMR.  SIMULATION VERIFICATION:  The patient underwent CT imaging on the treatment unit.  These were carefully aligned to document that the ablative radiation dose would cover the target volume and maximally spare the nearby organs at risk according to the planned distribution.  SPECIAL TREATMENT PROCEDURE: Craig [name]. received high dose ablative stereotactic body radiotherapy to the planned target volume without unforeseen complications. Treatment was delivered uneventfully. The high doses associated with stereotactic body radiotherapy and the significant potential risks require careful treatment set up and patient monitoring constituting a special treatment procedure   STEREOTACTIC TREATMENT MANAGEMENT:  Following delivery, the patient was evaluated clinically. The patient tolerated treatment without significant acute effects, and was discharged to home in stable condition.    PLAN: Continue treatment as planned.  ________________________________  Sheral Apley.  Tammi Klippel, M.D.

## 2014-02-19 NOTE — Telephone Encounter (Signed)
Pt confirmed labs/ov per 12/29 POF,. Gave pt AVS.... KJ

## 2014-02-19 NOTE — Progress Notes (Signed)
Hematology and Oncology Follow Up Visit  Craig [name] [id number] 1924-10-15 80 y.o. 02/19/2014 3:15 PM Craig [name], MDBrackbill, Craig Moores, MD   Principle Diagnosis: 80 year old with Castration resistant prostate cancer with metastatic disease to the bone. He was initially diagnosed in 2011 PSA of 19 and presented with advanced disease.   Prior Therapy: He is status post combined androgen deprivation with Lupron and Casodex with an excellent response initially and had a PSA nadir down to 1.67. Most recently he developed progression of disease and a PSA up to 5.94 in September of 2014.  Current therapy: He is on Xtandi started on 11/15/2012. He was started on 160 mg initially but the dose was reduced to 80 mg in December of 2014. He continues to be on Lupron and Xgeva done at [facility]. He is status post SRS treatment to the L4 completed on 02/13/2014. He is currently receiving stereotactic body radiotherapy to the iliac bone treatment is ongoing.  Interim History: Craig [name] presents today for a followup visit with his wife. Since the last visit, he was hospitalized between and 02/08/2014 to 02/13/2014 after he presented with falls. His workup revealed metastatic disease at L4 and iliac bone. He is currently receiving radiation therapy. His xtandi was withheld since 02/07/2014.  He is not reporting any hip pain or back pain at this point. He is not reporting any knee pain despite multiple falls on his left knee. He did receive steroid injection in that knee however.  He is reporting some improvement at this time. He continues to ambulate with assistance. His appetite has been excellent and drinks one boost a day.Has not reported any cough or hemoptysis or hematemesis. Does not report any nausea or vomiting or abdominal pain.Is not reporting any back pain or neurological symptoms. Does not report any headaches or blurry vision or double vision. Is not reporting any chest pain  shortness of breath or difficulty breathing. Does not report any constipation or diarrhea. Does not report any hematochezia or melena. Does not report any skin rashes or lesions. Rest of his review of system is unremarkable.  Medications: I have reviewed the patient's current medications.  Current Outpatient Prescriptions  Medication Sig Dispense Refill  . acetaminophen (TYLENOL) 500 MG tablet Take 500 mg by mouth 2 (two) times daily. Takes one in the AM and one qhs    . amLODipine (NORVASC) 5 MG tablet Take 1 tablet (5 mg total) by mouth daily. 90 tablet 1  . atorvastatin (LIPITOR) 10 MG tablet Take 1 tablet (10 mg total) by mouth daily. 90 tablet 11  . B Complex-C (B-COMPLEX WITH VITAMIN C) tablet Take 1 tablet by mouth daily.      . Denosumab (XGEVA [place]) Inject into the skin every 30 (thirty) days. monthly    . dexamethasone (DECADRON) 4 MG tablet Take 1 tablet (4 mg total) by mouth 3 (three) times daily. 90 tablet 1  . Docusate Calcium (STOOL SOFTENER PO) Take 1 capsule by mouth 2 (two) times daily.    . enzalutamide (XTANDI) 40 MG capsule Take 2 capsules (80 mg total) by mouth daily. 60 capsule 0  . furosemide (LASIX) 20 MG tablet Take 20 mg by mouth daily.    Marland Kitchen Leuprolide Acetate (LUPRON DEPOT IM) Inject 1 each into the muscle every 6 (six) months.     Marland Kitchen losartan (COZAAR) 100 MG tablet Take 1 tablet (100 mg total) by mouth daily. 30 tablet 2  . multivitamin (THERAGRAN) per tablet Take  1 tablet by mouth daily. ( with B - Complex )    . Omega-3 Fatty Acids (FISH OIL PO) Take 1 capsule by mouth daily. ( Mega Red )    . pantoprazole (PROTONIX) 40 MG tablet Take 1 tablet (40 mg total) by mouth daily. While on steroids 30 tablet 3  . solifenacin (VESICARE) 5 MG tablet Take 5 mg by mouth daily.    Marland Kitchen warfarin (COUMADIN) 5 MG tablet Take 1 tablet (5 mg total) by mouth as directed. (Patient taking differently: Take 2.5-5 mg by mouth daily. 5mg  daily on Monday Wednesday and Friday, 2.5mg  daily all  other days) 30 tablet 3  . ZETIA 10 MG tablet TAKE 1 TABLET BY MOUTH ONCE DAILY 30 tablet 5   No current facility-administered medications for this visit.     Allergies:  Allergies  Allergen Reactions  . Pravachol     Muscle weakness  . Zocor [Simvastatin - High Dose]     Muscle weakness    Past Medical History, Surgical history, Social history, and Family History were reviewed and updated.   Physical Exam: There were no vitals taken for this visit. ECOG: 2 General appearance: alert, NAD. Head: Normocephalic, without obvious abnormality Neck: no adenopathy Lymph nodes: Cervical, supraclavicular, and axillary nodes normal. Heart:regular rate and rhythm, S1, S2 normal, no murmur, click, rub or gallop Lung:chest clear, no wheezing, rales, no dullness to percussion. Abdomin: soft, non-tender, without masses or organomegaly. Good bowel sounds. EXT: 1+ edema noted bilaterally. Neurological examination: Showed no deficits.  Lab Results: Lab Results  Component Value Date   WBC 11.2* 02/19/2014   HGB 13.9 02/19/2014   HCT 42.5 02/19/2014   MCV 99.1* 02/19/2014   PLT 251 02/19/2014     Chemistry      Component Value Date/Time   NA 135 02/13/2014 0350   NA 145 01/16/2014 1113   K 4.6 02/13/2014 0350   K 4.0 01/16/2014 1113   CL 101 02/13/2014 0350   CO2 26 02/13/2014 0350   CO2 31* 01/16/2014 1113   BUN 79* 02/13/2014 0350   BUN 47.5* 01/16/2014 1113   CREATININE 1.40* 02/13/2014 0350   CREATININE 1.7* 01/16/2014 1113      Component Value Date/Time   CALCIUM 8.8 02/13/2014 0350   CALCIUM 10.9* 01/16/2014 1113   ALKPHOS 62 02/09/2014 0308   ALKPHOS 59 01/16/2014 1113   AST 14 02/09/2014 0308   AST 17 01/16/2014 1113   ALT 13 02/09/2014 0308   ALT 10 01/16/2014 1113   BILITOT 0.8 02/09/2014 0308   BILITOT 0.76 01/16/2014 1113         Results for Craig [name] (MRN [id number]) as of 02/19/2014 15:21  Ref. Range 11/07/2013 14:51 12/11/2013 12:45  01/16/2014 11:13  PSA Latest Range: <=4.00 ng/mL 4.91 (H) 5.11 (H) 4.91 (H)       Impression and Plan:  80 year old gentleman with the following issues:   1. Castration resistant prostate cancer with metastatic disease to the bone. He is currently on reduced dose Xtandi with intermittent breaks and tolerating it well. His treatment has been on hold for the time being due to weakness and falls. I recommended continue to hold xtandi to radiation therapy is completed. His PSA continue to be under reasonable control.  2. Androgen depravation: He is to continue Lupron at this time. He was recently given at [facility]  3. Bone directed therapy: I recommend him to continue Xgeva which she has been getting on a monthly  basis. This is given at [facility].  4. Hypercalcemia: His calcium level is pending today but elevated on previous testing.  5. L4 and iliac bone metastasis: He is receiving stereotactic body radiation without any complications at this time.  6. Followup: Will be in 4  weeks.  Zola Button, MD 12/29/20153:15 PM

## 2014-02-20 ENCOUNTER — Ambulatory Visit (INDEPENDENT_AMBULATORY_CARE_PROVIDER_SITE_OTHER): Payer: Medicare Other | Admitting: Pharmacist

## 2014-02-20 DIAGNOSIS — I482 Chronic atrial fibrillation, unspecified: Secondary | ICD-10-CM

## 2014-02-20 DIAGNOSIS — Z5181 Encounter for therapeutic drug level monitoring: Secondary | ICD-10-CM

## 2014-02-20 LAB — PSA: PSA: 11.13 ng/mL — ABNORMAL HIGH (ref <=4.00)

## 2014-02-21 ENCOUNTER — Ambulatory Visit
Admission: RE | Admit: 2014-02-21 | Discharge: 2014-02-21 | Disposition: A | Discharge status: Home / Self Care | Payer: Medicare Other | Source: Ambulatory Visit | Attending: Radiation Oncology | Admitting: Radiation Oncology

## 2014-02-21 VITALS — BP 182/74 | HR 72

## 2014-02-21 DIAGNOSIS — Z510 Encounter for antineoplastic radiation therapy: Secondary | ICD-10-CM | POA: Diagnosis not present

## 2014-02-21 DIAGNOSIS — C61 Malignant neoplasm of prostate: Secondary | ICD-10-CM

## 2014-02-21 DIAGNOSIS — C7951 Secondary malignant neoplasm of bone: Principal | ICD-10-CM

## 2014-02-21 NOTE — Progress Notes (Signed)
  Radiation Oncology         [phone]) [phone] ________________________________  Name: Craig [name]. MRN: [id number]  Date: 02/21/2014  DOB: August 27, 1924  Stereotactic Body Radiotherapy Treatment Procedure Note    ICD-9-CM ICD-10-CM   1. Prostate cancer metastatic to bone 185 C61    198.5 C79.51     CURRENT FRACTION:    3  PLANNED FRACTIONS:  5  NARRATIVE:  Craig [name]. was brought to the stereotactic radiation treatment machine and placed supine on the treatment couch. The patient was set up for stereotactic body radiotherapy to his iliac bone on the body fix pillow.  3D TREATMENT PLANNING AND DOSIMETRY:  The patient's radiation plan was reviewed and approved prior to starting treatment.  It showed 3-dimensional radiation distributions overlaid onto the planning CT.  The [facility] for the target structures as well as the organs at risk were reviewed. The documentation of this is filed in the radiation oncology EMR.  SIMULATION VERIFICATION:  The patient underwent CT imaging on the treatment unit.  These were carefully aligned to document that the ablative radiation dose would cover the target volume and maximally spare the nearby organs at risk according to the planned distribution.  SPECIAL TREATMENT PROCEDURE: [place]. received high dose ablative stereotactic body radiotherapy to the planned target volume without unforeseen complications. Treatment was delivered uneventfully. The high doses associated with stereotactic body radiotherapy and the significant potential risks require careful treatment set up and patient monitoring constituting a special treatment procedure   STEREOTACTIC TREATMENT MANAGEMENT:  Following delivery, the patient was evaluated clinically. The patient tolerated treatment without significant acute effects, and was discharged to home in stable condition.    PLAN: Continue treatment as planned.  ________________________________  Sheral Apley.  Tammi Klippel, M.D.

## 2014-02-21 NOTE — Progress Notes (Signed)
Patient resting quietly in wheelchair in room 1 with wife at his side. He has completed SRS #3 to his right iliac bone. He denies pain. His wife states his BM's are soft with a few each day vs constipation with BMs every 3 days. She has stopped his stool softener.  1:00 pm. Patient remains stable. Discharged home per wheelchair; wife taking pt to lobby. Reminded pt and wife that should the need arise, the [facility] has dr on call over the weekends. Pt's next [facility] Mon, 02/25/14.

## 2014-02-25 ENCOUNTER — Ambulatory Visit
Admit: 2014-02-25 | Discharge: 2014-02-25 | Disposition: A | Discharge status: Home / Self Care | Payer: Medicare Other | Attending: Radiation Oncology | Admitting: Radiation Oncology

## 2014-02-25 ENCOUNTER — Encounter: Payer: Self-pay | Admitting: Radiation Oncology

## 2014-02-25 VITALS — BP 194/87 | HR 81 | Temp 97.4°F | Resp 20

## 2014-02-25 DIAGNOSIS — C7951 Secondary malignant neoplasm of bone: Secondary | ICD-10-CM

## 2014-02-25 DIAGNOSIS — Z510 Encounter for antineoplastic radiation therapy: Secondary | ICD-10-CM | POA: Diagnosis not present

## 2014-02-25 NOTE — Progress Notes (Signed)
Patient here s/p SRS tx#4, denies pain or head aches, nausea,blurred vision, appetite god, patient using his walker, will monitor 15 minutes then can be d/c home 12:51 PM

## 2014-02-27 ENCOUNTER — Ambulatory Visit
Admit: 2014-02-27 | Discharge: 2014-02-27 | Disposition: A | Discharge status: Home / Self Care | Payer: Medicare Other | Attending: Radiation Oncology | Admitting: Radiation Oncology

## 2014-02-27 VITALS — BP 170/86 | HR 84 | Temp 97.8°F | Resp 18

## 2014-02-27 DIAGNOSIS — Z510 Encounter for antineoplastic radiation therapy: Secondary | ICD-10-CM | POA: Diagnosis not present

## 2014-02-27 DIAGNOSIS — C7951 Secondary malignant neoplasm of bone: Secondary | ICD-10-CM

## 2014-02-27 NOTE — Progress Notes (Signed)
Patient has completed 5 of 5 SRS to spine for  Prostate mets.Tolerated well.Denies pain or any other problems.Contine dexamethasone taper as ordered and continue protonix.Scheduled for one month follow up.Knows to call if any concerns arise.

## 2014-02-28 ENCOUNTER — Telehealth: Payer: Self-pay | Admitting: *Deleted

## 2014-02-28 NOTE — Telephone Encounter (Signed)
Wife Craig [name] calling to say patient started back on the xtandi today. Note to dr Hazeline Junker desk.

## 2014-03-01 ENCOUNTER — Encounter: Payer: Self-pay | Admitting: Cardiology

## 2014-03-01 ENCOUNTER — Ambulatory Visit (INDEPENDENT_AMBULATORY_CARE_PROVIDER_SITE_OTHER): Payer: Medicare Other | Admitting: Cardiology

## 2014-03-01 VITALS — BP 158/90 | HR 61 | Ht 71.0 in | Wt 197.0 lb

## 2014-03-01 DIAGNOSIS — I119 Hypertensive heart disease without heart failure: Secondary | ICD-10-CM

## 2014-03-01 DIAGNOSIS — N184 Chronic kidney disease, stage 4 (severe): Secondary | ICD-10-CM

## 2014-03-01 DIAGNOSIS — C7951 Secondary malignant neoplasm of bone: Secondary | ICD-10-CM

## 2014-03-01 DIAGNOSIS — C61 Malignant neoplasm of prostate: Secondary | ICD-10-CM

## 2014-03-01 DIAGNOSIS — I482 Chronic atrial fibrillation, unspecified: Secondary | ICD-10-CM

## 2014-03-01 LAB — BASIC METABOLIC PANEL
BUN: 43 mg/dL — ABNORMAL HIGH (ref 6–23)
CALCIUM: 10 mg/dL (ref 8.4–10.5)
CO2: 24 meq/L (ref 19–32)
Chloride: 108 mEq/L (ref 96–112)
Creatinine, Ser: 1.4 mg/dL (ref 0.4–1.5)
GFR: 52.21 mL/min — AB (ref >60.00)
Glucose, Bld: 83 mg/dL (ref 70–99)
POTASSIUM: 5.3 meq/L — AB (ref 3.5–5.1)
Sodium: 145 mEq/L (ref 135–145)

## 2014-03-01 MED ORDER — FUROSEMIDE 40 MG PO TABS: 40.0000 mg | ORAL_TABLET | Freq: Every day | ORAL | Status: DC

## 2014-03-01 NOTE — Progress Notes (Signed)
Craig [name]. Date of Birth:  [birth year] [facility] [address] 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 He has a history of essential hypertension and a previous thrombotic stroke. Over the summer 2014 he was hospitalized for a suspected mild stroke with transient left-sided weakness.  He had another episode of questionable weakness and was admitted to the hospitalist service in mid December 2015.  He has established atrial fibrillation and is on Coumadin. He has hypercholesterolemia and essential hypertension. He also has metastatic prostate cancer. His last visit he has been doing well. He is on injections for his bony metastases which have helped and he gets the injections once a month. He is now under the care of Dr. Osker Mason.Marland Kitchen He is on new medication Xtandi.40 mg 4 tablets a day. His PSA has been declining. He is having more overall weakness and also more incontinence. He now has to wear depends. He has nocturia x2. Since last visit he has not been having any new cardiac symptoms.  He has a past history of epistaxis which has improved since we cut back on the dosing of his warfarin to a lower: INR 1.8 up to 2.2.  Since last visit his medication was changed in the hospital.  He is no longer on aspirin.  He continues on warfarin.  He is no longer on [place] which contained HCTZ.  His heart rate in the hospital was slow.  His Lasix was held initially in the hospital and then resumed at a lower dose. Since being home he has had weight gain and worsening peripheral edema on Lasix 20 mg daily Current Outpatient Prescriptions  Medication Sig Dispense Refill  . acetaminophen (TYLENOL) 500 MG tablet Take 500 mg by mouth 2 (two) times daily. Takes one in the AM and one qhs    . amLODipine (NORVASC) 5 MG tablet Take 1 tablet (5  mg total) by mouth daily. 90 tablet 1  . atorvastatin (LIPITOR) 10 MG tablet Take 1 tablet (10 mg total) by mouth daily. 90 tablet 11  . B Complex-C (B-COMPLEX WITH VITAMIN C) tablet Take 1 tablet by mouth daily.      . Denosumab (XGEVA [place]) Inject into the skin every 30 (thirty) days. monthly    . dexamethasone (DECADRON) 4 MG tablet Take 1 tablet (4 mg total) by mouth 3 (three) times daily. 90 tablet 1  . Docusate Calcium (STOOL SOFTENER PO) Take 1 capsule by mouth 2 (two) times daily.    . enzalutamide (XTANDI) 40 MG capsule Take 2 capsules (80 mg total) by mouth daily. 60 capsule 0  . furosemide (LASIX) 40 MG tablet Take 1 tablet (40 mg total) by mouth daily. 90 tablet 3  . Leuprolide Acetate (LUPRON DEPOT IM) Inject 1 each into the muscle every 6 (six) months.     Marland Kitchen losartan (COZAAR) 100 MG tablet Take 1 tablet (100 mg total) by mouth daily. 30 tablet 2  . multivitamin (THERAGRAN) per tablet Take 1 tablet by mouth daily. ( with B - Complex )    . Omega-3 Fatty Acids (FISH OIL PO) Take 1 capsule by mouth daily. ( Mega Red )    . pantoprazole (PROTONIX) 40 MG tablet Take 1 tablet (40 mg total) by mouth daily. While on steroids 30 tablet 3  .  potassium chloride SA (K-DUR,KLOR-CON) 20 MEQ tablet Take 20 mEq by mouth daily.    . solifenacin (VESICARE) 5 MG tablet Take 5 mg by mouth daily.    Marland Kitchen warfarin (COUMADIN) 5 MG tablet Take 1 tablet (5 mg total) by mouth as directed. (Patient taking differently: Take 2.5-5 mg by mouth daily. 5mg  daily on Monday Wednesday and Friday, 2.5mg  daily all other days) 30 tablet 3  . ZETIA 10 MG tablet TAKE 1 TABLET BY MOUTH ONCE DAILY 30 tablet 5   No current facility-administered medications for this visit.    Allergies  Allergen Reactions  . Pravachol     Muscle weakness  . Zocor [Simvastatin - High Dose]     Muscle weakness    Patient Active Problem List   Diagnosis Date Noted  . Cerebellar stroke syndrome 07/27/2010    Priority: High  . Benign  hypertensive heart disease without heart failure 07/27/2010    Priority: High  . Chronic atrial fibrillation 05/25/2010    Priority: High  . Bone metastasis 02/12/2014  . Chronic diastolic heart failure 94/49/6759  . Hypertensive heart disease 02/11/2014  . Essential hypertension 02/11/2014  . History of stroke 02/11/2014  . AAA (abdominal aortic aneurysm) 02/11/2014  . Hypokalemia 02/11/2014  . Back pain 02/09/2014  . Fluid overload 02/08/2014  . Left-sided weakness 02/08/2014  . Encounter for therapeutic drug monitoring 05/14/2013  . Chronic kidney disease (CKD), stage III (moderate) 09/27/2012  . Elevated brain natriuretic peptide (BNP) level 09/27/2012  . Hypercalcemia 09/27/2012  . Weakness 09/27/2012  . Epistaxis 01/08/2011  . Hypercholesterolemia 07/27/2010  . Prostate cancer metastatic to bone 07/27/2010  . Renal insufficiency 07/27/2010    History  Smoking status  . Former Smoker -- .5 years  . Types: Cigarettes  . Quit date: 02/22/1953  Smokeless tobacco  . Never Used    History  Alcohol Use  . Yes    Comment: 09/27/2012 "glass of wine or 2/month"    Family History  Problem Relation Age of Onset  . Heart failure Mother   . Heart attack Father     Review of Systems: Constitutional: no fever chills diaphoresis or fatigue or change in weight.  Head and neck: no hearing loss, no epistaxis, no photophobia or visual disturbance. Respiratory: No cough, shortness of breath or wheezing. Cardiovascular: No chest pain peripheral edema, palpitations. Gastrointestinal: No abdominal distention, no abdominal pain, no change in bowel habits hematochezia or melena. Genitourinary: No dysuria, no frequency, no urgency, no nocturia. Musculoskeletal:No arthralgias, no back pain, no gait disturbance or myalgias. Neurological: No dizziness, no headaches, no numbness, no seizures, no syncope, no weakness, no tremors. Hematologic: No lymphadenopathy, no easy  bruising. Psychiatric: No confusion, no hallucinations, no sleep disturbance.    Physical Exam: Filed Vitals:   03/01/14 1019  BP: 158/90  Pulse: 61   the general appearance reveals an elderly gentleman who arrives in a wheelchair.The head and neck exam reveals pupils equal and reactive.  Extraocular movements are full.  There is no scleral icterus.  The mouth and pharynx are normal.  The neck is supple.  The carotids reveal no bruits.  The jugular venous pressure is normal.  The  thyroid is not enlarged.  There is no lymphadenopathy.  The chest is clear to percussion and auscultation.  There are no rales or rhonchi.  Expansion of the chest is symmetrical.  The precordium is quiet.  The heart rhythm is irregularly irregular. The first heart sound is normal.  The  second heart sound is physiologically split.  There is no murmur gallop rub or click.  There is no abnormal lift or heave.  The abdomen is soft and nontender.  The bowel sounds are normal.  The liver and spleen are not enlarged.  There are no abdominal masses.  There are no abdominal bruits.  Extremities reveal good pedal pulses.  There is moderate pretibial and ankle edema worse on the left.  There is no cyanosis or clubbing.  Strength is normal and symmetrical in all extremities.  There is no lateralizing weakness.  There are no sensory deficits.  The skin is warm and dry.  There is no rash.  .   Assessment / Plan: 1. permanent atrial fibrillation on Coumadin 2. essential hypertension without heart failure 3. Hyperlipidemia 4. old stroke 5. prostate cancer metastatic to bone 6.  Chronic kidney disease stage III 7.  Mild volume overload  Disposition: We will increase his furosemide back to 40 mg daily.  We will not restart his HCTZ. Will continue on the care of his metastatic prostate cancer as per his oncologist and urologist.  He is back on [place]. Recheck here in 2 months for office visit and basal metabolic panel. We are  rechecking basal metabolic panel today to see if he needs supplemental potassium or not.  He is not currently taking it.

## 2014-03-01 NOTE — Patient Instructions (Addendum)
Will obtain labs today and call you with the results (BMET )  INCREASE YOUR LASIX (FUROSEMIDE) TO 40 MG DAILY, NEW RX SENT TO P/G  Your physician recommends that you schedule a follow-up appointment in: 2 MONTH OV/BMET

## 2014-03-04 ENCOUNTER — Telehealth: Payer: Self-pay | Admitting: Cardiology

## 2014-03-04 NOTE — Telephone Encounter (Signed)
New Message  Pt wife returning [place] phone call. Please call back and discuss.

## 2014-03-04 NOTE — Telephone Encounter (Signed)
Spoke with wife and reported labs, Advised to d/c potassium

## 2014-03-07 ENCOUNTER — Telehealth: Payer: Self-pay | Admitting: *Deleted

## 2014-03-07 NOTE — Telephone Encounter (Signed)
Wife called asking if radiation to patient' pelvis and spine woulld make his right leg weaker than the left leg, he is getting physical therapy at home and she and therapist both noticed as of 1/87/16 that his leg was weaker, his last [place] treatment was on 02/27/14, he jsut restarted xtandi as well  And this has mad his both legs weaker in the past, will speak with MD and call her back today 4:00 PM

## 2014-03-07 NOTE — Telephone Encounter (Signed)
Addendum, right leg weaker than left  On 03/01/14  Mistyped error on previous note 4:20 PM

## 2014-03-08 ENCOUNTER — Encounter: Payer: Self-pay | Admitting: Radiation Oncology

## 2014-03-11 NOTE — Progress Notes (Signed)
I spoke to the patient's wife regarding her complaint of some increased weakness in the right lower extremity. She states that she has noticed this to some degree as did his therapist. He has also restarted Xtandi which also has caused some weakness in the past. I discussed with her that I would not expect any weakness at this point, and hopefully not at any point, based on his radiation treatment. The treatment to the right pelvis would not be expected to cause any weakness at this point. Certainly however, irritation of the nerve or damage to the nerve is a possibility for radiosurgery to the spine and sacrum. Tumor progression also can have a similar effect, as good some degree of deconditioning. The patient was not felt to be at elevated risk for this. She indicated that she would continue to follow this and let us know if this issue worsens.

## 2014-03-12 ENCOUNTER — Other Ambulatory Visit: Payer: Self-pay | Admitting: Oncology

## 2014-03-18 ENCOUNTER — Telehealth: Payer: Self-pay | Admitting: *Deleted

## 2014-03-18 NOTE — Telephone Encounter (Signed)
Called [facility] and requested papers for orders to continue P.T. Twice weekly for 3 weeks.

## 2014-03-18 NOTE — Telephone Encounter (Signed)
WOULD LIKE TO CONTINUE PHYSICAL THERAPY TWICE A WEEK FOR THREE WEEKS. PLEASE CALL A VERBAL ORDER TO [facility] AND THE PAPERWORK WILL BE SENT TO DR.SHADAD'S OFFICE.

## 2014-03-22 ENCOUNTER — Other Ambulatory Visit (HOSPITAL_BASED_OUTPATIENT_CLINIC_OR_DEPARTMENT_OTHER): Payer: Medicare Other

## 2014-03-22 ENCOUNTER — Telehealth: Payer: Self-pay | Admitting: Oncology

## 2014-03-22 ENCOUNTER — Ambulatory Visit (HOSPITAL_BASED_OUTPATIENT_CLINIC_OR_DEPARTMENT_OTHER): Payer: Medicare Other | Admitting: Oncology

## 2014-03-22 ENCOUNTER — Telehealth: Payer: Self-pay | Admitting: Cardiology

## 2014-03-22 ENCOUNTER — Ambulatory Visit (INDEPENDENT_AMBULATORY_CARE_PROVIDER_SITE_OTHER): Payer: Medicare Other | Admitting: Internal Medicine

## 2014-03-22 VITALS — BP 158/74 | HR 87 | Temp 97.5°F | Resp 18 | Ht 71.0 in | Wt 200.8 lb

## 2014-03-22 DIAGNOSIS — C61 Malignant neoplasm of prostate: Secondary | ICD-10-CM

## 2014-03-22 DIAGNOSIS — C7951 Secondary malignant neoplasm of bone: Principal | ICD-10-CM

## 2014-03-22 DIAGNOSIS — Z5181 Encounter for therapeutic drug level monitoring: Secondary | ICD-10-CM

## 2014-03-22 DIAGNOSIS — I4891 Unspecified atrial fibrillation: Secondary | ICD-10-CM

## 2014-03-22 DIAGNOSIS — I482 Chronic atrial fibrillation, unspecified: Secondary | ICD-10-CM

## 2014-03-22 LAB — COMPREHENSIVE METABOLIC PANEL (CC13)
ALBUMIN: 2.9 g/dL — AB (ref 3.5–5.0)
ALK PHOS: 62 U/L (ref 40–150)
ALT: 8 U/L (ref 0–55)
AST: 15 U/L (ref 5–34)
Anion Gap: 9 mEq/L (ref 3–11)
BILIRUBIN TOTAL: 0.83 mg/dL (ref 0.20–1.20)
BUN: 28.4 mg/dL — AB (ref 7.0–26.0)
CO2: 27 mEq/L (ref 22–29)
CREATININE: 1.4 mg/dL — AB (ref 0.7–1.3)
Calcium: 9.9 mg/dL (ref 8.4–10.4)
Chloride: 108 mEq/L (ref 98–109)
EGFR: 46 mL/min/{1.73_m2} — AB (ref >90)
Glucose: 100 mg/dl (ref 70–140)
Potassium: 4.4 mEq/L (ref 3.5–5.1)
Sodium: 144 mEq/L (ref 136–145)
TOTAL PROTEIN: 6.3 g/dL — AB (ref 6.4–8.3)

## 2014-03-22 LAB — CBC WITH DIFFERENTIAL/PLATELET
BASO%: 0.5 % (ref 0.0–2.0)
Basophils Absolute: 0 10*3/uL (ref 0.0–0.1)
EOS ABS: 0.1 10*3/uL (ref 0.0–0.5)
EOS%: 2 % (ref 0.0–7.0)
HEMATOCRIT: 40.3 % (ref 38.4–49.9)
HGB: 12.8 g/dL — ABNORMAL LOW (ref 13.0–17.1)
LYMPH%: 26.2 % (ref 14.0–49.0)
MCH: 31.9 pg (ref 27.2–33.4)
MCHC: 31.8 g/dL — AB (ref 32.0–36.0)
MCV: 100.2 fL — ABNORMAL HIGH (ref 79.3–98.0)
MONO#: 0.7 10*3/uL (ref 0.1–0.9)
MONO%: 10.9 % (ref 0.0–14.0)
NEUT#: 3.6 10*3/uL (ref 1.5–6.5)
NEUT%: 60.4 % (ref 39.0–75.0)
Platelets: 235 10*3/uL (ref 140–400)
RBC: 4.02 10*6/uL — ABNORMAL LOW (ref 4.20–5.82)
RDW: 16.7 % — ABNORMAL HIGH (ref 11.0–14.6)
WBC: 6 10*3/uL (ref 4.0–10.3)
lymph#: 1.6 10*3/uL (ref 0.9–3.3)

## 2014-03-22 LAB — PROTIME-INR: INR: 2.4 — AB (ref <=1.1)

## 2014-03-22 NOTE — Progress Notes (Signed)
Hematology and Oncology Follow Up Visit  Craig [name] [id number] Sep 08, 1924 81 y.o. 03/22/2014 10:43 AM Craig [name], MDBrackbill, Craig Moores, MD   Principle Diagnosis: 81 year old with Castration resistant prostate cancer with metastatic disease to the bone. He was initially diagnosed in 2011 PSA of 19 and presented with advanced disease.   Prior Therapy: He is status post combined androgen deprivation with Lupron and Casodex with an excellent response initially and had a PSA nadir down to 1.67. Most recently he developed progression of disease and a PSA up to 5.94 in September of 2014. He is status post SRS treatment to the L4 completed on 02/13/2014. He is S/P stereotactic body radiotherapy to the iliac bone treatment completed in 02/2014.  Current therapy: He is on Xtandi started on 11/15/2012. He was started on 160 mg initially but the dose was reduced to 80 mg in December of 2014. He continues to be on Lupron and Xgeva done at [facility]. .  Interim History: [name] presents today for a followup visit with his wife. Since the last visit, he completed radiation to the spine and hip with significant improvement in his symptoms. He is not reporting any hip pain or back pain at this point. He is not reporting any knee pain despite multiple falls on his left knee. He is receiving physical therapy which have improved his strength and stamina. [name] has been resumed for the last 3 weeks after being held for over 2 weeks.  He is reporting some improvement at this time. He continues to ambulate with assistance. His appetite has been excellent and drinks one boost a day.Has not reported any cough or hemoptysis or hematemesis. Does not report any nausea or vomiting or abdominal pain.Is not reporting any back pain or neurological symptoms. Does not report any headaches or blurry vision or double vision. Is not reporting any chest pain shortness of breath or difficulty breathing. Does  not report any constipation or diarrhea. Does not report any hematochezia or melena. Does not report any skin rashes or lesions. Rest of his review of system is unremarkable.  Medications: I have reviewed the patient's current medications.  Current Outpatient Prescriptions  Medication Sig Dispense Refill  . acetaminophen (TYLENOL) 500 MG tablet Take 500 mg by mouth 2 (two) times daily. Takes one in the AM and one qhs    . amLODipine (NORVASC) 5 MG tablet Take 1 tablet (5 mg total) by mouth daily. 90 tablet 1  . atorvastatin (LIPITOR) 10 MG tablet Take 1 tablet (10 mg total) by mouth daily. 90 tablet 11  . B Complex-C (B-COMPLEX WITH VITAMIN C) tablet Take 1 tablet by mouth daily.      . Denosumab (XGEVA [place]) Inject into the skin every 30 (thirty) days. monthly    . Docusate Calcium (STOOL SOFTENER PO) Take 1 capsule by mouth 2 (two) times daily.    . furosemide (LASIX) 40 MG tablet Take 1 tablet (40 mg total) by mouth daily. 90 tablet 3  . Leuprolide Acetate (LUPRON DEPOT IM) Inject 1 each into the muscle every 6 (six) months.     Marland Kitchen losartan (COZAAR) 100 MG tablet Take 1 tablet (100 mg total) by mouth daily. 30 tablet 2  . multivitamin (THERAGRAN) per tablet Take 1 tablet by mouth daily. ( with B - Complex )    . Omega-3 Fatty Acids (FISH OIL PO) Take 1 capsule by mouth daily. ( Mega Red )    . pantoprazole (PROTONIX) 40 MG  tablet Take 1 tablet (40 mg total) by mouth daily. While on steroids 30 tablet 3  . solifenacin (VESICARE) 5 MG tablet Take 5 mg by mouth daily.    Marland Kitchen warfarin (COUMADIN) 5 MG tablet Take 1 tablet (5 mg total) by mouth as directed. (Patient taking differently: Take 2.5-5 mg by mouth daily. 5mg  daily on Monday Wednesday and Friday, 2.5mg  daily all other days) 30 tablet 3  . XTANDI 40 MG capsule TAKE 2 CAPSULES BY MOUTH DAILY 60 capsule 0  . ZETIA 10 MG tablet TAKE 1 TABLET BY MOUTH ONCE DAILY 30 tablet 5   No current facility-administered medications for this visit.      Allergies:  Allergies  Allergen Reactions  . Pravachol     Muscle weakness  . Zocor [Simvastatin - High Dose]     Muscle weakness    Past Medical History, Surgical history, Social history, and Family History were reviewed and updated.   Physical Exam: Blood pressure 158/74, pulse 87, temperature 97.5 F (36.4 C), temperature source Oral, resp. rate 18, height 5\' 11"  (1.803 m), weight 200 lb 12.8 oz (91.082 kg). ECOG: 2 General appearance: alert, NAD. Head: Normocephalic, without obvious abnormality Neck: no adenopathy Lymph nodes: Cervical, supraclavicular, and axillary nodes normal. Heart:regular rate and rhythm, S1, S2 normal, no murmur, click, rub or gallop Lung:chest clear, no wheezing, rales, no dullness to percussion. Abdomin: soft, non-tender, without masses or organomegaly. Good bowel sounds. EXT: 1+ edema noted bilaterally. Neurological examination: No deficits noted.  Lab Results: Lab Results  Component Value Date   WBC 6.0 03/22/2014   HGB 12.8* 03/22/2014   HCT 40.3 03/22/2014   MCV 100.2* 03/22/2014   PLT 235 03/22/2014     Chemistry      Component Value Date/Time   NA 145 03/01/2014 1129   NA 140 02/19/2014 1445   K 5.3* 03/01/2014 1129   K 5.2* 02/19/2014 1445   CL 108 03/01/2014 1129   CO2 24 03/01/2014 1129   CO2 23 02/19/2014 1445   BUN 43* 03/01/2014 1129   BUN 52.0* 02/19/2014 1445   CREATININE 1.4 03/01/2014 1129   CREATININE 1.5* 02/19/2014 1445      Component Value Date/Time   CALCIUM 10.0 03/01/2014 1129   CALCIUM 9.0 02/19/2014 1445   ALKPHOS 60 02/19/2014 1445   ALKPHOS 62 02/09/2014 0308   AST 13 02/19/2014 1445   AST 14 02/09/2014 0308   ALT 20 02/19/2014 1445   ALT 13 02/09/2014 0308   BILITOT 0.71 02/19/2014 1445   BILITOT 0.8 02/09/2014 0308        Results for OLLIVANDER, SEE (MRN [id number]) as of 03/22/2014 10:33  Ref. Range 12/11/2013 12:45 01/16/2014 11:13 02/19/2014 14:24  PSA Latest Range: <=4.00  ng/mL 5.11 (H) 4.91 (H) 11.13 (H)       Impression and Plan:  81 year old gentleman with the following issues:   1. Castration resistant prostate cancer with metastatic disease to the bone. He is currently on reduced dose Xtandi with intermittent breaks and tolerating it well. His treatment has been on hold for the time being due to weakness and falls which resulted in increase in his PSA after 11.13 on 02/19/2014. His medication has been resumed for the last 3 weeks at least. His PSA is pending from today and if it drops down as expected, we will continue the current dose and schedule. If his PSA continues to rise consistently, we will consider his different salvage therapy.  2. Androgen depravation:  He is to continue Lupron at this time. He was recently given at [facility]  3. Bone directed therapy: I recommend him to continue Xgeva which she has been getting on a monthly basis. This is given at [facility].  4. Hypercalcemia: His calcium level is pending today but elevated on previous testing.  5. L4 and iliac bone metastasis: He is completed stereotactic body radiation without any complications at this time.  6. Followup: Will be in 4  weeks.  [facility], MD 1/29/201610:43 AM

## 2014-03-22 NOTE — Telephone Encounter (Signed)
New Msg        Pt wife calling, states cancer center did coumadin check on pt today so they will not be in to [facility] office.  Pt is weak, results will be sent from cancer center to Coumadin clinic.

## 2014-03-22 NOTE — Telephone Encounter (Signed)
gv and printed appt scehd and avs for pt for march

## 2014-03-25 ENCOUNTER — Encounter: Payer: Self-pay | Admitting: *Deleted

## 2014-03-25 ENCOUNTER — Ambulatory Visit (INDEPENDENT_AMBULATORY_CARE_PROVIDER_SITE_OTHER): Payer: Medicare Other | Admitting: Cardiovascular Disease

## 2014-03-25 ENCOUNTER — Telehealth: Payer: Self-pay | Admitting: *Deleted

## 2014-03-25 ENCOUNTER — Other Ambulatory Visit: Payer: Self-pay | Admitting: *Deleted

## 2014-03-25 DIAGNOSIS — I482 Chronic atrial fibrillation, unspecified: Secondary | ICD-10-CM

## 2014-03-25 DIAGNOSIS — Z5181 Encounter for therapeutic drug level monitoring: Secondary | ICD-10-CM

## 2014-03-25 NOTE — Telephone Encounter (Signed)
See encounter note of January 29th

## 2014-03-25 NOTE — Telephone Encounter (Signed)
Wife- Craig [name] calling stating " we would like the PSA results and I have a medication question "  Question regarding medication is " we discussed if he should continue on probiotics-  I misspoke and  meant Protonix "  " should he continue on Protonix ?"  Craig [name] return call number [phone]   Request return call for above information.  THIS NOTE WILL SENT TO MD AND RN AT DESK FOR FOLLOW UP AND RETURN CALL TO WIFE.

## 2014-03-27 ENCOUNTER — Telehealth: Payer: Self-pay | Admitting: *Deleted

## 2014-03-27 ENCOUNTER — Telehealth: Payer: Self-pay

## 2014-03-27 NOTE — Telephone Encounter (Signed)
Will check PSA with the next visit.

## 2014-03-27 NOTE — Telephone Encounter (Signed)
Pt had blood drawn on 03/22/14. The PSA was not done. LVM with [facility] Paganini asking if they have blood to run this test. If not the question is does Dr Alen Blew want pt to come in for another blood draw or to wait for next appt on 3/8. Will a PSA test now make any difference in his plan of care?

## 2014-03-27 NOTE — Telephone Encounter (Signed)
As noted by Dr. Alen Blew, PSA can be checked with next visit. Wife verbalized understanding.

## 2014-03-29 ENCOUNTER — Encounter: Payer: Self-pay | Admitting: Radiation Oncology

## 2014-04-01 ENCOUNTER — Telehealth: Payer: Self-pay | Admitting: *Deleted

## 2014-04-01 NOTE — Progress Notes (Signed)
   Radiation Oncology         [phone]) [phone] ________________________________  Name: [name] MRN: [id number]  Date: 02/27/2014  DOB: Jun 26, 1924  Stereotactic Body Radiotherapy Treatment Procedure Note   NARRATIVE: Brainard Highfill. was brought to the stereotactic radiation treatment machine and placed supine on the CT couch. The patient was set up for stereotactic body radiotherapy on the body fix device.   3D TREATMENT PLANNING AND DOSIMETRY: The patient's radiation plan was reviewed and approved prior to starting treatment. It showed 3-dimensional radiation distributions overlaid onto the planning CT. The [facility] for the target structures as well as the organs at risk were reviewed. The documentation of this is filed in the radiation oncology EMR.   SIMULATION VERIFICATION: The patient underwent CT imaging on the treatment unit. These were carefully aligned to document that the ablative radiation dose would cover the target volume and maximally spare the nearby organs at risk according to the planned distribution.   SPECIAL TREATMENT PROCEDURE: Ronal Maybury. received high dose ablative stereotactic body radiotherapy to the planned target volume without unforeseen complications. Treatment was delivered uneventfully. The high doses associated with stereotactic body radiotherapy and the significant potential risks require careful treatment set up and patient monitoring constituting a special treatment procedure.   STEREOTACTIC TREATMENT MANAGEMENT: Following delivery, the patient was evaluated clinically. The patient tolerated treatment without significant acute effects, and was discharged to home in stable condition.   Fraction: 5  Dose:  40 Gy (right iliac)  PLAN: follow-up in 1 month  ________________________________  Jodelle Gross, MD, PhD

## 2014-04-01 NOTE — Progress Notes (Signed)
   Radiation Oncology         [phone]) [phone] ________________________________  Name: Craig [name]. MRN: [id number]  Date: 02/13/2014  DOB: 11/15/1924  Stereotactic Body Radiotherapy Treatment Procedure Note   NARRATIVE: Craig [name]. was brought to the stereotactic radiation treatment machine and placed supine on the CT couch. The patient was set up for stereotactic body radiotherapy on the body fix device.   3D TREATMENT PLANNING AND DOSIMETRY: The patient's radiation plan was reviewed and approved prior to starting treatment. It showed 3-dimensional radiation distributions overlaid onto the planning CT. The [facility] for the target structures as well as the organs at risk were reviewed. The documentation of this is filed in the radiation oncology EMR.   SIMULATION VERIFICATION: The patient underwent CT imaging on the treatment unit. These were carefully aligned to document that the ablative radiation dose would cover the target volume and maximally spare the nearby organs at risk according to the planned distribution.   SPECIAL TREATMENT PROCEDURE: Craig [name]. received high dose ablative stereotactic body radiotherapy to the planned target volume without unforeseen complications. Treatment was delivered uneventfully. The high doses associated with stereotactic body radiotherapy and the significant potential risks require careful treatment set up and patient monitoring constituting a special treatment procedure.   STEREOTACTIC TREATMENT MANAGEMENT: Following delivery, the patient was evaluated clinically. The patient tolerated treatment without significant acute effects, and was discharged to home in stable condition.   Site: Right iliac:   Fraction: 1/  Dose:  8 Gy  (5 fractions total) L4 vertebral body:  Fraction 1/ Dose 18 Gy (1 fraction total)  PLAN: Continue treatment as planned for the right iliac target.   ________________________________  Jodelle Gross, MD,  PhD

## 2014-04-01 NOTE — Addendum Note (Signed)
Encounter addended by: Jodelle Gross, MD on: 04/01/2014 10:41 AM<BR>     Documentation filed: Notes Section, Visit Diagnoses

## 2014-04-01 NOTE — Progress Notes (Signed)
   Radiation Oncology         [phone]) [phone] ________________________________  Name: Craig [name]. MRN: [id number]  Date: 02/25/2014  DOB: May 07, 1924  Stereotactic Body Radiotherapy Treatment Procedure Note   NARRATIVE: Craig [name]. was brought to the stereotactic radiation treatment machine and placed supine on the CT couch. The patient was set up for stereotactic body radiotherapy on the body fix device.   3D TREATMENT PLANNING AND DOSIMETRY: The patient's radiation plan was reviewed and approved prior to starting treatment. It showed 3-dimensional radiation distributions overlaid onto the planning CT. The [facility] for the target structures as well as the organs at risk were reviewed. The documentation of this is filed in the radiation oncology EMR.   SIMULATION VERIFICATION: The patient underwent CT imaging on the treatment unit. These were carefully aligned to document that the ablative radiation dose would cover the target volume and maximally spare the nearby organs at risk according to the planned distribution.   SPECIAL TREATMENT PROCEDURE: Craig [name]. received high dose ablative stereotactic body radiotherapy to the planned target volume without unforeseen complications. Treatment was delivered uneventfully. The high doses associated with stereotactic body radiotherapy and the significant potential risks require careful treatment set up and patient monitoring constituting a special treatment procedure.   STEREOTACTIC TREATMENT MANAGEMENT: Following delivery, the patient was evaluated clinically. The patient tolerated treatment without significant acute effects, and was discharged to home in stable condition.   Fraction: 4  Dose:  32 Gy  PLAN: Continue treatment as planned to the right iliac target.   ________________________________  Jodelle Gross, MD, PhD

## 2014-04-01 NOTE — Addendum Note (Signed)
Encounter addended by: Jodelle Gross, MD on: 04/01/2014 10:38 AM<BR>     Documentation filed: Notes Section, Visit Diagnoses

## 2014-04-01 NOTE — Addendum Note (Signed)
Encounter addended by: Jodelle Gross, MD on: 04/01/2014 10:37 AM<BR>     Documentation filed: Notes Section

## 2014-04-01 NOTE — Telephone Encounter (Signed)
Wife Craig [name] calling to say she has stopped the xtandi. Note to dr Hazeline Junker desk

## 2014-04-01 NOTE — Progress Notes (Signed)
  Radiation Oncology         [phone]) [phone] ________________________________  Name: Craig [name]. MRN: [id number]  Date: 02/27/2014  DOB: [birth year]  End of Treatment Note  Diagnosis:   Metastatic prostate cancer with osseous metastasis     Indication for treatment:  palliaitve       Radiation treatment dates:   02/13/14 - 02/27/14  Site/dose:    1. L4 vertebral body. Radiosurgery delivered 18 Gy in 1 fraction to this target. 2.  Right iliac. Stereotactic body radiation treatment delivered 40 Gy in 5 fractions to this target.  Narrative: The patient tolerated radiation treatment relatively well.   No signs of acute toxicity/ GI toxicity.  Plan: The patient has completed radiation treatment. The patient will return to radiation oncology clinic for routine followup in one month. I advised the patient to call or return sooner if they have any questions or concerns related to their recovery or treatment. ________________________________  Jodelle Gross, M.D., Ph.D.

## 2014-04-02 ENCOUNTER — Telehealth: Payer: Self-pay | Admitting: *Deleted

## 2014-04-02 NOTE — Telephone Encounter (Signed)
Wife called asking about MRI order before his f/u appt tomorrow, I cannot find any order for MRI, per Dr. Lisbeth Renshaw last note SRS tx 02/27/14 jst saying f/u 1 month, wife stated she was distraught she has to hire someone to help bring him here , his legs are so weak she stopped the Xtandi last Thursday herself, informed her Dr. Lisbeth Renshaw is out  Of office ,apologized I couldn't help her any further but will speak with Dr. Lisbeth Renshaw tomorrow and will call her between 830-9am, wife stated make sure someone calls me " 4:28 PM

## 2014-04-03 ENCOUNTER — Ambulatory Visit: Admission: RE | Admit: 2014-04-03 | Payer: Medicare Other | Source: Ambulatory Visit | Admitting: Radiation Oncology

## 2014-04-03 ENCOUNTER — Telehealth: Payer: Self-pay | Admitting: Cardiology

## 2014-04-03 ENCOUNTER — Telehealth: Payer: Self-pay | Admitting: *Deleted

## 2014-04-03 HISTORY — DX: Personal history of irradiation: Z92.3

## 2014-04-03 NOTE — Telephone Encounter (Signed)
Spoke with wife and patient unable to come for INR tomorrow secondary to weakness.  Patient does not currently have nursing in home Will reschedule INR check for Friday when daughter able to help patient in

## 2014-04-03 NOTE — Telephone Encounter (Signed)
Called patient home, spoke with wife, cancelling today's follow up appt per Dr.Moody, no MRI is scheduled, to follow up in 2 weeks with Dr. Lisbeth Renshaw to see if weakness improves with being off [name], wife thanked MD and this Rn and transferred call to [place] Glad Dillingham to set up appt 8:10 AM

## 2014-04-03 NOTE — Telephone Encounter (Signed)
New Message  New Message. Pt is unable to get into the clinic. Unable to walk.Mercy Moore take more than 20 steps without hi legs growing weak. Requests a Home visit to draw coumadin. Please call.

## 2014-04-04 ENCOUNTER — Other Ambulatory Visit: Payer: Self-pay | Admitting: *Deleted

## 2014-04-04 ENCOUNTER — Telehealth: Payer: Self-pay | Admitting: Cardiology

## 2014-04-04 ENCOUNTER — Encounter: Payer: Self-pay | Admitting: *Deleted

## 2014-04-04 DIAGNOSIS — C7951 Secondary malignant neoplasm of bone: Principal | ICD-10-CM

## 2014-04-04 DIAGNOSIS — C61 Malignant neoplasm of prostate: Secondary | ICD-10-CM

## 2014-04-04 NOTE — Progress Notes (Signed)
Per patient's wife, hospital bed ordered and pyhsical therapy to be continued twice weekly. [facility], per dr Alen Blew. Wife notified.

## 2014-04-04 NOTE — Telephone Encounter (Signed)
Spoke with patients wife and she talked with PT today when they were they at the home Wife stated [facility] could obtain PT/INR while PT in the home Called and gave verbal order to [facility] for INR to be done at next visit  Advised patient  Kerin Perna aware

## 2014-04-04 NOTE — Telephone Encounter (Signed)
New Msg          Pt wife calling states he is now eligible for home health nurse from [place].  Pt may not be able to make coumadin check in office, wife doesn't want to cancel appt before speaking with nurse.  Please return wife call.

## 2014-04-05 ENCOUNTER — Encounter: Payer: Self-pay | Admitting: *Deleted

## 2014-04-09 LAB — POCT INR: INR: 1.6

## 2014-04-10 ENCOUNTER — Telehealth: Payer: Self-pay | Admitting: Cardiology

## 2014-04-10 ENCOUNTER — Ambulatory Visit (INDEPENDENT_AMBULATORY_CARE_PROVIDER_SITE_OTHER): Payer: Medicare Other | Admitting: Cardiovascular Disease

## 2014-04-10 DIAGNOSIS — I482 Chronic atrial fibrillation, unspecified: Secondary | ICD-10-CM

## 2014-04-10 DIAGNOSIS — Z5181 Encounter for therapeutic drug level monitoring: Secondary | ICD-10-CM

## 2014-04-10 NOTE — Telephone Encounter (Signed)
New message      Calling to see if [facility] gave you his pt/inr report from yesterday?

## 2014-04-10 NOTE — Telephone Encounter (Signed)
Spoke with wife and Janett Billow from [facility], see anticoagulant sheet from today.

## 2014-04-12 ENCOUNTER — Telehealth: Payer: Self-pay | Admitting: Cardiology

## 2014-04-12 NOTE — Telephone Encounter (Signed)
Left message to call back  

## 2014-04-12 NOTE — Telephone Encounter (Signed)
T/O given ok for nursing in the home [facility] per  Dr. Mare Ferrari

## 2014-04-12 NOTE — Telephone Encounter (Signed)
New Message  [facility] nurse called request verbal orders to conduct home health visits for nursing, safety and prevention. She would also like to take his PTINR's while she is there. She requests to conduct these visits for 4-5 weeks. Requests verbal orders to proceed.

## 2014-04-16 ENCOUNTER — Other Ambulatory Visit: Payer: Self-pay | Admitting: Cardiology

## 2014-04-16 ENCOUNTER — Encounter: Payer: Self-pay | Admitting: *Deleted

## 2014-04-18 ENCOUNTER — Ambulatory Visit (INDEPENDENT_AMBULATORY_CARE_PROVIDER_SITE_OTHER): Payer: Medicare Other | Admitting: Cardiovascular Disease

## 2014-04-18 ENCOUNTER — Emergency Department (HOSPITAL_COMMUNITY)
Admission: EM | Admit: 2014-04-18 | Discharge: 2014-04-18 | Disposition: A | Discharge status: Home / Self Care | Payer: Medicare Other | Attending: Emergency Medicine | Admitting: Emergency Medicine

## 2014-04-18 ENCOUNTER — Emergency Department (HOSPITAL_COMMUNITY): Payer: Medicare Other

## 2014-04-18 ENCOUNTER — Telehealth: Payer: Self-pay | Admitting: *Deleted

## 2014-04-18 ENCOUNTER — Encounter (HOSPITAL_COMMUNITY): Payer: Self-pay | Admitting: *Deleted

## 2014-04-18 DIAGNOSIS — I509 Heart failure, unspecified: Secondary | ICD-10-CM | POA: Insufficient documentation

## 2014-04-18 DIAGNOSIS — G8929 Other chronic pain: Secondary | ICD-10-CM | POA: Insufficient documentation

## 2014-04-18 DIAGNOSIS — E785 Hyperlipidemia, unspecified: Secondary | ICD-10-CM | POA: Insufficient documentation

## 2014-04-18 DIAGNOSIS — I482 Chronic atrial fibrillation, unspecified: Secondary | ICD-10-CM

## 2014-04-18 DIAGNOSIS — Z79899 Other long term (current) drug therapy: Secondary | ICD-10-CM | POA: Insufficient documentation

## 2014-04-18 DIAGNOSIS — Z5181 Encounter for therapeutic drug level monitoring: Secondary | ICD-10-CM

## 2014-04-18 DIAGNOSIS — R531 Weakness: Secondary | ICD-10-CM | POA: Insufficient documentation

## 2014-04-18 DIAGNOSIS — I10 Essential (primary) hypertension: Secondary | ICD-10-CM | POA: Insufficient documentation

## 2014-04-18 DIAGNOSIS — K219 Gastro-esophageal reflux disease without esophagitis: Secondary | ICD-10-CM | POA: Insufficient documentation

## 2014-04-18 DIAGNOSIS — Z8673 Personal history of transient ischemic attack (TIA), and cerebral infarction without residual deficits: Secondary | ICD-10-CM | POA: Insufficient documentation

## 2014-04-18 DIAGNOSIS — Z7901 Long term (current) use of anticoagulants: Secondary | ICD-10-CM | POA: Insufficient documentation

## 2014-04-18 DIAGNOSIS — Z923 Personal history of irradiation: Secondary | ICD-10-CM | POA: Insufficient documentation

## 2014-04-18 DIAGNOSIS — Z8701 Personal history of pneumonia (recurrent): Secondary | ICD-10-CM | POA: Insufficient documentation

## 2014-04-18 DIAGNOSIS — Z87891 Personal history of nicotine dependence: Secondary | ICD-10-CM | POA: Insufficient documentation

## 2014-04-18 DIAGNOSIS — M1990 Unspecified osteoarthritis, unspecified site: Secondary | ICD-10-CM | POA: Insufficient documentation

## 2014-04-18 DIAGNOSIS — Z8546 Personal history of malignant neoplasm of prostate: Secondary | ICD-10-CM | POA: Insufficient documentation

## 2014-04-18 DIAGNOSIS — Z85820 Personal history of malignant melanoma of skin: Secondary | ICD-10-CM | POA: Insufficient documentation

## 2014-04-18 DIAGNOSIS — I4891 Unspecified atrial fibrillation: Secondary | ICD-10-CM | POA: Insufficient documentation

## 2014-04-18 LAB — COMPREHENSIVE METABOLIC PANEL
ALBUMIN: 3 g/dL — AB (ref 3.5–5.2)
ALT: 10 U/L (ref 0–53)
ANION GAP: 6 (ref 5–15)
AST: 17 U/L (ref 0–37)
Alkaline Phosphatase: 55 U/L (ref 39–117)
BILIRUBIN TOTAL: 0.5 mg/dL (ref 0.3–1.2)
BUN: 32 mg/dL — AB (ref 6–23)
CALCIUM: 9.5 mg/dL (ref 8.4–10.5)
CO2: 28 mmol/L (ref 19–32)
Chloride: 108 mmol/L (ref 96–112)
Creatinine, Ser: 1.24 mg/dL (ref 0.50–1.35)
GFR calc Af Amer: 58 mL/min — ABNORMAL LOW (ref >90)
GFR calc non Af Amer: 50 mL/min — ABNORMAL LOW (ref >90)
Glucose, Bld: 113 mg/dL — ABNORMAL HIGH (ref 70–99)
Potassium: 3.8 mmol/L (ref 3.5–5.1)
Sodium: 142 mmol/L (ref 135–145)
TOTAL PROTEIN: 6.5 g/dL (ref 6.0–8.3)

## 2014-04-18 LAB — CBC WITH DIFFERENTIAL/PLATELET
BASOS PCT: 0 % (ref 0–1)
Basophils Absolute: 0 10*3/uL (ref 0.0–0.1)
EOS PCT: 7 % — AB (ref 0–5)
Eosinophils Absolute: 0.6 10*3/uL (ref 0.0–0.7)
HEMATOCRIT: 40.5 % (ref 39.0–52.0)
HEMOGLOBIN: 13.1 g/dL (ref 13.0–17.0)
Lymphocytes Relative: 19 % (ref 12–46)
Lymphs Abs: 1.8 10*3/uL (ref 0.7–4.0)
MCH: 32.6 pg (ref 26.0–34.0)
MCHC: 32.3 g/dL (ref 30.0–36.0)
MCV: 100.7 fL — ABNORMAL HIGH (ref 78.0–100.0)
MONO ABS: 0.8 10*3/uL (ref 0.1–1.0)
Monocytes Relative: 9 % (ref 3–12)
Neutro Abs: 5.9 10*3/uL (ref 1.7–7.7)
Neutrophils Relative %: 65 % (ref 43–77)
Platelets: 204 10*3/uL (ref 150–400)
RBC: 4.02 MIL/uL — ABNORMAL LOW (ref 4.22–5.81)
RDW: 14.8 % (ref 11.5–15.5)
WBC: 9.1 10*3/uL (ref 4.0–10.5)

## 2014-04-18 LAB — PROTIME-INR
INR: 1.97 — AB (ref 0.00–1.49)
PROTHROMBIN TIME: 22.6 s — AB (ref 11.6–15.2)

## 2014-04-18 LAB — URINALYSIS, ROUTINE W REFLEX MICROSCOPIC
Bilirubin Urine: NEGATIVE
Glucose, UA: NEGATIVE mg/dL
Hgb urine dipstick: NEGATIVE
Ketones, ur: NEGATIVE mg/dL
LEUKOCYTES UA: NEGATIVE
Nitrite: NEGATIVE
PROTEIN: NEGATIVE mg/dL
SPECIFIC GRAVITY, URINE: 1.014 (ref 1.005–1.030)
UROBILINOGEN UA: 0.2 mg/dL (ref 0.0–1.0)
pH: 6.5 (ref 5.0–8.0)

## 2014-04-18 LAB — I-STAT TROPONIN, ED: Troponin i, poc: 0 ng/mL (ref 0.00–0.08)

## 2014-04-18 MED ORDER — SODIUM CHLORIDE 0.9 % IV SOLN
INTRAVENOUS | Status: DC
  Administered 2014-04-18: 14:00:00 via INTRAVENOUS

## 2014-04-18 NOTE — Telephone Encounter (Signed)
Betty,wife, called and left voice message that patient may be d/c from the Emergency Dept if patient can ambulate in hall with a walker, but wants Dr. Lisbeth Renshaw to look at patient's xrays, labs,, all showed nothing new, and she doubts she cvan bring patient back for follow up tomorrow please have MD to  call her and advise, her cell# [phone],Home phone# [phone],JYN is aware of MD in another office till 4pm today 2:36 PM

## 2014-04-18 NOTE — Telephone Encounter (Signed)
[name] clled stating she was unable to get patient out of bed,his legs won't hold him up and I'm unable to get him here to see Dr.Moody tomorrow without calling an ambulance, ", encouraged patient to take him to the Er,ergency room via ambulance and I will inform Dr.Moody when he gets back from his other office at 4pm and will call to check patient's status, wife thanked me for listening 8:43 AM

## 2014-04-18 NOTE — ED Provider Notes (Signed)
TIME SEEN: 11:30 AM  CHIEF COMPLAINT: Generalized weakness, bilateral thigh pain  HPI: Pt is a 81 y.o. male with history of hypertension, atrial fibrillation on Coumadin, prostate cancer with bony metastases who is currently on oral chemotherapy (last dose February 4) and receiving localized radiation for his bony metastases (last in December 2015) who presents to the emergency department with generalized weakness for the past several weeks and an episode of bilateral thigh pain today that has resolved after taking Tylenol at home. Wife reports that he lives at home with her and she has his caregiver. She states that over the past few weeks he has had difficulty getting out of his hospital bed and ambulating with a walker. Today he was not able to get out of bed at all which is abnormal for him. She states normally he is able to walk 60-70 feet to the kitchen and bathroom using a walker, slowly but without significant difficulty. She states that he was unable to do this today.    He denies that he is had any recent fevers, chills, productive cough, vomiting or diarrhea, bloody stools or melena, numbness, tingling or focal weakness. Denies chest pain or shortness of breath.  Reports he has had some new back pain when sitting upright. The bilateral leg pain today was new for him. She states that he also had a fall 2 weeks ago where he was trying to get up off the toilet and did not have strength in his legs and sink down to the floor injuring his right foot. No head injury.  ROS: See HPI Constitutional: no fever  Eyes: no drainage  ENT: no runny nose   Cardiovascular:  no chest pain  Resp: no SOB  GI: no vomiting GU: no dysuria Integumentary: no rash  Allergy: no hives  Musculoskeletal: no leg swelling  Neurological: no slurred speech ROS otherwise negative  PAST MEDICAL HISTORY/PAST SURGICAL HISTORY:  Past Medical History  Diagnosis Date  . Hyperlipidemia   . Hypokalemia   . Hypertensive  heart disease   . Chronic back pain   . History of epistaxis 07/19/2002  . Personal history of long-term (current) use of anticoagulants   . ICH (intracerebral hemorrhage) ~ 1999    after TPA/notes 09/27/2012  . Hypertension   . Atrial fibrillation   . PAT (paroxysmal atrial tachycardia)   . Pneumonia     "once; several years ago" (09/27/2012)  . GERD (gastroesophageal reflux disease)   . H/O hiatal hernia   . Migraines     "migraines without headaches years ago" (09/27/2012)  . Stroke ~ 1999    ischemic / right cerebellar/posterior inferior cerebellar artery / right pos infarct  . Melanoma     "top of my head" (09/27/2012)  . Prostate cancer, primary, with metastasis from prostate to other site     on Lupron injections per GU  . Osteoarthritis of both feet   . S/P radiation therapy 02/13/14-02/27/14    SRS 5/5 spine completed 02/27/14    MEDICATIONS:  Prior to Admission medications   Medication Sig Start Date End Date Taking? Authorizing Provider  acetaminophen (TYLENOL) 500 MG tablet Take 500 mg by mouth 2 (two) times daily. Takes one in the AM and one qhs    Historical Provider, MD  amLODipine (NORVASC) 5 MG tablet Take 1 tablet (5 mg total) by mouth daily. 02/12/14 02/12/15  Ripudeep Krystal Eaton, MD  atorvastatin (LIPITOR) 10 MG tablet Take 1 tablet (10 mg total) by mouth daily.  09/12/13   [name], MD  B Complex-C (B-COMPLEX WITH VITAMIN C) tablet Take 1 tablet by mouth daily.      Historical Provider, MD  Denosumab (XGEVA [place]) Inject into the skin every 30 (thirty) days. monthly    Historical Provider, MD  Docusate Calcium (STOOL SOFTENER PO) Take 1 capsule by mouth 2 (two) times daily.    Historical Provider, MD  furosemide (LASIX) 40 MG tablet Take 1 tablet (40 mg total) by mouth daily. 03/01/14   [name], MD  Leuprolide Acetate (LUPRON DEPOT IM) Inject 1 each into the muscle every 6 (six) months.     Historical Provider, MD  losartan (COZAAR) 100 MG tablet Take 1 tablet (100  mg total) by mouth daily. 02/13/14   Reyne Dumas, MD  multivitamin [facility]) per tablet Take 1 tablet by mouth daily. ( with B - Complex )    Historical Provider, MD  Omega-3 Fatty Acids (FISH OIL PO) Take 1 capsule by mouth daily. ( Mega Red )    Historical Provider, MD  pantoprazole (PROTONIX) 40 MG tablet Take 1 tablet (40 mg total) by mouth daily. While on steroids 02/12/14   Ripudeep Krystal Eaton, MD  solifenacin (VESICARE) 5 MG tablet Take 5 mg by mouth daily.    Historical Provider, MD  warfarin (COUMADIN) 5 MG tablet Take 1 tablet (5 mg total) by mouth as directed. Patient taking differently: Take 2.5-5 mg by mouth daily. 5mg  daily on Monday Wednesday and Friday, 2.5mg  daily all other days 01/07/14   [name], MD  XTANDI 40 MG capsule TAKE 2 CAPSULES BY MOUTH DAILY 03/12/14   Wyatt Portela, MD  ZETIA 10 MG tablet TAKE 1 TABLET BY MOUTH ONCE DAILY 02/01/14   [name], MD    ALLERGIES:  Allergies  Allergen Reactions  . Pravachol     Muscle weakness  . Zocor [Simvastatin - High Dose]     Muscle weakness    SOCIAL HISTORY:  History  Substance Use Topics  . Smoking status: Former Smoker -- .5 years    Types: Cigarettes    Quit date: 02/22/1953  . Smokeless tobacco: Never Used  . Alcohol Use: Yes     Comment: 09/27/2012 "glass of wine or 2/month"    FAMILY HISTORY: Family History  Problem Relation Age of Onset  . Heart failure Mother   . Heart attack Father     EXAM: BP 171/88 mmHg  Pulse 76  Temp(Src) 97.5 F (36.4 C) (Oral)  Resp 20  SpO2 94% CONSTITUTIONAL: Alert and oriented and responds appropriately to questions. Well-appearing; well-nourished HEAD: Normocephalic EYES: Conjunctivae clear, PERRL ENT: normal nose; no rhinorrhea; moist mucous membranes; pharynx without lesions noted NECK: Supple, no meningismus, no LAD  CARD: RRR; S1 and S2 appreciated; no murmurs, no clicks, no rubs, no gallops RESP: Normal chest excursion without splinting or  tachypnea; breath sounds clear and equal bilaterally; no wheezes, no rhonchi, no rales, no hypoxia or respiratory distress ABD/GI: Normal bowel sounds; non-distended; soft, non-tender, no rebound, no guarding BACK:  The back appears normal and is non-tender to palpation, there is no CVA tenderness EXT: No bony tenderness over his bilateral thighs and no obvious swelling, patient does have some tenderness over the lateral dorsal aspect of the right foot without bony deformity, 2+ DP pulses bilaterally, no joint effusion, Normal ROM in all joints; otherwise extremities are non-tender to palpation; no edema; normal capillary refill; no cyanosis    SKIN: Normal color for age and  race; warm NEURO: Moves all extremities equally, sensation to light touch intact diffusely, cranial nerves II through XII intact PSYCH: The patient's mood and manner are appropriate. Grooming and personal hygiene are appropriate.  MEDICAL DECISION MAKING: Patient here with generalized weakness for the past several weeks.  We'll obtain labs, chest x-ray, urine to evaluate for organic causes for his generalized weakness. We'll also obtain x-rays of his lumbar spine, pelvis, bilateral femurs to evaluate for new metastatic lesions. We'll also obtain an x-ray of his right foot given recent injury with bony tenderness. Patient declines pain medicine at this time. He has no focal neurologic deficits. We'll give IV fluids.  ED PROGRESS: Patient's labs, urine are unremarkable. Reports feeling better. Still hemodynamically stable, neurologically intact. Imaging shows no acute abnormality. We will attempt to ambulate in the ED.    Patient was able to ambulate in the emergency department with a walker without any difficulty. Patient and wife are comfortable with plan to be discharged home. Discussed return precautions and supportive care instructions. They verbalize understanding and are comfortable with plan.    EKG  Interpretation  Date/Time:  Thursday April 18 2014 11:12:27 EST Ventricular Rate:  73 PR Interval:    QRS Duration: 98 QT Interval:  416 QTC Calculation: 458 R Axis:   -43 Text Interpretation:  Atrial fibrillation Left anterior fascicular block Abnormal R-wave progression, late transition Probable left ventricular hypertrophy Baseline wander in lead(s) III aVF V3 V5 No significant change since last tracing Confirmed by Deronte Solis,  DO, Enma Maeda ([id number]) on 04/18/2014 11:30:16 AM         [place], DO 04/18/14 1445

## 2014-04-18 NOTE — ED Notes (Signed)
Patient from home with c/o weakness.  Patient is typically somewhat weak d/t chemotherapy, but was exceptionally so this morning.  On exam, patient's lung sounds are clear in all fields.  Heart sounds WNL, HR irregular, S1/S2, no gallop or rub.  Patient's grips = bilat, CN III intact, 9mm.  Arcus sinillis noted bilaterally.  +1 pitting edema noted to bilateral LE.

## 2014-04-18 NOTE — Discharge Instructions (Signed)

## 2014-04-18 NOTE — ED Notes (Signed)
Nurse getting labs 

## 2014-04-18 NOTE — ED Notes (Signed)
Bed: WA06 Expected date:  Expected time:  Means of arrival:  Comments: EMS-weak 

## 2014-04-18 NOTE — ED Notes (Addendum)
Per EMS - patient comes from home where he lives with his wife.  Patient is being treated for prostate cancer with mets to bone.  Patient's last radiation treatment was in January and he is taking a chemotherapy pill.  Patient began having weakness in his legs this morning that is new.  He is ordinarily able to stand with assistance, this morning he is unable to do so.  Patient also had acute onset of leg pain in both legs (femur), which is new.  Patient took 1,000 mg of Tylenol and the pain is now gone.  Patient's vitals 164/84, HR 77 (a-fib), RR 20, 97% on RA.

## 2014-04-19 ENCOUNTER — Encounter (HOSPITAL_COMMUNITY): Payer: Self-pay | Admitting: Emergency Medicine

## 2014-04-19 ENCOUNTER — Encounter: Payer: Self-pay | Admitting: Radiation Oncology

## 2014-04-19 ENCOUNTER — Ambulatory Visit: Admission: RE | Admit: 2014-04-19 | Payer: Medicare Other | Source: Ambulatory Visit | Admitting: Radiation Oncology

## 2014-04-19 ENCOUNTER — Emergency Department (HOSPITAL_COMMUNITY): Payer: Medicare Other

## 2014-04-19 ENCOUNTER — Inpatient Hospital Stay (HOSPITAL_COMMUNITY)
Admission: EM | Admit: 2014-04-19 | Discharge: 2014-04-23 | DRG: 069 | Disposition: A | Discharge status: Home Health | Payer: Medicare Other | Attending: Family Medicine | Admitting: Family Medicine

## 2014-04-19 ENCOUNTER — Other Ambulatory Visit: Payer: Self-pay | Admitting: Radiation Oncology

## 2014-04-19 ENCOUNTER — Telehealth: Payer: Self-pay | Admitting: *Deleted

## 2014-04-19 DIAGNOSIS — I5032 Chronic diastolic (congestive) heart failure: Secondary | ICD-10-CM | POA: Diagnosis present

## 2014-04-19 DIAGNOSIS — I110 Hypertensive heart disease with heart failure: Secondary | ICD-10-CM | POA: Diagnosis present

## 2014-04-19 DIAGNOSIS — R310 Gross hematuria: Secondary | ICD-10-CM | POA: Diagnosis not present

## 2014-04-19 DIAGNOSIS — Z7901 Long term (current) use of anticoagulants: Secondary | ICD-10-CM

## 2014-04-19 DIAGNOSIS — C7951 Secondary malignant neoplasm of bone: Secondary | ICD-10-CM

## 2014-04-19 DIAGNOSIS — Z79899 Other long term (current) drug therapy: Secondary | ICD-10-CM

## 2014-04-19 DIAGNOSIS — I639 Cerebral infarction, unspecified: Secondary | ICD-10-CM | POA: Diagnosis present

## 2014-04-19 DIAGNOSIS — E785 Hyperlipidemia, unspecified: Secondary | ICD-10-CM | POA: Diagnosis present

## 2014-04-19 DIAGNOSIS — M549 Dorsalgia, unspecified: Secondary | ICD-10-CM | POA: Diagnosis present

## 2014-04-19 DIAGNOSIS — Z8673 Personal history of transient ischemic attack (TIA), and cerebral infarction without residual deficits: Secondary | ICD-10-CM

## 2014-04-19 DIAGNOSIS — I482 Chronic atrial fibrillation, unspecified: Secondary | ICD-10-CM | POA: Diagnosis present

## 2014-04-19 DIAGNOSIS — C7952 Secondary malignant neoplasm of bone marrow: Principal | ICD-10-CM

## 2014-04-19 DIAGNOSIS — G8929 Other chronic pain: Secondary | ICD-10-CM | POA: Diagnosis present

## 2014-04-19 DIAGNOSIS — N183 Chronic kidney disease, stage 3 unspecified: Secondary | ICD-10-CM | POA: Diagnosis present

## 2014-04-19 DIAGNOSIS — R4701 Aphasia: Secondary | ICD-10-CM | POA: Insufficient documentation

## 2014-04-19 DIAGNOSIS — Z87891 Personal history of nicotine dependence: Secondary | ICD-10-CM

## 2014-04-19 DIAGNOSIS — C61 Malignant neoplasm of prostate: Secondary | ICD-10-CM | POA: Diagnosis present

## 2014-04-19 DIAGNOSIS — Z923 Personal history of irradiation: Secondary | ICD-10-CM

## 2014-04-19 DIAGNOSIS — E860 Dehydration: Secondary | ICD-10-CM | POA: Diagnosis present

## 2014-04-19 DIAGNOSIS — E7800 Pure hypercholesterolemia, unspecified: Secondary | ICD-10-CM | POA: Diagnosis present

## 2014-04-19 DIAGNOSIS — G459 Transient cerebral ischemic attack, unspecified: Principal | ICD-10-CM | POA: Diagnosis present

## 2014-04-19 DIAGNOSIS — R471 Dysarthria and anarthria: Secondary | ICD-10-CM

## 2014-04-19 DIAGNOSIS — I10 Essential (primary) hypertension: Secondary | ICD-10-CM | POA: Diagnosis present

## 2014-04-19 DIAGNOSIS — Z85820 Personal history of malignant melanoma of skin: Secondary | ICD-10-CM

## 2014-04-19 DIAGNOSIS — N390 Urinary tract infection, site not specified: Secondary | ICD-10-CM | POA: Diagnosis not present

## 2014-04-19 DIAGNOSIS — N179 Acute kidney failure, unspecified: Secondary | ICD-10-CM | POA: Diagnosis not present

## 2014-04-19 DIAGNOSIS — K219 Gastro-esophageal reflux disease without esophagitis: Secondary | ICD-10-CM | POA: Diagnosis present

## 2014-04-19 DIAGNOSIS — R509 Fever, unspecified: Secondary | ICD-10-CM | POA: Diagnosis present

## 2014-04-19 LAB — CBC
HCT: 42.2 % (ref 39.0–52.0)
Hemoglobin: 13.7 g/dL (ref 13.0–17.0)
MCH: 31.9 pg (ref 26.0–34.0)
MCHC: 32.5 g/dL (ref 30.0–36.0)
MCV: 98.4 fL (ref 78.0–100.0)
PLATELETS: 200 10*3/uL (ref 150–400)
RBC: 4.29 MIL/uL (ref 4.22–5.81)
RDW: 14.7 % (ref 11.5–15.5)
WBC: 10 10*3/uL (ref 4.0–10.5)

## 2014-04-19 LAB — DIFFERENTIAL
BASOS ABS: 0.1 10*3/uL (ref 0.0–0.1)
BASOS PCT: 1 % (ref 0–1)
Eosinophils Absolute: 0.7 10*3/uL (ref 0.0–0.7)
Eosinophils Relative: 7 % — ABNORMAL HIGH (ref 0–5)
Lymphocytes Relative: 26 % (ref 12–46)
Lymphs Abs: 2.6 10*3/uL (ref 0.7–4.0)
Monocytes Absolute: 0.8 10*3/uL (ref 0.1–1.0)
Monocytes Relative: 8 % (ref 3–12)
NEUTROS ABS: 5.8 10*3/uL (ref 1.7–7.7)
Neutrophils Relative %: 58 % (ref 43–77)

## 2014-04-19 LAB — URINE MICROSCOPIC-ADD ON

## 2014-04-19 LAB — COMPREHENSIVE METABOLIC PANEL
ALK PHOS: 70 U/L (ref 39–117)
ALT: 13 U/L (ref 0–53)
AST: 22 U/L (ref 0–37)
Albumin: 3.2 g/dL — ABNORMAL LOW (ref 3.5–5.2)
Anion gap: 10 (ref 5–15)
BUN: 30 mg/dL — ABNORMAL HIGH (ref 6–23)
CALCIUM: 10 mg/dL (ref 8.4–10.5)
CHLORIDE: 103 mmol/L (ref 96–112)
CO2: 27 mmol/L (ref 19–32)
Creatinine, Ser: 1.42 mg/dL — ABNORMAL HIGH (ref 0.50–1.35)
GFR calc Af Amer: 50 mL/min — ABNORMAL LOW (ref >90)
GFR, EST NON AFRICAN AMERICAN: 43 mL/min — AB (ref >90)
Glucose, Bld: 123 mg/dL — ABNORMAL HIGH (ref 70–99)
Potassium: 4 mmol/L (ref 3.5–5.1)
SODIUM: 140 mmol/L (ref 135–145)
Total Bilirubin: 0.7 mg/dL (ref 0.3–1.2)
Total Protein: 7.1 g/dL (ref 6.0–8.3)

## 2014-04-19 LAB — URINALYSIS, ROUTINE W REFLEX MICROSCOPIC
BILIRUBIN URINE: NEGATIVE
GLUCOSE, UA: NEGATIVE mg/dL
KETONES UR: NEGATIVE mg/dL
Leukocytes, UA: NEGATIVE
Nitrite: NEGATIVE
PH: 6.5 (ref 5.0–8.0)
PROTEIN: NEGATIVE mg/dL
Specific Gravity, Urine: 1.013 (ref 1.005–1.030)
Urobilinogen, UA: 1 mg/dL (ref 0.0–1.0)

## 2014-04-19 LAB — I-STAT CHEM 8, ED
BUN: 33 mg/dL — AB (ref 6–23)
CHLORIDE: 103 mmol/L (ref 96–112)
Calcium, Ion: 1.19 mmol/L (ref 1.13–1.30)
Creatinine, Ser: 1.3 mg/dL (ref 0.50–1.35)
Glucose, Bld: 120 mg/dL — ABNORMAL HIGH (ref 70–99)
HCT: 46 % (ref 39.0–52.0)
Hemoglobin: 15.6 g/dL (ref 13.0–17.0)
POTASSIUM: 4 mmol/L (ref 3.5–5.1)
SODIUM: 142 mmol/L (ref 135–145)
TCO2: 24 mmol/L (ref 0–100)

## 2014-04-19 LAB — RAPID URINE DRUG SCREEN, HOSP PERFORMED
Amphetamines: NOT DETECTED
BARBITURATES: NOT DETECTED
Benzodiazepines: NOT DETECTED
COCAINE: NOT DETECTED
OPIATES: NOT DETECTED
Tetrahydrocannabinol: NOT DETECTED

## 2014-04-19 LAB — PROTIME-INR
INR: 1.84 — AB (ref 0.00–1.49)
Prothrombin Time: 21.5 seconds — ABNORMAL HIGH (ref 11.6–15.2)

## 2014-04-19 LAB — CBG MONITORING, ED: GLUCOSE-CAPILLARY: 123 mg/dL — AB (ref 70–99)

## 2014-04-19 LAB — I-STAT TROPONIN, ED: Troponin i, poc: 0.01 ng/mL (ref 0.00–0.08)

## 2014-04-19 LAB — ETHANOL

## 2014-04-19 LAB — APTT: aPTT: 38 seconds — ABNORMAL HIGH (ref 24–37)

## 2014-04-19 MED ORDER — ASPIRIN 300 MG RE SUPP: 300.0000 mg | Freq: Every day | RECTAL | Status: DC

## 2014-04-19 MED ORDER — LOSARTAN POTASSIUM 50 MG PO TABS
100.0000 mg | ORAL_TABLET | Freq: Every day | ORAL | Status: DC
Start: 2014-04-20 — End: 2014-04-23
  Administered 2014-04-20 – 2014-04-23 (×4): 100 mg via ORAL
  Filled 2014-04-19 (×4): qty 2

## 2014-04-19 MED ORDER — FUROSEMIDE 40 MG PO TABS
40.0000 mg | ORAL_TABLET | Freq: Every day | ORAL | Status: DC
  Administered 2014-04-20 – 2014-04-23 (×4): 40 mg via ORAL
  Filled 2014-04-19 (×4): qty 1

## 2014-04-19 MED ORDER — AMLODIPINE BESYLATE 5 MG PO TABS
5.0000 mg | ORAL_TABLET | Freq: Every day | ORAL | Status: DC
  Administered 2014-04-20 – 2014-04-22 (×3): 5 mg via ORAL
  Filled 2014-04-19 (×3): qty 1

## 2014-04-19 MED ORDER — SODIUM CHLORIDE 0.9 % IV SOLN
INTRAVENOUS | Status: AC
  Administered 2014-04-20: via INTRAVENOUS

## 2014-04-19 MED ORDER — STROKE: EARLY STAGES OF RECOVERY BOOK
Freq: Once | Status: AC
  Administered 2014-04-20

## 2014-04-19 MED ORDER — PANTOPRAZOLE SODIUM 40 MG PO TBEC
40.0000 mg | DELAYED_RELEASE_TABLET | Freq: Every day | ORAL | Status: DC
  Filled 2014-04-19: qty 1

## 2014-04-19 MED ORDER — WARFARIN SODIUM 5 MG PO TABS: 5.0000 mg | ORAL_TABLET | ORAL | Status: DC

## 2014-04-19 MED ORDER — EZETIMIBE 10 MG PO TABS
10.0000 mg | ORAL_TABLET | Freq: Every day | ORAL | Status: DC
  Administered 2014-04-20 – 2014-04-23 (×4): 10 mg via ORAL
  Filled 2014-04-19 (×4): qty 1

## 2014-04-19 MED ORDER — HYDRALAZINE HCL 20 MG/ML IJ SOLN
10.0000 mg | Freq: Once | INTRAMUSCULAR | Status: AC
  Administered 2014-04-20: 10 mg via INTRAVENOUS
  Filled 2014-04-19: qty 1

## 2014-04-19 MED ORDER — ASPIRIN 325 MG PO TABS
325.0000 mg | ORAL_TABLET | Freq: Every day | ORAL | Status: DC
  Administered 2014-04-20: 325 mg via ORAL
  Filled 2014-04-19: qty 1

## 2014-04-19 MED ORDER — WARFARIN - PHYSICIAN DOSING INPATIENT: Freq: Every day | Status: DC

## 2014-04-19 MED ORDER — OMEGA-3-ACID ETHYL ESTERS 1 G PO CAPS
1.0000 g | ORAL_CAPSULE | Freq: Every day | ORAL | Status: DC
  Administered 2014-04-20 – 2014-04-23 (×4): 1 g via ORAL
  Filled 2014-04-19 (×4): qty 1

## 2014-04-19 MED ORDER — ADULT MULTIVITAMIN W/MINERALS CH
1.0000 | ORAL_TABLET | Freq: Every day | ORAL | Status: DC
  Administered 2014-04-20 – 2014-04-23 (×4): 1 via ORAL
  Filled 2014-04-19 (×8): qty 1

## 2014-04-19 MED ORDER — HYDROMORPHONE HCL 1 MG/ML IJ SOLN: 1.0000 mg | Freq: Once | INTRAMUSCULAR | Status: DC

## 2014-04-19 MED ORDER — B COMPLEX-C PO TABS
1.0000 | ORAL_TABLET | Freq: Every day | ORAL | Status: DC
  Administered 2014-04-20 – 2014-04-23 (×4): 1 via ORAL
  Filled 2014-04-19 (×4): qty 1

## 2014-04-19 MED ORDER — WARFARIN SODIUM 5 MG PO TABS
2.5000 mg | ORAL_TABLET | ORAL | Status: DC
  Administered 2014-04-20: 2.5 mg via ORAL
  Filled 2014-04-19: qty 0.5

## 2014-04-19 MED ORDER — DARIFENACIN HYDROBROMIDE ER 7.5 MG PO TB24
7.5000 mg | ORAL_TABLET | Freq: Every day | ORAL | Status: DC
  Administered 2014-04-20 – 2014-04-22 (×3): 7.5 mg via ORAL
  Filled 2014-04-19 (×3): qty 1

## 2014-04-19 MED ORDER — DOCUSATE SODIUM 100 MG PO CAPS
100.0000 mg | ORAL_CAPSULE | Freq: Two times a day (BID) | ORAL | Status: DC
  Administered 2014-04-20 – 2014-04-23 (×7): 100 mg via ORAL
  Filled 2014-04-19 (×8): qty 1

## 2014-04-19 MED ORDER — WARFARIN SODIUM 5 MG PO TABS: 2.5000 mg | ORAL_TABLET | Freq: Every day | ORAL | Status: DC

## 2014-04-19 MED ORDER — ATORVASTATIN CALCIUM 10 MG PO TABS
10.0000 mg | ORAL_TABLET | Freq: Every day | ORAL | Status: DC
  Administered 2014-04-20 – 2014-04-23 (×4): 10 mg via ORAL
  Filled 2014-04-19 (×4): qty 1

## 2014-04-19 NOTE — Consult Note (Signed)
Referring Physician: ED    Chief Complaint: code stroke, aphasia  HPI:                                                                                                                                         Craig [name]. is an 81 y.o. male with a past medical history significant for hyperlipidemia, HTN, atrial fibrillation on coumadin, ischemic infarct s/p IV tpa complicated by ICH, migraines, prostate cancer metastatic to bone, brought in via EMS as a code stroke due to acute onset of transient language impairment. Wife is at the bedside and said that he was home siting in a recliner watching TV when she noticed that his speech was very garbled and he was little confused for a couple of minutes. She did not noticed any other changes but called EMS and reportedly patient had several episodes of transient language impairment since being evaluated by EMS. There is not reported HA, vertigo, double vision, focal arm/legs weakness, or visual disturbances. NIHSS 2. CT brain showed no acute abnormality. INR 1.84  Date last known well: 04/19/13 Time last known well: 3:45 tPA Given: no, out of the window, minimal deficits,previous post IV tpa ICH, coumadin with INR NIHSS: 2 MRS: 0  Past Medical History  Diagnosis Date  . Hyperlipidemia   . Hypokalemia   . Hypertensive heart disease   . Chronic back pain   . History of epistaxis 07/19/2002  . Personal history of long-term (current) use of anticoagulants   . ICH (intracerebral hemorrhage) ~ 1999    after TPA/notes 09/27/2012  . Hypertension   . Atrial fibrillation   . PAT (paroxysmal atrial tachycardia)   . Pneumonia     "once; several years ago" (09/27/2012)  . GERD (gastroesophageal reflux disease)   . H/O hiatal hernia   . Migraines     "migraines without headaches years ago" (09/27/2012)  . Stroke ~ 1999    ischemic / right cerebellar/posterior inferior cerebellar artery / right pos infarct  . Melanoma     "top of my head" (09/27/2012)   . Prostate cancer, primary, with metastasis from prostate to other site     on Lupron injections per GU  . Osteoarthritis of both feet   . S/P radiation therapy 02/13/14-02/27/14    SRS 5/5 spine completed 02/27/14    Past Surgical History  Procedure Laterality Date  . Nasal sinus surgery  1998  . Cataract extraction w/ intraocular lens  implant, bilateral Bilateral ~ 2011  . Skin graft Right 2010  . Melanoma excision  2010    "pre-melanoma on top of head; did skin graft from left thigh to cover" (09/27/2012)  . Tonsillectomy  ~ 1935    Family History  Problem Relation Age of Onset  . Heart failure Mother   . Heart attack Father    Social History:  reports that he quit smoking  about 61 years ago. His smoking use included Cigarettes. He quit after .5 years of use. He has never used smokeless tobacco. He reports that he drinks alcohol. He reports that he does not use illicit drugs.  Allergies:  Allergies  Allergen Reactions  . Pravachol     Muscle weakness  . Zocor [Simvastatin - High Dose]     Muscle weakness    Medications:                                                                                                                           I have reviewed the patient's current medications.  ROS:                                                                                                                                       History obtained from wife and chart review  General ROS: negative for - chills, fatigue, fever, night sweats, weight gain Psychological ROS: negative for - behavioral disorder, hallucinations, memory difficulties, mood swings or suicidal ideation Ophthalmic ROS: negative for - blurry vision, double vision, eye pain or loss of vision ENT ROS: negative for - epistaxis, nasal discharge, oral lesions, sore throat, tinnitus or vertigo Allergy and Immunology ROS: negative for - hives or itchy/watery eyes Hematological and Lymphatic ROS: negative for -  bleeding problems, bruising or swollen lymph nodes Endocrine ROS: negative for - galactorrhea, hair pattern changes, polydipsia/polyuria or temperature intolerance Respiratory ROS: negative for - cough, hemoptysis, shortness of breath or wheezing Cardiovascular ROS: negative for - chest pain, dyspnea on exertion, edema or irregular heartbeat Gastrointestinal ROS: negative for - abdominal pain, diarrhea, hematemesis, nausea/vomiting or stool incontinence Genito-Urinary ROS: negative for - dysuria Musculoskeletal ROS: negative for - joint swelling or muscular weakness Neurological ROS: as noted in HPI Dermatological ROS: negative for rash and skin lesion changes  Physical exam: pleasant male in no apparent distress. Blood pressure 192/76, pulse 94, temperature 97.8 F (36.6 C), temperature source Oral, resp. rate 16, height 6' (1.829 m), weight 88.451 kg (195 lb), SpO2 100 %. Head: normocephalic. Neck: supple, no bruits, no JVD. Cardiac: no murmurs. Lungs: clear. Abdomen: soft, no tender, no mass. Extremities: no edema. Skin: no rash Neurologic Examination:  General: Mental Status: Alert, oriented, thought content appropriate.  Speech fluent with evidence of mild expressive aphasia.  Able to follow 3 step commands without difficulty. Cranial Nerves: II: Discs flat bilaterally; Visual fields grossly normal, pupils equal, round, reactive to light and accommodation III,IV, VI: ptosis not present, extra-ocular motions intact bilaterally V,VII: smile asymmetric due to mild right face weakness, facial light touch sensation normal bilaterally VIII: hearing normal bilaterally IX,X: gag reflex present XI: bilateral shoulder shrug XII: midline tongue extension without atrophy or fasciculations  Motor: Right : Upper extremity   5/5    Left:     Upper extremity   5/5  Lower extremity    5/5     Lower extremity   5/5 Tone and bulk:normal tone throughout; no atrophy noted Sensory: Pinprick and light touch intact throughout, bilaterally Deep Tendon Reflexes:  Right: Upper Extremity   Left: Upper extremity   biceps (C-5 to C-6) 2/4   biceps (C-5 to C-6) 2/4 tricep (C7) 2/4    triceps (C7) 2/4 Brachioradialis (C6) 2/4  Brachioradialis (C6) 2/4  Lower Extremity Lower Extremity  quadriceps (L-2 to L-4) 2/4   quadriceps (L-2 to L-4) 2/4 Achilles (S1) 2/4   Achilles (S1) 2/4  Plantars: Right: downgoing   Left: downgoing Cerebellar: normal finger-to-nose,  normal heel-to-shin test Gait:  Deferred for safety reasons    Results for orders placed or performed during the hospital encounter of 04/19/14 (from the past 48 hour(s))  I-Stat Troponin, ED (not at [facility])     Status: None   Collection Time: 04/19/14  7:46 PM  Result Value Ref Range   Troponin i, poc 0.01 0.00 - 0.08 ng/mL   Comment 3            Comment: Due to the release kinetics of cTnI, a negative result within the first hours of the onset of symptoms does not rule out myocardial infarction with certainty. If myocardial infarction is still suspected, repeat the test at appropriate intervals.   I-Stat Chem 8, ED     Status: Abnormal   Collection Time: 04/19/14  7:47 PM  Result Value Ref Range   Sodium 142 135 - 145 mmol/L   Potassium 4.0 3.5 - 5.1 mmol/L   Chloride 103 96 - 112 mmol/L   BUN 33 (H) 6 - 23 mg/dL   Creatinine, Ser 1.30 0.50 - 1.35 mg/dL   Glucose, Bld 120 (H) 70 - 99 mg/dL   Calcium, Ion 1.19 1.13 - 1.30 mmol/L   TCO2 24 0 - 100 mmol/L   Hemoglobin 15.6 13.0 - 17.0 g/dL   HCT 46.0 39.0 - 52.0 %   Dg Chest 2 View  04/18/2014   CLINICAL DATA:  Generalized weakness, metastatic prostate cancer  EXAM: CHEST  2 VIEW  COMPARISON:  04/12/2013  FINDINGS: Cardiomediastinal silhouette is stable. No acute infiltrate or pulmonary edema. Again noted atelectasis or scarring in left base. Stable  compression fracture of of T6 vertebral body. There is mild compression deformity and prior vertebroplasty at T11 level. Stable.  IMPRESSION: No acute infiltrate or pulmonary edema. Stable left basilar atelectasis or scarring. Compression deformities thoracic spine as described above.   Electronically Signed   By: Lahoma Crocker M.D.   On: 04/18/2014 13:06   Dg Lumbar Spine Complete  04/18/2014   CLINICAL DATA:  Bilateral lower extremity weakness.  EXAM: LUMBAR SPINE - COMPLETE 4+ VIEW  COMPARISON:  None.  FINDINGS: No fracture or spondylolisthesis is noted. Status post T11 kyphoplasty. Mild  degenerative disc disease is noted at L2-3, L3-4, L4-5 and L5-S1. Atherosclerotic calcifications of abdominal aorta are noted.  IMPRESSION: Mild multilevel degenerative disc disease is noted. No acute abnormality seen in the lumbar spine.   Electronically Signed   By: Marijo Conception, M.D.   On: 04/18/2014 13:11   Dg Pelvis 1-2 Views  04/18/2014   CLINICAL DATA:  Generalized weakness, metastatic prostate cancer, chemotherapy and radiation therapy  EXAM: PELVIS - 1-2 VIEW  COMPARISON:  None  FINDINGS: No pelvic fracture is noted. Stable sclerotic focus in left femoral head and left proximal femur without change from prior exam. Mild patchy sclerosis bilateral iliac bones and pubic rami, stable from prior exam. This may be due to treated metastatic disease. No destructive bony lesion is noted.  IMPRESSION: No acute fracture or subluxation. Stable sclerotic foci as described above.   Electronically Signed   By: Lahoma Crocker M.D.   On: 04/18/2014 13:11   Dg Foot Complete Right  04/18/2014   CLINICAL DATA:  Generalized weakness, prostate cancer metastatic to bone being tree with radiation and chemotherapy, weakness in legs beginning this morning, unable to stand, acute onset of BILATERAL leg pain at the femora, generalized new RIGHT foot pain  EXAM: RIGHT FOOT COMPLETE - 3+ VIEW  COMPARISON:  None  FINDINGS: Mild osseous  demineralization.  Joint spaces preserved.  Small plantar calcaneal spur.  Small sclerotic focus within the mid tarsal navicular favoring bone island at this site.  No acute fracture, dislocation or bone destruction.  IMPRESSION: No definite acute osseous abnormalities.  Calcaneal spurring.  Probable bone island within tarsal navicular.   Electronically Signed   By: Lavonia Dana M.D.   On: 04/18/2014 13:12   Dg Femur Min 2 Views Left  04/18/2014   CLINICAL DATA:  Generalized weakness, bilateral leg pain, metastatic prostate cancer  EXAM: LEFT FEMUR 2 VIEWS  COMPARISON:  02/09/2014  FINDINGS: Four views of the left femur submitted. No acute fracture or subluxation. No destructive bony lesion. Stable sclerotic focus in left femoral head and left proximal femur without change from prior exam. This may be due to bone islands or treated metastasis.  IMPRESSION: Negative.   Electronically Signed   By: Lahoma Crocker M.D.   On: 04/18/2014 13:13   Dg Femur, Min 2 Views Right  04/18/2014   CLINICAL DATA:  Acute bilateral femur pain.  EXAM: RIGHT FEMUR 2 VIEWS  COMPARISON:  None.  FINDINGS: There is no evidence of fracture or other focal bone lesions. Soft tissues are unremarkable.  IMPRESSION: Normal right femur.   Electronically Signed   By: Marijo Conception, M.D.   On: 04/18/2014 13:08    Assessment: 81 y.o. male brought in with recurrent episodes of transient expressive dysphasia. NIHSS 2, CT brain without acute abnormality. Tpa was not administered fundamentally because patient was out of the window for tpa but also patient on coumadin with INR 1.84, and previous post IV tpa ICH. His deficits are mild, so no a candidate for endovascular intervention. Left brain TIA versus small cortical infarct involving distal branch left MCA. Admit to medicine and complete stroke work up. Sub-therapeutic INR, adjusted as per medicine. Stroke team will follow up in the morning.  Stroke Risk Factors - age, hyperlipidemia, HTN,  atrial fibrillation, prior stroke  Plan: 1. HgbA1c, fasting lipid panel 2. MRI, MRA  of the brain without contrast 3. Echocardiogram 4. Carotid dopplers 5. Prophylactic therapy-coumadin 6. Risk factor modification 7. Telemetry monitoring 8.  Frequent neuro checks 9. PT/OT SLP   Dorian Pod, MD [facility] [phone]  04/19/2014, 7:58 PM

## 2014-04-19 NOTE — Code Documentation (Signed)
[name] is an 81yo wm presenting to the [facility] via [facility] for expressive aphasia that began, per his wife, around 1.  He had several episodes where his speech improved but never returned to baseline.  He has a hx of prostate cancer with mets to his bone, A.fib on coumadin INR 1.9 on 2/25), and a recent visit to [facility] for Lt femur pain r/t bone mets.  Pt is NIH 4 for expressive aphasia & mild facial droop.  No acute treatment at this time.

## 2014-04-19 NOTE — ED Notes (Signed)
Per ems Pt has had multiple episodes of expressive aphasia that started around 1500 speech got better and at 1915 with EMS pt became aphasic. On arrival to ED pt is alert and ox3. Following commands.

## 2014-04-19 NOTE — Progress Notes (Signed)
Auth for both MRI Pelvis ([id number]) & MRI lumbar ([id number]) approved through [facility] online; valid 04/19/14-06/03/2014. (lw)

## 2014-04-19 NOTE — H&P (Signed)
[facility] Admission History and Physical       [name] YQM:[id number] DOB: June 04, 1924 DOA: 04/19/2014  Referring physician:  PCP: [name], MD  Specialists:   Chief Complaint:   HPI: Craig [name]. is a 81 y.o. male with a history of a previous CVA/ICH in 2002, TIAs, Atrial Fibrillation on Coumadin Rx, HTN, Hyperlipidemia and Prostate Cancer with Metastatic Disease to the Bone who presents to the ED with complaints of diffculty speaking which occurred around 3:45 pm.   His wife had noticed his garbled speech at that time.    His  Wife reports that the symptoms resolved but then returned at  5 pm and then the symptoms were not resolving.  SHe thought he may have been having a TIA.  He was brought to the ED in the next hour when his symptoms did not appear to be improving.      Review of Systems:  Constitutional: No Weight Loss, No Weight Gain, Night Sweats, Fevers, Chills, Dizziness, Light Headedness, Fatigue, or Generalized Weakness HEENT: No Headaches, Difficulty Swallowing,Tooth/Dental Problems,Sore Throat,  No Sneezing, Rhinitis, Ear Ache, Nasal Congestion, or Post Nasal Drip,  Cardio-vascular:  No Chest pain, Orthopnea, PND, Edema in Lower Extremities, Anasarca, Dizziness, Palpitations  Resp: No Dyspnea, No DOE, No Productive Cough, No Non-Productive Cough, No Hemoptysis, No Wheezing.    GI: No Heartburn, Indigestion, Abdominal Pain, Nausea, Vomiting, Diarrhea, Constipation, Hematemesis, Hematochezia, Melena, Change in Bowel Habits,  Loss of Appetite  GU: No Dysuria, No Change in Color of Urine, No Urgency or Urinary Frequency, No Flank pain.  Musculoskeletal: No Joint Pain or Swelling, No Decreased Range of Motion, No Back Pain.  Neurologic: No Syncope, No Seizures, +Muscle Weakness, Paresthesia, Vision Disturbance or Loss, No Diplopia, +Expressive Aphasia,  No Vertigo, +Difficulty Walking (Chronic),  Skin: No Rash or Lesions. Psych: No Change in  Mood or Affect, No Depression or Anxiety, No Memory loss, No Confusion, or Hallucinations   Past Medical History  Diagnosis Date  . Hyperlipidemia   . Hypokalemia   . Hypertensive heart disease   . Chronic back pain   . History of epistaxis 07/19/2002  . Personal history of long-term (current) use of anticoagulants   . ICH (intracerebral hemorrhage) ~ 1999    after TPA/notes 09/27/2012  . Hypertension   . Atrial fibrillation   . PAT (paroxysmal atrial tachycardia)   . Pneumonia     "once; several years ago" (09/27/2012)  . GERD (gastroesophageal reflux disease)   . H/O hiatal hernia   . Migraines     "migraines without headaches years ago" (09/27/2012)  . Stroke ~ 1999    ischemic / right cerebellar/posterior inferior cerebellar artery / right pos infarct  . Melanoma     "top of my head" (09/27/2012)  . Prostate cancer, primary, with metastasis from prostate to other site     on Lupron injections per GU  . Osteoarthritis of both feet   . S/P radiation therapy 02/13/14-02/27/14    SRS 5/5 spine completed 02/27/14     Past Surgical History  Procedure Laterality Date  . Nasal sinus surgery  1998  . Cataract extraction w/ intraocular lens  implant, bilateral Bilateral ~ 2011  . Skin graft Right 2010  . Melanoma excision  2010    "pre-melanoma on top of head; did skin graft from left thigh to cover" (09/27/2012)  . Tonsillectomy  ~ 1935      Prior to Admission medications   Medication  Sig Start Date End Date Taking? Authorizing Provider  acetaminophen (TYLENOL) 500 MG tablet Take 500 mg by mouth 2 (two) times daily. Takes one in the AM and one qhs   Yes Historical Provider, MD  amLODipine (NORVASC) 5 MG tablet Take 1 tablet (5 mg total) by mouth daily. 02/12/14 02/12/15 Yes Ripudeep Krystal Eaton, MD  atorvastatin (LIPITOR) 10 MG tablet Take 1 tablet (10 mg total) by mouth daily. 09/12/13  Yes [name], MD  B Complex-C (B-COMPLEX WITH VITAMIN C) tablet Take 1 tablet by mouth daily.      Yes Historical Provider, MD  Denosumab (XGEVA [place]) Inject into the skin every 30 (thirty) days. monthly   Yes Historical Provider, MD  Docusate Calcium (STOOL SOFTENER PO) Take 1 capsule by mouth 2 (two) times daily.   Yes Historical Provider, MD  furosemide (LASIX) 40 MG tablet Take 1 tablet (40 mg total) by mouth daily. 03/01/14  Yes [name], MD  Leuprolide Acetate (LUPRON DEPOT IM) Inject 1 each into the muscle every 6 (six) months.    Yes Historical Provider, MD  losartan (COZAAR) 100 MG tablet Take 1 tablet (100 mg total) by mouth daily. 02/13/14  Yes Reyne Dumas, MD  multivitamin [facility]) per tablet Take 1 tablet by mouth daily. ( with B - Complex )   Yes Historical Provider, MD  Omega-3 Fatty Acids (FISH OIL PO) Take 1 capsule by mouth daily. ( Mega Red )   Yes Historical Provider, MD  pantoprazole (PROTONIX) 40 MG tablet Take 1 tablet (40 mg total) by mouth daily. While on steroids 02/12/14  Yes Ripudeep K Rai, MD  solifenacin (VESICARE) 5 MG tablet Take 5 mg by mouth daily.   Yes Historical Provider, MD  warfarin (COUMADIN) 5 MG tablet Take 1 tablet (5 mg total) by mouth as directed. Patient taking differently: Take 2.5-5 mg by mouth daily. 5mg  daily on Monday and  Wednesday, 2.5mg  daily all other days 01/07/14  Yes [name], MD  XTANDI 40 MG capsule TAKE 2 CAPSULES BY MOUTH DAILY 03/12/14  Yes Wyatt Portela, MD  ZETIA 10 MG tablet TAKE 1 TABLET BY MOUTH ONCE DAILY 02/01/14  Yes [name], MD     Allergies  Allergen Reactions  . Pravachol     Muscle weakness  . Zocor [Simvastatin - High Dose]     Muscle weakness    Social History:    reports that he quit smoking about 61 years ago. His smoking use included Cigarettes. He quit after .5 years of use. He has never used smokeless tobacco. He reports that he drinks alcohol. He reports that he does not use illicit drugs.    Family History  Problem Relation Age of Onset  . Heart failure Mother   . Heart  attack Father        Physical Exam:  GEN:  Pleasant Elderly Well Nourished and Well Developed  81 y.o. Caucasian male examined and in no acute distress; cooperative with exam Filed Vitals:   04/19/14 2045 04/19/14 2100 04/19/14 2114 04/19/14 2115  BP: 168/100 174/81  188/94  Pulse: 97 64  95  Temp:   97.8 F (36.6 C)   TempSrc:      Resp: 21 15  17   Height:      Weight:      SpO2: 96% 96%  98%   Blood pressure 188/94, pulse 95, temperature 97.8 F (36.6 C), temperature source Oral, resp. rate 17, height 6' (1.829 m), weight 88.451 kg (  195 lb), SpO2 98 %. PSYCH: He is alert and oriented x3; does not appear anxious does not appear depressed; affect is normal HEENT: Normocephalic and Atraumatic, Mucous membranes pink; PERRLA; EOM intact; Fundi:  Benign;  No scleral icterus, Nares: Patent, Oropharynx: Clear, Fair Dentition,    Neck:  FROM, No Cervical Lymphadenopathy nor Thyromegaly or Carotid Bruit; No JVD; Breasts:: Not examined CHEST WALL: No tenderness CHEST: Normal respiration, clear to auscultation bilaterally HEART: Iregular rate and rhythm; no murmurs rubs or gallops BACK: No kyphosis or scoliosis; No CVA tenderness ABDOMEN: Positive Bowel Sounds, Soft Non-Tender, No Rebound or Guarding; No Masses, No Organomegaly. Rectal Exam: Not done EXTREMITIES: No Cyanosis, Clubbing, or Edema; No Ulcerations. Genitalia: not examined PULSES: 2+ and symmetric SKIN: Normal hydration no rash or ulceration  CNS:  Mildly confused. Alert and Oriented x3 Mental Status:  Alert, Oriented, Thought Content Appropriate. Speech Fluent with Hesitance, Able to follow 3 step commands without difficulty.  In No obvious pain.   Cranial Nerves:  II: Discs flat bilaterally; Visual fields Intact. Pupils equal and reactive.    III,IV, VI: Extra-ocular motions intact bilaterally    V,VII: smile symmetric, facial light touch sensation normal bilaterally    VIII: hearing intact bilaterally    IX,X: gag  reflex present    XI: bilateral shoulder shrug    XII: midline tongue extension   Motor:  Right:  Upper extremity 5/5     Left:  Upper extremity 5/5     Right:  Lower extremity 4/5    Left:  Lower extremity 4/5     Tone and Bulk:  normal tone throughout; no atrophy noted   Sensory:  Pinprick and light touch intact throughout, bilaterally   Deep Tendon Reflexes: 2+ and symmetric throughout   Plantars/ Babinski:  Right: normal Left: normal    Cerebellar:  Finger to nose with difficulty.   Gait: deferred    Vascular: pulses palpable throughout    Labs on Admission:  Basic Metabolic Panel:  Recent Labs Lab 04/18/14 1208 04/19/14 1942 04/19/14 1947  NA 142 140 142  K 3.8 4.0 4.0  CL 108 103 103  CO2 28 27  --   GLUCOSE 113* 123* 120*  BUN 32* 30* 33*  CREATININE 1.24 1.42* 1.30  CALCIUM 9.5 10.0  --    Liver Function Tests:  Recent Labs Lab 04/18/14 1208 04/19/14 1942  AST 17 22  ALT 10 13  ALKPHOS 55 70  BILITOT 0.5 0.7  PROT 6.5 7.1  ALBUMIN 3.0* 3.2*   No results for input(s): LIPASE, AMYLASE in the last 168 hours. No results for input(s): AMMONIA in the last 168 hours. CBC:  Recent Labs Lab 04/18/14 1208 04/19/14 1942 04/19/14 1947  WBC 9.1 10.0  --   NEUTROABS 5.9 5.8  --   HGB 13.1 13.7 15.6  HCT 40.5 42.2 46.0  MCV 100.7* 98.4  --   PLT 204 200  --    Cardiac Enzymes: No results for input(s): CKTOTAL, CKMB, CKMBINDEX, TROPONINI in the last 168 hours.  BNP (last 3 results) No results for input(s): BNP in the last 8760 hours.  ProBNP (last 3 results)  Recent Labs  02/08/14 1148  PROBNP 6362.0*    CBG:  Recent Labs Lab 04/19/14 1957  GLUCAP 123*    Radiological Exams on Admission: Dg Chest 2 View  04/18/2014   CLINICAL DATA:  Generalized weakness, metastatic prostate cancer  EXAM: CHEST  2 VIEW  COMPARISON:  04/12/2013  FINDINGS:  Cardiomediastinal silhouette is stable. No acute infiltrate or pulmonary edema. Again noted  atelectasis or scarring in left base. Stable compression fracture of of T6 vertebral body. There is mild compression deformity and prior vertebroplasty at T11 level. Stable.  IMPRESSION: No acute infiltrate or pulmonary edema. Stable left basilar atelectasis or scarring. Compression deformities thoracic spine as described above.   Electronically Signed   By: Lahoma Crocker M.D.   On: 04/18/2014 13:06   Dg Lumbar Spine Complete  04/18/2014   CLINICAL DATA:  Bilateral lower extremity weakness.  EXAM: LUMBAR SPINE - COMPLETE 4+ VIEW  COMPARISON:  None.  FINDINGS: No fracture or spondylolisthesis is noted. Status post T11 kyphoplasty. Mild degenerative disc disease is noted at L2-3, L3-4, L4-5 and L5-S1. Atherosclerotic calcifications of abdominal aorta are noted.  IMPRESSION: Mild multilevel degenerative disc disease is noted. No acute abnormality seen in the lumbar spine.   Electronically Signed   By: Marijo Conception, M.D.   On: 04/18/2014 13:11   Dg Pelvis 1-2 Views  04/18/2014   CLINICAL DATA:  Generalized weakness, metastatic prostate cancer, chemotherapy and radiation therapy  EXAM: PELVIS - 1-2 VIEW  COMPARISON:  None  FINDINGS: No pelvic fracture is noted. Stable sclerotic focus in left femoral head and left proximal femur without change from prior exam. Mild patchy sclerosis bilateral iliac bones and pubic rami, stable from prior exam. This may be due to treated metastatic disease. No destructive bony lesion is noted.  IMPRESSION: No acute fracture or subluxation. Stable sclerotic foci as described above.   Electronically Signed   By: Lahoma Crocker M.D.   On: 04/18/2014 13:11   Ct Head Wo Contrast  04/19/2014   CLINICAL DATA:  Aphasia.  Last known well at 16:00  EXAM: CT HEAD WITHOUT CONTRAST  TECHNIQUE: Contiguous axial images were obtained from the base of the skull through the vertex without intravenous contrast.  COMPARISON:  Brain MRI 02/09/2014  FINDINGS: Remote infarction in the right cerebellum.  No  acute intracranial hemorrhage. No focal mass lesion. No CT evidence of acute infarction. No midline shift or mass effect. No hydrocephalus. Basilar cisterns are patent.  Extensive cortical atrophy. Periventricular white matter hypodensities.  Paranasal sinuses and  mastoid air cells are clear.  IMPRESSION: 1. No CT evidence of acute infarction. 2. No intracranial hemorrhage. 3. Remote right cerebellar infarction. 4. Extensive atrophy and white matter microvascular disease. Findings conveyed toCamilo, MDon 04/19/2014  at19:55.   Electronically Signed   By: Suzy Bouchard M.D.   On: 04/19/2014 19:57   Dg Foot Complete Right  04/18/2014   CLINICAL DATA:  Generalized weakness, prostate cancer metastatic to bone being tree with radiation and chemotherapy, weakness in legs beginning this morning, unable to stand, acute onset of BILATERAL leg pain at the femora, generalized new RIGHT foot pain  EXAM: RIGHT FOOT COMPLETE - 3+ VIEW  COMPARISON:  None  FINDINGS: Mild osseous demineralization.  Joint spaces preserved.  Small plantar calcaneal spur.  Small sclerotic focus within the mid tarsal navicular favoring bone island at this site.  No acute fracture, dislocation or bone destruction.  IMPRESSION: No definite acute osseous abnormalities.  Calcaneal spurring.  Probable bone island within tarsal navicular.   Electronically Signed   By: Lavonia Dana M.D.   On: 04/18/2014 13:12   Dg Femur Min 2 Views Left  04/18/2014   CLINICAL DATA:  Generalized weakness, bilateral leg pain, metastatic prostate cancer  EXAM: LEFT FEMUR 2 VIEWS  COMPARISON:  02/09/2014  FINDINGS: Four views of the left femur submitted. No acute fracture or subluxation. No destructive bony lesion. Stable sclerotic focus in left femoral head and left proximal femur without change from prior exam. This may be due to bone islands or treated metastasis.  IMPRESSION: Negative.   Electronically Signed   By: Lahoma Crocker M.D.   On: 04/18/2014 13:13   Dg Femur,  Min 2 Views Right  04/18/2014   CLINICAL DATA:  Acute bilateral femur pain.  EXAM: RIGHT FEMUR 2 VIEWS  COMPARISON:  None.  FINDINGS: There is no evidence of fracture or other focal bone lesions. Soft tissues are unremarkable.  IMPRESSION: Normal right femur.   Electronically Signed   By: Marijo Conception, M.D.   On: 04/18/2014 13:08     EKG: Independently reviewed. Atrial Fibrillation rate 100, No acute S-T changes   Assessment/Plan:   81 y.o. male with  Principal Problem:   1.   CVA (cerebral infarction)   CVA/TIA Workup initiated and CT head Negative for acute findings MRI/MRA, CArotid US, and 2D ECHO ordered Neuro Checks ASA Rs  Telemetry Monitoring Check Fasting Lipids,and HbA1c.  Neurology Consulted   Active Problems:   2.   Chronic atrial fibrillation   On Coumadin Rx        3.   Hypercholesterolemia   Continue Atorvastatin and Omega 3 Fatty Acids     4.   Prostate cancer metastatic to bone   His Xtandi has been held for the past month due to increased weakness in his legs   He receives Lupron injections monthly   His Oncologist is Dr Alen Blew    Please Notify in AM    His Radiation Oncologist is Dr Tammi Klippel     5.   Chronic kidney disease (CKD), stage III (moderate)   Monitor BUN/Cr     6.   Chronic diastolic heart failure   Monitor for Signs of Fluid Overload     7.   Essential hypertension   On Amlodipine, and Lasix Rx     8.   History of stroke- + ICH        9.   Chronic back pain   Due to Metastatic Dz    10.   DVT Prophylaxis    On Full dose Anticoagulation with Coumadin Rx          Code Status:     FULL CODE       Family Communication:   Wife at Bedside       Disposition Plan:    Inpatient   Observation Status        Time spent:  99 Minutes      [facility] Pager [phone]   If [place] Please Contact the Day Rounding Team MD for [facility]  If 7PM-7AM, Please Contact Night-Floor Coverage  [email or web address] Password [email or web address] 04/19/2014, 10:40 PM     ADDENDUM:   Patient was seen and examined on 04/19/2014

## 2014-04-19 NOTE — Telephone Encounter (Signed)
CALLED PATIENT TO INFORM OF MRI ON 05-01-14 - ARRIVAL TIME - 11:45 AM @ [facility], SPOKE WITH PATIENT'S WIFE AND SHE IS AWARE OF THESE TESTS.

## 2014-04-19 NOTE — ED Notes (Signed)
Patient transported to MRI 

## 2014-04-19 NOTE — Telephone Encounter (Signed)
Called Dr. Alen Blew nurse phone left voice message ,Dr. Levada Dy went to the ED yesterday 04/19/14 ,wife unable to get patient out of bed,his legs wouldn't hold him up, Xrays,labs were done  and IVF's were given, and patient was able to walk with walker afterwards ,but very weak,  and therefore d/c home, Dr. Lisbeth Renshaw called wife, and f/u today cancelled, but is becoming more difficult to take are of patient, Dr. Lisbeth Renshaw will order an MRI this coming week and wanted Dr. Clement Husbands to be aware of patient's recent ED visit and deconditioning 7:47 AM

## 2014-04-19 NOTE — ED Provider Notes (Signed)
CSN: [id number]     Arrival date & time 04/19/14  1932 History   First MD Initiated Contact with Patient 04/19/14 1937     Chief Complaint  Patient presents with  . Code Stroke    @EDPCLEARED @ (Consider location/radiation/quality/duration/timing/severity/associated sxs/prior Treatment) The history is provided by the patient and the spouse. The history is limited by the condition of the patient.  Craig Abril. is a 81 y.o. male history of hyperlipidemia, A. fib on Coumadin, metastatic prostate cancer here presenting with a aphasia. Since 3 PM, patient had several episodes of expressive aphasia. Since about 5 PM his aphasia got worse. He was seen yesterday for diffuse weakness and had labs and urine that were unremarkable. He was unable to ambulate yesterday.     Level V caveat- condition of patient   Past Medical History  Diagnosis Date  . Hyperlipidemia   . Hypokalemia   . Hypertensive heart disease   . Chronic back pain   . History of epistaxis 07/19/2002  . Personal history of long-term (current) use of anticoagulants   . ICH (intracerebral hemorrhage) ~ 1999    after TPA/notes 09/27/2012  . Hypertension   . Atrial fibrillation   . PAT (paroxysmal atrial tachycardia)   . Pneumonia     "once; several years ago" (09/27/2012)  . GERD (gastroesophageal reflux disease)   . H/O hiatal hernia   . Migraines     "migraines without headaches years ago" (09/27/2012)  . Stroke ~ 1999    ischemic / right cerebellar/posterior inferior cerebellar artery / right pos infarct  . Melanoma     "top of my head" (09/27/2012)  . Prostate cancer, primary, with metastasis from prostate to other site     on Lupron injections per GU  . Osteoarthritis of both feet   . S/P radiation therapy 02/13/14-02/27/14    SRS 5/5 spine completed 02/27/14   Past Surgical History  Procedure Laterality Date  . Nasal sinus surgery  1998  . Cataract extraction w/ intraocular lens  implant, bilateral Bilateral ~ 2011   . Skin graft Right 2010  . Melanoma excision  2010    "pre-melanoma on top of head; did skin graft from left thigh to cover" (09/27/2012)  . Tonsillectomy  ~ 1935   Family History  Problem Relation Age of Onset  . Heart failure Mother   . Heart attack Father    History  Substance Use Topics  . Smoking status: Former Smoker -- .5 years    Types: Cigarettes    Quit date: 02/22/1953  . Smokeless tobacco: Never Used  . Alcohol Use: Yes     Comment: 09/27/2012 "glass of wine or 2/month"    Review of Systems  Unable to perform ROS: Mental status change      Allergies  Pravachol and Zocor  Home Medications   Prior to Admission medications   Medication Sig Start Date End Date Taking? Authorizing Provider  acetaminophen (TYLENOL) 500 MG tablet Take 500 mg by mouth 2 (two) times daily. Takes one in the AM and one qhs    Historical Provider, MD  amLODipine (NORVASC) 5 MG tablet Take 1 tablet (5 mg total) by mouth daily. 02/12/14 02/12/15  Ripudeep Krystal Eaton, MD  atorvastatin (LIPITOR) 10 MG tablet Take 1 tablet (10 mg total) by mouth daily. 09/12/13   [name], MD  B Complex-C (B-COMPLEX WITH VITAMIN C) tablet Take 1 tablet by mouth daily.      Historical Provider, MD  Denosumab (XGEVA [place]) Inject into the skin every 30 (thirty) days. monthly    Historical Provider, MD  Docusate Calcium (STOOL SOFTENER PO) Take 1 capsule by mouth 2 (two) times daily.    Historical Provider, MD  furosemide (LASIX) 40 MG tablet Take 1 tablet (40 mg total) by mouth daily. 03/01/14   [name], MD  Leuprolide Acetate (LUPRON DEPOT IM) Inject 1 each into the muscle every 6 (six) months.     Historical Provider, MD  losartan (COZAAR) 100 MG tablet Take 1 tablet (100 mg total) by mouth daily. 02/13/14   Reyne Dumas, MD  multivitamin [facility]) per tablet Take 1 tablet by mouth daily. ( with B - Complex )    Historical Provider, MD  Omega-3 Fatty Acids (FISH OIL PO) Take 1 capsule by mouth daily. (  Mega Red )    Historical Provider, MD  pantoprazole (PROTONIX) 40 MG tablet Take 1 tablet (40 mg total) by mouth daily. While on steroids 02/12/14   Ripudeep Krystal Eaton, MD  solifenacin (VESICARE) 5 MG tablet Take 5 mg by mouth daily.    Historical Provider, MD  warfarin (COUMADIN) 5 MG tablet Take 1 tablet (5 mg total) by mouth as directed. Patient taking differently: Take 2.5-5 mg by mouth daily. 5mg  daily on Monday and  Wednesday, 2.5mg  daily all other days 01/07/14   [name], MD  XTANDI 40 MG capsule TAKE 2 CAPSULES BY MOUTH DAILY 03/12/14   Wyatt Portela, MD  ZETIA 10 MG tablet TAKE 1 TABLET BY MOUTH ONCE DAILY 02/01/14   [name], MD   BP 192/76 mmHg  Pulse 94  Temp(Src) 97.8 F (36.6 C) (Oral)  Resp 16  Ht 6' (1.829 m)  Wt 195 lb (88.451 kg)  BMI 26.44 kg/m2  SpO2 100% Physical Exam  Constitutional:  Chronically ill   HENT:  Head: Normocephalic.  MM slightly dry   Eyes: Conjunctivae are normal. Pupils are equal, round, and reactive to light.  Neck: Normal range of motion. Neck supple.  Cardiovascular: Normal rate, regular rhythm and normal heart sounds.   Pulmonary/Chest: Effort normal and breath sounds normal. No respiratory distress. He has no wheezes. He has no rales.  Abdominal: Soft. Bowel sounds are normal. He exhibits no distension. There is no tenderness. There is no rebound.  Musculoskeletal: Normal range of motion. He exhibits no edema or tenderness.  Neurological:  Alert, slightly slurred speech. Mild R facial droop. Strength 4/5 R arm, otherwise 5/5   Skin: Skin is warm and dry.  Psychiatric:  Unable   Nursing note and vitals reviewed.   ED Course  Procedures (including critical care time) Labs Review Labs Reviewed  PROTIME-INR - Abnormal; Notable for the following:    Prothrombin Time 21.5 (*)    INR 1.84 (*)    All other components within normal limits  APTT - Abnormal; Notable for the following:    aPTT 38 (*)    All other components  within normal limits  DIFFERENTIAL - Abnormal; Notable for the following:    Eosinophils Relative 7 (*)    All other components within normal limits  COMPREHENSIVE METABOLIC PANEL - Abnormal; Notable for the following:    Glucose, Bld 123 (*)    BUN 30 (*)    Creatinine, Ser 1.42 (*)    Albumin 3.2 (*)    GFR calc non Af Amer 43 (*)    GFR calc Af Amer 50 (*)    All other components within normal  limits  I-STAT CHEM 8, ED - Abnormal; Notable for the following:    BUN 33 (*)    Glucose, Bld 120 (*)    All other components within normal limits  ETHANOL  CBC  URINE RAPID DRUG SCREEN (HOSP PERFORMED)  URINALYSIS, ROUTINE W REFLEX MICROSCOPIC  HEMOGLOBIN A1C  LIPID PANEL  I-STAT TROPOININ, ED  Randolm Idol, ED    Imaging Review Dg Chest 2 View  04/18/2014   CLINICAL DATA:  Generalized weakness, metastatic prostate cancer  EXAM: CHEST  2 VIEW  COMPARISON:  04/12/2013  FINDINGS: Cardiomediastinal silhouette is stable. No acute infiltrate or pulmonary edema. Again noted atelectasis or scarring in left base. Stable compression fracture of of T6 vertebral body. There is mild compression deformity and prior vertebroplasty at T11 level. Stable.  IMPRESSION: No acute infiltrate or pulmonary edema. Stable left basilar atelectasis or scarring. Compression deformities thoracic spine as described above.   Electronically Signed   By: Lahoma Crocker M.D.   On: 04/18/2014 13:06   Dg Lumbar Spine Complete  04/18/2014   CLINICAL DATA:  Bilateral lower extremity weakness.  EXAM: LUMBAR SPINE - COMPLETE 4+ VIEW  COMPARISON:  None.  FINDINGS: No fracture or spondylolisthesis is noted. Status post T11 kyphoplasty. Mild degenerative disc disease is noted at L2-3, L3-4, L4-5 and L5-S1. Atherosclerotic calcifications of abdominal aorta are noted.  IMPRESSION: Mild multilevel degenerative disc disease is noted. No acute abnormality seen in the lumbar spine.   Electronically Signed   By: Marijo Conception, M.D.   On:  04/18/2014 13:11   Dg Pelvis 1-2 Views  04/18/2014   CLINICAL DATA:  Generalized weakness, metastatic prostate cancer, chemotherapy and radiation therapy  EXAM: PELVIS - 1-2 VIEW  COMPARISON:  None  FINDINGS: No pelvic fracture is noted. Stable sclerotic focus in left femoral head and left proximal femur without change from prior exam. Mild patchy sclerosis bilateral iliac bones and pubic rami, stable from prior exam. This may be due to treated metastatic disease. No destructive bony lesion is noted.  IMPRESSION: No acute fracture or subluxation. Stable sclerotic foci as described above.   Electronically Signed   By: Lahoma Crocker M.D.   On: 04/18/2014 13:11   Ct Head Wo Contrast  04/19/2014   CLINICAL DATA:  Aphasia.  Last known well at 16:00  EXAM: CT HEAD WITHOUT CONTRAST  TECHNIQUE: Contiguous axial images were obtained from the base of the skull through the vertex without intravenous contrast.  COMPARISON:  Brain MRI 02/09/2014  FINDINGS: Remote infarction in the right cerebellum.  No acute intracranial hemorrhage. No focal mass lesion. No CT evidence of acute infarction. No midline shift or mass effect. No hydrocephalus. Basilar cisterns are patent.  Extensive cortical atrophy. Periventricular white matter hypodensities.  Paranasal sinuses and  mastoid air cells are clear.  IMPRESSION: 1. No CT evidence of acute infarction. 2. No intracranial hemorrhage. 3. Remote right cerebellar infarction. 4. Extensive atrophy and white matter microvascular disease. Findings conveyed toCamilo, MDon 04/19/2014  at19:55.   Electronically Signed   By: Suzy Bouchard M.D.   On: 04/19/2014 19:57   Dg Foot Complete Right  04/18/2014   CLINICAL DATA:  Generalized weakness, prostate cancer metastatic to bone being tree with radiation and chemotherapy, weakness in legs beginning this morning, unable to stand, acute onset of BILATERAL leg pain at the femora, generalized new RIGHT foot pain  EXAM: RIGHT FOOT COMPLETE - 3+ VIEW   COMPARISON:  None  FINDINGS: Mild osseous demineralization.  Joint spaces preserved.  Small plantar calcaneal spur.  Small sclerotic focus within the mid tarsal navicular favoring bone island at this site.  No acute fracture, dislocation or bone destruction.  IMPRESSION: No definite acute osseous abnormalities.  Calcaneal spurring.  Probable bone island within tarsal navicular.   Electronically Signed   By: Lavonia Dana M.D.   On: 04/18/2014 13:12   Dg Femur Min 2 Views Left  04/18/2014   CLINICAL DATA:  Generalized weakness, bilateral leg pain, metastatic prostate cancer  EXAM: LEFT FEMUR 2 VIEWS  COMPARISON:  02/09/2014  FINDINGS: Four views of the left femur submitted. No acute fracture or subluxation. No destructive bony lesion. Stable sclerotic focus in left femoral head and left proximal femur without change from prior exam. This may be due to bone islands or treated metastasis.  IMPRESSION: Negative.   Electronically Signed   By: Lahoma Crocker M.D.   On: 04/18/2014 13:13   Dg Femur, Min 2 Views Right  04/18/2014   CLINICAL DATA:  Acute bilateral femur pain.  EXAM: RIGHT FEMUR 2 VIEWS  COMPARISON:  None.  FINDINGS: There is no evidence of fracture or other focal bone lesions. Soft tissues are unremarkable.  IMPRESSION: Normal right femur.   Electronically Signed   By: Marijo Conception, M.D.   On: 04/18/2014 13:08     EKG Interpretation   Date/Time:  Friday April 19 2014 19:53:52 EST Ventricular Rate:  100 PR Interval:    QRS Duration: 97 QT Interval:  401 QTC Calculation: 517 R Axis:   -48 Text Interpretation:  Atrial fibrillation Ventricular bigeminy Left  anterior fascicular block Minimal ST depression, lateral leads Prolonged  QT interval No significant change since last tracing Confirmed by YAO  MD,  DAVID ([id number]) on 04/19/2014 8:07:06 PM      MDM   Final diagnoses:  Stroke with cerebral ischemia    Craig Khachatryan. is a 81 y.o. male here with possible stroke. Code stroke  activated by EMS. Neuro at bedside. Likely admit for stroke workup.   8:47 PM Neuro doesn't want TPA. Will admit for stroke workup.    Wandra Arthurs, MD 04/19/14 2053

## 2014-04-19 NOTE — ED Notes (Signed)
Pt back from mri

## 2014-04-20 DIAGNOSIS — I10 Essential (primary) hypertension: Secondary | ICD-10-CM

## 2014-04-20 LAB — PROTIME-INR
INR: 1.82 — ABNORMAL HIGH (ref 0.00–1.49)
Prothrombin Time: 21.2 seconds — ABNORMAL HIGH (ref 11.6–15.2)

## 2014-04-20 MED ORDER — WARFARIN - PHARMACIST DOSING INPATIENT: Freq: Every day | Status: DC

## 2014-04-20 MED ORDER — WARFARIN SODIUM 5 MG PO TABS
5.0000 mg | ORAL_TABLET | Freq: Once | ORAL | Status: DC
  Filled 2014-04-20: qty 1

## 2014-04-20 MED ORDER — HYDRALAZINE HCL 20 MG/ML IJ SOLN
10.0000 mg | Freq: Four times a day (QID) | INTRAMUSCULAR | Status: DC | PRN
  Administered 2014-04-20 – 2014-04-21 (×2): 10 mg via INTRAVENOUS
  Filled 2014-04-20 (×2): qty 1

## 2014-04-20 MED ORDER — TAMSULOSIN HCL 0.4 MG PO CAPS
0.4000 mg | ORAL_CAPSULE | Freq: Every day | ORAL | Status: DC
  Administered 2014-04-20 – 2014-04-23 (×4): 0.4 mg via ORAL
  Filled 2014-04-20 (×4): qty 1

## 2014-04-20 NOTE — Evaluation (Signed)
Physical Therapy Evaluation Patient Details Name: Craig [name]. MRN: [id number] DOB: 05/03/1924 Today's Date: 04/20/2014   History of Present Illness  Pt is an 81 y/o male with a history of a previous CVA/ICH in 2002, TIA's, A-fib, HTN, hyperlipidemia and prostate cancer with metastatic disease to the bone who presents tot he ED with complaints of difficulty speaking which occured around 3:45pm. His wife had noticed his garbled speech at that time. His wife reports to the ED that the symptoms resolved but then returned at [place] and did not resolve. He was brought to the ED in the next hour when his symptoms did not appear to be improving. CT and MRI acutely unremarkable.  Clinical Impression  Pt admitted with above diagnosis. Pt currently with functional limitations due to the deficits listed below (see PT Problem List). At the time of PT eval pt was able to perform transfers with heavy mod assist. Pt's wife attempting to assist as well during session, and it is unclear if pt would have performed better if PT was the only one assisting and giving cues.    Pt will benefit from skilled PT to increase their independence and safety with mobility to allow discharge to the venue listed below.  Wife reports that at baseline pt requires assist for mobility and ADL's with varying levels of assist required due to medications. Pt and wife are anticipating return home. If pt does not make functional improvement with therapy, SNF may be more appropriate.      Follow Up Recommendations Home health PT;Supervision/Assistance - 24 hour    Equipment Recommendations  None recommended by PT    Recommendations for Other Services       Precautions / Restrictions Precautions Precautions: Fall Restrictions Weight Bearing Restrictions: No      Mobility  Bed Mobility Overal bed mobility: Needs Assistance Bed Mobility: Supine to Sit     Supine to sit: Mod assist;HOB elevated     General bed mobility  comments: Pt was being cued to reach for bed rails, and then roll right so pt could reach bed rails better. Was not able to follow these commands. Hand-over-hand assist provided to reach for bed rails, and pt resisting - wanted to sit straight up into long sitting. Heavy mod assist provided for pt to sit up this way.   Transfers Overall transfer level: Needs assistance Equipment used: Rolling walker (2 wheeled) Transfers: Sit to/from [facility] Sit to Stand: Mod assist Stand pivot transfers: Min assist       General transfer comment: Pt was able to power-up to full stand from elevated bed height and mod assist. Min assist for balance and walker positioning as pt took pivotal steps around to the chair.   Ambulation/Gait             General Gait Details: Unable at this time.   Stairs            Wheelchair Mobility    Modified Rankin (Stroke Patients Only)       Balance Overall balance assessment: Needs assistance Sitting-balance support: Feet supported;Bilateral upper extremity supported Sitting balance-Leahy Scale: Poor Sitting balance - Comments: Pt requires UE support to maintain seated balance at this time.  Postural control: Posterior lean Standing balance support: Bilateral upper extremity supported;During functional activity Standing balance-Leahy Scale: Poor  Pertinent Vitals/Pain Pain Assessment: No/denies pain    Home Living Family/patient expects to be discharged to:: Private residence Living Arrangements: Spouse/significant other Available Help at Discharge: Family Type of Home: House Home Access: Level entry     Home Layout: Two level Home Equipment: Shower seat - built in;Walker - 2 wheels;Grab bars - tub/shower;Walker - 4 wheels (chair lift)      Prior Function Level of Independence: Needs assistance   Gait / Transfers Assistance Needed: assist with standing, some assist for  mobility, more recently required increased assist.   ADL's / Homemaking Assistance Needed: Wife assists with all ADL's - per wife he is mostly dependent for bathing and dressing.         Hand Dominance   Dominant Hand: Right    Extremity/Trunk Assessment   Upper Extremity Assessment: Defer to OT evaluation           Lower Extremity Assessment: Generalized weakness      Cervical / Trunk Assessment: Kyphotic  Communication   Communication: No difficulties  Cognition Arousal/Alertness: Lethargic Behavior During Therapy: WFL for tasks assessed/performed Overall Cognitive Status: Within Functional Limits for tasks assessed                      General Comments      Exercises        Assessment/Plan    PT Assessment Patient needs continued PT services  PT Diagnosis Difficulty walking;Generalized weakness   PT Problem List Decreased strength;Decreased range of motion;Decreased activity tolerance;Decreased balance;Decreased mobility;Decreased knowledge of use of DME;Decreased safety awareness;Decreased knowledge of precautions  PT Treatment Interventions DME instruction;Gait training;Stair training;Functional mobility training;Therapeutic activities;Therapeutic exercise;Neuromuscular re-education;Patient/family education   PT Goals (Current goals can be found in the Care Plan section) Acute Rehab PT Goals Patient Stated Goal: Pt did not state goals at this time. Pt's wife would like pt to return home at d/c.  PT Goal Formulation: With patient/family Time For Goal Achievement: 05/18/14 Potential to Achieve Goals: Fair    Frequency Min 3X/week   Barriers to discharge        Co-evaluation               End of Session Equipment Utilized During Treatment: Gait belt Activity Tolerance: Patient limited by fatigue Patient left: in chair;with call bell/phone within reach;with family/visitor present;Other (comment) (MD present in room) Nurse Communication:  Mobility status    Functional Assessment Tool Used: Clinical judgement Functional Limitation: Mobility: Walking and moving around Mobility: Walking and Moving Around Current Status (Q7341): At least 40 percent but less than 60 percent impaired, limited or restricted Mobility: Walking and Moving Around Goal Status [phone]): At least 40 percent but less than 60 percent impaired, limited or restricted    Time: 0829-0857 PT Time Calculation (min) (ACUTE ONLY): 28 min   Charges:   PT Evaluation $Initial PT Evaluation Tier I: 1 Procedure PT Treatments $Therapeutic Activity: 8-22 mins   PT G Codes:   PT G-Codes **NOT FOR INPATIENT CLASS** Functional Assessment Tool Used: Clinical judgement Functional Limitation: Mobility: Walking and moving around Mobility: Walking and Moving Around Current Status (W4097): At least 40 percent but less than 60 percent impaired, limited or restricted Mobility: Walking and Moving Around Goal Status [phone]): At least 40 percent but less than 60 percent impaired, limited or restricted    Rolinda Roan 04/20/2014, 9:18 AM  Rolinda Roan, PT, DPT Acute Rehabilitation Services Pager: [phone]

## 2014-04-20 NOTE — Progress Notes (Signed)
ANTICOAGULATION CONSULT NOTE - Follow-up Consult  Pharmacy Consult for Coumadin Indication: atrial fibrillation  Allergies  Allergen Reactions  . Pravachol     Muscle weakness  . Zocor [Simvastatin - High Dose]     Muscle weakness    Patient Measurements: Height: 6' (182.9 cm) Weight: 184 lb 11.2 oz (83.779 kg) IBW/kg (Calculated) : 77.6  Vital Signs: Temp: 98.6 F (37 C) (02/27 1100) Temp Source: Oral (02/27 1100) BP: 165/77 mmHg (02/27 1100) Pulse Rate: 77 (02/27 1100)  Labs:  Recent Labs  04/18/14 1208 04/19/14 1942 04/19/14 1947 04/20/14 0610  HGB 13.1 13.7 15.6  --   HCT 40.5 42.2 46.0  --   PLT 204 200  --   --   APTT  --  38*  --   --   LABPROT 22.6* 21.5*  --  21.2*  INR 1.97* 1.84*  --  1.82*  CREATININE 1.24 1.42* 1.30  --     Estimated Creatinine Clearance: 43.9 mL/min (by C-G formula based on Cr of 1.3).  Assessment: 81 y.o. male presents with multiple episodes of expressive aphasia - h/o CVA. Pt on coumadin PTA for afib. INR remains subtherapeutic at 1.82. Pt received 2.5mg  this a.m. ~0100. CBC stable. No bleeding noted. Home dose: 2.5mg  daily except for 5mg  on Mon and Wed  Goal of Therapy:  INR 2-3 Monitor platelets by anticoagulation protocol: Yes   Plan:  -D/c scheduled coumadin -warfarin 5mg  tonight -daily INR -follow for s/s bleeding -consider decrease ASA to 81mg  with full dose anticoagulation with coumadin  Sherlon Handing, PharmD, BCPS Clinical pharmacist, pager [phone] 04/20/2014,1:25 PM

## 2014-04-20 NOTE — Progress Notes (Signed)
ANTICOAGULATION CONSULT NOTE - Initial Consult  Pharmacy Consult for Coumadin Indication: atrial fibrillation  Allergies  Allergen Reactions  . Pravachol     Muscle weakness  . Zocor [Simvastatin - High Dose]     Muscle weakness    Patient Measurements: Height: 6' (182.9 cm) Weight: 184 lb 11.2 oz (83.779 kg) IBW/kg (Calculated) : 77.6  Vital Signs: Temp: 97.7 F (36.5 C) (02/27 0300) Temp Source: Oral (02/27 0300) BP: 174/84 mmHg (02/27 0100) Pulse Rate: 100 (02/27 0100)  Labs:  Recent Labs  04/18/14 1208 04/19/14 1942 04/19/14 1947  HGB 13.1 13.7 15.6  HCT 40.5 42.2 46.0  PLT 204 200  --   APTT  --  38*  --   LABPROT 22.6* 21.5*  --   INR 1.97* 1.84*  --   CREATININE 1.24 1.42* 1.30    Estimated Creatinine Clearance: 43.9 mL/min (by C-G formula based on Cr of 1.3).   Medical History: Past Medical History  Diagnosis Date  . Hyperlipidemia   . Hypokalemia   . Hypertensive heart disease   . Chronic back pain   . History of epistaxis 07/19/2002  . Personal history of long-term (current) use of anticoagulants   . ICH (intracerebral hemorrhage) ~ 1999    after TPA/notes 09/27/2012  . Hypertension   . Atrial fibrillation   . PAT (paroxysmal atrial tachycardia)   . Pneumonia     "once; several years ago" (09/27/2012)  . GERD (gastroesophageal reflux disease)   . H/O hiatal hernia   . Migraines     "migraines without headaches years ago" (09/27/2012)  . Stroke ~ 1999    ischemic / right cerebellar/posterior inferior cerebellar artery / right pos infarct  . Melanoma     "top of my head" (09/27/2012)  . Prostate cancer, primary, with metastasis from prostate to other site     on Lupron injections per GU  . Osteoarthritis of both feet   . S/P radiation therapy 02/13/14-02/27/14    SRS 5/5 spine completed 02/27/14    Medications:  Prescriptions prior to admission  Medication Sig Dispense Refill Last Dose  . acetaminophen (TYLENOL) 500 MG tablet Take 500 mg by  mouth 2 (two) times daily. Takes one in the AM and one qhs   04/19/2014 at 345 pm  . amLODipine (NORVASC) 5 MG tablet Take 1 tablet (5 mg total) by mouth daily. 90 tablet 1 04/19/2014 at Unknown time  . atorvastatin (LIPITOR) 10 MG tablet Take 1 tablet (10 mg total) by mouth daily. 90 tablet 11 04/19/2014 at Unknown time  . B Complex-C (B-COMPLEX WITH VITAMIN C) tablet Take 1 tablet by mouth daily.     04/19/2014 at Unknown time  . Denosumab (XGEVA [place]) Inject into the skin every 30 (thirty) days. monthly   03/25/2014  . Docusate Calcium (STOOL SOFTENER PO) Take 1 capsule by mouth 2 (two) times daily.   04/19/2014 at Unknown time  . furosemide (LASIX) 40 MG tablet Take 1 tablet (40 mg total) by mouth daily. 90 tablet 3 04/19/2014 at Unknown time  . Leuprolide Acetate (LUPRON DEPOT IM) Inject 1 each into the muscle every 6 (six) months.    Taking  . losartan (COZAAR) 100 MG tablet Take 1 tablet (100 mg total) by mouth daily. 30 tablet 2 04/19/2014 at Unknown time  . multivitamin (THERAGRAN) per tablet Take 1 tablet by mouth daily. ( with B - Complex )   04/19/2014 at Unknown time  . Omega-3 Fatty Acids (FISH OIL PO)  Take 1 capsule by mouth daily. ( Mega Red )   04/19/2014 at Unknown time  . pantoprazole (PROTONIX) 40 MG tablet Take 1 tablet (40 mg total) by mouth daily. While on steroids 30 tablet 3 04/19/2014 at Unknown time  . solifenacin (VESICARE) 5 MG tablet Take 5 mg by mouth daily.   04/19/2014 at Unknown time  . warfarin (COUMADIN) 5 MG tablet Take 1 tablet (5 mg total) by mouth as directed. (Patient taking differently: Take 2.5-5 mg by mouth daily. 5mg  daily on Monday and  Wednesday, 2.5mg  daily all other days) 30 tablet 3 04/18/2014 at Unknown time  . XTANDI 40 MG capsule TAKE 2 CAPSULES BY MOUTH DAILY 60 capsule 0 04/19/2014 at Unknown time  . ZETIA 10 MG tablet TAKE 1 TABLET BY MOUTH ONCE DAILY 30 tablet 5 04/19/2014 at Unknown time   Scheduled:  . sodium chloride   Intravenous STAT  . amLODipine  5 mg  Oral Daily  . aspirin  300 mg Rectal Daily   Or  . aspirin  325 mg Oral Daily  . atorvastatin  10 mg Oral Daily  . B-complex with vitamin C  1 tablet Oral Daily  . darifenacin  7.5 mg Oral Daily  . docusate sodium  100 mg Oral BID  . ezetimibe  10 mg Oral Daily  . furosemide  40 mg Oral Daily  . losartan  100 mg Oral Daily  . multivitamin with minerals  1 tablet Oral Daily  . omega-3 acid ethyl esters  1 g Oral Daily  . pantoprazole  40 mg Oral Daily  . warfarin  2.5 mg Oral Once per day on Sun Tue Thu Fri Sat  . [START ON 04/22/2014] warfarin  5 mg Oral Once per day on Mon Wed  . Warfarin - Pharmacist Dosing Inpatient   Does not apply q1800    Assessment: 81yo male with prior CVA presents after multiple episodes of expressive aphasia to continue Coumadin for Afib during admission for CVA/TIA; current INR slightly subtherapeutic.  Goal of Therapy:  INR 2-3   Plan:  Rec'd Coumadin 2.5mg  after admission; will await current INR prior to further dosing.  Wynona Neat, PharmD, BCPS  04/20/2014,4:25 AM

## 2014-04-20 NOTE — Progress Notes (Signed)
Md instructed writer to hold coumadin this pm for mild bleeding and resume tomorrow evening.

## 2014-04-20 NOTE — Progress Notes (Signed)
STROKE TEAM PROGRESS NOTE   HISTORY Craig Mcgath. is an 81 y.o. male with a past medical history significant for hyperlipidemia, HTN, atrial fibrillation on coumadin, ischemic infarct s/p IV tpa complicated by ICH, migraines, prostate cancer metastatic to bone, brought in via EMS as a code stroke due to acute onset of transient language impairment. Wife is at the bedside and said that he was home siting in a recliner watching TV when she noticed that his speech was very garbled and he was little confused for a couple of minutes. She did not noticed any other changes but called EMS and reportedly patient had several episodes of transient language impairment since being evaluated by EMS. There is not reported HA, vertigo, double vision, focal arm/legs weakness, or visual disturbances. NIHSS 2. CT brain showed no acute abnormality. INR 1.84   Date last known well: 04/19/13 Time last known well: 3:45 tPA Given: no, out of the window, minimal deficits,previous post IV tpa ICH, coumadin with INR NIHSS: 2 MRS: 0     SUBJECTIVE (INTERVAL HISTORY) The patient's wife is at the bedside. Dr. Irish Elders discussed changing Coumadin to one of the newer agents; however, the patient's wife stated that the patient's cardiologist, Dr. Mare Ferrari, was supposed to this in the past because of the patient's history of nosebleeds. We will defer to Dr. Mare Ferrari. The patient has an appointment to see Dr. Mare Ferrari in approximately one week. The patient remains mildly confused. His wife reports that he has been very weak after undergoing therapy for his prostate cancer with bone metastasis. He may benefit from inpatient rehabilitation. Await therapist's evaluations.   OBJECTIVE Temp:  [97.5 F (36.4 C)-97.8 F (36.6 C)] 97.5 F (36.4 C) (02/27 0500) Pulse Rate:  [64-100] 95 (02/27 0500) Cardiac Rhythm:  [-] Atrial fibrillation (02/27 0300) Resp:  [15-21] 18 (02/27 0500) BP: (160-192)/(74-108) 160/75 mmHg  (02/27 0500) SpO2:  [96 %-100 %] 96 % (02/27 0500) Weight:  [83.779 kg (184 lb 11.2 oz)-88.451 kg (195 lb)] 83.779 kg (184 lb 11.2 oz) (02/26 2301)   Recent Labs Lab 04/19/14 1957  GLUCAP 123*    Recent Labs Lab 04/18/14 1208 04/19/14 1942 04/19/14 1947  NA 142 140 142  K 3.8 4.0 4.0  CL 108 103 103  CO2 28 27  --   GLUCOSE 113* 123* 120*  BUN 32* 30* 33*  CREATININE 1.24 1.42* 1.30  CALCIUM 9.5 10.0  --     Recent Labs Lab 04/18/14 1208 04/19/14 1942  AST 17 22  ALT 10 13  ALKPHOS 55 70  BILITOT 0.5 0.7  PROT 6.5 7.1  ALBUMIN 3.0* 3.2*    Recent Labs Lab 04/18/14 1208 04/19/14 1942 04/19/14 1947  WBC 9.1 10.0  --   NEUTROABS 5.9 5.8  --   HGB 13.1 13.7 15.6  HCT 40.5 42.2 46.0  MCV 100.7* 98.4  --   PLT 204 200  --    No results for input(s): CKTOTAL, CKMB, CKMBINDEX, TROPONINI in the last 168 hours.  Recent Labs  04/18/14 1208 04/19/14 1942 04/20/14 0610  LABPROT 22.6* 21.5* 21.2*  INR 1.97* 1.84* 1.82*    Recent Labs  04/18/14 1147 04/19/14 2051  COLORURINE YELLOW YELLOW  LABSPEC 1.014 1.013  PHURINE 6.5 6.5  GLUCOSEU NEGATIVE NEGATIVE  HGBUR NEGATIVE SMALL*  BILIRUBINUR NEGATIVE NEGATIVE  KETONESUR NEGATIVE NEGATIVE  PROTEINUR NEGATIVE NEGATIVE  UROBILINOGEN 0.2 1.0  NITRITE NEGATIVE NEGATIVE  LEUKOCYTESUR NEGATIVE NEGATIVE       Component Value Date/Time  CHOL 167 01/21/2014 1055   TRIG 160.0* 01/21/2014 1055   HDL 39.80 01/21/2014 1055   CHOLHDL 4 01/21/2014 1055   VLDL 32.0 01/21/2014 1055   LDLCALC 95 01/21/2014 1055   Lab Results  Component Value Date   HGBA1C 5.6 09/27/2012      Component Value Date/Time   LABOPIA NONE DETECTED 04/19/2014 2051   COCAINSCRNUR NONE DETECTED 04/19/2014 2051   LABBENZ NONE DETECTED 04/19/2014 2051   AMPHETMU NONE DETECTED 04/19/2014 2051   THCU NONE DETECTED 04/19/2014 2051   LABBARB NONE DETECTED 04/19/2014 2051     Recent Labs Lab 04/19/14 1942  ETH <5    Dg Chest 2  View 04/18/2014    No acute infiltrate or pulmonary edema. Stable left basilar atelectasis or scarring. Compression deformities thoracic spine as described above.     Dg Lumbar Spine Complete 04/18/2014    Mild multilevel degenerative disc disease is noted. No acute abnormality seen in the lumbar spine.      Dg Pelvis 1-2 Views 04/18/2014    No acute fracture or subluxation. Stable sclerotic foci as described above.       Ct Head Wo Contrast 04/19/2014    1. No CT evidence of acute infarction.  2. No intracranial hemorrhage.  3. Remote right cerebellar infarction.  4. Extensive atrophy and white matter microvascular disease.   [name] Head Wo Contrast 04/19/2014     MRI HEAD:  No acute intracranial process, specifically no acute ischemia.  Remote RIGHT greater than LEFT cerebellar and RIGHT ventral pontine infarcts. Small remote RIGHT cerebellar infarct.  Involutional changes. Mild to moderate white matter changes likely represent chronic small vessel ischemic disease.    MRA HEAD:  Chronic RIGHT vertebral artery occlusion. Mildly irregular LEFT vertebral artery likely reflects atherosclerosis without high-grade stenosis. No acute vascular process though mild motion degraded examination.        Dg Foot Complete Right 04/18/2014    No definite acute osseous abnormalities.  Calcaneal spurring.  Probable bone island within tarsal navicular.      Dg Femur Min 2 Views Left 04/18/2014    Negative.     Dg Femur, Min 2 Views Right 04/18/2014    Normal right femur.       PHYSICAL EXAM Mental Status: Mildly confused with mild slurring of speech. Able to follow 3 step commands without difficulty. Cranial Nerves: II: Visual fields grossly normal, pupils equal, round, reactive to light and accommodation III,IV, VI: ptosis not present, extra-ocular motions intact bilaterally V,VII: smile asymmetric VIII: hearing normal bilaterally IX,X: gag reflex present XI: bilateral shoulder  shrug XII: midline tongue extension without atrophy or fasciculations Motor: Right :Upper extremity 5/5Left: Upper extremity 5/5 Lower extremity 5/5Lower extremity 5/5 Tone and bulk:normal tone throughout; no atrophy noted Sensory: Light touch intact throughout, bilaterally Deep Tendon Reflexes:  2/4 throughout Cerebellar: normal finger-to-nose with tremor Gait:  Deferred for safety reasons    ASSESSMENT/PLAN 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 He did not receive IV t-PA due to late presentation.  Possible TIA:  Dominant secondary to atrial fibrillation with subtherapeutic warfarin level.  Resultant mild confusion  MRI  negative for acute stroke as above.  MRA chronic right vertebral artery occlusion.  Carotid Doppler  pending  2D Echo  pending  LDL  not ordered - will order for a.m.  HgbA1c pending  Warfarin for VTE prophylaxis  Diet Heart with thin liquids  warfarin prior to admission, now on warfarin  Ongoing aggressive stroke risk factor management  Therapy recommendations:  Home health physical therapy recommended  Disposition:  Pending  Hypertension  Home meds:  Norvasc 5 mg daily and Cozaar 100 mg daily  Blood pressure mildly high  Hyperlipidemia  Home meds:  Lipitor 10 mg daily and Saturday at 10 mg daily resumed in hospital  LDL pending , goal < 70  Continue statin at discharge   Other Stroke Risk Factors  Advanced age  Hx stroke/TIA  Atrial fibrillation  Migraine headaches   Other Active Problems  Mildly renal insufficiency - possible  dehydration  Other Pertinent History  Prostate cancer with bone metastasis  PLAN  Possible discharge tomorrow  Pharmacy dosing warfarin - INR 1.82  today   Hospital day #   Mikey Bussing PA-C [facility] Pager [phone] 04/20/2014, 8:37 AM     To contact Stroke Continuity provider, please refer to [email or web address] After hours, contact General Neurology

## 2014-04-20 NOTE — Evaluation (Signed)
Speech Language Pathology Evaluation Patient Details Name: Craig [name]. MRN: [id number] DOB: 02/15/1925 Today's Date: 04/20/2014 Time: 4098-1191 SLP Time Calculation (min) (ACUTE ONLY): 20 min  Problem List:  Patient Active Problem List   Diagnosis Date Noted  . CVA (cerebral infarction) 04/19/2014  . Prostate cancer, primary, with metastasis from prostate to other site 04/19/2014  . Chronic back pain 04/19/2014  . Expressive aphasia   . Bone metastasis 02/12/2014  . Chronic diastolic heart failure 47/82/9562  . Hypertensive heart disease 02/11/2014  . Essential hypertension 02/11/2014  . History of stroke 02/11/2014  . AAA (abdominal aortic aneurysm) 02/11/2014  . Hypokalemia 02/11/2014  . Back pain 02/09/2014  . Fluid overload 02/08/2014  . Left-sided weakness 02/08/2014  . Encounter for therapeutic drug monitoring 05/14/2013  . Chronic kidney disease (CKD), stage III (moderate) 09/27/2012  . Elevated brain natriuretic peptide (BNP) level 09/27/2012  . Hypercalcemia 09/27/2012  . Weakness 09/27/2012  . Epistaxis 01/08/2011  . Cerebellar stroke syndrome 07/27/2010  . Hypercholesterolemia 07/27/2010  . Prostate cancer metastatic to bone 07/27/2010  . Benign hypertensive heart disease without heart failure 07/27/2010  . Renal insufficiency 07/27/2010  . Chronic atrial fibrillation 05/25/2010   Past Medical History:  Past Medical History  Diagnosis Date  . Hyperlipidemia   . Hypokalemia   . Hypertensive heart disease   . Chronic back pain   . History of epistaxis 07/19/2002  . Personal history of long-term (current) use of anticoagulants   . ICH (intracerebral hemorrhage) ~ 1999    after TPA/notes 09/27/2012  . Hypertension   . Atrial fibrillation   . PAT (paroxysmal atrial tachycardia)   . Pneumonia     "once; several years ago" (09/27/2012)  . GERD (gastroesophageal reflux disease)   . H/O hiatal hernia   . Migraines     "migraines without headaches years  ago" (09/27/2012)  . Stroke ~ 1999    ischemic / right cerebellar/posterior inferior cerebellar artery / right pos infarct  . Melanoma     "top of my head" (09/27/2012)  . Prostate cancer, primary, with metastasis from prostate to other site     on Lupron injections per GU  . Osteoarthritis of both feet   . S/P radiation therapy 02/13/14-02/27/14    SRS 5/5 spine completed 02/27/14   Past Surgical History:  Past Surgical History  Procedure Laterality Date  . Nasal sinus surgery  1998  . Cataract extraction w/ intraocular lens  implant, bilateral Bilateral ~ 2011  . Skin graft Right 2010  . Melanoma excision  2010    "pre-melanoma on top of head; did skin graft from left thigh to cover" (09/27/2012)  . Tonsillectomy  ~ 1935   HPI:  Craig [name]. is a 81 y.o. male with a history of a previous CVA/ICH in 2002, TIAs, Atrial Fibrillation on Coumadin Rx, HTN, Hyperlipidemia and Prostate Cancer with Metastatic Disease to the Bone who presents to the ED with complaints of diffculty speaking which occurred around 3:45 pm.  MRI negative for acute event.    Assessment / Plan / Recommendation Clinical Impression  Pt presents with functional cognition and communication, marked by tangential verbalizations, mild memory deficits, but with orientation to time/situation, improved clarity of speech since admission.  No SLP f/u is warranted.      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  Overall Cognitive Status: No family/caregiver present to determine baseline cognitive functioning Arousal/Alertness: Awake/alert Orientation Level: Oriented to  person;Oriented to place;Oriented to time;Oriented to situation Attention: Sustained Sustained Attention: Appears intact Awareness: Appears intact Problem Solving: Impaired Problem Solving Impairment: Verbal basic Executive Function: Writer: Impaired Organizing Impairment: Verbal basic Safety/Judgment: Appears intact    Comprehension  Auditory Comprehension Overall Auditory Comprehension: Appears within functional limits for tasks assessed Visual Recognition/Discrimination Discrimination: Within Function Limits Reading Comprehension Reading Status: Within funtional limits    Expression Expression Primary Mode of Expression: Verbal Verbal Expression Overall Verbal Expression: Appears within functional limits for tasks assessed Written Expression Dominant Hand: Right Written Expression: Not tested   Oral / Motor Oral Motor/Sensory Function Overall Oral Motor/Sensory Function: Appears within functional limits for tasks assessed Motor Speech Overall Motor Speech: Appears within functional limits for tasks assessed   GO Functional Assessment Tool Used: mini mental state Functional Limitations: Memory Memory Current Status (H5747): At least 20 percent but less than 40 percent impaired, limited or restricted Memory Goal Status (B4037): At least 20 percent but less than 40 percent impaired, limited or restricted Memory Discharge Status [phone]): At least 20 percent but less than 40 percent impaired, limited or restricted   Craig [name] 04/20/2014, 10:42 AM

## 2014-04-20 NOTE — Progress Notes (Signed)
UR completed 

## 2014-04-20 NOTE — Progress Notes (Signed)
Patient noted to have small amount of bleeding at urinary meatus and in FC tubing. Has coumadin scheduled this evening. MD notified per Amion.

## 2014-04-20 NOTE — Progress Notes (Signed)
VASCULAR LAB PRELIMINARY  PRELIMINARY  PRELIMINARY  PRELIMINARY  Carotid Dopplers completed.    Preliminary report:  1-39% ICA stenosis.  Right vertebral artery flow is antegrade.  Left vertebral artery flow not insonated.  Keeva Reisen, RVT 04/20/2014, 3:15 PM

## 2014-04-20 NOTE — Progress Notes (Signed)
16 fr FC placed by sterile technique. Moderate difficulty passing prostate. Immediate urine return 300cc cloudy/yellow. Patient tolerated well, no blood at meatus or noted in urine.

## 2014-04-20 NOTE — Progress Notes (Signed)
Patient unable to void. Bladder scan 350cc, reported to MD. Order to place [facility].

## 2014-04-20 NOTE — Progress Notes (Signed)
[facility] PROGRESS NOTE  Craig [name]. XHF:[id number] DOB: [birth year] DOA: 04/19/2014 PCP: [name], MD  Assessment/Plan: 1. Dysarthria- resolved, MRI head is negative for stroke. Neurology is following.  2. Urinary retention- will check bladder scan, if urinary retention will insert Foley catheter. Also start Flomax 0.4 mg daily. 3. Prostate cancer with metastasis- patient is currently off Xtandi as per wife due to weakness of the lower extremities. Will follow-up as outpatient with Dr. Alen Blew. 4. Chronic diastolic CHF- currently compensated 5. Essential hypertension- continue Lodine and Lasix 6. Chronic atrial fibrillation- hearted controlled, continue Coumadin per pharmacy consultation 7. DVT prophylaxis- anti-coverage with Coumadin  Code Status: Full code Family Communication: *Discussed with wife at bedside Disposition Plan: Home when stable   Consultants:  Neurology  Procedures:  *None  Antibiotics:  None  HPI/Subjective: *81 y.o. male with a history of a previous CVA/ICH in 2002, TIAs, Atrial Fibrillation on Coumadin Rx, HTN, Hyperlipidemia and Prostate Cancer with Metastatic Disease to the Bone who presents to the ED with complaints of diffculty speaking which occurred around 3:45 pm. His wife had noticed his garbled speech at that time. His Wife reports that the symptoms resolved but then returned at 5 pm and then the symptoms were not resolving. SHe thought he may have been having a TIA. He was brought to the ED in the next hour when his symptoms did not appear to be improving.   Patient denies any complaints this morning. Has not been able to void since last night.  Objective: Filed Vitals:   04/20/14 1100  BP: 165/77  Pulse: 77  Temp: 98.6 F (37 C)  Resp: 16    Intake/Output Summary (Last 24 hours) at 04/20/14 1246 Last data filed at 04/19/14 2052  Gross per 24 hour  Intake      0 ml  Output    100 ml  Net   -100 ml    Filed Weights   04/19/14 1956 04/19/14 2301  Weight: 88.451 kg (195 lb) 83.779 kg (184 lb 11.2 oz)    Exam:   General:  Appears in no acute distress  Cardiovascular: S1-S2 regular  Respiratory: Clear bilaterally  Abdomen: Soft nontender no organomegaly  Musculoskeletal: No edema of the lower extremities  Data Reviewed: Basic Metabolic Panel:  Recent Labs Lab 04/18/14 1208 04/19/14 1942 04/19/14 1947  NA 142 140 142  K 3.8 4.0 4.0  CL 108 103 103  CO2 28 27  --   GLUCOSE 113* 123* 120*  BUN 32* 30* 33*  CREATININE 1.24 1.42* 1.30  CALCIUM 9.5 10.0  --    Liver Function Tests:  Recent Labs Lab 04/18/14 1208 04/19/14 1942  AST 17 22  ALT 10 13  ALKPHOS 55 70  BILITOT 0.5 0.7  PROT 6.5 7.1  ALBUMIN 3.0* 3.2*   No results for input(s): LIPASE, AMYLASE in the last 168 hours. No results for input(s): AMMONIA in the last 168 hours. CBC:  Recent Labs Lab 04/18/14 1208 04/19/14 1942 04/19/14 1947  WBC 9.1 10.0  --   NEUTROABS 5.9 5.8  --   HGB 13.1 13.7 15.6  HCT 40.5 42.2 46.0  MCV 100.7* 98.4  --   PLT 204 200  --    Cardiac Enzymes: No results for input(s): CKTOTAL, CKMB, CKMBINDEX, TROPONINI in the last 168 hours. BNP (last 3 results) No results for input(s): BNP in the last 8760 hours.  ProBNP (last 3 results)  Recent Labs  02/08/14 1148  PROBNP 6362.0*  CBG:  Recent Labs Lab 04/19/14 1957  GLUCAP 123*    No results found for this or any previous visit (from the past 240 hour(s)).   Studies: Dg Chest 2 View  04/18/2014   CLINICAL DATA:  Generalized weakness, metastatic prostate cancer  EXAM: CHEST  2 VIEW  COMPARISON:  04/12/2013  FINDINGS: Cardiomediastinal silhouette is stable. No acute infiltrate or pulmonary edema. Again noted atelectasis or scarring in left base. Stable compression fracture of of T6 vertebral body. There is mild compression deformity and prior vertebroplasty at T11 level. Stable.  IMPRESSION: No acute  infiltrate or pulmonary edema. Stable left basilar atelectasis or scarring. Compression deformities thoracic spine as described above.   Electronically Signed   By: Lahoma Crocker M.D.   On: 04/18/2014 13:06   Dg Lumbar Spine Complete  04/18/2014   CLINICAL DATA:  Bilateral lower extremity weakness.  EXAM: LUMBAR SPINE - COMPLETE 4+ VIEW  COMPARISON:  None.  FINDINGS: No fracture or spondylolisthesis is noted. Status post T11 kyphoplasty. Mild degenerative disc disease is noted at L2-3, L3-4, L4-5 and L5-S1. Atherosclerotic calcifications of abdominal aorta are noted.  IMPRESSION: Mild multilevel degenerative disc disease is noted. No acute abnormality seen in the lumbar spine.   Electronically Signed   By: Marijo Conception, M.D.   On: 04/18/2014 13:11   Dg Pelvis 1-2 Views  04/18/2014   CLINICAL DATA:  Generalized weakness, metastatic prostate cancer, chemotherapy and radiation therapy  EXAM: PELVIS - 1-2 VIEW  COMPARISON:  None  FINDINGS: No pelvic fracture is noted. Stable sclerotic focus in left femoral head and left proximal femur without change from prior exam. Mild patchy sclerosis bilateral iliac bones and pubic rami, stable from prior exam. This may be due to treated metastatic disease. No destructive bony lesion is noted.  IMPRESSION: No acute fracture or subluxation. Stable sclerotic foci as described above.   Electronically Signed   By: Lahoma Crocker M.D.   On: 04/18/2014 13:11   Ct Head Wo Contrast  04/19/2014   CLINICAL DATA:  Aphasia.  Last known well at 16:00  EXAM: CT HEAD WITHOUT CONTRAST  TECHNIQUE: Contiguous axial images were obtained from the base of the skull through the vertex without intravenous contrast.  COMPARISON:  Brain MRI 02/09/2014  FINDINGS: Remote infarction in the right cerebellum.  No acute intracranial hemorrhage. No focal mass lesion. No CT evidence of acute infarction. No midline shift or mass effect. No hydrocephalus. Basilar cisterns are patent.  Extensive cortical atrophy.  Periventricular white matter hypodensities.  Paranasal sinuses and  mastoid air cells are clear.  IMPRESSION: 1. No CT evidence of acute infarction. 2. No intracranial hemorrhage. 3. Remote right cerebellar infarction. 4. Extensive atrophy and white matter microvascular disease. Findings conveyed toCamilo, MDon 04/19/2014  at19:55.   Electronically Signed   By: Suzy Bouchard M.D.   On: 04/19/2014 19:57   [name] Head Wo Contrast  04/19/2014   CLINICAL DATA:  Several episodes of expressive aphasia beginning at 3 p.m., worsening at 5 p.m. Diffuse weakness yesterday. History of metastatic prostate cancer, atrial fibrillation on Coumadin, hyperlipidemia.  EXAM: MRI HEAD WITHOUT CONTRAST  MRA HEAD WITHOUT CONTRAST  TECHNIQUE: Multiplanar, multiecho pulse sequences of the brain and surrounding structures were obtained without intravenous contrast. Angiographic images of the head were obtained using MRA technique without contrast.  COMPARISON:  CT of the head December 17, 2013 at 1940 hours and MRI of the brain February 09, 2014  FINDINGS: MRI HEAD FINDINGS  Mild to moderately motion degraded examination. No reduced diffusion to suggest acute ischemia. Stable susceptibility artifact in the mesial RIGHT cerebellum corresponding to prior infarct. Additional scattered foci of susceptibility artifact in the periphery of the cerebral more better seen on prior examination due to motion on today's study.  Remote RIGHT greater than LEFT cerebellar infarcts. Infarct extends to the RIGHT brachium pontis. Remote RIGHT ventral pontine infarct. RIGHT posterior frontal encephalomalacia most consistent with remote infarct. Tiny remote bilateral thalamic lacunar infarcts. Tiny T2 hyperintensities in the basal ganglia may reflect perivascular spaces and/or lacunar infarcts. Moderate ventriculomegaly, likely on the basis of global parenchymal brain volume loss as there is overall commensurate enlargement of cerebral sulci and cerebellar  folia. No mass effect or mass lesions. Mild to moderate white matter changes suggest chronic small vessel ischemic disease.  No abnormal extra-axial fluid collections. Status post bilateral ocular lens implants. Possible prior FESS, mild paranasal sinus mucosal thickening without air-fluid levels. Mastoid air cells are well aerated. No abnormal sellar expansion. No cerebellar tonsillar ectopia. Focal parietal scalp thinning may reflect postsurgical change, recommend direct inspection.  MRA HEAD FINDINGS  Mildly motion degraded examination.  Anterior circulation: Normal flow related enhancement of the cervical, petrous, cavernous and supraclinoid internal carotid arteries. Normal flow related enhancement of the anterior middle cerebral arteries, limited assessment distal segments due to motion. RIGHT A1 segment is dominant. Patent anterior communicating artery.  Complete loss of flow related enhancement within RIGHT vertebral artery, which was occluded on prior MRI. Irregular LEFT vertebral artery flow related enhancement, vessel is patent. Basilar artery appears widely patent. Normal flow related enhancement of proximal posterior cerebral arteries, limited assessment of distal branches due to motion.  Limited assessment for small aneurysm due to motion. No suspicious luminal irregularity.  IMPRESSION: MRI HEAD: No acute intracranial process, specifically no acute ischemia.  Remote RIGHT greater than LEFT cerebellar and RIGHT ventral pontine infarcts. Small remote RIGHT cerebellar infarct.  Involutional changes. Mild to moderate white matter changes likely represent chronic small vessel ischemic disease.  MRA HEAD: Chronic RIGHT vertebral artery occlusion. Mildly irregular LEFT vertebral artery likely reflects atherosclerosis without high-grade stenosis. No acute vascular process though mild motion degraded examination.   Electronically Signed   By: Elon Alas   On: 04/19/2014 22:40   Mr Brain Wo  Contrast  04/19/2014   CLINICAL DATA:  Several episodes of expressive aphasia beginning at 3 p.m., worsening at 5 p.m. Diffuse weakness yesterday. History of metastatic prostate cancer, atrial fibrillation on Coumadin, hyperlipidemia.  EXAM: MRI HEAD WITHOUT CONTRAST  MRA HEAD WITHOUT CONTRAST  TECHNIQUE: Multiplanar, multiecho pulse sequences of the brain and surrounding structures were obtained without intravenous contrast. Angiographic images of the head were obtained using MRA technique without contrast.  COMPARISON:  CT of the head December 17, 2013 at 1940 hours and MRI of the brain February 09, 2014  FINDINGS: MRI HEAD FINDINGS  Mild to moderately motion degraded examination. No reduced diffusion to suggest acute ischemia. Stable susceptibility artifact in the mesial RIGHT cerebellum corresponding to prior infarct. Additional scattered foci of susceptibility artifact in the periphery of the cerebral more better seen on prior examination due to motion on today's study.  Remote RIGHT greater than LEFT cerebellar infarcts. Infarct extends to the RIGHT brachium pontis. Remote RIGHT ventral pontine infarct. RIGHT posterior frontal encephalomalacia most consistent with remote infarct. Tiny remote bilateral thalamic lacunar infarcts. Tiny T2 hyperintensities in the basal ganglia may reflect perivascular spaces and/or lacunar infarcts. Moderate ventriculomegaly, likely on  the basis of global parenchymal brain volume loss as there is overall commensurate enlargement of cerebral sulci and cerebellar folia. No mass effect or mass lesions. Mild to moderate white matter changes suggest chronic small vessel ischemic disease.  No abnormal extra-axial fluid collections. Status post bilateral ocular lens implants. Possible prior FESS, mild paranasal sinus mucosal thickening without air-fluid levels. Mastoid air cells are well aerated. No abnormal sellar expansion. No cerebellar tonsillar ectopia. Focal parietal scalp thinning  may reflect postsurgical change, recommend direct inspection.  MRA HEAD FINDINGS  Mildly motion degraded examination.  Anterior circulation: Normal flow related enhancement of the cervical, petrous, cavernous and supraclinoid internal carotid arteries. Normal flow related enhancement of the anterior middle cerebral arteries, limited assessment distal segments due to motion. RIGHT A1 segment is dominant. Patent anterior communicating artery.  Complete loss of flow related enhancement within RIGHT vertebral artery, which was occluded on prior MRI. Irregular LEFT vertebral artery flow related enhancement, vessel is patent. Basilar artery appears widely patent. Normal flow related enhancement of proximal posterior cerebral arteries, limited assessment of distal branches due to motion.  Limited assessment for small aneurysm due to motion. No suspicious luminal irregularity.  IMPRESSION: MRI HEAD: No acute intracranial process, specifically no acute ischemia.  Remote RIGHT greater than LEFT cerebellar and RIGHT ventral pontine infarcts. Small remote RIGHT cerebellar infarct.  Involutional changes. Mild to moderate white matter changes likely represent chronic small vessel ischemic disease.  MRA HEAD: Chronic RIGHT vertebral artery occlusion. Mildly irregular LEFT vertebral artery likely reflects atherosclerosis without high-grade stenosis. No acute vascular process though mild motion degraded examination.   Electronically Signed   By: Elon Alas   On: 04/19/2014 22:40   Dg Foot Complete Right  04/18/2014   CLINICAL DATA:  Generalized weakness, prostate cancer metastatic to bone being tree with radiation and chemotherapy, weakness in legs beginning this morning, unable to stand, acute onset of BILATERAL leg pain at the femora, generalized new RIGHT foot pain  EXAM: RIGHT FOOT COMPLETE - 3+ VIEW  COMPARISON:  None  FINDINGS: Mild osseous demineralization.  Joint spaces preserved.  Small plantar calcaneal spur.   Small sclerotic focus within the mid tarsal navicular favoring bone island at this site.  No acute fracture, dislocation or bone destruction.  IMPRESSION: No definite acute osseous abnormalities.  Calcaneal spurring.  Probable bone island within tarsal navicular.   Electronically Signed   By: Lavonia Dana M.D.   On: 04/18/2014 13:12   Dg Femur Min 2 Views Left  04/18/2014   CLINICAL DATA:  Generalized weakness, bilateral leg pain, metastatic prostate cancer  EXAM: LEFT FEMUR 2 VIEWS  COMPARISON:  02/09/2014  FINDINGS: Four views of the left femur submitted. No acute fracture or subluxation. No destructive bony lesion. Stable sclerotic focus in left femoral head and left proximal femur without change from prior exam. This may be due to bone islands or treated metastasis.  IMPRESSION: Negative.   Electronically Signed   By: Lahoma Crocker M.D.   On: 04/18/2014 13:13   Dg Femur, Min 2 Views Right  04/18/2014   CLINICAL DATA:  Acute bilateral femur pain.  EXAM: RIGHT FEMUR 2 VIEWS  COMPARISON:  None.  FINDINGS: There is no evidence of fracture or other focal bone lesions. Soft tissues are unremarkable.  IMPRESSION: Normal right femur.   Electronically Signed   By: Marijo Conception, M.D.   On: 04/18/2014 13:08    Scheduled Meds: . amLODipine  5 mg Oral Daily  . aspirin  300 mg Rectal Daily   Or  . aspirin  325 mg Oral Daily  . atorvastatin  10 mg Oral Daily  . B-complex with vitamin C  1 tablet Oral Daily  . darifenacin  7.5 mg Oral Daily  . docusate sodium  100 mg Oral BID  . ezetimibe  10 mg Oral Daily  . furosemide  40 mg Oral Daily  . losartan  100 mg Oral Daily  . multivitamin with minerals  1 tablet Oral Daily  . omega-3 acid ethyl esters  1 g Oral Daily  . pantoprazole  40 mg Oral Daily  . tamsulosin  0.4 mg Oral Daily  . warfarin  2.5 mg Oral Once per day on Sun Tue Thu Fri Sat  . [START ON 04/22/2014] warfarin  5 mg Oral Once per day on Mon Wed  . Warfarin - Pharmacist Dosing Inpatient   Does  not apply q1800   Continuous Infusions:   Principal Problem:   CVA (cerebral infarction) Active Problems:   Chronic atrial fibrillation   Hypercholesterolemia   Prostate cancer metastatic to bone   Chronic kidney disease (CKD), stage III (moderate)   Chronic diastolic heart failure   Essential hypertension   History of stroke   Chronic back pain    Time spent: *25 minutes    [facility] Pager [phone]. If 7PM-7AM, please contact night-coverage at [email or web address], password [facility] 04/20/2014, 12:46 PM

## 2014-04-21 DIAGNOSIS — R471 Dysarthria and anarthria: Secondary | ICD-10-CM

## 2014-04-21 LAB — URINALYSIS, ROUTINE W REFLEX MICROSCOPIC
Glucose, UA: NEGATIVE mg/dL
Ketones, ur: 15 mg/dL — AB
NITRITE: POSITIVE — AB
PH: 5.5 (ref 5.0–8.0)
Protein, ur: 100 mg/dL — AB
Specific Gravity, Urine: 1.026 (ref 1.005–1.030)
UROBILINOGEN UA: 1 mg/dL (ref 0.0–1.0)

## 2014-04-21 LAB — URINE MICROSCOPIC-ADD ON

## 2014-04-21 LAB — LIPID PANEL
CHOL/HDL RATIO: 3.2 ratio
CHOLESTEROL: 130 mg/dL (ref 0–200)
HDL: 41 mg/dL (ref >39)
LDL Cholesterol: 70 mg/dL (ref 0–99)
TRIGLYCERIDES: 97 mg/dL (ref <150)
VLDL: 19 mg/dL (ref 0–40)

## 2014-04-21 LAB — CBC
HCT: 40.3 % (ref 39.0–52.0)
HEMOGLOBIN: 13.3 g/dL (ref 13.0–17.0)
MCH: 32.1 pg (ref 26.0–34.0)
MCHC: 33 g/dL (ref 30.0–36.0)
MCV: 97.3 fL (ref 78.0–100.0)
Platelets: 201 10*3/uL (ref 150–400)
RBC: 4.14 MIL/uL — AB (ref 4.22–5.81)
RDW: 14.6 % (ref 11.5–15.5)
WBC: 13.2 10*3/uL — AB (ref 4.0–10.5)

## 2014-04-21 LAB — BASIC METABOLIC PANEL
ANION GAP: 7 (ref 5–15)
BUN: 26 mg/dL — AB (ref 6–23)
CHLORIDE: 106 mmol/L (ref 96–112)
CO2: 25 mmol/L (ref 19–32)
Calcium: 9.4 mg/dL (ref 8.4–10.5)
Creatinine, Ser: 1.26 mg/dL (ref 0.50–1.35)
GFR calc Af Amer: 57 mL/min — ABNORMAL LOW (ref >90)
GFR, EST NON AFRICAN AMERICAN: 49 mL/min — AB (ref >90)
GLUCOSE: 116 mg/dL — AB (ref 70–99)
Potassium: 4 mmol/L (ref 3.5–5.1)
SODIUM: 138 mmol/L (ref 135–145)

## 2014-04-21 LAB — PROTIME-INR
INR: 1.7 — ABNORMAL HIGH (ref 0.00–1.49)
Prothrombin Time: 20.2 seconds — ABNORMAL HIGH (ref 11.6–15.2)

## 2014-04-21 LAB — GASTRIC OCCULT BLOOD (1-CARD TO LAB): Occult Blood, Gastric: POSITIVE — AB

## 2014-04-21 MED ORDER — WARFARIN SODIUM 5 MG PO TABS
5.0000 mg | ORAL_TABLET | Freq: Once | ORAL | Status: AC
  Administered 2014-04-21: 5 mg via ORAL
  Filled 2014-04-21: qty 1

## 2014-04-21 MED ORDER — CEFTRIAXONE SODIUM IN DEXTROSE 20 MG/ML IV SOLN
1.0000 g | INTRAVENOUS | Status: DC
  Administered 2014-04-21 – 2014-04-23 (×3): 1 g via INTRAVENOUS
  Filled 2014-04-21 (×4): qty 50

## 2014-04-21 MED ORDER — HYDRALAZINE HCL 25 MG PO TABS
25.0000 mg | ORAL_TABLET | Freq: Four times a day (QID) | ORAL | Status: DC | PRN
  Administered 2014-04-22: 25 mg via ORAL
  Filled 2014-04-21: qty 1

## 2014-04-21 MED ORDER — ACETAMINOPHEN 325 MG PO TABS
650.0000 mg | ORAL_TABLET | Freq: Four times a day (QID) | ORAL | Status: DC | PRN
  Administered 2014-04-21 – 2014-04-23 (×4): 650 mg via ORAL
  Filled 2014-04-21 (×4): qty 2

## 2014-04-21 NOTE — Evaluation (Signed)
Occupational Therapy Evaluation Patient Details Name: Craig [name]. MRN: [id number] DOB: [birth year] Today's Date: 04/21/2014    History of Present Illness Pt is an 81 y/o male with a history of a previous CVA/ICH in 2002, TIA's, A-fib, HTN, hyperlipidemia and prostate cancer with metastatic disease to the bone who presents tot he ED with complaints of difficulty speaking which occured around 3:45pm. His wife had noticed his garbled speech at that time. His wife reports to the ED that the symptoms resolved but then returned at [place] and did not resolve. He was brought to the ED in the next hour when his symptoms did not appear to be improving. CT and MRI acutely unremarkable.   Clinical Impression   Pt admitted with above. Pt requiring assist with ADLs prior to admission. Pt requiring heavy assist for mobility. Recommending SNF for rehab as I do not feel wife could manage him at home at this time. Wife requesting [facility].     Follow Up Recommendations  SNF;Supervision/Assistance - 24 hour    Equipment Recommendations  Other (comment) (defer to next venue)    Recommendations for Other Services       Precautions / Restrictions Precautions Precautions: Fall Restrictions Weight Bearing Restrictions: No      Mobility Bed Mobility Overal bed mobility: Needs Assistance Bed Mobility: Supine to Sit;Sit to Supine     Supine to sit: Max assist Sit to supine: Mod assist   General bed mobility comments: assist with legs and trunk to get to EOB.  Cues for technique. [place] position used for scooting HOB and assist given as well. Assist to scoot hips to EOB as well.  Transfers Overall transfer level: Needs assistance Equipment used: Rolling walker (2 wheeled) Transfers: Sit to/from Stand Sit to Stand: Max assist;+2 physical assistance;+2 safety/equipment;From elevated surface         General transfer comment: practiced sit <> stand transfer a few times. Pt having  difficulty straightening legs out. Cues for hand placement.          ADL Overall ADL's : Needs assistance/impaired     Grooming: Set up;Sitting;Supervision/safety       Lower Body Bathing: Maximal assistance;Sitting/lateral leans       Lower Body Dressing: Total assistance;Sitting/lateral leans   Toilet Transfer: Maximal assistance;+2 for physical assistance;+2 for safety/equipment (sit to stand from elevated bed)           Functional mobility during ADLs: Maximal assistance;+2 for physical assistance;+2 for safety/equipment (sit to stand from bed) General ADL Comments: Discussed d/c recommendation and briefly explained CIR as that is what wife's first choice was for pt. Explained tub transfer using tub bench.     Vision  Pt wears glasses   Perception     Praxis      Pertinent Vitals/Pain Pain Assessment: Faces Faces Pain Scale: Hurts even more Pain Location: legs Pain Descriptors / Indicators: Other (Comment) (yelling out ) Pain Intervention(s): Repositioned;Monitored during session;Other (comment) (notified nurse)     Hand Dominance Right   Extremity/Trunk Assessment Upper Extremity Assessment Upper Extremity Assessment: Generalized weakness   Lower Extremity Assessment Lower Extremity Assessment: Defer to PT evaluation       Communication Communication Communication: HOH   Cognition Arousal/Alertness: Awake/alert Behavior During Therapy: WFL for tasks assessed/performed Overall Cognitive Status:  (unsure of baseline) Area of Impairment: Following commands       Following Commands: Follows one step commands with increased time           General  Comments       Exercises       Shoulder Instructions      Home Living Family/patient expects to be discharged to:: Private residence Living Arrangements: Spouse/significant other Available Help at Discharge: Family Type of Home: House Home Access: Level entry     Home Layout: Two  level Alternate Level Stairs-Number of Steps: 14 Alternate Level Stairs-Rails: Right Bathroom Shower/Tub: Walk-in shower;Tub/shower unit         Home Equipment: Shower seat - built in;Walker - 2 wheels;Grab bars - tub/shower;Walker - 4 wheels;Shower seat;Hand held shower head;Bedside commode;Tub bench (chair lift; unsure if they have tub bench)          Prior Functioning/Environment Level of Independence: Needs assistance  Gait / Transfers Assistance Needed: assist with standing, some assist for mobility, more recently required increased assist.  ADL's / Homemaking Assistance Needed: Wife assists with all ADL's - per wife he is mostly dependent for bathing and dressing.         OT Diagnosis: Acute pain;Generalized weakness   OT Problem List: Decreased strength;Decreased activity tolerance;Decreased knowledge of precautions;Decreased knowledge of use of DME or AE;Pain   OT Treatment/Interventions: Self-care/ADL training;Therapeutic exercise;DME and/or AE instruction;Therapeutic activities;Patient/family education;Balance training;Cognitive remediation/compensation    OT Goals(Current goals can be found in the care plan section) Acute Rehab OT Goals Patient Stated Goal: not stated OT Goal Formulation: With patient/family Time For Goal Achievement: 04/28/14 Potential to Achieve Goals: Fair ADL Goals Pt Will Perform Grooming: with set-up;with supervision;sitting (sitting EOB) Pt Will Perform Upper Body Bathing: with min assist;sitting Pt Will Perform Lower Body Bathing: bed level;sitting/lateral leans;with caregiver independent in assisting;with mod assist Pt Will Transfer to Toilet: stand pivot transfer;with mod assist;bedside commode Additional ADL Goal #1: Pt will perform HEP for bilateral UE's to increase strength at supervision level.  OT Frequency: Min 2X/week   Barriers to D/C:            Co-evaluation              End of Session Equipment Utilized During  Treatment: Gait belt;Rolling walker Nurse Communication: Other (comment) (pain in legs; d/c recommendation)  Activity Tolerance: Patient limited by fatigue;Patient limited by pain Patient left: in bed;with call bell/phone within reach;with bed alarm set;with family/visitor present;Other (comment) (lab getting blood)   Time: 3790-2409 OT Time Calculation (min): 20 min Charges:  OT General Charges $OT Visit: 1 Procedure OT Evaluation $Initial OT Evaluation Tier I: 1 Procedure G-Codes: OT G-codes **NOT FOR INPATIENT CLASS** Functional Assessment Tool Used: clinical judgment Functional Limitation: Self care Self Care Current Status (B3532): At least 60 percent but less than 80 percent impaired, limited or restricted Self Care Goal Status (D9242): At least 20 percent but less than 40 percent impaired, limited or restricted  Thales Knipple L 04/21/2014, 12:09 PM

## 2014-04-21 NOTE — Progress Notes (Signed)
Patient's daughter reports episode X1 of vomitting. Linen soiled with brown emesis. Patient neurologically unchanged from yesterday. Patient denies nausea, states " I drank some...tea, it just came up." MD notified per Amion. MD arrived at patients room at 0751.

## 2014-04-21 NOTE — Progress Notes (Signed)
STROKE TEAM PROGRESS NOTE   HISTORY Craig Koren. is an 81 y.o. male with a past medical history significant for hyperlipidemia, HTN, atrial fibrillation on coumadin, ischemic infarct s/p IV tpa complicated by ICH, migraines, prostate cancer metastatic to bone, brought in via EMS as a code stroke due to acute onset of transient language impairment. Wife is at the bedside and said that he was home siting in a recliner watching TV when she noticed that his speech was very garbled and he was little confused for a couple of minutes. She did not noticed any other changes but called EMS and reportedly patient had several episodes of transient language impairment since being evaluated by EMS. There is not reported HA, vertigo, double vision, focal arm/legs weakness, or visual disturbances. NIHSS 2. CT brain showed no acute abnormality. INR 1.84   Date last known well: 04/19/13 Time last known well: 3:45 tPA Given: no, out of the window, minimal deficits,previous post IV tpa ICH, coumadin with INR NIHSS: 2 MRS: 0     SUBJECTIVE (INTERVAL HISTORY) The patient's wife is at the bedside. 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 He is feeling better now. The patient is alert and oriented to person and place. He did not know the date or year. He was able to perform some simple math calculations and his speech appears to be better today.  The patient developed some hematuria and is being worked up for a UTI. His warfarin was held yesterday and his INR is again subtherapeutic today at 1.7.    OBJECTIVE Temp:  [97.6 F (36.4 C)-99.1 F (37.3 C)] 97.9 F (36.6 C) (02/28 0544) Pulse Rate:  [77-92] 92 (02/28 0544) Cardiac Rhythm:  [-] Atrial fibrillation (02/27 2013) Resp:  [16-20] 20 (02/28 0544) BP: (117-184)/(56-94) 184/79 mmHg (02/28 0554) SpO2:  [95 %-99 %] 96 % (02/28 0544)   Recent Labs Lab 04/19/14 1957  GLUCAP 123*    Recent Labs Lab  04/18/14 1208 04/19/14 1942 04/19/14 1947 04/21/14 0512  NA 142 140 142 138  K 3.8 4.0 4.0 4.0  CL 108 103 103 106  CO2 28 27  --  25  GLUCOSE 113* 123* 120* 116*  BUN 32* 30* 33* 26*  CREATININE 1.24 1.42* 1.30 1.26  CALCIUM 9.5 10.0  --  9.4    Recent Labs Lab 04/18/14 1208 04/19/14 1942  AST 17 22  ALT 10 13  ALKPHOS 55 70  BILITOT 0.5 0.7  PROT 6.5 7.1  ALBUMIN 3.0* 3.2*    Recent Labs Lab 04/18/14 1208 04/19/14 1942 04/19/14 1947  WBC 9.1 10.0  --   NEUTROABS 5.9 5.8  --   HGB 13.1 13.7 15.6  HCT 40.5 42.2 46.0  MCV 100.7* 98.4  --   PLT 204 200  --    No results for input(s): CKTOTAL, CKMB, CKMBINDEX, TROPONINI in the last 168 hours.  Recent Labs  04/18/14 1208 04/19/14 1942 04/20/14 0610 04/21/14 0512  LABPROT 22.6* 21.5* 21.2* 20.2*  INR 1.97* 1.84* 1.82* 1.70*    Recent Labs  04/18/14 1147 04/19/14 2051  COLORURINE YELLOW YELLOW  LABSPEC 1.014 1.013  PHURINE 6.5 6.5  GLUCOSEU NEGATIVE NEGATIVE  HGBUR NEGATIVE SMALL*  BILIRUBINUR NEGATIVE NEGATIVE  KETONESUR NEGATIVE NEGATIVE  PROTEINUR NEGATIVE NEGATIVE  UROBILINOGEN 0.2 1.0  NITRITE NEGATIVE NEGATIVE  LEUKOCYTESUR NEGATIVE NEGATIVE       Component Value Date/Time   CHOL 130 04/21/2014 0512   TRIG 97 04/21/2014 0512  HDL 41 04/21/2014 0512   CHOLHDL 3.2 04/21/2014 0512   VLDL 19 04/21/2014 0512   LDLCALC 70 04/21/2014 0512   Lab Results  Component Value Date   HGBA1C 5.6 09/27/2012      Component Value Date/Time   LABOPIA NONE DETECTED 04/19/2014 2051   COCAINSCRNUR NONE DETECTED 04/19/2014 2051   LABBENZ NONE DETECTED 04/19/2014 2051   AMPHETMU NONE DETECTED 04/19/2014 2051   THCU NONE DETECTED 04/19/2014 2051   LABBARB NONE DETECTED 04/19/2014 2051     Recent Labs Lab 04/19/14 1942  ETH <5    Dg Chest 2 View 04/18/2014    No acute infiltrate or pulmonary edema. Stable left basilar atelectasis or scarring. Compression deformities thoracic spine as described  above.     Dg Lumbar Spine Complete 04/18/2014    Mild multilevel degenerative disc disease is noted. No acute abnormality seen in the lumbar spine.      Dg Pelvis 1-2 Views 04/18/2014    No acute fracture or subluxation. Stable sclerotic foci as described above.       Ct Head Wo Contrast 04/19/2014    1. No CT evidence of acute infarction.  2. No intracranial hemorrhage.  3. Remote right cerebellar infarction.  4. Extensive atrophy and white matter microvascular disease.   Mr Craig [name] Head Wo Contrast 04/19/2014     MRI HEAD:  No acute intracranial process, specifically no acute ischemia.  Remote RIGHT greater than LEFT cerebellar and RIGHT ventral pontine infarcts. Small remote RIGHT cerebellar infarct.  Involutional changes. Mild to moderate white matter changes likely represent chronic small vessel ischemic disease.    MRA HEAD:  Chronic RIGHT vertebral artery occlusion. Mildly irregular LEFT vertebral artery likely reflects atherosclerosis without high-grade stenosis. No acute vascular process though mild motion degraded examination.        Dg Foot Complete Right 04/18/2014    No definite acute osseous abnormalities.  Calcaneal spurring.  Probable bone island within tarsal navicular.      Dg Femur Min 2 Views Left 04/18/2014    Negative.     Dg Femur, Min 2 Views Right 04/18/2014    Normal right femur.       PHYSICAL EXAM Mental Status: Speech is clearer today. The patient is oriented to person and place but not date. He is alert and able to perform simple math calculations.  Able to follow 3 step commands without difficulty. Cranial Nerves: II: Visual fields grossly normal, pupils equal, round, reactive to light and accommodation III,IV, VI: ptosis not present, extra-ocular motions intact bilaterally V,VII: smile asymmetric VIII: hearing normal bilaterally IX,X: gag reflex present XI: bilateral shoulder shrug XII: midline tongue extension without atrophy or  fasciculations Motor: Right :Upper extremity 5/5Left: Upper extremity 5/5 Lower extremity 5/5Lower extremity 5/5 Tone and bulk:normal tone throughout; no atrophy noted Sensory: Light touch intact throughout, bilaterally Deep Tendon Reflexes:  2/4 throughout Cerebellar: normal finger-to-nose with tremor Gait:  Deferred for safety reasons    ASSESSMENT/PLAN 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 He did not receive IV t-PA due to late presentation.  Possible TIA:  Dominant secondary to atrial fibrillation with subtherapeutic warfarin level.  Resultant mild confusion  MRI  negative for acute stroke as above.  MRA chronic right vertebral artery occlusion.  Carotid Doppler 1-39% ICA  stenosis. Right vertebral artery flow is antegrade. Left vertebral artery flow not insonated.  2D Echo  pending  LDL 70  HgbA1c pending  Warfarin for VTE prophylaxis  Diet clear liquid with thin liquids  warfarin prior to admission, now on warfarin  Ongoing aggressive stroke risk factor management  Therapy recommendations:  Home health physical therapy recommended  Disposition:  Pending  Hypertension  Home meds:  Norvasc 5 mg daily and Cozaar 100 mg daily  Blood pressure mildly high  Hyperlipidemia  Home meds:  Lipitor 10 mg daily resumed in hospital  LDL 70 , goal < 70  Continue statin at discharge   Other Stroke Risk Factors  Advanced age  Hx stroke/TIA  Atrial fibrillation  Migraine headaches   Other Active Problems  Mildly renal insufficiency - possible dehydration  Other Pertinent  History  Prostate cancer with bone metastasis  PLAN  Pharmacy dosing warfarin - INR 1.70  Today. Yesterday's dose was held secondary to hematuria.  2-D echo pending  Hemoglobin A1c pending  Right vertebral artery occluded - irregularities of the left vertebral artery but no significant stenosis.  Home health physical therapy.   Hospital day #   Mikey Bussing PA-C [facility] Pager [phone] 04/21/2014, 8:35 AM   I examined the patient along with reviewing images, laboratory work up and plan development.  Leotis Pain  To contact Stroke Continuity provider, please refer to [email or web address] After hours, contact General Neurology

## 2014-04-21 NOTE — Progress Notes (Signed)
Patient's R knee noted to be swollen with discomfort on movement.

## 2014-04-21 NOTE — Progress Notes (Addendum)
ANTICOAGULATION CONSULT NOTE - Follow-up Consult  Pharmacy Consult for Coumadin Indication: atrial fibrillation  Allergies  Allergen Reactions  . Pravachol     Muscle weakness  . Zocor [Simvastatin - High Dose]     Muscle weakness    Patient Measurements: Height: 6' (182.9 cm) Weight: 184 lb 11.2 oz (83.779 kg) IBW/kg (Calculated) : 77.6  Vital Signs: Temp: 97.6 F (36.4 C) (02/28 0954) Temp Source: Oral (02/28 0954) BP: 139/59 mmHg (02/28 0954) Pulse Rate: 82 (02/28 0954)  Labs:  Recent Labs  04/18/14 1208 04/19/14 1942 04/19/14 1947 04/20/14 0610 04/21/14 0512  HGB 13.1 13.7 15.6  --   --   HCT 40.5 42.2 46.0  --   --   PLT 204 200  --   --   --   APTT  --  38*  --   --   --   LABPROT 22.6* 21.5*  --  21.2* 20.2*  INR 1.97* 1.84*  --  1.82* 1.70*  CREATININE 1.24 1.42* 1.30  --  1.26    Estimated Creatinine Clearance: 45.3 mL/min (by C-G formula based on Cr of 1.26).  Assessment: 81 y.o. male presents with multiple episodes of expressive aphasia - h/o CVA. Pt on coumadin PTA for afib. INR remains subtherapeutic at 1.7. Dose held 2/27 due to mild bleeding at foley cath site - had resolved this a.m. But RN reports some bleeding in tube when turned pt a couple of hours ago. Rn to f/u with Dr. Darrick Meigs to see if he is ok with giving coumadin if bleeding continues. Home dose: 2.5mg  daily except for 5mg  on Mon and Wed  Goal of Therapy:  INR 2-3 Monitor platelets by anticoagulation protocol: Yes   Plan:  -warfarin 5mg  tonight -daily INR -follow for further bleeding  Sherlon Handing, PharmD, BCPS Clinical pharmacist, pager [phone] 04/21/2014,11:44 AM

## 2014-04-21 NOTE — Progress Notes (Signed)
[facility] PROGRESS NOTE  Craig [name]. WNU:[id number] DOB: June 19, 1924 DOA: 04/19/2014 PCP: [name], MD  Assessment/Plan: 1. Dysarthria- resolved, MRI head is negative for stroke. Neurology is following. Continue warfarin, will discontinue aspirin. Will check neurochecks every 4 hours. 2. Urinary retention- will check bladder scan, if urinary retention will insert Foley catheter. Also start Flomax 0.4 mg daily. 3. Hematuria- patient had mild hematuria yesterday, which has been resolved at this time. Warfarin was held last night. Will discuss with urology in a.m. 4. Vomiting- patient had one abuse or vomiting this morning after he drank tea. Question of brown colored vomitus, will obtain gastric occult blood. 5. Prostate cancer with metastasis- patient is currently off Xtandi as per wife due to weakness of the lower extremities. Will follow-up as outpatient with Dr. Alen Blew. 6. Chronic diastolic CHF- currently compensated 7. Essential hypertension- continue losartan and Lasix 8. Chronic atrial fibrillation- heart rate controlled, continue Coumadin per pharmacy consultation 9. DVT prophylaxis- anti-coagulation with Coumadin  Code Status: Full code Family Communication: *Discussed with wife at bedside Disposition Plan: Home when stable   Consultants:  Neurology  Procedures:  *None  Antibiotics:  None  HPI/Subjective: *81 y.o. male with a history of a previous CVA/ICH in 2002, TIAs, Atrial Fibrillation on Coumadin Rx, HTN, Hyperlipidemia and Prostate Cancer with Metastatic Disease to the Bone who presents to the ED with complaints of diffculty speaking which occurred around 3:45 pm. His wife had noticed his garbled speech at that time. His Wife reports that the symptoms resolved but then returned at 5 pm and then the symptoms were not resolving. SHe thought he may have been having a TIA. He was brought to the ED in the next hour when his symptoms did not  appear to be improving.   Patient had one episode of vomiting this morning. Asymptomatic denies any abdominal pain  Objective: Filed Vitals:   04/21/14 0954  BP: 139/59  Pulse: 82  Temp: 97.6 F (36.4 C)  Resp: 20    Intake/Output Summary (Last 24 hours) at 04/21/14 1113 Last data filed at 04/21/14 0559  Gross per 24 hour  Intake    120 ml  Output    725 ml  Net   -605 ml   Filed Weights   04/19/14 1956 04/19/14 2301  Weight: 88.451 kg (195 lb) 83.779 kg (184 lb 11.2 oz)    Exam:   General:  Appears in no acute distress  Cardiovascular: S1-S2 regular  Respiratory: Clear bilaterally  Abdomen: Soft nontender no organomegaly  Musculoskeletal: No edema of the lower extremities  Data Reviewed: Basic Metabolic Panel:  Recent Labs Lab 04/18/14 1208 04/19/14 1942 04/19/14 1947 04/21/14 0512  NA 142 140 142 138  K 3.8 4.0 4.0 4.0  CL 108 103 103 106  CO2 28 27  --  25  GLUCOSE 113* 123* 120* 116*  BUN 32* 30* 33* 26*  CREATININE 1.24 1.42* 1.30 1.26  CALCIUM 9.5 10.0  --  9.4   Liver Function Tests:  Recent Labs Lab 04/18/14 1208 04/19/14 1942  AST 17 22  ALT 10 13  ALKPHOS 55 70  BILITOT 0.5 0.7  PROT 6.5 7.1  ALBUMIN 3.0* 3.2*   No results for input(s): LIPASE, AMYLASE in the last 168 hours. No results for input(s): AMMONIA in the last 168 hours. CBC:  Recent Labs Lab 04/18/14 1208 04/19/14 1942 04/19/14 1947  WBC 9.1 10.0  --   NEUTROABS 5.9 5.8  --   HGB 13.1  13.7 15.6  HCT 40.5 42.2 46.0  MCV 100.7* 98.4  --   PLT 204 200  --    Cardiac Enzymes: No results for input(s): CKTOTAL, CKMB, CKMBINDEX, TROPONINI in the last 168 hours. BNP (last 3 results) No results for input(s): BNP in the last 8760 hours.  ProBNP (last 3 results)  Recent Labs  02/08/14 1148  PROBNP 6362.0*    CBG:  Recent Labs Lab 04/19/14 1957  GLUCAP 123*    No results found for this or any previous visit (from the past 240 hour(s)).    Studies: Ct Head Wo Contrast  04/19/2014   CLINICAL DATA:  Aphasia.  Last known well at 16:00  EXAM: CT HEAD WITHOUT CONTRAST  TECHNIQUE: Contiguous axial images were obtained from the base of the skull through the vertex without intravenous contrast.  COMPARISON:  Brain MRI 02/09/2014  FINDINGS: Remote infarction in the right cerebellum.  No acute intracranial hemorrhage. No focal mass lesion. No CT evidence of acute infarction. No midline shift or mass effect. No hydrocephalus. Basilar cisterns are patent.  Extensive cortical atrophy. Periventricular white matter hypodensities.  Paranasal sinuses and  mastoid air cells are clear.  IMPRESSION: 1. No CT evidence of acute infarction. 2. No intracranial hemorrhage. 3. Remote right cerebellar infarction. 4. Extensive atrophy and white matter microvascular disease. Findings conveyed toCamilo, MDon 04/19/2014  at19:55.   Electronically Signed   By: Suzy Bouchard M.D.   On: 04/19/2014 19:57   [name] Head Wo Contrast  04/19/2014   CLINICAL DATA:  Several episodes of expressive aphasia beginning at 3 p.m., worsening at 5 p.m. Diffuse weakness yesterday. History of metastatic prostate cancer, atrial fibrillation on Coumadin, hyperlipidemia.  EXAM: MRI HEAD WITHOUT CONTRAST  MRA HEAD WITHOUT CONTRAST  TECHNIQUE: Multiplanar, multiecho pulse sequences of the brain and surrounding structures were obtained without intravenous contrast. Angiographic images of the head were obtained using MRA technique without contrast.  COMPARISON:  CT of the head December 17, 2013 at 1940 hours and MRI of the brain February 09, 2014  FINDINGS: MRI HEAD FINDINGS  Mild to moderately motion degraded examination. No reduced diffusion to suggest acute ischemia. Stable susceptibility artifact in the mesial RIGHT cerebellum corresponding to prior infarct. Additional scattered foci of susceptibility artifact in the periphery of the cerebral more better seen on prior examination due to motion  on today's study.  Remote RIGHT greater than LEFT cerebellar infarcts. Infarct extends to the RIGHT brachium pontis. Remote RIGHT ventral pontine infarct. RIGHT posterior frontal encephalomalacia most consistent with remote infarct. Tiny remote bilateral thalamic lacunar infarcts. Tiny T2 hyperintensities in the basal ganglia may reflect perivascular spaces and/or lacunar infarcts. Moderate ventriculomegaly, likely on the basis of global parenchymal brain volume loss as there is overall commensurate enlargement of cerebral sulci and cerebellar folia. No mass effect or mass lesions. Mild to moderate white matter changes suggest chronic small vessel ischemic disease.  No abnormal extra-axial fluid collections. Status post bilateral ocular lens implants. Possible prior FESS, mild paranasal sinus mucosal thickening without air-fluid levels. Mastoid air cells are well aerated. No abnormal sellar expansion. No cerebellar tonsillar ectopia. Focal parietal scalp thinning may reflect postsurgical change, recommend direct inspection.  MRA HEAD FINDINGS  Mildly motion degraded examination.  Anterior circulation: Normal flow related enhancement of the cervical, petrous, cavernous and supraclinoid internal carotid arteries. Normal flow related enhancement of the anterior middle cerebral arteries, limited assessment distal segments due to motion. RIGHT A1 segment is dominant. Patent anterior communicating artery.  Complete loss of flow related enhancement within RIGHT vertebral artery, which was occluded on prior MRI. Irregular LEFT vertebral artery flow related enhancement, vessel is patent. Basilar artery appears widely patent. Normal flow related enhancement of proximal posterior cerebral arteries, limited assessment of distal branches due to motion.  Limited assessment for small aneurysm due to motion. No suspicious luminal irregularity.  IMPRESSION: MRI HEAD: No acute intracranial process, specifically no acute ischemia.   Remote RIGHT greater than LEFT cerebellar and RIGHT ventral pontine infarcts. Small remote RIGHT cerebellar infarct.  Involutional changes. Mild to moderate white matter changes likely represent chronic small vessel ischemic disease.  MRA HEAD: Chronic RIGHT vertebral artery occlusion. Mildly irregular LEFT vertebral artery likely reflects atherosclerosis without high-grade stenosis. No acute vascular process though mild motion degraded examination.   Electronically Signed   By: Elon Alas   On: 04/19/2014 22:40   Mr Brain Wo Contrast  04/19/2014   CLINICAL DATA:  Several episodes of expressive aphasia beginning at 3 p.m., worsening at 5 p.m. Diffuse weakness yesterday. History of metastatic prostate cancer, atrial fibrillation on Coumadin, hyperlipidemia.  EXAM: MRI HEAD WITHOUT CONTRAST  MRA HEAD WITHOUT CONTRAST  TECHNIQUE: Multiplanar, multiecho pulse sequences of the brain and surrounding structures were obtained without intravenous contrast. Angiographic images of the head were obtained using MRA technique without contrast.  COMPARISON:  CT of the head December 17, 2013 at 1940 hours and MRI of the brain February 09, 2014  FINDINGS: MRI HEAD FINDINGS  Mild to moderately motion degraded examination. No reduced diffusion to suggest acute ischemia. Stable susceptibility artifact in the mesial RIGHT cerebellum corresponding to prior infarct. Additional scattered foci of susceptibility artifact in the periphery of the cerebral more better seen on prior examination due to motion on today's study.  Remote RIGHT greater than LEFT cerebellar infarcts. Infarct extends to the RIGHT brachium pontis. Remote RIGHT ventral pontine infarct. RIGHT posterior frontal encephalomalacia most consistent with remote infarct. Tiny remote bilateral thalamic lacunar infarcts. Tiny T2 hyperintensities in the basal ganglia may reflect perivascular spaces and/or lacunar infarcts. Moderate ventriculomegaly, likely on the basis of  global parenchymal brain volume loss as there is overall commensurate enlargement of cerebral sulci and cerebellar folia. No mass effect or mass lesions. Mild to moderate white matter changes suggest chronic small vessel ischemic disease.  No abnormal extra-axial fluid collections. Status post bilateral ocular lens implants. Possible prior FESS, mild paranasal sinus mucosal thickening without air-fluid levels. Mastoid air cells are well aerated. No abnormal sellar expansion. No cerebellar tonsillar ectopia. Focal parietal scalp thinning may reflect postsurgical change, recommend direct inspection.  MRA HEAD FINDINGS  Mildly motion degraded examination.  Anterior circulation: Normal flow related enhancement of the cervical, petrous, cavernous and supraclinoid internal carotid arteries. Normal flow related enhancement of the anterior middle cerebral arteries, limited assessment distal segments due to motion. RIGHT A1 segment is dominant. Patent anterior communicating artery.  Complete loss of flow related enhancement within RIGHT vertebral artery, which was occluded on prior MRI. Irregular LEFT vertebral artery flow related enhancement, vessel is patent. Basilar artery appears widely patent. Normal flow related enhancement of proximal posterior cerebral arteries, limited assessment of distal branches due to motion.  Limited assessment for small aneurysm due to motion. No suspicious luminal irregularity.  IMPRESSION: MRI HEAD: No acute intracranial process, specifically no acute ischemia.  Remote RIGHT greater than LEFT cerebellar and RIGHT ventral pontine infarcts. Small remote RIGHT cerebellar infarct.  Involutional changes. Mild to moderate white matter changes likely represent chronic  small vessel ischemic disease.  MRA HEAD: Chronic RIGHT vertebral artery occlusion. Mildly irregular LEFT vertebral artery likely reflects atherosclerosis without high-grade stenosis. No acute vascular process though mild motion  degraded examination.   Electronically Signed   By: Elon Alas   On: 04/19/2014 22:40    Scheduled Meds: . amLODipine  5 mg Oral Daily  . atorvastatin  10 mg Oral Daily  . B-complex with vitamin C  1 tablet Oral Daily  . cefTRIAXone (ROCEPHIN)  IV  1 g Intravenous Q24H  . darifenacin  7.5 mg Oral Daily  . docusate sodium  100 mg Oral BID  . ezetimibe  10 mg Oral Daily  . furosemide  40 mg Oral Daily  . losartan  100 mg Oral Daily  . multivitamin with minerals  1 tablet Oral Daily  . omega-3 acid ethyl esters  1 g Oral Daily  . tamsulosin  0.4 mg Oral Daily  . warfarin  5 mg Oral ONCE-1800  . Warfarin - Pharmacist Dosing Inpatient   Does not apply q1800   Continuous Infusions:   Principal Problem:   CVA (cerebral infarction) Active Problems:   Chronic atrial fibrillation   Hypercholesterolemia   Prostate cancer metastatic to bone   Chronic kidney disease (CKD), stage III (moderate)   Chronic diastolic heart failure   Essential hypertension   History of stroke   Chronic back pain    Time spent: *25 minutes    [facility] Pager [phone]. If 7PM-7AM, please contact night-coverage at [email or web address], password [facility] 04/21/2014, 11:13 AM

## 2014-04-21 NOTE — Progress Notes (Signed)
Patient's urine dark/brown in catheter drainage bag. Continuing to monitor. Craig [name], Craig [name]

## 2014-04-21 NOTE — Progress Notes (Signed)
Utilization Review Completed.   Violeta Lecount, RN, BSN Nurse Case Manager  

## 2014-04-22 ENCOUNTER — Telehealth: Payer: Self-pay | Admitting: *Deleted

## 2014-04-22 ENCOUNTER — Telehealth: Payer: Self-pay | Admitting: Cardiology

## 2014-04-22 ENCOUNTER — Inpatient Hospital Stay (HOSPITAL_COMMUNITY): Payer: Medicare Other

## 2014-04-22 DIAGNOSIS — M549 Dorsalgia, unspecified: Secondary | ICD-10-CM | POA: Diagnosis present

## 2014-04-22 DIAGNOSIS — N179 Acute kidney failure, unspecified: Secondary | ICD-10-CM | POA: Diagnosis not present

## 2014-04-22 DIAGNOSIS — N183 Chronic kidney disease, stage 3 (moderate): Secondary | ICD-10-CM | POA: Diagnosis present

## 2014-04-22 DIAGNOSIS — Z8673 Personal history of transient ischemic attack (TIA), and cerebral infarction without residual deficits: Secondary | ICD-10-CM | POA: Diagnosis not present

## 2014-04-22 DIAGNOSIS — Z87891 Personal history of nicotine dependence: Secondary | ICD-10-CM | POA: Diagnosis not present

## 2014-04-22 DIAGNOSIS — G8929 Other chronic pain: Secondary | ICD-10-CM | POA: Diagnosis present

## 2014-04-22 DIAGNOSIS — C7951 Secondary malignant neoplasm of bone: Secondary | ICD-10-CM | POA: Diagnosis present

## 2014-04-22 DIAGNOSIS — I6789 Other cerebrovascular disease: Secondary | ICD-10-CM

## 2014-04-22 DIAGNOSIS — Z7901 Long term (current) use of anticoagulants: Secondary | ICD-10-CM | POA: Diagnosis not present

## 2014-04-22 DIAGNOSIS — I5032 Chronic diastolic (congestive) heart failure: Secondary | ICD-10-CM | POA: Diagnosis present

## 2014-04-22 DIAGNOSIS — R4701 Aphasia: Secondary | ICD-10-CM | POA: Diagnosis present

## 2014-04-22 DIAGNOSIS — I110 Hypertensive heart disease with heart failure: Secondary | ICD-10-CM | POA: Diagnosis present

## 2014-04-22 DIAGNOSIS — Z85820 Personal history of malignant melanoma of skin: Secondary | ICD-10-CM | POA: Diagnosis not present

## 2014-04-22 DIAGNOSIS — E860 Dehydration: Secondary | ICD-10-CM | POA: Diagnosis present

## 2014-04-22 DIAGNOSIS — I482 Chronic atrial fibrillation: Secondary | ICD-10-CM | POA: Diagnosis present

## 2014-04-22 DIAGNOSIS — I6340 Cerebral infarction due to embolism of unspecified cerebral artery: Secondary | ICD-10-CM

## 2014-04-22 DIAGNOSIS — Z923 Personal history of irradiation: Secondary | ICD-10-CM | POA: Diagnosis not present

## 2014-04-22 DIAGNOSIS — K219 Gastro-esophageal reflux disease without esophagitis: Secondary | ICD-10-CM | POA: Diagnosis present

## 2014-04-22 DIAGNOSIS — R310 Gross hematuria: Secondary | ICD-10-CM | POA: Diagnosis not present

## 2014-04-22 DIAGNOSIS — C61 Malignant neoplasm of prostate: Secondary | ICD-10-CM | POA: Diagnosis present

## 2014-04-22 DIAGNOSIS — R509 Fever, unspecified: Secondary | ICD-10-CM | POA: Diagnosis present

## 2014-04-22 DIAGNOSIS — Z79899 Other long term (current) drug therapy: Secondary | ICD-10-CM | POA: Diagnosis not present

## 2014-04-22 DIAGNOSIS — N390 Urinary tract infection, site not specified: Secondary | ICD-10-CM | POA: Diagnosis not present

## 2014-04-22 DIAGNOSIS — E785 Hyperlipidemia, unspecified: Secondary | ICD-10-CM | POA: Diagnosis present

## 2014-04-22 DIAGNOSIS — G459 Transient cerebral ischemic attack, unspecified: Secondary | ICD-10-CM | POA: Diagnosis present

## 2014-04-22 LAB — URINE CULTURE
COLONY COUNT: NO GROWTH
CULTURE: NO GROWTH
Colony Count: NO GROWTH
Culture: NO GROWTH

## 2014-04-22 LAB — PROTIME-INR
INR: 1.72 — AB (ref 0.00–1.49)
PROTHROMBIN TIME: 20.3 s — AB (ref 11.6–15.2)

## 2014-04-22 LAB — HEMOGLOBIN A1C
HEMOGLOBIN A1C: 5.5 % (ref 4.8–5.6)
Mean Plasma Glucose: 111 mg/dL

## 2014-04-22 LAB — GLUCOSE, CAPILLARY: Glucose-Capillary: 128 mg/dL — ABNORMAL HIGH (ref 70–99)

## 2014-04-22 MED ORDER — AMLODIPINE BESYLATE 10 MG PO TABS
10.0000 mg | ORAL_TABLET | Freq: Every day | ORAL | Status: DC
  Administered 2014-04-23: 10 mg via ORAL
  Filled 2014-04-22: qty 1

## 2014-04-22 MED ORDER — WARFARIN SODIUM 5 MG PO TABS: 5.0000 mg | ORAL_TABLET | Freq: Once | ORAL | Status: DC

## 2014-04-22 MED ORDER — GADOBENATE DIMEGLUMINE 529 MG/ML IV SOLN
18.0000 mL | Freq: Once | INTRAVENOUS | Status: AC | PRN
  Administered 2014-04-22: 18 mL via INTRAVENOUS

## 2014-04-22 NOTE — Progress Notes (Signed)
UR completed 

## 2014-04-22 NOTE — Consult Note (Addendum)
Subjective: I was asked to see Dr. [name] in consultation by Dr. Darrick Meigs for gross hematuria.   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  His PSA at diagnosis was only 19.   He was treated initially with Lupron and Casodex and more recently had been on Xtandi instead.  He has been off of that now for about a month for progressive leg weakness which in the past had resolved with a holiday from the [place].   He completed spinal radiation for a met in early January.   He was admitted for aphasia on 12/26 and required I&O cath for retention and then had a 29f foley placed.  There was some difficulty placing the foley and there was associated bleeding.  He is on warfarin for afib and his INR is 1.72.  The urine has been clearing but he still has some dark blood when he is repositioned in the bed.   The foley is a 142fand it is draining well.   His last PSA in December was 11 which was an increase from 4.9 in November.  It had been stable in the months prior to that.   He has not had one rechecked since going off of the XtKonterraHe was placed on antibiotics for a presumed UTI but he had a clear urine on admission and only hematuria on a subsequent UA on 2/28.    That UA was Nit+ but that is most likely a false positive from the bleeding.  He has 2 negative urine cultures this admission.   ROS:  Review of Systems  Constitutional: Negative for fever and chills.  Respiratory: Negative for cough.   Cardiovascular: Positive for leg swelling (that has improved some off of the Xtandi. ). Negative for chest pain.  Gastrointestinal: Positive for constipation (he has not had a BM in 4 days but has not had much intake).  Genitourinary:       He has some foley irritation  Musculoskeletal: Negative.   Neurological: Positive for speech change and focal weakness. Negative for sensory change.       He has  aphasia and some lethargy with confusion.   He has had leg weakness as well.   All other systems reviewed and are negative.  Allergies  Allergen Reactions  . Pravachol     Muscle weakness  . Zocor [Simvastatin - High Dose]     Muscle weakness    Past Medical History  Diagnosis Date  . Hyperlipidemia   . Hypokalemia   . Hypertensive heart disease   . Chronic back pain   . History of epistaxis 07/19/2002  . Personal history of long-term (current) use of anticoagulants   . ICH (intracerebral hemorrhage) ~ 1999    after TPA/notes 09/27/2012  . Hypertension   . Atrial fibrillation   . PAT (paroxysmal atrial tachycardia)   . Pneumonia     "once; several years ago" (09/27/2012)  . GERD (gastroesophageal reflux disease)   . H/O hiatal hernia   . Migraines     "migraines without headaches years ago" (09/27/2012)  . Stroke ~ 1999    ischemic / right cerebellar/posterior inferior cerebellar artery / right pos infarct  . Melanoma     "top of my head" (09/27/2012)  . Prostate cancer, primary, with metastasis from prostate to other site     on Lupron injections  per GU  . Osteoarthritis of both feet   . S/P radiation therapy 02/13/14-02/27/14    SRS 5/5 spine completed 02/27/14    Past Surgical History  Procedure Laterality Date  . Nasal sinus surgery  1998  . Cataract extraction w/ intraocular lens  implant, bilateral Bilateral ~ 2011  . Skin graft Right 2010  . Melanoma excision  2010    "pre-melanoma on top of head; did skin graft from left thigh to cover" (09/27/2012)  . Tonsillectomy  ~ 1935    History   Social History  . Marital Status: Married    Spouse Name: [name]  . Number of Children: 3  . Years of Education: MD   Occupational History  .     Social History Main Topics  . Smoking status: Former Smoker -- .5 years    Types: Cigarettes    Quit date: 02/22/1953  . Smokeless tobacco: Never Used  . Alcohol Use: Yes     Comment: 09/27/2012 "glass of wine or 2/month"  . Drug  Use: No  . Sexual Activity: No   Other Topics Concern  . Not on file   Social History Narrative   Patient lives at home with spouse.   Caffeine Use: 1/4 cup daily    Family History  Problem Relation Age of Onset  . Heart failure Mother   . Heart attack Father     Anti-infectives: Anti-infectives    Start     Dose/Rate Route Frequency Ordered Stop   04/21/14 1000  cefTRIAXone (ROCEPHIN) 1 g in dextrose 5 % 50 mL IVPB - Premix     1 g 100 mL/hr over 30 Minutes Intravenous Every 24 hours 04/21/14 0933        Current Facility-Administered Medications  Medication Dose Route Frequency Provider Last Rate Last Dose  . acetaminophen (TYLENOL) tablet 650 mg  650 mg Oral Q6H PRN Oswald Hillock, MD   650 mg at 04/22/14 1433  . [START ON 04/23/2014] amLODipine (NORVASC) tablet 10 mg  10 mg Oral Daily Oswald Hillock, MD      . atorvastatin (LIPITOR) tablet 10 mg  10 mg Oral Daily Theressa Millard, MD   10 mg at 04/22/14 1034  . B-complex with vitamin C tablet 1 tablet  1 tablet Oral Daily Theressa Millard, MD   1 tablet at 04/22/14 1034  . cefTRIAXone (ROCEPHIN) 1 g in dextrose 5 % 50 mL IVPB - Premix  1 g Intravenous Q24H Oswald Hillock, MD   1 g at 04/22/14 1032  . darifenacin (ENABLEX) 24 hr tablet 7.5 mg  7.5 mg Oral Daily Theressa Millard, MD   7.5 mg at 04/22/14 1034  . docusate sodium (COLACE) capsule 100 mg  100 mg Oral BID Theressa Millard, MD   100 mg at 04/22/14 1034  . ezetimibe (ZETIA) tablet 10 mg  10 mg Oral Daily Theressa Millard, MD   10 mg at 04/22/14 1034  . furosemide (LASIX) tablet 40 mg  40 mg Oral Daily Theressa Millard, MD   40 mg at 04/22/14 1034  . hydrALAZINE (APRESOLINE) tablet 25 mg  25 mg Oral Q6H PRN Oswald Hillock, MD   25 mg at 04/22/14 1054  . losartan (COZAAR) tablet 100 mg  100 mg Oral Daily Theressa Millard, MD   100 mg at 04/22/14 1111  . multivitamin with minerals tablet 1 tablet  1 tablet Oral Daily Theressa Millard, MD   1  tablet at 04/22/14  1034  . omega-3 acid ethyl esters (LOVAZA) capsule 1 g  1 g Oral Daily Theressa Millard, MD   1 g at 04/22/14 1034  . tamsulosin (FLOMAX) capsule 0.4 mg  0.4 mg Oral Daily Oswald Hillock, MD   0.4 mg at 04/22/14 1034     Objective: Vital signs in last 24 hours: Temp:  [97.7 F (36.5 C)-101.3 F (38.5 C)] 97.7 F (36.5 C) (02/29 1755) Pulse Rate:  [70-98] 70 (02/29 1755) Resp:  [18] 18 (02/29 1755) BP: (119-187)/(54-98) 119/54 mmHg (02/29 1755) SpO2:  [92 %-98 %] 98 % (02/29 1755)  Intake/Output from previous day: 02/28 0701 - 02/29 0700 In: -  Out: 100 [Urine:100] Intake/Output this shift:     Physical Exam  Constitutional: He is well-developed, well-nourished, and in no distress. Vital signs are normal.  He is arousable but sleepy and can answer questions haltingly.   HENT:  Head: Normocephalic and atraumatic.  Neck: Normal range of motion. Neck supple.  Cardiovascular:  IRR with murmur  Pulmonary/Chest: Effort normal and breath sounds normal. No respiratory distress.  Abdominal: Soft. Bowel sounds are normal. He exhibits no distension and no mass. There is no tenderness.  Genitourinary:  He has a normal phallus with a 16 fr silastic foley draining very slightly rusty urine  Musculoskeletal: He exhibits edema. He exhibits no tenderness.  Neurological:  He is lethargic but arousable and oriented.   He has a mild aphasia.   Skin: Skin is warm and dry.    Lab Results:   Recent Labs  04/19/14 1942 04/19/14 1947 04/21/14 1145  WBC 10.0  --  13.2*  HGB 13.7 15.6 13.3  HCT 42.2 46.0 40.3  PLT 200  --  201   BMET  Recent Labs  04/19/14 1942 04/19/14 1947 04/21/14 0512  NA 140 142 138  K 4.0 4.0 4.0  CL 103 103 106  CO2 27  --  25  GLUCOSE 123* 120* 116*  BUN 30* 33* 26*  CREATININE 1.42* 1.30 1.26  CALCIUM 10.0  --  9.4   PT/INR  Recent Labs  04/21/14 0512 04/22/14 0628  LABPROT 20.2* 20.3*  INR 1.70* 1.72*   ABG No results for input(s):  PHART, HCO3 in the last 72 hours.  Invalid input(s): PCO2, PO2  Studies/Results: No results found.  I have reviewed his office notes from Dr. Junious Silk and Dr. Alen Blew and discussed his case with Dr. Darrick Meigs.   I have reviewed his past medical, surgical, family and social history and his recent labs and imaging studies.     Assessment: He has gross hematuria that is probably secondary to his prostate cancer and difficult foley placement while on warfarin. His urine cultures are negative so I don't believe he had a UTI. The foley is currently draining well with minimal hematuria.  He has been on tamsulosin and enablex for LUTS and the enablex can contribute to retention.   Plan: Continue foley drainage. I would stop enablex. You can consider stopping the Ceftriaxone unless there is a reason other than UTI to continue it. I have ordered a PSA. Since he is off of the [facility] for leg weakness, it might be worthwhile for Dr. Alen Blew to consider Zytiga instead. If it is feasible to hold the warfarin until the bleeding has completely resolved, that would be worthwhile. MRI pending for further evaluation of the leg weakness.   CC: Dr. Eleonore Chiquito, Dr. Eda Keys and Dr. Zola Button.  LOS: 0 days    Malka So 04/22/2014

## 2014-04-22 NOTE — Telephone Encounter (Signed)
Mrs. [name] called stating husband is in the hospital since Friday, and the Hospital ist , Dr. Lisabeth Devoid, MD, page # [phone], to discuss patient status and need for MRI, patient may have had a TIA/ ,will have Dr. Lisbeth Renshaw page Hospitalist when he is oput of his procedure with another patient at this time, but also asked since patient is admitted the Hospitalist should order an earlier test   8:30 AM

## 2014-04-22 NOTE — Progress Notes (Signed)
ANTICOAGULATION CONSULT NOTE - Follow-up Consult  Pharmacy Consult for Coumadin Indication: atrial fibrillation  Allergies  Allergen Reactions  . Pravachol     Muscle weakness  . Zocor [Simvastatin - High Dose]     Muscle weakness    Patient Measurements: Height: 6' (182.9 cm) Weight: 184 lb 11.2 oz (83.779 kg) IBW/kg (Calculated) : 77.6  Vital Signs: Temp: 98.6 F (37 C) (02/29 0543) Temp Source: Oral (02/29 0543) BP: 164/65 mmHg (02/29 0543) Pulse Rate: 87 (02/29 0543)  Labs:  Recent Labs  04/19/14 1942 04/19/14 1947 04/20/14 0610 04/21/14 0512 04/21/14 1145 04/22/14 0628  HGB 13.7 15.6  --   --  13.3  --   HCT 42.2 46.0  --   --  40.3  --   PLT 200  --   --   --  201  --   APTT 38*  --   --   --   --   --   LABPROT 21.5*  --  21.2* 20.2*  --  20.3*  INR 1.84*  --  1.82* 1.70*  --  1.72*  CREATININE 1.42* 1.30  --  1.26  --   --     Estimated Creatinine Clearance: 45.3 mL/min (by C-G formula based on Cr of 1.26).  Assessment: 81 y.o. male presents with multiple episodes of expressive aphasia - h/o CVA. Pt on coumadin PTA for afib. INR remains subtherapeutic at 1.72. Dose held 2/27 due to mild bleeding at foley cath site but the bleeding has resolved.   Home dose: 2.5mg  daily except for 5mg  on Mon and Wed  Goal of Therapy:  INR 2-3 Monitor platelets by anticoagulation protocol: Yes   Plan:  -warfarin 5mg  tonight -daily INR -follow for further bleeding  Hildred Laser, Pharm D 04/22/2014 8:23 AM

## 2014-04-22 NOTE — Progress Notes (Signed)
[facility]  Patient Status: Active (receiving services up to time of hospitalization)  [facility] is providing the following services: RN and PT  If patient discharges after hours, please call [phone].   [name] 04/22/2014, 10:02 AM

## 2014-04-22 NOTE — Telephone Encounter (Signed)
New message      FYI Pt has had a slight stroke last fri.  They are calling it a TIA.  He is in [facility]----want Dr Mare Ferrari to know

## 2014-04-22 NOTE — Progress Notes (Signed)
STROKE TEAM PROGRESS NOTE   HISTORY Craig Reisig. is an 81 y.o. male with a past medical history significant for hyperlipidemia, HTN, atrial fibrillation on coumadin, ischemic infarct s/p IV tpa complicated by ICH, migraines, prostate cancer metastatic to bone, brought in via EMS as a code stroke due to acute onset of transient language impairment. Wife is at the bedside and said that he was home siting in a recliner watching TV when she noticed that his speech was very garbled and he was little confused for a couple of minutes. She did not noticed any other changes but called EMS and reportedly patient had several episodes of transient language impairment since being evaluated by EMS. There is not reported HA, vertigo, double vision, focal arm/legs weakness, or visual disturbances. NIHSS 2. CT brain showed no acute abnormality. INR 1.84. Last known well: 04/19/13 at 3:45. tPA not given as out of the window, minimal deficits, previous post IV tpa ICH, coumadin with elevated INR.    SUBJECTIVE (INTERVAL HISTORY) His wife is at the bedside. 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 His coumadin resumed yesterday, however, today he again had hematuria. His cardiologist recommend INR 1.8-2.2 due to hx of nose bleeding while on coumadin.    OBJECTIVE Temp:  [98.3 F (36.8 C)-99.7 F (37.6 C)] 98.6 F (37 C) (02/29 0543) Pulse Rate:  [82-94] 87 (02/29 0543) Cardiac Rhythm:  [-] Atrial fibrillation (02/28 2000) Resp:  [16-20] 18 (02/29 0543) BP: (124-164)/(39-76) 164/65 mmHg (02/29 0543) SpO2:  [93 %-97 %] 97 % (02/29 0543)   Recent Labs Lab 04/19/14 1957  GLUCAP 123*    Recent Labs Lab 04/18/14 1208 04/19/14 1942 04/19/14 1947 04/21/14 0512  NA 142 140 142 138  K 3.8 4.0 4.0 4.0  CL 108 103 103 106  CO2 28 27  --  25  GLUCOSE 113* 123* 120* 116*  BUN 32* 30* 33* 26*  CREATININE 1.24 1.42* 1.30 1.26  CALCIUM 9.5 10.0  --  9.4    Recent Labs Lab 04/18/14 1208  04/19/14 1942  AST 17 22  ALT 10 13  ALKPHOS 55 70  BILITOT 0.5 0.7  PROT 6.5 7.1  ALBUMIN 3.0* 3.2*    Recent Labs Lab 04/18/14 1208 04/19/14 1942 04/19/14 1947 04/21/14 1145  WBC 9.1 10.0  --  13.2*  NEUTROABS 5.9 5.8  --   --   HGB 13.1 13.7 15.6 13.3  HCT 40.5 42.2 46.0 40.3  MCV 100.7* 98.4  --  97.3  PLT 204 200  --  201   No results for input(s): CKTOTAL, CKMB, CKMBINDEX, TROPONINI in the last 168 hours.  Recent Labs  04/19/14 1942 04/20/14 0610 04/21/14 0512 04/22/14 0628  LABPROT 21.5* 21.2* 20.2* 20.3*  INR 1.84* 1.82* 1.70* 1.72*    Recent Labs  04/19/14 2051 04/21/14 1052  COLORURINE YELLOW BROWN*  LABSPEC 1.013 1.026  PHURINE 6.5 5.5  GLUCOSEU NEGATIVE NEGATIVE  HGBUR SMALL* LARGE*  BILIRUBINUR NEGATIVE LARGE*  KETONESUR NEGATIVE 15*  PROTEINUR NEGATIVE 100*  UROBILINOGEN 1.0 1.0  NITRITE NEGATIVE POSITIVE*  LEUKOCYTESUR NEGATIVE MODERATE*       Component Value Date/Time   CHOL 130 04/21/2014 0512   TRIG 97 04/21/2014 0512   HDL 41 04/21/2014 0512   CHOLHDL 3.2 04/21/2014 0512   VLDL 19 04/21/2014 0512   LDLCALC 70 04/21/2014 0512   Lab Results  Component Value Date   HGBA1C 5.6 09/27/2012      Component Value Date/Time  LABOPIA NONE DETECTED 04/19/2014 2051   COCAINSCRNUR NONE DETECTED 04/19/2014 2051   LABBENZ NONE DETECTED 04/19/2014 2051   AMPHETMU NONE DETECTED 04/19/2014 2051   THCU NONE DETECTED 04/19/2014 2051   LABBARB NONE DETECTED 04/19/2014 2051     Recent Labs Lab 04/19/14 1942  ETH <5    Dg Foot Complete Right 04/18/2014    No definite acute osseous abnormalities.  Calcaneal spurring.  Probable bone island within tarsal navicular.     Dg Femur Min 2 Views Left 04/18/2014    Negative.     Dg Femur, Min 2 Views Right 04/18/2014    Normal right femur.     Dg Chest 2 View 04/18/2014    No acute infiltrate or pulmonary edema. Stable left basilar atelectasis or scarring. Compression deformities thoracic  spine as described above.     Dg Lumbar Spine Complete 04/18/2014    Mild multilevel degenerative disc disease is noted. No acute abnormality seen in the lumbar spine.     Dg Pelvis 1-2 Views 04/18/2014    No acute fracture or subluxation. Stable sclerotic foci as described above.      Ct Head Wo Contrast 04/19/2014    1. No CT evidence of acute infarction.  2. No intracranial hemorrhage.  3. Remote right cerebellar infarction.  4. Extensive atrophy and white matter microvascular disease.   MRI HEAD:  04/19/2014    No acute intracranial process, specifically no acute ischemia.  Remote RIGHT greater than LEFT cerebellar and RIGHT ventral pontine infarcts. Small remote RIGHT cerebellar infarct.  Involutional changes. Mild to moderate white matter changes likely represent chronic small vessel ischemic disease.    MRA HEAD:  04/19/2014    Chronic RIGHT vertebral artery occlusion. Mildly irregular LEFT vertebral artery likely reflects atherosclerosis without high-grade stenosis. No acute vascular process though mild motion degraded examination.    CUS - Bilateral: 1-39% ICA stenosis. Right vertebral artery flow is antegrade. Left vertebral artery flow not insonated.  2D echo  - The patient was in atrial fibrillation. Normal LV size with mild LV hypertrophy. EF 55-60%. Mild aortic stenosis and mild aortic insufficiency. Normal RV size and systolic function. Moderate biatrial enlargement. Moderate pulmonary hypertension.   PHYSICAL EXAM Physical exam  Temp:  [97.7 F (36.5 C)-101.3 F (38.5 C)] 98.2 F (36.8 C) (02/29 2103) Pulse Rate:  [70-98] 89 (02/29 2103) Resp:  [18] 18 (02/29 2103) BP: (119-187)/(54-98) 159/91 mmHg (02/29 2103) SpO2:  [92 %-98 %] 95 % (02/29 2103)  General - Well nourished, well developed, in no apparent distress.  Ophthalmologic - not able to test due to small pupils.  Cardiovascular - irregularly irregular heart rate and rhythm.  Mental  Status: Speech is clearer today. The patient is oriented to person and place but not date. He is alert and able to perform simple math calculations.  Able to follow 2 step commands without difficulty. Cranial Nerves: II: Visual fields grossly normal, pupils equal, round, reactive to light and accommodation III,IV, VI: ptosis not present, extra-ocular motions intact bilaterally V,VII: smile asymmetric VIII: Hard of hearing bilaterally IX,X: gag reflex present XI: bilateral shoulder shrug XII: midline tongue extension without atrophy or fasciculations Motor: Right :Upper extremity 5-/5Left: Upper extremity 5-/5 Lower extremity 5-/5Lower extremity 5-/5 Tone and bulk:normal tone throughout; no atrophy noted Sensory: Light touch intact throughout, bilaterally Deep Tendon Reflexes:  1+ throughout Cerebellar: normal finger-to-nose with tremor Gait:  Deferred for safety reasons  ASSESSMENT/PLAN Mr. Baruch [name]. 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 He did not receive IV t-PA due to late presentation, minimal deficits, previous post IV tpa ICH, coumadin with elevated INR. On admission, INR 1.84.   Possible TIA secondary to atrial fibrillation in setting of subtherapeutic warfarin level.  Resultant mild confusion  MRI  negative for acute stroke  MRA chronic right vertebral artery occlusion.  Carotid Doppler unremarkable  2D Echo EF 55-60%   LDL 70  HgbA1c 5.6   Warfarin for VTE prophylaxis  Diet full liquid with thin liquids  warfarin prior to admission since March 2011, now on  warfarin. Pharmacy dosing. Warfarin held Sat d/t hematuria which has improved. Back on warfarin. INR 1.72 today. As per his cardiologist, his INR goal 1.8-2.2 due to hx of epistaxis while on coumadin.   Ongoing aggressive stroke risk factor management  Therapy recommendations:  Home health physical therapy, active with [facility]  Disposition:  Planned return home with [facility]  Nothing further to add from stroke standpoint. Will sign off. Please call for questions, needs.  No indication for neuro followup at this time.  Chronic afib  On home coumadin  INR 1.8-2.2 as per his cardiologist due to hx of epistaxis while on coumadin  INR 1.72 today  Medication compliance emphasized today  Hypertension  Home meds:  Norvasc 5 mg daily and Cozaar 100 mg daily  stable  Hyperlipidemia  Home meds:  Lipitor 10 mg daily resumed in hospital  LDL 70 , goal < 70  Continue statin at discharge  Other Stroke Risk Factors  Advanced age  Hx stroke/TIA  Migraine headaches  Other Active Problems  Mildly renal insufficiency - possible dehydration, Cr 1.2-1.4  Other Pertinent History  Prostate cancer with bone metastasis  Ronaldo Miyamoto [place] for Pager information 04/22/2014 10:34 AM   I, the attending vascular neurologist, have personally obtained a history, examined the patient, evaluated laboratory data, individually viewed imaging studies and agree with radiology interpretations. I also obtained additional history from pt's wife at bedside. I also discussed with Dr. Darrick Meigs regarding his care plan. Together with the NP/PA, we formulated the assessment and plan of care which reflects our mutual decision.  I have made any additions or clarifications directly to the above note and agree with the findings and plan as currently documented.   80 yo M with hx of HTN, HLD, afib on coumadin with INR goal 1.8-2.2, CVA with ICH s/p tPA, prostate cancer mets to bone presented  with transient episodic language deficit. Resolved now. On admission, INR 1.84, now 1.72. MRI negative for stroke. Coumadin resumed but had hematuria, today improved. Would recommend to continue coumadin with INR 1.8-2.2 for stroke prevention. Control stroke risk factors. Continue statin.   Neurology will sign off. Please call with questions. No neuro follow up indicated at this time. Thanks for the consult.  Rosalin Hawking, MD PhD Stroke Neurology 04/22/2014 9:18 PM        To contact Stroke Continuity provider, please refer to [email or web address] After hours, contact General Neurology

## 2014-04-22 NOTE — Telephone Encounter (Signed)
Thanks

## 2014-04-22 NOTE — Progress Notes (Signed)
Physical Therapy Treatment Patient Details Name: Craig [name]. MRN: [id number] DOB: November 10, 1924 Today's Date: 04/22/2014    History of Present Illness Pt is an 81 y/o male with a history of a previous CVA/ICH in 2002, TIA's, A-fib, HTN, hyperlipidemia and prostate cancer with metastatic disease to the bone who presents tot he ED with complaints of difficulty speaking which occured around 3:45pm. His wife had noticed his garbled speech at that time. His wife reports to the ED that the symptoms resolved but then returned at [place] and did not resolve. He was brought to the ED in the next hour when his symptoms did not appear to be improving. CT and MRI acutely unremarkable.    PT Comments    Pt weaker today (also with fever). Wife inquiring about post-acute rehab prior to return home. Anticipate pt will not tolerate intensity of inpatient rehab and recommending SNF.   Follow Up Recommendations  Supervision/Assistance - 24 hour;SNF     Equipment Recommendations  None recommended by PT    Recommendations for Other Services       Precautions / Restrictions Precautions Precautions: Fall Restrictions Weight Bearing Restrictions: No    Mobility  Bed Mobility Overal bed mobility: Needs Assistance Bed Mobility: Rolling;Sidelying to Sit;Sit to Sidelying Rolling: Total assist Sidelying to sit: Total assist;+2 for physical assistance Supine to sit: Mod assist;HOB elevated   Sit to sidelying: Max assist General bed mobility comments: rolling Rt and Lt pt reaches across, cannot reach rail until dependently rolled far enough to reach and then weak effort to complete rolling; HOB elevated and use of bed pad under pelvis to assist with supine to side to sit  Transfers                 General transfer comment: unable  Ambulation/Gait             General Gait Details: Unable at this time.    Stairs            Wheelchair Mobility    Modified Rankin (Stroke  Patients Only)       Balance     Sitting balance-Leahy Scale: Zero Sitting balance - Comments: pt holding onto Rt rail with RUE in sitting; pulls himself to the right and reports he feels he is in the middle Postural control: Right lateral lean                          Cognition Arousal/Alertness: Awake/alert (dysarthric with difficulty understandig pt at times) Behavior During Therapy: [facility] for tasks assessed/performed Overall Cognitive Status: Within Functional Limits for tasks assessed                      Exercises General Exercises - Lower Extremity Ankle Circles/Pumps: AAROM;Both;10 reps Quad Sets: AROM;Right;5 reps Short Arc Quad: AROM;Both;10 reps Heel Slides: AAROM;Strengthening;Both;5 reps (assisted flexion; resisted extension) Hip ABduction/ADduction: AAROM;Both;5 reps    General Comments General comments (skin integrity, edema, etc.): Wife present and assisting with bed mobility      Pertinent Vitals/Pain Pain Assessment: Faces Faces Pain Scale: Hurts little more Pain Location: Rt knee Pain Intervention(s): Limited activity within patient's tolerance;Monitored during session;Repositioned    Home Living                      Prior Function            PT Goals (current goals can now be found  in the care plan section) Acute Rehab PT Goals Patient Stated Goal: Wife now inquiring about SNF Progress towards PT goals: Not progressing toward goals - comment (fever; weaker)    Frequency  Min 3X/week    PT Plan Discharge plan needs to be updated    Co-evaluation             End of Session   Activity Tolerance: Patient limited by fatigue;Treatment limited secondary to medical complications (Comment) (fever) Patient left: with call bell/phone within reach;with family/visitor present;in bed;with bed alarm set     Time: 1450-1525 PT Time Calculation (min) (ACUTE ONLY): 35 min  Charges:  $Therapeutic Exercise: 8-22  mins $Therapeutic Activity: 8-22 mins                    G Codes:      Savayah Waltrip 05-17-2014, 3:35 PM Pager [phone]

## 2014-04-22 NOTE — Progress Notes (Addendum)
[facility] PROGRESS NOTE  Craig [name]. RKY:[id number] DOB: 06-30-1924 DOA: 04/19/2014 PCP: [name], MD  Assessment/Plan: 1. Dysarthria- resolved, MRI head is negative for stroke. Neurology is following. Continue warfarin, will discontinue aspirin. Will check neurochecks every 4 hours. 2. Urinary retention- will check bladder scan, if urinary retention will insert Foley catheter. Also start Flomax 0.4 mg daily. 3. Hematuria- patient had mild hematuria yesterday, which has been resolved at this time. Warfarin was held last night. Will need urology follow-up as outpatient. Patient empirically started on Rocephin. Urine culture is pending 4. Fever- the patient has temp of 101, will obtain blood cultures 2. Urine culture is pending. Patient empirically started on Rocephin for UTI 5. Vomiting- patient had one abuse or vomiting this morning after he drank tea. Question of brown colored vomitus, will obtain gastric occult blood. Gastric occult blood was positive, but family declined any GI evaluation. Hemoglobin is 13.3. Will check CBC in a.m. 6. Prostate cancer with metastasis- patient is currently off Xtandi as per wife due to weakness of the lower extremities. Will follow-up as outpatient with Dr. Alen Blew. Called and discussed with Dr. Lisbeth Renshaw, who recommends to get MRI of the pelvis and lumbar spine with contrast.  7. Chronic diastolic CHF- currently compensated 8. Essential hypertension- continue losartan and Lasix 9. Chronic atrial fibrillation- heart rate controlled, continue Coumadin per pharmacy consultation 10. DVT prophylaxis- anti-coagulation with Coumadin  Code Status: Full code Family Communication: *Discussed with wife at bedside Disposition Plan: Home when stable   Consultants:  Neurology  Procedures:  *None  okay are the Antibiotics:  None  HPI/Subjective: *81 y.o. male with a history of a previous CVA/ICH in 2002, TIAs, Atrial Fibrillation on  Coumadin Rx, HTN, Hyperlipidemia and Prostate Cancer with Metastatic Disease to the Bone who presents to the ED with complaints of diffculty speaking which occurred around 3:45 pm. His wife had noticed his garbled speech at that time. His Wife reports that the symptoms resolved but then returned at 5 pm and then the symptoms were not resolving. SHe thought he may have been having a TIA. He was brought to the ED in the next hour when his symptoms did not appear to be improving.   Patient seen and examined, the family wants to order MRI of the lumbar spine and pelvis as it was ordered as outpatient by Dr. Lisbeth Renshaw.  Objective: Filed Vitals:   04/22/14 1045  BP: 187/98  Pulse: 98  Temp: 101.3 F (38.5 C)  Resp: 18    Intake/Output Summary (Last 24 hours) at 04/22/14 1347 Last data filed at 04/22/14 0600  Gross per 24 hour  Intake      0 ml  Output    100 ml  Net   -100 ml   Filed Weights   04/19/14 1956 04/19/14 2301  Weight: 88.451 kg (195 lb) 83.779 kg (184 lb 11.2 oz)    Exam:   General:  Appears in no acute distress  Cardiovascular: S1-S2 regular  Respiratory: Clear bilaterally  Abdomen: Soft nontender no organomegaly  Musculoskeletal: No edema of the lower extremities  Data Reviewed: Basic Metabolic Panel:  Recent Labs Lab 04/18/14 1208 04/19/14 1942 04/19/14 1947 04/21/14 0512  NA 142 140 142 138  K 3.8 4.0 4.0 4.0  CL 108 103 103 106  CO2 28 27  --  25  GLUCOSE 113* 123* 120* 116*  BUN 32* 30* 33* 26*  CREATININE 1.24 1.42* 1.30 1.26  CALCIUM 9.5 10.0  --  9.4   Liver Function Tests:  Recent Labs Lab 04/18/14 1208 04/19/14 1942  AST 17 22  ALT 10 13  ALKPHOS 55 70  BILITOT 0.5 0.7  PROT 6.5 7.1  ALBUMIN 3.0* 3.2*   No results for input(s): LIPASE, AMYLASE in the last 168 hours. No results for input(s): AMMONIA in the last 168 hours. CBC:  Recent Labs Lab 04/18/14 1208 04/19/14 1942 04/19/14 1947 04/21/14 1145  WBC 9.1 10.0   --  13.2*  NEUTROABS 5.9 5.8  --   --   HGB 13.1 13.7 15.6 13.3  HCT 40.5 42.2 46.0 40.3  MCV 100.7* 98.4  --  97.3  PLT 204 200  --  201   Cardiac Enzymes: No results for input(s): CKTOTAL, CKMB, CKMBINDEX, TROPONINI in the last 168 hours. BNP (last 3 results) No results for input(s): BNP in the last 8760 hours.  ProBNP (last 3 results)  Recent Labs  02/08/14 1148  PROBNP 6362.0*    CBG:  Recent Labs Lab 04/19/14 1957 04/20/14 1241  GLUCAP 123* 128*    Recent Results (from the past 240 hour(s))  Urine culture     Status: None   Collection Time: 04/20/14  3:08 PM  Result Value Ref Range Status   Specimen Description URINE, RANDOM  Final   Special Requests NONE  Final   Colony Count NO GROWTH Performed at [facility]   Final   Culture NO GROWTH Performed at [facility]   Final   Report Status 04/22/2014 FINAL  Final     Studies: No results found.  Scheduled Meds: . amLODipine  5 mg Oral Daily  . atorvastatin  10 mg Oral Daily  . B-complex with vitamin C  1 tablet Oral Daily  . cefTRIAXone (ROCEPHIN)  IV  1 g Intravenous Q24H  . darifenacin  7.5 mg Oral Daily  . docusate sodium  100 mg Oral BID  . ezetimibe  10 mg Oral Daily  . furosemide  40 mg Oral Daily  . losartan  100 mg Oral Daily  . multivitamin with minerals  1 tablet Oral Daily  . omega-3 acid ethyl esters  1 g Oral Daily  . tamsulosin  0.4 mg Oral Daily  . warfarin  5 mg Oral ONCE-1800  . Warfarin - Pharmacist Dosing Inpatient   Does not apply q1800   Continuous Infusions:   Principal Problem:   CVA (cerebral infarction) Active Problems:   Chronic atrial fibrillation   Hypercholesterolemia   Prostate cancer metastatic to bone   Chronic kidney disease (CKD), stage III (moderate)   Chronic diastolic heart failure   Essential hypertension   History of stroke   Chronic back pain   Dysarthria    Time spent: *25 minutes    [facility] Pager  [phone]. If 7PM-7AM, please contact night-coverage at [email or web address], password [facility] 04/22/2014, 1:47 PM

## 2014-04-22 NOTE — Progress Notes (Signed)
Echocardiogram 2D Echocardiogram has been performed.  Craig [name] 04/22/2014, 9:43 AM

## 2014-04-22 NOTE — Telephone Encounter (Signed)
Will forward to  Dr. Brackbill  

## 2014-04-23 ENCOUNTER — Telehealth: Payer: Self-pay | Admitting: Cardiology

## 2014-04-23 LAB — COMPREHENSIVE METABOLIC PANEL
ALT: 12 U/L (ref 0–53)
AST: 15 U/L (ref 0–37)
Albumin: 2.2 g/dL — ABNORMAL LOW (ref 3.5–5.2)
Alkaline Phosphatase: 49 U/L (ref 39–117)
Anion gap: 12 (ref 5–15)
BILIRUBIN TOTAL: 1.1 mg/dL (ref 0.3–1.2)
BUN: 36 mg/dL — ABNORMAL HIGH (ref 6–23)
CHLORIDE: 99 mmol/L (ref 96–112)
CO2: 26 mmol/L (ref 19–32)
Calcium: 9.4 mg/dL (ref 8.4–10.5)
Creatinine, Ser: 1.59 mg/dL — ABNORMAL HIGH (ref 0.50–1.35)
GFR calc Af Amer: 43 mL/min — ABNORMAL LOW (ref >90)
GFR calc non Af Amer: 37 mL/min — ABNORMAL LOW (ref >90)
Glucose, Bld: 97 mg/dL (ref 70–99)
Potassium: 4.1 mmol/L (ref 3.5–5.1)
Sodium: 137 mmol/L (ref 135–145)
Total Protein: 5.5 g/dL — ABNORMAL LOW (ref 6.0–8.3)

## 2014-04-23 LAB — CBC
HEMATOCRIT: 34.1 % — AB (ref 39.0–52.0)
Hemoglobin: 11.1 g/dL — ABNORMAL LOW (ref 13.0–17.0)
MCH: 32.1 pg (ref 26.0–34.0)
MCHC: 32.6 g/dL (ref 30.0–36.0)
MCV: 98.6 fL (ref 78.0–100.0)
PLATELETS: 152 10*3/uL (ref 150–400)
RBC: 3.46 MIL/uL — ABNORMAL LOW (ref 4.22–5.81)
RDW: 14.5 % (ref 11.5–15.5)
WBC: 10.4 10*3/uL (ref 4.0–10.5)

## 2014-04-23 MED ORDER — LEVOFLOXACIN 250 MG PO TABS: 250.0000 mg | ORAL_TABLET | Freq: Every day | ORAL | Status: DC

## 2014-04-23 MED ORDER — TAMSULOSIN HCL 0.4 MG PO CAPS: 0.4000 mg | ORAL_CAPSULE | Freq: Every day | ORAL | Status: DC

## 2014-04-23 NOTE — Telephone Encounter (Signed)
1) Wife wants  Dr. Mare Ferrari to review recent echo a labs from hospital (just so he will be aware) Will forward to  Dr. Mare Ferrari   2) Per wife patient is stay off his Warfarin until INR check later this week by [place], will call results here  Will forward to [place] [facility]

## 2014-04-23 NOTE — Telephone Encounter (Signed)
New problem   Pt's wife need to speak to you concerning pt in the hospital and having to cancel some appts

## 2014-04-23 NOTE — Telephone Encounter (Signed)
Thanks. I have reviewed echo. Echo stable.

## 2014-04-23 NOTE — Progress Notes (Signed)
OT Cancellation Note  Patient Details Name: Craig [name]. MRN: [id number] DOB: 1924-07-27   Cancelled Treatment:    Reason Eval/Treat Not Completed: Patient declined, no reason specified;Other (comment). Pt's wife politely declined tx today, pt being d/c'd home today  Britt Bottom 04/23/2014, 12:26 PM

## 2014-04-23 NOTE — Clinical Social Work Note (Signed)
Clinical Social Worker has confirmed pt's home address and arranged [facility] transportation for patient to return home. Pt's wife present at bedside and notified.   DC packet on chart for transport.   Clinical Social Worker will sign off for now as social work intervention is no longer needed. Please consult [place] again if new need arises.  Glendon Axe, MSW, [place] [phone] 04/23/2014 2:08 PM

## 2014-04-23 NOTE — Care Management Note (Signed)
    Page 1 of 1   04/23/2014     3:28:05 PM CARE MANAGEMENT NOTE 04/23/2014  Patient:  Craig [name], Craig [name]   Account Number:  [id number]  Date Initiated:  04/23/2014  Documentation initiated by:  Lorne Skeens  Subjective/Objective Assessment:   Patient was admitted with CVA. Lives at home with wife. Currently with [facility] for [facility] services.     Action/Plan:   Will follow for discharge needs.   Anticipated DC Date:  04/23/2014   Anticipated DC Plan:  [place]  CM consult      Choice offered to / List presented to:          [facility] arranged  [place]      [place].   Status of service:   Medicare Important Message given?  YES (If response is "NO", the following Medicare IM given date fields will be blank) Date Medicare IM given:  04/23/2014 Medicare IM given by:  Lorne Skeens Date Additional Medicare IM given:   Additional Medicare IM given by:    Discharge Disposition:  [place]  Per UR Regulation:  Reviewed for med. necessity/level of care/duration of stay  If discussed at [place] of Stay Meetings, dates discussed:    Comments:  04/23/14 Lorne Skeens RN, MSN, [place] with [facility] was notified of [place] orders to resume service per patient and wife's request.

## 2014-04-23 NOTE — Progress Notes (Signed)
Physical Therapy Treatment Patient Details Name: Craig [name]. MRN: [id number] DOB: 01-14-1925 Today's Date: 04/23/2014    History of Present Illness Pt is an 81 y/o male with a history of a previous CVA/ICH in 2002, TIA's, A-fib, HTN, hyperlipidemia and prostate cancer with metastatic disease to the bone who presents tot he ED with complaints of difficulty speaking which occured around 3:45pm. His wife had noticed his garbled speech at that time. His wife reports to the ED that the symptoms resolved but then returned at [place] and did not resolve. He was brought to the ED in the next hour when his symptoms did not appear to be improving. CT and MRI acutely unremarkable.    PT Comments    Patient more alert this session and able to participate in therapy. Unable to stand due to increase pain and pressure in knees when attempting. He was able to sit up EOB ~10 mins with no assistance for balance. As of this morning, the patient and family received results that he has a new lesion on his femur. Due to these circumstances, family does not wish to progress with intensive therapies and would like to take the patient home with home health services. The family has a hospital bed and RW but would like to request a new wheelchair and cushion as thiers is 81 years old. Will update accordingly. Family has planned for more assistance at home and feel like they have everything in order to care for him at home. Will update evaluating PT of change in POC.   Follow Up Recommendations  Home health PT;Supervision/Assistance - 24 hour     Equipment Recommendations  Wheelchair (measurements PT);Wheelchair cushion (measurements PT)    Recommendations for Other Services       Precautions / Restrictions Precautions Precautions: Fall    Mobility  Bed Mobility Overal bed mobility: Needs Assistance Bed Mobility: Rolling;Sidelying to Sit;Sit to Sidelying Rolling: Max assist   Supine to sit: HOB elevated;Mod  assist Sit to supine: Mod assist   General bed mobility comments: A required for trunk and LE support but patient able to assist and pull up with UEs. Cues for positioning  Transfers Overall transfer level: Needs assistance Equipment used: Rolling walker (2 wheeled)   Sit to Stand: Max assist;+2 physical assistance;+2 safety/equipment;From elevated surface         General transfer comment: unable to clear bottom with max assist +2  Ambulation/Gait                 Stairs            Wheelchair Mobility    Modified Rankin (Stroke Patients Only)       Balance     Sitting balance-Leahy Scale: Fair       Standing balance-Leahy Scale: Zero                      Cognition Arousal/Alertness: Awake/alert Behavior During Therapy: WFL for tasks assessed/performed Overall Cognitive Status: Within Functional Limits for tasks assessed                      Exercises      General Comments        Pertinent Vitals/Pain Faces Pain Scale: Hurts a little bit Pain Location: Rt knee Pain Descriptors / Indicators: Sore Pain Intervention(s): Monitored during session    Home Living  Prior Function            PT Goals (current goals can now be found in the care plan section) Progress towards PT goals: Progressing toward goals    Frequency  Min 3X/week    PT Plan Discharge plan needs to be updated    Co-evaluation             End of Session   Activity Tolerance: Patient tolerated treatment well Patient left: in chair;with call bell/phone within reach     Time: 1104-1131 PT Time Calculation (min) (ACUTE ONLY): 27 min  Charges:  $Therapeutic Activity: 23-37 mins                    G Codes:      Jacqualyn Posey 04/23/2014, 1:40 PM 04/23/2014 Jacqualyn Posey PTA [phone] pager [phone] office

## 2014-04-23 NOTE — Progress Notes (Signed)
Assessment/plan:  -Gross hematuria-urine is clear today.  May have been related to Foley catheter placement.  On MRI of the lumbar spine and pelvis I was able to get a view of his kidneys ureter bladder and prostate.  There were no obvious masses or hydronephrosis.   -Acute renal failure, low urine output-again no hydronephrosis obvious on imaging.  Likely medical.  -Urinary retention - There was not a significant amount of urine in the bladder (300 mL).  I discussed with the patient and family nature risk and benefits of continuing Foley or removing Foley out the patient is on tamsulosin.  If he stays we could consider voiding trial while in hospital.  If he goes home we could consider having his wife or home health removing the catheter after he gets some of his strength back and is ambulating a bit better.   -Prostate cancer-Patient with new metastatic lesion right femoral neck high risk of pathologic fracture per radiologist.  Burnis Medin need to consult with Dr. Lisbeth Renshaw to discuss radiation options or possible recommendation from orthopedics on management options. Patient on Lupron and Xgeva with me. Dr. Alen Blew is considering change from [facility] to another agent. Current PSA in process.   Reviewed chart, notes, imaging, discussed with family and Dr. Lisabeth Devoid which took 40 minutes.    Subjective: Patient reports no complaints this morning.  Objective: Vital signs in last 24 hours: Temp:  [97.7 F (36.5 C)-101.3 F (38.5 C)] 99.4 F (37.4 C) (03/01 0943) Pulse Rate:  [70-98] 82 (03/01 0943) Resp:  [18] 18 (03/01 0943) BP: (108-187)/(50-98) 141/50 mmHg (03/01 0943) SpO2:  [92 %-98 %] 97 % (03/01 0943)  Intake/Output from previous day: 02/29 0701 - 03/01 0700 In: 480 [P.O.:480] Out: 800 [Urine:800] Intake/Output this shift:    Physical Exam:  General: No acute distress  GU: Foley in place.  Urine clear.  Lab Results:  Recent Labs  04/21/14 1145 04/23/14 0700  HGB 13.3 11.1*  HCT  40.3 34.1*   BMET  Recent Labs  04/21/14 0512 04/23/14 0700  NA 138 137  K 4.0 4.1  CL 106 99  CO2 25 26  GLUCOSE 116* 97  BUN 26* 36*  CREATININE 1.26 1.59*  CALCIUM 9.4 9.4    Recent Labs  04/21/14 0512 04/22/14 0628  INR 1.70* 1.72*   No results for input(s): LABURIN in the last 72 hours. Results for orders placed or performed during the hospital encounter of 04/19/14  Urine culture     Status: None   Collection Time: 04/20/14  3:08 PM  Result Value Ref Range Status   Specimen Description URINE, RANDOM  Final   Special Requests NONE  Final   Colony Count NO GROWTH Performed at [facility]   Final   Culture NO GROWTH Performed at [facility]   Final   Report Status 04/22/2014 FINAL  Final  Urine culture     Status: None   Collection Time: 04/21/14 10:52 AM  Result Value Ref Range Status   Specimen Description URINE, CATHETERIZED  Final   Special Requests NONE  Final   Colony Count NO GROWTH Performed at [facility]   Final   Culture NO GROWTH Performed at [facility]   Final   Report Status 04/22/2014 FINAL  Final  Culture, blood (routine x 2)     Status: None (Preliminary result)   Collection Time: 04/22/14  4:05 PM  Result Value Ref Range Status   Specimen Description BLOOD LEFT  HAND  Final   Special Requests BOTTLES DRAWN AEROBIC AND ANAEROBIC CC  Final   Culture   Final           BLOOD CULTURE RECEIVED NO GROWTH TO DATE CULTURE WILL BE HELD FOR 5 DAYS BEFORE ISSUING A FINAL NEGATIVE REPORT Performed at [facility]    Report Status PENDING  Incomplete  Culture, blood (routine x 2)     Status: None (Preliminary result)   Collection Time: 04/22/14  4:20 PM  Result Value Ref Range Status   Specimen Description BLOOD RIGHT HAND  Final   Special Requests BOTTLES DRAWN AEROBIC ONLY 10CC  Final   Culture   Final           BLOOD CULTURE RECEIVED NO GROWTH TO DATE CULTURE WILL BE HELD FOR 5 DAYS BEFORE ISSUING  A FINAL NEGATIVE REPORT Performed at [facility]    Report Status PENDING  Incomplete    Studies/Results: Mr Lumbar Spine W Wo Contrast  04/23/2014   CLINICAL DATA:  Initial evaluation for acute back and hip pain. History of metastatic prostate cancer.  EXAM: MRI LUMBAR SPINE WITHOUT AND WITH CONTRAST  TECHNIQUE: Multiplanar and multiecho pulse sequences of the lumbar spine were obtained without and with intravenous contrast.  CONTRAST:  40mL MULTIHANCE GADOBENATE DIMEGLUMINE 529 MG/ML IV SOLN  COMPARISON:  Prior MRI from 02/09/2014  FINDINGS: For the purposes of this dictation, the lowest well-formed intervertebral disc spaces presumed to be the L5-S1 level, and there presumed to be 5 lumbar type vertebral bodies.  Vertebral bodies are normally aligned with preservation of the normal lumbar lordosis. No listhesis.  Chronic compression deformity involving the T11 vertebral body is stable. Chronic endplate Schmorl's node seen at the superior endplates of L1 and L2, also unchanged.  Confluent abnormal marrow signal involving the left aspect of the L4 vertebral body again seen. This area of signal abnormality measures approximately in 25 x 35 x 28 mm, previously 34 x 23 x 30 mm. Again, there is early extension into the anterior left pedicle. No frank epidural extension. No associated pathologic fracture.  Additional osseous lesion involving the right iliac wing adjacent to the right SI joint measures 32 mm.  No new lesions identified.  Signal intensity within the visualized spinal cord is normal. The conus medullaris terminates normally at the L1 level. Nerve roots of the cauda equina within normal limits.  Paraspinous muscular atrophy noted. Paraspinous soft tissues are otherwise unremarkable.  Infrarenal aortic aneurysm measures up to 4.7 cm in transverse diameter, grossly stable. Left renal atrophy noted. No retroperitoneal adenopathy.  T10-11: Disc desiccation without significant disc bulge. No  canal or foraminal stenosis.  T11-12: Mild diffuse disc bulge with disc desiccation. No significant canal or foraminal stenosis.  T12-L1: No significant disc bulge or disc protrusion. No significant canal or foraminal stenosis.  L1-2: Mild diffuse disc bulge with disc desiccation. No focal disc herniation. Mild bilateral facet hypertrophy with ligamentum flavum thickening. No significant canal or foraminal stenosis.  L2-3: Mild disc desiccation with minimal disc bulge. No focal disc protrusion. No significant canal or foraminal stenosis.  L3-4: Degenerative disc bulge with disc desiccation and intervertebral disc space narrowing. Mild bilateral facet hypertrophy. There is resultant mild canal and bilateral foraminal stenosis. No focal disc herniation.  L4-5: Diffuse disc bulge with disc desiccation intervertebral disc space narrowing. Superimposed tiny central disc protrusion indents the ventral thecal sac. Mild bilateral facet hypertrophy with ligamentum flavum thickening. There is resultant mild  moderate canal stenosis. Mild bilateral foraminal narrowing present as well.  L5-S1: Diffuse disc bulge with intervertebral disc space narrowing. No focal disc protrusion. No significant canal stenosis. There is mild left foraminal narrowing.  Overall, these degenerative changes are stable from prior study.  IMPRESSION: 1. No acute abnormality within the lumbar spine. 2. Stable osseous metastasis involving the L4 vertebral body. No associated pathologic fracture or epidural extension. 3. Similar osseous metastasis involving the right iliac wing adjacent to the right SI joint. This is better evaluated on concomitant MRI of the pelvis. 4. Mild multilevel degenerative changes as above, most prevalent at L3-4 and L4-5. No significant canal or foraminal stenosis identified. 5. Stable chronic compression deformity of T11. 6. Grossly stable infrarenal aortic aneurysm measuring up to 4.7 cm in transverse diameter.   Electronically  Signed   By: Jeannine Boga M.D.   On: 04/23/2014 07:08   Mr Pelvis Wo Contrast  04/23/2014   CLINICAL DATA:  Back pain, hip pain, history of prostate cancer  EXAM: MRI PELVIS WITHOUT CONTRAST  TECHNIQUE: Multiplanar multisequence MR imaging of the pelvis was performed. No intravenous contrast was administered.  COMPARISON:  MRI lumbar spine 02/09/2014, CT left hip 02/09/2014, whole-body bone scan 08/07/2012  FINDINGS: Bone  Marrow: Stable 3.4 x 2.4 cm mildly T2 hyperintense and T1 intermediate signal lesion in the right posterior iliac bone. Partially visualized is a 2.4 x 3.1 cm bone lesion along the left aspect of the L4 vertebral body. There is a marrow lesion involving the medial right femoral neck measuring 2.5 x 3.3 x 3.7 cm. There is a marrow lesion in the left ilium unchanged compared with 02/09/2014.  There is a benign chondroid lesion in the proximal left femoral diaphysis.  There is no hip fracture, dislocation or avascular necrosis.  Alignment  Normal. No subluxation.  Dysplasia  None.  Joint effusion  None.  Labrum  No gross labral or paralabral abnormality.  Cartilage  Bilateral superior hip joint partial thickness cartilage loss.  Capsule and ligaments  Normal.  Muscles and Tendons  Flexors: Normal.  Extensors: Normal.  Abductors: Normal.  Adductors: Normal.  Rotators: Normal.  Hamstrings: Normal.  Other Findings  None  Viscera  Asymmetrically enlarged prostate with a Foley catheter present there is a T2 hypointense nodule in the left side of the prostate towards the apex consistent with known prostate cancer. The regular wall thickening of the bladder. Partially visualized is aneurysmal dilatation of the infrarenal abdominal aorta measuring approximately 4 cm.  IMPRESSION: 1. No acute hip fracture, dislocation or avascular necrosis. 2. Multiple osseous lesions as described above, most consistent with metastatic disease. There is a metastatic bone lesion involving the medial aspect of the  right femoral neck which is high risk for a pathologic fracture. The right posterior iliac lesion, L4 vertebral body lesion and left iliac lesion are similar in appearance compared with the prior exam of 02/09/2014. 3. Prostatic enlargement and irregular bladder wall thickening.   Electronically Signed   By: Kathreen Devoid   On: 04/23/2014 08:23      LOS: 1 day   Baldwin Racicot 04/23/2014, 10:14 AM

## 2014-04-23 NOTE — Progress Notes (Signed)
Discharge orders received.  Discharge instructions and follow-up appointments reviewed with the patient and his wife.  VSS upon discharge.  IV removed and education complete.  Transported via [facility]. Cori Razor, RN

## 2014-04-23 NOTE — Discharge Summary (Signed)
Physician Discharge Summary  Craig [name]. CBU:[id number] DOB: Oct 07, 1924 DOA: 04/19/2014  PCP: [name], MD  Admit date: 04/19/2014 Discharge date: 04/23/2014  Time spent: *45 minutes  Recommendations for Outpatient Follow-up:  1. *Follow-up urology in 1 week 2. Follow-up PCP in 2 weeks  Discharge Diagnoses:  Principal Problem:   CVA (cerebral infarction) Active Problems:   Chronic atrial fibrillation   Hypercholesterolemia   Prostate cancer metastatic to bone   Chronic kidney disease (CKD), stage III (moderate)   Chronic diastolic heart failure   Essential hypertension   History of stroke   Chronic back pain   Dysarthria   Fever   Discharge Condition: Stable  Diet recommendation: Regular diet  Filed Weights   04/19/14 1956 04/19/14 2301  Weight: 88.451 kg (195 lb) 83.779 kg (184 lb 11.2 oz)    History of present illness:  81 y.o. male with a history of a previous CVA/ICH in 2002, TIAs, Atrial Fibrillation on Coumadin Rx, HTN, Hyperlipidemia and Prostate Cancer with Metastatic Disease to the Bone who presents to the ED with complaints of diffculty speaking which occurred around 3:45 pm. His wife had noticed his garbled speech at that time. His Wife reports that the symptoms resolved but then returned at 5 pm and then the symptoms were not resolving. SHe thought he may have been having a TIA. He was brought to the ED in the next hour when his symptoms did not appear to be improving.   Hospital Course:  1. Dysarthria- resolved, MRI head is negative for stroke. Echo showed EF 55-60%. Carotid duplex showed 1-39% ICA stenosis. Neurology has signed off. Continue warfarin, will discontinue aspirin. 2. Urinary retention- patient well left urinary retention and Foley catheter was inserted . Also started Flomax 0.4 mg daily. Urology was consulted and recommend to send the patient home on Foley catheter. They will follow the patient as outpatient. Enablex has  been discontinued by urology. 3. Hematuria- patient had mild hematuria yesterday, which has been resolved at this time. Warfarin was held l. Will need urology follow-up as outpatient. Patient empirically started on Rocephin. Urine culture is negative for infection. At this time we'll discharge the patient on Levaquin 250 mg by mouth daily for 5 more days. 4. Fever- the patient has temp of 101, will obtain blood cultures 2 are negative so far. Urine culture is negative Patient empirically started on Rocephin for UTI. Fever has now resolved, will discontinue Rocephin and patient will be discharged on Levaquin 250 mg by mouth daily for 5 more days. 5. Vomiting- patient had one abuse or vomiting this morning after he drank tea. Question of brown colored vomitus, will obtain gastric occult blood. Gastric occult blood was positive, but family declined any GI evaluation. Hemoglobin is 13.3.  6. Prostate cancer with metastasis- patient is currently off Xtandi as per wife due to weakness of the lower extremities. Will follow-up as outpatient with Dr. Alen Blew. Called and discussed with Dr. Lisbeth Renshaw, who recommends to get MRI of the pelvis and lumbar spine with contrast. MRI lumbar spine showed chronic changes him a MRI pelvis showed lesion in the right femur. Called and discussed with patient's oncologist Dr. Alen Blew, who says that patient can be discharged and follow up with the radiation oncologist for possible radiation therapy. Also call Dr. Ida Rogue office, he is off today and the nurses aware of the MRI results. Dr. Lisbeth Renshaw will review the results tomorrow 7. Chronic diastolic CHF- currently compensated 8. Essential hypertension- continue losartan  9.  Chronic atrial fibrillation- heart rate controlled, continue Coumadin  10. Mild AK I- patient has developed mild AK I with creatinine 1.56, I have told the wife to hold the Lasix for next 3 days and restart Lasix on 04/26/2014. He will need a follow-up BMP in 1  week.  Procedures:  None  Consultations:  Urology  Neurology    Discharge Exam: Filed Vitals:   04/23/14 0943  BP: 141/50  Pulse: 82  Temp: 99.4 F (37.4 C)  Resp: 18    General: Appears in no acute distress Cardiovascular: S1-S2 regular Respiratory: Clear to auscultation bilaterally  Discharge Instructions   Discharge Instructions    Diet - low sodium heart healthy    Complete by:  As directed      Discharge instructions    Complete by:  As directed   Hold lasix for next three days then restart on 04/26/14     Increase activity slowly    Complete by:  As directed           Current Discharge Medication List    START taking these medications   Details  levofloxacin (LEVAQUIN) 250 MG tablet Take 1 tablet (250 mg total) by mouth daily. Qty: 5 tablet, Refills: 0    tamsulosin (FLOMAX) 0.4 MG CAPS capsule Take 1 capsule (0.4 mg total) by mouth daily. Qty: 30 capsule, Refills: 2      CONTINUE these medications which have NOT CHANGED   Details  acetaminophen (TYLENOL) 500 MG tablet Take 500 mg by mouth 2 (two) times daily. Takes one in the AM and one qhs    amLODipine (NORVASC) 5 MG tablet Take 1 tablet (5 mg total) by mouth daily. Qty: 90 tablet, Refills: 1    atorvastatin (LIPITOR) 10 MG tablet Take 1 tablet (10 mg total) by mouth daily. Qty: 90 tablet, Refills: 11   Associated Diagnoses: Hyperlipidemia    B Complex-C (B-COMPLEX WITH VITAMIN C) tablet Take 1 tablet by mouth daily.      Denosumab (XGEVA [place]) Inject into the skin every 30 (thirty) days. monthly    Docusate Calcium (STOOL SOFTENER PO) Take 1 capsule by mouth 2 (two) times daily.    furosemide (LASIX) 40 MG tablet Take 1 tablet (40 mg total) by mouth daily. Qty: 90 tablet, Refills: 3   Associated Diagnoses: Prostate cancer metastatic to bone    Leuprolide Acetate (LUPRON DEPOT IM) Inject 1 each into the muscle every 6 (six) months.     losartan (COZAAR) 100 MG tablet Take 1 tablet (100  mg total) by mouth daily. Qty: 30 tablet, Refills: 2    multivitamin (THERAGRAN) per tablet Take 1 tablet by mouth daily. ( with B - Complex )    Omega-3 Fatty Acids (FISH OIL PO) Take 1 capsule by mouth daily. ( Mega Red )    pantoprazole (PROTONIX) 40 MG tablet Take 1 tablet (40 mg total) by mouth daily. While on steroids Qty: 30 tablet, Refills: 3    warfarin (COUMADIN) 5 MG tablet Take 1 tablet (5 mg total) by mouth as directed. Qty: 30 tablet, Refills: 3    XTANDI 40 MG capsule TAKE 2 CAPSULES BY MOUTH DAILY Qty: 60 capsule, Refills: 0    ZETIA 10 MG tablet TAKE 1 TABLET BY MOUTH ONCE DAILY Qty: 30 tablet, Refills: 5      STOP taking these medications     solifenacin (VESICARE) 5 MG tablet        Allergies  Allergen Reactions  .  Pravachol     Muscle weakness  . Zocor [Simvastatin - High Dose]     Muscle weakness      The results of significant diagnostics from this hospitalization (including imaging, microbiology, ancillary and laboratory) are listed below for reference.    Significant Diagnostic Studies: Dg Chest 2 View  04/18/2014   CLINICAL DATA:  Generalized weakness, metastatic prostate cancer  EXAM: CHEST  2 VIEW  COMPARISON:  04/12/2013  FINDINGS: Cardiomediastinal silhouette is stable. No acute infiltrate or pulmonary edema. Again noted atelectasis or scarring in left base. Stable compression fracture of of T6 vertebral body. There is mild compression deformity and prior vertebroplasty at T11 level. Stable.  IMPRESSION: No acute infiltrate or pulmonary edema. Stable left basilar atelectasis or scarring. Compression deformities thoracic spine as described above.   Electronically Signed   By: Lahoma Crocker M.D.   On: 04/18/2014 13:06   Dg Lumbar Spine Complete  04/18/2014   CLINICAL DATA:  Bilateral lower extremity weakness.  EXAM: LUMBAR SPINE - COMPLETE 4+ VIEW  COMPARISON:  None.  FINDINGS: No fracture or spondylolisthesis is noted. Status post T11 kyphoplasty.  Mild degenerative disc disease is noted at L2-3, L3-4, L4-5 and L5-S1. Atherosclerotic calcifications of abdominal aorta are noted.  IMPRESSION: Mild multilevel degenerative disc disease is noted. No acute abnormality seen in the lumbar spine.   Electronically Signed   By: Marijo Conception, M.D.   On: 04/18/2014 13:11   Dg Pelvis 1-2 Views  04/18/2014   CLINICAL DATA:  Generalized weakness, metastatic prostate cancer, chemotherapy and radiation therapy  EXAM: PELVIS - 1-2 VIEW  COMPARISON:  None  FINDINGS: No pelvic fracture is noted. Stable sclerotic focus in left femoral head and left proximal femur without change from prior exam. Mild patchy sclerosis bilateral iliac bones and pubic rami, stable from prior exam. This may be due to treated metastatic disease. No destructive bony lesion is noted.  IMPRESSION: No acute fracture or subluxation. Stable sclerotic foci as described above.   Electronically Signed   By: Lahoma Crocker M.D.   On: 04/18/2014 13:11   Ct Head Wo Contrast  04/19/2014   CLINICAL DATA:  Aphasia.  Last known well at 16:00  EXAM: CT HEAD WITHOUT CONTRAST  TECHNIQUE: Contiguous axial images were obtained from the base of the skull through the vertex without intravenous contrast.  COMPARISON:  Brain MRI 02/09/2014  FINDINGS: Remote infarction in the right cerebellum.  No acute intracranial hemorrhage. No focal mass lesion. No CT evidence of acute infarction. No midline shift or mass effect. No hydrocephalus. Basilar cisterns are patent.  Extensive cortical atrophy. Periventricular white matter hypodensities.  Paranasal sinuses and  mastoid air cells are clear.  IMPRESSION: 1. No CT evidence of acute infarction. 2. No intracranial hemorrhage. 3. Remote right cerebellar infarction. 4. Extensive atrophy and white matter microvascular disease. Findings conveyed toCamilo, MDon 04/19/2014  at19:55.   Electronically Signed   By: Suzy Bouchard M.D.   On: 04/19/2014 19:57   [name] Head Wo  Contrast  04/19/2014   CLINICAL DATA:  Several episodes of expressive aphasia beginning at 3 p.m., worsening at 5 p.m. Diffuse weakness yesterday. History of metastatic prostate cancer, atrial fibrillation on Coumadin, hyperlipidemia.  EXAM: MRI HEAD WITHOUT CONTRAST  MRA HEAD WITHOUT CONTRAST  TECHNIQUE: Multiplanar, multiecho pulse sequences of the brain and surrounding structures were obtained without intravenous contrast. Angiographic images of the head were obtained using MRA technique without contrast.  COMPARISON:  CT of the head  December 17, 2013 at 1940 hours and MRI of the brain February 09, 2014  FINDINGS: MRI HEAD FINDINGS  Mild to moderately motion degraded examination. No reduced diffusion to suggest acute ischemia. Stable susceptibility artifact in the mesial RIGHT cerebellum corresponding to prior infarct. Additional scattered foci of susceptibility artifact in the periphery of the cerebral more better seen on prior examination due to motion on today's study.  Remote RIGHT greater than LEFT cerebellar infarcts. Infarct extends to the RIGHT brachium pontis. Remote RIGHT ventral pontine infarct. RIGHT posterior frontal encephalomalacia most consistent with remote infarct. Tiny remote bilateral thalamic lacunar infarcts. Tiny T2 hyperintensities in the basal ganglia may reflect perivascular spaces and/or lacunar infarcts. Moderate ventriculomegaly, likely on the basis of global parenchymal brain volume loss as there is overall commensurate enlargement of cerebral sulci and cerebellar folia. No mass effect or mass lesions. Mild to moderate white matter changes suggest chronic small vessel ischemic disease.  No abnormal extra-axial fluid collections. Status post bilateral ocular lens implants. Possible prior FESS, mild paranasal sinus mucosal thickening without air-fluid levels. Mastoid air cells are well aerated. No abnormal sellar expansion. No cerebellar tonsillar ectopia. Focal parietal scalp thinning  may reflect postsurgical change, recommend direct inspection.  MRA HEAD FINDINGS  Mildly motion degraded examination.  Anterior circulation: Normal flow related enhancement of the cervical, petrous, cavernous and supraclinoid internal carotid arteries. Normal flow related enhancement of the anterior middle cerebral arteries, limited assessment distal segments due to motion. RIGHT A1 segment is dominant. Patent anterior communicating artery.  Complete loss of flow related enhancement within RIGHT vertebral artery, which was occluded on prior MRI. Irregular LEFT vertebral artery flow related enhancement, vessel is patent. Basilar artery appears widely patent. Normal flow related enhancement of proximal posterior cerebral arteries, limited assessment of distal branches due to motion.  Limited assessment for small aneurysm due to motion. No suspicious luminal irregularity.  IMPRESSION: MRI HEAD: No acute intracranial process, specifically no acute ischemia.  Remote RIGHT greater than LEFT cerebellar and RIGHT ventral pontine infarcts. Small remote RIGHT cerebellar infarct.  Involutional changes. Mild to moderate white matter changes likely represent chronic small vessel ischemic disease.  MRA HEAD: Chronic RIGHT vertebral artery occlusion. Mildly irregular LEFT vertebral artery likely reflects atherosclerosis without high-grade stenosis. No acute vascular process though mild motion degraded examination.   Electronically Signed   By: Elon Alas   On: 04/19/2014 22:40   Mr Brain Wo Contrast  04/19/2014   CLINICAL DATA:  Several episodes of expressive aphasia beginning at 3 p.m., worsening at 5 p.m. Diffuse weakness yesterday. History of metastatic prostate cancer, atrial fibrillation on Coumadin, hyperlipidemia.  EXAM: MRI HEAD WITHOUT CONTRAST  MRA HEAD WITHOUT CONTRAST  TECHNIQUE: Multiplanar, multiecho pulse sequences of the brain and surrounding structures were obtained without intravenous contrast.  Angiographic images of the head were obtained using MRA technique without contrast.  COMPARISON:  CT of the head December 17, 2013 at 1940 hours and MRI of the brain February 09, 2014  FINDINGS: MRI HEAD FINDINGS  Mild to moderately motion degraded examination. No reduced diffusion to suggest acute ischemia. Stable susceptibility artifact in the mesial RIGHT cerebellum corresponding to prior infarct. Additional scattered foci of susceptibility artifact in the periphery of the cerebral more better seen on prior examination due to motion on today's study.  Remote RIGHT greater than LEFT cerebellar infarcts. Infarct extends to the RIGHT brachium pontis. Remote RIGHT ventral pontine infarct. RIGHT posterior frontal encephalomalacia most consistent with remote infarct. Tiny remote bilateral thalamic  lacunar infarcts. Tiny T2 hyperintensities in the basal ganglia may reflect perivascular spaces and/or lacunar infarcts. Moderate ventriculomegaly, likely on the basis of global parenchymal brain volume loss as there is overall commensurate enlargement of cerebral sulci and cerebellar folia. No mass effect or mass lesions. Mild to moderate white matter changes suggest chronic small vessel ischemic disease.  No abnormal extra-axial fluid collections. Status post bilateral ocular lens implants. Possible prior FESS, mild paranasal sinus mucosal thickening without air-fluid levels. Mastoid air cells are well aerated. No abnormal sellar expansion. No cerebellar tonsillar ectopia. Focal parietal scalp thinning may reflect postsurgical change, recommend direct inspection.  MRA HEAD FINDINGS  Mildly motion degraded examination.  Anterior circulation: Normal flow related enhancement of the cervical, petrous, cavernous and supraclinoid internal carotid arteries. Normal flow related enhancement of the anterior middle cerebral arteries, limited assessment distal segments due to motion. RIGHT A1 segment is dominant. Patent anterior  communicating artery.  Complete loss of flow related enhancement within RIGHT vertebral artery, which was occluded on prior MRI. Irregular LEFT vertebral artery flow related enhancement, vessel is patent. Basilar artery appears widely patent. Normal flow related enhancement of proximal posterior cerebral arteries, limited assessment of distal branches due to motion.  Limited assessment for small aneurysm due to motion. No suspicious luminal irregularity.  IMPRESSION: MRI HEAD: No acute intracranial process, specifically no acute ischemia.  Remote RIGHT greater than LEFT cerebellar and RIGHT ventral pontine infarcts. Small remote RIGHT cerebellar infarct.  Involutional changes. Mild to moderate white matter changes likely represent chronic small vessel ischemic disease.  MRA HEAD: Chronic RIGHT vertebral artery occlusion. Mildly irregular LEFT vertebral artery likely reflects atherosclerosis without high-grade stenosis. No acute vascular process though mild motion degraded examination.   Electronically Signed   By: Elon Alas   On: 04/19/2014 22:40   Mr Lumbar Spine W Wo Contrast  04/23/2014   CLINICAL DATA:  Initial evaluation for acute back and hip pain. History of metastatic prostate cancer.  EXAM: MRI LUMBAR SPINE WITHOUT AND WITH CONTRAST  TECHNIQUE: Multiplanar and multiecho pulse sequences of the lumbar spine were obtained without and with intravenous contrast.  CONTRAST:  34mL MULTIHANCE GADOBENATE DIMEGLUMINE 529 MG/ML IV SOLN  COMPARISON:  Prior MRI from 02/09/2014  FINDINGS: For the purposes of this dictation, the lowest well-formed intervertebral disc spaces presumed to be the L5-S1 level, and there presumed to be 5 lumbar type vertebral bodies.  Vertebral bodies are normally aligned with preservation of the normal lumbar lordosis. No listhesis.  Chronic compression deformity involving the T11 vertebral body is stable. Chronic endplate Schmorl's node seen at the superior endplates of L1 and L2,  also unchanged.  Confluent abnormal marrow signal involving the left aspect of the L4 vertebral body again seen. This area of signal abnormality measures approximately in 25 x 35 x 28 mm, previously 34 x 23 x 30 mm. Again, there is early extension into the anterior left pedicle. No frank epidural extension. No associated pathologic fracture.  Additional osseous lesion involving the right iliac wing adjacent to the right SI joint measures 32 mm.  No new lesions identified.  Signal intensity within the visualized spinal cord is normal. The conus medullaris terminates normally at the L1 level. Nerve roots of the cauda equina within normal limits.  Paraspinous muscular atrophy noted. Paraspinous soft tissues are otherwise unremarkable.  Infrarenal aortic aneurysm measures up to 4.7 cm in transverse diameter, grossly stable. Left renal atrophy noted. No retroperitoneal adenopathy.  T10-11: Disc desiccation without significant disc bulge.  No canal or foraminal stenosis.  T11-12: Mild diffuse disc bulge with disc desiccation. No significant canal or foraminal stenosis.  T12-L1: No significant disc bulge or disc protrusion. No significant canal or foraminal stenosis.  L1-2: Mild diffuse disc bulge with disc desiccation. No focal disc herniation. Mild bilateral facet hypertrophy with ligamentum flavum thickening. No significant canal or foraminal stenosis.  L2-3: Mild disc desiccation with minimal disc bulge. No focal disc protrusion. No significant canal or foraminal stenosis.  L3-4: Degenerative disc bulge with disc desiccation and intervertebral disc space narrowing. Mild bilateral facet hypertrophy. There is resultant mild canal and bilateral foraminal stenosis. No focal disc herniation.  L4-5: Diffuse disc bulge with disc desiccation intervertebral disc space narrowing. Superimposed tiny central disc protrusion indents the ventral thecal sac. Mild bilateral facet hypertrophy with ligamentum flavum thickening. There is  resultant mild moderate canal stenosis. Mild bilateral foraminal narrowing present as well.  L5-S1: Diffuse disc bulge with intervertebral disc space narrowing. No focal disc protrusion. No significant canal stenosis. There is mild left foraminal narrowing.  Overall, these degenerative changes are stable from prior study.  IMPRESSION: 1. No acute abnormality within the lumbar spine. 2. Stable osseous metastasis involving the L4 vertebral body. No associated pathologic fracture or epidural extension. 3. Similar osseous metastasis involving the right iliac wing adjacent to the right SI joint. This is better evaluated on concomitant MRI of the pelvis. 4. Mild multilevel degenerative changes as above, most prevalent at L3-4 and L4-5. No significant canal or foraminal stenosis identified. 5. Stable chronic compression deformity of T11. 6. Grossly stable infrarenal aortic aneurysm measuring up to 4.7 cm in transverse diameter.   Electronically Signed   By: Jeannine Boga M.D.   On: 04/23/2014 07:08   Mr Pelvis Wo Contrast  04/23/2014   CLINICAL DATA:  Back pain, hip pain, history of prostate cancer  EXAM: MRI PELVIS WITHOUT CONTRAST  TECHNIQUE: Multiplanar multisequence MR imaging of the pelvis was performed. No intravenous contrast was administered.  COMPARISON:  MRI lumbar spine 02/09/2014, CT left hip 02/09/2014, whole-body bone scan 08/07/2012  FINDINGS: Bone  Marrow: Stable 3.4 x 2.4 cm mildly T2 hyperintense and T1 intermediate signal lesion in the right posterior iliac bone. Partially visualized is a 2.4 x 3.1 cm bone lesion along the left aspect of the L4 vertebral body. There is a marrow lesion involving the medial right femoral neck measuring 2.5 x 3.3 x 3.7 cm. There is a marrow lesion in the left ilium unchanged compared with 02/09/2014.  There is a benign chondroid lesion in the proximal left femoral diaphysis.  There is no hip fracture, dislocation or avascular necrosis.  Alignment  Normal. No  subluxation.  Dysplasia  None.  Joint effusion  None.  Labrum  No gross labral or paralabral abnormality.  Cartilage  Bilateral superior hip joint partial thickness cartilage loss.  Capsule and ligaments  Normal.  Muscles and Tendons  Flexors: Normal.  Extensors: Normal.  Abductors: Normal.  Adductors: Normal.  Rotators: Normal.  Hamstrings: Normal.  Other Findings  None  Viscera  Asymmetrically enlarged prostate with a Foley catheter present there is a T2 hypointense nodule in the left side of the prostate towards the apex consistent with known prostate cancer. The regular wall thickening of the bladder. Partially visualized is aneurysmal dilatation of the infrarenal abdominal aorta measuring approximately 4 cm.  IMPRESSION: 1. No acute hip fracture, dislocation or avascular necrosis. 2. Multiple osseous lesions as described above, most consistent with metastatic disease. There is  a metastatic bone lesion involving the medial aspect of the right femoral neck which is high risk for a pathologic fracture. The right posterior iliac lesion, L4 vertebral body lesion and left iliac lesion are similar in appearance compared with the prior exam of 02/09/2014. 3. Prostatic enlargement and irregular bladder wall thickening.   Electronically Signed   By: Kathreen Devoid   On: 04/23/2014 08:23   Dg Foot Complete Right  04/18/2014   CLINICAL DATA:  Generalized weakness, prostate cancer metastatic to bone being tree with radiation and chemotherapy, weakness in legs beginning this morning, unable to stand, acute onset of BILATERAL leg pain at the femora, generalized new RIGHT foot pain  EXAM: RIGHT FOOT COMPLETE - 3+ VIEW  COMPARISON:  None  FINDINGS: Mild osseous demineralization.  Joint spaces preserved.  Small plantar calcaneal spur.  Small sclerotic focus within the mid tarsal navicular favoring bone island at this site.  No acute fracture, dislocation or bone destruction.  IMPRESSION: No definite acute osseous abnormalities.   Calcaneal spurring.  Probable bone island within tarsal navicular.   Electronically Signed   By: Lavonia Dana M.D.   On: 04/18/2014 13:12   Dg Femur Min 2 Views Left  04/18/2014   CLINICAL DATA:  Generalized weakness, bilateral leg pain, metastatic prostate cancer  EXAM: LEFT FEMUR 2 VIEWS  COMPARISON:  02/09/2014  FINDINGS: Four views of the left femur submitted. No acute fracture or subluxation. No destructive bony lesion. Stable sclerotic focus in left femoral head and left proximal femur without change from prior exam. This may be due to bone islands or treated metastasis.  IMPRESSION: Negative.   Electronically Signed   By: Lahoma Crocker M.D.   On: 04/18/2014 13:13   Dg Femur, Min 2 Views Right  04/18/2014   CLINICAL DATA:  Acute bilateral femur pain.  EXAM: RIGHT FEMUR 2 VIEWS  COMPARISON:  None.  FINDINGS: There is no evidence of fracture or other focal bone lesions. Soft tissues are unremarkable.  IMPRESSION: Normal right femur.   Electronically Signed   By: Marijo Conception, M.D.   On: 04/18/2014 13:08    Microbiology: Recent Results (from the past 240 hour(s))  Urine culture     Status: None   Collection Time: 04/20/14  3:08 PM  Result Value Ref Range Status   Specimen Description URINE, RANDOM  Final   Special Requests NONE  Final   Colony Count NO GROWTH Performed at [facility]   Final   Culture NO GROWTH Performed at [facility]   Final   Report Status 04/22/2014 FINAL  Final  Urine culture     Status: None   Collection Time: 04/21/14 10:52 AM  Result Value Ref Range Status   Specimen Description URINE, CATHETERIZED  Final   Special Requests NONE  Final   Colony Count NO GROWTH Performed at [facility]   Final   Culture NO GROWTH Performed at [facility]   Final   Report Status 04/22/2014 FINAL  Final  Culture, blood (routine x 2)     Status: None (Preliminary result)   Collection Time: 04/22/14  4:05 PM  Result Value Ref Range Status    Specimen Description BLOOD LEFT HAND  Final   Special Requests BOTTLES DRAWN AEROBIC AND ANAEROBIC CC  Final   Culture   Final           BLOOD CULTURE RECEIVED NO GROWTH TO DATE CULTURE WILL BE HELD FOR 5 DAYS  BEFORE ISSUING A FINAL NEGATIVE REPORT Performed at [facility]    Report Status PENDING  Incomplete  Culture, blood (routine x 2)     Status: None (Preliminary result)   Collection Time: 04/22/14  4:20 PM  Result Value Ref Range Status   Specimen Description BLOOD RIGHT HAND  Final   Special Requests BOTTLES DRAWN AEROBIC ONLY 10CC  Final   Culture   Final           BLOOD CULTURE RECEIVED NO GROWTH TO DATE CULTURE WILL BE HELD FOR 5 DAYS BEFORE ISSUING A FINAL NEGATIVE REPORT Performed at [facility]    Report Status PENDING  Incomplete     Labs: Basic Metabolic Panel:  Recent Labs Lab 04/18/14 1208 04/19/14 1942 04/19/14 1947 04/21/14 0512 04/23/14 0700  NA 142 140 142 138 137  K 3.8 4.0 4.0 4.0 4.1  CL 108 103 103 106 99  CO2 28 27  --  25 26  GLUCOSE 113* 123* 120* 116* 97  BUN 32* 30* 33* 26* 36*  CREATININE 1.24 1.42* 1.30 1.26 1.59*  CALCIUM 9.5 10.0  --  9.4 9.4   Liver Function Tests:  Recent Labs Lab 04/18/14 1208 04/19/14 1942 04/23/14 0700  AST 17 22 15   ALT 10 13 12   ALKPHOS 55 70 49  BILITOT 0.5 0.7 1.1  PROT 6.5 7.1 5.5*  ALBUMIN 3.0* 3.2* 2.2*   No results for input(s): LIPASE, AMYLASE in the last 168 hours. No results for input(s): AMMONIA in the last 168 hours. CBC:  Recent Labs Lab 04/18/14 1208 04/19/14 1942 04/19/14 1947 04/21/14 1145 04/23/14 0700  WBC 9.1 10.0  --  13.2* 10.4  NEUTROABS 5.9 5.8  --   --   --   HGB 13.1 13.7 15.6 13.3 11.1*  HCT 40.5 42.2 46.0 40.3 34.1*  MCV 100.7* 98.4  --  97.3 98.6  PLT 204 200  --  201 152   Cardiac Enzymes: No results for input(s): CKTOTAL, CKMB, CKMBINDEX, TROPONINI in the last 168 hours. BNP: BNP (last 3 results) No results for input(s): BNP in the last  8760 hours.  ProBNP (last 3 results)  Recent Labs  02/08/14 1148  PROBNP 6362.0*    CBG:  Recent Labs Lab 04/19/14 1957 04/20/14 1241  GLUCAP 123* 128*       Signed:  Preslynn Bier S  [facility] 04/23/2014, 1:02 PM

## 2014-04-24 ENCOUNTER — Telehealth: Payer: Self-pay | Admitting: Cardiology

## 2014-04-24 ENCOUNTER — Ambulatory Visit (INDEPENDENT_AMBULATORY_CARE_PROVIDER_SITE_OTHER): Payer: Medicare Other | Admitting: Cardiology

## 2014-04-24 DIAGNOSIS — I482 Chronic atrial fibrillation, unspecified: Secondary | ICD-10-CM

## 2014-04-24 DIAGNOSIS — Z5181 Encounter for therapeutic drug level monitoring: Secondary | ICD-10-CM

## 2014-04-24 LAB — POCT INR: INR: 1.5

## 2014-04-24 LAB — PSA: PSA: 6.53 ng/mL — AB (ref <=4.00)

## 2014-04-24 NOTE — Telephone Encounter (Signed)
See coumadin encounter March 2,2016

## 2014-04-24 NOTE — Telephone Encounter (Signed)
New Message  Tomasita Crumble with [facility]--called requests home health orders for [facility] and figure out how often the pt will need to be seen. Nurse also states that the wife wants the PTINr checked today 04/24/2014. Please call back to discuss

## 2014-04-24 NOTE — Telephone Encounter (Signed)
T/O for home health 3 times a week and coumadin check today per  Dr. Mare Ferrari  Patients PT 17.9, INR 1.5 Not currently taking any Warfarin, d/c from hospital yesterday Recent trauma with foley, per [facility] bag does appear to have some old looking blood  Patient currently taking Levaquin 250 mg, will finish Sunday Will forward to [facility]

## 2014-04-26 ENCOUNTER — Other Ambulatory Visit: Payer: Medicare Other

## 2014-04-28 LAB — CULTURE, BLOOD (ROUTINE X 2)
Culture: NO GROWTH
Culture: NO GROWTH

## 2014-04-29 ENCOUNTER — Ambulatory Visit: Payer: Medicare Other | Admitting: Cardiology

## 2014-04-29 ENCOUNTER — Ambulatory Visit (INDEPENDENT_AMBULATORY_CARE_PROVIDER_SITE_OTHER): Payer: Medicare Other | Admitting: Pharmacist

## 2014-04-29 DIAGNOSIS — I482 Chronic atrial fibrillation, unspecified: Secondary | ICD-10-CM

## 2014-04-29 DIAGNOSIS — Z5181 Encounter for therapeutic drug level monitoring: Secondary | ICD-10-CM

## 2014-04-29 LAB — POCT INR: INR: 1.5

## 2014-04-30 ENCOUNTER — Ambulatory Visit: Payer: Medicare Other | Admitting: Oncology

## 2014-04-30 ENCOUNTER — Other Ambulatory Visit: Payer: Medicare Other

## 2014-05-01 ENCOUNTER — Ambulatory Visit (HOSPITAL_COMMUNITY): Admission: RE | Admit: 2014-05-01 | Payer: Medicare Other | Source: Ambulatory Visit

## 2014-05-01 ENCOUNTER — Ambulatory Visit (HOSPITAL_COMMUNITY): Payer: Medicare Other

## 2014-05-06 ENCOUNTER — Other Ambulatory Visit: Payer: Self-pay | Admitting: Radiation Oncology

## 2014-05-06 ENCOUNTER — Ambulatory Visit (INDEPENDENT_AMBULATORY_CARE_PROVIDER_SITE_OTHER): Payer: Medicare Other | Admitting: Internal Medicine

## 2014-05-06 ENCOUNTER — Other Ambulatory Visit: Payer: Self-pay

## 2014-05-06 DIAGNOSIS — I482 Chronic atrial fibrillation, unspecified: Secondary | ICD-10-CM

## 2014-05-06 DIAGNOSIS — Z5181 Encounter for therapeutic drug level monitoring: Secondary | ICD-10-CM

## 2014-05-06 LAB — POCT INR: INR: 1.8

## 2014-05-06 MED ORDER — LOSARTAN POTASSIUM 100 MG PO TABS: 100.0000 mg | ORAL_TABLET | Freq: Every day | ORAL | Status: DC

## 2014-05-08 ENCOUNTER — Telehealth: Payer: Self-pay | Admitting: Cardiology

## 2014-05-08 ENCOUNTER — Other Ambulatory Visit: Payer: Self-pay | Admitting: Cardiology

## 2014-05-08 NOTE — Telephone Encounter (Signed)
Agree 

## 2014-05-08 NOTE — Telephone Encounter (Signed)
Advised ok to decrease to once a week next week Craig [name] will call back if anything changes

## 2014-05-08 NOTE — Telephone Encounter (Signed)
New Msg         Tomasita Crumble from [facility] calling, states she only needs to see pt once a week.    Orders are needed for this to take place. States pt is doing much better.    Please return call.

## 2014-05-16 ENCOUNTER — Ambulatory Visit (INDEPENDENT_AMBULATORY_CARE_PROVIDER_SITE_OTHER): Payer: Medicare Other | Admitting: Interventional Cardiology

## 2014-05-16 DIAGNOSIS — I482 Chronic atrial fibrillation, unspecified: Secondary | ICD-10-CM

## 2014-05-16 DIAGNOSIS — Z5181 Encounter for therapeutic drug level monitoring: Secondary | ICD-10-CM

## 2014-05-16 LAB — POCT INR: INR: 2.6

## 2014-05-17 ENCOUNTER — Ambulatory Visit
Admission: RE | Admit: 2014-05-17 | Discharge: 2014-05-17 | Disposition: A | Discharge status: Home / Self Care | Payer: Medicare Other | Source: Ambulatory Visit | Attending: Radiation Oncology | Admitting: Radiation Oncology

## 2014-05-17 DIAGNOSIS — C61 Malignant neoplasm of prostate: Secondary | ICD-10-CM | POA: Diagnosis present

## 2014-05-17 DIAGNOSIS — C7951 Secondary malignant neoplasm of bone: Secondary | ICD-10-CM

## 2014-05-20 ENCOUNTER — Telehealth: Payer: Self-pay | Admitting: Oncology

## 2014-05-20 NOTE — Telephone Encounter (Signed)
Pt's wife called to r/s after radiation, [name] confirmed labs/ov .Marland Kitchen... KJ

## 2014-05-23 ENCOUNTER — Ambulatory Visit (INDEPENDENT_AMBULATORY_CARE_PROVIDER_SITE_OTHER): Payer: Medicare Other | Admitting: Cardiology

## 2014-05-23 DIAGNOSIS — I482 Chronic atrial fibrillation, unspecified: Secondary | ICD-10-CM

## 2014-05-23 DIAGNOSIS — Z5181 Encounter for therapeutic drug level monitoring: Secondary | ICD-10-CM

## 2014-05-23 LAB — POCT INR: INR: 2

## 2014-05-24 ENCOUNTER — Telehealth: Payer: Self-pay | Admitting: *Deleted

## 2014-05-24 NOTE — Telephone Encounter (Signed)
Wife called asking since patient will be starting rad tx to his right femur on Monday,should he start the decadron and protonix as well  From last time he received rad txs, I returned her call stating MD out of office till MOnday, but willin basket MD and will let her know Monday her questions of these meds, wife thanked this Rn for calling back so soon, will addrees on Monday 9:57 AM

## 2014-05-27 ENCOUNTER — Ambulatory Visit
Admission: RE | Admit: 2014-05-27 | Discharge: 2014-05-27 | Disposition: A | Discharge status: Home / Self Care | Payer: Medicare Other | Source: Ambulatory Visit | Attending: Radiation Oncology | Admitting: Radiation Oncology

## 2014-05-27 ENCOUNTER — Telehealth: Payer: Self-pay | Admitting: *Deleted

## 2014-05-27 DIAGNOSIS — C61 Malignant neoplasm of prostate: Secondary | ICD-10-CM | POA: Diagnosis not present

## 2014-05-27 NOTE — Telephone Encounter (Signed)
Called patient home, spoke with Wife, [name], Dr. [name] doesn't need to start Protonix or Decadron at this time per Dr. Lisbeth Renshaw, wife thanked this RN and Md for getting back so soon 10:03 AM

## 2014-05-28 ENCOUNTER — Ambulatory Visit
Admission: RE | Admit: 2014-05-28 | Discharge: 2014-05-28 | Disposition: A | Discharge status: Home / Self Care | Payer: Medicare Other | Source: Ambulatory Visit | Attending: Radiation Oncology | Admitting: Radiation Oncology

## 2014-05-28 DIAGNOSIS — C61 Malignant neoplasm of prostate: Secondary | ICD-10-CM | POA: Diagnosis not present

## 2014-05-29 ENCOUNTER — Encounter: Payer: Self-pay | Admitting: Radiation Oncology

## 2014-05-29 ENCOUNTER — Ambulatory Visit
Admission: RE | Admit: 2014-05-29 | Discharge: 2014-05-29 | Disposition: A | Discharge status: Home / Self Care | Payer: Medicare Other | Source: Ambulatory Visit | Attending: Radiation Oncology | Admitting: Radiation Oncology

## 2014-05-29 VITALS — BP 176/72 | HR 75 | Temp 97.6°F | Resp 20

## 2014-05-29 DIAGNOSIS — C61 Malignant neoplasm of prostate: Secondary | ICD-10-CM | POA: Diagnosis not present

## 2014-05-29 DIAGNOSIS — C7951 Secondary malignant neoplasm of bone: Secondary | ICD-10-CM

## 2014-05-29 NOTE — Progress Notes (Signed)
   Department of Radiation Oncology  Phone:  [phone] Fax:        [phone]  Weekly Treatment Note    Name: Craig [name]. Date: 05/29/2014 MRN: [id number] DOB: Mar 14, 1924   Current dose: 12 Gy  Current fraction: 3   MEDICATIONS: Current Outpatient Prescriptions  Medication Sig Dispense Refill  . acetaminophen (TYLENOL) 500 MG tablet Take 500 mg by mouth 2 (two) times daily. Takes one in the AM and one qhs    . amLODipine (NORVASC) 5 MG tablet Take 1 tablet (5 mg total) by mouth daily. 90 tablet 1  . atorvastatin (LIPITOR) 10 MG tablet Take 1 tablet (10 mg total) by mouth daily. 90 tablet 11  . B Complex-C (B-COMPLEX WITH VITAMIN C) tablet Take 1 tablet by mouth daily.      . Denosumab (XGEVA [place]) Inject into the skin every 30 (thirty) days. monthly    . Docusate Calcium (STOOL SOFTENER PO) Take 1 capsule by mouth 2 (two) times daily.    . furosemide (LASIX) 40 MG tablet Take 1 tablet (40 mg total) by mouth daily. 90 tablet 3  . Leuprolide Acetate (LUPRON DEPOT IM) Inject 1 each into the muscle every 6 (six) months.     Marland Kitchen losartan (COZAAR) 100 MG tablet Take 1 tablet (100 mg total) by mouth daily. 30 tablet 2  . multivitamin (THERAGRAN) per tablet Take 1 tablet by mouth daily. ( with B - Complex )    . Omega-3 Fatty Acids (FISH OIL PO) Take 1 capsule by mouth daily. ( Mega Red )    . tamsulosin (FLOMAX) 0.4 MG CAPS capsule Take 1 capsule (0.4 mg total) by mouth daily. 30 capsule 2  . warfarin (COUMADIN) 5 MG tablet Take 1 tablet (5 mg total) by mouth as directed. (Patient taking differently: Take 2.5-5 mg by mouth daily. 5mg  daily on Monday and  Wednesday, 2.5mg  daily all other days) 30 tablet 3  . XTANDI 40 MG capsule TAKE 2 CAPSULES BY MOUTH DAILY 60 capsule 0  . ZETIA 10 MG tablet TAKE 1 TABLET BY MOUTH ONCE DAILY 30 tablet 5   No current facility-administered medications for this encounter.     ALLERGIES: Pravachol and Zocor   LABORATORY DATA:  Lab Results    Component Value Date   WBC 10.4 04/23/2014   HGB 11.1* 04/23/2014   HCT 34.1* 04/23/2014   MCV 98.6 04/23/2014   PLT 152 04/23/2014   Lab Results  Component Value Date   NA 137 04/23/2014   K 4.1 04/23/2014   CL 99 04/23/2014   CO2 26 04/23/2014   Lab Results  Component Value Date   ALT 12 04/23/2014   AST 15 04/23/2014   ALKPHOS 49 04/23/2014   BILITOT 1.1 04/23/2014     NARRATIVE: Craig [name]. was seen today for weekly treatment management. The chart was checked and the patient's films were reviewed.  The patient is doing very well at this time. He relates no difficulties with treatment. No nausea. No skin discomfort.  PHYSICAL EXAMINATION: oral temperature is 97.6 F (36.4 C). His blood pressure is 176/72 and his pulse is 75. His respiration is 20.        ASSESSMENT: The patient is doing satisfactorily with treatment.  PLAN: We will continue with the patient's radiation treatment as planned.

## 2014-05-29 NOTE — Progress Notes (Signed)
Patient denies pain, loss of appetite. He cannot bear weight on right leg, so he was not weighed. BP 176/72 mmHg  Pulse 75  Temp(Src) 97.6 F (36.4 C) (Oral)  Resp 20  Wt

## 2014-05-30 ENCOUNTER — Ambulatory Visit
Admission: RE | Admit: 2014-05-30 | Discharge: 2014-05-30 | Disposition: A | Discharge status: Home / Self Care | Payer: Medicare Other | Source: Ambulatory Visit | Attending: Radiation Oncology | Admitting: Radiation Oncology

## 2014-05-30 DIAGNOSIS — C61 Malignant neoplasm of prostate: Secondary | ICD-10-CM | POA: Diagnosis not present

## 2014-05-31 ENCOUNTER — Encounter: Payer: Self-pay | Admitting: Radiation Oncology

## 2014-05-31 ENCOUNTER — Ambulatory Visit
Admission: RE | Admit: 2014-05-31 | Discharge: 2014-05-31 | Disposition: A | Discharge status: Home / Self Care | Payer: Medicare Other | Source: Ambulatory Visit | Attending: Radiation Oncology | Admitting: Radiation Oncology

## 2014-05-31 DIAGNOSIS — C61 Malignant neoplasm of prostate: Secondary | ICD-10-CM | POA: Diagnosis not present

## 2014-06-05 ENCOUNTER — Ambulatory Visit (INDEPENDENT_AMBULATORY_CARE_PROVIDER_SITE_OTHER): Payer: Medicare Other | Admitting: Cardiology

## 2014-06-05 DIAGNOSIS — I482 Chronic atrial fibrillation, unspecified: Secondary | ICD-10-CM

## 2014-06-05 DIAGNOSIS — Z5181 Encounter for therapeutic drug level monitoring: Secondary | ICD-10-CM

## 2014-06-05 LAB — POCT INR: INR: 1.6

## 2014-06-11 ENCOUNTER — Other Ambulatory Visit (HOSPITAL_BASED_OUTPATIENT_CLINIC_OR_DEPARTMENT_OTHER): Payer: Medicare Other

## 2014-06-11 ENCOUNTER — Ambulatory Visit (HOSPITAL_BASED_OUTPATIENT_CLINIC_OR_DEPARTMENT_OTHER): Payer: Medicare Other | Admitting: Oncology

## 2014-06-11 ENCOUNTER — Telehealth: Payer: Self-pay | Admitting: Oncology

## 2014-06-11 VITALS — BP 157/65 | HR 84 | Temp 97.6°F | Resp 18

## 2014-06-11 DIAGNOSIS — E291 Testicular hypofunction: Secondary | ICD-10-CM | POA: Diagnosis not present

## 2014-06-11 DIAGNOSIS — C7951 Secondary malignant neoplasm of bone: Secondary | ICD-10-CM

## 2014-06-11 DIAGNOSIS — C61 Malignant neoplasm of prostate: Secondary | ICD-10-CM

## 2014-06-11 LAB — CBC WITH DIFFERENTIAL/PLATELET
BASO%: 0.2 % (ref 0.0–2.0)
BASOS ABS: 0 10*3/uL (ref 0.0–0.1)
EOS%: 2.6 % (ref 0.0–7.0)
Eosinophils Absolute: 0.2 10*3/uL (ref 0.0–0.5)
HCT: 38.9 % (ref 38.4–49.9)
HEMOGLOBIN: 12.7 g/dL — AB (ref 13.0–17.1)
LYMPH%: 20.6 % (ref 14.0–49.0)
MCH: 32 pg (ref 27.2–33.4)
MCHC: 32.6 g/dL (ref 32.0–36.0)
MCV: 98 fL (ref 79.3–98.0)
MONO#: 0.9 10*3/uL (ref 0.1–0.9)
MONO%: 10.4 % (ref 0.0–14.0)
NEUT#: 5.9 10*3/uL (ref 1.5–6.5)
NEUT%: 66.2 % (ref 39.0–75.0)
PLATELETS: 157 10*3/uL (ref 140–400)
RBC: 3.97 10*6/uL — ABNORMAL LOW (ref 4.20–5.82)
RDW: 14.2 % (ref 11.0–14.6)
WBC: 8.9 10*3/uL (ref 4.0–10.3)
lymph#: 1.8 10*3/uL (ref 0.9–3.3)

## 2014-06-11 LAB — COMPREHENSIVE METABOLIC PANEL (CC13)
ALBUMIN: 2.9 g/dL — AB (ref 3.5–5.0)
ALT: 11 U/L (ref 0–55)
ANION GAP: 8 meq/L (ref 3–11)
AST: 14 U/L (ref 5–34)
Alkaline Phosphatase: 61 U/L (ref 40–150)
BUN: 36.7 mg/dL — ABNORMAL HIGH (ref 7.0–26.0)
CALCIUM: 10.1 mg/dL (ref 8.4–10.4)
CO2: 28 meq/L (ref 22–29)
Chloride: 103 mEq/L (ref 98–109)
Creatinine: 1.4 mg/dL — ABNORMAL HIGH (ref 0.7–1.3)
EGFR: 46 mL/min/{1.73_m2} — ABNORMAL LOW (ref >90)
Glucose: 99 mg/dl (ref 70–140)
POTASSIUM: 3.9 meq/L (ref 3.5–5.1)
Sodium: 139 mEq/L (ref 136–145)
Total Bilirubin: 0.41 mg/dL (ref 0.20–1.20)
Total Protein: 6.3 g/dL — ABNORMAL LOW (ref 6.4–8.3)

## 2014-06-11 NOTE — Telephone Encounter (Signed)
Gave avs & calendar for June. °

## 2014-06-11 NOTE — Progress Notes (Signed)
Hematology and Oncology Follow Up Visit  Craig [name] [id number] [birth year] 81 y.o. 06/11/2014 1:46 PM Craig [name], MDBrackbill, Craig Moores, MD   Principle Diagnosis: 81 year old with Castration resistant prostate cancer with metastatic disease to the bone. He was initially diagnosed in 2011 PSA of 19 and presented with advanced disease.   Prior Therapy: He is status post combined androgen deprivation with Lupron and Casodex with an excellent response initially and had a PSA nadir down to 1.67. Most recently he developed progression of disease and a PSA up to 5.94 in September of 2014. He is status post SRS treatment to the L4 completed on 02/13/2014. He is S/P stereotactic body radiotherapy to the iliac bone treatment completed in 02/2014. He is status post radiation therapy to the right femur completed on 06/07/2014.  Current therapy: He is on Xtandi started on 11/15/2012. He was started on 160 mg initially but the dose was reduced to 80 mg in December of 2014. Treatment has been on hold for the last 2 months. He continues to be on Lupron and Xgeva done at [facility]. .  Interim History: Craig [name] presents today for a followup visit with his wife. Since the last visit, he completed radiation to right femur. He was hospitalized for a CVA and MRI of the lumbar spine and the pelvis showed right femur metastasis. Since his discharge, he continues to be rather weak and wheelchair bound he is not taking Xtandi as mentioned. He is not reporting any hip pain or back pain at this point. He is not reporting any knee pain despite multiple falls on his left knee.   He is reporting some improvement at this time. His appetite has been excellent and drinks one boost a day.Has not reported any cough or hemoptysis or hematemesis. Does not report any nausea or vomiting or abdominal pain.Is not reporting any back pain or neurological symptoms. Does not report any headaches or blurry vision or  double vision. Is not reporting any chest pain shortness of breath or difficulty breathing. Does not report any constipation or diarrhea. Does not report any hematochezia or melena. Does not report any skin rashes or lesions. Rest of his review of system is unremarkable.  Medications: I have reviewed the patient's current medications.  Current Outpatient Prescriptions  Medication Sig Dispense Refill  . acetaminophen (TYLENOL) 500 MG tablet Take 500 mg by mouth 2 (two) times daily. Takes one in the AM and one qhs    . amLODipine (NORVASC) 5 MG tablet Take 1 tablet (5 mg total) by mouth daily. 90 tablet 1  . atorvastatin (LIPITOR) 10 MG tablet Take 1 tablet (10 mg total) by mouth daily. 90 tablet 11  . B Complex-C (B-COMPLEX WITH VITAMIN C) tablet Take 1 tablet by mouth daily.      . Denosumab (XGEVA [place]) Inject into the skin every 30 (thirty) days. monthly    . Docusate Calcium (STOOL SOFTENER PO) Take 1 capsule by mouth 2 (two) times daily.    . furosemide (LASIX) 40 MG tablet Take 1 tablet (40 mg total) by mouth daily. 90 tablet 3  . Leuprolide Acetate (LUPRON DEPOT IM) Inject 1 each into the muscle every 6 (six) months.     Marland Kitchen losartan (COZAAR) 100 MG tablet Take 1 tablet (100 mg total) by mouth daily. 30 tablet 2  . multivitamin (THERAGRAN) per tablet Take 1 tablet by mouth daily. ( with B - Complex )    . Omega-3 Fatty Acids (FISH  OIL PO) Take 1 capsule by mouth daily. ( Mega Red )    . tamsulosin (FLOMAX) 0.4 MG CAPS capsule Take 1 capsule (0.4 mg total) by mouth daily. 30 capsule 2  . warfarin (COUMADIN) 5 MG tablet Take 1 tablet (5 mg total) by mouth as directed. (Patient taking differently: Take 2.5-5 mg by mouth daily. 5mg  daily on Monday and  Wednesday, 2.5mg  daily all other days) 30 tablet 3  . ZETIA 10 MG tablet TAKE 1 TABLET BY MOUTH ONCE DAILY 30 tablet 5   No current facility-administered medications for this visit.     Allergies:  Allergies  Allergen Reactions  . Pravachol      Muscle weakness  . Zocor [Simvastatin - High Dose]     Muscle weakness    Past Medical History, Surgical history, Social history, and Family History were reviewed and updated.   Physical Exam: Blood pressure 157/65, pulse 84, temperature 97.6 F (36.4 C), temperature source Oral, resp. rate 18, SpO2 100 %. ECOG: 2 General appearance: alert, NAD. Head: Normocephalic, without obvious abnormality Neck: no adenopathy Lymph nodes: Cervical, supraclavicular, and axillary nodes normal. Heart:regular rate and rhythm, S1, S2 normal, no murmur, click, rub or gallop Lung:chest clear, no wheezing, rales, no dullness to percussion. Abdomin: soft, non-tender, without masses or organomegaly. Good bowel sounds. EXT: 1+ edema noted bilaterally. Neurological examination: No deficits noted.  Lab Results: Lab Results  Component Value Date   WBC 8.9 06/11/2014   HGB 12.7* 06/11/2014   HCT 38.9 06/11/2014   MCV 98.0 06/11/2014   PLT 157 06/11/2014     Chemistry      Component Value Date/Time   NA 137 04/23/2014 0700   NA 144 03/22/2014 0959   K 4.1 04/23/2014 0700   K 4.4 03/22/2014 0959   CL 99 04/23/2014 0700   CO2 26 04/23/2014 0700   CO2 27 03/22/2014 0959   BUN 36* 04/23/2014 0700   BUN 28.4* 03/22/2014 0959   CREATININE 1.59* 04/23/2014 0700   CREATININE 1.4* 03/22/2014 0959      Component Value Date/Time   CALCIUM 9.4 04/23/2014 0700   CALCIUM 9.9 03/22/2014 0959   ALKPHOS 49 04/23/2014 0700   ALKPHOS 62 03/22/2014 0959   AST 15 04/23/2014 0700   AST 15 03/22/2014 0959   ALT 12 04/23/2014 0700   ALT 8 03/22/2014 0959   BILITOT 1.1 04/23/2014 0700   BILITOT 0.83 03/22/2014 0959         Results for AAREN, ATALLAH (MRN [id number]) as of 06/11/2014 13:17  Ref. Range 01/16/2014 11:13 02/19/2014 14:24 04/23/2014 07:00  PSA Latest Ref Range: <=4.00 ng/mL 4.91 (H) 11.13 (H) 6.53 (H)       Impression and Plan:  81 year old gentleman with the following issues:    1. Castration resistant prostate cancer with metastatic disease to the bone. He is currently on reduced dose Xtandi with intermittent breaks. He has been off Xtandi since February 2016 and his most recent PSA on March 1 did drop down to 6.53. The plan is to repeat his PSA today and restart xtandi at 40 mg daily if his PSA is rising again. If he could not tolerate this dose that I will switch him to [facility].  2. Androgen depravation: He is to continue Lupron at this time. He was recently given at [facility]  3. Bone directed therapy: I recommend him to continue Xgeva which she has been getting on a monthly basis. This is given at [facility].  4. Hypercalcemia: His calcium level is pending today but elevated on previous testing.  5. Bony metastasis: He have received radiation therapy to the lumbar spine as well as the right femur.  6. Followup: Will be in 4  weeks.  [facility], MD 4/19/20161:46 PM

## 2014-06-12 ENCOUNTER — Telehealth: Payer: Self-pay | Admitting: *Deleted

## 2014-06-12 LAB — PSA: PSA: 9.27 ng/mL — AB (ref <=4.00)

## 2014-06-12 NOTE — Telephone Encounter (Signed)
-----   Message from Wyatt Portela, MD sent at 06/12/2014  8:09 AM EDT ----- Please let his wife know that PSA went up to 9.27. I recomnd trying Xtandi 40 daily like we discussed yesterday.

## 2014-06-12 NOTE — Telephone Encounter (Signed)
Spoke with patient's wife, betty. Gave results of PSA. Per dr Alen Blew, start patient back on xtandi 40 mg daily, as discussed in yesterday's office visit. [name] verbalized understanding.

## 2014-06-14 NOTE — Progress Notes (Signed)
  Radiation Oncology         [phone]) [phone] ________________________________  Name: Craig [name]. MRN: [id number]  Date: 05/31/2014  DOB: 02/21/1925  End of Treatment Note  Diagnosis:   Metastatic prostate cancer     Indication for treatment:  Palliative       Radiation treatment dates:   05/27/2014 through 05/31/2014  Site/dose:   The patient was treated to the right femur to an area of metastasis to a dose of 20 gray in 5 fractions at 4 gray per fraction. He was treated with AP PA fields.  Narrative: The patient tolerated radiation treatment relatively well.   No signs of acute toxicity during treatment.   Plan: The patient has completed radiation treatment. The patient will return to radiation oncology clinic for routine followup in one month. I advised the patient to call or return sooner if they have any questions or concerns related to their recovery or treatment. ________________________________  Jodelle Gross, M.D., Ph.D.

## 2014-06-14 NOTE — Progress Notes (Signed)
  Radiation Oncology         [phone]) [phone] ________________________________  Name: Craig [name]. MRN: [id number]  Date: 05/17/2014  DOB: 1924/03/18  SIMULATION AND TREATMENT PLANNING NOTE  DIAGNOSIS:  Metastatic prostate cancer  Site:  Right femur  NARRATIVE:  The patient was brought to the [facility].  Identity was confirmed.  All relevant records and images related to the planned course of therapy were reviewed.   Written consent to proceed with treatment was confirmed which was freely given after reviewing the details related to the planned course of therapy had been reviewed with the patient.  Then, the patient was set-up in a stable reproducible  supine position for radiation therapy.  CT images were obtained.  Surface markings were placed.    Medically necessary complex treatment device(s) for immobilization:  Customized vac lock bag.   The CT images were loaded into the planning software.  Then the target and avoidance structures were contoured.  Treatment planning then occurred.  The radiation prescription was entered and confirmed.  A total of 2 complex treatment devices were fabricated which relate to the designed radiation treatment fields. Each of these customized fields/ complex treatment devices will be used on a daily basis during the radiation course. I have requested : Isodose Plan.   PLAN:  The patient will receive 20 Gy in 5 fractions.  ________________________________   Jodelle Gross, MD, PhD

## 2014-06-14 NOTE — Addendum Note (Signed)
Encounter addended by: Kyung Rudd, MD on: 06/14/2014  5:49 PM<BR>     Documentation filed: Notes Section, Visit Diagnoses

## 2014-06-17 ENCOUNTER — Ambulatory Visit (INDEPENDENT_AMBULATORY_CARE_PROVIDER_SITE_OTHER): Payer: Medicare Other | Admitting: Pharmacist

## 2014-06-17 DIAGNOSIS — I482 Chronic atrial fibrillation, unspecified: Secondary | ICD-10-CM

## 2014-06-17 DIAGNOSIS — Z5181 Encounter for therapeutic drug level monitoring: Secondary | ICD-10-CM

## 2014-06-17 LAB — POCT INR: INR: 1.8

## 2014-07-01 ENCOUNTER — Encounter: Payer: Self-pay | Admitting: Radiation Oncology

## 2014-07-01 ENCOUNTER — Ambulatory Visit: Payer: Self-pay | Admitting: Radiation Oncology

## 2014-07-01 ENCOUNTER — Ambulatory Visit (INDEPENDENT_AMBULATORY_CARE_PROVIDER_SITE_OTHER): Payer: Medicare Other | Admitting: *Deleted

## 2014-07-01 DIAGNOSIS — I482 Chronic atrial fibrillation, unspecified: Secondary | ICD-10-CM

## 2014-07-01 DIAGNOSIS — I4891 Unspecified atrial fibrillation: Secondary | ICD-10-CM | POA: Diagnosis not present

## 2014-07-01 DIAGNOSIS — Z5181 Encounter for therapeutic drug level monitoring: Secondary | ICD-10-CM

## 2014-07-01 LAB — POCT INR: INR: 2

## 2014-07-03 ENCOUNTER — Ambulatory Visit
Admission: RE | Admit: 2014-07-03 | Discharge: 2014-07-03 | Disposition: A | Discharge status: Home / Self Care | Payer: Medicare Other | Source: Ambulatory Visit | Attending: Radiation Oncology | Admitting: Radiation Oncology

## 2014-07-03 ENCOUNTER — Encounter: Payer: Self-pay | Admitting: Radiation Oncology

## 2014-07-03 VITALS — BP 165/76 | HR 73 | Temp 97.7°F | Resp 20 | Ht 72.0 in

## 2014-07-03 DIAGNOSIS — C7951 Secondary malignant neoplasm of bone: Secondary | ICD-10-CM

## 2014-07-03 NOTE — Progress Notes (Signed)
Follow up s/p rad txs right femur 05/27/14-05/31/14, no c/o pain, appetite good, in w/c, difficult balance,unable to weigh today, Xgeva next due 07/16/14, last Lupron injection March 2016, sees Dr. 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

## 2014-07-03 NOTE — Progress Notes (Signed)
Radiation Oncology         [phone]) [phone] ________________________________  Name: Craig [name]. MRN: [id number]  Date: 07/03/2014  DOB: Mar 02, 1924  Follow-Up Visit Note  CC: Craig Danes, MD  Craig Corning, MD  Diagnosis:   Metastatic prostate cancer  Interval Since Last Radiation:  1 month   Narrative:  The patient returns today for routine follow-up.  Occasional pain maybe due to arthritic pain per family member.Follow up s/p rad txs right femur 05/27/14-05/31/14, no c/o pain, appetite good, in w/c, difficult balance,unable to weigh today, Xgeva next due 07/16/14, last Lupron injection March 2016, sees Dr. 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 Readings from Last 3 Encounters: 04/19/14          184 lb 11.2 oz (83.779 kg) 03/22/14          200 lb 12.8 oz (91.082 kg) 03/01/14          197 lb (89.359 kg)              ALLERGIES:  is allergic to pravachol and zocor.  Meds: Current Outpatient Prescriptions  Medication Sig Dispense Refill  . acetaminophen (TYLENOL) 500 MG tablet Take 500 mg by mouth 2 (two) times daily. Takes one in the AM and one qhs    . amLODipine (NORVASC) 5 MG tablet Take 1 tablet (5 mg total) by mouth daily. 90 tablet 1  . atorvastatin (LIPITOR) 10 MG tablet Take 1 tablet (10 mg total) by mouth daily. 90 tablet 11  . B Complex-C (B-COMPLEX WITH VITAMIN C) tablet Take 1 tablet by mouth daily.      . Denosumab (XGEVA [place]) Inject into the skin every 30 (thirty) days. monthly    . Docusate Calcium (STOOL SOFTENER PO) Take 1 capsule by mouth 2 (two) times daily.    . furosemide (LASIX) 40 MG tablet Take 1 tablet (40 mg total) by mouth daily. 90 tablet 3  . Leuprolide Acetate (LUPRON DEPOT IM) Inject 1 each into the muscle every 6 (six) months.     Marland Kitchen  losartan (COZAAR) 100 MG tablet Take 1 tablet (100 mg total) by mouth daily. 30 tablet 2  . multivitamin (THERAGRAN) per tablet Take 1 tablet by mouth daily. ( with B - Complex )    . Omega-3 Fatty Acids (FISH OIL PO) Take 1 capsule by mouth daily. ( Mega Red )    . tamsulosin (FLOMAX) 0.4 MG CAPS capsule Take 1 capsule (0.4 mg total) by mouth daily. 30 capsule 2  . warfarin (COUMADIN) 5 MG tablet Take 1 tablet (5 mg total) by mouth as directed. (Patient taking differently: Take 2.5-5 mg by mouth daily. 5mg  daily on Monday and  Wednesday, 2.5mg  daily all other days) 30 tablet 3  . ZETIA 10 MG tablet TAKE 1 TABLET BY MOUTH ONCE DAILY 30 tablet 5   No current facility-administered medications for this encounter.    Physical Findings: The patient is in no acute distress. Patient is alert and oriented.  height is 6' (1.829 m). His oral temperature is 97.7 F (36.5 C). His blood pressure is 165/76 and his pulse is 73. His respiration is 20 and oxygen saturation is 100%.     Lab Findings:  Lab Results  Component Value Date   WBC 8.9 06/11/2014   HGB 12.7* 06/11/2014   HCT 38.9 06/11/2014   MCV 98.0 06/11/2014   PLT 157 06/11/2014    Radiographic Findings: No results found.  Impression:    Patient looks good and no significant changes noted on x-ray.    Plan:  Follow up with Rad. Onc as needed. Released patient to Dr. Alen [name]. I would be happy to see the patient for further follow-up or to coordinate additional scans as necessary after he sees Dr. Alen [name].    This document serves as a record of services personally performed by Kyung Rudd, MD. It was created on his behalf by Jeralene Peters, a trained medical scribe. The creation of this record is based on the scribe's personal observations and the provider's statements to them. This document has been checked and approved by the attending provider.   Jodelle Gross, M.D., Ph.D.

## 2014-07-11 ENCOUNTER — Inpatient Hospital Stay (HOSPITAL_COMMUNITY)
Admission: EM | Admit: 2014-07-11 | Discharge: 2014-07-19 | DRG: 166 | Disposition: A | Discharge status: Home / Self Care | Payer: Medicare Other | Attending: Internal Medicine | Admitting: Internal Medicine

## 2014-07-11 ENCOUNTER — Emergency Department (HOSPITAL_COMMUNITY): Payer: Medicare Other

## 2014-07-11 ENCOUNTER — Encounter (HOSPITAL_COMMUNITY): Payer: Self-pay | Admitting: Emergency Medicine

## 2014-07-11 DIAGNOSIS — Z961 Presence of intraocular lens: Secondary | ICD-10-CM | POA: Diagnosis present

## 2014-07-11 DIAGNOSIS — J40 Bronchitis, not specified as acute or chronic: Secondary | ICD-10-CM | POA: Diagnosis present

## 2014-07-11 DIAGNOSIS — I482 Chronic atrial fibrillation, unspecified: Secondary | ICD-10-CM | POA: Diagnosis present

## 2014-07-11 DIAGNOSIS — I5032 Chronic diastolic (congestive) heart failure: Secondary | ICD-10-CM | POA: Diagnosis present

## 2014-07-11 DIAGNOSIS — R042 Hemoptysis: Secondary | ICD-10-CM | POA: Diagnosis present

## 2014-07-11 DIAGNOSIS — Z9842 Cataract extraction status, left eye: Secondary | ICD-10-CM | POA: Diagnosis not present

## 2014-07-11 DIAGNOSIS — E785 Hyperlipidemia, unspecified: Secondary | ICD-10-CM | POA: Diagnosis present

## 2014-07-11 DIAGNOSIS — Z87891 Personal history of nicotine dependence: Secondary | ICD-10-CM

## 2014-07-11 DIAGNOSIS — I272 Other secondary pulmonary hypertension: Secondary | ICD-10-CM | POA: Diagnosis present

## 2014-07-11 DIAGNOSIS — K5900 Constipation, unspecified: Secondary | ICD-10-CM | POA: Diagnosis not present

## 2014-07-11 DIAGNOSIS — J189 Pneumonia, unspecified organism: Secondary | ICD-10-CM | POA: Diagnosis present

## 2014-07-11 DIAGNOSIS — I160 Hypertensive urgency: Secondary | ICD-10-CM | POA: Diagnosis present

## 2014-07-11 DIAGNOSIS — Z888 Allergy status to other drugs, medicaments and biological substances status: Secondary | ICD-10-CM | POA: Diagnosis not present

## 2014-07-11 DIAGNOSIS — J9601 Acute respiratory failure with hypoxia: Secondary | ICD-10-CM | POA: Diagnosis present

## 2014-07-11 DIAGNOSIS — C61 Malignant neoplasm of prostate: Secondary | ICD-10-CM | POA: Diagnosis present

## 2014-07-11 DIAGNOSIS — M25539 Pain in unspecified wrist: Secondary | ICD-10-CM

## 2014-07-11 DIAGNOSIS — Z9841 Cataract extraction status, right eye: Secondary | ICD-10-CM | POA: Diagnosis not present

## 2014-07-11 DIAGNOSIS — M25531 Pain in right wrist: Secondary | ICD-10-CM

## 2014-07-11 DIAGNOSIS — T502X5A Adverse effect of carbonic-anhydrase inhibitors, benzothiadiazides and other diuretics, initial encounter: Secondary | ICD-10-CM | POA: Diagnosis not present

## 2014-07-11 DIAGNOSIS — N183 Chronic kidney disease, stage 3 unspecified: Secondary | ICD-10-CM | POA: Diagnosis present

## 2014-07-11 DIAGNOSIS — I2782 Chronic pulmonary embolism: Secondary | ICD-10-CM | POA: Diagnosis present

## 2014-07-11 DIAGNOSIS — K219 Gastro-esophageal reflux disease without esophagitis: Secondary | ICD-10-CM | POA: Diagnosis present

## 2014-07-11 DIAGNOSIS — N179 Acute kidney failure, unspecified: Secondary | ICD-10-CM | POA: Diagnosis not present

## 2014-07-11 DIAGNOSIS — I2699 Other pulmonary embolism without acute cor pulmonale: Secondary | ICD-10-CM | POA: Diagnosis present

## 2014-07-11 DIAGNOSIS — C7951 Secondary malignant neoplasm of bone: Secondary | ICD-10-CM | POA: Diagnosis present

## 2014-07-11 DIAGNOSIS — G43909 Migraine, unspecified, not intractable, without status migrainosus: Secondary | ICD-10-CM | POA: Diagnosis present

## 2014-07-11 DIAGNOSIS — M109 Gout, unspecified: Secondary | ICD-10-CM | POA: Diagnosis present

## 2014-07-11 DIAGNOSIS — Z923 Personal history of irradiation: Secondary | ICD-10-CM

## 2014-07-11 DIAGNOSIS — I130 Hypertensive heart and chronic kidney disease with heart failure and stage 1 through stage 4 chronic kidney disease, or unspecified chronic kidney disease: Secondary | ICD-10-CM | POA: Diagnosis present

## 2014-07-11 DIAGNOSIS — Z7901 Long term (current) use of anticoagulants: Secondary | ICD-10-CM

## 2014-07-11 DIAGNOSIS — M1990 Unspecified osteoarthritis, unspecified site: Secondary | ICD-10-CM | POA: Diagnosis present

## 2014-07-11 DIAGNOSIS — M7989 Other specified soft tissue disorders: Secondary | ICD-10-CM | POA: Diagnosis not present

## 2014-07-11 LAB — CBC WITH DIFFERENTIAL/PLATELET
Basophils Absolute: 0 10*3/uL (ref 0.0–0.1)
Basophils Relative: 0 % (ref 0–1)
Eosinophils Absolute: 0.3 10*3/uL (ref 0.0–0.7)
Eosinophils Relative: 6 % — ABNORMAL HIGH (ref 0–5)
HCT: 41.2 % (ref 39.0–52.0)
HEMOGLOBIN: 13.2 g/dL (ref 13.0–17.0)
LYMPHS ABS: 1.9 10*3/uL (ref 0.7–4.0)
Lymphocytes Relative: 33 % (ref 12–46)
MCH: 30.7 pg (ref 26.0–34.0)
MCHC: 32 g/dL (ref 30.0–36.0)
MCV: 95.8 fL (ref 78.0–100.0)
MONO ABS: 0.7 10*3/uL (ref 0.1–1.0)
Monocytes Relative: 11 % (ref 3–12)
NEUTROS ABS: 2.9 10*3/uL (ref 1.7–7.7)
NEUTROS PCT: 50 % (ref 43–77)
Platelets: 182 10*3/uL (ref 150–400)
RBC: 4.3 MIL/uL (ref 4.22–5.81)
RDW: 14.9 % (ref 11.5–15.5)
WBC: 5.9 10*3/uL (ref 4.0–10.5)

## 2014-07-11 LAB — COMPREHENSIVE METABOLIC PANEL
ALBUMIN: 3.1 g/dL — AB (ref 3.5–5.0)
ALT: 13 U/L — AB (ref 17–63)
ANION GAP: 10 (ref 5–15)
AST: 17 U/L (ref 15–41)
Alkaline Phosphatase: 64 U/L (ref 38–126)
BILIRUBIN TOTAL: 0.6 mg/dL (ref 0.3–1.2)
BUN: 30 mg/dL — ABNORMAL HIGH (ref 6–20)
CALCIUM: 10.4 mg/dL — AB (ref 8.9–10.3)
CO2: 29 mmol/L (ref 22–32)
Chloride: 103 mmol/L (ref 101–111)
Creatinine, Ser: 1.24 mg/dL (ref 0.61–1.24)
GFR calc non Af Amer: 50 mL/min — ABNORMAL LOW (ref >60)
GFR, EST AFRICAN AMERICAN: 58 mL/min — AB (ref >60)
Glucose, Bld: 118 mg/dL — ABNORMAL HIGH (ref 65–99)
Potassium: 4.2 mmol/L (ref 3.5–5.1)
Sodium: 142 mmol/L (ref 135–145)
Total Protein: 6.9 g/dL (ref 6.5–8.1)

## 2014-07-11 LAB — ABO/RH: ABO/RH(D): O POS

## 2014-07-11 LAB — TYPE AND SCREEN
ABO/RH(D): O POS
ANTIBODY SCREEN: NEGATIVE

## 2014-07-11 LAB — TROPONIN I: Troponin I: <0.03 ng/mL (ref <0.031)

## 2014-07-11 LAB — PROTIME-INR
INR: 1.6 — AB (ref 0.00–1.49)
PROTHROMBIN TIME: 19 s — AB (ref 11.6–15.2)

## 2014-07-11 MED ORDER — LABETALOL HCL 5 MG/ML IV SOLN
10.0000 mg | Freq: Once | INTRAVENOUS | Status: AC
  Administered 2014-07-11: 10 mg via INTRAVENOUS
  Filled 2014-07-11: qty 4

## 2014-07-11 MED ORDER — IOHEXOL 350 MG/ML SOLN
100.0000 mL | Freq: Once | INTRAVENOUS | Status: AC | PRN
  Administered 2014-07-11: 100 mL via INTRAVENOUS

## 2014-07-11 NOTE — ED Notes (Signed)
Patient transported to CT via transporter

## 2014-07-11 NOTE — ED Provider Notes (Signed)
CSN: [id number]     Arrival date & time 07/11/14  1956 History   First MD Initiated Contact with Patient 07/11/14 2002     Chief Complaint  Patient presents with  . Hemoptysis     (Consider location/radiation/quality/duration/timing/severity/associated sxs/prior Treatment) HPI The patient reports a coughing episode that resulted in blood coming out. His wife estimates approximately a half a cup of blood was coughed up which was bright red mixed with clot. The patient denies he's been experiencing chest pain. He is on Coumadin. He denies significant shortness of breath different from baseline. He has not had specific calf or lower extremity pain. He does have chronic peripheral edema and wears compression socks and routinely elevates, there has not been any noted change in the degree of swelling. The patient has not had fever or productive cough leading up to this episode. The patient does have a history of metastatic prostate cancer with bone metastases. Past Medical History  Diagnosis Date  . Hyperlipidemia   . Hypokalemia   . Hypertensive heart disease   . Chronic back pain   . History of epistaxis 07/19/2002  . Personal history of long-term (current) use of anticoagulants   . ICH (intracerebral hemorrhage) ~ 1999    after TPA/notes 09/27/2012  . Hypertension   . Atrial fibrillation   . PAT (paroxysmal atrial tachycardia)   . Pneumonia     "once; several years ago" (09/27/2012)  . GERD (gastroesophageal reflux disease)   . H/O hiatal hernia   . Migraines     "migraines without headaches years ago" (09/27/2012)  . Stroke ~ 1999    ischemic / right cerebellar/posterior inferior cerebellar artery / right pos infarct  . Melanoma     "top of my head" (09/27/2012)  . Prostate cancer, primary, with metastasis from prostate to other site     on Lupron injections per GU  . Osteoarthritis of both feet   . S/P radiation therapy 02/13/14-02/27/14    SRS 5/5 spine completed 02/27/14  . S/P radiation  therapy 05/27/14-05/31/14    right femur 20Gy/22fx   Past Surgical History  Procedure Laterality Date  . Nasal sinus surgery  1998  . Cataract extraction w/ intraocular lens  implant, bilateral Bilateral ~ 2011  . Skin graft Right 2010  . Melanoma excision  2010    "pre-melanoma on top of head; did skin graft from left thigh to cover" (09/27/2012)  . Tonsillectomy  ~ 1935   Family History  Problem Relation Age of Onset  . Heart failure Mother   . Heart attack Father    History  Substance Use Topics  . Smoking status: Former Smoker -- .5 years    Types: Cigarettes    Quit date: 02/22/1953  . Smokeless tobacco: Never Used  . Alcohol Use: Yes     Comment: 09/27/2012 "glass of wine or 2/month"    Review of Systems  10 Systems reviewed and are negative for acute change except as noted in the HPI.   Allergies  Pravachol and Zocor  Home Medications   Prior to Admission medications   Medication Sig Start Date End Date Taking? Authorizing Provider  acetaminophen (TYLENOL) 500 MG tablet Take 500 mg by mouth See admin instructions. Takes two tablets in the morning and two tablets at night   Yes Historical Provider, MD  amLODipine (NORVASC) 5 MG tablet Take 1 tablet (5 mg total) by mouth daily. 02/12/14 02/12/15 Yes Ripudeep Krystal Eaton, MD  atorvastatin (LIPITOR) 10 MG tablet  Take 1 tablet (10 mg total) by mouth daily. 09/12/13  Yes [name], MD  B Complex-C (B-COMPLEX WITH VITAMIN C) tablet Take 1 tablet by mouth daily.     Yes Historical Provider, MD  Denosumab (XGEVA [place]) Inject into the skin every 30 (thirty) days. Monthly. Last inj was in April 2016. Wife not sure what dose it is   Yes Historical Provider, MD  Docusate Calcium (STOOL SOFTENER PO) Take 1 capsule by mouth 2 (two) times daily.   Yes Historical Provider, MD  enzalutamide [name]) 40 MG capsule Take 40 mg by mouth daily.   Yes Historical Provider, MD  furosemide (LASIX) 40 MG tablet Take 1 tablet (40 mg total) by mouth  daily. 03/01/14  Yes [name], MD  Leuprolide Acetate (LUPRON DEPOT IM) Inject 1 each into the muscle every 6 (six) months. Last inj was in March 2016   Yes Historical Provider, MD  losartan (COZAAR) 100 MG tablet Take 1 tablet (100 mg total) by mouth daily. 05/06/14  Yes [name], MD  multivitamin [facility]) per tablet Take 1 tablet by mouth daily. ( with B - Complex )   Yes Historical Provider, MD  Omega-3 Fatty Acids (FISH OIL PO) Take 1 capsule by mouth daily. ( Mega Red )   Yes Historical Provider, MD  tamsulosin (FLOMAX) 0.4 MG CAPS capsule Take 1 capsule (0.4 mg total) by mouth daily. 04/23/14  Yes Oswald Hillock, MD  warfarin (COUMADIN) 5 MG tablet Take 1 tablet (5 mg total) by mouth as directed. Patient taking differently: Take 2.5-5 mg by mouth daily. 2.5mg  every day except on wednesdays take 5mg  per wife 01/07/14  Yes [name], MD  ZETIA 10 MG tablet TAKE 1 TABLET BY MOUTH ONCE DAILY 02/01/14  Yes [name], MD   BP 163/75 mmHg  Pulse 82  Temp(Src) 99.6 F (37.6 C) (Oral)  Resp 19  Ht 6' (1.829 m)  Wt 181 lb 9.6 oz (82.373 kg)  BMI 24.62 kg/m2  SpO2 100% Physical Exam  Constitutional: He is oriented to person, place, and time. He appears well-developed and well-nourished.  The patient is alert and nontoxic. He does not have respiratory distress at rest. His mental status is clear.  HENT:  Head: Normocephalic and atraumatic.  Eyes: EOM are normal. Pupils are equal, round, and reactive to light.  Neck: Neck supple.  Cardiovascular: Normal rate, regular rhythm, normal heart sounds and intact distal pulses.   Pulmonary/Chest: Effort normal and breath sounds normal.  Abdominal: Soft. Bowel sounds are normal. He exhibits no distension. There is no tenderness.  Musculoskeletal: Normal range of motion. He exhibits edema. He exhibits no tenderness.  Neurological: He is alert and oriented to person, place, and time. He has normal strength. He exhibits normal  muscle tone. Coordination normal. GCS eye subscore is 4. GCS verbal subscore is 5. GCS motor subscore is 6.  Skin: Skin is warm, dry and intact.  Psychiatric: He has a normal mood and affect.    ED Course  Procedures (including critical care time) Labs Review Labs Reviewed  COMPREHENSIVE METABOLIC PANEL - Abnormal; Notable for the following:    Glucose, Bld 118 (*)    BUN 30 (*)    Calcium 10.4 (*)    Albumin 3.1 (*)    ALT 13 (*)    GFR calc non Af Amer 50 (*)    GFR calc Af Amer 58 (*)    All other components within normal limits  CBC WITH DIFFERENTIAL/PLATELET -  Abnormal; Notable for the following:    Eosinophils Relative 6 (*)    All other components within normal limits  PROTIME-INR - Abnormal; Notable for the following:    Prothrombin Time 19.0 (*)    INR 1.60 (*)    All other components within normal limits  CBC WITH DIFFERENTIAL/PLATELET - Abnormal; Notable for the following:    RBC 3.86 (*)    Hemoglobin 12.1 (*)    HCT 37.0 (*)    All other components within normal limits  COMPREHENSIVE METABOLIC PANEL - Abnormal; Notable for the following:    BUN 30 (*)    Total Protein 6.4 (*)    Albumin 2.9 (*)    ALT 12 (*)    GFR calc non Af Amer 50 (*)    GFR calc Af Amer 58 (*)    All other components within normal limits  HEPARIN LEVEL (UNFRACTIONATED) - Abnormal; Notable for the following:    Heparin Unfractionated 0.18 (*)    All other components within normal limits  CBC - Abnormal; Notable for the following:    RBC 3.81 (*)    Hemoglobin 11.9 (*)    HCT 36.6 (*)    All other components within normal limits  HEPARIN LEVEL (UNFRACTIONATED) - Abnormal; Notable for the following:    Heparin Unfractionated 0.22 (*)    All other components within normal limits  CBC - Abnormal; Notable for the following:    RBC 3.59 (*)    Hemoglobin 11.5 (*)    HCT 34.5 (*)    All other components within normal limits  CBC - Abnormal; Notable for the following:    RBC 3.43 (*)     Hemoglobin 10.8 (*)    HCT 32.9 (*)    All other components within normal limits  CULTURE, BLOOD (ROUTINE X 2)  MRSA PCR SCREENING  TROPONIN I  HEPARIN LEVEL (UNFRACTIONATED)  CBC  TYPE AND SCREEN  ABO/RH    Imaging Review No results found.   EKG Interpretation None     Consultation: Patient's case was reviewed with the intensivist regarding heparinization for PE. At this time was determined heparin would be appropriate. The patient is noted to the hospitalist service for ongoing treatment.  CRITICAL CARE Performed by: Charlesetta Shanks   Total critical care time: 30  Critical care time was exclusive of separately billable procedures and treating other patients.  Critical care was necessary to treat or prevent imminent or life-threatening deterioration.  Critical care was time spent personally by me on the following activities: development of treatment plan with patient and/or surrogate as well as nursing, discussions with consultants, evaluation of patient's response to treatment, examination of patient, obtaining history from patient or surrogate, ordering and performing treatments and interventions, ordering and review of laboratory studies, ordering and review of radiographic studies, pulse oximetry and re-evaluation of patient's condition. MDM   Final diagnoses:  Pulmonary embolism  Cough with hemoptysis   The patient presents with frank hemoptysis and known history of metastatic prostate cancer. CT PE study has identified possibly chronic PE. After review of the patient's case with the intensivist, determination has been made to initiate a coagulation with heparin. The patient is subtherapeutic on Coumadin.    Charlesetta Shanks, MD 07/14/14 2111

## 2014-07-11 NOTE — ED Notes (Signed)
Pt presents via ems for "coughing spell" in which he began to cough up thick blood and clots; pt states he takes coumadin; pt reports coughing has since stopped and ems staff reports no coughing while enroute; pt CAOx 4 at this time; pt o2Sats stable with oxygen via [place]

## 2014-07-11 NOTE — ED Notes (Signed)
Pfeiffer, MD at bedside.  

## 2014-07-12 ENCOUNTER — Encounter (HOSPITAL_COMMUNITY): Payer: Self-pay | Admitting: Internal Medicine

## 2014-07-12 ENCOUNTER — Inpatient Hospital Stay (HOSPITAL_COMMUNITY): Payer: Medicare Other

## 2014-07-12 DIAGNOSIS — R042 Hemoptysis: Secondary | ICD-10-CM

## 2014-07-12 DIAGNOSIS — I2699 Other pulmonary embolism without acute cor pulmonale: Principal | ICD-10-CM

## 2014-07-12 DIAGNOSIS — I160 Hypertensive urgency: Secondary | ICD-10-CM | POA: Diagnosis present

## 2014-07-12 DIAGNOSIS — M7989 Other specified soft tissue disorders: Secondary | ICD-10-CM

## 2014-07-12 DIAGNOSIS — Z7901 Long term (current) use of anticoagulants: Secondary | ICD-10-CM

## 2014-07-12 LAB — CBC
HEMATOCRIT: 36.6 % — AB (ref 39.0–52.0)
HEMOGLOBIN: 11.9 g/dL — AB (ref 13.0–17.0)
MCH: 31.2 pg (ref 26.0–34.0)
MCHC: 32.5 g/dL (ref 30.0–36.0)
MCV: 96.1 fL (ref 78.0–100.0)
Platelets: 165 10*3/uL (ref 150–400)
RBC: 3.81 MIL/uL — ABNORMAL LOW (ref 4.22–5.81)
RDW: 15.2 % (ref 11.5–15.5)
WBC: 7 10*3/uL (ref 4.0–10.5)

## 2014-07-12 LAB — COMPREHENSIVE METABOLIC PANEL
ALT: 12 U/L — ABNORMAL LOW (ref 17–63)
ANION GAP: 7 (ref 5–15)
AST: 15 U/L (ref 15–41)
Albumin: 2.9 g/dL — ABNORMAL LOW (ref 3.5–5.0)
Alkaline Phosphatase: 56 U/L (ref 38–126)
BILIRUBIN TOTAL: 0.7 mg/dL (ref 0.3–1.2)
BUN: 30 mg/dL — AB (ref 6–20)
CHLORIDE: 105 mmol/L (ref 101–111)
CO2: 27 mmol/L (ref 22–32)
CREATININE: 1.24 mg/dL (ref 0.61–1.24)
Calcium: 9.9 mg/dL (ref 8.9–10.3)
GFR calc Af Amer: 58 mL/min — ABNORMAL LOW (ref >60)
GFR, EST NON AFRICAN AMERICAN: 50 mL/min — AB (ref >60)
Glucose, Bld: 96 mg/dL (ref 65–99)
Potassium: 4 mmol/L (ref 3.5–5.1)
Sodium: 139 mmol/L (ref 135–145)
Total Protein: 6.4 g/dL — ABNORMAL LOW (ref 6.5–8.1)

## 2014-07-12 LAB — CBC WITH DIFFERENTIAL/PLATELET
BASOS ABS: 0 10*3/uL (ref 0.0–0.1)
Basophils Relative: 1 % (ref 0–1)
Eosinophils Absolute: 0.3 10*3/uL (ref 0.0–0.7)
Eosinophils Relative: 4 % (ref 0–5)
HCT: 37 % — ABNORMAL LOW (ref 39.0–52.0)
HEMOGLOBIN: 12.1 g/dL — AB (ref 13.0–17.0)
LYMPHS ABS: 1.5 10*3/uL (ref 0.7–4.0)
LYMPHS PCT: 23 % (ref 12–46)
MCH: 31.3 pg (ref 26.0–34.0)
MCHC: 32.7 g/dL (ref 30.0–36.0)
MCV: 95.9 fL (ref 78.0–100.0)
Monocytes Absolute: 0.7 10*3/uL (ref 0.1–1.0)
Monocytes Relative: 11 % (ref 3–12)
NEUTROS ABS: 4.1 10*3/uL (ref 1.7–7.7)
Neutrophils Relative %: 61 % (ref 43–77)
PLATELETS: 168 10*3/uL (ref 150–400)
RBC: 3.86 MIL/uL — AB (ref 4.22–5.81)
RDW: 14.9 % (ref 11.5–15.5)
WBC: 6.6 10*3/uL (ref 4.0–10.5)

## 2014-07-12 LAB — MRSA PCR SCREENING: MRSA BY PCR: NEGATIVE

## 2014-07-12 LAB — HEPARIN LEVEL (UNFRACTIONATED)
HEPARIN UNFRACTIONATED: 0.18 [IU]/mL — AB (ref 0.30–0.70)
Heparin Unfractionated: 0.48 IU/mL (ref 0.30–0.70)

## 2014-07-12 MED ORDER — ADULT MULTIVITAMIN W/MINERALS CH
1.0000 | ORAL_TABLET | Freq: Every day | ORAL | Status: DC
Start: 2014-07-12 — End: 2014-07-19
  Administered 2014-07-12 – 2014-07-19 (×8): 1 via ORAL
  Filled 2014-07-12 (×12): qty 1

## 2014-07-12 MED ORDER — DOCUSATE SODIUM 100 MG PO CAPS
100.0000 mg | ORAL_CAPSULE | Freq: Two times a day (BID) | ORAL | Status: DC
  Administered 2014-07-12 – 2014-07-19 (×15): 100 mg via ORAL
  Filled 2014-07-12 (×16): qty 1

## 2014-07-12 MED ORDER — ACETAMINOPHEN 325 MG PO TABS
650.0000 mg | ORAL_TABLET | Freq: Four times a day (QID) | ORAL | Status: DC | PRN
  Administered 2014-07-16 – 2014-07-17 (×3): 650 mg via ORAL
  Filled 2014-07-12 (×4): qty 2

## 2014-07-12 MED ORDER — OMEGA-3-ACID ETHYL ESTERS 1 G PO CAPS
1.0000 g | ORAL_CAPSULE | Freq: Two times a day (BID) | ORAL | Status: DC
  Administered 2014-07-12 – 2014-07-19 (×15): 1 g via ORAL
  Filled 2014-07-12 (×17): qty 1

## 2014-07-12 MED ORDER — HEPARIN (PORCINE) IN NACL 100-0.45 UNIT/ML-% IJ SOLN
1100.0000 [IU]/h | INTRAMUSCULAR | Status: DC
  Administered 2014-07-12: 1000 [IU]/h via INTRAVENOUS
  Administered 2014-07-13: 1100 [IU]/h via INTRAVENOUS
  Filled 2014-07-12 (×2): qty 250

## 2014-07-12 MED ORDER — HYDRALAZINE HCL 20 MG/ML IJ SOLN
10.0000 mg | INTRAMUSCULAR | Status: DC | PRN
  Administered 2014-07-12: 10 mg via INTRAVENOUS
  Filled 2014-07-12: qty 1

## 2014-07-12 MED ORDER — LOSARTAN POTASSIUM 50 MG PO TABS
100.0000 mg | ORAL_TABLET | Freq: Every day | ORAL | Status: DC
  Administered 2014-07-12 – 2014-07-17 (×6): 100 mg via ORAL
  Filled 2014-07-12 (×7): qty 2

## 2014-07-12 MED ORDER — ENZALUTAMIDE 40 MG PO CAPS
40.0000 mg | ORAL_CAPSULE | Freq: Every day | ORAL | Status: DC
  Administered 2014-07-12 – 2014-07-19 (×8): 40 mg via ORAL
  Filled 2014-07-12 (×4): qty 1

## 2014-07-12 MED ORDER — AMLODIPINE BESYLATE 5 MG PO TABS
5.0000 mg | ORAL_TABLET | Freq: Every day | ORAL | Status: DC
  Administered 2014-07-12 – 2014-07-19 (×8): 5 mg via ORAL
  Filled 2014-07-12 (×8): qty 1

## 2014-07-12 MED ORDER — ACETAMINOPHEN 650 MG RE SUPP: 650.0000 mg | Freq: Four times a day (QID) | RECTAL | Status: DC | PRN

## 2014-07-12 MED ORDER — ONDANSETRON HCL 4 MG/2ML IJ SOLN
4.0000 mg | Freq: Four times a day (QID) | INTRAMUSCULAR | Status: DC | PRN
  Administered 2014-07-15: 4 mg via INTRAVENOUS
  Filled 2014-07-12: qty 2

## 2014-07-12 MED ORDER — SODIUM CHLORIDE 0.9 % IJ SOLN
3.0000 mL | Freq: Two times a day (BID) | INTRAMUSCULAR | Status: DC
  Administered 2014-07-12 – 2014-07-18 (×9): 3 mL via INTRAVENOUS

## 2014-07-12 MED ORDER — FUROSEMIDE 40 MG PO TABS
40.0000 mg | ORAL_TABLET | Freq: Every day | ORAL | Status: DC
  Administered 2014-07-12 – 2014-07-16 (×5): 40 mg via ORAL
  Filled 2014-07-12 (×5): qty 1

## 2014-07-12 MED ORDER — ATORVASTATIN CALCIUM 10 MG PO TABS
10.0000 mg | ORAL_TABLET | Freq: Every day | ORAL | Status: DC
  Administered 2014-07-12 – 2014-07-19 (×8): 10 mg via ORAL
  Filled 2014-07-12 (×8): qty 1

## 2014-07-12 MED ORDER — EZETIMIBE 10 MG PO TABS
10.0000 mg | ORAL_TABLET | Freq: Every day | ORAL | Status: DC
  Administered 2014-07-12 – 2014-07-19 (×8): 10 mg via ORAL
  Filled 2014-07-12 (×8): qty 1

## 2014-07-12 MED ORDER — ONDANSETRON HCL 4 MG PO TABS: 4.0000 mg | ORAL_TABLET | Freq: Four times a day (QID) | ORAL | Status: DC | PRN

## 2014-07-12 MED ORDER — TAMSULOSIN HCL 0.4 MG PO CAPS
0.4000 mg | ORAL_CAPSULE | Freq: Every day | ORAL | Status: DC
  Administered 2014-07-12 – 2014-07-19 (×8): 0.4 mg via ORAL
  Filled 2014-07-12 (×8): qty 1

## 2014-07-12 NOTE — Plan of Care (Signed)
Problem: Consults Goal: Diagnosis - Venous Thromboembolism (VTE) Choose a selection Outcome: Completed/Met Date Met:  07/12/14 PE (Pulmonary Embolism)

## 2014-07-12 NOTE — ED Notes (Signed)
Admitting MD at bedside.

## 2014-07-12 NOTE — ED Notes (Signed)
Made pt and family aware of bed assignment

## 2014-07-12 NOTE — ED Notes (Signed)
Gerald Stabs, charge nurse/Sara, Nurse informed appt time @0025 .

## 2014-07-12 NOTE — Progress Notes (Addendum)
[facility] hospitalist  81 year old male admitted early this morning with past medical history of metastatic prostate cancer atrial fibrillation on Coumadin, chronic diastolic heart failure, hypertension who presents to the hospital for hemoptysis 1 episode. CT scan showed a chronic pulmonary embolism. He was admitted for further management and a pulmonary consult was requested.  I have reviewed the H&P and pulmonary consult note. I evaluated the patient this morning who stated that he had not noted any further hemoptysis.   Principal Problem:   Hemoptysis- chronic pulmonary embolism - follow for recurrence- currently on heparin infusion as recommended by pulmonary - if hemoptysis recurs, will need to discontinue heparin and possibly perform bronchus  Active Problems:   Chronic atrial fibrillation -Continue heparin infusion- INR subtherapeutic- holding Coumadin in setting of hemoptysis --rate controlled without any rate controlling agents    Prostate cancer metastatic to bone -Status post combined androgen deprivation with Lupron and Casodex and stereotactic radiotherapy to bone metastasis    Chronic kidney disease (CKD), stage III (moderate) -Stable    Hypercalcemia -Mildly elevated calcium likely related to to prostate cancer with metastasis to the bone  Debbe Odea, MD Pager: [email or web address]

## 2014-07-12 NOTE — ED Notes (Signed)
Family at bedside. 

## 2014-07-12 NOTE — Consult Note (Signed)
Name: Craig [name]. 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  Patient reports he had a bronchitis episode and was coughing for 1 wk then on the day of presentation to the hospital had an episode where he coughed up a cup of blood.  That resolved spontaneously and has not occurred since.  Patient is on coumadin for a-fib.  Now on heparin drip.  CTA showed a "chronic" PE.  Also of note, prostate cancer with mets to the spine on lupron.  SIGNIFICANT EVENTS  5/19 hemoptysis.  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  Patient reports he had a bronchitis episode and was coughing for 1 wk then on the day of presentation to the hospital had an episode where he coughed up a cup of blood.  That resolved spontaneously and has not occurred since.  Patient is on coumadin for a-fib.  Now on heparin drip.  CTA showed a "chronic" PE.  Also of note, prostate cancer with mets to the spine on lupron.  PAST MEDICAL HISTORY :   has a past medical history of Hyperlipidemia; Hypokalemia; Hypertensive heart disease; Chronic back pain; History of epistaxis (07/19/2002); Personal history of long-term (current) use of anticoagulants; ICH (intracerebral hemorrhage) (~ 1999); Hypertension; Atrial fibrillation; PAT (paroxysmal atrial tachycardia); Pneumonia; GERD (gastroesophageal reflux disease); H/O hiatal hernia; Migraines; Stroke (~ 1999); Melanoma; Prostate cancer, primary, with metastasis from prostate to other site; Osteoarthritis of both feet; S/P radiation therapy (02/13/14-02/27/14); and S/P  radiation therapy (05/27/14-05/31/14).  has past surgical history that includes Nasal sinus surgery (1998); Cataract extraction w/ intraocular lens  implant, bilateral (Bilateral, ~ 2011); Skin graft (Right, 2010); Melanoma excision (2010); and Tonsillectomy (~ 1935). Prior to Admission medications   Medication Sig Start Date End Date Taking? Authorizing Provider  acetaminophen (TYLENOL) 500 MG tablet Take 500 mg by mouth See admin instructions. Takes two tablets in the morning and two tablets at night   Yes Historical Provider, MD  amLODipine (NORVASC) 5 MG tablet Take 1 tablet (5 mg total) by mouth daily. 02/12/14 02/12/15 Yes Ripudeep Krystal Eaton, MD  atorvastatin (LIPITOR) 10 MG tablet Take 1 tablet (10 mg total) by mouth daily. 09/12/13  Yes [name], MD  B Complex-C (B-COMPLEX WITH VITAMIN C) tablet Take 1 tablet by mouth daily.     Yes Historical Provider, MD  Denosumab (XGEVA [place]) Inject into the skin every 30 (thirty) days. Monthly. Last inj was in April 2016. Wife not sure what dose it is   Yes Historical Provider, MD  Docusate Calcium (STOOL SOFTENER PO) Take 1 capsule by mouth 2 (two) times daily.   Yes Historical Provider, MD  enzalutamide [name]) 40 MG capsule Take 40 mg by mouth daily.   Yes Historical Provider, MD  furosemide (LASIX) 40 MG tablet Take 1 tablet (40 mg total) by mouth daily. 03/01/14  Yes [name], MD  Leuprolide Acetate (LUPRON DEPOT IM) Inject 1 each into the muscle every 6 (six) months. Last inj was in March 2016   Yes Historical Provider, MD  losartan (COZAAR) 100 MG tablet Take  1 tablet (100 mg total) by mouth daily. 05/06/14  Yes [name], MD  multivitamin [facility]) per tablet Take 1 tablet by mouth daily. ( with B - Complex )   Yes Historical Provider, MD  Omega-3 Fatty Acids (FISH OIL PO) Take 1 capsule by mouth daily. ( Mega Red )   Yes Historical Provider, MD  tamsulosin (FLOMAX) 0.4 MG CAPS capsule Take 1 capsule (0.4 mg total) by mouth daily.  04/23/14  Yes Oswald Hillock, MD  warfarin (COUMADIN) 5 MG tablet Take 1 tablet (5 mg total) by mouth as directed. Patient taking differently: Take 2.5-5 mg by mouth daily. 2.5mg  every day except on wednesdays take 5mg  per wife 01/07/14  Yes [name], MD  ZETIA 10 MG tablet TAKE 1 TABLET BY MOUTH ONCE DAILY 02/01/14  Yes [name], MD   Allergies  Allergen Reactions  . Pravachol     Muscle weakness  . Zocor [Simvastatin - High Dose]     Muscle weakness   FAMILY HISTORY:  family history includes Heart attack in his father; Heart failure in his mother. SOCIAL HISTORY:  reports that he quit smoking about 61 years ago. His smoking use included Cigarettes. He quit after .5 years of use. He has never used smokeless tobacco. He reports that he drinks alcohol. He reports that he does not use illicit drugs.  REVIEW OF SYSTEMS:   Constitutional: Negative for fever, chills, weight loss, malaise/fatigue and diaphoresis.  HENT: Negative for hearing loss, ear pain, nosebleeds, congestion, sore throat, neck pain, tinnitus and ear discharge.   Eyes: Negative for blurred vision, double vision, photophobia, pain, discharge and redness.  Respiratory: Positive for cough, hemoptysis, negative for sputum production, shortness of breath, wheezing and stridor.   Cardiovascular: Negative for chest pain, palpitations, orthopnea, claudication, leg swelling and PND.  Gastrointestinal: Negative for heartburn, nausea, vomiting, abdominal pain, diarrhea, constipation, blood in stool and melena.  Genitourinary: Negative for dysuria, urgency, frequency, hematuria and flank pain.  Musculoskeletal: Negative for myalgias, back pain, joint pain and falls.  Skin: Negative for itching and rash.  Neurological: Negative for dizziness, tingling, tremors, sensory change, speech change, focal weakness, seizures, loss of consciousness, weakness and headaches.  Endo/Heme/Allergies: Negative for environmental allergies and  polydipsia. Does not bruise/bleed easily.  SUBJECTIVE: denies symptoms right now  VITAL SIGNS: Temp:  [97.6 F (36.4 C)-98.1 F (36.7 C)] 98.1 F (36.7 C) (05/20 0521) Pulse Rate:  [63-96] 76 (05/20 1128) Resp:  [13-20] 14 (05/20 1128) BP: (136-215)/(58-113) 158/60 mmHg (05/20 1128) SpO2:  [96 %-100 %] 100 % (05/20 1128) Weight:  [83.8 kg (184 lb 11.9 oz)] 83.8 kg (184 lb 11.9 oz) (05/20 0100)  PHYSICAL EXAMINATION: General:  Elderly appearing male, NAD. Neuro:  Alert and interactive, following all commands. Head: [place]/AT EENT:  PERRL, EOM-I and MMM Cardiovascular:  RRR, Nl S1/S2, -M/R/G. Lungs:  CTA bilaterally. Abdomen:  Soft, NT, ND and +BS. Musculoskeletal:  -edema and -tenderness. Skin:  Intact.  Recent Labs Lab 07/11/14 2027 07/12/14 0312  NA 142 139  K 4.2 4.0  CL 103 105  CO2 29 27  BUN 30* 30*  CREATININE 1.24 1.24  GLUCOSE 118* 96   Recent Labs Lab 07/11/14 2027 07/12/14 0312 07/12/14 1119  HGB 13.2 12.1* 11.9*  HCT 41.2 37.0* 36.6*  WBC 5.9 6.6 7.0  PLT 182 168 165   Ct Angio Chest Pe W/cm &/or Wo Cm  07/11/2014   CLINICAL DATA:  Hemoptysis today. History of chronic anticoagulant use, prostate  cancer, melanoma, pneumonia, hypertension.  EXAM: CT ANGIOGRAPHY CHEST WITH CONTRAST  TECHNIQUE: Multidetector CT imaging of the chest was performed using the standard protocol during bolus administration of intravenous contrast. Multiplanar CT image reconstructions and MIPs were obtained to evaluate the vascular anatomy.  CONTRAST:  185mL OMNIPAQUE IOHEXOL 350 MG/ML SOLN  COMPARISON:  Chest radiograph April 18, 2014 and MRI of the thoracic spine February 09, 2014  FINDINGS: PULMONARY ARTERY: Adequate contrast opacification of the pulmonary artery's. Main pulmonary artery is enlarged, 4.1 cm in transaxial dimension. Linear filling defect in the LEFT lower lobe bronchus, adherent to the anterior wall of the pulmonary artery, propagating into LEFT lower lobe segmental  branch with occlusion.  MEDIASTINUM: The heart is moderately enlarged, no right heart strain. Calcific atherosclerosis of the aortic arch with small eccentric undersurface chronic dissection with peripheral calcification. 16 mm short axis aortopulmonary window lymph node, 14 mm short axis pretracheal lymph node. Calcified LEFT hilar lymph nodes.  LUNGS: Tracheobronchial tree is patent, no pneumothorax. Small RIGHT greater than LEFT layering pleural effusions. Mild bronchial wall thickening diffusely. No focal consolidation. Atelectasis in the lower lobes, RIGHT middle lobe and lingula. LEFT lower lobe calcified granuloma.  SOFT TISSUES AND OSSEOUS STRUCTURES: Included view of the abdomen demonstrates calcified splenic granulomas, no acute process. Status post T11 vertebral bodies cement augmentation, similar T6 moderate compression fracture. New linear sclerosis through T9 vertebral body concerning for is early insufficiency fracture without height loss.  Review of the MIP images confirms the above findings.  IMPRESSION: Chronic appearing LEFT lower lobe pulmonary embolus, occlusive within the segmental branch, without convincing evidence of acute pulmonary embolus. Moderate cardiomegaly without RIGHT heart strain. Enlarged main pulmonary artery most consistent with chronic pulmonary arterial hypertension.  Small bilateral pleural effusions. Bronchial wall thickening most consistent bronchitis.  New linear sclerosis in vertebral body T9 concerning for early insufficiency fracture without height loss.   Electronically Signed   By: Elon Alas   On: 07/11/2014 22:59   ASSESSMENT / PLAN:  81 year old with a single episode of hemoptysis, was coughing from bronchitis and on anti-coagulant, that is the most likely reason for hemoptysis.  This was a single episode with a PE present as well.  I reviewed the chest CT myself, small PE noted, no significant infarct noted.  Case discussed with patient and  family.  Hemoptysis: As above.  - No need for bronch for now.  - Continue anticoagulation via heparin.  - Monitor closely.  Hypoxemia: due to PE.  - Supplemental O2.  - Titrate O2 down as able.  - Anticoagulation as ordered  - R/O DVT.  Anticoagulation: due to a-fib and now PE.  - F/U on DVT study.  - No IVC filter unless mobile clot.  PCCM will follow, please call back if hemoptysis recurs before our follow up.  Rush Farmer, M.D. [facility] Pulmonary/Critical Care Medicine. Pager: [phone]. After hours pager: [phone].  07/12/2014, 1:36 PM

## 2014-07-12 NOTE — Progress Notes (Addendum)
ANTICOAGULATION CONSULT NOTE - Follow Up Consult  Pharmacy Consult for Heparin Indication: PE and afib  Allergies  Allergen Reactions  . Pravachol     Muscle weakness  . Zocor [Simvastatin - High Dose]     Muscle weakness    Patient Measurements: Height: 6' (182.9 cm) Weight: 184 lb 11.9 oz (83.8 kg) IBW/kg (Calculated) : 77.6 Heparin Dosing Weight: 83.8 kg  Vital Signs: Temp: 98.1 F (36.7 C) (05/20 0521) Temp Source: Oral (05/20 0521) BP: 158/60 mmHg (05/20 1128) Pulse Rate: 76 (05/20 1128)  Labs:  Recent Labs  07/11/14 2027 07/12/14 0312 07/12/14 1119  HGB 13.2 12.1* 11.9*  HCT 41.2 37.0* 36.6*  PLT 182 168 165  LABPROT 19.0*  --   --   INR 1.60*  --   --   HEPARINUNFRC  --   --  0.18*  CREATININE 1.24 1.24  --   TROPONINI <0.03  --   --     Estimated Creatinine Clearance: 46.1 mL/min (by C-G formula based on Cr of 1.24).   Assessment: 81yo male on Coumadin for afib, c/o cough w/ thick blood and clots, coughing stopped prior to presentation to ED, to begin heparin bridge w/ INR below goal; of note pt has low INR goal 1.8-2.2 for recurrent epistaxis, will give low heparin level goal given hemoptysis and prior bleeding issues. CT shows chronic PE.  Anticoagulation: Chronic PE on CT, afib. HL 0.18. Hgb down to 11.9. Plts 165 ok.  Cardiovascular: dCHF, HTN, afib, PAT. BP 158/60 on Norvasc5, Lipit10, Zetia, po Lasix, losartan, Lovaza  Endocrinology: ok  Gastrointestinal / Nutrition: GERD, h/o HH,   Neurology: chronic back pain, [place],  OA  Nephrology: Scr 1.24  Pulmonary: h/o epistaxis,   Hematology / Oncology: metastatic prostate cancer,    Goal of Therapy:  Heparin level 0.3-0.5 units/ml Monitor platelets by anticoagulation protocol: Yes   Plan:  Increase IV heparin to 1100 units/hr. Recheck level in 6 hrs.   Crystal S. Alford Highland, PharmD, BCPS Clinical Staff Pharmacist Pager [phone]  Eilene Ghazi Stillinger 07/12/2014,1:42  PM    Heparin level therapeutic, continue current rate. Next level with AM labs  Harvel Quale  07/12/2014 9:24 PM

## 2014-07-12 NOTE — Progress Notes (Signed)
Medicare Important Message given? YES   (If response is "NO", the following Medicare IM given date fields will be blank)   Date Medicare IM given:  07-12-14 Medicare IM given by: Jacqlyn Krauss

## 2014-07-12 NOTE — Progress Notes (Signed)
Wife at bedside states pt just finished up with PT in April.  He had a series of falls in December and [facility] PT/OT worked with him all of Jan, Feb, and March.

## 2014-07-12 NOTE — Care Management Note (Signed)
Case Management Note  Patient Details  Name: Craig [name]. MRN: [id number] Date of Birth: 1925-02-05  Subjective/Objective:  Pt admitted for hemoptysis. Pulmonary consulting.                   Action/Plan: CM to continue to monitor for disposition needs. PT to consult for recommendations.   Expected Discharge Date:                  Expected Discharge Plan:  [place]  In-House Referral:     Discharge planning Services  CM Consult  Post Acute Care Choice:    Choice offered to:     DME Arranged:    DME Agency:     HH Arranged:    Hope:     Status of Service:     Medicare Important Message Given:  Yes Date Medicare IM Given:  07/12/14 Medicare IM give by:  Jacqlyn Krauss, RN. BSN Date Additional Medicare IM Given:    Additional Medicare Important Message give by:     If discussed at [place] of Stay Meetings, dates discussed:    Additional Comments:  Bethena Roys, RN 07/12/2014, 4:14 PM

## 2014-07-12 NOTE — Progress Notes (Signed)
ANTICOAGULATION CONSULT NOTE - Initial Consult  Pharmacy Consult for heparin Indication: pulmonary embolus  Allergies  Allergen Reactions  . Pravachol     Muscle weakness  . Zocor [Simvastatin - High Dose]     Muscle weakness    Patient Measurements: Height: 6' (182.9 cm) Weight: 184 lb 11.9 oz (83.8 kg) IBW/kg (Calculated) : 77.6  Vital Signs: Temp: 97.6 F (36.4 C) (05/19 2007) Temp Source: Oral (05/19 2007) BP: 208/89 mmHg (05/20 0006) Pulse Rate: 68 (05/20 0006)  Labs:  Recent Labs  07/11/14 2027  HGB 13.2  HCT 41.2  PLT 182  LABPROT 19.0*  INR 1.60*  CREATININE 1.24  TROPONINI <0.03    Estimated Creatinine Clearance: 46.1 mL/min (by C-G formula based on Cr of 1.24).   Medical History: Past Medical History  Diagnosis Date  . Hyperlipidemia   . Hypokalemia   . Hypertensive heart disease   . Chronic back pain   . History of epistaxis 07/19/2002  . Personal history of long-term (current) use of anticoagulants   . ICH (intracerebral hemorrhage) ~ 1999    after TPA/notes 09/27/2012  . Hypertension   . Atrial fibrillation   . PAT (paroxysmal atrial tachycardia)   . Pneumonia     "once; several years ago" (09/27/2012)  . GERD (gastroesophageal reflux disease)   . H/O hiatal hernia   . Migraines     "migraines without headaches years ago" (09/27/2012)  . Stroke ~ 1999    ischemic / right cerebellar/posterior inferior cerebellar artery / right pos infarct  . Melanoma     "top of my head" (09/27/2012)  . Prostate cancer, primary, with metastasis from prostate to other site     on Lupron injections per GU  . Osteoarthritis of both feet   . S/P radiation therapy 02/13/14-02/27/14    SRS 5/5 spine completed 02/27/14  . S/P radiation therapy 05/27/14-05/31/14    right femur 20Gy/21fx    Medications:  Prescriptions prior to admission  Medication Sig Dispense Refill Last Dose  . acetaminophen (TYLENOL) 500 MG tablet Take 500 mg by mouth See admin instructions. Takes  two tablets in the morning and two tablets at night   07/11/2014 at Unknown time  . amLODipine (NORVASC) 5 MG tablet Take 1 tablet (5 mg total) by mouth daily. 90 tablet 1 07/11/2014 at Unknown time  . atorvastatin (LIPITOR) 10 MG tablet Take 1 tablet (10 mg total) by mouth daily. 90 tablet 11 07/11/2014 at Unknown time  . B Complex-C (B-COMPLEX WITH VITAMIN C) tablet Take 1 tablet by mouth daily.     07/11/2014 at Unknown time  . Denosumab (XGEVA [place]) Inject into the skin every 30 (thirty) days. Monthly. Last inj was in April 2016. Wife not sure what dose it is   Past Month at Unknown time  . Docusate Calcium (STOOL SOFTENER PO) Take 1 capsule by mouth 2 (two) times daily.   07/11/2014 at Unknown time  . enzalutamide (XTANDI) 40 MG capsule Take 40 mg by mouth daily.   07/11/2014 at Unknown time  . furosemide (LASIX) 40 MG tablet Take 1 tablet (40 mg total) by mouth daily. 90 tablet 3 07/11/2014 at Unknown time  . Leuprolide Acetate (LUPRON DEPOT IM) Inject 1 each into the muscle every 6 (six) months. Last inj was in March 2016   Past Month at Unknown time  . losartan (COZAAR) 100 MG tablet Take 1 tablet (100 mg total) by mouth daily. 30 tablet 2 07/11/2014 at Unknown time  .  multivitamin (THERAGRAN) per tablet Take 1 tablet by mouth daily. ( with B - Complex )   07/11/2014 at Unknown time  . Omega-3 Fatty Acids (FISH OIL PO) Take 1 capsule by mouth daily. ( Mega Red )   07/11/2014 at Unknown time  . tamsulosin (FLOMAX) 0.4 MG CAPS capsule Take 1 capsule (0.4 mg total) by mouth daily. 30 capsule 2 07/11/2014 at Unknown time  . warfarin (COUMADIN) 5 MG tablet Take 1 tablet (5 mg total) by mouth as directed. (Patient taking differently: Take 2.5-5 mg by mouth daily. 2.5mg  every day except on wednesdays take 5mg  per wife) 30 tablet 3 07/11/2014 at Unknown time  . ZETIA 10 MG tablet TAKE 1 TABLET BY MOUTH ONCE DAILY 30 tablet 5 07/11/2014 at Unknown time   Scheduled:  . amLODipine  5 mg Oral Daily  . atorvastatin   10 mg Oral Daily  . docusate sodium  100 mg Oral BID  . enzalutamide  40 mg Oral Daily  . ezetimibe  10 mg Oral Daily  . furosemide  40 mg Oral Daily  . losartan  100 mg Oral Daily  . multivitamin with minerals  1 tablet Oral Daily  . omega-3 acid ethyl esters  1 g Oral BID  . sodium chloride  3 mL Intravenous Q12H  . tamsulosin  0.4 mg Oral Daily    Assessment: 81yo male c/o cough w/ thick blood and clots, coughing stopped prior to presentation to ED, to begin heparin bridge w/ INR below goal; of note pt has low INR goal 1.8-2.2 for recurrent epistaxis, will give low heparin level goal given hemoptysis and prior bleeding issues.  Goal of Therapy:  Heparin level 0.3-0.5 units/ml Monitor platelets by anticoagulation protocol: Yes   Plan:  Will begin heparin gtt at 1000 units/hr and monitor heparin levels and CBC.  Wynona Neat, PharmD, BCPS  07/12/2014,1:08 AM

## 2014-07-12 NOTE — H&P (Addendum)
[facility] History and Physical  Craig [name]. QMG:[id number] DOB: Feb 12, 1925 DOA: 07/11/2014  Referring physician: Dr. Vallery Ridge. PCP: [name], MD  Specialists: Dr. Alen Blew.  Chief Complaint: Coughing up blood.  HPI: Craig [name]. is a 81 y.o. male with history of metastatic prostate cancer, chronic diastolic CHF, hypertension, chronic atrial fibrillation on Coumadin was brought to the ER after patient had coughed up blood as noticed by patient's wife. Patient's wife states that he has coughed up 3-4 times at least significant amount of frank blood. Patient did not have any chest pain or shortness of breath. In the ER CT angiogram shows chronic pulmonary embolism. On-call pulmonary critical care Dr.Sommer was consulted by the ER physician and Dr. Oletta Darter has recommended to keep patient on IV heparin and observe. Pulmonary will be seeing patient in consult. Patient is on Coumadin for atrial fibrillation and has had previous episodes of epistaxis and as per the family cardiologist is planning to keep on the lower range of the INR. Patient at this time is not in distress.   Review of Systems: As presented in the history of presenting illness, rest negative.  Past Medical History  Diagnosis Date  . Hyperlipidemia   . Hypokalemia   . Hypertensive heart disease   . Chronic back pain   . History of epistaxis 07/19/2002  . Personal history of long-term (current) use of anticoagulants   . ICH (intracerebral hemorrhage) ~ 1999    after TPA/notes 09/27/2012  . Hypertension   . Atrial fibrillation   . PAT (paroxysmal atrial tachycardia)   . Pneumonia     "once; several years ago" (09/27/2012)  . GERD (gastroesophageal reflux disease)   . H/O hiatal hernia   . Migraines     "migraines without headaches years ago" (09/27/2012)  . Stroke ~ 1999    ischemic / right cerebellar/posterior inferior cerebellar artery / right pos infarct  . Melanoma     "top of my head" (09/27/2012)   . Prostate cancer, primary, with metastasis from prostate to other site     on Lupron injections per GU  . Osteoarthritis of both feet   . S/P radiation therapy 02/13/14-02/27/14    SRS 5/5 spine completed 02/27/14  . S/P radiation therapy 05/27/14-05/31/14    right femur 20Gy/66fx   Past Surgical History  Procedure Laterality Date  . Nasal sinus surgery  1998  . Cataract extraction w/ intraocular lens  implant, bilateral Bilateral ~ 2011  . Skin graft Right 2010  . Melanoma excision  2010    "pre-melanoma on top of head; did skin graft from left thigh to cover" (09/27/2012)  . Tonsillectomy  ~ 1935   Social History:  reports that he quit smoking about 61 years ago. His smoking use included Cigarettes. He quit after .5 years of use. He has never used smokeless tobacco. He reports that he drinks alcohol. He reports that he does not use illicit drugs. Where does patient live home. Can patient participate in ADLs? Yes.  Allergies  Allergen Reactions  . Pravachol     Muscle weakness  . Zocor [Simvastatin - High Dose]     Muscle weakness    Family History:  Family History  Problem Relation Age of Onset  . Heart failure Mother   . Heart attack Father       Prior to Admission medications   Medication Sig Start Date End Date Taking? Authorizing Provider  acetaminophen (TYLENOL) 500 MG tablet Take 500 mg by  mouth See admin instructions. Takes two tablets in the morning and two tablets at night   Yes Historical Provider, MD  amLODipine (NORVASC) 5 MG tablet Take 1 tablet (5 mg total) by mouth daily. 02/12/14 02/12/15 Yes Ripudeep Krystal Eaton, MD  atorvastatin (LIPITOR) 10 MG tablet Take 1 tablet (10 mg total) by mouth daily. 09/12/13  Yes [name], MD  B Complex-C (B-COMPLEX WITH VITAMIN C) tablet Take 1 tablet by mouth daily.     Yes Historical Provider, MD  Denosumab (XGEVA [place]) Inject into the skin every 30 (thirty) days. Monthly. Last inj was in April 2016. Wife not sure what dose it is    Yes Historical Provider, MD  Docusate Calcium (STOOL SOFTENER PO) Take 1 capsule by mouth 2 (two) times daily.   Yes Historical Provider, MD  enzalutamide [name]) 40 MG capsule Take 40 mg by mouth daily.   Yes Historical Provider, MD  furosemide (LASIX) 40 MG tablet Take 1 tablet (40 mg total) by mouth daily. 03/01/14  Yes [name], MD  Leuprolide Acetate (LUPRON DEPOT IM) Inject 1 each into the muscle every 6 (six) months. Last inj was in March 2016   Yes Historical Provider, MD  losartan (COZAAR) 100 MG tablet Take 1 tablet (100 mg total) by mouth daily. 05/06/14  Yes [name], MD  multivitamin [facility]) per tablet Take 1 tablet by mouth daily. ( with B - Complex )   Yes Historical Provider, MD  Omega-3 Fatty Acids (FISH OIL PO) Take 1 capsule by mouth daily. ( Mega Red )   Yes Historical Provider, MD  tamsulosin (FLOMAX) 0.4 MG CAPS capsule Take 1 capsule (0.4 mg total) by mouth daily. 04/23/14  Yes Oswald Hillock, MD  warfarin (COUMADIN) 5 MG tablet Take 1 tablet (5 mg total) by mouth as directed. Patient taking differently: Take 2.5-5 mg by mouth daily. 2.5mg  every day except on wednesdays take 5mg  per wife 01/07/14  Yes [name], MD  ZETIA 10 MG tablet TAKE 1 TABLET BY MOUTH ONCE DAILY 02/01/14  Yes [name], MD    Physical Exam: Filed Vitals:   07/11/14 2200 07/11/14 2245 07/11/14 2330 07/12/14 0006  BP: 188/78 186/81 208/89 208/89  Pulse: 72 63 77 68  Temp:      TempSrc:      Resp: 14 16 19 20   SpO2: 100% 100% 99% 99%     General:  Moderately built and nourished.  Eyes: Anicteric no pallor.  ENT: No discharge from the ears eyes nose and mouth.  Neck: No mass felt.  Cardiovascular: S1 and S2 heard.  Respiratory: No rhonchi or crepitations.  Abdomen: Soft nontender bowel sounds present.  Skin: No rash.  Musculoskeletal: Bilateral lower extremity edema.  Psychiatric: Appears normal.  Neurologic: Alert and oriented to time place and  person. Moves all extremities.  Labs on Admission:  Basic Metabolic Panel:  Recent Labs Lab 07/11/14 2027  NA 142  K 4.2  CL 103  CO2 29  GLUCOSE 118*  BUN 30*  CREATININE 1.24  CALCIUM 10.4*   Liver Function Tests:  Recent Labs Lab 07/11/14 2027  AST 17  ALT 13*  ALKPHOS 64  BILITOT 0.6  PROT 6.9  ALBUMIN 3.1*   No results for input(s): LIPASE, AMYLASE in the last 168 hours. No results for input(s): AMMONIA in the last 168 hours. CBC:  Recent Labs Lab 07/11/14 2027  WBC 5.9  NEUTROABS 2.9  HGB 13.2  HCT 41.2  MCV 95.8  PLT 182   Cardiac Enzymes:  Recent Labs Lab 07/11/14 2027  TROPONINI <0.03    BNP (last 3 results) No results for input(s): BNP in the last 8760 hours.  ProBNP (last 3 results)  Recent Labs  02/08/14 1148  PROBNP 6362.0*    CBG: No results for input(s): GLUCAP in the last 168 hours.  Radiological Exams on Admission: Ct Angio Chest Pe W/cm &/or Wo Cm  07/11/2014   CLINICAL DATA:  Hemoptysis today. History of chronic anticoagulant use, prostate cancer, melanoma, pneumonia, hypertension.  EXAM: CT ANGIOGRAPHY CHEST WITH CONTRAST  TECHNIQUE: Multidetector CT imaging of the chest was performed using the standard protocol during bolus administration of intravenous contrast. Multiplanar CT image reconstructions and MIPs were obtained to evaluate the vascular anatomy.  CONTRAST:  126mL OMNIPAQUE IOHEXOL 350 MG/ML SOLN  COMPARISON:  Chest radiograph April 18, 2014 and MRI of the thoracic spine February 09, 2014  FINDINGS: PULMONARY ARTERY: Adequate contrast opacification of the pulmonary artery's. Main pulmonary artery is enlarged, 4.1 cm in transaxial dimension. Linear filling defect in the LEFT lower lobe bronchus, adherent to the anterior wall of the pulmonary artery, propagating into LEFT lower lobe segmental branch with occlusion.  MEDIASTINUM: The heart is moderately enlarged, no right heart strain. Calcific atherosclerosis of the  aortic arch with small eccentric undersurface chronic dissection with peripheral calcification. 16 mm short axis aortopulmonary window lymph node, 14 mm short axis pretracheal lymph node. Calcified LEFT hilar lymph nodes.  LUNGS: Tracheobronchial tree is patent, no pneumothorax. Small RIGHT greater than LEFT layering pleural effusions. Mild bronchial wall thickening diffusely. No focal consolidation. Atelectasis in the lower lobes, RIGHT middle lobe and lingula. LEFT lower lobe calcified granuloma.  SOFT TISSUES AND OSSEOUS STRUCTURES: Included view of the abdomen demonstrates calcified splenic granulomas, no acute process. Status post T11 vertebral bodies cement augmentation, similar T6 moderate compression fracture. New linear sclerosis through T9 vertebral body concerning for is early insufficiency fracture without height loss.  Review of the MIP images confirms the above findings.  IMPRESSION: Chronic appearing LEFT lower lobe pulmonary embolus, occlusive within the segmental branch, without convincing evidence of acute pulmonary embolus. Moderate cardiomegaly without RIGHT heart strain. Enlarged main pulmonary artery most consistent with chronic pulmonary arterial hypertension.  Small bilateral pleural effusions. Bronchial wall thickening most consistent bronchitis.  New linear sclerosis in vertebral body T9 concerning for early insufficiency fracture without height loss.   Electronically Signed   By: Elon Alas   On: 07/11/2014 22:59     Assessment/Plan Principal Problem:   Hemoptysis Active Problems:   Chronic atrial fibrillation   Prostate cancer metastatic to bone   Chronic kidney disease (CKD), stage III (moderate)   Hypercalcemia   Pulmonary embolus   Hypertensive urgency   1. Hemoptysis -I have discussed with Dr. Oletta Darter on-call pulmonary critical care doctor who will be seeing patient in consult. Dr. Oletta Darter feels that patient's hemoptysis may be from his PE. At this time pulmonary  critical care has requested patient to be on IV heparin and closely observe for any further hemoptysis or shortness of breath. No Coumadin has been ordered for now. 2. Pulmonary embolism - appears chronic in the CT. Patient does have bilateral lower extremity edema. Does have been ordered. Patient is on heparin see #1. 3. Chronic atrial fibrillation presently rate controlled presently on heparin see #1. 4. Hypertension - continue home medication. 5. Chronic diastolic heart failure - last EF measured was 55-60%. Patient appears compensated.  6. Metastatic prostate  cancer per oncologist. 7. Hypercalcemia may be cancer related - follow metabolic panel.   DVT Prophylaxisheparin infusion.  Code Status: Full code.  Family Communication: Patient's wife.  Disposition Plan: Admit to inpatient. Likely stay 3 days.    Lucious Zou N. [facility] Pager [phone].  If 7PM-7AM, please contact night-coverage [email or web address] Password [email or web address] 07/12/2014, 12:35 AM

## 2014-07-12 NOTE — Progress Notes (Signed)
*  PRELIMINARY RESULTS* Vascular Ultrasound Lower extremity venous duplex has been completed.  Preliminary findings: negative for DVT  Landry Mellow, RDMS, RVT  07/12/2014, 10:35 AM

## 2014-07-13 DIAGNOSIS — C61 Malignant neoplasm of prostate: Secondary | ICD-10-CM

## 2014-07-13 DIAGNOSIS — C7951 Secondary malignant neoplasm of bone: Secondary | ICD-10-CM

## 2014-07-13 LAB — CBC
HEMATOCRIT: 34.5 % — AB (ref 39.0–52.0)
Hemoglobin: 11.5 g/dL — ABNORMAL LOW (ref 13.0–17.0)
MCH: 32 pg (ref 26.0–34.0)
MCHC: 33.3 g/dL (ref 30.0–36.0)
MCV: 96.1 fL (ref 78.0–100.0)
PLATELETS: 153 10*3/uL (ref 150–400)
RBC: 3.59 MIL/uL — ABNORMAL LOW (ref 4.22–5.81)
RDW: 14.9 % (ref 11.5–15.5)
WBC: 7.3 10*3/uL (ref 4.0–10.5)

## 2014-07-13 LAB — HEPARIN LEVEL (UNFRACTIONATED): Heparin Unfractionated: 0.22 IU/mL — ABNORMAL LOW (ref 0.30–0.70)

## 2014-07-13 MED ORDER — AZITHROMYCIN 250 MG PO TABS
250.0000 mg | ORAL_TABLET | Freq: Every day | ORAL | Status: DC
  Administered 2014-07-14: 250 mg via ORAL
  Filled 2014-07-13: qty 1

## 2014-07-13 MED ORDER — BENZONATATE 100 MG PO CAPS
200.0000 mg | ORAL_CAPSULE | Freq: Three times a day (TID) | ORAL | Status: DC | PRN
  Administered 2014-07-13 – 2014-07-19 (×13): 200 mg via ORAL
  Filled 2014-07-13 (×15): qty 2

## 2014-07-13 MED ORDER — AZITHROMYCIN 250 MG PO TABS
500.0000 mg | ORAL_TABLET | Freq: Every day | ORAL | Status: AC
  Administered 2014-07-13: 500 mg via ORAL
  Filled 2014-07-13: qty 2

## 2014-07-13 NOTE — Progress Notes (Signed)
OT Note  Patient Details Name: [name]. MRN: [id number] DOB: 1924/08/06   Cancelled Treatment:    Reason Eval/Treat Not Completed: OT screened, no needs identified, will sign off. Spoke to patient and wife. Wife reports patient just completed HHPT/OT at home and there was "nothing more that they could do for him." At baseline, wife assists with transfers with use of a gait belt, and she also assists him with most ADLs.  Patient is non-ambulatory per wife and just performs transfers. She reports she hires a Physiological scientist to come and work on exercises with patient. She reports patient needs assistance with most ADLs at baseline except for grooming and feeding. She assists him with ADLs without difficulty. She denies need for OT at this time.  Jeanette Rauth A 07/13/2014, 12:03 PM

## 2014-07-13 NOTE — Progress Notes (Signed)
PT Cancellation Note  Patient Details Name: Landynn Dupler. MRN: [id number] DOB: [birth year]   Cancelled Treatment:    Reason Eval/Treat Not Completed: Fatigue/lethargy limiting ability to participate.  Patient very lethargic.  Wife reports patient transfers to w/c with RW and her assist at home (she uses gait belt).  Reports patient was up in recliner today.  Wife requests that we come back on Monday to check on patient - possible PT evaluation depending on bronchoscopy completion/results.   Despina Pole 07/13/2014, 4:30 PM Carita Pian. Sanjuana Kava, [place] Pager [phone]

## 2014-07-13 NOTE — Consult Note (Signed)
Name: Craig [name]. 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  Patient reports he had a bronchitis episode and was coughing for 1 wk then on the day of presentation to the hospital had an episode where he coughed up a cup of blood.  That resolved spontaneously and has not occurred since.  Patient is on coumadin for a-fib.  Now on heparin drip.  CTA showed a "chronic" PE.  Also of note, prostate cancer with mets to the spine on lupron.  SIGNIFICANT EVENTS  5/19 hemoptysis.  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  Patient reports he had a bronchitis episode and was coughing for 1 wk then on the day of presentation to the hospital had an episode where he coughed up a cup of blood.  That resolved spontaneously and has not occurred since.  Patient is on coumadin for a-fib.  Now on heparin drip.  CTA showed a "chronic" PE.  Also of note, prostate cancer with mets to the spine on lupron.   SUBJECTIVE: Second episode of moderate hemoptysis early this morning, first since arrival. Now stopped. Wife in room.  VITAL SIGNS: Temp:  [98.5 F (36.9 C)-98.7 F (37.1 C)] 98.7 F (37.1 C) (05/21 0500) Pulse Rate:  [65-77] 65 (05/21 0500) Resp:  [14-16] 16 (05/20 2013) BP: (147-181)/(51-75) 147/51 mmHg (05/21 0500) SpO2:  [97 %-100 %] 97 % (05/21 0500) Weight:  [82.691 kg (182 lb 4.8 oz)] 82.691 kg (182 lb 4.8 oz) (05/21 0500)  PHYSICAL EXAMINATION: General:  Elderly appearing male, NAD. Neuro:  Alert and interactive, following all commands. Head: [place]/AT EENT:  PERRL, EOM-I  and MMM Cardiovascular:  RRR, Nl S1/S2, -M/R/G. Lungs:  CTA bilaterally. Dry hacking cough Abdomen:  Soft, NT, ND and +BS. Musculoskeletal:  -edema and -tenderness. SCDs in place Skin:  Intact.  Recent Labs Lab 07/11/14 2027 07/12/14 0312  NA 142 139  K 4.2 4.0  CL 103 105  CO2 29 27  BUN 30* 30*  CREATININE 1.24 1.24  GLUCOSE 118* 96    Recent Labs Lab 07/12/14 0312 07/12/14 1119 07/13/14 0658  HGB 12.1* 11.9* 11.5*  HCT 37.0* 36.6* 34.5*  WBC 6.6 7.0 7.3  PLT 168 165 153   Ct Angio Chest Pe W/cm &/or Wo Cm  07/11/2014   CLINICAL DATA:  Hemoptysis today. History of chronic anticoagulant use, prostate cancer, melanoma, pneumonia, hypertension.  EXAM: CT ANGIOGRAPHY CHEST WITH CONTRAST  TECHNIQUE: Multidetector CT imaging of the chest was performed using the standard protocol during bolus administration of intravenous contrast. Multiplanar CT image reconstructions and MIPs were obtained to evaluate the vascular anatomy.  CONTRAST:  127mL OMNIPAQUE IOHEXOL 350 MG/ML SOLN  COMPARISON:  Chest radiograph April 18, 2014 and MRI of the thoracic spine February 09, 2014  FINDINGS: PULMONARY ARTERY: Adequate contrast opacification of the pulmonary artery's. Main pulmonary artery is enlarged, 4.1 cm in transaxial dimension. Linear filling defect in the LEFT lower lobe bronchus, adherent to the anterior wall of the pulmonary artery, propagating into LEFT lower lobe segmental branch with occlusion.  MEDIASTINUM: The heart  is moderately enlarged, no right heart strain. Calcific atherosclerosis of the aortic arch with small eccentric undersurface chronic dissection with peripheral calcification. 16 mm short axis aortopulmonary window lymph node, 14 mm short axis pretracheal lymph node. Calcified LEFT hilar lymph nodes.  LUNGS: Tracheobronchial tree is patent, no pneumothorax. Small RIGHT greater than LEFT layering pleural effusions. Mild bronchial wall thickening diffusely. No focal consolidation.  Atelectasis in the lower lobes, RIGHT middle lobe and lingula. LEFT lower lobe calcified granuloma.  SOFT TISSUES AND OSSEOUS STRUCTURES: Included view of the abdomen demonstrates calcified splenic granulomas, no acute process. Status post T11 vertebral bodies cement augmentation, similar T6 moderate compression fracture. New linear sclerosis through T9 vertebral body concerning for is early insufficiency fracture without height loss.  Review of the MIP images confirms the above findings.  IMPRESSION: Chronic appearing LEFT lower lobe pulmonary embolus, occlusive within the segmental branch, without convincing evidence of acute pulmonary embolus. Moderate cardiomegaly without RIGHT heart strain. Enlarged main pulmonary artery most consistent with chronic pulmonary arterial hypertension.  Small bilateral pleural effusions. Bronchial wall thickening most consistent bronchitis.  New linear sclerosis in vertebral body T9 concerning for early insufficiency fracture without height loss.   Electronically Signed   By: Elon Alas   On: 07/11/2014 22:59   ASSESSMENT / PLAN:  81 year old with a single episode of hemoptysis, was coughing from bronchitis and on anti-coagulant, that is the most likely reason for hemoptysis.  This was a single episode with a  Chronic L PE present as well. I had bronchoscoped Dr [name] for hemoptysis about 20 years ago. Source then was calcified granuloma eroding into mainstem bronchus. He eventually coughed it out. Hilar calcifications attributed to histoplasmosis- grew up in [place]  I reviewed the chest CT myself at this visit, small PE noted, no significant infarct noted. Hilar calcifications again noted  Case discussed with patient and family and Dr Wynelle Cleveland  Hemoptysis: As above.  - I will discuss possible bronch with team on Monday, thinking to localize bleeding source.  - Hold heparin through weekend due to recurrent bleed . - I will add oral antibiotic for  bronchitis as discussed              - Adding benzonatate for cough  - Monitor closely.  Hypoxemia: due to PE.  - Supplemental O2.  - Titrate O2 down as able.  - Anticoagulation as ordered  - R/O DVT.  Anticoagulation: due to a-fib and old PE. Leg vein dopplers negative for DVT  - No IVC filter unless mobile clot.              - Suggest asking Dr Mare Ferrari to see patient for cardiology guidance on anticoagulation  PCCM will follow, please call back if hemoptysis recurs before our follow up.  Bartholomew Crews, MD PCCM p [phone] After hours pager: [phone].  07/13/2014, 9:49 AM

## 2014-07-13 NOTE — Progress Notes (Addendum)
[facility] Progress Note   Craig [name]. 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 CT scan showed a chronic pulmonary embolism. He was admitted for further management and a pulmonary consult was requested. Coumadin was held and he was placed on a heparin infusion and monitored for further hemoptysis.   Subjective: The patient had another episode of hemoptysis earlier this morning which was quite significant. He was coughing up bright red blood for a good 5 minutes. He continues to have a dry cough at this time. He has no chest pain and no complaints of shortness of breath.  Assessment/Plan: Principal Problem:  Hemoptysis- chronic pulmonary embolism -Initial episode occurred at home prior to admission-second episode today - heparin (atrial fibrillation) infusion has been discontinued -He has been evaluated by pulmonary, Dr. Annamaria Boots, who is considering a bronchoscopy on Monday -He has also started the patient on oral antibiotics for possible localized bronchitis and benzonatate to help reduce his cough -According to the history obtained from Dr. Annamaria Boots, about 20 years ago the patient had hemoptysis and on bronchoscopy was noted to have a granuloma that was eroding into the bronchus. He eventually coughed up the granuloma and hemoptysis resolved completely. It is suspected that his granulomas are related to having histoplasmosis in the past. -Venous duplex of lower extremities is negative for DVT  Active Problems:  Acute hypoxic respiratory failure -In relation to above-mentioned hemoptysis-requiring 2 L of oxygen   Chronic atrial  fibrillation -Home and in held on admission due to hemoptysis-she was transitioned to heparin however due to recurrent episode of hemoptysis, heparin has had to be discontinued -Last INR checked was 1.6 on 5/19 --rate controlled without any rate controlling agents -Patient asking for a consult with Dr. Mare Ferrari- he has been explained that he is not on call this weekend but we will call him as soon as he is available   Prostate cancer metastatic to bone -Status post combined androgen deprivation with Lupron and Casodex and stereotactic radiotherapy to bone metastasis -Currently receiving Xtandi, Lupron every 6 months and Xgeva monthly  -CT obtained on admission shows sclerosis at the level of T9 which is concerning for an early insufficiency fracture-the patient has no complaints of back pain  Chronic Foley catheter -Foley catheter needs to be changed this coming Tuesday -Continue Flomax  Hypertension -Continue amlodipine and losartan   Chronic kidney disease (CKD), stage III (moderate) -Stable   Hypercalcemia -Mildly elevated calcium likely related to to prostate cancer with metastasis to the bone- according to his wife the patient was also noted to have metastases in his ribs and smaller metastases in his spine which did not require radiation   Code Status: Full code Family Communication: With wife at bedside Disposition Plan: Hold heparin and follow for further hemoptysis DVT prophylaxis: SCDs Consultants: Pulmonary Procedures: Lower extremity venous duplex  Antibiotics: Anti-infectives    Start     Dose/Rate Route Frequency Ordered Stop   07/14/14 1000  azithromycin (ZITHROMAX) tablet 250 mg     250 mg Oral Daily 07/13/14 1012 07/18/14 0959   07/13/14 1015  azithromycin (ZITHROMAX) tablet 500 mg     500 mg Oral Daily 07/13/14 1012 07/14/14 0959  Objective: Filed Weights   07/12/14 0100 07/13/14 0500  Weight: 83.8 kg (184 lb 11.9 oz) 82.691 kg (182 lb 4.8 oz)     Intake/Output Summary (Last 24 hours) at 07/13/14 1056 Last data filed at 07/13/14 0840  Gross per 24 hour  Intake 537.05 ml  Output   1575 ml  Net -1037.95 ml     Vitals Filed Vitals:   07/12/14 1128 07/12/14 2013 07/13/14 0500 07/13/14 0959  BP: 158/60 181/75 147/51 149/67  Pulse: 76 77 65 72  Temp:  98.5 F (36.9 C) 98.7 F (37.1 C)   TempSrc:  Oral Oral   Resp: 14 16    Height:   6' (1.829 m)   Weight:   82.691 kg (182 lb 4.8 oz)   SpO2: 100% 98% 97% 93%    Exam:  General:  Pt is alert, not in acute distress  HEENT: No icterus, No thrush  Cardiovascular: regular rate and rhythm, S1/S2 No murmur  Respiratory: clear to auscultation bilaterally   Abdomen: Soft, +Bowel sounds, non tender, non distended, no guarding  MSK: No LE edema, cyanosis or clubbing  Data Reviewed: Basic Metabolic Panel:  Recent Labs Lab 07/11/14 2027 07/12/14 0312  NA 142 139  K 4.2 4.0  CL 103 105  CO2 29 27  GLUCOSE 118* 96  BUN 30* 30*  CREATININE 1.24 1.24  CALCIUM 10.4* 9.9   Liver Function Tests:  Recent Labs Lab 07/11/14 2027 07/12/14 0312  AST 17 15  ALT 13* 12*  ALKPHOS 64 56  BILITOT 0.6 0.7  PROT 6.9 6.4*  ALBUMIN 3.1* 2.9*   No results for input(s): LIPASE, AMYLASE in the last 168 hours. No results for input(s): AMMONIA in the last 168 hours. CBC:  Recent Labs Lab 07/11/14 2027 07/12/14 0312 07/12/14 1119 07/13/14 0658  WBC 5.9 6.6 7.0 7.3  NEUTROABS 2.9 4.1  --   --   HGB 13.2 12.1* 11.9* 11.5*  HCT 41.2 37.0* 36.6* 34.5*  MCV 95.8 95.9 96.1 96.1  PLT 182 168 165 153   Cardiac Enzymes:  Recent Labs Lab 07/11/14 2027  TROPONINI <0.03   BNP (last 3 results) No results for input(s): BNP in the last 8760 hours.  ProBNP (last 3 results)  Recent Labs  02/08/14 1148  PROBNP 6362.0*    CBG: No results for input(s): GLUCAP in the last 168 hours.  Recent Results (from the past 240 hour(s))  Culture, blood (routine x 2)     Status:  None (Preliminary result)   Collection Time: 07/11/14  8:37 PM  Result Value Ref Range Status   Specimen Description BLOOD RIGHT FOREARM  Final   Special Requests BOTTLES DRAWN AEROBIC AND ANAEROBIC 5CC  Final   Culture   Final           BLOOD CULTURE RECEIVED NO GROWTH TO DATE CULTURE WILL BE HELD FOR 5 DAYS BEFORE ISSUING A FINAL NEGATIVE REPORT Performed at [facility]    Report Status PENDING  Incomplete  MRSA PCR Screening     Status: None   Collection Time: 07/12/14  2:22 AM  Result Value Ref Range Status   MRSA by PCR NEGATIVE NEGATIVE Final    Comment:        The GeneXpert MRSA Assay (FDA approved for NASAL specimens only), is one component of a comprehensive MRSA colonization surveillance program. It is not intended to diagnose MRSA infection nor to guide or monitor treatment for MRSA infections.  Studies: Ct Angio Chest Pe W/cm &/or Wo Cm  07/11/2014   CLINICAL DATA:  Hemoptysis today. History of chronic anticoagulant use, prostate cancer, melanoma, pneumonia, hypertension.  EXAM: CT ANGIOGRAPHY CHEST WITH CONTRAST  TECHNIQUE: Multidetector CT imaging of the chest was performed using the standard protocol during bolus administration of intravenous contrast. Multiplanar CT image reconstructions and MIPs were obtained to evaluate the vascular anatomy.  CONTRAST:  174mL OMNIPAQUE IOHEXOL 350 MG/ML SOLN  COMPARISON:  Chest radiograph April 18, 2014 and MRI of the thoracic spine February 09, 2014  FINDINGS: PULMONARY ARTERY: Adequate contrast opacification of the pulmonary artery's. Main pulmonary artery is enlarged, 4.1 cm in transaxial dimension. Linear filling defect in the LEFT lower lobe bronchus, adherent to the anterior wall of the pulmonary artery, propagating into LEFT lower lobe segmental branch with occlusion.  MEDIASTINUM: The heart is moderately enlarged, no right heart strain. Calcific atherosclerosis of the aortic arch with small eccentric undersurface  chronic dissection with peripheral calcification. 16 mm short axis aortopulmonary window lymph node, 14 mm short axis pretracheal lymph node. Calcified LEFT hilar lymph nodes.  LUNGS: Tracheobronchial tree is patent, no pneumothorax. Small RIGHT greater than LEFT layering pleural effusions. Mild bronchial wall thickening diffusely. No focal consolidation. Atelectasis in the lower lobes, RIGHT middle lobe and lingula. LEFT lower lobe calcified granuloma.  SOFT TISSUES AND OSSEOUS STRUCTURES: Included view of the abdomen demonstrates calcified splenic granulomas, no acute process. Status post T11 vertebral bodies cement augmentation, similar T6 moderate compression fracture. New linear sclerosis through T9 vertebral body concerning for is early insufficiency fracture without height loss.  Review of the MIP images confirms the above findings.  IMPRESSION: Chronic appearing LEFT lower lobe pulmonary embolus, occlusive within the segmental branch, without convincing evidence of acute pulmonary embolus. Moderate cardiomegaly without RIGHT heart strain. Enlarged main pulmonary artery most consistent with chronic pulmonary arterial hypertension.  Small bilateral pleural effusions. Bronchial wall thickening most consistent bronchitis.  New linear sclerosis in vertebral body T9 concerning for early insufficiency fracture without height loss.   Electronically Signed   By: Elon Alas   On: 07/11/2014 22:59    Scheduled Meds:  Scheduled Meds: . amLODipine  5 mg Oral Daily  . atorvastatin  10 mg Oral Daily  . azithromycin  500 mg Oral Daily   Followed by  . [START ON 07/14/2014] azithromycin  250 mg Oral Daily  . docusate sodium  100 mg Oral BID  . enzalutamide  40 mg Oral Daily  . ezetimibe  10 mg Oral Daily  . furosemide  40 mg Oral Daily  . losartan  100 mg Oral Daily  . multivitamin with minerals  1 tablet Oral Daily  . omega-3 acid ethyl esters  1 g Oral BID  . sodium chloride  3 mL Intravenous Q12H   . tamsulosin  0.4 mg Oral Daily   Continuous Infusions:   Time spent on care of this patient: 35 minutes   [place], MD 07/13/2014, 10:56 AM  LOS: 2 days   [facility] Office  [phone] Pager - Text Page per [email or web address]  If 7PM-7AM, please contact night-coverage [email or web address]

## 2014-07-13 NOTE — Progress Notes (Signed)
This RN was notified by phlebotomist that pt is coughing up blood.  Hemoptysis with moderate amount of bright red blood and clots noted.  Kathline Magic, NP notified.  Heparin gtt D/C'd per NP's order.  Suction set up and yankauer given to pt.  O2 sats down to 81% during coughing episode.  O2 @ 2L/[place] with humidity applied.  Sats improved to 96% on 2L/[place].  Pt's gown and bed changed.  Bleeding subsided after about 5 mins.  Will continue to monitor.  Jodell Cipro

## 2014-07-14 LAB — CBC
HCT: 32.9 % — ABNORMAL LOW (ref 39.0–52.0)
Hemoglobin: 10.8 g/dL — ABNORMAL LOW (ref 13.0–17.0)
MCH: 31.5 pg (ref 26.0–34.0)
MCHC: 32.8 g/dL (ref 30.0–36.0)
MCV: 95.9 fL (ref 78.0–100.0)
Platelets: 150 K/uL (ref 150–400)
RBC: 3.43 MIL/uL — ABNORMAL LOW (ref 4.22–5.81)
RDW: 15.1 % (ref 11.5–15.5)
WBC: 7.7 K/uL (ref 4.0–10.5)

## 2014-07-14 MED ORDER — DEXTROMETHORPHAN POLISTIREX ER 30 MG/5ML PO SUER
15.0000 mg | Freq: Two times a day (BID) | ORAL | Status: DC | PRN
  Administered 2014-07-16 – 2014-07-18 (×5): 15 mg via ORAL
  Filled 2014-07-14 (×8): qty 5

## 2014-07-14 NOTE — Progress Notes (Signed)
Name: Craig [name]. 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  Patient reports he had a bronchitis episode and was coughing for 1 wk then on the day of presentation to the hospital had an episode where he coughed up a cup of blood.  That resolved spontaneously and has not occurred since.  Patient is on coumadin for a-fib.  Now on heparin drip.  CTA showed a "chronic" PE.  Also of note, prostate cancer with mets to the spine on lupron.  SIGNIFICANT EVENTS  5/19 hemoptysis.  STUDIES:  5/19 CTA with a "chronic" PE   SUBJECTIVE: Second episode of moderate hemoptysis early 5/21 morning, first since arrival.  Wife in room. Has slept with benzonatate, less cough and no more blood. Wife recognizes need to get him back on clot prophy soon.  VITAL SIGNS: Temp:  [98.2 F (36.8 C)-99.2 F (37.3 C)] 99.2 F (37.3 C) (05/22 0450) Pulse Rate:  [72-78] 77 (05/22 0450) Resp:  [17-18] 18 (05/21 2046) BP: (136-157)/(60-85) 146/85 mmHg (05/22 0450) SpO2:  [93 %-100 %] 97 % (05/22 0450) Weight:  [82.373 kg (181 lb 9.6 oz)] 82.373 kg (181 lb 9.6 oz) (05/22 0450)  PHYSICAL EXAMINATION: General:  Elderly appearing male, NAD. Reading paper Neuro:  Alert and interactive, following all commands. Head: [place]/AT EENT:  PERRL, EOM-I and MMM Cardiovascular:  RRR, Nl S1/S2, -M/R/G. Lungs:  CTA bilaterally. Dry hacking cough noted Abdomen:  Soft, NT, ND and +BS. Musculoskeletal:  -edema and -tenderness. SCDs in place Skin:  Intact.  Recent Labs Lab 07/11/14 2027 07/12/14 0312  NA 142 139  K 4.2 4.0  CL 103 105  CO2 29 27  BUN 30* 30*  CREATININE 1.24 1.24  GLUCOSE 118* 96    Recent Labs Lab 07/12/14 1119 07/13/14 0658  07/14/14 0524  HGB 11.9* 11.5* 10.8*  HCT 36.6* 34.5* 32.9*  WBC 7.0 7.3 7.7  PLT 165 153 150   No results found. ASSESSMENT / PLAN:  81 year old with 2 episodes of hemoptysis, was coughing from bronchitis and on anti-coagulant, that is the most likely reason for hemoptysis.  This was a single episode with a  Chronic L PE present as well. I had bronchoscoped Dr [name] for hemoptysis about 20 years ago. Source then was calcified granuloma eroding into mainstem bronchus. He eventually coughed it out. Hilar calcifications attributed to histoplasmosis- grew up in [place]  Case discussed with patient and family  Hemoptysis: As above.  - I will discuss possible bronch with team on Monday, thinking to localize bleeding source. Have to review schedule options.  - Hold heparin through weekend due to recurrent bleed . - Added oral antibiotic Zith for bronchitis as discussed              - Added benzonatate for cough  - Monitor closely.  Hypoxemia: due to PE.  - Supplemental O2.  - Titrate O2 down as able.  - Anticoagulation as ordered  - R/O DVT.  Anticoagulation: due to a-fib and old PE. Leg vein dopplers negative for DVT  - No IVC filter unless mobile clot.              - Suggest asking Dr Mare Ferrari to see patient  for cardiology guidance on anticoagulation              - Continue SCDs  PCCM will follow, please call back if hemoptysis recurs before our follow up.  Bartholomew Crews, MD PCCM p [phone] After hours pager: [phone].  07/14/2014, 9:10 AM

## 2014-07-14 NOTE — Progress Notes (Signed)
[facility] Progress Note   Craig [name]. NGE:[id number] DOB: May 25, 1924 DOA: 07/11/2014 PCP: [name], MD  Brief narrative: Craig Lunde. is a 81 y.o. male, retired Stage manager with a PMH of prostate cancer with bone metastases currently on treatment, chronic indwelling Foley, atrial fibrillation on Coumadin, chronic diastolic heart failure, hypertension who presents to the hospital for hemoptysis 1 episode. CT scan showed a chronic pulmonary embolism. He was admitted for further management and a pulmonary consult was requested. Coumadin was held and he was placed on a heparin infusion and monitored for further hemoptysis.   Subjective: No further episodes of hemoptysis since yesterday morning. He continues to have a dry cough. No shortness of breath. No nausea vomiting abdominal pain or diarrhea.  Assessment/Plan: Principal Problem:  Hemoptysis- chronic pulmonary embolism -Initial episode occurred at home prior to admission-second episode in the morning of 5/21- heparin (atrial fibrillation) infusion discontinued at that time -He has been evaluated by pulmonary, Dr. Annamaria Boots, who is considering a bronchoscopy on Monday -He has also started the patient on oral antibiotics for possible localized bronchitis and benzonatate to help reduce his cough -According to the history obtained from Dr. Annamaria Boots, about 20 years ago the patient had hemoptysis and on bronchoscopy was noted to have a granuloma that was eroding into the bronchus. He eventually coughed up the granuloma and hemoptysis resolved completely. It is suspected that his granulomas are related to having histoplasmosis in the past. -Venous duplex of lower extremities is negative for DVT  Active Problems:  Acute hypoxic respiratory failure -In relation to above-mentioned hemoptysis-requiring 2 L of oxygen   Chronic atrial fibrillation -Coumadin held on admission due to hemoptysis-he was transitioned to heparin  however due to recurrent episode of hemoptysis, heparin has had to be discontinued -Last INR checked was 1.6 on 5/19 --rate controlled without any rate controlling agents -Patient asking for a consult with Dr. Mare Ferrari- he has been explained that he is not on call this weekend but we will call him as soon as he is available   Prostate cancer metastatic to bone -Status post combined androgen deprivation with Lupron and Casodex and stereotactic radiotherapy to bone metastasis -Currently receiving Xtandi, Lupron every 6 months and Xgeva monthly  -CT obtained on admission shows sclerosis at the level of T9 which is concerning for an early insufficiency fracture-the patient has no complaints of back pain  Chronic Foley catheter -Foley catheter needs to be changed this coming Tuesday -Continue Flomax  Hypertension -Continue amlodipine and losartan   Chronic kidney disease (CKD), stage III (moderate) -Stable   Hypercalcemia -Mildly elevated calcium likely related to to prostate cancer with metastasis to the bone- according to his wife the patient was also noted to have metastases in his ribs and smaller metastases in his spine which did not require radiation   Code Status: Full code Family Communication: With wife at bedside Disposition Plan: Hold heparin and follow for further hemoptysis DVT prophylaxis: SCDs Consultants: Pulmonary Procedures: Lower extremity venous duplex  Antibiotics: Anti-infectives    Start     Dose/Rate Route Frequency Ordered Stop   07/14/14 1000  azithromycin (ZITHROMAX) tablet 250 mg     250 mg Oral Daily 07/13/14 1012 07/18/14 0959   07/13/14 1015  azithromycin (ZITHROMAX) tablet 500 mg     500 mg Oral Daily 07/13/14 1012 07/13/14 1059      Objective: Filed Weights   07/12/14 0100 07/13/14 0500 07/14/14 0450  Weight: 83.8 kg (184 lb 11.9 oz)  82.691 kg (182 lb 4.8 oz) 82.373 kg (181 lb 9.6 oz)    Intake/Output Summary (Last 24 hours) at 07/14/14  1017 Last data filed at 07/14/14 0452  Gross per 24 hour  Intake    195 ml  Output   1375 ml  Net  -1180 ml     Vitals Filed Vitals:   07/13/14 1338 07/13/14 2046 07/14/14 0450 07/14/14 0940  BP: 136/62 157/60 146/85 155/62  Pulse: 78 73 77   Temp: 98.2 F (36.8 C) 98.6 F (37 C) 99.2 F (37.3 C)   TempSrc: Oral Oral Oral   Resp: 17 18    Height:      Weight:   82.373 kg (181 lb 9.6 oz)   SpO2: 100% 98% 97%     Exam:  General:  Pt is alert, not in acute distress  HEENT: No icterus, No thrush  Cardiovascular: regular rate and rhythm, S1/S2 No murmur  Respiratory: clear to auscultation bilaterally   Abdomen: Soft, +Bowel sounds, non tender, non distended, no guarding  MSK: No LE edema, cyanosis or clubbing  Data Reviewed: Basic Metabolic Panel:  Recent Labs Lab 07/11/14 2027 07/12/14 0312  NA 142 139  K 4.2 4.0  CL 103 105  CO2 29 27  GLUCOSE 118* 96  BUN 30* 30*  CREATININE 1.24 1.24  CALCIUM 10.4* 9.9   Liver Function Tests:  Recent Labs Lab 07/11/14 2027 07/12/14 0312  AST 17 15  ALT 13* 12*  ALKPHOS 64 56  BILITOT 0.6 0.7  PROT 6.9 6.4*  ALBUMIN 3.1* 2.9*   No results for input(s): LIPASE, AMYLASE in the last 168 hours. No results for input(s): AMMONIA in the last 168 hours. CBC:  Recent Labs Lab 07/11/14 2027 07/12/14 0312 07/12/14 1119 07/13/14 0658 07/14/14 0524  WBC 5.9 6.6 7.0 7.3 7.7  NEUTROABS 2.9 4.1  --   --   --   HGB 13.2 12.1* 11.9* 11.5* 10.8*  HCT 41.2 37.0* 36.6* 34.5* 32.9*  MCV 95.8 95.9 96.1 96.1 95.9  PLT 182 168 165 153 150   Cardiac Enzymes:  Recent Labs Lab 07/11/14 2027  TROPONINI <0.03   BNP (last 3 results) No results for input(s): BNP in the last 8760 hours.  ProBNP (last 3 results)  Recent Labs  02/08/14 1148  PROBNP 6362.0*    CBG: No results for input(s): GLUCAP in the last 168 hours.  Recent Results (from the past 240 hour(s))  Culture, blood (routine x 2)     Status: None  (Preliminary result)   Collection Time: 07/11/14  8:37 PM  Result Value Ref Range Status   Specimen Description BLOOD RIGHT FOREARM  Final   Special Requests BOTTLES DRAWN AEROBIC AND ANAEROBIC 5CC  Final   Culture   Final           BLOOD CULTURE RECEIVED NO GROWTH TO DATE CULTURE WILL BE HELD FOR 5 DAYS BEFORE ISSUING A FINAL NEGATIVE REPORT Performed at [facility]    Report Status PENDING  Incomplete  MRSA PCR Screening     Status: None   Collection Time: 07/12/14  2:22 AM  Result Value Ref Range Status   MRSA by PCR NEGATIVE NEGATIVE Final    Comment:        The GeneXpert MRSA Assay (FDA approved for NASAL specimens only), is one component of a comprehensive MRSA colonization surveillance program. It is not intended to diagnose MRSA infection nor to guide or monitor treatment for MRSA infections.  Studies: No results found.  Scheduled Meds:  Scheduled Meds: . amLODipine  5 mg Oral Daily  . atorvastatin  10 mg Oral Daily  . azithromycin  250 mg Oral Daily  . docusate sodium  100 mg Oral BID  . enzalutamide  40 mg Oral Daily  . ezetimibe  10 mg Oral Daily  . furosemide  40 mg Oral Daily  . losartan  100 mg Oral Daily  . multivitamin with minerals  1 tablet Oral Daily  . omega-3 acid ethyl esters  1 g Oral BID  . sodium chloride  3 mL Intravenous Q12H  . tamsulosin  0.4 mg Oral Daily   Continuous Infusions:   Time spent on care of this patient: 35 minutes   Hind[place], MD 07/14/2014, 10:17 AM  LOS: 3 days   [facility] Office  [phone] Pager - Text Page per [email or web address]  If 7PM-7AM, please contact night-coverage [email or web address]

## 2014-07-15 ENCOUNTER — Inpatient Hospital Stay (HOSPITAL_COMMUNITY): Payer: Medicare Other

## 2014-07-15 ENCOUNTER — Observation Stay (HOSPITAL_COMMUNITY): Payer: Medicare Other

## 2014-07-15 ENCOUNTER — Encounter (HOSPITAL_COMMUNITY)
Admission: EM | Disposition: A | Discharge status: Home / Self Care | Payer: Self-pay | Source: Home / Self Care | Attending: Internal Medicine

## 2014-07-15 DIAGNOSIS — R042 Hemoptysis: Secondary | ICD-10-CM | POA: Diagnosis not present

## 2014-07-15 LAB — TYPE AND SCREEN
ABO/RH(D): O POS
ANTIBODY SCREEN: NEGATIVE

## 2014-07-15 LAB — CBC
HEMATOCRIT: 31.8 % — AB (ref 39.0–52.0)
HEMOGLOBIN: 10.2 g/dL — AB (ref 13.0–17.0)
MCH: 30.5 pg (ref 26.0–34.0)
MCHC: 32.1 g/dL (ref 30.0–36.0)
MCV: 95.2 fL (ref 78.0–100.0)
PLATELETS: 163 10*3/uL (ref 150–400)
RBC: 3.34 MIL/uL — ABNORMAL LOW (ref 4.22–5.81)
RDW: 14.7 % (ref 11.5–15.5)
WBC: 8.5 10*3/uL (ref 4.0–10.5)

## 2014-07-15 LAB — MRSA PCR SCREENING: MRSA BY PCR: NEGATIVE

## 2014-07-15 LAB — LACTIC ACID, PLASMA: Lactic Acid, Venous: 0.9 mmol/L (ref 0.5–2.0)

## 2014-07-15 LAB — PROCALCITONIN

## 2014-07-15 LAB — GLUCOSE, CAPILLARY: GLUCOSE-CAPILLARY: 118 mg/dL — AB (ref 65–99)

## 2014-07-15 SURGERY — VIDEO BRONCHOSCOPY WITHOUT FLUORO
Anesthesia: Moderate Sedation | Laterality: Bilateral

## 2014-07-15 MED ORDER — CEFTRIAXONE SODIUM IN DEXTROSE 20 MG/ML IV SOLN
1.0000 g | INTRAVENOUS | Status: DC
  Administered 2014-07-15 – 2014-07-18 (×4): 1 g via INTRAVENOUS
  Filled 2014-07-15 (×4): qty 50

## 2014-07-15 MED ORDER — FENTANYL CITRATE (PF) 100 MCG/2ML IJ SOLN
INTRAMUSCULAR | Status: AC
  Administered 2014-07-15: 12:00:00
  Filled 2014-07-15: qty 4

## 2014-07-15 MED ORDER — AZITHROMYCIN 500 MG IV SOLR
500.0000 mg | INTRAVENOUS | Status: DC
  Administered 2014-07-15 – 2014-07-16 (×2): 500 mg via INTRAVENOUS
  Filled 2014-07-15 (×2): qty 500

## 2014-07-15 MED ORDER — MIDAZOLAM HCL 2 MG/2ML IJ SOLN
INTRAMUSCULAR | Status: AC
  Administered 2014-07-15: 4 mg
  Filled 2014-07-15: qty 10

## 2014-07-15 NOTE — Procedures (Signed)
Bronchoscopy Procedure Note Georg Ang [id number] [birth year]  Procedure: Bronchoscopy Indications: Hemoptysis  Procedure Details Consent: Risks of procedure as well as the alternatives and risks of each were explained to the (patient/caregiver).  Consent for procedure obtained. Time Out: Verified patient identification, verified procedure, site/side was marked, verified correct patient position, special equipment/implants available, medications/allergies/relevent history reviewed, required imaging and test results available.  Performed  81 yo male with progressive hemoptysis.  He was brought to ICU for bronchoscopy.    He was given 4 mg versed, 100 mcg fentanyl for sedation and analgesia.  He had cetacaine administered for topical anesthesia of airway.    Bronchoscope was entered orally.  He had pooling of blood in posterior pharynx.  Vocal cords had normal motion w/o obvious lesions.  He was given 1% lidocaine as needed for topical anesthesia of airways.  Bronchoscope was entered into the trachea.  There was blood secretions coming from both airways.   Bronchoscope was entered into Rt main bronchus.  The Rt upper, middle, and lower lobes were all visualized.  There were bloody secretions from the airways that were easily suctioned w/o recurrence >> likely spilling over from Lt lung.  No endobronchial lesions.  Bronchoscope was entered into Lt main bronchus.  Lt upper, lingular, and lower lobes were all visualized.  There were bloody secretions from airways.  There was also mass like lesion eminating from lingular and Lt lower lobe orifices.  Endobronchial biopsy from both locations done.  There was minimal bleeding that improved after saline and suctioning.  Bronchoscope then withdrawn.    No immediate complications.  He had transient fall in his oxygenation.  Plan Monitor in ICU Send endobronchial biopsy from lingular and lt lower lobe for surgical pathology Continue to hold  anti-coagulation for now  Chesley Mires, MD [place] 07/15/2014, 11:29 AM Pager:  [phone] After 3pm call: [phone]

## 2014-07-15 NOTE — Progress Notes (Signed)
[facility]  I was called urgently this morning when the patient began to cough up blood once again. When I went to evaluate him, I noted he was coughing up large amounts of bright red blood and therefore asked pulmonary critical care to evaluate him and consider urgent endoscopy. He is been transferred to the ICU, endoscopy performed revealing an endobronchial mass which was biopsied. He is currently on the critical care service. I will sign off at this time. Please call back [facility] when patient is ready to transfer out of the ICU.  Debbe Odea, MD

## 2014-07-15 NOTE — Progress Notes (Signed)
Video Bronchoscopy performed.  Intervention Bronchial biopsy  Patient tolerated well.  No complications noted.

## 2014-07-15 NOTE — Progress Notes (Signed)
   Name: Craig [name]. 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  Patient reports he had a bronchitis episode and was coughing for 1 wk then on the day of presentation to the hospital had an episode where he coughed up a cup of blood.  That resolved spontaneously and has not occurred since.  Patient is on coumadin for a-fib.  Now on heparin drip.  CTA showed a "chronic" PE.  Also of note, prostate cancer with mets to the spine on lupron.  SIGNIFICANT EVENTS  5/19 hemoptysis 5/23 to ICU with progressive hemoptysis  STUDIES:  5/19 CTA with a "chronic" PE   SUBJECTIVE:  Woke up this AM with cough and had significant increase in hemoptysis.  Denies chest pain.  Breathing okay on supplemental oxygen.  VITAL SIGNS: Temp:  [98.4 F (36.9 C)-100.7 F (38.2 C)] 100.7 F (38.2 C) (05/23 0903) Pulse Rate:  [64-82] 76 (05/22 2305) Resp:  [19-25] 25 (05/23 0903) BP: (145-163)/(57-75) 155/71 mmHg (05/23 0903) SpO2:  [96 %-100 %] 97 % (05/23 0903)  PHYSICAL EXAMINATION: General: ill appearing, intermittent cough Neuro: follows commands HEENT: no sinus tenderness, no oral exudate Cardiovascular: regular Lungs: scattered crackles, no wheeze Abdomen:  Soft, non tender Musculoskeletal: no edema Skin: no rashes  CBC Recent Labs     07/13/14  0658  07/14/14  0524  07/15/14  0310  WBC  7.3  7.7  8.5  HGB  11.5*  10.8*  10.2*  HCT  34.5*  32.9*  31.8*  PLT  153  150  163    Coag's No results for input(s): APTT, INR in the last 72 hours.  BMET No results for input(s): NA, K, CL, CO2, BUN, CREATININE, GLUCOSE in the last 72 hours.  Electrolytes No results for input(s): CALCIUM, MG, PHOS in the last 72  hours.  Sepsis Markers No results for input(s): PROCALCITON, O2SATVEN in the last 72 hours.  Invalid input(s): LACTICACIDVEN  ABG No results for input(s): PHART, PCO2ART, PO2ART in the last 72 hours.  Liver Enzymes No results for input(s): AST, ALT, ALKPHOS, BILITOT, ALBUMIN in the last 72 hours.  Cardiac Enzymes No results for input(s): TROPONINI, PROBNP in the last 72 hours.  Glucose No results for input(s): GLUCAP in the last 72 hours.  Imaging No results found.    ASSESSMENT / PLAN:  PULMONARY A: Hemoptysis. P: Move to ICU and plan for bronchoscopy F/u CXR  CARDIAC A: Hx of chronic PE. 2nd pulmonary HTN. Hx of HTN, HLD, A fib. P: Hold heparin in setting of hemoptysis Monitor hemodynamics Norvasc, lipitor, zetia, lasix, cozaar, lovaza  RENAL A: No acute issues. P: Monitor renal fx, urine outpt flomax  GASTROENTEROLOGY A: Nutrition. P: NPO for bronchoscopy  HEMATOLOGY A: Hx of metastatic prostate cancer. P: Enzalutamide  INFECTION A: Possible CAP. P: Rocephin, zithromax  ENDOCRINE A: No acute issues. P: Monitor blood sugar  NEUROLOGY A: No acute issues. P: Monitor mental status  Summary: Will transfer to ICU in setting of progressive hemoptysis and will arrange for bronchoscopy.  Updated pt's wife at bedside.  CC time 40 minutes.  Chesley Mires, MD [facility] Pulmonary/Critical Care 07/15/2014, 9:26 AM Pager:  [phone] After 3pm call: [phone]

## 2014-07-15 NOTE — Progress Notes (Signed)
PT Cancellation Note  Patient Details Name: Craig [name]. MRN: [id number] DOB: [birth year]   Cancelled Treatment:    Reason Eval/Treat Not Completed: Patient not medically ready. Pt transferred from 3W to 59M due to progression of hemoptysis. D/C PT at this time due to medical decline. Please re-consult, if needed. PT signing off.   Lorriane Shire 07/15/2014, 10:21 AM

## 2014-07-15 NOTE — Progress Notes (Signed)
Called to pt room because pt was having episode of hemoptysis. Pt remained alert and oriented while episode was happening. Pt approximately coughed up 50-100cc of blood. Vital sign stable. Rapid Response called to room. MD notified and came to see pt. Order to transfer pt to ICU. Report called to [place], Therapist, sports. Will continue to monitor.

## 2014-07-16 ENCOUNTER — Telehealth: Payer: Self-pay | Admitting: Cardiology

## 2014-07-16 ENCOUNTER — Inpatient Hospital Stay (HOSPITAL_COMMUNITY): Payer: Medicare Other

## 2014-07-16 DIAGNOSIS — I482 Chronic atrial fibrillation: Secondary | ICD-10-CM

## 2014-07-16 DIAGNOSIS — N183 Chronic kidney disease, stage 3 (moderate): Secondary | ICD-10-CM

## 2014-07-16 LAB — CBC
HCT: 31.1 % — ABNORMAL LOW (ref 39.0–52.0)
HEMOGLOBIN: 9.9 g/dL — AB (ref 13.0–17.0)
MCH: 30.6 pg (ref 26.0–34.0)
MCHC: 31.8 g/dL (ref 30.0–36.0)
MCV: 96 fL (ref 78.0–100.0)
Platelets: 157 10*3/uL (ref 150–400)
RBC: 3.24 MIL/uL — AB (ref 4.22–5.81)
RDW: 14.5 % (ref 11.5–15.5)
WBC: 11.1 10*3/uL — ABNORMAL HIGH (ref 4.0–10.5)

## 2014-07-16 LAB — PROCALCITONIN: Procalcitonin: 0.29 ng/mL

## 2014-07-16 LAB — POCT I-STAT 3, ART BLOOD GAS (G3+)
ACID-BASE DEFICIT: 20 mmol/L — AB (ref 0.0–2.0)
Bicarbonate: 8.7 mEq/L — ABNORMAL LOW (ref 20.0–24.0)
O2 SAT: 80 %
PCO2 ART: 30.7 mmHg — AB (ref 35.0–45.0)
PO2 ART: 61 mmHg — AB (ref 80.0–100.0)
TCO2: 10 mmol/L (ref 0–100)
pH, Arterial: 7.06 — CL (ref 7.350–7.450)

## 2014-07-16 LAB — GLUCOSE, CAPILLARY
GLUCOSE-CAPILLARY: 117 mg/dL — AB (ref 65–99)
Glucose-Capillary: 98 mg/dL (ref 65–99)

## 2014-07-16 LAB — BASIC METABOLIC PANEL
ANION GAP: 8 (ref 5–15)
BUN: 45 mg/dL — AB (ref 6–20)
CALCIUM: 9.2 mg/dL (ref 8.9–10.3)
CHLORIDE: 99 mmol/L — AB (ref 101–111)
CO2: 27 mmol/L (ref 22–32)
Creatinine, Ser: 1.53 mg/dL — ABNORMAL HIGH (ref 0.61–1.24)
GFR calc Af Amer: 45 mL/min — ABNORMAL LOW (ref >60)
GFR calc non Af Amer: 39 mL/min — ABNORMAL LOW (ref >60)
Glucose, Bld: 117 mg/dL — ABNORMAL HIGH (ref 65–99)
POTASSIUM: 4.3 mmol/L (ref 3.5–5.1)
SODIUM: 134 mmol/L — AB (ref 135–145)

## 2014-07-16 MED ORDER — POLYETHYLENE GLYCOL 3350 17 G PO PACK
17.0000 g | PACK | Freq: Every day | ORAL | Status: DC
  Administered 2014-07-16 – 2014-07-19 (×3): 17 g via ORAL
  Filled 2014-07-16 (×4): qty 1

## 2014-07-16 MED ORDER — FUROSEMIDE 10 MG/ML IJ SOLN
40.0000 mg | Freq: Every day | INTRAMUSCULAR | Status: DC
  Administered 2014-07-16: 40 mg via INTRAVENOUS
  Filled 2014-07-16 (×2): qty 4

## 2014-07-16 MED ORDER — CETYLPYRIDINIUM CHLORIDE 0.05 % MT LIQD
7.0000 mL | Freq: Two times a day (BID) | OROMUCOSAL | Status: DC
  Administered 2014-07-16 – 2014-07-19 (×6): 7 mL via OROMUCOSAL

## 2014-07-16 NOTE — Telephone Encounter (Signed)
Spoke with wife and per wife Dr Annamaria Boots was supposed to be contacting  Dr. Mare Ferrari regarding patient and his blood thinner this past weekend Wife knows and understands thinners should not be given while patient is bleeding  Wife still waiting for results of yesterdays testing She just wanted for  Dr. Mare Ferrari to be aware patient was in the hospital and agreed with what was being done. Advised that  Dr. Mare Ferrari aware of hospitalization

## 2014-07-16 NOTE — Progress Notes (Signed)
Patient's daughter insisted on staying the night.  I spoke with her about her father's current condition, and made it clear to her that this was an exception to the visitation policy, and that other nurses may not make the same exception.  She verbalized understanding, but vocalized her concern and disagreement about our unit's visiting policy.

## 2014-07-16 NOTE — Telephone Encounter (Signed)
Thanks for uptake.  Will follow-up.

## 2014-07-16 NOTE — Care Management Note (Signed)
Case Management Note  Patient Details  Name: Craig [name]. MRN: [id number] Date of Birth: January 24, 1925  Subjective/Objective:                  Lives at home with wife.  Uses walker with wife using gait belt to assist with standing and tx to w/c.  Has [facility] at home for PT/OT and RN.  RN was needed for blood draws prior to him being able to go to office.  Will need resumption of these services on discharge.   Action/Plan: CM will continue to follow for further needs.   Expected Discharge Date:                  Expected Discharge Plan:  [place]  In-House Referral:     Discharge planning Services  CM Consult  Post Acute Care Choice:    Choice offered to:     DME Arranged:    DME Agency:  [place]:    [place]:     Status of Service:     Medicare Important Message Given:  Yes Date Medicare IM Given:  07/12/14 Medicare IM give by:  Jacqlyn Krauss, RN. BSN Date Additional Medicare IM Given:  07/16/14 Additional Medicare Important Message give by:  Luz Lex, RNBSN   If discussed at Long Length of Stay Meetings, dates discussed:    Additional Comments:  Vergie Living, RN 07/16/2014, 3:26 PM

## 2014-07-16 NOTE — Telephone Encounter (Signed)
New Message      Pt's daughter Ray Church calling stating that they want to make Dr. Mare Ferrari aware that pt is in the ICU at [facility] and has been off all blood thinners since Saturday morning, pt is in the hospital b/c of hemoptysis. Please call back and advise.

## 2014-07-16 NOTE — Progress Notes (Signed)
Name: Schawn Byas. 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  Patient reports he had a bronchitis episode and was coughing for 1 wk then on the day of presentation to the hospital had an episode where he coughed up a cup of blood.  That resolved spontaneously and has not occurred since.  Patient is on coumadin for a-fib.  Now on heparin drip.  CTA showed a "chronic" PE.  Also of note, prostate cancer with mets to the spine on lupron.  SIGNIFICANT EVENTS  5/19 hemoptysis 5/23 to ICU with progressive hemoptysis 5/24- bronch with There were bloody secretions from airways. There was also mass like lesion eminating from lingular and Lt lower lobe orifices. Endobronchial biopsy from both locations done  STUDIES:  5/19 CTA with a "chronic" PE 5/23 path bronch bx>>>  SUBJECTIVE:  No hemoptysis  VITAL SIGNS: Temp:  [98.1 F (36.7 C)-99.9 F (37.7 C)] 98.1 F (36.7 C) (05/24 1222) Pulse Rate:  [47-137] 132 (05/24 0900) Resp:  [16-27] 22 (05/24 0900) BP: (90-182)/(49-122) 101/77 mmHg (05/24 0900) SpO2:  [98 %-100 %] 100 % (05/24 0900)  PHYSICAL EXAMINATION: General: ill appearing, no distress Neuro: follows commands HEENT: no sinus tenderness, no oral exudate Cardiovascular: regular s1 s 2 rr Lungs: scattered crackles left Abdomen:  Soft, non tender Musculoskeletal: no edema Skin: no rashes  CBC Recent Labs     07/14/14  0524  07/15/14  0310  07/16/14  0220  WBC  7.7  8.5  11.1*  HGB  10.8*  10.2*  9.9*  HCT  32.9*  31.8*  31.1*  PLT  150  163  157    Coag's No results for input(s): APTT, INR in the last 72 hours.  BMET Recent Labs     07/16/14  0220  NA  134*  K  4.3  CL  99*  CO2  27    BUN  45*  CREATININE  1.53*  GLUCOSE  117*    Electrolytes Recent Labs     07/16/14  0220  CALCIUM  9.2    Sepsis Markers Recent Labs     07/15/14  0913  07/16/14  0220  PROCALCITON  <0.10  0.29    ABG Recent Labs     07/15/14  2257  PHART  7.060*  PCO2ART  30.7*  PO2ART  61.0*    Liver Enzymes No results for input(s): AST, ALT, ALKPHOS, BILITOT, ALBUMIN in the last 72 hours.  Cardiac Enzymes No results for input(s): TROPONINI, PROBNP in the last 72 hours.  Glucose Recent Labs     07/15/14  0947  GLUCAP  118*    Imaging Dg Hand 2 View Right  07/15/2014   CLINICAL DATA:  Medial hand pain since yesterday. No known injury. Initial encounter.  EXAM: RIGHT HAND - 2 VIEW  COMPARISON:  None.  FINDINGS: The mineralization and alignment are satisfactory. There is no evidence of acute fracture, dislocation or bone destruction. Mild interphalangeal degenerative changes are present. There are also mild degenerative changes within the radial aspect of the wrist. The soft tissues surrounding the wrist appear mildly prominent, potentially swollen.  IMPRESSION: Osteoarthritic changes as described with possible soft tissue swelling around the wrist. No acute osseous  findings.   Electronically Signed   By: Richardean Sale M.D.   On: 07/15/2014 17:35   Dg Chest Port 1 View  07/16/2014   CLINICAL DATA:  Hemoptysis.  Shortness of breath.  EXAM: PORTABLE CHEST - 1 VIEW  COMPARISON:  07/15/2014.  FINDINGS: Mediastinum and hilar structures are normal. Cardiomegaly. Developing bilateral basilar pulmonary interstitial prominence suggesting mild congestive heart failure. Pneumonitis cannot be excluded. Low lung volumes with mild basilar atelectasis. Small left pleural effusion cannot be excluded. No pneumothorax.  IMPRESSION: 1. Congestive heart failure with mild bilateral from interstitial edema. Pneumonitis cannot be excluded. Small left pleural effusion .  2.  Lung volumes with mild basilar  atelectasis.   Electronically Signed   By: [name]  Register   On: 07/16/2014 07:06   Dg Chest Port 1 View  07/15/2014   CLINICAL DATA:  Hemoptysis  EXAM: PORTABLE CHEST - 1 VIEW  COMPARISON:  07/11/2014  FINDINGS: Cardiomegaly. No acute infiltrate or pulmonary edema. Mild left basilar atelectasis. Atherosclerotic calcifications of thoracic aorta. Degenerative changes thoracic spine.  IMPRESSION: Cardiomegaly. Left base atelectasis. Atherosclerotic calcifications of thoracic aorta.   Electronically Signed   By: Lahoma Crocker M.D.   On: 07/15/2014 09:32      ASSESSMENT / PLAN:  PULMONARY A: Hemoptysis. Endobronchial mass like lesion Lingula LLL Chronic PE Edema pcxr P: pcxr reviewed Keep even to neg IS Follow path ABg noted, error?  CARDIAC A: Hx of chronic PE. 2nd pulmonary HTN. Hx of HTN, HLD, A fib. P: Hold heparin in setting of hemoptysis Monitor hemodynamics Consider lasix  Norvasc, lipitor, zetia, lasix, cozaar, lovaza - hold as sys 130  RENAL A: Diastolic dz P: Monitor renal fx, urine outpt flomax Lasix dependent Change to IV  GASTROENTEROLOGY A: Nutrition. P: Diet back  HEMATOLOGY A: Hx of metastatic prostate cancer. P: Enzalutamide scd  INFECTION A: Possible CAP unimpressed P: Rocephin, will dc this in am if remains culture neg from BAL Dc zithromax   ENDOCRINE A: No acute issues. P: Monitor blood sugar  NEUROLOGY A: No acute issues. P: Monitor mental status  Summary: Will transfer to med floor  Updated pt's wife at bedside.  Lavon Paganini. Titus Mould, MD, [place] Pgr: [facility]

## 2014-07-16 NOTE — Progress Notes (Signed)
Stopped by for social call. Appreciate the good care.  If no further bleeding I would favor restarting his warfarin for his atrial fibrillation. Spoke with his daughter Earnest Bailey also in the room last night. She states he has not been having any recent falls which would make anticoagulation more problematic.

## 2014-07-16 NOTE — Telephone Encounter (Signed)
Left message to call back  

## 2014-07-17 ENCOUNTER — Telehealth: Payer: Self-pay | Admitting: Oncology

## 2014-07-17 ENCOUNTER — Inpatient Hospital Stay (HOSPITAL_COMMUNITY): Payer: Medicare Other

## 2014-07-17 LAB — CBC WITH DIFFERENTIAL/PLATELET
Basophils Absolute: 0 10*3/uL (ref 0.0–0.1)
Basophils Relative: 0 % (ref 0–1)
Eosinophils Absolute: 0.4 10*3/uL (ref 0.0–0.7)
Eosinophils Relative: 5 % (ref 0–5)
HCT: 27.5 % — ABNORMAL LOW (ref 39.0–52.0)
Hemoglobin: 9 g/dL — ABNORMAL LOW (ref 13.0–17.0)
LYMPHS ABS: 1.1 10*3/uL (ref 0.7–4.0)
Lymphocytes Relative: 13 % (ref 12–46)
MCH: 31.5 pg (ref 26.0–34.0)
MCHC: 32.7 g/dL (ref 30.0–36.0)
MCV: 96.2 fL (ref 78.0–100.0)
MONO ABS: 1.3 10*3/uL — AB (ref 0.1–1.0)
MONOS PCT: 15 % — AB (ref 3–12)
Neutro Abs: 5.6 10*3/uL (ref 1.7–7.7)
Neutrophils Relative %: 67 % (ref 43–77)
PLATELETS: 162 10*3/uL (ref 150–400)
RBC: 2.86 MIL/uL — AB (ref 4.22–5.81)
RDW: 14.5 % (ref 11.5–15.5)
WBC: 8.4 10*3/uL (ref 4.0–10.5)

## 2014-07-17 LAB — COMPREHENSIVE METABOLIC PANEL
ALBUMIN: 2.3 g/dL — AB (ref 3.5–5.0)
ALT: 15 U/L — ABNORMAL LOW (ref 17–63)
ANION GAP: 9 (ref 5–15)
AST: 19 U/L (ref 15–41)
Alkaline Phosphatase: 56 U/L (ref 38–126)
BILIRUBIN TOTAL: 0.5 mg/dL (ref 0.3–1.2)
BUN: 51 mg/dL — AB (ref 6–20)
CALCIUM: 8.6 mg/dL — AB (ref 8.9–10.3)
CO2: 26 mmol/L (ref 22–32)
CREATININE: 1.73 mg/dL — AB (ref 0.61–1.24)
Chloride: 99 mmol/L — ABNORMAL LOW (ref 101–111)
GFR calc Af Amer: 39 mL/min — ABNORMAL LOW (ref >60)
GFR, EST NON AFRICAN AMERICAN: 34 mL/min — AB (ref >60)
Glucose, Bld: 82 mg/dL (ref 65–99)
POTASSIUM: 4.1 mmol/L (ref 3.5–5.1)
SODIUM: 134 mmol/L — AB (ref 135–145)
TOTAL PROTEIN: 5.5 g/dL — AB (ref 6.5–8.1)

## 2014-07-17 LAB — HEPARIN LEVEL (UNFRACTIONATED): Heparin Unfractionated: <0.1 IU/mL — ABNORMAL LOW (ref 0.30–0.70)

## 2014-07-17 LAB — GLUCOSE, CAPILLARY: GLUCOSE-CAPILLARY: 84 mg/dL (ref 65–99)

## 2014-07-17 LAB — URIC ACID: URIC ACID, SERUM: 8 mg/dL — AB (ref 4.4–7.6)

## 2014-07-17 LAB — SEDIMENTATION RATE: SED RATE: 79 mm/h — AB (ref 0–16)

## 2014-07-17 MED ORDER — HEPARIN (PORCINE) IN NACL 100-0.45 UNIT/ML-% IJ SOLN
1350.0000 [IU]/h | INTRAMUSCULAR | Status: AC
  Administered 2014-07-17: 1100 [IU]/h via INTRAVENOUS
  Administered 2014-07-18: 1350 [IU]/h via INTRAVENOUS
  Filled 2014-07-17 (×2): qty 250

## 2014-07-17 MED ORDER — PREDNISONE 20 MG PO TABS
40.0000 mg | ORAL_TABLET | Freq: Every day | ORAL | Status: DC
  Administered 2014-07-17 – 2014-07-19 (×3): 40 mg via ORAL
  Filled 2014-07-17 (×4): qty 2

## 2014-07-17 MED ORDER — FLEET ENEMA 7-19 GM/118ML RE ENEM
1.0000 | ENEMA | Freq: Once | RECTAL | Status: AC
  Administered 2014-07-17: 1 via RECTAL
  Filled 2014-07-17: qty 1

## 2014-07-17 NOTE — Progress Notes (Signed)
ANTICOAGULATION CONSULT NOTE - Follow Up Consult  Pharmacy Consult for Heparin Indication: PE and afib  Allergies  Allergen Reactions  . Pravachol     Muscle weakness  . Zocor [Simvastatin - High Dose]     Muscle weakness    Patient Measurements: Height:  (6'1") Weight: 181 lb 9.6 oz (82.373 kg) IBW/kg (Calculated) : 77.6 Heparin Dosing Weight: 83.8 kg  Vital Signs: Temp: 98 F (36.7 C) (05/25 0422) Temp Source: Oral (05/25 0422) BP: 124/50 mmHg (05/25 0422) Pulse Rate: 67 (05/25 0422)  Labs:  Recent Labs  07/15/14 0310 07/16/14 0220 07/17/14 0426  HGB 10.2* 9.9* 9.0*  HCT 31.8* 31.1* 27.5*  PLT 163 157 162  CREATININE  --  1.53* 1.73*    Estimated Creatinine Clearance: 33 mL/min (by C-G formula based on Cr of 1.73).   Assessment: 81yo male on Coumadin PTA for afib. He is noted with CT showing chronic PE and also with prostate cancer with mets. He was previously on heparin and this was stopped on 5/21 due to hemoptysis. A endoscopy was done 5/23 showing an endobronchial mass and he is s/p biopsy on 5/23. Pharmacy has been consulted to resume heparin today with plans to resume coumadin 5/26 if no further bleeding (last episode of hemoptysis was 5/23).  -Hg/hct= 9/27.5, plt= 162  Goal of Therapy:  Heparin level 0.3-0.5 units/ml Monitor platelets by anticoagulation protocol: Yes   Plan:  -Restart heparin at 1100 units/hr -Heparin level in 8 hours and daily wth CBC daily  Hildred Laser, Pharm D 07/17/2014 10:58 AM

## 2014-07-17 NOTE — Telephone Encounter (Signed)
Moved 6/1 appointments to AM due to center event. Left message for patient and mailed schedule.

## 2014-07-17 NOTE — Progress Notes (Signed)
ANTICOAGULATION CONSULT NOTE - Follow Up Consult  Pharmacy Consult for Heparin Indication: PE and afib  Allergies  Allergen Reactions  . Pravachol     Muscle weakness  . Zocor [Simvastatin - High Dose]     Muscle weakness    Patient Measurements: Height:  (6'1") Weight: 181 lb 9.6 oz (82.373 kg) IBW/kg (Calculated) : 77.6 Heparin Dosing Weight: 83.8 kg  Vital Signs: Temp: 98.1 F (36.7 C) (05/25 2010) Temp Source: Oral (05/25 2010) BP: 157/68 mmHg (05/25 2010) Pulse Rate: 77 (05/25 2010)  Labs:  Recent Labs  07/15/14 0310 07/16/14 0220 07/17/14 0426 07/17/14 2000  HGB 10.2* 9.9* 9.0*  --   HCT 31.8* 31.1* 27.5*  --   PLT 163 157 162  --   HEPARINUNFRC  --   --   --  <0.10*  CREATININE  --  1.53* 1.73*  --     Estimated Creatinine Clearance: 33 mL/min (by C-G formula based on Cr of 1.73).   Assessment: 81yo male on Coumadin PTA for afib. He is noted with CT showing chronic PE and also with prostate cancer with mets. He was previously on heparin and this was stopped on 5/21 due to hemoptysis. A endoscopy was done 5/23 showing an endobronchial mass and he is s/p biopsy on 5/23. Pharmacy has been consulted to resume heparin today with plans to resume coumadin 5/26 if no further bleeding (last episode of hemoptysis was 5/23).  -Hg/hct= 9/27.5, plt= 162  Heparin level undetectable on 1100 units/hr. Level was drawn 6 hrs after infusion was started. No interruptions with infusion, no bleeding noted per RN  Goal of Therapy:  Heparin level 0.3-0.5 units/ml Monitor platelets by anticoagulation protocol: Yes   Plan:  -Increase heparin rate to 1250 units/hr - f/u AM heparin level and CBC  Maryanna Shape, PharmD, BCPS  Clinical Pharmacist  Pager: [phone]

## 2014-07-17 NOTE — Progress Notes (Signed)
Name: Craig [name]. 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  Patient reports he had a bronchitis episode and was coughing for 1 wk then on the day of presentation to the hospital had an episode where he coughed up a cup of blood.  That resolved spontaneously and has not occurred since.  Patient is on coumadin for a-fib. CTA showed a "chronic" PE.  Also of note, prostate cancer with mets to the spine on lupron.  SIGNIFICANT EVENTS  5/19 hemoptysis 5/23 to ICU with progressive hemoptysis 5/24- bronch with There were bloody secretions from airways. There was also mass like lesion eminating from lingular and Lt lower lobe orifices. Endobronchial biopsy from both locations done  STUDIES:  5/19 CTA with a "chronic" PE 5/23 path bronch bx>>>neg  SUBJECTIVE:  No hemoptysis, feels frustrated due to his right hand pain  VITAL SIGNS: Temp:  [98 F (36.7 C)-99.5 F (37.5 C)] 98 F (36.7 C) (05/25 0422) Pulse Rate:  [51-98] 67 (05/25 0422) Resp:  [15-19] 17 (05/25 0422) BP: (105-134)/(47-76) 124/50 mmHg (05/25 0422) SpO2:  [95 %-100 %] 95 % (05/25 0422) 2 liters  PHYSICAL EXAMINATION: General: chronically ill appearing and debilitated elderly male, no distress Neuro: follows commands, oriented but perseverates  HEENT: no sinus tenderness, no oral exudate Cardiovascular: regular s1 s 2 rr Lungs: basilar rales, no accessory muscle use  Abdomen:  Soft, non tender Musculoskeletal: right knuckles erythremic and painful to touch  Skin: no rashes  CBC Recent Labs     07/15/14  0310  07/16/14  0220  07/17/14  0426  WBC  8.5  11.1*  8.4  HGB  10.2*  9.9*  9.0*  HCT  31.8*  31.1*  27.5*  PLT  163  157  162     Coag's No results for input(s): APTT, INR in the last 72 hours.  BMET Recent Labs     07/16/14  0220  07/17/14  0426  NA  134*  134*  K  4.3  4.1  CL  99*  99*  CO2  27  26  BUN  45*  51*  CREATININE  1.53*  1.73*  GLUCOSE  117*  82    Imaging Dg Hand 2 View Right  07/15/2014   CLINICAL DATA:  Medial hand pain since yesterday. No known injury. Initial encounter.  EXAM: RIGHT HAND - 2 VIEW  COMPARISON:  None.  FINDINGS: The mineralization and alignment are satisfactory. There is no evidence of acute fracture, dislocation or bone destruction. Mild interphalangeal degenerative changes are present. There are also mild degenerative changes within the radial aspect of the wrist. The soft tissues surrounding the wrist appear mildly prominent, potentially swollen.  IMPRESSION: Osteoarthritic changes as described with possible soft tissue swelling around the wrist. No acute osseous findings.   Electronically Signed   By: Richardean Sale M.D.   On: 07/15/2014 17:35   Dg Chest Port 1 View  07/17/2014   CLINICAL DATA:  Pneumonia.  EXAM: PORTABLE CHEST - 1 VIEW  COMPARISON:  07/16/2014 .  FINDINGS: Mediastinum hilar structures normal. Cardiomegaly with normal pulmonary vascularity. Mild right upper lobe infiltrate. Decreased aeration from prior exam . Bibasilar atelectasis and/or infiltrates. No pleural effusion or  pneumothorax.  IMPRESSION: 1. Mild infiltrate right upper lobe. Decreased aeration from prior exam. Bibasilar atelectasis and/or infiltrates. 2. Cardiomegaly.  No pulmonary venous congestion.   Electronically Signed   By: [name]  Register   On: 07/17/2014 07:32   Dg Chest Port 1 View  07/16/2014   CLINICAL DATA:  Hemoptysis.  Shortness of breath.  EXAM: PORTABLE CHEST - 1 VIEW  COMPARISON:  07/15/2014.  FINDINGS: Mediastinum and hilar structures are normal. Cardiomegaly. Developing bilateral basilar pulmonary interstitial prominence suggesting mild congestive heart failure. Pneumonitis  cannot be excluded. Low lung volumes with mild basilar atelectasis. Small left pleural effusion cannot be excluded. No pneumothorax.  IMPRESSION: 1. Congestive heart failure with mild bilateral from interstitial edema. Pneumonitis cannot be excluded. Small left pleural effusion .  2.  Lung volumes with mild basilar atelectasis.   Electronically Signed   By: [place]   On: 07/16/2014 07:06   Lower volume film. No sig change    ASSESSMENT / PLAN:  Hemoptysis-->resolved Endobronchial mass like lesion Lingula LLL-->negative path  Chronic PE Wonder if all these finding were more from PNA??  Plan : Keep even to neg IS Will resume heparin today (5/25) and coumadin 5/26  2nd pulmonary HTN. Hx of HTN, HLD, A fib. Plan: Hold heparin in setting of hemoptysis Monitor hemodynamics Norvasc, lipitor, zetia, lasix, cozaar, lovaza. Start heparin w/out bolus, monitor, if no issues can start coumadin 5/26 if tolerates heparin X 24 hrs w/out bleeding  Change lasix back to 40 po daily on 5/26, hold for 5/25 as BUN/creat rising  Acute on chronic renal failure in setting of diuresis Plan D/c lasix 5/25, f/u chemistry 5/26  Probable Gout flare; right hand Plan Stop lasix  pred taper   Constipation Plan Fleets enema  Hx of metastatic prostate cancer. Plan: Enzalutamide scd  Possible CAP (NOS); received 3 days azithro.  Plan: Rocephin day 5/7  Erick Colace ACNP-BC [place] Pager # [phone] OR # [phone] if no answer    Reviewed above, examined.  Breathing better.  No further episode of hemoptysis.  Denies chest pain.  C/o constipation and Rt wrist swelling/pain.  Also feels weak.  Heart rate regular, chest with decreased BS LT > RT base, Rt wrist swollen but no redness, abdomen soft with bowel sounds.  Endobronchial bx shows acute on chronic inflammation, but negative for malignancy.  Will continue abx.  Add prednisone >> should help with gout flare  and inflammation in airway.  Continue bronchial hygiene.  Resume heparin gtt and monitor for recurrence of hemoptysis.  Hold lasix and monitor renal fx.  Will adjust bowel regimen.  F/u with PT >> ?if he will need inpatient rehab.  Updated pt's wife and daughter at bedside.  Will ask [facility] to assume primary care from 5/26.  PCCM will continue to follow as pulmonary consult.  Chesley Mires, MD [place] 07/17/2014, 10:56 AM Pager:  [phone] After 3pm call: [phone]

## 2014-07-17 NOTE — Evaluation (Signed)
Physical Therapy Evaluation Patient Details Name: Craig [name]. MRN: [id number] DOB: 16-May-1924 Today's Date: 07/17/2014   History of Present Illness  Adm with hemoptysis. On anticoagulation due to afib. Switched to heparin. Bronchoscopy performed with biopsy of mass (results benign). Severe Rt wrist pain, significant Lt knee pain developed "overnight Sunday" per pt/wife. PMHx- prostate CA with mets to spine and Rt femur; Rt cerebellar CVA; afib    Clinical Impression  Pt admitted with above diagnosis. Mobility at baseline is 1 person stand-pivot with 2 wheeled RW. Currently pt severely limited in mobility due to severe Rt wrist pain (?gout per NP) and generalized weakness from inactivity. Pt currently with functional limitations due to the deficits listed below (see PT Problem List).  Pt will benefit from skilled PT to increase their independence and safety with mobility to allow discharge to the venue listed below.       Follow Up Recommendations Supervision/Assistance - 24 hour;Home health PT (if wrist and transfers improve; if not may need CIR)    Equipment Recommendations  None recommended by PT    Recommendations for Other Services       Precautions / Restrictions Precautions Precautions: Fall Restrictions Weight Bearing Restrictions: No      Mobility  Bed Mobility Overal bed mobility: Needs Assistance Bed Mobility: Supine to Sit     Supine to sit: Mod assist;HOB elevated     General bed mobility comments: pt exits to Rt at home with hospital bed, however currently unable to use Rt wrist due to pain; education on technique to use head and lt shoulder to "reach across" and then props on Rt elbow to push up  Transfers Overall transfer level: Needs assistance Equipment used: None Transfers: Stand Pivot Transfers   Stand pivot transfers: Max assist;From elevated surface (+3 with PT, wife, daughter)       General transfer comment: pt very anxious re: falling  and resisting/freezing when moving; attempted pivot to his right, however pt pushing//leaning to his left and switched BSC to his left; had pt reach across [facility] to far armrest and attempted transfer x5 before pt was over his feet enough to take pivotal steps to BSC (strong posterior lean)  Ambulation/Gait                Stairs            Wheelchair Mobility    Modified Rankin (Stroke Patients Only)       Balance Overall balance assessment: Needs assistance;History of Falls Sitting-balance support: Single extremity supported;Feet supported Sitting balance-Leahy Scale: Poor Sitting balance - Comments: initial posterior lean and min assist; progressed to close guard                                     Pertinent Vitals/Pain Pain Assessment: Faces Faces Pain Scale: Hurts even more Pain Location: Rt wrist > Lt knee Pain Descriptors / Indicators: Sharp Pain Intervention(s): Limited activity within patient's tolerance;Monitored during session;Patient requesting pain meds-RN notified    Home Living Family/patient expects to be discharged to:: Private residence Living Arrangements: Spouse/significant other Available Help at Discharge: Family;Personal care attendant;Other (Comment) (uses aids up to 5x/wk for when wife needs to be away) Type of Home: House Home Access: Level entry     Home Layout: Two level Home Equipment: Shower seat - built in;Walker - 2 wheels;Grab bars - tub/shower;Walker - 4 wheels;Shower seat;Hand held shower head;Bedside  commode;Tub bench;Hospital bed;Wheelchair - manual (BSC over toilet; w/c cushion; lift chair) Additional Comments: also has personal trainer coming 2x/wk    Prior Function Level of Independence: Needs assistance   Gait / Transfers Assistance Needed: stand pivot with RW to w/c (pt starts with hands on RW as wife anchors it and uses gait belt to assist him up), w/c to toilet  ADL's / Homemaking Assistance Needed: Wife  assists with all ADL's - per wife he is mostly dependent for bathing and dressing.         Hand Dominance   Dominant Hand: Right    Extremity/Trunk Assessment   Upper Extremity Assessment: RUE deficits/detail (LUE grossly WFL) RUE Deficits / Details: very limited AROM Rt wrist with pain--wife feels it is arthritis; pt unable to tolerate PROM         Lower Extremity Assessment: LLE deficits/detail;RLE deficits/detail RLE Deficits / Details: strength grossly 4/5 LLE Deficits / Details: limited by knee pain; grossly  3+  Cervical / Trunk Assessment: Kyphotic  Communication   Communication: HOH  Cognition Arousal/Alertness: Awake/alert Behavior During Therapy: Anxious (fear of falling) Overall Cognitive Status: History of cognitive impairments - at baseline       Memory: Decreased short-term memory              General Comments General comments (skin integrity, edema, etc.): Wife and daughter very insistent pt get onto [facility] to try to have BM    Exercises        Assessment/Plan    PT Assessment Patient needs continued PT services  PT Diagnosis Generalized weakness;Acute pain   PT Problem List Decreased strength;Decreased activity tolerance;Decreased balance;Decreased mobility;Decreased knowledge of use of DME;Cardiopulmonary status limiting activity;Pain  PT Treatment Interventions DME instruction;Functional mobility training;Therapeutic activities;Therapeutic exercise;Balance training;Cognitive remediation;Patient/family education   PT Goals (Current goals can be found in the Care Plan section) Acute Rehab PT Goals Patient Stated Goal: return home PT Goal Formulation: With patient/family Time For Goal Achievement: 07/24/14 Potential to Achieve Goals: Good    Frequency Min 3X/week   Barriers to discharge        Co-evaluation               End of Session Equipment Utilized During Treatment: Gait belt;Oxygen Activity Tolerance: Patient limited by  pain Patient left: in bed;with call bell/phone within reach;with family/visitor present;with nursing/sitter in room Nurse Communication: Mobility status (try sara-plus)         Time: 4650-3546 (time adjusted for non-therapeutic time onBSC) PT Time Calculation (min) (ACUTE ONLY): 60 min   Charges:   PT Evaluation $Initial PT Evaluation Tier I: 1 Procedure     PT G Codes:        Craig [name] 23-Jul-2014, 10:38 AM Pager [phone]

## 2014-07-18 DIAGNOSIS — I10 Essential (primary) hypertension: Secondary | ICD-10-CM

## 2014-07-18 LAB — COMPREHENSIVE METABOLIC PANEL
ALK PHOS: 72 U/L (ref 38–126)
ALT: 26 U/L (ref 17–63)
AST: 29 U/L (ref 15–41)
Albumin: 2.3 g/dL — ABNORMAL LOW (ref 3.5–5.0)
Anion gap: 7 (ref 5–15)
BUN: 59 mg/dL — ABNORMAL HIGH (ref 6–20)
CHLORIDE: 102 mmol/L (ref 101–111)
CO2: 27 mmol/L (ref 22–32)
CREATININE: 1.8 mg/dL — AB (ref 0.61–1.24)
Calcium: 8.6 mg/dL — ABNORMAL LOW (ref 8.9–10.3)
GFR calc Af Amer: 37 mL/min — ABNORMAL LOW (ref >60)
GFR, EST NON AFRICAN AMERICAN: 32 mL/min — AB (ref >60)
Glucose, Bld: 128 mg/dL — ABNORMAL HIGH (ref 65–99)
Potassium: 4.5 mmol/L (ref 3.5–5.1)
Sodium: 136 mmol/L (ref 135–145)
TOTAL PROTEIN: 5.5 g/dL — AB (ref 6.5–8.1)
Total Bilirubin: 0.6 mg/dL (ref 0.3–1.2)

## 2014-07-18 LAB — URINE CULTURE

## 2014-07-18 LAB — CBC
HEMATOCRIT: 27.4 % — AB (ref 39.0–52.0)
Hemoglobin: 9.3 g/dL — ABNORMAL LOW (ref 13.0–17.0)
MCH: 31.8 pg (ref 26.0–34.0)
MCHC: 33.9 g/dL (ref 30.0–36.0)
MCV: 93.8 fL (ref 78.0–100.0)
Platelets: 168 10*3/uL (ref 150–400)
RBC: 2.92 MIL/uL — ABNORMAL LOW (ref 4.22–5.81)
RDW: 13.9 % (ref 11.5–15.5)
WBC: 6.4 10*3/uL (ref 4.0–10.5)

## 2014-07-18 LAB — HEPARIN LEVEL (UNFRACTIONATED): Heparin Unfractionated: 0.3 IU/mL (ref 0.30–0.70)

## 2014-07-18 LAB — CULTURE, BLOOD (ROUTINE X 2): CULTURE: NO GROWTH

## 2014-07-18 LAB — PROTIME-INR
INR: 1.29 (ref 0.00–1.49)
Prothrombin Time: 16.2 seconds — ABNORMAL HIGH (ref 11.6–15.2)

## 2014-07-18 MED ORDER — WARFARIN SODIUM 5 MG PO TABS
5.0000 mg | ORAL_TABLET | Freq: Once | ORAL | Status: AC
  Administered 2014-07-18: 5 mg via ORAL
  Filled 2014-07-18: qty 1

## 2014-07-18 MED ORDER — LEVOFLOXACIN 250 MG PO TABS: 250.0000 mg | ORAL_TABLET | ORAL | Status: DC

## 2014-07-18 MED ORDER — LEVOFLOXACIN 500 MG PO TABS
500.0000 mg | ORAL_TABLET | Freq: Once | ORAL | Status: AC
  Administered 2014-07-18: 500 mg via ORAL
  Filled 2014-07-18: qty 1

## 2014-07-18 MED ORDER — ENOXAPARIN SODIUM 80 MG/0.8ML ~~LOC~~ SOLN
80.0000 mg | SUBCUTANEOUS | Status: DC
  Administered 2014-07-18: 80 mg via SUBCUTANEOUS
  Filled 2014-07-18 (×2): qty 0.8

## 2014-07-18 MED ORDER — SODIUM CHLORIDE 0.9 % IV SOLN
INTRAVENOUS | Status: AC
  Administered 2014-07-18: 11:00:00 via INTRAVENOUS

## 2014-07-18 MED ORDER — WARFARIN - PHARMACIST DOSING INPATIENT
Freq: Every day | Status: DC
  Administered 2014-07-18: 18:00:00

## 2014-07-18 NOTE — Progress Notes (Signed)
Physical Therapy Treatment Patient Details Name: Craig [name]. MRN: [id number] DOB: 1924-07-26 Today's Date: 07/18/2014    History of Present Illness Adm with hemoptysis. On anticoagulation due to afib. Switched to heparin. Bronchoscopy performed with biopsy of mass (results pending). PMHx- prostate CA with mets to spine and Rt femur; Rt cerebellar CVA; afib    PT Comments    Pt less anxious with use of platform RW (although he was able to grip handle on platform with Rt hand and did little weight bearing through his Rt elbow--do not feel he will need a platform for his RW at home). Able to transfer and walk short distance with 2 person assist. Wife hopeful to d/c home tomorrow. They have all necessary equipment.    Follow Up Recommendations  Supervision/Assistance - 24 hour;Home health PT (including personal aides as PTA)     Equipment Recommendations  None recommended by PT    Recommendations for Other Services       Precautions / Restrictions Precautions Precautions: Fall    Mobility  Bed Mobility Overal bed mobility: Needs Assistance;+2 for physical assistance Bed Mobility: Sit to Sidelying;Rolling Rolling: Supervision       Sit to sidelying: Min assist;+2 for physical assistance General bed mobility comments: vc for sit to sidelying to minimize readjustments once in the bed; 2 person assist due to pt's fear vs decr understanding of instructions  Transfers Overall transfer level: Needs assistance Equipment used: Right platform walker Transfers: Sit to/from Stand Sit to Stand: Max assist;+2 physical assistance Stand pivot transfers:  (+3 with PT, wife, daughter)       General transfer comment: mod assist to scoot out to edge of chair and place feet appropriately; limited ability to push off armrest from recliner and poor anterior weight shift; pt with difficulty placing RUE on platform (unfamiliar)  Ambulation/Gait Ambulation/Gait assistance: Mod assist;+2  physical assistance;+2 safety/equipment Ambulation Distance (Feet): 5 Feet Assistive device: Right platform walker Gait Pattern/deviations: Step-to pattern;Decreased stride length;Decreased weight shift to left;Shuffle;Leaning posteriorly;Drifts right/left;Wide base of support     General Gait Details: posterior lean and drifts to the right   Stairs            Wheelchair Mobility    Modified Rankin (Stroke Patients Only)       Balance Overall balance assessment: Needs assistance         Standing balance support: Bilateral upper extremity supported Standing balance-Leahy Scale: Zero                      Cognition Arousal/Alertness: Awake/alert Behavior During Therapy: Anxious (less today) Overall Cognitive Status: History of cognitive impairments - at baseline       Memory: Decreased short-term memory              Exercises General Exercises - Lower Extremity Ankle Circles/Pumps: AROM;Both;10 reps Long Arc Quad: Both;10 reps    General Comments        Pertinent Vitals/Pain Pain Assessment: Faces Faces Pain Scale: Hurts little more Pain Location: Rt wrist Pain Descriptors / Indicators: Sharp Pain Intervention(s): Limited activity within patient's tolerance;Monitored during session    Home Living                      Prior Function            PT Goals (current goals can now be found in the care plan section) Acute Rehab PT Goals Patient Stated Goal: return home PT  Goal Formulation: With patient/family Time For Goal Achievement: 07/24/14 Potential to Achieve Goals: Good Progress towards PT goals: Progressing toward goals    Frequency  Min 3X/week    PT Plan Current plan remains appropriate    Co-evaluation             End of Session Equipment Utilized During Treatment: Gait belt Activity Tolerance: Patient limited by fatigue Patient left: in bed;with call bell/phone within reach;with family/visitor present      Time: 1916-6060 PT Time Calculation (min) (ACUTE ONLY): 21 min  Charges:  $Gait Training: 8-22 mins                    G Codes:      Craig [name] 2014/07/28, 2:35 PM Pager [phone]

## 2014-07-18 NOTE — Progress Notes (Addendum)
ANTICOAGULATION CONSULT NOTE - Follow Up Consult  Pharmacy Consult for Heparin Indication: PE and afib  Allergies  Allergen Reactions  . Pravachol     Muscle weakness  . Zocor [Simvastatin - High Dose]     Muscle weakness    Patient Measurements: Height:  (6'1") Weight: 181 lb 9.6 oz (82.373 kg) IBW/kg (Calculated) : 77.6 Heparin Dosing Weight: 83.8 kg  Vital Signs: Temp: 97.7 F (36.5 C) (05/26 0419) Temp Source: Oral (05/26 0419) BP: 136/65 mmHg (05/26 0419) Pulse Rate: 70 (05/26 0419)  Labs:  Recent Labs  07/16/14 0220 07/17/14 0426 07/17/14 2000 07/18/14 0412 07/18/14 0640  HGB 9.9* 9.0*  --  9.3*  --   HCT 31.1* 27.5*  --  27.4*  --   PLT 157 162  --  168  --   LABPROT  --   --   --  16.2*  --   INR  --   --   --  1.29  --   HEPARINUNFRC  --   --  <0.10*  --  0.30  CREATININE 1.53* 1.73*  --  1.80*  --     Estimated Creatinine Clearance: 31.7 mL/min (by C-G formula based on Cr of 1.8).   Assessment: 81yo male on Coumadin PTA for afib. He is noted with CT showing chronic PE and also with prostate cancer with mets. He was previously on heparin and this was stopped on 5/21 due to hemoptysis. A endoscopy was done 5/23 showing an endobronchial mass and he is s/p biopsy on 5/23.Heparin has been resumed with plans for coumadin 5/26 if no further bleeding (last episode of hemoptysis was 5/23).   -Heparin level = 0.3 after increase to 1250 units/hr, Hg/hct= 9.3/27.4, plt= 168  Goal of Therapy:  Heparin level 0.3-0.5 units/ml Monitor platelets by anticoagulation protocol: Yes   Plan:  -Increase heparin rate to 1350 units/hr to keep in range -Heparin level in 8 hours and daily wth CBC daily  Hildred Laser, Pharm D 07/18/2014 8:38 AM  Addendum:  -pharmacy to restart coumadin today -Hg= 9.3 and stable; INR 1.29 on 5/26  Plan -Coumadin 5mg  po today -Daily PT/INR  Hildred Laser, Pharm D 07/18/2014 10:51 AM  Addendum -Pharmacy has been consulted to transition  to lovenox -SCr= 1.8 and trend up, CrCl ~ 30  Plan -Discontinue heparin -Begin lovenox 80mg  sq q24hr 1 hours after the infusion is discontinued (will begin lovenox at 6pm to create an easier dosing schedule if patient is discharged soon) -will follow renal function  Hildred Laser, Pharm D 07/18/2014 11:54 AM

## 2014-07-18 NOTE — Care Management (Signed)
UR Completed. Kileigh Ortmann, RN, BSN.  [phone] 

## 2014-07-18 NOTE — Progress Notes (Signed)
Patient and wife are currently refusing SNF placement and would prefer to take patient home with home health services.  CSW informed RNCM of pt wishes to return home- patient currently working with [facility].  CSW signing off- please reconsult if needed.  Domenica Reamer, [place] Social Worker [phone]

## 2014-07-18 NOTE — Progress Notes (Signed)
07/18/2014 1:03 PM Cardiac monitoring D/C'D per MD order.  CCMD notified. [name]

## 2014-07-18 NOTE — Progress Notes (Signed)
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 Patient reports he had a bronchitis episode and was coughing for 1 wk then on the day of presentation to the hospital had an episode where he coughed up a cup of blood. That resolved spontaneously and has not occurred since. Patient is on coumadin for a-fib. CTA showed a "chronic" PE. Also of note, prostate cancer with mets to the spine on lupron.   Assessment/Plan: Hemoptysis due to the chronic PEs: Started empirically on heparin and Coumadin, INR subtherapeutic. Due to his GFR being borderline. Will recommend Coumadin. Continue Coumadin and heparin per pharmacy, will transition to Lovenox. No further hemoptysis.  Pulmonary hypertension/atrial fibrillation: He has remained hemodynamically stable, continue Norvasc, Lipitor, sodium, Lasix, lovaza. - hold ARB due to worsening renal function  Acute on chronic renal disease in the setting of overdiuresis: I agree with the scene Lasix is currently continues to trend of old start him on gentle IV fluid hydration.  Probable gout flare of the right hand: Lasix. Currently on steroids.  Constipation: - now resolved.  History of metastatic prostate cancer: Continue current regimen no changes were made.  Possible community-acquired pneumonia: We'll DC Rocephin 08/23/1999 orally.  Hypocalcemia Corrected for albumin normal.    Code Status: full Family Communication: wife at bedside  Disposition Plan: home in am   Consultants:  PCCM  Procedures: /19 hemoptysis 5/23 to ICU with progressive hemoptysis 5/24- bronch with There were bloody secretions from airways. There was also mass like lesion eminating from lingular and Lt lower lobe orifices. Endobronchial biopsy from both locations done  Antibiotics: levaquin  HPI/Subjective: No further hemoptysis  he relates his shortness of breath is improved.  Objective: Filed Vitals:   07/17/14 1442 07/17/14 2010 07/18/14 0419 07/18/14 1105  BP: 127/56 157/68 136/65 163/67  Pulse: 65 77 70   Temp: 98.1 F (36.7 C) 98.1 F (36.7 C) 97.7 F (36.5 C)   TempSrc: Oral Oral Oral   Resp: 18 16 17    Height:      Weight:      SpO2: 99% 98% 96%     Intake/Output Summary (Last 24 hours) at 07/18/14 1111 Last data filed at 07/18/14 0730  Gross per 24 hour  Intake    480 ml  Output    350 ml  Net    130 ml   Filed Weights   07/12/14 0100 07/13/14 0500 07/14/14 0450  Weight: 83.8 kg (184 lb 11.9 oz) 82.691 kg (182 lb 4.8 oz) 82.373 kg (181 lb 9.6 oz)    Exam:  General: Alert, awake, oriented x3, in no acute distress.  HEENT: No bruits, no goiter.  Heart: Regular rate and rhythm. Lungs: Good air movement, clear Abdomen: Soft, nontender, nondistended, positive bowel sounds.  Neuro: Grossly intact, nonfocal.   Data Reviewed: Basic Metabolic Panel:  Recent Labs Lab 07/11/14 2027 07/12/14 0312 07/16/14 0220 07/17/14 0426 07/18/14 0412  NA 142 139 134* 134* 136  K 4.2 4.0 4.3 4.1 4.5  CL 103 105 99* 99* 102  CO2 29 27 27 26 27   GLUCOSE 118* 96 117* 82 128*  BUN 30* 30* 45* 51* 59*  CREATININE 1.24 1.24 1.53* 1.73* 1.80*  CALCIUM 10.4* 9.9 9.2 8.6* 8.6*   Liver Function Tests:  Recent Labs Lab 07/11/14 2027 07/12/14 0312 07/17/14 0426 07/18/14 0412  AST 17 15 19 29   ALT 13* 12* 15*  26  ALKPHOS 64 56 56 72  BILITOT 0.6 0.7 0.5 0.6  PROT 6.9 6.4* 5.5* 5.5*  ALBUMIN 3.1* 2.9* 2.3* 2.3*   No results for input(s): LIPASE, AMYLASE in the last 168 hours. No results for input(s): AMMONIA in the last 168 hours. CBC:  Recent Labs Lab 07/11/14 2027 07/12/14 0312  07/14/14 0524 07/15/14 0310 07/16/14 0220 07/17/14 0426 07/18/14 0412  WBC 5.9 6.6  < > 7.7 8.5 11.1* 8.4 6.4  NEUTROABS 2.9 4.1  --   --   --   --  5.6  --   HGB 13.2 12.1*  < > 10.8* 10.2* 9.9* 9.0* 9.3*    HCT 41.2 37.0*  < > 32.9* 31.8* 31.1* 27.5* 27.4*  MCV 95.8 95.9  < > 95.9 95.2 96.0 96.2 93.8  PLT 182 168  < > 150 163 157 162 168  < > = values in this interval not displayed. Cardiac Enzymes:  Recent Labs Lab 07/11/14 2027  TROPONINI <0.03   BNP (last 3 results) No results for input(s): BNP in the last 8760 hours.  ProBNP (last 3 results)  Recent Labs  02/08/14 1148  PROBNP 6362.0*    CBG:  Recent Labs Lab 07/15/14 0947 07/16/14 1629 07/16/14 2127 07/17/14 0618  GLUCAP 118* 117* 98 84    Recent Results (from the past 240 hour(s))  Culture, blood (routine x 2)     Status: None   Collection Time: 07/11/14  8:37 PM  Result Value Ref Range Status   Specimen Description BLOOD RIGHT FOREARM  Final   Special Requests BOTTLES DRAWN AEROBIC AND ANAEROBIC 5CC  Final   Culture   Final    NO GROWTH 5 DAYS Performed at [facility]    Report Status 07/18/2014 FINAL  Final  MRSA PCR Screening     Status: None   Collection Time: 07/12/14  2:22 AM  Result Value Ref Range Status   MRSA by PCR NEGATIVE NEGATIVE Final    Comment:        The GeneXpert MRSA Assay (FDA approved for NASAL specimens only), is one component of a comprehensive MRSA colonization surveillance program. It is not intended to diagnose MRSA infection nor to guide or monitor treatment for MRSA infections.   MRSA PCR Screening     Status: None   Collection Time: 07/15/14 10:15 AM  Result Value Ref Range Status   MRSA by PCR NEGATIVE NEGATIVE Final    Comment:        The GeneXpert MRSA Assay (FDA approved for NASAL specimens only), is one component of a comprehensive MRSA colonization surveillance program. It is not intended to diagnose MRSA infection nor to guide or monitor treatment for MRSA infections.   Culture, Urine     Status: None (Preliminary result)   Collection Time: 07/15/14  2:15 PM  Result Value Ref Range Status   Specimen Description URINE, RANDOM  Final    Special Requests NONE  Final   Colony Count   Final    40,000 COLONIES/ML Performed at [facility]    Culture   Final    ENTEROCOCCUS SPECIES Performed at [facility]    Report Status PENDING  Incomplete     Studies: Dg Chest Port 1 View  07/17/2014   CLINICAL DATA:  Pneumonia.  EXAM: PORTABLE CHEST - 1 VIEW  COMPARISON:  07/16/2014 .  FINDINGS: Mediastinum hilar structures normal. Cardiomegaly with normal pulmonary vascularity. Mild right upper lobe infiltrate. Decreased aeration from  prior exam . Bibasilar atelectasis and/or infiltrates. No pleural effusion or pneumothorax.  IMPRESSION: 1. Mild infiltrate right upper lobe. Decreased aeration from prior exam. Bibasilar atelectasis and/or infiltrates. 2. Cardiomegaly.  No pulmonary venous congestion.   Electronically Signed   By: [place]   On: 07/17/2014 07:32    Scheduled Meds: . amLODipine  5 mg Oral Daily  . antiseptic oral rinse  7 mL Mouth Rinse BID  . atorvastatin  10 mg Oral Daily  . docusate sodium  100 mg Oral BID  . enzalutamide  40 mg Oral Daily  . ezetimibe  10 mg Oral Daily  . levofloxacin  500 mg Oral Daily  . multivitamin with minerals  1 tablet Oral Daily  . omega-3 acid ethyl esters  1 g Oral BID  . polyethylene glycol  17 g Oral Daily  . predniSONE  40 mg Oral Q breakfast  . sodium chloride  3 mL Intravenous Q12H  . tamsulosin  0.4 mg Oral Daily  . warfarin  5 mg Oral ONCE-1800  . Warfarin - Pharmacist Dosing Inpatient   Does not apply q1800   Continuous Infusions: . heparin 1,350 Units/hr (07/18/14 1058)    Time Spent: 25 min   FELIZ Marguarite Arbour  [facility] Pager [phone]. If 7PM-7AM, please contact night-coverage at [email or web address], password [email or web address] 07/18/2014, 11:11 AM  LOS: 7 days

## 2014-07-18 NOTE — Clinical Social Work Note (Signed)
Clinical Social Work Assessment  Patient Details  Name: Craig [name]. MRN: [id number] Date of Birth: March 27, 1924  Date of referral:  07/18/14               Reason for consult:  Facility Placement                Permission sought to share information with:  Family Supports Permission granted to share information::  Yes, Verbal Permission Granted  Name::     Engineer, manufacturing::     Relationship::  wife  Contact Information:     Housing/Transportation Living arrangements for the past 2 months:  Single Family Home Source of Information:  Patient, Adult Children, Spouse Patient Interpreter Needed:  None Criminal Activity/Legal Involvement Pertinent to Current Situation/Hospitalization:    Significant Relationships:  Adult Children, Spouse Lives with:    Do you feel safe going back to the place where you live?  Yes Need for family participation in patient care:  Yes (Comment)  Care giving concerns:  Patient lives at home with his wife, Craig [name].  Pt wife states that he has 24 hour physical assistance available at home through home health aids, RN visits, and family- pt wife also states that they have lots of home health equipment to help transfer patient within the home   Social Worker assessment / plan:  CSW spoke with pt and pt family (wife and dtr) concerning potential SNF placement.    Employment status:  Retired Nurse, adult PT Recommendations:  [place], Home with [place] / Referral to community resources:  [place]  Patient/Family's Response to care:  Patient and family have strong desire for pt to return home with home health services.  State that they have had pt at home with home health for the past 2 months and thought that he progressed better there.  Pt family is not agreeable to SNF at this time unless it is absolutely necessary.  Patient/Family's Understanding of and Emotional Response to Diagnosis,  Current Treatment, and Prognosis:  Pts family is very involved in care and seem to have a good grasp on patient condition and prognosis  Emotional Assessment Appearance:  Appears stated age Attitude/Demeanor/Rapport:    Affect (typically observed):  Calm, Pleasant, Quiet Orientation:  Oriented to Self, Oriented to Place, Oriented to  Time, Oriented to Situation Alcohol / Substance use:  Not Applicable Psych involvement (Current and /or in the community):  No (Comment)  Discharge Needs  Concerns to be addressed:  Discharge Planning Concerns Readmission within the last 30 days:  No Current discharge risk:  Physical Impairment Barriers to Discharge:  Continued Medical Work up   [employer], LCSW 07/18/2014, 11:35 AM

## 2014-07-18 NOTE — Discharge Instructions (Addendum)

## 2014-07-18 NOTE — Progress Notes (Signed)
Name: Craig [name]. 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  Patient reports he had a bronchitis episode and was coughing for 1 wk then on the day of presentation to the hospital had an episode where he coughed up a cup of blood.  That resolved spontaneously and has not occurred since.  Patient is on coumadin for a-fib. CTA showed a "chronic" PE.  Also of note, prostate cancer with mets to the spine on lupron.  SIGNIFICANT EVENTS  5/19 hemoptysis 5/23 to ICU with progressive hemoptysis 5/24- bronch with There were bloody secretions from airways. There was also mass like lesion eminating from lingular and Lt lower lobe orifices. Endobronchial biopsy from both locations done  STUDIES:  5/19 CTA with a "chronic" PE 5/23 path bronch bx>>>neg  SUBJECTIVE:  Coughed up one old clot otherwise no issue   VITAL SIGNS: Temp:  [97.7 F (36.5 C)-98.1 F (36.7 C)] 97.7 F (36.5 C) (05/26 0419) Pulse Rate:  [65-77] 70 (05/26 0419) Resp:  [16-18] 17 (05/26 0419) BP: (127-163)/(56-68) 163/67 mmHg (05/26 1105) SpO2:  [96 %-99 %] 96 % (05/26 0419) Room air  PHYSICAL EXAMINATION: General: chronically ill appearing and debilitated elderly male, no distress Neuro: follows commands, oriented but perseverates  HEENT: no sinus tenderness, no oral exudate Cardiovascular: regular s1 s 2 rr Lungs: basilar rales, no accessory muscle use  Abdomen:  Soft, non tender Musculoskeletal: right knuckles erythremic and painful to touch -->improved  Skin: no rashes  CBC Recent Labs     07/16/14  0220  07/17/14  0426  07/18/14  0412  WBC  11.1*  8.4  6.4  HGB  9.9*  9.0*  9.3*  HCT  31.1*  27.5*  27.4*  PLT  157  162  168     Coag's Recent Labs     07/18/14  0412  INR  1.29    BMET Recent Labs     07/16/14  0220  07/17/14  0426  07/18/14  0412  NA  134*  134*  136  K  4.3  4.1  4.5  CL  99*  99*  102  CO2  27  26  27   BUN  45*  51*  59*  CREATININE  1.53*  1.73*  1.80*  GLUCOSE  117*  82  128*    Imaging Dg Chest Port 1 View  07/17/2014   CLINICAL DATA:  Pneumonia.  EXAM: PORTABLE CHEST - 1 VIEW  COMPARISON:  07/16/2014 .  FINDINGS: Mediastinum hilar structures normal. Cardiomegaly with normal pulmonary vascularity. Mild right upper lobe infiltrate. Decreased aeration from prior exam . Bibasilar atelectasis and/or infiltrates. No pleural effusion or pneumothorax.  IMPRESSION: 1. Mild infiltrate right upper lobe. Decreased aeration from prior exam. Bibasilar atelectasis and/or infiltrates. 2. Cardiomegaly.  No pulmonary venous congestion.   Electronically Signed   By: [name]  Register   On: 07/17/2014 07:32   Lower volume film. No sig change    ASSESSMENT / PLAN:  Hemoptysis-->resolved Endobronchial mass like lesion Lingula LLL-->negative path  Chronic PE Wonder if all these finding were more from PNA??  Plan : Keep even to neg IS Agree, start coumadin (watch for further bleeding)  2nd pulmonary HTN. Hx of  HTN, HLD, A fib. Plan: Monitor hemodynamics Norvasc, lipitor, zetia, cozaar, lovaza. Start heparin w/out bolus, monitor, if no issues can start coumadin 5/26    Acute on chronic renal failure in setting of diuresis D/c lasix 5/25, f/u chemistry still w/ creatinine rising  Plan Agree w/ IVFs  Probable Gout flare; right hand Plan Stop lasix  pred taper    Hx of metastatic prostate cancer. Plan: Enzalutamide scd  Deconditioning Plan Will need PT and OT  Possible CAP (NOS); received 3 days azithro.  Plan: Transitioned to oral levaquin, complete 7-8d total    We will s/o call PRN   Erick Colace ACNP-BC [place] Pager # [phone] OR #  [phone] if no answer   PCCM ATTENDING: I have reviewed pt's initial presentation, consultants notes and hospital database in detail.  The above assessment and plan was formulated under my direction.   Merton Border, MD;  PCCM service; Mobile [phone]

## 2014-07-19 LAB — CBC
HCT: 28.1 % — ABNORMAL LOW (ref 39.0–52.0)
Hemoglobin: 9.3 g/dL — ABNORMAL LOW (ref 13.0–17.0)
MCH: 31.1 pg (ref 26.0–34.0)
MCHC: 33.1 g/dL (ref 30.0–36.0)
MCV: 94 fL (ref 78.0–100.0)
Platelets: 200 10*3/uL (ref 150–400)
RBC: 2.99 MIL/uL — ABNORMAL LOW (ref 4.22–5.81)
RDW: 14.2 % (ref 11.5–15.5)
WBC: 7.5 10*3/uL (ref 4.0–10.5)

## 2014-07-19 LAB — BASIC METABOLIC PANEL
ANION GAP: 8 (ref 5–15)
BUN: 49 mg/dL — ABNORMAL HIGH (ref 6–20)
CO2: 25 mmol/L (ref 22–32)
Calcium: 8.5 mg/dL — ABNORMAL LOW (ref 8.9–10.3)
Chloride: 103 mmol/L (ref 101–111)
Creatinine, Ser: 1.41 mg/dL — ABNORMAL HIGH (ref 0.61–1.24)
GFR calc Af Amer: 50 mL/min — ABNORMAL LOW (ref >60)
GFR calc non Af Amer: 43 mL/min — ABNORMAL LOW (ref >60)
Glucose, Bld: 92 mg/dL (ref 65–99)
POTASSIUM: 4.4 mmol/L (ref 3.5–5.1)
Sodium: 136 mmol/L (ref 135–145)

## 2014-07-19 LAB — PROTIME-INR
INR: 1.3 (ref 0.00–1.49)
PROTHROMBIN TIME: 16.3 s — AB (ref 11.6–15.2)

## 2014-07-19 MED ORDER — LEVOFLOXACIN 250 MG PO TABS: 250.0000 mg | ORAL_TABLET | ORAL | Status: DC

## 2014-07-19 MED ORDER — BENZONATATE 200 MG PO CAPS
200.0000 mg | ORAL_CAPSULE | Freq: Three times a day (TID) | ORAL | Status: DC | PRN
Start: 2014-07-19 — End: 2014-12-31

## 2014-07-19 MED ORDER — ENOXAPARIN SODIUM 80 MG/0.8ML ~~LOC~~ SOLN: 80.0000 mg | SUBCUTANEOUS | Status: DC

## 2014-07-19 MED ORDER — PREDNISONE 20 MG PO TABS: 40.0000 mg | ORAL_TABLET | Freq: Every day | ORAL | Status: DC

## 2014-07-19 NOTE — Care Management Note (Signed)
Case Management Note  Patient Details  Name: Johannes Everage. MRN: [id number] Date of Birth: March 16, 1924  Subjective/Objective:   Pt admittted with hemoptysis           Action/Plan:  MD requested HH, CM will arrange as ordered  Expected Discharge Date:                  Expected Discharge Plan:  [place]  In-House Referral:     Discharge planning Services  CM Consult  Post Acute Care Choice:    Choice offered to:     DME Arranged:    DME Agency:     HH Arranged:  RN, PT, OT, Nurse's Aide [place] Agency:     Status of Service:   Complete, will sign off  Medicare Important Message Given:  Yes Date Medicare IM Given:  07/12/14 Medicare IM give by:  Jacqlyn Krauss, RN. BSN Date Additional Medicare IM Given:  07/16/14 Additional Medicare Important Message give by:  Luz Lex, RNBSN   If discussed at Long Length of Stay Meetings, dates discussed:    Additional Comments: 07/19/14 Medicare Important Message given? Yes (If Response is "NO", the following Medicare IM given date fields will be blank) Date Medicare IM given: 07/19/14 Medicare IM given by: Elenor Quinones  CM offered pt choice, pt chose [facility].  [facility] contacted, referral accepted, CM specified order for RN      Elevated INR check on 07/22/2014 and sent to the Coumadin clinic at [facility], also check a CBC at this time.   Address and phone number verified against epic.  Pt wife will provide supervision as recommended by PT.  No additional CM needs at this time.  Maryclare Labrador, RN 07/19/2014, 2:15 PM

## 2014-07-19 NOTE — Progress Notes (Signed)
IV d/c at this time; wife in room wanting to bath patient prior to d/c home; assisted wife to get pt from bed to chair with walker and gait belt; wife stating they will d/c home after lunch; scripts have been called in to pharmacy; will cont. To monitor.

## 2014-07-19 NOTE — Discharge Summary (Signed)
Physician Discharge Summary  Craig [name]. AVW:[id number] DOB: 1924-10-23 DOA: 07/11/2014  PCP: [name], MD  Admit date: 07/11/2014 Discharge date: 07/19/2014  Time spent: 25 minutes  Recommendations for Outpatient Follow-up:  1. Follow-up with Dr. Wilhemena Durie to 4 weeks. 2. Follow-up with the Coumadin clinic in one week. 3. Home health physical therapy and RN INR to be checked on 07/22/2014.  Discharge Diagnoses:  Principal Problem:   Hemoptysis Active Problems:   Chronic atrial fibrillation   Prostate cancer metastatic to bone   Chronic kidney disease (CKD), stage III (moderate)   Hypercalcemia   Pulmonary embolus   Hypertensive urgency   Cough with hemoptysis   Discharge Condition: stable  Diet recommendation: heart healthy  Filed Weights   07/12/14 0100 07/13/14 0500 07/14/14 0450  Weight: 83.8 kg (184 lb 11.9 oz) 82.691 kg (182 lb 4.8 oz) 82.373 kg (181 lb 9.6 oz)    History of present illness:  81 y.o. male with history of metastatic prostate cancer, chronic diastolic CHF, hypertension, chronic atrial fibrillation on Coumadin was brought to the ER after patient had coughed up blood as noticed by patient's wife. Patient's wife states that he has coughed up 3-4 times at least significant amount of frank blood. Patient did not have any chest pain or shortness of breath. In the ER CT angiogram shows chronic pulmonary embolism. On-call pulmonary critical care Dr.Sommer was consulted by the ER physician and Dr. Oletta Darter has recommended to keep patient on IV heparin and observe. Pulmonary will be seeing patient in consult. Patient is on Coumadin for atrial fibrillation and has had previous episodes of epistaxis and as per the family cardiologist is planning to keep on the lower range of the INR. Patient at this time is not in distress.   Hospital Course:  Acute on chronic PE with hemoptysis: He was started on IV heparin once his hemoptysis resolved his Coumadin was  started. Due to his borderline GFR Coumadin was chosen overlying the thorax. He will go home on Lovenox for 4 additional days and will continue Coumadin. Home health nurse to check an INR and sent to the Coumadin clinic.  Atrial fibrillation/pulmonary hypertension: He has remained hemodynamically stable, will continue Norvasc Lipitor Lasix and lovaza Can resume ARB as an outpatient.  Acute on chronic renal disease was likely due to overdiuresis: His Lasix was held along with his ARB. He was gently hydrated and his creatinine has steadily come down. We can resume Lasix and ARB as an outpatient.  Probable gout flare: His Lasix was held due to started on prednisone his right hand gout flare improved. He will continue this for 3 additional days.  History of metastatic prostate cancer: No changes were made to his medication.  Possible community-acquired pneumonia: Due to the infiltrates seen on chest x-ray he was treated empirically with oral antibiotic he will continue this until 08/23/2014.  Hypocalcemia: Corrected for albumin within normal.    Procedures:  CXR  Hand x-ray  CT angio chest  Consultations:  PCCM  Discharge Exam: Filed Vitals:   07/19/14 0514  BP: 146/93  Pulse: 73  Temp: 98 F (36.7 C)  Resp: 20    General: A&O x3 Cardiovascular: RRR Respiratory: good air movement CTA B/L  Discharge Instructions   Discharge Instructions    Diet - low sodium heart healthy    Complete by:  As directed      Increase activity slowly    Complete by:  As directed  Current Discharge Medication List    START taking these medications   Details  benzonatate (TESSALON) 200 MG capsule Take 1 capsule (200 mg total) by mouth 3 (three) times daily as needed for cough. Qty: 20 capsule, Refills: 0    enoxaparin (LOVENOX) 80 MG/0.8ML injection Inject 0.8 mLs (80 mg total) into the skin daily. Qty: 22.4 Syringe, Refills: 0    levofloxacin (LEVAQUIN) 250 MG  tablet Take 1 tablet (250 mg total) by mouth every other day. Qty: 2 tablet, Refills: 0    predniSONE (DELTASONE) 20 MG tablet Take 2 tablets (40 mg total) by mouth daily with breakfast. Qty: 4 tablet, Refills: 0      CONTINUE these medications which have NOT CHANGED   Details  acetaminophen (TYLENOL) 500 MG tablet Take 500 mg by mouth See admin instructions. Takes two tablets in the morning and two tablets at night    amLODipine (NORVASC) 5 MG tablet Take 1 tablet (5 mg total) by mouth daily. Qty: 90 tablet, Refills: 1    atorvastatin (LIPITOR) 10 MG tablet Take 1 tablet (10 mg total) by mouth daily. Qty: 90 tablet, Refills: 11   Associated Diagnoses: Hyperlipidemia    B Complex-C (B-COMPLEX WITH VITAMIN C) tablet Take 1 tablet by mouth daily.      Denosumab (XGEVA ) Inject into the skin every 30 (thirty) days. Monthly. Last inj was in April 2016. Wife not sure what dose it is    Docusate Calcium (STOOL SOFTENER PO) Take 1 capsule by mouth 2 (two) times daily.    enzalutamide (XTANDI) 40 MG capsule Take 40 mg by mouth daily.    furosemide (LASIX) 40 MG tablet Take 1 tablet (40 mg total) by mouth daily. Qty: 90 tablet, Refills: 3   Associated Diagnoses: Prostate cancer metastatic to bone    Leuprolide Acetate (LUPRON DEPOT IM) Inject 1 each into the muscle every 6 (six) months. Last inj was in March 2016    losartan (COZAAR) 100 MG tablet Take 1 tablet (100 mg total) by mouth daily. Qty: 30 tablet, Refills: 2    multivitamin (THERAGRAN) per tablet Take 1 tablet by mouth daily. ( with B - Complex )    Omega-3 Fatty Acids (FISH OIL PO) Take 1 capsule by mouth daily. ( Mega Red )    tamsulosin (FLOMAX) 0.4 MG CAPS capsule Take 1 capsule (0.4 mg total) by mouth daily. Qty: 30 capsule, Refills: 2    warfarin (COUMADIN) 5 MG tablet Take 1 tablet (5 mg total) by mouth as directed. Qty: 30 tablet, Refills: 3    ZETIA 10 MG tablet TAKE 1 TABLET BY MOUTH ONCE DAILY Qty: 30  tablet, Refills: 5       Allergies  Allergen Reactions  . Pravachol     Muscle weakness  . Zocor [Simvastatin - High Dose]     Muscle weakness   Follow-up Information    Follow up with [name], MD In 2 weeks.   Specialty:  Cardiology   Why:  for   Contact information:   [address] [place] [address] [phone]       Please follow up.   Why:  follow up wor PE       The results of significant diagnostics from this hospitalization (including imaging, microbiology, ancillary and laboratory) are listed below for reference.    Significant Diagnostic Studies: Ct Angio Chest Pe W/cm &/or Wo Cm  07/11/2014   CLINICAL DATA:  Hemoptysis today. History  of chronic anticoagulant use, prostate cancer, melanoma, pneumonia, hypertension.  EXAM: CT ANGIOGRAPHY CHEST WITH CONTRAST  TECHNIQUE: Multidetector CT imaging of the chest was performed using the standard protocol during bolus administration of intravenous contrast. Multiplanar CT image reconstructions and MIPs were obtained to evaluate the vascular anatomy.  CONTRAST:  125mL OMNIPAQUE IOHEXOL 350 MG/ML SOLN  COMPARISON:  Chest radiograph April 18, 2014 and MRI of the thoracic spine February 09, 2014  FINDINGS: PULMONARY ARTERY: Adequate contrast opacification of the pulmonary artery's. Main pulmonary artery is enlarged, 4.1 cm in transaxial dimension. Linear filling defect in the LEFT lower lobe bronchus, adherent to the anterior wall of the pulmonary artery, propagating into LEFT lower lobe segmental branch with occlusion.  MEDIASTINUM: The heart is moderately enlarged, no right heart strain. Calcific atherosclerosis of the aortic arch with small eccentric undersurface chronic dissection with peripheral calcification. 16 mm short axis aortopulmonary window lymph node, 14 mm short axis pretracheal lymph node. Calcified LEFT hilar lymph nodes.  LUNGS: Tracheobronchial tree is patent, no pneumothorax. Small RIGHT  greater than LEFT layering pleural effusions. Mild bronchial wall thickening diffusely. No focal consolidation. Atelectasis in the lower lobes, RIGHT middle lobe and lingula. LEFT lower lobe calcified granuloma.  SOFT TISSUES AND OSSEOUS STRUCTURES: Included view of the abdomen demonstrates calcified splenic granulomas, no acute process. Status post T11 vertebral bodies cement augmentation, similar T6 moderate compression fracture. New linear sclerosis through T9 vertebral body concerning for is early insufficiency fracture without height loss.  Review of the MIP images confirms the above findings.  IMPRESSION: Chronic appearing LEFT lower lobe pulmonary embolus, occlusive within the segmental branch, without convincing evidence of acute pulmonary embolus. Moderate cardiomegaly without RIGHT heart strain. Enlarged main pulmonary artery most consistent with chronic pulmonary arterial hypertension.  Small bilateral pleural effusions. Bronchial wall thickening most consistent bronchitis.  New linear sclerosis in vertebral body T9 concerning for early insufficiency fracture without height loss.   Electronically Signed   By: Elon Alas   On: 07/11/2014 22:59   Dg Hand 2 View Right  07/15/2014   CLINICAL DATA:  Medial hand pain since yesterday. No known injury. Initial encounter.  EXAM: RIGHT HAND - 2 VIEW  COMPARISON:  None.  FINDINGS: The mineralization and alignment are satisfactory. There is no evidence of acute fracture, dislocation or bone destruction. Mild interphalangeal degenerative changes are present. There are also mild degenerative changes within the radial aspect of the wrist. The soft tissues surrounding the wrist appear mildly prominent, potentially swollen.  IMPRESSION: Osteoarthritic changes as described with possible soft tissue swelling around the wrist. No acute osseous findings.   Electronically Signed   By: Richardean Sale M.D.   On: 07/15/2014 17:35   Dg Chest Port 1 View  07/17/2014    CLINICAL DATA:  Pneumonia.  EXAM: PORTABLE CHEST - 1 VIEW  COMPARISON:  07/16/2014 .  FINDINGS: Mediastinum hilar structures normal. Cardiomegaly with normal pulmonary vascularity. Mild right upper lobe infiltrate. Decreased aeration from prior exam . Bibasilar atelectasis and/or infiltrates. No pleural effusion or pneumothorax.  IMPRESSION: 1. Mild infiltrate right upper lobe. Decreased aeration from prior exam. Bibasilar atelectasis and/or infiltrates. 2. Cardiomegaly.  No pulmonary venous congestion.   Electronically Signed   By: [name]  Register   On: 07/17/2014 07:32   Dg Chest Port 1 View  07/16/2014   CLINICAL DATA:  Hemoptysis.  Shortness of breath.  EXAM: PORTABLE CHEST - 1 VIEW  COMPARISON:  07/15/2014.  FINDINGS: Mediastinum and hilar structures are normal. Cardiomegaly.  Developing bilateral basilar pulmonary interstitial prominence suggesting mild congestive heart failure. Pneumonitis cannot be excluded. Low lung volumes with mild basilar atelectasis. Small left pleural effusion cannot be excluded. No pneumothorax.  IMPRESSION: 1. Congestive heart failure with mild bilateral from interstitial edema. Pneumonitis cannot be excluded. Small left pleural effusion .  2.  Lung volumes with mild basilar atelectasis.   Electronically Signed   By: [name]  Register   On: 07/16/2014 07:06   Dg Chest Port 1 View  07/15/2014   CLINICAL DATA:  Hemoptysis  EXAM: PORTABLE CHEST - 1 VIEW  COMPARISON:  07/11/2014  FINDINGS: Cardiomegaly. No acute infiltrate or pulmonary edema. Mild left basilar atelectasis. Atherosclerotic calcifications of thoracic aorta. Degenerative changes thoracic spine.  IMPRESSION: Cardiomegaly. Left base atelectasis. Atherosclerotic calcifications of thoracic aorta.   Electronically Signed   By: Lahoma Crocker M.D.   On: 07/15/2014 09:32    Microbiology: Recent Results (from the past 240 hour(s))  Culture, blood (routine x 2)     Status: None   Collection Time: 07/11/14  8:37 PM  Result  Value Ref Range Status   Specimen Description BLOOD RIGHT FOREARM  Final   Special Requests BOTTLES DRAWN AEROBIC AND ANAEROBIC 5CC  Final   Culture   Final    NO GROWTH 5 DAYS Performed at [facility]    Report Status 07/18/2014 FINAL  Final  MRSA PCR Screening     Status: None   Collection Time: 07/12/14  2:22 AM  Result Value Ref Range Status   MRSA by PCR NEGATIVE NEGATIVE Final    Comment:        The GeneXpert MRSA Assay (FDA approved for NASAL specimens only), is one component of a comprehensive MRSA colonization surveillance program. It is not intended to diagnose MRSA infection nor to guide or monitor treatment for MRSA infections.   MRSA PCR Screening     Status: None   Collection Time: 07/15/14 10:15 AM  Result Value Ref Range Status   MRSA by PCR NEGATIVE NEGATIVE Final    Comment:        The GeneXpert MRSA Assay (FDA approved for NASAL specimens only), is one component of a comprehensive MRSA colonization surveillance program. It is not intended to diagnose MRSA infection nor to guide or monitor treatment for MRSA infections.   Culture, Urine     Status: None   Collection Time: 07/15/14  2:15 PM  Result Value Ref Range Status   Specimen Description URINE, RANDOM  Final   Special Requests NONE  Final   Colony Count   Final    40,000 COLONIES/ML Performed at [facility]    Culture   Final    ENTEROCOCCUS SPECIES Performed at [facility]    Report Status 07/18/2014 FINAL  Final   Organism ID, Bacteria ENTEROCOCCUS SPECIES  Final      Susceptibility   Enterococcus species - MIC*    AMPICILLIN <=2 SENSITIVE Sensitive     LEVOFLOXACIN 1 SENSITIVE Sensitive     NITROFURANTOIN <=16 SENSITIVE Sensitive     VANCOMYCIN 1 SENSITIVE Sensitive     TETRACYCLINE >=16 RESISTANT Resistant     * ENTEROCOCCUS SPECIES     Labs: Basic Metabolic Panel:  Recent Labs Lab 07/16/14 0220 07/17/14 0426 07/18/14 0412 07/19/14 0505  NA  134* 134* 136 136  K 4.3 4.1 4.5 4.4  CL 99* 99* 102 103  CO2 27 26 27 25   GLUCOSE 117* 82 128* 92  BUN 45*  51* 59* 49*  CREATININE 1.53* 1.73* 1.80* 1.41*  CALCIUM 9.2 8.6* 8.6* 8.5*   Liver Function Tests:  Recent Labs Lab 07/17/14 0426 07/18/14 0412  AST 19 29  ALT 15* 26  ALKPHOS 56 72  BILITOT 0.5 0.6  PROT 5.5* 5.5*  ALBUMIN 2.3* 2.3*   No results for input(s): LIPASE, AMYLASE in the last 168 hours. No results for input(s): AMMONIA in the last 168 hours. CBC:  Recent Labs Lab 07/15/14 0310 07/16/14 0220 07/17/14 0426 07/18/14 0412 07/19/14 0505  WBC 8.5 11.1* 8.4 6.4 7.5  NEUTROABS  --   --  5.6  --   --   HGB 10.2* 9.9* 9.0* 9.3* 9.3*  HCT 31.8* 31.1* 27.5* 27.4* 28.1*  MCV 95.2 96.0 96.2 93.8 94.0  PLT 163 157 162 168 200   Cardiac Enzymes: No results for input(s): CKTOTAL, CKMB, CKMBINDEX, TROPONINI in the last 168 hours. BNP: BNP (last 3 results) No results for input(s): BNP in the last 8760 hours.  ProBNP (last 3 results)  Recent Labs  02/08/14 1148  PROBNP 6362.0*    CBG:  Recent Labs Lab 07/15/14 0947 07/16/14 1629 07/16/14 2127 07/17/14 0618  GLUCAP 118* 117* 98 84       Signed:  FELIZ ORTIZ, Briyah Wheelwright  [facility] 07/19/2014, 10:10 AM

## 2014-07-19 NOTE — Progress Notes (Signed)
ANTICOAGULATION CONSULT NOTE - Follow Up Consult  Pharmacy Consult for enoxaparin / warfarin Indication: PE and afib  Allergies  Allergen Reactions  . Pravachol     Muscle weakness  . Zocor [Simvastatin - High Dose]     Muscle weakness    Patient Measurements: Height:  (6'1") Weight: 181 lb 9.6 oz (82.373 kg) IBW/kg (Calculated) : 77.6 Heparin Dosing Weight: 83.8 kg  Vital Signs: Temp: 98 F (36.7 C) (05/27 0514) Temp Source: Oral (05/27 0514) BP: 146/93 mmHg (05/27 0514) Pulse Rate: 73 (05/27 0514)  Labs:  Recent Labs  07/17/14 0426 07/17/14 2000 07/18/14 0412 07/18/14 0640 07/19/14 0505  HGB 9.0*  --  9.3*  --  9.3*  HCT 27.5*  --  27.4*  --  28.1*  PLT 162  --  168  --  200  LABPROT  --   --  16.2*  --  16.3*  INR  --   --  1.29  --  1.30  HEPARINUNFRC  --  <0.10*  --  0.30  --   CREATININE 1.73*  --  1.80*  --  1.41*    Estimated Creatinine Clearance: 40.5 mL/min (by C-G formula based on Cr of 1.41).   Assessment: 81yo male on Coumadin PTA for afib. He is noted with CT showing chronic PE  and also with prostate cancer with mets. He was previously on heparin and this was stopped on 5/21 due to hemoptysis. A endoscopy was done 5/23 showing an endobronchial mass and he is s/p biopsy on 5/23.Heparin has been resumed with plans for coumadin 5/26 if no further bleeding (last episode of hemoptysis was 5/23).  Heparin was changed to enoxaparin 1mg /kg = 80mg  q24 ( dose of CrCl < 30) real function has improved but give age, bleeding and low INR goal and now new PE per CT - will continue q24 dosing  INR 1.3 after 1 dose Warfarin 5mg   - repeat today  Goal of Therapy:  INR goal 1.8-2.2 Monitor platelets by anticoagulation protocol: Yes   Plan:  Enoxaparin 80mg  q24 Warfarin 5mg  x1 today then 2.5mg  daily F/u outpt INR next week  Bonnita Nasuti Pharm.D. CPP, BCPS Clinical Pharmacist [phone] 07/19/2014 10:07 AM

## 2014-07-22 ENCOUNTER — Telehealth: Payer: Self-pay | Admitting: Physician Assistant

## 2014-07-22 NOTE — Telephone Encounter (Signed)
Received page on holiday answering service today from [facility] with [facility] -- patient's INR is 1.3 today.  He was recently admitted 5/19-5/27 with hemoptysis. He has history of AF on Coumadin. He was found to have subtherapeutic INR on admission (1.6) while he was on Coumadin 2.5mg  daily except 5mg  on Wednesdays. Goal INR 1.8-2.2 due to prior history of epistaxis. He was then found to have PE. He was placed on Lovenox/Coumadin bridge. He was discharged home on this combination. INR at discharge was 1.3.   I spoke with his wife. She is really on the ball with his medicines (retired Therapist, sports). She denies any increase in leafy greens/vitamin K intake. Today is his last dose of Levaquin. She confirms he received the following meds:  Thursday 5/26 - Coumadin 5mg , Lovenox 80mg  Friday 5/27 - Coumadin 5mg , Lovenox 80mg  Saturday 5/28 - Coumadin 2.5mg , Lovenox 80mg  Sunday 5/29 - Coumadin 2.5mg , Lovenox 80mg  (He takes these all at night.)  I spoke with pharmacy here at [facility]. We both share the concern that INR is not rising yet despite concomitant Levaquin use, but anticipate that it may be be trending upward soon. The pharmacist recommended he take 5mg  of Coumadin tonight and another dose of Lovenox tonight, with close INR follow-up. I spoke with Stanton Kidney at [facility] and requested INR recheck tomorrow morning to be called to our Coumadin clinic for further instructions. His wife has one more dose of Lovenox available to give tonight. It will need to be determined tomorrow if he will need refill of his Lovenox. I reiterated these instructions to [facility] and the patient's wife who both verbalized understanding of plan.  Dayna Dunn PA-C

## 2014-07-23 ENCOUNTER — Other Ambulatory Visit: Payer: Self-pay | Admitting: Cardiology

## 2014-07-23 ENCOUNTER — Ambulatory Visit (INDEPENDENT_AMBULATORY_CARE_PROVIDER_SITE_OTHER): Payer: Medicare Other | Admitting: Cardiology

## 2014-07-23 ENCOUNTER — Other Ambulatory Visit: Payer: Self-pay | Admitting: Oncology

## 2014-07-23 ENCOUNTER — Telehealth: Payer: Self-pay | Admitting: *Deleted

## 2014-07-23 DIAGNOSIS — I482 Chronic atrial fibrillation, unspecified: Secondary | ICD-10-CM

## 2014-07-23 DIAGNOSIS — Z5181 Encounter for therapeutic drug level monitoring: Secondary | ICD-10-CM

## 2014-07-23 LAB — POCT INR: INR: 1.4

## 2014-07-23 MED ORDER — ENOXAPARIN SODIUM 80 MG/0.8ML ~~LOC~~ SOLN: 80.0000 mg | SUBCUTANEOUS | Status: DC

## 2014-07-23 NOTE — Telephone Encounter (Signed)
SPOKE WITH DR.SHADAD'S NURSE, STACEY CAMP,RN. THE REFILL REQUEST IS IN PROCESS. NOTIFIED PT.'S WIFE.

## 2014-07-24 ENCOUNTER — Telehealth: Payer: Self-pay | Admitting: Oncology

## 2014-07-24 ENCOUNTER — Other Ambulatory Visit (HOSPITAL_BASED_OUTPATIENT_CLINIC_OR_DEPARTMENT_OTHER): Payer: Medicare Other

## 2014-07-24 ENCOUNTER — Ambulatory Visit (HOSPITAL_BASED_OUTPATIENT_CLINIC_OR_DEPARTMENT_OTHER): Payer: Medicare Other | Admitting: Oncology

## 2014-07-24 VITALS — BP 155/58 | HR 18 | Temp 97.6°F | Resp 18 | Ht 72.0 in

## 2014-07-24 DIAGNOSIS — C61 Malignant neoplasm of prostate: Secondary | ICD-10-CM

## 2014-07-24 DIAGNOSIS — C7951 Secondary malignant neoplasm of bone: Secondary | ICD-10-CM | POA: Diagnosis not present

## 2014-07-24 DIAGNOSIS — I2699 Other pulmonary embolism without acute cor pulmonale: Secondary | ICD-10-CM

## 2014-07-24 DIAGNOSIS — E291 Testicular hypofunction: Secondary | ICD-10-CM

## 2014-07-24 LAB — CBC WITH DIFFERENTIAL/PLATELET
BASO%: 0.7 % (ref 0.0–2.0)
BASOS ABS: 0.1 10*3/uL (ref 0.0–0.1)
EOS%: 4.1 % (ref 0.0–7.0)
Eosinophils Absolute: 0.4 10*3/uL (ref 0.0–0.5)
HEMATOCRIT: 37.6 % — AB (ref 38.4–49.9)
HEMOGLOBIN: 12.4 g/dL — AB (ref 13.0–17.1)
LYMPH%: 15.5 % (ref 14.0–49.0)
MCH: 31.6 pg (ref 27.2–33.4)
MCHC: 32.9 g/dL (ref 32.0–36.0)
MCV: 96.2 fL (ref 79.3–98.0)
MONO#: 1.2 10*3/uL — AB (ref 0.1–0.9)
MONO%: 10.6 % (ref 0.0–14.0)
NEUT#: 7.6 10*3/uL — ABNORMAL HIGH (ref 1.5–6.5)
NEUT%: 69.1 % (ref 39.0–75.0)
Platelets: 306 10*3/uL (ref 140–400)
RBC: 3.91 10*6/uL — ABNORMAL LOW (ref 4.20–5.82)
RDW: 15.2 % — AB (ref 11.0–14.6)
WBC: 10.9 10*3/uL — AB (ref 4.0–10.3)
lymph#: 1.7 10*3/uL (ref 0.9–3.3)

## 2014-07-24 LAB — COMPREHENSIVE METABOLIC PANEL (CC13)
ALT: 42 U/L (ref 0–55)
ANION GAP: 9 meq/L (ref 3–11)
AST: 20 U/L (ref 5–34)
Albumin: 3 g/dL — ABNORMAL LOW (ref 3.5–5.0)
Alkaline Phosphatase: 69 U/L (ref 40–150)
BUN: 42.9 mg/dL — ABNORMAL HIGH (ref 7.0–26.0)
CO2: 30 meq/L — AB (ref 22–29)
CREATININE: 1.5 mg/dL — AB (ref 0.7–1.3)
Calcium: 10.8 mg/dL — ABNORMAL HIGH (ref 8.4–10.4)
Chloride: 101 mEq/L (ref 98–109)
EGFR: 41 mL/min/{1.73_m2} — ABNORMAL LOW (ref >90)
Glucose: 122 mg/dl (ref 70–140)
Potassium: 4.1 mEq/L (ref 3.5–5.1)
SODIUM: 140 meq/L (ref 136–145)
Total Bilirubin: 0.4 mg/dL (ref 0.20–1.20)
Total Protein: 6.4 g/dL (ref 6.4–8.3)

## 2014-07-24 NOTE — Telephone Encounter (Signed)
Gave and printed appt sched and avs for pt for July  °

## 2014-07-24 NOTE — Progress Notes (Addendum)
Hematology and Oncology Follow Up Visit  Caitlin Hillmer [id number] November 19, 1924 81 y.o. 07/24/2014 12:13 PM [name], MDBrackbill, [name], MD   Principle Diagnosis: 81 year old with Castration resistant prostate cancer with metastatic disease to the bone. He was initially diagnosed in 2011 PSA of 19 and presented with advanced disease.   Prior Therapy: He is status post combined androgen deprivation with Lupron and Casodex with an excellent response initially and had a PSA nadir down to 1.67. Most recently he developed progression of disease and a PSA up to 5.94 in September of 2014. He is status post SRS treatment to the L4 completed on 02/13/2014. He is S/P stereotactic body radiotherapy to the iliac bone treatment completed in 02/2014. He is status post radiation therapy to the right femur completed on 06/07/2014.  Current therapy: He is on Xtandi started on 11/15/2012. He was started on 160 mg initially but the dose was reduced to 80 mg in December of 2014. Treatment has been on hold for 2 months. Xtandi resumed at 40 mg daily starting on 06/12/2014. He continues to be on Lupron and Xgeva done at [facility]. .  Interim History: [name] presents today for a followup visit with his wife. Since the last visit, he was hospitalized between May 19 and 07/19/2014 for hemoptysis. He was found to be anemic as well and his workup was unrevealing. He had a bronchoscopy as well as a CT scan of the chest and did not show any clear etiology. He is hemoptysis have subsided and currently resumed Lovenox and Coumadin. He tolerated Xtandi of the current dose without any complications. His appetite is reasonable and his lower extremity edema have resolved. Has not reported any cough or hemoptysis or hematemesis. Does not report any nausea or vomiting or abdominal pain.Is not reporting any back pain or neurological symptoms. Does not report any headaches or blurry vision or double vision. Is  not reporting any chest pain shortness of breath or difficulty breathing. Does not report any constipation or diarrhea. Does not report any hematochezia or melena. Does not report any skin rashes or lesions. Rest of his review of system is unremarkable.  Medications: I have reviewed the patient's current medications.  Current Outpatient Prescriptions  Medication Sig Dispense Refill  . acetaminophen (TYLENOL) 500 MG tablet Take 500 mg by mouth See admin instructions. Takes two tablets in the morning and two tablets at night    . amLODipine (NORVASC) 5 MG tablet Take 1 tablet (5 mg total) by mouth daily. 90 tablet 1  . atorvastatin (LIPITOR) 10 MG tablet Take 1 tablet (10 mg total) by mouth daily. 90 tablet 11  . B Complex-C (B-COMPLEX WITH VITAMIN C) tablet Take 1 tablet by mouth daily.      . benzonatate (TESSALON) 200 MG capsule Take 1 capsule (200 mg total) by mouth 3 (three) times daily as needed for cough. 20 capsule 0  . Denosumab (XGEVA [place]) Inject into the skin every 30 (thirty) days. Monthly. Last inj was in April 2016. Wife not sure what dose it is    . Docusate Calcium (STOOL SOFTENER PO) Take 1 capsule by mouth 2 (two) times daily.    Marland Kitchen enoxaparin (LOVENOX) 80 MG/0.8ML injection Inject 0.8 mLs (80 mg total) into the skin daily. 5 Syringe 0  . enzalutamide (XTANDI) 40 MG capsule Take 40 mg by mouth daily.    . furosemide (LASIX) 40 MG tablet Take 1 tablet (40 mg total) by mouth daily. [place]  tablet 3  . Leuprolide Acetate (LUPRON DEPOT IM) Inject 1 each into the muscle every 6 (six) months. Last inj was in March 2016    . levofloxacin (LEVAQUIN) 250 MG tablet Take 1 tablet (250 mg total) by mouth every other day. 2 tablet 0  . losartan (COZAAR) 100 MG tablet Take 1 tablet (100 mg total) by mouth daily. 30 tablet 2  . multivitamin (THERAGRAN) per tablet Take 1 tablet by mouth daily. ( with B - Complex )    . Omega-3 Fatty Acids (FISH OIL PO) Take 1 capsule by mouth daily. ( Mega Red )    .  predniSONE (DELTASONE) 20 MG tablet Take 2 tablets (40 mg total) by mouth daily with breakfast. 4 tablet 0  . tamsulosin (FLOMAX) 0.4 MG CAPS capsule Take 1 capsule (0.4 mg total) by mouth daily. 30 capsule 2  . warfarin (COUMADIN) 5 MG tablet Take as directed by Coumadin clinic 30 tablet 2  . XTANDI 40 MG capsule TAKE 2 CAPSULES BY MOUTH DAILY 60 capsule 0  . ZETIA 10 MG tablet TAKE 1 TABLET BY MOUTH ONCE DAILY 30 tablet 5   No current facility-administered medications for this visit.     Allergies:  Allergies  Allergen Reactions  . Pravachol     Muscle weakness  . Zocor [Simvastatin - High Dose]     Muscle weakness    Past Medical History, Surgical history, Social history, and Family History were reviewed and updated.   Physical Exam: Blood pressure 155/58, pulse 18, temperature 97.6 F (36.4 C), temperature source Oral, resp. rate 18, height 6' (1.829 m), SpO2 99 %. ECOG: 2 General appearance: alert, awake elderly gentleman not in any distress. Head: Normocephalic, without obvious abnormality Neck: no adenopathy Lymph nodes: Cervical, supraclavicular, and axillary nodes normal. Heart:regular rate and rhythm, S1, S2 normal, no murmur, click, rub or gallop Lung:chest clear, no wheezing, rales, no dullness to percussion. Abdomin: soft, non-tender, without masses or organomegaly. Good bowel sounds. EXT: Trace edema noted bilaterally on top of the foot. Neurological examination: No deficits noted.  Lab Results: Lab Results  Component Value Date   WBC 10.9* 07/24/2014   HGB 12.4* 07/24/2014   HCT 37.6* 07/24/2014   MCV 96.2 07/24/2014   PLT 306 07/24/2014     Chemistry      Component Value Date/Time   NA 136 07/19/2014 0505   NA 139 06/11/2014 1314   K 4.4 07/19/2014 0505   K 3.9 06/11/2014 1314   CL 103 07/19/2014 0505   CO2 25 07/19/2014 0505   CO2 28 06/11/2014 1314   BUN 49* 07/19/2014 0505   BUN 36.7* 06/11/2014 1314   CREATININE 1.41* 07/19/2014 0505    CREATININE 1.4* 06/11/2014 1314      Component Value Date/Time   CALCIUM 8.5* 07/19/2014 0505   CALCIUM 10.1 06/11/2014 1314   ALKPHOS 72 07/18/2014 0412   ALKPHOS 61 06/11/2014 1314   AST 29 07/18/2014 0412   AST 14 06/11/2014 1314   ALT 26 07/18/2014 0412   ALT 11 06/11/2014 1314   BILITOT 0.6 07/18/2014 0412   BILITOT 0.41 06/11/2014 1314         Results for REVIS, WHALIN (MRN [id number]) as of 07/24/2014 12:14  Ref. Range 02/19/2014 14:24 04/23/2014 07:00 06/11/2014 13:14  PSA Latest Ref Range: <=4.00 ng/mL 11.13 (H) 6.53 (H) 9.27 (H)        Impression and Plan:  81 year old gentleman with the following issues:   1.  Castration resistant prostate cancer with metastatic disease to the bone. He started on xtandi in September 2014 and have required dose reduction and her options since that time. He is currently on reduced dose Xtandi at 40 mg daily starting April 2016. He have tolerated dose reduction well. His PSA is currently pending. The plan is to continue with the same dose and schedule if his PSA remains under control. We develop rapidly rising PSA, we'll consider salvage therapy was Zytiga.  2. Androgen depravation: He is to continue Lupron at this time. He was recently given at [facility]  3. Bone directed therapy: I recommend him to continue Xgeva which she has been getting on a monthly basis. This is given at [facility].  4. Hypercalcemia: His calcium level is pending today but elevated on previous testing.  5. Bony metastasis: He have received radiation therapy to the lumbar spine as well as the right femur.  6. Pulmonary embolism: He is currently anticoagulated with Coumadin and bridging Lovenox. He did have an episode of hemoptysis that have resolved now.  7. Followup: Will be in 08/2014.  [facility], MD 6/1/201612:13 PM     Addendum: Patient is unable to ambulate long distances outside of his home.    [facility] MD 08/07/2014

## 2014-07-25 ENCOUNTER — Encounter: Payer: Self-pay | Admitting: Cardiology

## 2014-07-25 ENCOUNTER — Telehealth: Payer: Self-pay | Admitting: *Deleted

## 2014-07-25 LAB — PSA: PSA: 5.74 ng/mL — ABNORMAL HIGH (ref <=4.00)

## 2014-07-25 NOTE — Telephone Encounter (Signed)
-----   Message from Wyatt Portela, MD sent at 07/25/2014  9:21 AM EDT ----- Please call the patient wife. PSA down and needs to continue Xtandi.

## 2014-07-25 NOTE — Telephone Encounter (Signed)
PT.'S WIFE WOULD LIKE TO PICK UP XTANDI AT 12NOON TODAY.

## 2014-07-25 NOTE — Telephone Encounter (Signed)
Per Dr. Alen Blew, I informed the patient's wife that the PSA level was 5.7 and Dr. Alen Blew would like for him to continue Xtandi. Wife verbalized understanding.

## 2014-07-26 ENCOUNTER — Ambulatory Visit (INDEPENDENT_AMBULATORY_CARE_PROVIDER_SITE_OTHER): Payer: Medicare Other | Admitting: Pharmacist

## 2014-07-26 DIAGNOSIS — I482 Chronic atrial fibrillation, unspecified: Secondary | ICD-10-CM

## 2014-07-26 DIAGNOSIS — Z5181 Encounter for therapeutic drug level monitoring: Secondary | ICD-10-CM

## 2014-07-26 LAB — POCT INR: INR: 1.7

## 2014-07-29 ENCOUNTER — Telehealth: Payer: Self-pay | Admitting: Cardiology

## 2014-07-29 NOTE — Telephone Encounter (Signed)
Spoke with Eustaquio Maize and she was wanting to get a Systems analyst for patient  Dr. Mare Ferrari unable to sign order secondary to last ov in January.  Patient does have OV 08/12/14 She will discuss with wife and make sure ok to wait until then Per Beth she did call patients oncologist since patient seen 07/24/14 and they said to contact PCP

## 2014-07-29 NOTE — Telephone Encounter (Signed)
Beth with [facility] needs to talk with you about info she needs on paperwork she faxed to you

## 2014-08-02 ENCOUNTER — Ambulatory Visit (INDEPENDENT_AMBULATORY_CARE_PROVIDER_SITE_OTHER): Payer: Medicare Other | Admitting: Pharmacist

## 2014-08-02 DIAGNOSIS — I482 Chronic atrial fibrillation, unspecified: Secondary | ICD-10-CM

## 2014-08-02 DIAGNOSIS — Z5181 Encounter for therapeutic drug level monitoring: Secondary | ICD-10-CM

## 2014-08-02 LAB — POCT INR: INR: 1.8

## 2014-08-05 ENCOUNTER — Telehealth: Payer: Self-pay | Admitting: Cardiology

## 2014-08-05 ENCOUNTER — Telehealth: Payer: Self-pay | Admitting: *Deleted

## 2014-08-05 NOTE — Telephone Encounter (Signed)
WOULD DR.SHADAD BE WILLING TO SIGN FOR A TRANSPORT WHEELCHAIR FOR PT SINCE DR.BRACKBILL IS ON VACATION. THE PT.'S WIFE HAS BEEN TALKING TO [facility]- BETH AT [phone]

## 2014-08-05 NOTE — Telephone Encounter (Signed)
New message      Calling to see if Dr Mare Ferrari has completed the order for a transport wheelchair that was faxed to our office

## 2014-08-05 NOTE — Telephone Encounter (Signed)
Informed Beth at [facility] that per Dr Sherryl Barters nurse telephone conversation from 07/29/14, Dr. Mare Ferrari was unable to sign order secondary to last ov in January, but pt has another follow-up appt with Dr Mare Ferrari 08/12/14.  Informed Beth that Dr Mare Ferrari and nurse are both out of the office for the week.  Per Beth she states that Dr Mare Ferrari could advise on the order for transport wheelchair on 6/20 at [facility].  Beth said she will make pt and wife aware of this.  Will forward this message to Dr Mare Ferrari and nurse for further follow-up on return to the office.

## 2014-08-05 NOTE — Telephone Encounter (Signed)
I want him to have that transport wheelchair.  Ask home health nurse to send [place] another request.

## 2014-08-06 ENCOUNTER — Encounter: Payer: Self-pay | Admitting: *Deleted

## 2014-08-06 NOTE — Telephone Encounter (Signed)
Craig [name] with [facility] will send request. Will send this message to [facility] his nurse also.

## 2014-08-06 NOTE — Telephone Encounter (Signed)
Left message to call back  

## 2014-08-06 NOTE — Telephone Encounter (Signed)
Called patient's wife to get a phone number to reach someone at [facility]. Spoke with molly and [employer]. Faxed signed script for w/c to heather, along with last office visit note from June 1. 2016.

## 2014-08-07 ENCOUNTER — Encounter: Payer: Self-pay | Admitting: *Deleted

## 2014-08-07 NOTE — Progress Notes (Signed)
Faxed addendum note to beth at [facility] [phone] to help with getting coverage for patient's wheel chair.

## 2014-08-09 ENCOUNTER — Ambulatory Visit (INDEPENDENT_AMBULATORY_CARE_PROVIDER_SITE_OTHER): Payer: Medicare Other | Admitting: Cardiology

## 2014-08-09 DIAGNOSIS — I482 Chronic atrial fibrillation, unspecified: Secondary | ICD-10-CM

## 2014-08-09 DIAGNOSIS — Z5181 Encounter for therapeutic drug level monitoring: Secondary | ICD-10-CM

## 2014-08-09 LAB — POCT INR: INR: 1.9

## 2014-08-12 ENCOUNTER — Other Ambulatory Visit: Payer: Self-pay | Admitting: Cardiology

## 2014-08-12 ENCOUNTER — Ambulatory Visit (INDEPENDENT_AMBULATORY_CARE_PROVIDER_SITE_OTHER): Payer: Medicare Other | Admitting: Cardiology

## 2014-08-12 ENCOUNTER — Telehealth: Payer: Self-pay | Admitting: *Deleted

## 2014-08-12 ENCOUNTER — Encounter: Payer: Self-pay | Admitting: Cardiology

## 2014-08-12 VITALS — BP 120/70 | HR 59 | Ht 72.0 in | Wt 195.0 lb

## 2014-08-12 DIAGNOSIS — I482 Chronic atrial fibrillation, unspecified: Secondary | ICD-10-CM

## 2014-08-12 DIAGNOSIS — I119 Hypertensive heart disease without heart failure: Secondary | ICD-10-CM

## 2014-08-12 DIAGNOSIS — I2699 Other pulmonary embolism without acute cor pulmonale: Secondary | ICD-10-CM

## 2014-08-12 DIAGNOSIS — N184 Chronic kidney disease, stage 4 (severe): Secondary | ICD-10-CM

## 2014-08-12 LAB — CBC WITH DIFFERENTIAL/PLATELET
Basophils Absolute: 0 10*3/uL (ref 0.0–0.1)
Basophils Relative: 0.5 % (ref 0.0–3.0)
Eosinophils Absolute: 0.4 10*3/uL (ref 0.0–0.7)
Eosinophils Relative: 6.4 % — ABNORMAL HIGH (ref 0.0–5.0)
HCT: 37.5 % — ABNORMAL LOW (ref 39.0–52.0)
HEMOGLOBIN: 12.4 g/dL — AB (ref 13.0–17.0)
Lymphocytes Relative: 25.7 % (ref 12.0–46.0)
Lymphs Abs: 1.7 10*3/uL (ref 0.7–4.0)
MCHC: 32.9 g/dL (ref 30.0–36.0)
MCV: 96.7 fl (ref 78.0–100.0)
Monocytes Absolute: 0.7 10*3/uL (ref 0.1–1.0)
Monocytes Relative: 9.7 % (ref 3.0–12.0)
NEUTROS ABS: 3.9 10*3/uL (ref 1.4–7.7)
NEUTROS PCT: 57.7 % (ref 43.0–77.0)
Platelets: 150 10*3/uL (ref 150.0–400.0)
RBC: 3.88 Mil/uL — ABNORMAL LOW (ref 4.22–5.81)
RDW: 16.6 % — ABNORMAL HIGH (ref 11.5–15.5)
WBC: 6.7 10*3/uL (ref 4.0–10.5)

## 2014-08-12 LAB — BASIC METABOLIC PANEL
BUN: 34 mg/dL — AB (ref 6–23)
CHLORIDE: 105 meq/L (ref 96–112)
CO2: 30 mEq/L (ref 19–32)
Calcium: 9.7 mg/dL (ref 8.4–10.5)
Creatinine, Ser: 1.36 mg/dL (ref 0.40–1.50)
GFR: 52.6 mL/min — ABNORMAL LOW (ref >60.00)
Glucose, Bld: 94 mg/dL (ref 70–99)
Potassium: 4 mEq/L (ref 3.5–5.1)
Sodium: 139 mEq/L (ref 135–145)

## 2014-08-12 MED ORDER — AMLODIPINE BESYLATE 5 MG PO TABS: 5.0000 mg | ORAL_TABLET | Freq: Every day | ORAL | Status: DC

## 2014-08-12 NOTE — Patient Instructions (Signed)
Medication Instructions:  Your physician recommends that you continue on your current medications as directed. Please refer to the Current Medication list given to you today.  Labwork: bmet/cbc  Testing/Procedures: none  Follow-Up: Your physician recommends that you schedule a follow-up appointment in: 2 month ov/bmet

## 2014-08-12 NOTE — Telephone Encounter (Signed)
Advised wife of labs 

## 2014-08-12 NOTE — Telephone Encounter (Signed)
-----   Message from [name], MD sent at 08/12/2014  5:15 PM EDT ----- Please report to patient.  The recent labs are stable. Continue same medication and careful diet. BUN better

## 2014-08-12 NOTE — Progress Notes (Signed)
Cardiology Office Note   Date:  08/12/2014   ID:  Craig [name]., DOB [birth year], MRN [id number]  PCP:  [name], MD  Cardiologist: [name] MD  No chief complaint on file.     History of Present Illness: Craig [name]. is a 81 y.o. male who presents for scheduled follow-up office visit.  81 y.o. male with history of metastatic prostate cancer, chronic kidney disease stage III, chronic diastolic CHF, hypertension, chronic atrial fibrillation, prior strokes on Coumadin was brought to the ER on 07/11/14  after patient had coughed up blood as noticed by patient's wife. Patient's wife states that he has coughed up 3-4 times at least significant amount of frank blood. Patient did not have any chest pain or shortness of breath. In the ER CT angiogram shows chronic pulmonary embolism.  He was treated with IV heparin and then transition back to Coumadin and was discharged on Coumadin.  Coumadin was felt preferable to Lovenox because of his renal insufficiency.  Initially the home health nurse checked his INR is and now he will be coming back to the [facility] Coumadin clinic. While in the hospital he was treated with aggressive diuresis and had a flareup of his gout which responded to prednisone therapy Chest x-ray showed possible community-acquired pneumonia and he was treated empirically with oral anti-biotics. Past Medical History  Diagnosis Date  . Hyperlipidemia   . Hypokalemia   . Hypertensive heart disease   . Chronic back pain   . History of epistaxis 07/19/2002  . Personal history of long-term (current) use of anticoagulants   . ICH (intracerebral hemorrhage) ~ 1999    after TPA/notes 09/27/2012  . Hypertension   . Atrial fibrillation   . PAT (paroxysmal atrial tachycardia)   . Pneumonia     "once; several years ago" (09/27/2012)  . GERD (gastroesophageal reflux disease)   . H/O hiatal hernia   . Migraines     "migraines without headaches years ago"  (09/27/2012)  . Stroke ~ 1999    ischemic / right cerebellar/posterior inferior cerebellar artery / right pos infarct  . Melanoma     "top of my head" (09/27/2012)  . Prostate cancer, primary, with metastasis from prostate to other site     on Lupron injections per GU  . Osteoarthritis of both feet   . S/P radiation therapy 02/13/14-02/27/14    SRS 5/5 spine completed 02/27/14  . S/P radiation therapy 05/27/14-05/31/14    right femur 20Gy/102fx    Past Surgical History  Procedure Laterality Date  . Nasal sinus surgery  1998  . Cataract extraction w/ intraocular lens  implant, bilateral Bilateral ~ 2011  . Skin graft Right 2010  . Melanoma excision  2010    "pre-melanoma on top of head; did skin graft from left thigh to cover" (09/27/2012)  . Tonsillectomy  ~ 1935     Current Outpatient Prescriptions  Medication Sig Dispense Refill  . acetaminophen (TYLENOL) 500 MG tablet Take 500 mg by mouth See admin instructions. Takes two tablets in the morning and two tablets at night    . amLODipine (NORVASC) 5 MG tablet Take 1 tablet (5 mg total) by mouth daily. 90 tablet 1  . atorvastatin (LIPITOR) 10 MG tablet Take 1 tablet (10 mg total) by mouth daily. 90 tablet 11  . B Complex-C (B-COMPLEX WITH VITAMIN C) tablet Take 1 tablet by mouth daily.      . benzonatate (TESSALON) 200 MG capsule Take  1 capsule (200 mg total) by mouth 3 (three) times daily as needed for cough. 20 capsule 0  . Denosumab (XGEVA [place]) Inject into the skin every 30 (thirty) days. Monthly. Last inj was in April 2016. Wife not sure what dose it is    . enzalutamide (XTANDI) 40 MG capsule Take 40 mg by mouth daily.    . furosemide (LASIX) 40 MG tablet Take 1 tablet (40 mg total) by mouth daily. 90 tablet 3  . Leuprolide Acetate (LUPRON DEPOT IM) Inject 1 each into the muscle every 6 (six) months. Last inj was in March 2016    . losartan (COZAAR) 100 MG tablet Take 1 tablet (100 mg total) by mouth daily. 30 tablet 2  . multivitamin  (THERAGRAN) per tablet Take 1 tablet by mouth daily. ( with B - Complex )    . Omega-3 Fatty Acids (FISH OIL PO) Take 1 capsule by mouth daily. ( Mega Red )    . tamsulosin (FLOMAX) 0.4 MG CAPS capsule Take 1 capsule (0.4 mg total) by mouth daily. 30 capsule 2  . warfarin (COUMADIN) 5 MG tablet Take as directed by Coumadin clinic 30 tablet 2  . XTANDI 40 MG capsule TAKE 2 CAPSULES BY MOUTH DAILY 60 capsule 0  . ZETIA 10 MG tablet TAKE 1 TABLET BY MOUTH ONCE DAILY 30 tablet 5   No current facility-administered medications for this visit.    Allergies:   Pravachol and Zocor    Social History:  The patient  reports that he quit smoking about 61 years ago. His smoking use included Cigarettes. He quit after .5 years of use. He has never used smokeless tobacco. He reports that he drinks alcohol. He reports that he does not use illicit drugs.   Family History:  The patient's family history includes Heart attack in his father; Heart failure in his mother.    ROS:  Please see the history of present illness.   Otherwise, review of systems are positive for none.   All other systems are reviewed and negative.    PHYSICAL EXAM: VS:  BP 120/70 mmHg  Pulse 59  Ht 6' (1.829 m)  Wt 195 lb (88.451 kg)  BMI 26.44 kg/m2 , BMI Body mass index is 26.44 kg/(m^2). GEN: Well nourished, well developed, in no acute distress HEENT: normal Neck: no JVD, carotid bruits, or masses Cardiac: Atrial fibrillation with controlled ventricular response no  rubs, or gallops, there is a grade 2/6 systolic ejection murmur at the aortic area.  There is chronic soft pitting ankle edema. Respiratory:  clear to auscultation bilaterally, normal work of breathing GI: soft, nontender, nondistended, + BS MS: no deformity or atrophy.  Travels by wheelchair. Skin: warm and dry, no rash Neuro:  Strength and sensation are intact Psych: euthymic mood, full affect   EKG:  EKG is ordered today. The ekg ordered today demonstrates  trivial fibrillation with slow ventricular response.  Since prior tracing of 04/19/14, right axis deviation is present.   Recent Labs: 02/08/2014: Pro B Natriuretic peptide (BNP) 6362.0* 07/24/2014: ALT 42; BUN 42.9*; Creatinine 1.5*; HGB 12.4*; Platelets 306; Potassium 4.1; Sodium 140    Lipid Panel    Component Value Date/Time   CHOL 130 04/21/2014 0512   TRIG 97 04/21/2014 0512   HDL 41 04/21/2014 0512   CHOLHDL 3.2 04/21/2014 0512   VLDL 19 04/21/2014 0512   LDLCALC 70 04/21/2014 0512      Wt Readings from Last 3 Encounters:  08/12/14 195  lb (88.451 kg)  07/14/14 181 lb 9.6 oz (82.373 kg)  04/19/14 184 lb 11.2 oz (83.779 kg)        ASSESSMENT AND PLAN: 1.  Chronic atrial fibrillation.  On long-term Coumadin. 2.  Essential hypertension 3.  Chronic kidney disease stage III.  Has long-term Foley catheter managed by urology 4.  Chronic diastolic heart failure 5.  Metastatic prostate cancer, on Xtandi.  Dr. Osker Mason is his oncologist  6.  Recent hospitalization for hemoptysis and questionable acute on chronic pulmonary embolus.  Continue warfarin  Plan: Continue current therapy.  We are checking lab work today.  Recheck in 2 months for follow-up office visit and basal metabolic panel   Current medicines are reviewed at length with the patient today.  The patient does not have concerns regarding medicines.  The following changes have been made:  no change  Labs/ tests ordered today include:   Orders Placed This Encounter  Procedures  . Basic metabolic panel  . CBC with Differential/Platelet  . EKG 12-Lead    Disposition: Recheck in 2 months for office visit and basal metabolic panel  Signed, [name] MD 08/12/2014 11:47 AM    [address], [place],   [address] Phone: [phone]; Fax: [phone]

## 2014-08-12 NOTE — Progress Notes (Signed)
Quick Note:  Please report to patient. The recent labs are stable. Continue same medication and careful diet. BUN better ______

## 2014-08-12 NOTE — Telephone Encounter (Signed)
Asking for Dr.Brackbill to take over Amlodipine 5 mg, please advise

## 2014-08-13 NOTE — Telephone Encounter (Signed)
Spoke with Craig [name] and this order has been signed by oncologist and taken care of

## 2014-08-17 ENCOUNTER — Other Ambulatory Visit: Payer: Self-pay | Admitting: Cardiology

## 2014-08-23 ENCOUNTER — Ambulatory Visit (INDEPENDENT_AMBULATORY_CARE_PROVIDER_SITE_OTHER): Payer: Medicare Other

## 2014-08-23 DIAGNOSIS — I4891 Unspecified atrial fibrillation: Secondary | ICD-10-CM | POA: Diagnosis not present

## 2014-08-23 DIAGNOSIS — Z5181 Encounter for therapeutic drug level monitoring: Secondary | ICD-10-CM | POA: Diagnosis not present

## 2014-08-23 DIAGNOSIS — I482 Chronic atrial fibrillation, unspecified: Secondary | ICD-10-CM

## 2014-08-23 LAB — POCT INR: INR: 1.6

## 2014-09-09 ENCOUNTER — Ambulatory Visit (INDEPENDENT_AMBULATORY_CARE_PROVIDER_SITE_OTHER): Payer: Medicare Other

## 2014-09-09 DIAGNOSIS — Z5181 Encounter for therapeutic drug level monitoring: Secondary | ICD-10-CM

## 2014-09-09 DIAGNOSIS — I482 Chronic atrial fibrillation, unspecified: Secondary | ICD-10-CM

## 2014-09-09 DIAGNOSIS — I4891 Unspecified atrial fibrillation: Secondary | ICD-10-CM

## 2014-09-09 LAB — POCT INR: INR: 1.9

## 2014-09-10 ENCOUNTER — Ambulatory Visit (HOSPITAL_BASED_OUTPATIENT_CLINIC_OR_DEPARTMENT_OTHER): Payer: Medicare Other | Admitting: Oncology

## 2014-09-10 ENCOUNTER — Telehealth: Payer: Self-pay | Admitting: Oncology

## 2014-09-10 ENCOUNTER — Other Ambulatory Visit (HOSPITAL_BASED_OUTPATIENT_CLINIC_OR_DEPARTMENT_OTHER): Payer: Medicare Other

## 2014-09-10 VITALS — BP 143/70 | HR 73 | Temp 98.1°F | Resp 16 | Ht 72.0 in

## 2014-09-10 DIAGNOSIS — C7951 Secondary malignant neoplasm of bone: Secondary | ICD-10-CM

## 2014-09-10 DIAGNOSIS — C61 Malignant neoplasm of prostate: Secondary | ICD-10-CM

## 2014-09-10 DIAGNOSIS — E291 Testicular hypofunction: Secondary | ICD-10-CM

## 2014-09-10 DIAGNOSIS — I4891 Unspecified atrial fibrillation: Secondary | ICD-10-CM | POA: Diagnosis not present

## 2014-09-10 DIAGNOSIS — I2699 Other pulmonary embolism without acute cor pulmonale: Secondary | ICD-10-CM

## 2014-09-10 LAB — COMPREHENSIVE METABOLIC PANEL (CC13)
ALT: 9 U/L (ref 0–55)
ANION GAP: 7 meq/L (ref 3–11)
AST: 15 U/L (ref 5–34)
Albumin: 3.1 g/dL — ABNORMAL LOW (ref 3.5–5.0)
Alkaline Phosphatase: 56 U/L (ref 40–150)
BILIRUBIN TOTAL: 0.51 mg/dL (ref 0.20–1.20)
BUN: 35.8 mg/dL — AB (ref 7.0–26.0)
CALCIUM: 9.9 mg/dL (ref 8.4–10.4)
CHLORIDE: 106 meq/L (ref 98–109)
CO2: 29 mEq/L (ref 22–29)
Creatinine: 1.4 mg/dL — ABNORMAL HIGH (ref 0.7–1.3)
EGFR: 45 mL/min/{1.73_m2} — ABNORMAL LOW (ref >90)
Glucose: 122 mg/dl (ref 70–140)
Potassium: 4.3 mEq/L (ref 3.5–5.1)
Sodium: 142 mEq/L (ref 136–145)
Total Protein: 6.3 g/dL — ABNORMAL LOW (ref 6.4–8.3)

## 2014-09-10 LAB — CBC WITH DIFFERENTIAL/PLATELET
BASO%: 0.5 % (ref 0.0–2.0)
Basophils Absolute: 0 10*3/uL (ref 0.0–0.1)
EOS%: 4.1 % (ref 0.0–7.0)
Eosinophils Absolute: 0.3 10*3/uL (ref 0.0–0.5)
HEMATOCRIT: 39.4 % (ref 38.4–49.9)
HGB: 13.1 g/dL (ref 13.0–17.1)
LYMPH%: 20.6 % (ref 14.0–49.0)
MCH: 32.1 pg (ref 27.2–33.4)
MCHC: 33.1 g/dL (ref 32.0–36.0)
MCV: 97 fL (ref 79.3–98.0)
MONO#: 0.7 10*3/uL (ref 0.1–0.9)
MONO%: 10.2 % (ref 0.0–14.0)
NEUT#: 4.5 10*3/uL (ref 1.5–6.5)
NEUT%: 64.6 % (ref 39.0–75.0)
Platelets: 167 10*3/uL (ref 140–400)
RBC: 4.06 10*6/uL — ABNORMAL LOW (ref 4.20–5.82)
RDW: 15.6 % — ABNORMAL HIGH (ref 11.0–14.6)
WBC: 7 10*3/uL (ref 4.0–10.3)
lymph#: 1.4 10*3/uL (ref 0.9–3.3)

## 2014-09-10 NOTE — Telephone Encounter (Signed)
Gave and printed appt sched and avs for pt for AUg  °

## 2014-09-10 NOTE — Progress Notes (Signed)
Hematology and Oncology Follow Up Visit  Craig [name] [id number] 06-02-1924 81 y.o. 09/10/2014 11:56 AM [name], MDBrackbill, [name], MD   Principle Diagnosis: 81 year old with Castration resistant prostate cancer with metastatic disease to the bone. He was initially diagnosed in 2011 PSA of 19 and presented with advanced disease.   Prior Therapy: He is status post combined androgen deprivation with Lupron and Casodex with an excellent response initially and had a PSA nadir down to 1.67. Most recently he developed progression of disease and a PSA up to 5.94 in September of 2014. He is status post SRS treatment to the L4 completed on 02/13/2014. He is S/P stereotactic body radiotherapy to the iliac bone treatment completed in 02/2014. He is status post radiation therapy to the right femur completed on 06/07/2014.  Current therapy: He is on Xtandi started on 11/15/2012. He was started on 160 mg initially but the dose was reduced to 80 mg in December of 2014. Treatment has been on hold for 2 months. Xtandi resumed at 40 mg daily starting on 06/12/2014. He continues to be on Lupron and Xgeva done at [facility]. .  Interim History: [name] presents today for a followup visit with his wife. Since the last visit, he continues to be relatively stable. He did not have any recent hospitalizations or complications. He tolerated Xtandi of the current dose without any complications.  He did not have any episodic weakness or lower extremity swelling. He continues to be in atrial fibrillation but his rate has been controlled.  He does not report any increase bone pain or back pain. Has not reported any pathological fractures or falls. He continues to have catheter in place which have helped in his quality of life. Has not reported any cough or hemoptysis or hematemesis. Does not report any nausea or vomiting or abdominal pain.Is not reporting any back pain or neurological symptoms. Does  not report any headaches or blurry vision or double vision. Is not reporting any chest pain shortness of breath or difficulty breathing. Does not report any constipation or diarrhea. Does not report any hematochezia or melena. Does not report any skin rashes or lesions. Rest of his review of system is unremarkable.  Medications: I have reviewed the patient's current medications.  Current Outpatient Prescriptions  Medication Sig Dispense Refill  . acetaminophen (TYLENOL) 500 MG tablet Take 500 mg by mouth See admin instructions. Takes two tablets in the morning and two tablets at night    . amLODipine (NORVASC) 5 MG tablet Take 1 tablet (5 mg total) by mouth daily. 90 tablet 3  . atorvastatin (LIPITOR) 10 MG tablet Take 1 tablet (10 mg total) by mouth daily. 90 tablet 11  . B Complex-C (B-COMPLEX WITH VITAMIN C) tablet Take 1 tablet by mouth daily.      . benzonatate (TESSALON) 200 MG capsule Take 1 capsule (200 mg total) by mouth 3 (three) times daily as needed for cough. 20 capsule 0  . Denosumab (XGEVA [place]) Inject into the skin every 30 (thirty) days. Monthly. Last inj was in April 2016. Wife not sure what dose it is    . docusate sodium (COLACE) 100 MG capsule Take 100 mg by mouth 2 (two) times daily.    . enzalutamide (XTANDI) 40 MG capsule Take 40 mg by mouth daily.    . furosemide (LASIX) 40 MG tablet Take 1 tablet (40 mg total) by mouth daily. 90 tablet 3  . Leuprolide Acetate (LUPRON DEPOT IM)  Inject 1 each into the muscle every 6 (six) months. Last inj was in March 2016    . losartan (COZAAR) 100 MG tablet TAKE 1 TABLET BY MOUTH ONCE DAILY 30 tablet 11  . multivitamin (THERAGRAN) per tablet Take 1 tablet by mouth daily. ( with B - Complex )    . Omega-3 Fatty Acids (FISH OIL PO) Take 1 capsule by mouth daily. ( Mega Red )    . tamsulosin (FLOMAX) 0.4 MG CAPS capsule Take 1 capsule (0.4 mg total) by mouth daily. 30 capsule 2  . warfarin (COUMADIN) 5 MG tablet Take as directed by Coumadin  clinic 30 tablet 2  . ZETIA 10 MG tablet TAKE 1 TABLET BY MOUTH DAILY 30 tablet 5   No current facility-administered medications for this visit.     Allergies:  Allergies  Allergen Reactions  . Pravachol Other (See Comments)    Muscle weakness  . Zocor [Simvastatin - High Dose] Other (See Comments)    Muscle weakness    Past Medical History, Surgical history, Social history, and Family History were reviewed and updated.   Physical Exam: Blood pressure 143/70, pulse 73, temperature 98.1 F (36.7 C), temperature source Oral, resp. rate 16, height 6' (1.829 m), SpO2 99 %. ECOG: 2 General appearance: alert, awake elderly gentleman not in any distress. Head: Normocephalic, without obvious abnormality Neck: no adenopathy Lymph nodes: Cervical, supraclavicular, and axillary nodes normal. Heart:  Irregular rhythm without any murmurs or gallops. Lung:chest clear, no wheezing, rales, no dullness to percussion. Abdomin: soft, non-tender, without masses or organomegaly. Good bowel sounds. EXT: Trace edema noted bilaterally on top of the foot. Neurological examination: No deficits noted.  Lab Results: Lab Results  Component Value Date   WBC 7.0 09/10/2014   HGB 13.1 09/10/2014   HCT 39.4 09/10/2014   MCV 97.0 09/10/2014   PLT 167 09/10/2014     Chemistry      Component Value Date/Time   NA 139 08/12/2014 1006   NA 140 07/24/2014 1145   K 4.0 08/12/2014 1006   K 4.1 07/24/2014 1145   CL 105 08/12/2014 1006   CO2 30 08/12/2014 1006   CO2 30* 07/24/2014 1145   BUN 34* 08/12/2014 1006   BUN 42.9* 07/24/2014 1145   CREATININE 1.36 08/12/2014 1006   CREATININE 1.5* 07/24/2014 1145      Component Value Date/Time   CALCIUM 9.7 08/12/2014 1006   CALCIUM 10.8* 07/24/2014 1145   ALKPHOS 69 07/24/2014 1145   ALKPHOS 72 07/18/2014 0412   AST 20 07/24/2014 1145   AST 29 07/18/2014 0412   ALT 42 07/24/2014 1145   ALT 26 07/18/2014 0412   BILITOT 0.40 07/24/2014 1145   BILITOT  0.6 07/18/2014 0412        Results for Craig [name], Craig [name] (MRN [id number]) as of 09/10/2014 11:46  Ref. Range 06/11/2014 13:14 07/24/2014 11:45  PSA Latest Ref Range: <=4.00 ng/mL 9.27 (H) 5.74 (H)          Impression and Plan:  81 year old gentleman with the following issues:   1. Castration resistant prostate cancer with metastatic disease to the bone. He started on xtandi in September 2014 and have required dose reduction and her options since that time. He is currently on reduced dose Xtandi at 40 mg daily starting April 2016. He have tolerated dose reduction well with PSA dropping slightly to 5.74 in June 2016. The plan is to continue with the same dose and schedule and use a different  salvage therapy upon symptomatic progression.  2. Androgen depravation: He is to continue Lupron at this time. He was recently given at [facility]  3. Bone directed therapy: I recommend him to continue Xgeva which she has been getting on a monthly basis. This is given at [facility].  4. Hypercalcemia: His calcium level is pending today but elevated on previous testing.  5. Bony metastasis: He have received radiation therapy to the lumbar spine as well as the right femur.  This can be repeated due to other painful spots in the future.  6. Pulmonary embolism: He is currently anticoagulated with Coumadin and bridging Lovenox. He did have an episode of hemoptysis that have resolved now.  7. Atrial fibrillation: his weight is controlled and followed by Dr. Mare Ferrari.  8. Followup: Will be in  6 weeks.  [facility], MD 7/19/201611:56 AM

## 2014-09-11 ENCOUNTER — Telehealth: Payer: Self-pay | Admitting: *Deleted

## 2014-09-11 LAB — PSA: PSA: 3.89 ng/mL (ref <=4.00)

## 2014-09-11 NOTE — Telephone Encounter (Signed)
TC from pt's wife requesting results of yesterday's lab work-specifically PSA and BUN/Creatinine. Informed wife of results and she voiced understanding and glad of lower PSA.  No other needs identified.

## 2014-09-11 NOTE — Telephone Encounter (Signed)
Per Dr. Alen Blew, I informed Mrs. [name] that PSA was 3.89. Wife verbalized understanding.

## 2014-09-11 NOTE — Telephone Encounter (Signed)
-----   Message from Wyatt Portela, MD sent at 09/11/2014  8:59 AM EDT ----- Please call his PSA.

## 2014-09-14 ENCOUNTER — Other Ambulatory Visit: Payer: Self-pay | Admitting: Cardiology

## 2014-09-17 ENCOUNTER — Other Ambulatory Visit: Payer: Self-pay

## 2014-09-17 DIAGNOSIS — E785 Hyperlipidemia, unspecified: Secondary | ICD-10-CM

## 2014-09-19 ENCOUNTER — Telehealth: Payer: Self-pay

## 2014-09-19 ENCOUNTER — Other Ambulatory Visit: Payer: Self-pay | Admitting: *Deleted

## 2014-09-19 DIAGNOSIS — C61 Malignant neoplasm of prostate: Secondary | ICD-10-CM

## 2014-09-19 DIAGNOSIS — C7951 Secondary malignant neoplasm of bone: Secondary | ICD-10-CM

## 2014-09-19 MED ORDER — ENZALUTAMIDE 40 MG PO CAPS: 40.0000 mg | ORAL_CAPSULE | Freq: Every day | ORAL | Status: DC

## 2014-09-19 NOTE — Telephone Encounter (Signed)
Wife called requesting refill of Xtandi be sent to [facility] outpatient pharmacy for pickup tomorrow. No refill request in MD folder or noted in echart.

## 2014-09-20 ENCOUNTER — Other Ambulatory Visit: Payer: Self-pay | Admitting: Cardiology

## 2014-09-23 ENCOUNTER — Telehealth: Payer: Self-pay | Admitting: *Deleted

## 2014-09-23 NOTE — Telephone Encounter (Signed)
Spoke with Craig [name], re: holding xtandi. Yes, per dr Alen Blew, hold xtandi. She will call us at the end of the week to report patient's progress.

## 2014-09-23 NOTE — Telephone Encounter (Signed)
Wife betty calling, states patient has been on 1 tablet of xtandi a day, for 14 and 1/2 weeks. Having more leg weakness. Unable to stand. Collapsed in the bathroom Sunday and paramedics had to be called to assist patient back to bed. Increased discomfort in right thigh. No pain when patient is in bed. Only when ambulating, betty wonders, if she should hold the xtandi again, to see if patient improves?

## 2014-09-27 ENCOUNTER — Telehealth: Payer: Self-pay | Admitting: *Deleted

## 2014-09-27 NOTE — Telephone Encounter (Signed)
Pt wife called to give report on pt since holding xtandi. Pt has regained some strength in his legs. He has been able to stand and walk short distances. Pt wife would like confirmation to restart Xtandi on Monday. Please advise/call to confirm.

## 2014-09-27 NOTE — Telephone Encounter (Signed)
This RN spoke with wife, [name], and informed her that per Dr. Alen Blew, it's OK to restart the Xtandi on Monday, 09/30/14. She verbalized understanding.

## 2014-09-30 ENCOUNTER — Telehealth: Payer: Self-pay | Admitting: *Deleted

## 2014-09-30 ENCOUNTER — Ambulatory Visit (INDEPENDENT_AMBULATORY_CARE_PROVIDER_SITE_OTHER): Payer: Medicare Other | Admitting: *Deleted

## 2014-09-30 ENCOUNTER — Other Ambulatory Visit: Payer: Self-pay | Admitting: Oncology

## 2014-09-30 DIAGNOSIS — C61 Malignant neoplasm of prostate: Secondary | ICD-10-CM

## 2014-09-30 DIAGNOSIS — I4891 Unspecified atrial fibrillation: Secondary | ICD-10-CM

## 2014-09-30 DIAGNOSIS — Z5181 Encounter for therapeutic drug level monitoring: Secondary | ICD-10-CM

## 2014-09-30 DIAGNOSIS — I482 Chronic atrial fibrillation, unspecified: Secondary | ICD-10-CM

## 2014-09-30 DIAGNOSIS — C7951 Secondary malignant neoplasm of bone: Principal | ICD-10-CM

## 2014-09-30 LAB — POCT INR: INR: 1.8

## 2014-09-30 NOTE — Telephone Encounter (Signed)
TC to pt's wife and informed her that a bone scan has been ordered and to expect to hear from scheduler in the next few days.  She voiced understanding. She again states that his legs are very weak and the pain has gotten worse in his lower back. He does get relief from current pain meds.

## 2014-09-30 NOTE — Telephone Encounter (Signed)
Bone scan will be ordered before his next visit.

## 2014-09-30 NOTE — Telephone Encounter (Signed)
VM message from pt's wife received @ 9:38 am.  She states that she will be holding off on re-starting Xtandi for another couple of days-pt's legs are better strength-wise,b ut she wants to give him a couple of more days off the [place].  Also she is asking if pt needs another CT scan or bone scan as he is experiencing increased back pain and right thigh/pelvis pain.  His last scan was on 04/22/14.  Pt has had XRT to this area but now pain has increased.  Please advise.

## 2014-10-07 ENCOUNTER — Encounter: Payer: Self-pay | Admitting: *Deleted

## 2014-10-08 ENCOUNTER — Telehealth: Payer: Self-pay | Admitting: *Deleted

## 2014-10-08 NOTE — Telephone Encounter (Signed)
Lm on vm that letter of necessity for ambulance transportation, is at front for p/u

## 2014-10-11 ENCOUNTER — Encounter (HOSPITAL_COMMUNITY)
Admission: RE | Admit: 2014-10-11 | Discharge: 2014-10-11 | Disposition: A | Discharge status: Home / Self Care | Payer: Medicare Other | Source: Ambulatory Visit | Attending: Oncology | Admitting: Oncology

## 2014-10-11 DIAGNOSIS — C7951 Secondary malignant neoplasm of bone: Secondary | ICD-10-CM | POA: Insufficient documentation

## 2014-10-11 DIAGNOSIS — C61 Malignant neoplasm of prostate: Secondary | ICD-10-CM

## 2014-10-11 MED ORDER — TECHNETIUM TC 99M MEDRONATE IV KIT
26.6000 | PACK | Freq: Once | INTRAVENOUS | Status: AC | PRN
  Administered 2014-10-11: 26.6 via INTRAVENOUS

## 2014-10-14 ENCOUNTER — Telehealth: Payer: Self-pay | Admitting: *Deleted

## 2014-10-14 NOTE — Telephone Encounter (Signed)
"  I'm calling for results of Craig [name]'s bone scan he had on Friday.  No one's called results and I left a message earlier today with his nurse."  Expressed that Dr. Alen Blew is off today but will notify him of this request.  "They can call me in the morning [phone].  I will be taking him to another appointment at 12:15 so call needs to be in the morning."

## 2014-10-15 ENCOUNTER — Encounter: Payer: Self-pay | Admitting: Cardiology

## 2014-10-15 ENCOUNTER — Ambulatory Visit (INDEPENDENT_AMBULATORY_CARE_PROVIDER_SITE_OTHER): Payer: Medicare Other | Admitting: Cardiology

## 2014-10-15 ENCOUNTER — Ambulatory Visit (INDEPENDENT_AMBULATORY_CARE_PROVIDER_SITE_OTHER): Payer: Medicare Other | Admitting: *Deleted

## 2014-10-15 VITALS — BP 138/60 | HR 73 | Ht 72.0 in

## 2014-10-15 DIAGNOSIS — I482 Chronic atrial fibrillation, unspecified: Secondary | ICD-10-CM

## 2014-10-15 DIAGNOSIS — I119 Hypertensive heart disease without heart failure: Secondary | ICD-10-CM

## 2014-10-15 DIAGNOSIS — I4891 Unspecified atrial fibrillation: Secondary | ICD-10-CM

## 2014-10-15 DIAGNOSIS — Z5181 Encounter for therapeutic drug level monitoring: Secondary | ICD-10-CM

## 2014-10-15 LAB — POCT INR: INR: 1.8

## 2014-10-15 NOTE — Patient Instructions (Signed)
Medication Instructions:  Your physician recommends that you continue on your current medications as directed. Please refer to the Current Medication list given to you today.  Labwork: none  Testing/Procedures: none  Follow-Up: Your physician recommends that you schedule a follow-up appointment in: 2 month ov

## 2014-10-15 NOTE — Progress Notes (Signed)
Cardiology Office Note   Date:  10/15/2014   ID:  Craig [name]., DOB 27-Jul-1924, MRN [id number]  PCP:  Craig Danes, MD  Cardiologist: Craig Coco MD  No chief complaint on file.     History of Present Illness: Craig Zee. is a 81 y.o. male who presents for scheduled follow-up visit. 81 y.o. male with history of metastatic prostate cancer, chronic kidney disease stage III, chronic diastolic CHF, hypertension, chronic atrial fibrillation, prior strokes on Coumadin was brought to the ER on 07/11/14 after patient had coughed up blood as noticed by patient's wife. . In the ER CT angiogram shows chronic pulmonary embolism. He was treated with IV heparin and then transition back to Coumadin and was discharged on Coumadin. Coumadin was felt preferable to Lovenox because of his renal insufficiency. Initially the home health nurse checked his INR is and now he will be coming back to the [facility] Coumadin clinic. While in the hospital he was treated with aggressive diuresis and had a flareup of his gout which responded to prednisone therapy Chest x-ray showed possible community-acquired pneumonia and he was treated empirically with oral anti-biotics. Presently he is feeling well.  He does not have a productive cough.  There is no more hemoptysis.  His INR today was 1.8. He is on XTandi  for his metastatic prostate cancer.  He has to interrupted every so often because it makes his legs weak and causes increased edema.  He had a recent bone scan last week which did not show any progression of his metastatic lesions.  Past Medical History  Diagnosis Date  . Hyperlipidemia   . Hypokalemia   . Hypertensive heart disease   . Chronic back pain   . History of epistaxis 07/19/2002  . Personal history of long-term (current) use of anticoagulants   . ICH (intracerebral hemorrhage) ~ 1999    after TPA/notes 09/27/2012  . Hypertension   . Atrial fibrillation   . PAT  (paroxysmal atrial tachycardia)   . Pneumonia     "once; several years ago" (09/27/2012)  . GERD (gastroesophageal reflux disease)   . H/O hiatal hernia   . Migraines     "migraines without headaches years ago" (09/27/2012)  . Stroke ~ 1999    ischemic / right cerebellar/posterior inferior cerebellar artery / right pos infarct  . Melanoma     "top of my head" (09/27/2012)  . Prostate cancer, primary, with metastasis from prostate to other site     on Lupron injections per GU  . Osteoarthritis of both feet   . S/P radiation therapy 02/13/14-02/27/14    SRS 5/5 spine completed 02/27/14  . S/P radiation therapy 05/27/14-05/31/14    right femur 20Gy/43fx    Past Surgical History  Procedure Laterality Date  . Nasal sinus surgery  1998  . Cataract extraction w/ intraocular lens  implant, bilateral Bilateral ~ 2011  . Skin graft Right 2010  . Melanoma excision  2010    "pre-melanoma on top of head; did skin graft from left thigh to cover" (09/27/2012)  . Tonsillectomy  ~ 1935     Current Outpatient Prescriptions  Medication Sig Dispense Refill  . acetaminophen (TYLENOL) 500 MG tablet Take by mouth. Takes two tablets in the morning and two tablets at night    . amLODipine (NORVASC) 5 MG tablet Take 1 tablet (5 mg total) by mouth daily. 90 tablet 3  . atorvastatin (LIPITOR) 10 MG tablet TAKE 1 TABLET BY  MOUTH ONCE DAILY 90 tablet 0  . B Complex-C (B-COMPLEX WITH VITAMIN C) tablet Take 1 tablet by mouth daily.      . benzonatate (TESSALON) 200 MG capsule Take 1 capsule (200 mg total) by mouth 3 (three) times daily as needed for cough. 20 capsule 0  . Denosumab (XGEVA [place]) Inject into the skin every 30 (thirty) days. Monthly. Last inj was 8-19- 2016. Wife not sure what dose it is    . docusate sodium (COLACE) 100 MG capsule Take 100 mg by mouth 2 (two) times daily.    . enzalutamide (XTANDI) 40 MG capsule Take 1 capsule (40 mg total) by mouth daily. 30 capsule 0  . furosemide (LASIX) 40 MG tablet Take 1  tablet (40 mg total) by mouth daily. 90 tablet 3  . Leuprolide Acetate (LUPRON DEPOT IM) Inject 1 each into the muscle every 6 (six) months. Last inj was in March 2016    . losartan (COZAAR) 100 MG tablet TAKE 1 TABLET BY MOUTH ONCE DAILY 30 tablet 11  . multivitamin (THERAGRAN) per tablet Take 1 tablet by mouth daily. ( with B - Complex )    . Omega-3 Fatty Acids (FISH OIL PO) Take 1 capsule by mouth daily. ( Mega Red )    . tamsulosin (FLOMAX) 0.4 MG CAPS capsule Take 1 capsule (0.4 mg total) by mouth daily. 30 capsule 2  . warfarin (COUMADIN) 5 MG tablet Take as directed by Coumadin clinic 30 tablet 2  . ZETIA 10 MG tablet TAKE 1 TABLET BY MOUTH DAILY 30 tablet 5   No current facility-administered medications for this visit.    Allergies:   Pravachol and Zocor    Social History:  The patient  reports that he quit smoking about 61 years ago. His smoking use included Cigarettes. He quit after .5 years of use. He has never used smokeless tobacco. He reports that he drinks alcohol. He reports that he does not use illicit drugs.   Family History:  The patient's family history includes Heart attack in his father; Heart failure in his mother.    ROS:  Please see the history of present illness.   Otherwise, review of systems are positive for none.   All other systems are reviewed and negative.    PHYSICAL EXAM: VS:  BP 138/60 mmHg  Pulse 73  Ht 6' (1.829 m)  SpO2 97% , BMI There is no weight on file to calculate BMI. GEN: Well nourished, well developed, in no acute distress HEENT: normal Neck: no JVD, carotid bruits, or masses Cardiac: Atrial fibrillation.  Soft systolic ejection murmur at the base.  Minimal peripheral edema. Respiratory:  clear to auscultation bilaterally, normal work of breathing GI: soft, nontender, nondistended, + BS MS: no deformity or atrophy Skin: warm and dry, no rash Neuro:  Strength and sensation are intact Psych: euthymic mood, full affect   EKG:  EKG is  not ordered today.    Recent Labs: 02/08/2014: Pro B Natriuretic peptide (BNP) 6362.0* 09/10/2014: ALT 9; BUN 35.8*; Creatinine 1.4*; HGB 13.1; Platelets 167; Potassium 4.3; Sodium 142    Lipid Panel    Component Value Date/Time   CHOL 130 04/21/2014 0512   TRIG 97 04/21/2014 0512   HDL 41 04/21/2014 0512   CHOLHDL 3.2 04/21/2014 0512   VLDL 19 04/21/2014 0512   LDLCALC 70 04/21/2014 0512      Wt Readings from Last 3 Encounters:  08/12/14 195 lb (88.451 kg)  07/14/14 181 lb 9.6  oz (82.373 kg)  04/19/14 184 lb 11.2 oz (83.779 kg)        ASSESSMENT AND PLAN: 1. Chronic atrial fibrillation.   On long-term Coumadin. 2. Essential hypertension 3. Chronic kidney disease stage III. Has long-term Foley catheter managed by urology 4. Chronic diastolic heart failure 5. Metastatic prostate cancer, on Xtandi. Dr. Osker Mason is his oncologist  6. Recent hospitalization for hemoptysis and questionable acute on chronic pulmonary embolus. Continue warfarin  Plan: Continue current therapy.  Recheck in 2 months for follow-up office visit.    Current medicines are reviewed at length with the patient today.  The patient does not have concerns regarding medicines.  The following changes have been made:  no change  Labs/ tests ordered today include:  No orders of the defined types were placed in this encounter.      Berna Spare MD 10/15/2014 5:55 PM    [address], [place], [place]  [address] Phone: [phone]; Fax: [phone]

## 2014-10-19 ENCOUNTER — Other Ambulatory Visit: Payer: Self-pay | Admitting: Cardiology

## 2014-10-22 ENCOUNTER — Telehealth: Payer: Self-pay | Admitting: Oncology

## 2014-10-22 ENCOUNTER — Ambulatory Visit (HOSPITAL_BASED_OUTPATIENT_CLINIC_OR_DEPARTMENT_OTHER): Payer: Medicare Other | Admitting: Oncology

## 2014-10-22 ENCOUNTER — Other Ambulatory Visit (HOSPITAL_BASED_OUTPATIENT_CLINIC_OR_DEPARTMENT_OTHER): Payer: Medicare Other

## 2014-10-22 VITALS — BP 164/84 | HR 68 | Temp 98.0°F | Resp 18 | Ht 72.0 in

## 2014-10-22 DIAGNOSIS — I2699 Other pulmonary embolism without acute cor pulmonale: Secondary | ICD-10-CM

## 2014-10-22 DIAGNOSIS — I4891 Unspecified atrial fibrillation: Secondary | ICD-10-CM | POA: Diagnosis not present

## 2014-10-22 DIAGNOSIS — E291 Testicular hypofunction: Secondary | ICD-10-CM

## 2014-10-22 DIAGNOSIS — C61 Malignant neoplasm of prostate: Secondary | ICD-10-CM

## 2014-10-22 DIAGNOSIS — C7951 Secondary malignant neoplasm of bone: Secondary | ICD-10-CM

## 2014-10-22 DIAGNOSIS — M79604 Pain in right leg: Secondary | ICD-10-CM

## 2014-10-22 LAB — COMPREHENSIVE METABOLIC PANEL (CC13)
ALBUMIN: 3.3 g/dL — AB (ref 3.5–5.0)
ALK PHOS: 63 U/L (ref 40–150)
ALT: 16 U/L (ref 0–55)
AST: 17 U/L (ref 5–34)
Anion Gap: 8 mEq/L (ref 3–11)
BILIRUBIN TOTAL: 0.55 mg/dL (ref 0.20–1.20)
BUN: 40.5 mg/dL — ABNORMAL HIGH (ref 7.0–26.0)
CO2: 28 meq/L (ref 22–29)
CREATININE: 1.4 mg/dL — AB (ref 0.7–1.3)
Calcium: 9.9 mg/dL (ref 8.4–10.4)
Chloride: 107 mEq/L (ref 98–109)
EGFR: 45 mL/min/{1.73_m2} — AB (ref >90)
GLUCOSE: 98 mg/dL (ref 70–140)
Potassium: 4.4 mEq/L (ref 3.5–5.1)
SODIUM: 144 meq/L (ref 136–145)
TOTAL PROTEIN: 6.9 g/dL (ref 6.4–8.3)

## 2014-10-22 LAB — CBC WITH DIFFERENTIAL/PLATELET
BASO%: 0.3 % (ref 0.0–2.0)
Basophils Absolute: 0 10*3/uL (ref 0.0–0.1)
EOS ABS: 0.2 10*3/uL (ref 0.0–0.5)
EOS%: 2.8 % (ref 0.0–7.0)
HCT: 42 % (ref 38.4–49.9)
HEMOGLOBIN: 13.8 g/dL (ref 13.0–17.1)
LYMPH%: 24 % (ref 14.0–49.0)
MCH: 31.7 pg (ref 27.2–33.4)
MCHC: 32.7 g/dL (ref 32.0–36.0)
MCV: 97 fL (ref 79.3–98.0)
MONO#: 0.7 10*3/uL (ref 0.1–0.9)
MONO%: 9.5 % (ref 0.0–14.0)
NEUT%: 63.4 % (ref 39.0–75.0)
NEUTROS ABS: 4.5 10*3/uL (ref 1.5–6.5)
PLATELETS: 152 10*3/uL (ref 140–400)
RBC: 4.33 10*6/uL (ref 4.20–5.82)
RDW: 15 % — ABNORMAL HIGH (ref 11.0–14.6)
WBC: 7 10*3/uL (ref 4.0–10.3)
lymph#: 1.7 10*3/uL (ref 0.9–3.3)

## 2014-10-22 NOTE — Progress Notes (Signed)
Hematology and Oncology Follow Up Visit  Craig [name] [id number] [birth year] 81 y.o. 10/22/2014 2:42 PM [name], MDBrackbill, [name], MD   Principle Diagnosis: 81 year old with Castration resistant prostate cancer with metastatic disease to the bone. He was initially diagnosed in 2011 PSA of 19 and presented with advanced disease.   Prior Therapy: He is status post combined androgen deprivation with Lupron and Casodex with an excellent response initially and had a PSA nadir down to 1.67. Most recently he developed progression of disease and a PSA up to 5.94 in September of 2014. He is status post SRS treatment to the L4 completed on 02/13/2014. He is S/P stereotactic body radiotherapy to the iliac bone treatment completed in 02/2014. He is status post radiation therapy to the right femur completed on 06/07/2014.  Current therapy: He is on Xtandi started on 11/15/2012. He was started on 160 mg initially but the dose was reduced to 80 mg in December of 2014. Treatment has been on hold for 2 months. Xtandi resumed at 40 mg daily starting on 06/12/2014. He continues to be on Lupron and Xgeva done at [facility]. .  Interim History: [name] presents today for a followup visit with his wife. Since the last visit, he has reported some occasional weakness in his lower extremities. This weakness have improved slowly in the last week or so. He continues to be on Xtandi of the current dose without any complications. He does report increased right leg pain and right hip pain with mobility. He reports none at rest.   Has not reported any pathological fractures or falls. He continues to have catheter in place which have helped in his quality of life.  Has not reported any cough or hemoptysis or hematemesis. Does not report any nausea or vomiting or abdominal pain.Is not reporting any back pain or neurological symptoms. Does not report any headaches or blurry vision or double vision. Is  not reporting any chest pain shortness of breath or difficulty breathing. Does not report any constipation or diarrhea. Does not report any hematochezia or melena. Does not report any skin rashes or lesions. Rest of his review of system is unremarkable.  Medications: I have reviewed the patient's current medications.  Current Outpatient Prescriptions  Medication Sig Dispense Refill  . acetaminophen (TYLENOL) 500 MG tablet Take by mouth. Takes two tablets in the morning and two tablets at night    . amLODipine (NORVASC) 5 MG tablet Take 1 tablet (5 mg total) by mouth daily. 90 tablet 3  . atorvastatin (LIPITOR) 10 MG tablet TAKE 1 TABLET BY MOUTH ONCE DAILY 30 tablet 6  . B Complex-C (B-COMPLEX WITH VITAMIN C) tablet Take 1 tablet by mouth daily.      . benzonatate (TESSALON) 200 MG capsule Take 1 capsule (200 mg total) by mouth 3 (three) times daily as needed for cough. 20 capsule 0  . Denosumab (XGEVA [place]) Inject into the skin every 30 (thirty) days. Monthly. Last inj was 8-19- 2016. Wife not sure what dose it is    . docusate sodium (COLACE) 100 MG capsule Take 100 mg by mouth 2 (two) times daily.    . enzalutamide (XTANDI) 40 MG capsule Take 1 capsule (40 mg total) by mouth daily. 30 capsule 0  . furosemide (LASIX) 40 MG tablet Take 1 tablet (40 mg total) by mouth daily. 90 tablet 3  . Leuprolide Acetate (LUPRON DEPOT IM) Inject 1 each into the muscle every 6 (six) months. Last  inj was in March 2016    . losartan (COZAAR) 100 MG tablet TAKE 1 TABLET BY MOUTH ONCE DAILY 30 tablet 11  . multivitamin (THERAGRAN) per tablet Take 1 tablet by mouth daily. ( with B - Complex )    . Omega-3 Fatty Acids (FISH OIL PO) Take 1 capsule by mouth daily. ( Mega Red )    . tamsulosin (FLOMAX) 0.4 MG CAPS capsule Take 1 capsule (0.4 mg total) by mouth daily. 30 capsule 2  . warfarin (COUMADIN) 5 MG tablet Take as directed by Coumadin clinic 30 tablet 2  . ZETIA 10 MG tablet TAKE 1 TABLET BY MOUTH DAILY 30 tablet  5   No current facility-administered medications for this visit.     Allergies:  Allergies  Allergen Reactions  . Pravachol Other (See Comments)    Muscle weakness  . Zocor [Simvastatin - High Dose] Other (See Comments)    Muscle weakness    Past Medical History, Surgical history, Social history, and Family History were reviewed and updated.   Physical Exam: Blood pressure 164/84, pulse 68, temperature 98 F (36.7 C), temperature source Oral, resp. rate 18, height 6' (1.829 m), SpO2 100 %. ECOG: 2 General appearance: alert, awake elderly gentleman not in any distress at rest.  Head: Normocephalic, without obvious abnormality Neck: no adenopathy Lymph nodes: Cervical, supraclavicular, and axillary nodes normal. Heart:  Irregular rhythm without any murmurs or gallops. Lung:chest clear, no wheezing, rales, no dullness to percussion. Abdomin: soft, non-tender, without masses or organomegaly. No shifting dullness. EXT: Trace edema noted bilaterally on top of the foot. Neurological examination: No deficits noted.  Lab Results: Lab Results  Component Value Date   WBC 7.0 10/22/2014   HGB 13.8 10/22/2014   HCT 42.0 10/22/2014   MCV 97.0 10/22/2014   PLT 152 10/22/2014     Chemistry      Component Value Date/Time   NA 142 09/10/2014 1126   NA 139 08/12/2014 1006   K 4.3 09/10/2014 1126   K 4.0 08/12/2014 1006   CL 105 08/12/2014 1006   CO2 29 09/10/2014 1126   CO2 30 08/12/2014 1006   BUN 35.8* 09/10/2014 1126   BUN 34* 08/12/2014 1006   CREATININE 1.4* 09/10/2014 1126   CREATININE 1.36 08/12/2014 1006      Component Value Date/Time   CALCIUM 9.9 09/10/2014 1126   CALCIUM 9.7 08/12/2014 1006   ALKPHOS 56 09/10/2014 1126   ALKPHOS 72 07/18/2014 0412   AST 15 09/10/2014 1126   AST 29 07/18/2014 0412   ALT 9 09/10/2014 1126   ALT 26 07/18/2014 0412   BILITOT 0.51 09/10/2014 1126   BILITOT 0.6 07/18/2014 0412         Results for RAHUL, MALINAK (MRN  [id number]) as of 10/22/2014 14:27  Ref. Range 04/23/2014 07:00 06/11/2014 13:14 07/24/2014 11:45 09/10/2014 11:26  PSA Latest Ref Range: <=4.00 ng/mL 6.53 (H) 9.27 (H) 5.74 (H) 3.89    EXAM: NUCLEAR MEDICINE WHOLE BODY BONE SCAN  TECHNIQUE: Whole body anterior and posterior images were obtained approximately 3 hours after intravenous injection of radiopharmaceutical.  RADIOPHARMACEUTICALS: 26.6 mCi Technetium-63m MDP IV  COMPARISON: 08/07/2012. MRI 04/22/2014 .  FINDINGS: Left kidney not visualized consistent with previously identified left renal atrophy. Right kidney is normally visualized. Bladder is visualized. Minimal multifocal areas of increased activity noted in the thoracic spine in this patient with known metastases. Minimal increased activity noted in the left anterior upper and lower ribs. These could represent  tiny metastases.  IMPRESSION: 1. Nonvisualization left kidney consistent with known left renal atrophy. 2. Minimal increased activity noted throughout the thoracic spine suggesting slight progression of known metastases. Punctate areas of minimal increase activity noted and left upper and lower anterior ribs suggesting the possibility of metastatic disease.    Impression and Plan:  81 year old gentleman with the following issues:   1. Castration resistant prostate cancer with metastatic disease to the bone. He started on xtandi in September 2014 and have required dose reduction to 40 mg since that time.   He have tolerated dose reduction well with PSA dropping to 3.89. His bone scan is minimally changed and does not indicate a rapid progression. The plan is to continue with the same dose and schedule and use a different salvage therapy upon symptomatic progression. I see no reason to change his regimen at this time.  2. Androgen depravation: He is to continue Lupron at this time. He was recently given at [facility]  3. Bone directed therapy: I  recommend him to continue Xgeva which she has been getting on a monthly basis. This is given at [facility].  4. Hypercalcemia: His calcium level is pending today but elevated on previous testing.  5. Bony metastasis: He have received radiation therapy to the lumbar spine as well as the right femur.  This can be repeated due to other painful spots in the future.  6. Pulmonary embolism: He is currently anticoagulated with Coumadin and bridging Lovenox.   7. Atrial fibrillation: his weight is controlled and followed by Dr. Mare Ferrari.  8. Followup: Will be in 6 weeks.  Zola Button, MD 8/30/20162:42 PM

## 2014-10-22 NOTE — Telephone Encounter (Signed)
appointments made and avs pritned for patient °

## 2014-10-23 ENCOUNTER — Telehealth: Payer: Self-pay | Admitting: *Deleted

## 2014-10-23 LAB — PSA: PSA: 5.58 ng/mL — ABNORMAL HIGH (ref <=4.00)

## 2014-10-23 NOTE — Progress Notes (Signed)
Spoke with wife betty. Gave results of last PSA done, no change in medication. Wife verbalized understanding.

## 2014-10-23 NOTE — Telephone Encounter (Signed)
Lm for wife betty to call me. Re: lab results

## 2014-10-23 NOTE — Telephone Encounter (Signed)
-----   Message from Craig Portela, MD sent at 10/23/2014  8:34 AM EDT ----- Please let his wife now about the PSA. No change in medication for now.

## 2014-11-04 ENCOUNTER — Telehealth: Payer: Self-pay | Admitting: *Deleted

## 2014-11-04 ENCOUNTER — Other Ambulatory Visit: Payer: Self-pay | Admitting: *Deleted

## 2014-11-04 DIAGNOSIS — C7951 Secondary malignant neoplasm of bone: Secondary | ICD-10-CM

## 2014-11-04 DIAGNOSIS — C61 Malignant neoplasm of prostate: Secondary | ICD-10-CM

## 2014-11-04 MED ORDER — ENZALUTAMIDE 40 MG PO CAPS: 40.0000 mg | ORAL_CAPSULE | Freq: Every day | ORAL | Status: DC

## 2014-11-04 NOTE — Telephone Encounter (Signed)
Voicemail requesting refill on Xtandi for [place] near the hospital.

## 2014-11-05 ENCOUNTER — Ambulatory Visit (INDEPENDENT_AMBULATORY_CARE_PROVIDER_SITE_OTHER): Payer: Medicare Other | Admitting: *Deleted

## 2014-11-05 DIAGNOSIS — I482 Chronic atrial fibrillation, unspecified: Secondary | ICD-10-CM

## 2014-11-05 DIAGNOSIS — Z5181 Encounter for therapeutic drug level monitoring: Secondary | ICD-10-CM

## 2014-11-05 DIAGNOSIS — I4891 Unspecified atrial fibrillation: Secondary | ICD-10-CM | POA: Diagnosis not present

## 2014-11-05 LAB — POCT INR: INR: 2

## 2014-11-13 ENCOUNTER — Telehealth: Payer: Self-pay | Admitting: Cardiology

## 2014-11-13 NOTE — Telephone Encounter (Signed)
New Message  Will need a RX mailed to the home to have coumadin checked at Dr. Hazeline Junker office

## 2014-11-14 NOTE — Telephone Encounter (Signed)
Refilled 11-04-2014.

## 2014-12-02 ENCOUNTER — Other Ambulatory Visit: Payer: Self-pay | Admitting: Cardiology

## 2014-12-02 NOTE — Telephone Encounter (Signed)
Pt requesting refill

## 2014-12-03 ENCOUNTER — Other Ambulatory Visit: Payer: Self-pay | Admitting: *Deleted

## 2014-12-03 ENCOUNTER — Ambulatory Visit (HOSPITAL_BASED_OUTPATIENT_CLINIC_OR_DEPARTMENT_OTHER): Payer: Medicare Other | Admitting: Oncology

## 2014-12-03 ENCOUNTER — Telehealth: Payer: Self-pay | Admitting: Oncology

## 2014-12-03 ENCOUNTER — Other Ambulatory Visit (HOSPITAL_BASED_OUTPATIENT_CLINIC_OR_DEPARTMENT_OTHER): Payer: Medicare Other

## 2014-12-03 VITALS — BP 190/82 | HR 76 | Temp 97.6°F | Resp 18 | Ht 73.0 in

## 2014-12-03 DIAGNOSIS — C61 Malignant neoplasm of prostate: Secondary | ICD-10-CM

## 2014-12-03 DIAGNOSIS — I482 Chronic atrial fibrillation, unspecified: Secondary | ICD-10-CM

## 2014-12-03 DIAGNOSIS — Z79899 Other long term (current) drug therapy: Secondary | ICD-10-CM

## 2014-12-03 DIAGNOSIS — I4891 Unspecified atrial fibrillation: Secondary | ICD-10-CM

## 2014-12-03 DIAGNOSIS — Z7901 Long term (current) use of anticoagulants: Secondary | ICD-10-CM

## 2014-12-03 DIAGNOSIS — Z86711 Personal history of pulmonary embolism: Secondary | ICD-10-CM

## 2014-12-03 DIAGNOSIS — Z5181 Encounter for therapeutic drug level monitoring: Secondary | ICD-10-CM

## 2014-12-03 DIAGNOSIS — C7951 Secondary malignant neoplasm of bone: Secondary | ICD-10-CM

## 2014-12-03 DIAGNOSIS — Z923 Personal history of irradiation: Secondary | ICD-10-CM | POA: Diagnosis not present

## 2014-12-03 LAB — CBC WITH DIFFERENTIAL/PLATELET
BASO%: 0.3 % (ref 0.0–2.0)
BASOS ABS: 0 10*3/uL (ref 0.0–0.1)
EOS%: 3.6 % (ref 0.0–7.0)
Eosinophils Absolute: 0.3 10*3/uL (ref 0.0–0.5)
HCT: 42.3 % (ref 38.4–49.9)
HEMOGLOBIN: 14.2 g/dL (ref 13.0–17.1)
LYMPH#: 2.1 10*3/uL (ref 0.9–3.3)
LYMPH%: 26.6 % (ref 14.0–49.0)
MCH: 32.8 pg (ref 27.2–33.4)
MCHC: 33.6 g/dL (ref 32.0–36.0)
MCV: 97.7 fL (ref 79.3–98.0)
MONO#: 0.8 10*3/uL (ref 0.1–0.9)
MONO%: 10 % (ref 0.0–14.0)
NEUT#: 4.8 10*3/uL (ref 1.5–6.5)
NEUT%: 59.5 % (ref 39.0–75.0)
Platelets: 149 10*3/uL (ref 140–400)
RBC: 4.33 10*6/uL (ref 4.20–5.82)
RDW: 14.3 % (ref 11.0–14.6)
WBC: 8 10*3/uL (ref 4.0–10.3)

## 2014-12-03 LAB — COMPREHENSIVE METABOLIC PANEL (CC13)
ALT: 11 U/L (ref 0–55)
AST: 16 U/L (ref 5–34)
Albumin: 3.4 g/dL — ABNORMAL LOW (ref 3.5–5.0)
Alkaline Phosphatase: 63 U/L (ref 40–150)
Anion Gap: 9 mEq/L (ref 3–11)
BUN: 41.8 mg/dL — AB (ref 7.0–26.0)
CHLORIDE: 108 meq/L (ref 98–109)
CO2: 27 mEq/L (ref 22–29)
CREATININE: 1.3 mg/dL (ref 0.7–1.3)
Calcium: 10.2 mg/dL (ref 8.4–10.4)
EGFR: 47 mL/min/{1.73_m2} — ABNORMAL LOW (ref >90)
GLUCOSE: 101 mg/dL (ref 70–140)
POTASSIUM: 4.2 meq/L (ref 3.5–5.1)
SODIUM: 143 meq/L (ref 136–145)
Total Bilirubin: 0.55 mg/dL (ref 0.20–1.20)
Total Protein: 7 g/dL (ref 6.4–8.3)

## 2014-12-03 LAB — PROTIME-INR: INR: 1.9 — AB (ref 0.9–1.1)

## 2014-12-03 MED ORDER — ENZALUTAMIDE 40 MG PO CAPS: 40.0000 mg | ORAL_CAPSULE | Freq: Every day | ORAL | Status: DC

## 2014-12-03 NOTE — Addendum Note (Signed)
Addended by: Randolm Idol on: 12/03/2014 02:05 PM   Modules accepted: Medications

## 2014-12-03 NOTE — Progress Notes (Signed)
Hematology and Oncology Follow Up Visit  Craig [name] [id number] Sep 11, 1924 81 y.o. 12/03/2014 1:21 PM No PCP Per Craig Oman, MD   Principle Diagnosis: 81 year old with Castration resistant prostate cancer with metastatic disease to the bone. He was initially diagnosed in 2011 PSA of 19 and presented with advanced disease.   Prior Therapy: He is status post combined androgen deprivation with Lupron and Casodex with an excellent response initially and had a PSA nadir down to 1.67. Most recently he developed progression of disease and a PSA up to 5.94 in September of 2014. He is status post SRS treatment to the L4 completed on 02/13/2014. He is S/P stereotactic body radiotherapy to the iliac bone treatment completed in 02/2014. He is status post radiation therapy to the right femur completed on 06/07/2014.  Current therapy: He is on Xtandi started on 11/15/2012. He was started on 160 mg initially but the dose was reduced to 80 mg in December of 2014. Treatment has been on hold for 2 months. Xtandi resumed at 40 mg daily starting on 06/12/2014.  He is on Lupron and Xgeva done at [facility]. .  Interim History: Craig [name] presents today for a followup visit with his wife. Since the last visit, he does not report any major changes in his clinical status. He is getting physical therapy and rehabilitation at home and has been getting stronger. He is limited to wheelchair for the most part but able to transfer short distances.   He has reported some occasional weakness in his lower extremities. Does not report any increased pain or pathological fractures. Does not report any new constitutional symptoms.  He continues to be on Xtandi of the current dose without any complications. He denied any recent hospitalization or illnesses.  Has not reported any cough or hemoptysis or hematemesis. Does not report any nausea or vomiting or abdominal pain.Is not reporting any back  pain or neurological symptoms. Does not report any headaches or blurry vision or double vision. Is not reporting any chest pain shortness of breath or difficulty breathing. Does not report any constipation or diarrhea. Does not report any hematochezia or melena. Does not report any skin rashes or lesions. Rest of his review of system is unremarkable.  Medications: I have reviewed the patient's current medications.  Current Outpatient Prescriptions  Medication Sig Dispense Refill  . acetaminophen (TYLENOL) 500 MG tablet Take by mouth. Takes two tablets in the morning and two tablets at night    . amLODipine (NORVASC) 5 MG tablet Take 1 tablet (5 mg total) by mouth daily. 90 tablet 3  . atorvastatin (LIPITOR) 10 MG tablet TAKE 1 TABLET BY MOUTH ONCE DAILY 30 tablet 6  . B Complex-C (B-COMPLEX WITH VITAMIN C) tablet Take 1 tablet by mouth daily.      . benzonatate (TESSALON) 200 MG capsule Take 1 capsule (200 mg total) by mouth 3 (three) times daily as needed for cough. 20 capsule 0  . Denosumab (XGEVA [place]) Inject into the skin every 30 (thirty) days. Monthly. Last inj was 8-19- 2016. Wife not sure what dose it is    . docusate sodium (COLACE) 100 MG capsule Take 100 mg by mouth 2 (two) times daily.    . enzalutamide (XTANDI) 40 MG capsule Take 1 capsule (40 mg total) by mouth daily. 30 capsule 0  . furosemide (LASIX) 40 MG tablet Take 1 tablet (40 mg total) by mouth daily. 90 tablet 3  . Leuprolide Acetate (LUPRON DEPOT  IM) Inject 1 each into the muscle every 6 (six) months. Last inj was in March 2016    . losartan (COZAAR) 100 MG tablet TAKE 1 TABLET BY MOUTH ONCE DAILY 30 tablet 11  . multivitamin (THERAGRAN) per tablet Take 1 tablet by mouth daily. ( with B - Complex )    . Omega-3 Fatty Acids (FISH OIL PO) Take 1 capsule by mouth daily. ( Mega Red )    . tamsulosin (FLOMAX) 0.4 MG CAPS capsule Take 1 capsule (0.4 mg total) by mouth daily. 30 capsule 2  . warfarin (COUMADIN) 5 MG tablet TAKE AS  DIRECTED BY COUMADIN CLINIC 30 tablet 3  . ZETIA 10 MG tablet TAKE 1 TABLET BY MOUTH DAILY 30 tablet 5   No current facility-administered medications for this visit.     Allergies:  Allergies  Allergen Reactions  . Pravachol Other (See Comments)    Muscle weakness  . Zocor [Simvastatin - High Dose] Other (See Comments)    Muscle weakness    Past Medical History, Surgical history, Social history, and Family History were reviewed and updated.   Physical Exam: Blood pressure 190/82, pulse 76, temperature 97.6 F (36.4 C), temperature source Oral, resp. rate 18, height 6\' 1"  (1.854 m), SpO2 100 %. ECOG: 2 General appearance: alert, awake elderly gentleman appeared comfortable. Head: Normocephalic, without obvious abnormality no oral ulcers or lesions. Neck: no adenopathy Lymph nodes: Cervical, supraclavicular, and axillary nodes normal. Heart:  Irregular rhythm without any murmurs or gallops. Lung:chest clear, no wheezing, rales, no dullness to percussion. Abdomin: soft, non-tender, without masses or organomegaly. No shifting dullness. EXT: Trace edema noted bilaterally not dramatically changed. Neurological examination: No deficits noted.  Lab Results: Lab Results  Component Value Date   WBC 8.0 12/03/2014   HGB 14.2 12/03/2014   HCT 42.3 12/03/2014   MCV 97.7 12/03/2014   PLT 149 12/03/2014     Chemistry      Component Value Date/Time   NA 144 10/22/2014 1410   NA 139 08/12/2014 1006   K 4.4 10/22/2014 1410   K 4.0 08/12/2014 1006   CL 105 08/12/2014 1006   CO2 28 10/22/2014 1410   CO2 30 08/12/2014 1006   BUN 40.5* 10/22/2014 1410   BUN 34* 08/12/2014 1006   CREATININE 1.4* 10/22/2014 1410   CREATININE 1.36 08/12/2014 1006      Component Value Date/Time   CALCIUM 9.9 10/22/2014 1410   CALCIUM 9.7 08/12/2014 1006   ALKPHOS 63 10/22/2014 1410   ALKPHOS 72 07/18/2014 0412   AST 17 10/22/2014 1410   AST 29 07/18/2014 0412   ALT 16 10/22/2014 1410   ALT 26  07/18/2014 0412   BILITOT 0.55 10/22/2014 1410   BILITOT 0.6 07/18/2014 0412       Results for Craig [name], Craig [name] (MRN [id number]) as of 12/03/2014 13:24  Ref. Range 04/23/2014 07:00 06/11/2014 13:14 07/24/2014 11:45 09/10/2014 11:26 10/22/2014 14:10  PSA Latest Ref Range: <=4.00 ng/mL 6.53 (H) 9.27 (H) 5.74 (H) 3.89 5.58 (H)      Impression and Plan:  81 year old gentleman with the following issues:   1. Castration resistant prostate cancer with metastatic disease to the bone. He started on xtandi in September 2014 and have required dose reduction to 40 mg since that time.   He have tolerated dose reduction well with PSA have been under reasonable control for the last 2 years. His PSA slightly up in August 2016 to 5.58 after the medication has been withheld  for a period of time. The plan is to continue with the same dose and schedule given his excellent tolerance and benefit by PSA criteria.  2. Androgen depravation: He is to continue Lupron at this time. He was given by Dr. Junious Silk at [facility].  3. Bone directed therapy: I recommend him to continue Xgeva which she has been getting on a monthly basis. This is given at [facility].  4. Hypercalcemia: His calcium level has been consistently within normal range.  5. Bony metastasis: He have received radiation therapy to the lumbar spine as well as the right femur.  No further pain noted at this time.  6. Pulmonary embolism: He is currently anticoagulated with Coumadin and bridging Lovenox.   7. Atrial fibrillation: his weight is controlled and followed by Dr. Mare Ferrari.  8. Followup: Will be in 6 to 7 weeks.  [facility], MD 10/11/20161:21 PM

## 2014-12-03 NOTE — Telephone Encounter (Signed)
Called in Rx for Xtandi to [facility]

## 2014-12-03 NOTE — Telephone Encounter (Signed)
Gave adn printed appt sched and avs for pt for DEC  °

## 2014-12-04 ENCOUNTER — Telehealth: Payer: Self-pay | Admitting: *Deleted

## 2014-12-04 ENCOUNTER — Ambulatory Visit (INDEPENDENT_AMBULATORY_CARE_PROVIDER_SITE_OTHER): Payer: Medicare Other | Admitting: Internal Medicine

## 2014-12-04 DIAGNOSIS — I482 Chronic atrial fibrillation, unspecified: Secondary | ICD-10-CM

## 2014-12-04 DIAGNOSIS — Z5181 Encounter for therapeutic drug level monitoring: Secondary | ICD-10-CM

## 2014-12-04 LAB — PSA: PSA: 5.31 ng/mL — ABNORMAL HIGH (ref <=4.00)

## 2014-12-04 NOTE — Telephone Encounter (Signed)
Spouse [name] called requesting yesterday's PSA results. PSA = 5.31.  "Going in the right direction last months was 5.58."

## 2014-12-04 NOTE — Telephone Encounter (Signed)
-----   Message from Wyatt Portela, MD sent at 12/04/2014 11:56 AM EDT ----- Please call him with his PSA. Down from last month to 5.3 from 5.8.   Thanks,

## 2014-12-04 NOTE — Telephone Encounter (Signed)
As noted below by Dr. Alen Blew, I infomed patient's wife of his PSA level. Wife verbalized understanding.

## 2014-12-30 ENCOUNTER — Ambulatory Visit (INDEPENDENT_AMBULATORY_CARE_PROVIDER_SITE_OTHER): Payer: Medicare Other | Admitting: Cardiology

## 2014-12-30 ENCOUNTER — Ambulatory Visit (INDEPENDENT_AMBULATORY_CARE_PROVIDER_SITE_OTHER): Payer: Medicare Other | Admitting: *Deleted

## 2014-12-30 ENCOUNTER — Encounter: Payer: Self-pay | Admitting: Cardiology

## 2014-12-30 VITALS — BP 140/70 | HR 71 | Ht 73.0 in | Wt 198.0 lb

## 2014-12-30 DIAGNOSIS — I482 Chronic atrial fibrillation, unspecified: Secondary | ICD-10-CM

## 2014-12-30 DIAGNOSIS — I119 Hypertensive heart disease without heart failure: Secondary | ICD-10-CM

## 2014-12-30 DIAGNOSIS — N184 Chronic kidney disease, stage 4 (severe): Secondary | ICD-10-CM | POA: Diagnosis not present

## 2014-12-30 DIAGNOSIS — I4891 Unspecified atrial fibrillation: Secondary | ICD-10-CM

## 2014-12-30 DIAGNOSIS — Z5181 Encounter for therapeutic drug level monitoring: Secondary | ICD-10-CM

## 2014-12-30 LAB — POCT INR: INR: 1.7

## 2014-12-30 NOTE — Progress Notes (Signed)
Cardiology Office Note   Date:  12/31/2014   ID:  Craig Skillman., DOB 02-09-1925, MRN [id number]  PCP:  No PCP Per [name]  Cardiologist: [name] MD  Chief Complaint  [name] presents with  . Hypertension      History of Present Illness: Craig Platten. is a 81 y.o. 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 . In [facility] ER CT angiogram shows chronic pulmonary embolism. He was treated with IV heparin and then transition back to Coumadin and was discharged on Coumadin. Coumadin was felt preferable to Lovenox because of his renal insufficiency. Initially [facility] home health nurse checked his INR is and now he will be coming back to [facility] Coumadin clinic. While in [facility] hospital he was treated with aggressive diuresis and had a flareup of his gout which responded to prednisone therapy Chest x-ray showed possible community-acquired pneumonia and he was treated empirically with oral anti-biotics. Presently he is feeling well. He does not have a productive cough. There is no more hemoptysis. He is on XTandi for his metastatic prostate cancer. He has to interrupted every so often because it makes his legs weak and causes increased edema. He has been having moderately severe pain in Craig right upper thigh.  His wife states that a new lesion has been noted in that area.  He had had previous radiation to that area. Craig [name] is on chronic atrial fibrillation.  He has not been having any angina pectoris.  He has had no TIA or recurrent stroke symptoms.  Past Medical History  Diagnosis Date  . Hyperlipidemia   . Hypokalemia   . Hypertensive heart disease     . Chronic back pain   . History of epistaxis 07/19/2002  . Personal history of long-term (current) use of anticoagulants   . ICH (intracerebral hemorrhage) ([place]) ~ 1999    after TPA/notes 09/27/2012  . Hypertension   . Atrial fibrillation ([place])   . PAT (paroxysmal atrial tachycardia) ([place])   . Pneumonia     "once; several years ago" (09/27/2012)  . GERD (gastroesophageal reflux disease)   . H/O hiatal hernia   . Migraines     "migraines without headaches years ago" (09/27/2012)  . Stroke [facility]) ~ 1999    ischemic / right cerebellar/posterior inferior cerebellar artery / right pos infarct  . Melanoma ([place])     "top of my head" (09/27/2012)  . Prostate cancer, primary, with metastasis from prostate to other site [facility])     on Lupron injections per GU  . Osteoarthritis of both feet   . S/P radiation therapy 02/13/14-02/27/14    SRS 5/5 spine completed 02/27/14  . S/P radiation therapy 05/27/14-05/31/14    right femur 20Gy/15fx    Past Surgical History  Procedure Laterality Date  . Nasal sinus surgery  1998  . Cataract extraction w/ intraocular lens  implant, bilateral Bilateral ~ 2011  . Skin graft Right 2010  . Melanoma excision  2010    "pre-melanoma on top of head; did skin graft from left thigh to cover" (09/27/2012)  . Tonsillectomy  ~ 1935     Current Outpatient Prescriptions  Medication Sig Dispense Refill  . acetaminophen (  TYLENOL) 500 MG tablet Take 1,000 mg by mouth 2 (two) times daily. Takes two tablets in Craig morning and two tablets at night    . amLODipine (NORVASC) 5 MG tablet Take 1 tablet (5 mg total) by mouth daily. 90 tablet 3  . atorvastatin (LIPITOR) 10 MG tablet TAKE 1 TABLET BY MOUTH ONCE DAILY 30 tablet 6  . B Complex-C (B-COMPLEX WITH VITAMIN C) tablet Take 1 tablet by mouth daily.      . benzonatate (TESSALON) 200 MG capsule Take 1 capsule (200 mg total) by mouth 3 (three) times daily as needed for cough. 20 capsule 0  . Denosumab (XGEVA [place]) Inject into Craig skin every  30 (thirty) days. Monthly. Last inj was 8-19- 2016. Wife not sure what dose it is    . docusate sodium (COLACE) 100 MG capsule Take 100 mg by mouth 2 (two) times daily.    . enzalutamide (XTANDI) 40 MG capsule Take 1 capsule (40 mg total) by mouth daily. 30 capsule 1  . furosemide (LASIX) 40 MG tablet Take 1 tablet (40 mg total) by mouth daily. 90 tablet 3  . Leuprolide Acetate (LUPRON DEPOT IM) Inject 1 each into Craig muscle every 6 (six) months. Last inj was in March 2016    . losartan (COZAAR) 100 MG tablet TAKE 1 TABLET BY MOUTH ONCE DAILY 30 tablet 11  . multivitamin (THERAGRAN) per tablet Take 1 tablet by mouth daily. ( with B - Complex )    . Omega-3 Fatty Acids (FISH OIL PO) Take 1 capsule by mouth daily. ( Mega Red )    . tamsulosin (FLOMAX) 0.4 MG CAPS capsule Take 1 capsule (0.4 mg total) by mouth daily. 30 capsule 2  . warfarin (COUMADIN) 5 MG tablet TAKE AS DIRECTED BY COUMADIN CLINIC ([name] taking differently: TAKE AS DIRECTED BY COUMADIN CLINIC  5mg  two times weekly, 2.5 mg 5 times weekly) 30 tablet 3  . ZETIA 10 MG tablet TAKE 1 TABLET BY MOUTH DAILY 30 tablet 5   No current facility-administered medications for this visit.    Allergies:   Pravachol and Zocor    Social History:  Craig [name]  reports that he quit smoking about 61 years ago. His smoking use included Cigarettes. He quit after .5 years of use. He has never used smokeless tobacco. He reports that he drinks alcohol. He reports that he does not use illicit drugs.   Family History:  Craig [name]'s family history includes Heart attack in his father; Heart failure in his mother.    ROS:  Please see Craig history of present illness.   Otherwise, review of systems are positive for none.   All other systems are reviewed and negative.    PHYSICAL EXAM: VS:  BP 140/70 mmHg  Pulse 71  Ht 6\' 1"  (1.854 m)  Wt 198 lb (89.812 kg)  BMI 26.13 kg/m2 , BMI Body mass index is 26.13 kg/(m^2). GEN: Well nourished, well developed,  in no acute distress HEENT: normal Neck: no JVD, carotid bruits, or masses Cardiac: Irregularly irregular; no murmurs, rubs, or gallops, there is bilateral lower extremity edema worse on Craig left Respiratory:  clear to auscultation bilaterally, normal work of breathing GI: soft, nontender, nondistended, + BS MS: no deformity or atrophy Skin: warm and dry, no rash Neuro:  Strength and sensation are intact Psych: euthymic mood, full affect   EKG:  EKG is not ordered today.    Recent Labs: 02/08/2014: Pro B Natriuretic peptide (BNP) 6362.0* 12/03/2014:  ALT 11; BUN 41.8*; Creatinine 1.3; HGB 14.2; Platelets 149; Potassium 4.2; Sodium 143    Lipid Panel    Component Value Date/Time   CHOL 130 04/21/2014 0512   TRIG 97 04/21/2014 0512   HDL 41 04/21/2014 0512   CHOLHDL 3.2 04/21/2014 0512   VLDL 19 04/21/2014 0512   LDLCALC 70 04/21/2014 0512      Wt Readings from Last 3 Encounters:  12/30/14 198 lb (89.812 kg)  08/12/14 195 lb (88.451 kg)  07/14/14 181 lb 9.6 oz (82.373 kg)        ASSESSMENT AND PLAN: 1. Chronic atrial fibrillation.  On long-term Coumadin. 2. Essential hypertension 3. Chronic kidney disease stage III. Has long-term Foley catheter managed by urology 4. Chronic diastolic heart failure 5. Metastatic prostate cancer, on Xtandi. Dr. Osker Mason is his oncologist  6. Recent hospitalization for hemoptysis and questionable acute on chronic pulmonary embolus. Continue warfarin  Plan: Continue current therapy.  Recheck in 2-3 months for follow-up office visit.   Current medicines are reviewed at length with Craig [name] today.  Craig [name] does not have concerns regarding medicines.  Craig following changes have been made:  no change  Labs/ tests ordered today include:  No orders of Craig defined types were placed in this encounter.    Disposition: Continue current medication.  Recheck in 2-3 months with Dr. Mare Ferrari for follow-up office  visit  Signed, [name] MD 12/31/2014 11:54 AM    [address], [place], [place]  [address] Phone: [phone]; Fax: [phone]

## 2014-12-30 NOTE — Patient Instructions (Signed)
Medication Instructions:  Your physician recommends that you continue on your current medications as directed. Please refer to the Current Medication list given to you today.  Labwork: none  Testing/Procedures: none  Follow-Up: Your physician recommends that you schedule a follow-up appointment in: 2-3 month ov with  Dr. Mare Ferrari   If you need a refill on your cardiac medications before your next appointment, please call your pharmacy.

## 2014-12-31 ENCOUNTER — Encounter (HOSPITAL_COMMUNITY): Payer: Self-pay | Admitting: *Deleted

## 2014-12-31 ENCOUNTER — Emergency Department (HOSPITAL_COMMUNITY): Payer: Medicare Other

## 2014-12-31 ENCOUNTER — Observation Stay (HOSPITAL_COMMUNITY)
Admission: EM | Admit: 2014-12-31 | Discharge: 2015-01-01 | Disposition: A | Discharge status: Home / Self Care | Payer: Medicare Other | Source: Intra-hospital | Attending: Infectious Diseases | Admitting: Infectious Diseases

## 2014-12-31 ENCOUNTER — Observation Stay (HOSPITAL_COMMUNITY): Payer: Medicare Other

## 2014-12-31 DIAGNOSIS — I63412 Cerebral infarction due to embolism of left middle cerebral artery: Secondary | ICD-10-CM

## 2014-12-31 DIAGNOSIS — T45515A Adverse effect of anticoagulants, initial encounter: Secondary | ICD-10-CM | POA: Insufficient documentation

## 2014-12-31 DIAGNOSIS — C7951 Secondary malignant neoplasm of bone: Secondary | ICD-10-CM

## 2014-12-31 DIAGNOSIS — I10 Essential (primary) hypertension: Secondary | ICD-10-CM | POA: Diagnosis not present

## 2014-12-31 DIAGNOSIS — R531 Weakness: Secondary | ICD-10-CM | POA: Insufficient documentation

## 2014-12-31 DIAGNOSIS — Z923 Personal history of irradiation: Secondary | ICD-10-CM

## 2014-12-31 DIAGNOSIS — Z7901 Long term (current) use of anticoagulants: Secondary | ICD-10-CM

## 2014-12-31 DIAGNOSIS — R471 Dysarthria and anarthria: Secondary | ICD-10-CM | POA: Diagnosis not present

## 2014-12-31 DIAGNOSIS — I129 Hypertensive chronic kidney disease with stage 1 through stage 4 chronic kidney disease, or unspecified chronic kidney disease: Secondary | ICD-10-CM | POA: Insufficient documentation

## 2014-12-31 DIAGNOSIS — Z8673 Personal history of transient ischemic attack (TIA), and cerebral infarction without residual deficits: Secondary | ICD-10-CM | POA: Insufficient documentation

## 2014-12-31 DIAGNOSIS — I5032 Chronic diastolic (congestive) heart failure: Secondary | ICD-10-CM | POA: Insufficient documentation

## 2014-12-31 DIAGNOSIS — T83518A Infection and inflammatory reaction due to other urinary catheter, initial encounter: Secondary | ICD-10-CM | POA: Diagnosis not present

## 2014-12-31 DIAGNOSIS — C61 Malignant neoplasm of prostate: Secondary | ICD-10-CM | POA: Diagnosis not present

## 2014-12-31 DIAGNOSIS — R4781 Slurred speech: Secondary | ICD-10-CM | POA: Diagnosis not present

## 2014-12-31 DIAGNOSIS — T83511A Infection and inflammatory reaction due to indwelling urethral catheter, initial encounter: Secondary | ICD-10-CM

## 2014-12-31 DIAGNOSIS — B9689 Other specified bacterial agents as the cause of diseases classified elsewhere: Secondary | ICD-10-CM

## 2014-12-31 DIAGNOSIS — R29810 Facial weakness: Secondary | ICD-10-CM | POA: Insufficient documentation

## 2014-12-31 DIAGNOSIS — N390 Urinary tract infection, site not specified: Secondary | ICD-10-CM | POA: Insufficient documentation

## 2014-12-31 DIAGNOSIS — I255 Ischemic cardiomyopathy: Secondary | ICD-10-CM | POA: Insufficient documentation

## 2014-12-31 DIAGNOSIS — R2990 Unspecified symptoms and signs involving the nervous system: Secondary | ICD-10-CM | POA: Diagnosis not present

## 2014-12-31 DIAGNOSIS — Z87891 Personal history of nicotine dependence: Secondary | ICD-10-CM | POA: Insufficient documentation

## 2014-12-31 DIAGNOSIS — I482 Chronic atrial fibrillation, unspecified: Secondary | ICD-10-CM | POA: Diagnosis present

## 2014-12-31 DIAGNOSIS — I639 Cerebral infarction, unspecified: Secondary | ICD-10-CM

## 2014-12-31 DIAGNOSIS — E785 Hyperlipidemia, unspecified: Secondary | ICD-10-CM | POA: Insufficient documentation

## 2014-12-31 DIAGNOSIS — Y846 Urinary catheterization as the cause of abnormal reaction of the patient, or of later complication, without mention of misadventure at the time of the procedure: Secondary | ICD-10-CM | POA: Insufficient documentation

## 2014-12-31 DIAGNOSIS — Z960 Presence of urogenital implants: Secondary | ICD-10-CM | POA: Diagnosis not present

## 2014-12-31 DIAGNOSIS — G459 Transient cerebral ischemic attack, unspecified: Secondary | ICD-10-CM | POA: Diagnosis not present

## 2014-12-31 DIAGNOSIS — N183 Chronic kidney disease, stage 3 unspecified: Secondary | ICD-10-CM | POA: Diagnosis present

## 2014-12-31 DIAGNOSIS — Z79899 Other long term (current) drug therapy: Secondary | ICD-10-CM | POA: Insufficient documentation

## 2014-12-31 DIAGNOSIS — R791 Abnormal coagulation profile: Secondary | ICD-10-CM | POA: Diagnosis not present

## 2014-12-31 DIAGNOSIS — I429 Cardiomyopathy, unspecified: Secondary | ICD-10-CM | POA: Diagnosis not present

## 2014-12-31 DIAGNOSIS — R4701 Aphasia: Secondary | ICD-10-CM | POA: Diagnosis not present

## 2014-12-31 DIAGNOSIS — I481 Persistent atrial fibrillation: Secondary | ICD-10-CM | POA: Diagnosis not present

## 2014-12-31 DIAGNOSIS — G451 Carotid artery syndrome (hemispheric): Secondary | ICD-10-CM | POA: Insufficient documentation

## 2014-12-31 LAB — CBC
HEMATOCRIT: 42.9 % (ref 39.0–52.0)
Hemoglobin: 14.2 g/dL (ref 13.0–17.0)
MCH: 32.3 pg (ref 26.0–34.0)
MCHC: 33.1 g/dL (ref 30.0–36.0)
MCV: 97.7 fL (ref 78.0–100.0)
Platelets: 154 10*3/uL (ref 150–400)
RBC: 4.39 MIL/uL (ref 4.22–5.81)
RDW: 14.8 % (ref 11.5–15.5)
WBC: 9 10*3/uL (ref 4.0–10.5)

## 2014-12-31 LAB — COMPREHENSIVE METABOLIC PANEL
ALK PHOS: 56 U/L (ref 38–126)
ALT: 14 U/L — AB (ref 17–63)
AST: 21 U/L (ref 15–41)
Albumin: 3.5 g/dL (ref 3.5–5.0)
Anion gap: 10 (ref 5–15)
BUN: 37 mg/dL — AB (ref 6–20)
CALCIUM: 10.2 mg/dL (ref 8.9–10.3)
CHLORIDE: 102 mmol/L (ref 101–111)
CO2: 26 mmol/L (ref 22–32)
CREATININE: 1.34 mg/dL — AB (ref 0.61–1.24)
GFR calc Af Amer: 53 mL/min — ABNORMAL LOW (ref >60)
GFR, EST NON AFRICAN AMERICAN: 46 mL/min — AB (ref >60)
Glucose, Bld: 128 mg/dL — ABNORMAL HIGH (ref 65–99)
Potassium: 4.1 mmol/L (ref 3.5–5.1)
SODIUM: 138 mmol/L (ref 135–145)
Total Bilirubin: 0.8 mg/dL (ref 0.3–1.2)
Total Protein: 6.6 g/dL (ref 6.5–8.1)

## 2014-12-31 LAB — RAPID URINE DRUG SCREEN, HOSP PERFORMED
AMPHETAMINES: NOT DETECTED
BARBITURATES: NOT DETECTED
BENZODIAZEPINES: NOT DETECTED
Cocaine: NOT DETECTED
Opiates: NOT DETECTED
Tetrahydrocannabinol: NOT DETECTED

## 2014-12-31 LAB — APTT: APTT: 34 s (ref 24–37)

## 2014-12-31 LAB — I-STAT CHEM 8, ED
BUN: 40 mg/dL — AB (ref 6–20)
CALCIUM ION: 1.21 mmol/L (ref 1.13–1.30)
CHLORIDE: 102 mmol/L (ref 101–111)
CREATININE: 1.4 mg/dL — AB (ref 0.61–1.24)
GLUCOSE: 124 mg/dL — AB (ref 65–99)
HCT: 46 % (ref 39.0–52.0)
Hemoglobin: 15.6 g/dL (ref 13.0–17.0)
POTASSIUM: 4.1 mmol/L (ref 3.5–5.1)
Sodium: 141 mmol/L (ref 135–145)
TCO2: 27 mmol/L (ref 0–100)

## 2014-12-31 LAB — DIFFERENTIAL
BASOS ABS: 0 10*3/uL (ref 0.0–0.1)
BASOS PCT: 0 %
Eosinophils Absolute: 0.2 10*3/uL (ref 0.0–0.7)
Eosinophils Relative: 2 %
LYMPHS PCT: 23 %
Lymphs Abs: 2.1 10*3/uL (ref 0.7–4.0)
MONO ABS: 0.7 10*3/uL (ref 0.1–1.0)
MONOS PCT: 8 %
NEUTROS ABS: 6 10*3/uL (ref 1.7–7.7)
Neutrophils Relative %: 67 %

## 2014-12-31 LAB — GLUCOSE, CAPILLARY
GLUCOSE-CAPILLARY: 106 mg/dL — AB (ref 65–99)
Glucose-Capillary: 109 mg/dL — ABNORMAL HIGH (ref 65–99)

## 2014-12-31 LAB — PROTIME-INR
INR: 1.66 — ABNORMAL HIGH (ref 0.00–1.49)
Prothrombin Time: 19.6 seconds — ABNORMAL HIGH (ref 11.6–15.2)

## 2014-12-31 LAB — URINE MICROSCOPIC-ADD ON

## 2014-12-31 LAB — URINALYSIS, ROUTINE W REFLEX MICROSCOPIC
BILIRUBIN URINE: NEGATIVE
GLUCOSE, UA: NEGATIVE mg/dL
KETONES UR: NEGATIVE mg/dL
Nitrite: POSITIVE — AB
Protein, ur: NEGATIVE mg/dL
Specific Gravity, Urine: 1.015 (ref 1.005–1.030)
UROBILINOGEN UA: 0.2 mg/dL (ref 0.0–1.0)
pH: 5.5 (ref 5.0–8.0)

## 2014-12-31 LAB — I-STAT TROPONIN, ED: Troponin i, poc: 0 ng/mL (ref 0.00–0.08)

## 2014-12-31 LAB — ETHANOL

## 2014-12-31 MED ORDER — FAMOTIDINE 10 MG PO TABS: 10.0000 mg | ORAL_TABLET | Freq: Every day | ORAL | Status: DC

## 2014-12-31 MED ORDER — FAMOTIDINE 200 MG/20ML IV SOLN
10.0000 mg | Freq: Every day | INTRAVENOUS | Status: DC
  Administered 2014-12-31: 10 mg via INTRAVENOUS
  Filled 2014-12-31 (×2): qty 1

## 2014-12-31 MED ORDER — EZETIMIBE 10 MG PO TABS
10.0000 mg | ORAL_TABLET | Freq: Every day | ORAL | Status: DC
  Administered 2015-01-01: 10 mg via ORAL
  Filled 2014-12-31: qty 1

## 2014-12-31 MED ORDER — LABETALOL HCL 5 MG/ML IV SOLN
10.0000 mg | Freq: Once | INTRAVENOUS | Status: AC
  Administered 2014-12-31: 10 mg via INTRAVENOUS
  Filled 2014-12-31: qty 4

## 2014-12-31 MED ORDER — ACETAMINOPHEN 500 MG PO TABS
1000.0000 mg | ORAL_TABLET | Freq: Two times a day (BID) | ORAL | Status: DC
  Administered 2015-01-01: 1000 mg via ORAL
  Filled 2014-12-31: qty 2

## 2014-12-31 MED ORDER — DEXTROSE 5 % IV SOLN
1.0000 g | INTRAVENOUS | Status: DC
  Administered 2014-12-31: 1 g via INTRAVENOUS
  Filled 2014-12-31 (×2): qty 10

## 2014-12-31 MED ORDER — ENOXAPARIN SODIUM 80 MG/0.8ML ~~LOC~~ SOLN
90.0000 mg | Freq: Two times a day (BID) | SUBCUTANEOUS | Status: DC
  Filled 2014-12-31: qty 1.6

## 2014-12-31 MED ORDER — SENNOSIDES-DOCUSATE SODIUM 8.6-50 MG PO TABS: 1.0000 | ORAL_TABLET | Freq: Every evening | ORAL | Status: DC | PRN

## 2014-12-31 MED ORDER — HEPARIN (PORCINE) IN NACL 100-0.45 UNIT/ML-% IJ SOLN
1100.0000 [IU]/h | INTRAMUSCULAR | Status: DC
  Administered 2014-12-31: 850 [IU]/h via INTRAVENOUS
  Filled 2014-12-31: qty 250

## 2014-12-31 MED ORDER — SODIUM CHLORIDE 0.9 % IV SOLN
INTRAVENOUS | Status: AC
  Administered 2014-12-31: 75 mL/h via INTRAVENOUS

## 2014-12-31 MED ORDER — DOCUSATE SODIUM 100 MG PO CAPS
100.0000 mg | ORAL_CAPSULE | Freq: Two times a day (BID) | ORAL | Status: DC
  Administered 2015-01-01: 100 mg via ORAL
  Filled 2014-12-31: qty 1

## 2014-12-31 MED ORDER — ENZALUTAMIDE 40 MG PO CAPS: 40.0000 mg | ORAL_CAPSULE | Freq: Every day | ORAL | Status: DC

## 2014-12-31 MED ORDER — ACETAMINOPHEN 650 MG RE SUPP: 650.0000 mg | RECTAL | Status: DC | PRN

## 2014-12-31 MED ORDER — B COMPLEX-C PO TABS
1.0000 | ORAL_TABLET | Freq: Every day | ORAL | Status: DC
  Administered 2015-01-01: 1 via ORAL
  Filled 2014-12-31: qty 1

## 2014-12-31 MED ORDER — WARFARIN SODIUM 7.5 MG PO TABS: 7.5000 mg | ORAL_TABLET | Freq: Once | ORAL | Status: DC

## 2014-12-31 MED ORDER — WARFARIN - PHARMACIST DOSING INPATIENT: Freq: Every day | Status: DC

## 2014-12-31 MED ORDER — ADULT MULTIVITAMIN W/MINERALS CH
1.0000 | ORAL_TABLET | Freq: Every day | ORAL | Status: DC
  Administered 2015-01-01: 1 via ORAL
  Filled 2014-12-31: qty 1

## 2014-12-31 MED ORDER — ATORVASTATIN CALCIUM 10 MG PO TABS
10.0000 mg | ORAL_TABLET | Freq: Every day | ORAL | Status: DC
  Administered 2015-01-01: 10 mg via ORAL
  Filled 2014-12-31: qty 1

## 2014-12-31 NOTE — ED Notes (Signed)
Pt arrives from home via [facility]. Pt wife states at 66 this morning pt had a sudden onset of slurred speech and expressive aphasia along with right sided weakness and a right sided facial droop. Pt has had a previous CVA with little deficits per his wife. Pt wife states he has been able to return to playing golf and only has "a little speech impediment and some balance issues".

## 2014-12-31 NOTE — ED Notes (Signed)
Case Manager/Interpreter: [phone]. Nurse to call once we figure out something about pt.   

## 2014-12-31 NOTE — Code Documentation (Signed)
81yo male arriving to [facility] via [facility] at 1341. Patient with sudden onset difficulty with speech at 1030.  Patient has a h/o stroke with residual speech difficulty and balance issues per patient's wife.  Today patient was at his baseline at 1030 when his wife noticed he was unable to get his words out.  EMS called and assessed patient to have garbled speech and confusion and activated a code stroke.  Stroke team at the bedside on patient arrival.  Labs drawn and patient to CT with team.  NIHSS 7, see documentation for details and code stroke times.  Patient with continued expressive aphasia, mild right facial droop and RUQ visual field deficit.  Patient with h/o atrial fibrillation on Coumadin with INR 1.7 yesterday in the office per patient's wife.  Patient was instructed to take additional dose of Coumadin last night d/t subtherapeutic INR.  INR 1.66 on arrival.  Of note, patient received tPA for a prior stroke and sustained post-tPA ICH.  Dr. Erlinda Hong at the bedside for assessment and discussing treatment options and plan of care with patient's wife.  Decision made not to give tPA per Dr. Erlinda Hong d/t increased risk of bleeding with multiple relative contraindications.  No acute stroke treatment at this time.  Bedside handoff with ED RN Elmyra Ricks.

## 2014-12-31 NOTE — H&P (Signed)
Date: 12/31/2014               Patient Name:  Craig [name]. MRN: [id number]  DOB: [birth year] Age / Sex: 81 y.o., male   PCP: No Pcp Per Patient         Medical Service: Internal Medicine Teaching Service         Attending Physician: Dr. Campbell Riches, MD    First Contact: Dr. Benjamine Mola Pager: [phone]  Second Contact: Dr. 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 He was slightly subtherapeutic at INR 1.7 at that time. His wife observed him weaker than normal during his morning routine and progressive dysarthria starting around 10:30 AM. By 11 AM he had phone conversation with his urologist during which speech was obviously garbled.  He was transported via EMS and on admission was hypertensive to 200/100 with ongoing atrial fibrillation. CT head demonstrated no acute ICH. Initial lab studies unremarkable except for urinalysis showing many bacteria plus leukocytes and nitrates consistent with his chronically indwelling Foley catheter since February of this year. According to his wife and pastor who was present his facial droop and speech have improved partially since their onset this morning.  Meds: Current Facility-Administered Medications  Medication Dose Route Frequency Provider Last Rate Last Dose  . 0.9 %  sodium  chloride infusion   Intravenous Continuous Jones Bales, MD      . acetaminophen (TYLENOL) tablet 1,000 mg  1,000 mg Oral BID Jones Bales, MD      . Derrill Memo ON 01/01/2015] atorvastatin (LIPITOR) tablet 10 mg  10 mg Oral Daily Jones Bales, MD      . Derrill Memo ON 01/01/2015] B-complex with vitamin C tablet 1 tablet  1 tablet Oral Daily Jones Bales, MD      . cefTRIAXone (ROCEPHIN) 1 g in dextrose 5 % 50 mL IVPB  1 g Intravenous Q24H Campbell Riches, MD      . docusate sodium (COLACE) capsule 100 mg  100 mg Oral BID Jones Bales, MD      . enzalutamide [name]) capsule 40 mg  40 mg Oral Daily Jones Bales, MD   40 mg at 12/31/14 1823  . [START ON 01/01/2015] ezetimibe (ZETIA) tablet 10 mg  10 mg Oral Daily Jones Bales, MD      . famotidine (PEPCID) tablet 10 mg  10 mg Oral QHS Jones Bales, MD      . multivitamin with minerals tablet 1 tablet  1 tablet Oral Daily Jones Bales, MD      . senna-docusate (Senokot-S) tablet 1 tablet  1 tablet Oral QHS PRN Jones Bales, MD      . warfarin (COUMADIN) tablet 7.5 mg  7.5 mg Oral ONCE-1800 Campbell Riches, MD      . Warfarin - Pharmacist Dosing Inpatient  Does not apply q1800 Campbell Riches, MD        Allergies: Allergies as of 12/31/2014 - Review Complete 12/31/2014  Allergen Reaction Noted  . Pravachol Other (See Comments) 07/24/2010  . Zocor [simvastatin - high dose] Other (See Comments) 07/24/2010   Past Medical History  Diagnosis Date  . Hyperlipidemia   . Hypokalemia   . Hypertensive heart disease   . Chronic back pain   . History of epistaxis 07/19/2002  . Personal history of long-term (current) use of anticoagulants   . ICH (intracerebral hemorrhage) ([place]) ~ 1999    after TPA/notes 09/27/2012  . Hypertension   . Atrial fibrillation ([place])   . PAT (paroxysmal atrial tachycardia) ([place])   . Pneumonia     "once; several years ago" (09/27/2012)  . GERD (gastroesophageal reflux disease)   . H/O  hiatal hernia   . Migraines     "migraines without headaches years ago" (09/27/2012)  . Stroke [facility]) ~ 1999    ischemic / right cerebellar/posterior inferior cerebellar artery / right pos infarct  . Melanoma ([place])     "top of my head" (09/27/2012)  . Prostate cancer, primary, with metastasis from prostate to other site [facility])     on Lupron injections per GU  . Osteoarthritis of both feet   . S/P radiation therapy 02/13/14-02/27/14    SRS 5/5 spine completed 02/27/14  . S/P radiation therapy 05/27/14-05/31/14    right femur 20Gy/2fx   Past Surgical History  Procedure Laterality Date  . Nasal sinus surgery  1998  . Cataract extraction w/ intraocular lens  implant, bilateral Bilateral ~ 2011  . Skin graft Right 2010  . Melanoma excision  2010    "pre-melanoma on top of head; did skin graft from left thigh to cover" (09/27/2012)  . Tonsillectomy  ~ 1935   Family History  Problem Relation Age of Onset  . Heart failure Mother   . Heart attack Father    Social History   Social History  . Marital Status: Married    Spouse Name: [name]  . Number of Children: 3  . Years of Education: MD   Occupational History  .     Social History Main Topics  . Smoking status: Former Smoker -- .5 years    Types: Cigarettes    Quit date: 02/22/1953  . Smokeless tobacco: Never Used  . Alcohol Use: No     Comment: 09/27/2012 "glass of wine or 2/month"  . Drug Use: No  . Sexual Activity: No   Other Topics Concern  . Not on file   Social History Narrative   Patient lives at home with spouse.   Caffeine Use: 1/4 cup daily    Review of Systems: Review of Systems  Constitutional: Negative for fever.  Eyes: Negative for blurred vision.  Respiratory: Negative for cough.   Cardiovascular: Negative for chest pain and palpitations.  Gastrointestinal: Negative for nausea and abdominal pain.  Genitourinary: Negative for dysuria.  Skin: Negative for rash.  Neurological: Negative for dizziness, focal weakness  and headaches.  Endo/Heme/Allergies: Bruises/bleeds easily.    Physical Exam: Blood pressure 190/88, pulse 78, temperature 98.5 F (36.9 C), temperature source Oral, resp. rate 18, weight 95.8 kg (211 lb 3.2 oz), SpO2 97 %.   GENERAL- alert, co-operative, NAD HEENT- Atraumatic, PERRL, EOMI, slight droop to R mouth, arcus senilis b/l, oral mucosa appears moist, fair dentition, no cervical LN enlargement. CARDIAC- Regular rate, irregular rhythm, no murmurs, rubs or gallops. RESP- CTAB,  no wheezes or crackles. ABDOMEN- Soft, nontender, no guarding or rebound NEURO- No obvious Cr N abnormality, strength upper and lower extremities- 5/5, Sensation intact- globally, speech dysarthric but mostly intelligible EXTREMITIES- pulse 2+, symmetric, no pedal edema. SKIN- Warm, dry, No rash or lesion. PSYCH- Normal mood and affect, appropriate thought content and speech.   Lab results: Basic Metabolic Panel:  Recent Labs  12/31/14 1343 12/31/14 1354  NA 138 141  K 4.1 4.1  CL 102 102  CO2 26  --   GLUCOSE 128* 124*  BUN 37* 40*  CREATININE 1.34* 1.40*  CALCIUM 10.2  --    Liver Function Tests:  Recent Labs  12/31/14 1343  AST 21  ALT 14*  ALKPHOS 56  BILITOT 0.8  PROT 6.6  ALBUMIN 3.5   No results for input(s): LIPASE, AMYLASE in the last 72 hours. No results for input(s): AMMONIA in the last 72 hours. CBC:  Recent Labs  12/31/14 1343 12/31/14 1354  WBC 9.0  --   NEUTROABS 6.0  --   HGB 14.2 15.6  HCT 42.9 46.0  MCV 97.7  --   PLT 154  --    Cardiac Enzymes: No results for input(s): CKTOTAL, CKMB, CKMBINDEX, TROPONINI in the last 72 hours. BNP: No results for input(s): PROBNP in the last 72 hours. D-Dimer: No results for input(s): DDIMER in the last 72 hours. CBG: No results for input(s): GLUCAP in the last 72 hours. Hemoglobin A1C: No results for input(s): HGBA1C in the last 72 hours. Fasting Lipid Panel: No results for input(s): CHOL, HDL, LDLCALC, TRIG,  CHOLHDL, LDLDIRECT in the last 72 hours. Thyroid Function Tests: No results for input(s): TSH, T4TOTAL, FREET4, T3FREE, THYROIDAB in the last 72 hours. Anemia Panel: No results for input(s): VITAMINB12, FOLATE, FERRITIN, TIBC, IRON, RETICCTPCT in the last 72 hours. Coagulation:  Recent Labs  12/30/14 1328 12/31/14 1343  LABPROT  --  19.6*  INR 1.7 1.66*   Urine Drug Screen: Drugs of Abuse     Component Value Date/Time   LABOPIA NONE DETECTED 12/31/2014 1500   COCAINSCRNUR NONE DETECTED 12/31/2014 1500   LABBENZ NONE DETECTED 12/31/2014 1500   AMPHETMU NONE DETECTED 12/31/2014 1500   THCU NONE DETECTED 12/31/2014 1500   LABBARB NONE DETECTED 12/31/2014 1500    Alcohol Level:  Recent Labs  12/31/14 1343  ETH <5   Urinalysis:  Recent Labs  12/31/14 1500  COLORURINE YELLOW  LABSPEC 1.015  PHURINE 5.5  GLUCOSEU NEGATIVE  HGBUR MODERATE*  BILIRUBINUR NEGATIVE  KETONESUR NEGATIVE  PROTEINUR NEGATIVE  UROBILINOGEN 0.2  NITRITE POSITIVE*  LEUKOCYTESUR LARGE*    Imaging results:  Ct Head Wo Contrast  12/31/2014  CLINICAL DATA:  Code stroke. Right-sided weakness. Facial droop and slurred speech. EXAM: CT HEAD WITHOUT CONTRAST TECHNIQUE: Contiguous axial images were obtained from the base of the skull through the vertex without intravenous contrast. COMPARISON:  MRI 04/19/2014, CT head 04/19/2014 FINDINGS: Negative for acute hemorrhage.  No acute infarct Chronic infarct throughout most of the right cerebellum. Chronic infarct right pons. Mild chronic ischemia left cerebellum. Chronic ischemic in the cerebral white matter right greater than left. Ventricle size normal. Cerebral volume normal for age. Negative for mass lesion. No shift of the midline structures Calvarium intact. Atherosclerotic calcification in the vertebral arteries bilaterally right greater than left. Atherosclerotic calcification in the carotid arteries bilaterally. IMPRESSION: Chronic ischemic changes are  stable from the prior study. No acute abnormality. Critical Value/emergent results were called by telephone at the time  of interpretation on 12/31/2014 at 2:08 pm to Dr. Armida Sans , who verbally acknowledged these results. Electronically Signed   By: Franchot Gallo M.D.   On: 12/31/2014 14:09    Other results: EKG: atrial fibrillation, rate 98 bpm  Assessment & Plan by Problem: Recurrent cerebrovascular ischemic episode Presenting symptoms consistent with stroke and patient having underlying risk factors of review of stroke and TIA, chronic A. Fib, and metastatic prostate cancer. CT head did not show acute changes. EPA not given due to timing window and patient has previous history of hemorrhagic conversion status post TPA in 2002. She was value by neurology and will now undergo repeat stroke workup. -F/U MRI -TTE -Carotid doppler US -atorvastatin 10mg , zetia 10mg  -NPO until SLP consult -Lipid panel, Hgb A1c -Neurology recs appreciated  Chronic atrial fibrillation -Continue warfarin with INR goal 1.8-2.2, dosing per pharmacy consult. Takes 5-7.5mg  daily at home. -pepcid 10mg  qhs  Prostate cancer with bone metastases Mets to R sacrum and femur. Treated with radiation, on Xtandi, leupron at outpt urology. Does not have signs of osteolytic activity on last bone scan, normal Ca. -Continue Xtandi 40mg  PO  CKD stage 3 Cr 1.3-1.4 is at his baseline. No worsening renal function suggesting active UTI or bacteremia.  Hypertension Hypertensive up to max of 217/140 in ED. Should be treated for permissive hypertension in setting of suspected acute cerebral ischemia. Holding most home antihypertensives, will advance slowly as needed for ongoing severe hypertension >180 SBP.  Chronic indwelling foley catheter Exchanged monthly, with outpt Urology F/U with Dr. Junious Silk at [facility]. Related to his prostate cancer status post radiation and chemotherapy. Most likely has chronic bacterial  colonization without symptomatic UTI. Hypertensive, afebrile, no dysuria. Can consider active infection symptoms in setting of new neurologic deficits. Does have reportedly scant bleeding at urethral meatus, Foley catheter last changed on 10/28. -ceftriaxone 1 g IV given  Diet: NPO until SLP evaluation DVT ppx: On coumadin anticoagulation FULL CODE  Dispo: Disposition is deferred at this time, awaiting improvement of current medical problems. Anticipated discharge in approximately 2 day(s).   The patient does not know have a current PCP (No Pcp Per Patient) and does not need an [facility] hospital follow-up appointment after discharge.  The patient does have transportation limitations that hinder transportation to clinic appointments.  Signed: Collier Salina, MD 12/31/2014, 6:27 PM

## 2014-12-31 NOTE — Consult Note (Addendum)
PHARMACY NOTE  Consult :  Heparin  Indication :  Atrial fibrillation with new CVA  Lovenox Dosing Wt :  95 kg  LABS :  Recent Labs  12/30/14 1328 12/31/14 1343 12/31/14 1354  HGB  --  14.2 15.6  HCT  --  42.9 46.0  PLT  --  154  --   APTT  --  34  --   LABPROT  --  19.6*  --   INR 1.7 1.66*  --   CREATININE  --  1.34* 1.40*    MEDICATION: Medication PTA: Prescriptions prior to admission  Medication Sig Dispense Refill Last Dose  . acetaminophen (TYLENOL) 500 MG tablet Take 1,000 mg by mouth 2 (two) times daily. Takes two tablets in the morning and two tablets at night   12/31/2014 at Unknown time  . amLODipine (NORVASC) 5 MG tablet Take 1 tablet (5 mg total) by mouth daily. 90 tablet 3 12/31/2014 at Unknown time  . atorvastatin (LIPITOR) 10 MG tablet TAKE 1 TABLET BY MOUTH ONCE DAILY 30 tablet 6 12/31/2014 at Unknown time  . B Complex-C (B-COMPLEX WITH VITAMIN C) tablet Take 1 tablet by mouth daily.     12/31/2014 at Unknown time  . Denosumab (XGEVA [place]) Inject into the skin every 30 (thirty) days. Monthly. Last inj was 8-19- 2016. Wife not sure what dose it is   12/20/2014  . docusate sodium (COLACE) 100 MG capsule Take 100 mg by mouth 2 (two) times daily.   12/31/2014 at Unknown time  . enzalutamide (XTANDI) 40 MG capsule Take 1 capsule (40 mg total) by mouth daily. 30 capsule 1 12/31/2014 at Unknown time  . furosemide (LASIX) 40 MG tablet Take 1 tablet (40 mg total) by mouth daily. 90 tablet 3 12/31/2014 at Unknown time  . Leuprolide Acetate (LUPRON DEPOT IM) Inject 1 each into the muscle every 6 (six) months. Last inj was in March 2016   September 2016  . losartan (COZAAR) 100 MG tablet TAKE 1 TABLET BY MOUTH ONCE DAILY 30 tablet 11 12/31/2014 at Unknown time  . multivitamin (THERAGRAN) per tablet Take 1 tablet by mouth daily. ( with B - Complex )   12/31/2014 at Unknown time  . Omega-3 Fatty Acids (FISH OIL PO) Take 1 capsule by mouth daily. ( Mega Red  )   12/31/2014 at Unknown time  . ranitidine (ZANTAC) 150 MG tablet Take 150 mg by mouth at bedtime.   12/30/2014 at Unknown time  . tamsulosin (FLOMAX) 0.4 MG CAPS capsule Take 1 capsule (0.4 mg total) by mouth daily. 30 capsule 2 12/31/2014 at Unknown time  . warfarin (COUMADIN) 5 MG tablet TAKE AS DIRECTED BY COUMADIN CLINIC (Patient taking differently: TAKE AS DIRECTED BY COUMADIN CLINIC  5mg  Monday and Thursday and 2.5mg  all other days) 30 tablet 3 12/30/2014 at Unknown time  . ZETIA 10 MG tablet TAKE 1 TABLET BY MOUTH DAILY 30 tablet 5 12/31/2014 at Unknown time   Scheduled:  Scheduled:  . acetaminophen  1,000 mg Oral BID  . [START ON 01/01/2015] atorvastatin  10 mg Oral Daily  . [START ON 01/01/2015] B-complex with vitamin C  1 tablet Oral Daily  . cefTRIAXone (ROCEPHIN)  IV  1 g Intravenous Q24H  . docusate sodium  100 mg Oral BID  . enzalutamide  40 mg Oral Daily  . [START ON 01/01/2015] ezetimibe  10 mg Oral Daily  . famotidine (PEPCID) IV  10 mg Intravenous QHS  . famotidine  10 mg Oral  QHS  . multivitamin with minerals  1 tablet Oral Daily  . warfarin  7.5 mg Oral ONCE-1800  . Warfarin - Pharmacist Dosing Inpatient   Does not apply q1800   ASSESSMENT :  81 y.o. male is currently on Coumadin for atrial fibrillation and recurrent cerebrovascular ischemic episode.   Patient is currently NPO until SLP evaluation.  Heparin bridging -- low goal range  Today's INR 1.66.   INR is sub-therapeutic.   No evidence of bleeding complications observed.  GOAL :  TARGET INR 2-3   Heparin level = 0.3-0.5  PLAN : Heparin drip at 850 units / hr Heparin level and CBC at 7 am then daily labs  Thank you Anette Guarneri, PharmD [phone]     12/31/2014,  9:02 PM

## 2014-12-31 NOTE — ED Provider Notes (Addendum)
CSN: [id number]     Arrival date & time 12/31/14  1341 History   None    Chief Complaint  Patient presents with  . Code Stroke     (Consider location/radiation/quality/duration/timing/severity/associated sxs/prior Treatment) HPI Comments: Pt with hx of a-fib, on coumadin presents as code stroke.  He has had a prior stroke with residual deficits of mild right side facial dropping and mild balance problems.  Today at 10:30, had onset of speech problems with aphasia and vision problems.  His wife states that he kept covering one eye to see better.  She also noted increase in right side facial drooping.  With his prior stroke, he received TPA and had a subsequent bleed.  He did make an almost full recovery and was back to playing golf.     Past Medical History  Diagnosis Date  . Hyperlipidemia   . Hypokalemia   . Hypertensive heart disease   . Chronic back pain   . History of epistaxis 07/19/2002  . Personal history of long-term (current) use of anticoagulants   . ICH (intracerebral hemorrhage) ([place]) ~ 1999    after TPA/notes 09/27/2012  . Hypertension   . Atrial fibrillation ([place])   . PAT (paroxysmal atrial tachycardia) ([place])   . Pneumonia     "once; several years ago" (09/27/2012)  . GERD (gastroesophageal reflux disease)   . H/O hiatal hernia   . Migraines     "migraines without headaches years ago" (09/27/2012)  . Stroke [facility]) ~ 1999    ischemic / right cerebellar/posterior inferior cerebellar artery / right pos infarct  . Melanoma ([place])     "top of my head" (09/27/2012)  . Prostate cancer, primary, with metastasis from prostate to other site [facility])     on Lupron injections per GU  . Osteoarthritis of both feet   . S/P radiation therapy 02/13/14-02/27/14    SRS 5/5 spine completed 02/27/14  . S/P radiation therapy 05/27/14-05/31/14    right femur 20Gy/23fx   Past Surgical History  Procedure Laterality Date  . Nasal sinus surgery  1998  . Cataract extraction w/ intraocular lens  implant,  bilateral Bilateral ~ 2011  . Skin graft Right 2010  . Melanoma excision  2010    "pre-melanoma on top of head; did skin graft from left thigh to cover" (09/27/2012)  . Tonsillectomy  ~ 1935   Family History  Problem Relation Age of Onset  . Heart failure Mother   . Heart attack Father    Social History  Substance Use Topics  . Smoking status: Former Smoker -- .5 years    Types: Cigarettes    Quit date: 02/22/1953  . Smokeless tobacco: Never Used  . Alcohol Use: Yes     Comment: 09/27/2012 "glass of wine or 2/month"    Review of Systems  Unable to perform ROS: Mental status change      Allergies  Pravachol and Zocor  Home Medications   Prior to Admission medications   Medication Sig Start Date End Date Taking? Authorizing Provider  acetaminophen (TYLENOL) 500 MG tablet Take 1,000 mg by mouth 2 (two) times daily. Takes two tablets in the morning and two tablets at night   Yes Historical Provider, MD  amLODipine (NORVASC) 5 MG tablet Take 1 tablet (5 mg total) by mouth daily. 08/12/14 08/12/15 Yes [name], MD  atorvastatin (LIPITOR) 10 MG tablet TAKE 1 TABLET BY MOUTH ONCE DAILY 10/21/14  Yes [name], MD  B Complex-C (B-COMPLEX WITH VITAMIN C)  tablet Take 1 tablet by mouth daily.     Yes Historical Provider, MD  Denosumab (XGEVA [place]) Inject into the skin every 30 (thirty) days. Monthly. Last inj was 8-19- 2016. Wife not sure what dose it is   Yes Historical Provider, MD  docusate sodium (COLACE) 100 MG capsule Take 100 mg by mouth 2 (two) times daily. 09/10/14  Yes Historical Provider, MD  enzalutamide [name]) 40 MG capsule Take 1 capsule (40 mg total) by mouth daily. 12/03/14  Yes Wyatt Portela, MD  furosemide (LASIX) 40 MG tablet Take 1 tablet (40 mg total) by mouth daily. 03/01/14  Yes [name], MD  Leuprolide Acetate (LUPRON DEPOT IM) Inject 1 each into the muscle every 6 (six) months. Last inj was in March 2016   Yes Historical Provider, MD  losartan  (COZAAR) 100 MG tablet TAKE 1 TABLET BY MOUTH ONCE DAILY 08/12/14  Yes [name], MD  multivitamin [facility]) per tablet Take 1 tablet by mouth daily. ( with B - Complex )   Yes Historical Provider, MD  Omega-3 Fatty Acids (FISH OIL PO) Take 1 capsule by mouth daily. ( Mega Red )   Yes Historical Provider, MD  ranitidine (ZANTAC) 150 MG tablet Take 150 mg by mouth at bedtime.   Yes Historical Provider, MD  tamsulosin (FLOMAX) 0.4 MG CAPS capsule Take 1 capsule (0.4 mg total) by mouth daily. 04/23/14  Yes Oswald Hillock, MD  warfarin (COUMADIN) 5 MG tablet TAKE AS DIRECTED BY COUMADIN CLINIC Patient taking differently: TAKE AS DIRECTED BY COUMADIN CLINIC  5mg  Monday and Thursday and 2.5mg  all other days 12/02/14  Yes [name], MD  ZETIA 10 MG tablet TAKE 1 TABLET BY MOUTH DAILY 08/19/14  Yes [name], MD   BP 217/102 mmHg  Pulse 98  Temp(Src) 98.2 F (36.8 C) (Oral)  Resp 13  Wt 211 lb 3.2 oz (95.8 kg)  SpO2 96% Physical Exam  Constitutional: He appears well-developed and well-nourished.  HENT:  Head: Normocephalic and atraumatic.  Eyes: Pupils are equal, round, and reactive to light.  Neck: Normal range of motion. Neck supple.  Cardiovascular: Normal rate, regular rhythm and normal heart sounds.   Pulmonary/Chest: Effort normal and breath sounds normal. No respiratory distress. He has no wheezes. He has no rales. He exhibits no tenderness.  Abdominal: Soft. Bowel sounds are normal. There is no tenderness. There is no rebound and no guarding.  Musculoskeletal: Normal range of motion. He exhibits no edema.  Lymphadenopathy:    He has no cervical adenopathy.  Neurological: He is alert.  Has difficulty following commands, slight right side facial droop, ?vision loss, symmetric strength and sensation in extremities.  +expressive aphasia.  Skin: Skin is warm and dry. No rash noted.  Psychiatric: He has a normal mood and affect.    ED Course  Procedures (including  critical care time) Labs Review Labs Reviewed  PROTIME-INR - Abnormal; Notable for the following:    Prothrombin Time 19.6 (*)    INR 1.66 (*)    All other components within normal limits  COMPREHENSIVE METABOLIC PANEL - Abnormal; Notable for the following:    Glucose, Bld 128 (*)    BUN 37 (*)    Creatinine, Ser 1.34 (*)    ALT 14 (*)    GFR calc non Af Amer 46 (*)    GFR calc Af Amer 53 (*)    All other components within normal limits  I-STAT CHEM 8, ED - Abnormal; Notable for  the following:    BUN 40 (*)    Creatinine, Ser 1.40 (*)    Glucose, Bld 124 (*)    All other components within normal limits  ETHANOL  APTT  CBC  DIFFERENTIAL  URINE RAPID DRUG SCREEN, HOSP PERFORMED  URINALYSIS, ROUTINE W REFLEX MICROSCOPIC (NOT AT [facility])  I-STAT TROPOININ, ED    Imaging Review Ct Head Wo Contrast  12/31/2014  CLINICAL DATA:  Code stroke. Right-sided weakness. Facial droop and slurred speech. EXAM: CT HEAD WITHOUT CONTRAST TECHNIQUE: Contiguous axial images were obtained from the base of the skull through the vertex without intravenous contrast. COMPARISON:  MRI 04/19/2014, CT head 04/19/2014 FINDINGS: Negative for acute hemorrhage.  No acute infarct Chronic infarct throughout most of the right cerebellum. Chronic infarct right pons. Mild chronic ischemia left cerebellum. Chronic ischemic in the cerebral white matter right greater than left. Ventricle size normal. Cerebral volume normal for age. Negative for mass lesion. No shift of the midline structures Calvarium intact. Atherosclerotic calcification in the vertebral arteries bilaterally right greater than left. Atherosclerotic calcification in the carotid arteries bilaterally. IMPRESSION: Chronic ischemic changes are stable from the prior study. No acute abnormality. Critical Value/emergent results were called by telephone at the time of interpretation on 12/31/2014 at 2:08 pm to Dr. Armida Sans , who verbally acknowledged these results.  Electronically Signed   By: Franchot Gallo M.D.   On: 12/31/2014 14:09   I have personally reviewed and evaluated these images and lab results as part of my medical decision-making.   EKG Interpretation   Date/Time:  Tuesday December 31 2014 14:04:21 EST Ventricular Rate:  98 PR Interval:    QRS Duration: 97 QT Interval:  367 QTC Calculation: 469 R Axis:   -40 Text Interpretation:  Atrial fibrillation Paired ventricular premature  complexes Left ventricular hypertrophy Anterior Q waves, possibly due to  LVH since last tracing no significant change Confirmed by Karyn Brull  MD,  Analucia Hush ([id number]) on 12/31/2014 3:22:00 PM      MDM   Final diagnoses:  Cerebral infarction due to unspecified mechanism    PT seen by Dr Erlinda Hong with neurology, not felt to be a tpa candidate.  Given small dose of labetelol for BP after discussion with Dr. Aram Beecham.  Spoke with the IM teaching service who will admit pt.  PT is under the care of Dr. Mare Ferrari with cardiology, does not have another PCP.  I did have the office of the [place] [place] to send a message to Dr. Mare Ferrari that pt is being admitted.    Malvin Johns, MD 12/31/14 [place], MD 12/31/14 [phone]

## 2014-12-31 NOTE — Consult Note (Addendum)
Referring Physician: ED physician    Chief Complaint: right facial droop and garbled speech  HPI: Craig [name]. 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 He was in his normal health until about 10:30 this morning his wife found him to have mild right facial droop and difficulty with sentences. Patient seems to answer short questions okay but difficulty with longer sentences and difficulty with commands. EMS was called, on arrival blood pressure 200/100, glucose 122, telemetry showed A. fib rhythm. Code stroke initiated. On arrival, patient continued to have language deficits but right facial droop gradually getting better. INR 1.66, and CT head did not show ICH.  As per wife, he was just saw his cardiologist Dr. Mare [name] yesterday, had INR check 1.7, and his Coumadin dose was increased, and he took 7.5 mg Coumadin last night. He had a history no bleeding on Coumadin with required blood transfusion, therefore his INR goal was 1.8-2.2 by Dr. Tonia [name]. Also, he had right cerebellar stroke in 2002, was given TPA, then developed hemorrhagic transformation which did not require any surgical intervention. Patient also had prostate cancer metastasis to right hip, pelvis, and spine status post radiation. Patient had indwelling urine catheter.  LSN: 10:30 AM tPA Given: No: Decision making was very complex, however decision was made to not give TPA due to time window between 3-4.5 hours and age 75, INR 1.66 with increased dose last night, nose bleeding requiring blood transfusion on Coumadin, currently INR goal 1.8 - 2.2, history of TPA related hemorrhagic transformation. Discussed with wife at bedside and she agreed.  Past Medical History   Diagnosis Date  . Hyperlipidemia   . Hypokalemia   . Hypertensive heart disease   . Chronic back pain   . History of epistaxis 07/19/2002  . Personal history of long-term (current) use of anticoagulants   . ICH (intracerebral hemorrhage) ([place]) ~ 1999    after TPA/notes 09/27/2012  . Hypertension   . Atrial fibrillation ([place])   . PAT (paroxysmal atrial tachycardia) ([place])   . Pneumonia     "once; several years ago" (09/27/2012)  . GERD (gastroesophageal reflux disease)   . H/O hiatal hernia   . Migraines     "migraines without headaches years ago" (09/27/2012)  . Stroke [facility]) ~ 1999    ischemic / right cerebellar/posterior inferior cerebellar artery / right pos infarct  . Melanoma ([place])     "top of my head" (09/27/2012)  . Prostate cancer, primary, with metastasis from prostate to other site [facility])     on Lupron injections per GU  . Osteoarthritis of both feet   . S/P radiation therapy 02/13/14-02/27/14    SRS 5/5 spine completed 02/27/14  . S/P radiation therapy 05/27/14-05/31/14    right femur 20Gy/27f    Past Surgical History  Procedure Laterality Date  . Nasal sinus surgery  1998  . Cataract extraction w/ intraocular lens  implant, bilateral Bilateral ~ 2011  . Skin graft Right 2010  . Melanoma excision  2010    "pre-melanoma on top of head; did skin graft from left thigh to cover" (09/27/2012)  . Tonsillectomy  ~ 1935    Family History  Problem Relation Age of Onset  . Heart failure Mother   . Heart attack Father    Social History:  reports that he quit smoking about 61 years ago. His smoking use included Cigarettes. He quit after .5 years of use. He has never used smokeless tobacco. He reports that he drinks alcohol. He reports that he does not use illicit drugs.  Allergies:  Allergies  Allergen Reactions  . Pravachol Other (See Comments)    Muscle weakness  . Zocor [Simvastatin - High Dose] Other (See Comments)    Muscle weakness    Medications:  No current  facility-administered medications on file prior to encounter.   Current Outpatient Prescriptions on File Prior to Encounter  Medication Sig Dispense Refill  . acetaminophen (TYLENOL) 500 MG tablet Take 1,000 mg by mouth 2 (two) times daily. Takes two tablets in the morning and two tablets at night    . amLODipine (NORVASC) 5 MG tablet Take 1 tablet (5 mg total) by mouth daily. 90 tablet 3  . atorvastatin (LIPITOR) 10 MG tablet TAKE 1 TABLET BY MOUTH ONCE DAILY 30 tablet 6  . B Complex-C (B-COMPLEX WITH VITAMIN C) tablet Take 1 tablet by mouth daily.      . Denosumab (XGEVA [place]) Inject into the skin every 30 (thirty) days. Monthly. Last inj was 8-19- 2016. Wife not sure what dose it is    . docusate sodium (COLACE) 100 MG capsule Take 100 mg by mouth 2 (two) times daily.    . enzalutamide (XTANDI) 40 MG capsule Take 1 capsule (40 mg total) by mouth daily. 30 capsule 1  . furosemide (LASIX) 40 MG tablet Take 1 tablet (40 mg total) by mouth daily. 90 tablet 3  . Leuprolide Acetate (LUPRON DEPOT IM) Inject 1 each into the muscle every 6 (six) months. Last inj was in March 2016    . losartan (COZAAR) 100 MG tablet TAKE 1 TABLET BY MOUTH ONCE DAILY 30 tablet 11  . multivitamin (THERAGRAN) per tablet Take 1 tablet by mouth daily. ( with B - Complex )    . Omega-3 Fatty Acids (FISH OIL PO) Take 1 capsule by mouth daily. ( Mega Red )    . tamsulosin (FLOMAX) 0.4 MG CAPS capsule Take 1 capsule (0.4 mg total) by mouth daily. 30 capsule 2  . warfarin (COUMADIN) 5 MG tablet TAKE AS DIRECTED BY COUMADIN CLINIC (Patient taking differently: TAKE AS DIRECTED BY COUMADIN CLINIC  34m Monday and Thursday and 2.544mall other days) 30 tablet 3  . ZETIA 10 MG tablet TAKE 1 TABLET BY MOUTH DAILY 30 tablet 5    ROS: Review of Systems: ROS was attempted today and was able to be performed.  Systems assessed include - Constitutional, Eyes, HENT, Respiratory, Cardiovascular, Gastrointestinal, Genitourinary,  Integument/breast, Hematologic/lymphatic, Musculoskeletal, Neurological, Behavioral/Psych, Endocrine,  Allergic/Immunologic - the patient complains of only the following symptoms, and all other reviewed systems are negative.  Physical Examination:  Temp:  [98.2 F (36.8 C)] 98.2 F (36.8 C) (11/08 1501) Pulse Rate:  [95-98] 98 (11/08 1500) Resp:  [13-20] 13 (11/08 1500) BP: (193-217)/(102-120) 217/102 mmHg (11/08 1500) SpO2:  [96 %-99 %] 96 % (11/08 1500) Weight:  [211 lb 3.2 oz (95.8 kg)] 211 lb 3.2 oz (95.8 kg) (11/08 1404)  General - Well nourished, well developed, in no apparent distress.  Ophthalmologic - Fundi not visualized due to noncooperation.  Cardiovascular - irregularly irregular heart rate and rhythm.  Mental Status -  Level of arousal and orientation to place, and person were intact, but not orientated to time. Language exam showed intact comprehension, partial expressive aphasia, naming 2/5,  impaired repetition.  Cranial Nerves II - XII - II - left upper quadrant quadrantopia.  III, IV, VI - Extraocular movements intact. V - Facial sensation intact bilaterally. VII - subtle right nasolabial fold flattening VIII - Hearing & vestibular intact bilaterally. X - Palate elevates symmetrically. XI - Chin turning & shoulder shrug intact bilaterally. XII - Tongue protrusion intact.  Motor Strength - The patient's strength was normal in all extremities except right lower extremity 4+/5 proximal due to right hip pain and pronator drift was absent.  Bulk was normal and fasciculations were absent.   Motor Tone - Muscle tone was assessed at the neck and appendages and was normal.  Reflexes - The patient's reflexes were 1+ in all extremities and he had no pathological reflexes.  Sensory - Light touch, temperature/pinprick were assessed and were symmetrical.    Coordination - The patient had normal movements in the hands with no ataxia or dysmetria.  Tremor was  absent.  Gait and Station - not tested.  NIH Stroke Scale  Level Of Consciousness 0=Alert; keenly responsive 1=Not alert, but arousable by minor stimulation 2=Not alert, requires repeated stimulation 3=Responds only with reflex movements 0  LOC Questions to Month and Age 58=Answers both questions correctly 1=Answers one question correctly 2=Answers neither question correctly 1  LOC Commands      -Open/Close eyes     -Open/close grip 0=Performs both tasks correctly 1=Performs one task correctly 2=Performs neighter task correctly 1  Best Gaze 0=Normal 1=Partial gaze palsy 2=Forced deviation, or total gaze paresis 0  Visual 0=No visual loss 1=Partial hemianopia 2=Complete hemianopia 3=Bilateral hemianopia (blind including cortical blindness) 1  Facial Palsy 0=Normal symmetrical movement 1=Minor paralysis (asymmetry) 2=Partial paralysis (lower face) 3=Complete paralysis (upper and lower face) 1  Motor  0=No drift, limb holds posture for full 10 seconds 1=Drift, limb holds posture, no drift to bed 2=Some antigravity effort, cannot maintain posture, drifts to bed 3=No effort against gravity, limb falls 4=No movement Right Arm 0     Leg 0    Left Arm 0     Leg 0  Limb Ataxia 0=Absent 1=Present in one limb 2=Present in two limbs 0  Sensory 0=Normal 1=Mild to moderate sensory loss 2=Severe to total sensory loss 0  Best Language 0=No aphasia, normal 1=Mild to moderate aphasia 2=Mute, global aphasia 3=Mute, global aphasia 1  Dysarthria 0=Normal 1=Mild to moderate 2=Severe, unintelligible or mute/anarthric 1  Extinction/Neglect 0=No abnormality 1=Extinction to bilateral simultaneous stimulation 2=Profound neglect 0  Total   6     Results for orders placed or performed during the hospital encounter of 12/31/14 (from the past 48 hour(s))  Ethanol     Status: None   Collection Time: 12/31/14  1:43 PM  Result Value Ref Range   Alcohol, Ethyl (B) <5 <5 mg/dL    Comment:         LOWEST DETECTABLE LIMIT FOR SERUM ALCOHOL IS 5 mg/dL FOR MEDICAL PURPOSES ONLY   Protime-INR     Status: Abnormal   Collection Time: 12/31/14  1:43 PM  Result Value Ref Range   Prothrombin Time 19.6 (H) 11.6 - 15.2 seconds   INR 1.66 (H) 0.00 - 1.49  APTT     Status: None   Collection Time: 12/31/14  1:43 PM  Result Value Ref Range   aPTT 34 24 - 37 seconds  CBC     Status: None   Collection Time: 12/31/14  1:43 PM  Result Value Ref Range   WBC 9.0  4.0 - 10.5 K/uL   RBC 4.39 4.22 - 5.81 MIL/uL   Hemoglobin 14.2 13.0 - 17.0 g/dL   HCT 42.9 39.0 - 52.0 %   MCV 97.7 78.0 - 100.0 fL   MCH 32.3 26.0 - 34.0 pg   MCHC 33.1 30.0 - 36.0 g/dL   RDW 14.8 11.5 - 15.5 %   Platelets 154 150 - 400 K/uL  Differential     Status: None   Collection Time: 12/31/14  1:43 PM  Result Value Ref Range   Neutrophils Relative % 67 %   Neutro Abs 6.0 1.7 - 7.7 K/uL   Lymphocytes Relative 23 %   Lymphs Abs 2.1 0.7 - 4.0 K/uL   Monocytes Relative 8 %   Monocytes Absolute 0.7 0.1 - 1.0 K/uL   Eosinophils Relative 2 %   Eosinophils Absolute 0.2 0.0 - 0.7 K/uL   Basophils Relative 0 %   Basophils Absolute 0.0 0.0 - 0.1 K/uL  Comprehensive metabolic panel     Status: Abnormal   Collection Time: 12/31/14  1:43 PM  Result Value Ref Range   Sodium 138 135 - 145 mmol/L   Potassium 4.1 3.5 - 5.1 mmol/L   Chloride 102 101 - 111 mmol/L   CO2 26 22 - 32 mmol/L   Glucose, Bld 128 (H) 65 - 99 mg/dL   BUN 37 (H) 6 - 20 mg/dL   Creatinine, Ser 1.34 (H) 0.61 - 1.24 mg/dL   Calcium 10.2 8.9 - 10.3 mg/dL   Total Protein 6.6 6.5 - 8.1 g/dL   Albumin 3.5 3.5 - 5.0 g/dL   AST 21 15 - 41 U/L   ALT 14 (L) 17 - 63 U/L   Alkaline Phosphatase 56 38 - 126 U/L   Total Bilirubin 0.8 0.3 - 1.2 mg/dL   GFR calc non Af Amer 46 (L) >60 mL/min   GFR calc Af Amer 53 (L) >60 mL/min    Comment: (NOTE) The eGFR has been calculated using the CKD EPI equation. This calculation has not been validated in all clinical  situations. eGFR's persistently <60 mL/min signify possible Chronic Kidney Disease.    Anion gap 10 5 - 15  I-stat troponin, ED (not at [facility])     Status: None   Collection Time: 12/31/14  1:44 PM  Result Value Ref Range   Troponin i, poc 0.00 0.00 - 0.08 ng/mL   Comment 3            Comment: Due to the release kinetics of cTnI, a negative result within the first hours of the onset of symptoms does not rule out myocardial infarction with certainty. If myocardial infarction is still suspected, repeat the test at appropriate intervals.   I-Stat Chem 8, ED  (not at [facility])     Status: Abnormal   Collection Time: 12/31/14  1:54 PM  Result Value Ref Range   Sodium 141 135 - 145 mmol/L   Potassium 4.1 3.5 - 5.1 mmol/L   Chloride 102 101 - 111 mmol/L   BUN 40 (H) 6 - 20 mg/dL   Creatinine, Ser 1.40 (H) 0.61 - 1.24 mg/dL   Glucose, Bld 124 (H) 65 - 99 mg/dL   Calcium, Ion 1.21 1.13 - 1.30 mmol/L   TCO2 27 0 - 100 mmol/L   Hemoglobin 15.6 13.0 - 17.0 g/dL   HCT 46.0 39.0 - 52.0 %   Ct Head Wo Contrast  12/31/2014   IMPRESSION: Chronic ischemic changes are stable from the  prior study. No acute abnormality.    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 Exam showed subtle right nasolabial fold flattening and partial expressive aphasia. NIHSS = 6, INR 1.66. After discussion with wife at bedside, decision made not to give TPA due to time window between 3-4.5 hours and age 83, INR 1.66 with increased dose last night, nose bleeding requiring blood transfusion on Coumadin, currently INR goal 1.8 - 2.2, history of TPA related hemorrhagic transformation.  Stroke  Risk Factors - atrial fibrillation, hypercoagulable state, hyperlipidemia and hypertension  Plan: 1. HgbA1c, fasting lipid panel 2. MRI, MRA of the brain without contrast 3. PT consult, OT consult, Speech consult 4. Echocardiogram 5. Carotid dopplers 6. Prophylactic therapy-Anticoagulation: Coumadin with INR goal 1.8-2.2 7. Risk factor modification 8. Telemetry monitoring 9. Frequent neuro checks 10. Admit to medicine and we will follow  This patient is critically ill due to acute ischemic stroke, afib with subtherapeutic INR and at significant risk of neurological worsening, death form recurrent stroke, hemorrhagic transformation, heart failure, Afib RVR. This patient's care requires constant monitoring of vital signs, hemodynamics, respiratory and cardiac monitoring, review of multiple databases, neurological assessment, discussion with family, other specialists and medical decision making of high complexity. I spent 50 minutes of neurocritical care time in the care of this patient.  Rosalin Hawking, MD PhD Stroke Neurology 12/31/2014 3:22 PM

## 2014-12-31 NOTE — Progress Notes (Signed)
ANTICOAGULATION CONSULT NOTE - Initial Consult  Pharmacy Consult for Coumadin Indication: atrial fibrillation  Allergies  Allergen Reactions  . Pravachol Other (See Comments)    Muscle weakness  . Zocor [Simvastatin - High Dose] Other (See Comments)    Muscle weakness    Patient Measurements: Weight: 211 lb 3.2 oz (95.8 kg)   Vital Signs: Temp: 98.2 F (36.8 C) (11/08 1501) Temp Source: Oral (11/08 1404) BP: 211/105 mmHg (11/08 1600) Pulse Rate: 70 (11/08 1600)  Labs:  Recent Labs  12/30/14 1328 12/31/14 1343 12/31/14 1354  HGB  --  14.2 15.6  HCT  --  42.9 46.0  PLT  --  154  --   APTT  --  34  --   LABPROT  --  19.6*  --   INR 1.7 1.66*  --   CREATININE  --  1.34* 1.40*    Estimated Creatinine Clearance: 41.2 mL/min (by C-G formula based on Cr of 1.4).   Medical History: Past Medical History  Diagnosis Date  . Hyperlipidemia   . Hypokalemia   . Hypertensive heart disease   . Chronic back pain   . History of epistaxis 07/19/2002  . Personal history of long-term (current) use of anticoagulants   . ICH (intracerebral hemorrhage) ([place]) ~ 1999    after TPA/notes 09/27/2012  . Hypertension   . Atrial fibrillation ([place])   . PAT (paroxysmal atrial tachycardia) ([place])   . Pneumonia     "once; several years ago" (09/27/2012)  . GERD (gastroesophageal reflux disease)   . H/O hiatal hernia   . Migraines     "migraines without headaches years ago" (09/27/2012)  . Stroke [facility]) ~ 1999    ischemic / right cerebellar/posterior inferior cerebellar artery / right pos infarct  . Melanoma ([place])     "top of my head" (09/27/2012)  . Prostate cancer, primary, with metastasis from prostate to other site [facility])     on Lupron injections per GU  . Osteoarthritis of both feet   . S/P radiation therapy 02/13/14-02/27/14    SRS 5/5 spine completed 02/27/14  . S/P radiation therapy 05/27/14-05/31/14    right femur 20Gy/70fx    Assessment: 81 year old male on Coumadin PTA for AFib.   Admitted with new CVA.  INR sub-therapeutic on admission at 1.66 Dose PTA = 5 mg Monday and Thursday, 2.5 mg other days (last dose 11.7)  Started Rocephin 1 gram iv Q 24 hours for possible UTI  Goal of Therapy:  INR 2-3 Monitor platelets by anticoagulation protocol: Yes   Plan:  Coumadin 7.5 mg po x 1 dose tonight Daily INR  Thank you Anette Guarneri, PharmD [phone]  12/31/2014,5:50 PM

## 2014-12-31 NOTE — ED Notes (Signed)
Attempted report to 5M.  

## 2015-01-01 ENCOUNTER — Observation Stay (HOSPITAL_BASED_OUTPATIENT_CLINIC_OR_DEPARTMENT_OTHER): Payer: Medicare Other

## 2015-01-01 DIAGNOSIS — I5032 Chronic diastolic (congestive) heart failure: Secondary | ICD-10-CM | POA: Diagnosis not present

## 2015-01-01 DIAGNOSIS — I6789 Other cerebrovascular disease: Secondary | ICD-10-CM | POA: Diagnosis not present

## 2015-01-01 DIAGNOSIS — R2990 Unspecified symptoms and signs involving the nervous system: Secondary | ICD-10-CM

## 2015-01-01 DIAGNOSIS — I255 Ischemic cardiomyopathy: Secondary | ICD-10-CM | POA: Insufficient documentation

## 2015-01-01 DIAGNOSIS — I482 Chronic atrial fibrillation: Secondary | ICD-10-CM | POA: Diagnosis not present

## 2015-01-01 DIAGNOSIS — N183 Chronic kidney disease, stage 3 (moderate): Secondary | ICD-10-CM

## 2015-01-01 DIAGNOSIS — I63412 Cerebral infarction due to embolism of left middle cerebral artery: Secondary | ICD-10-CM

## 2015-01-01 DIAGNOSIS — I639 Cerebral infarction, unspecified: Secondary | ICD-10-CM | POA: Insufficient documentation

## 2015-01-01 DIAGNOSIS — G451 Carotid artery syndrome (hemispheric): Secondary | ICD-10-CM | POA: Insufficient documentation

## 2015-01-01 DIAGNOSIS — Z8673 Personal history of transient ischemic attack (TIA), and cerebral infarction without residual deficits: Secondary | ICD-10-CM

## 2015-01-01 LAB — CBC
HCT: 39.6 % (ref 39.0–52.0)
HEMOGLOBIN: 12.9 g/dL — AB (ref 13.0–17.0)
MCH: 32 pg (ref 26.0–34.0)
MCHC: 32.6 g/dL (ref 30.0–36.0)
MCV: 98.3 fL (ref 78.0–100.0)
Platelets: 149 10*3/uL — ABNORMAL LOW (ref 150–400)
RBC: 4.03 MIL/uL — ABNORMAL LOW (ref 4.22–5.81)
RDW: 14.9 % (ref 11.5–15.5)
WBC: 8.3 10*3/uL (ref 4.0–10.5)

## 2015-01-01 LAB — LIPID PANEL
CHOL/HDL RATIO: 3.3 ratio
Cholesterol: 146 mg/dL (ref 0–200)
HDL: 44 mg/dL (ref >40)
LDL CALC: 87 mg/dL (ref 0–99)
Triglycerides: 75 mg/dL (ref <150)
VLDL: 15 mg/dL (ref 0–40)

## 2015-01-01 LAB — PROTIME-INR
INR: 1.71 — AB (ref 0.00–1.49)
Prothrombin Time: 20.1 seconds — ABNORMAL HIGH (ref 11.6–15.2)

## 2015-01-01 LAB — HEPARIN LEVEL (UNFRACTIONATED)
HEPARIN UNFRACTIONATED: 0.17 [IU]/mL — AB (ref 0.30–0.70)
Heparin Unfractionated: 0.22 IU/mL — ABNORMAL LOW (ref 0.30–0.70)

## 2015-01-01 LAB — GLUCOSE, CAPILLARY
GLUCOSE-CAPILLARY: 100 mg/dL — AB (ref 65–99)
GLUCOSE-CAPILLARY: 103 mg/dL — AB (ref 65–99)
Glucose-Capillary: 169 mg/dL — ABNORMAL HIGH (ref 65–99)

## 2015-01-01 MED ORDER — WARFARIN SODIUM 6 MG PO TABS
6.0000 mg | ORAL_TABLET | Freq: Once | ORAL | Status: DC
  Filled 2015-01-01: qty 1

## 2015-01-01 MED ORDER — FAMOTIDINE 10 MG PO TABS: 10.0000 mg | ORAL_TABLET | Freq: Every day | ORAL | Status: DC

## 2015-01-01 NOTE — Discharge Summary (Signed)
Name: Craig [name]. MRN: [id number] DOB: Jul 12, 1924 81 y.o. PCP: No Pcp Per Patient  Date of Admission: 12/31/2014  1:41 PM Date of Discharge: 01/01/2015 Attending Physician: Craig Riches, Craig  Discharge Diagnosis: Principal Problem:   Transient Ischemic Attack (TIA) Active Problems:   Chronic atrial fibrillation (HCC)   Prostate cancer metastatic to bone (HCC)   Chronic kidney disease (CKD), stage III (moderate)   Chronic diastolic heart failure (HCC)   Essential hypertension   History of [name]   Expressive aphasia   Catheter-associated urinary tract infection (Craig [name])   [name])  Discharge Medications:   Medication List    TAKE these medications        acetaminophen 500 MG tablet  Commonly known as:  TYLENOL  Take 1,000 mg by mouth 2 (two) times daily. Takes two tablets in the morning and two tablets at night     amLODipine 5 MG tablet  Commonly known as:  NORVASC  Take 1 tablet (5 mg total) by mouth daily.     atorvastatin 10 MG tablet  Commonly known as:  LIPITOR  TAKE 1 TABLET BY MOUTH ONCE DAILY     B-complex with vitamin C tablet  Take 1 tablet by mouth daily.     docusate sodium 100 MG capsule  Commonly known as:  COLACE  Take 100 mg by mouth 2 (two) times daily.     enzalutamide 40 MG capsule  Commonly known as:  XTANDI  Take 1 capsule (40 mg total) by mouth daily.     FISH OIL PO  Take 1 capsule by mouth daily. ( Mega Red )     furosemide 40 MG tablet  Commonly known as:  LASIX  Take 1 tablet (40 mg total) by mouth daily.     losartan 100 MG tablet  Commonly known as:  COZAAR  TAKE 1 TABLET BY MOUTH ONCE DAILY     LUPRON DEPOT IM  Inject 1 each into the muscle every 6 (six) months. Last inj was in March 2016     multivitamin per tablet  Take 1 tablet by mouth daily. ( with B - Complex )     ranitidine 150 MG tablet  Commonly known as:  ZANTAC  Take 150 mg by mouth at bedtime.     tamsulosin 0.4 MG Caps capsule  Commonly  known as:  FLOMAX  Take 1 capsule (0.4 mg total) by mouth daily.     warfarin 5 MG tablet  Commonly known as:  COUMADIN  TAKE AS DIRECTED BY COUMADIN CLINIC     XGEVA Parrish  Inject into the skin every 30 (thirty) days. Monthly. Last inj was 8-19- 2016. Wife not sure what dose it is     ZETIA 10 MG tablet  Generic drug:  ezetimibe  TAKE 1 TABLET BY MOUTH DAILY        Disposition and follow-up:   Mr.Craig [name]. was [facility] from [facility] in Good condition.  At the hospital follow up visit please address:  1. TIA: [name] work up showed no new areas of infarct on MRI or evidence of thrombosis in carotid arteries or on echocardiogram. LVEF estimation was decreased compared to previous study in 03/2014 but he has no recent symptoms of heart failure or hypotension. Already is on therapy for cholesterol, his prostate CA, and is on coumadin anticoagulation. Of note both his recent TIA episodes have occurred with a subtherapeutic INR below his prescribed goal  of 1.8-2.2. This is the only therapy that could probably see better optimization, and he recently had his coumadin dose titrated by Dr. Mare [name] 11/7 and had not yet seen the response to this change.  2. Labs / imaging needed at time of follow-up: PT/INR  Follow-up Appointments:     Follow-up Information    Follow up with Craig Danes, Craig. Schedule an appointment as soon as possible for a visit in 2 weeks.   Specialty:  Cardiology   Why:  Within 2 weeks for hospital follow up,    Contact information:   [address] [place] [address] [phone]       Discharge Instructions:   Consultations: Treatment Team:  Craig Stroke, Craig  Procedures Performed:  Ct Head Wo Contrast  12/31/2014  CLINICAL DATA:  Code [name]. Right-sided weakness. Facial droop and slurred speech. EXAM: CT HEAD WITHOUT CONTRAST TECHNIQUE: Contiguous axial images were obtained from the base of the skull through the  vertex without intravenous contrast. COMPARISON:  MRI 04/19/2014, CT head 04/19/2014 FINDINGS: Negative for acute hemorrhage.  No acute infarct Chronic infarct throughout most of the right cerebellum. Chronic infarct right pons. Mild chronic ischemia left cerebellum. Chronic ischemic in the cerebral white matter right greater than left. Ventricle size normal. Cerebral volume normal for age. Negative for mass lesion. No shift of the midline structures Calvarium intact. Atherosclerotic calcification in the vertebral arteries bilaterally right greater than left. Atherosclerotic calcification in the carotid arteries bilaterally. IMPRESSION: Chronic ischemic changes are stable from the prior study. No acute abnormality. Critical Value/emergent results were called by telephone at the time of interpretation on 12/31/2014 at 2:08 pm to Dr. Armida [name] , who verbally acknowledged these results. Electronically Signed   By: Craig [name] M.D.   On: 12/31/2014 14:09   Mr Craig [name] Head Wo Contrast  12/31/2014  CLINICAL DATA:  [name]. Atrial fibrillation on Coumadin. Received tPA. Right facial droop with dysarthria. EXAM: MRI HEAD WITHOUT CONTRAST MRA HEAD WITHOUT CONTRAST TECHNIQUE: Multiplanar, multiecho pulse sequences of the brain and surrounding structures were obtained without intravenous contrast. Angiographic images of the head were obtained using MRA technique without contrast. COMPARISON:  CT 12/31/2014.  MRI 04/19/2014 FINDINGS: MRI HEAD FINDINGS Negative for acute infarct. Chronic infarct throughout much of the right cerebellar hemisphere involving inferior and superior cerebellum. Chronic blood products in the right cerebellum unchanged from the prior study. There is atrophy of the brachium pontis on the right. Hyperintensity in the right pons compatible with chronic infarct. Small area of chronic infarct in the left cerebellum. Mild chronic microvascular ischemic change in the white matter. Negative for hydrocephalus.  Negative for mass or edema.  No shift of the midline structures. Abnormal signal right vertebral artery compatible chronic occlusion. This is unchanged. Mild mucosal edema paranasal sinuses.  Pituitary not enlarged. MRA HEAD FINDINGS Chronic occlusion right vertebral artery unchanged. Left vertebral artery appears normal. Left PICA patent. Basilar widely patent. Superior cerebellar and posterior cerebral arteries widely patent bilaterally. Internal carotid artery widely patent bilaterally. Anterior middle cerebral arteries appear normal. Negative for aneurysm. IMPRESSION: Negative for acute infarct. Chronic ischemic changes are stable since prior imaging studies. Chronic occlusion right vertebral artery is unchanged. Otherwise no significant intracranial stenosis. Electronically Signed   By: Craig [name] M.D.   On: 12/31/2014 20:22   Mr Brain Wo Contrast  12/31/2014  CLINICAL DATA:  [name]. Atrial fibrillation on Coumadin. Received tPA. Right facial droop with dysarthria. EXAM: MRI HEAD WITHOUT CONTRAST MRA HEAD  WITHOUT CONTRAST TECHNIQUE: Multiplanar, multiecho pulse sequences of the brain and surrounding structures were obtained without intravenous contrast. Angiographic images of the head were obtained using MRA technique without contrast. COMPARISON:  CT 12/31/2014.  MRI 04/19/2014 FINDINGS: MRI HEAD FINDINGS Negative for acute infarct. Chronic infarct throughout much of the right cerebellar hemisphere involving inferior and superior cerebellum. Chronic blood products in the right cerebellum unchanged from the prior study. There is atrophy of the brachium pontis on the right. Hyperintensity in the right pons compatible with chronic infarct. Small area of chronic infarct in the left cerebellum. Mild chronic microvascular ischemic change in the white matter. Negative for hydrocephalus. Negative for mass or edema.  No shift of the midline structures. Abnormal signal right vertebral artery compatible chronic  occlusion. This is unchanged. Mild mucosal edema paranasal sinuses.  Pituitary not enlarged. MRA HEAD FINDINGS Chronic occlusion right vertebral artery unchanged. Left vertebral artery appears normal. Left PICA patent. Basilar widely patent. Superior cerebellar and posterior cerebral arteries widely patent bilaterally. Internal carotid artery widely patent bilaterally. Anterior middle cerebral arteries appear normal. Negative for aneurysm. IMPRESSION: Negative for acute infarct. Chronic ischemic changes are stable since prior imaging studies. Chronic occlusion right vertebral artery is unchanged. Otherwise no significant intracranial stenosis. Electronically Signed   By: Craig [name] M.D.   On: 12/31/2014 20:22   2D Echo: LV EF: 35% -  40% ------------------------------------------------------------------- Indications:   CVA 436. ------------------------------------------------------------------- History:  PMH: Pulmonary Embolus. Former Smoker, AAA. Atrial fibrillation. Risk factors: Hypertension. ------------------------------------------------------------------- Study Conclusions  - Left ventricle: The cavity size was normal. Wall thickness was increased in a pattern of moderate LVH. Systolic function was moderately reduced. The estimated ejection fraction was in the range of 35% to 40%. Wall motion was normal; there were no regional wall motion abnormalities. - Aortic valve: There was mild stenosis. There was mild regurgitation. Valve area (VTI): 0.81 cm^2. Valve area (Vmax): 0.9 cm^2. Valve area (Vmean): 0.88 cm^2. - Mitral valve: There was mild regurgitation. - Left atrium: The atrium was mildly dilated. - Right ventricle: Systolic function was mildly reduced. - Right atrium: The atrium was mildly dilated.  Transthoracic echocardiography. M-mode, complete 2D, spectral Doppler, and color Doppler. Birthdate: Patient birthdate: 11/23/1924. Age: Patient is 81 yr  old. Sex: Gender: male. Blood pressure:   178/83 Patient status: Inpatient. Study date: Study date: 01/01/2015. Study time: 08:59 AM. Location: Bedside. ------------------------------------------------------------------- Left ventricle: The cavity size was normal. Wall thickness was increased in a pattern of moderate LVH. Systolic function was moderately reduced. The estimated ejection fraction was in the range of 35% to 40%. Wall motion was normal; there were no regional wall motion abnormalities. The study was not technically sufficient to allow evaluation of LV diastolic dysfunction due to atrial fibrillation. ------------------------------------------------------------------- Aortic valve:  Trileaflet; moderately thickened, moderately calcified leaflets. Doppler:  There was mild stenosis.  There was mild regurgitation.  VTI ratio of LVOT to aortic valve: 0.26. Valve area (VTI): 0.81 cm^2. Peak velocity ratio of LVOT to aortic valve: 0.29. Valve area (Vmax): 0.9 cm^2. Mean velocity ratio of LVOT to aortic valve: 0.28. Valve area (Vmean): 0.88 cm^2.  Mean gradient (S): 10 mm Hg. Peak gradient (S): 22 mm Hg. ------------------------------------------------------------------- Aorta: The aorta was normal, not dilated, and non-diseased. ------------------------------------------------------------------- Mitral valve:  Mildly thickened leaflets . Doppler: There was mild regurgitation. ------------------------------------------------------------------- Left atrium: The atrium was mildly dilated. ------------------------------------------------------------------- Atrial septum: Poorly visualized. ------------------------------------------------------------------- Right ventricle: The cavity size was normal. Systolic function was mildly reduced. ------------------------------------------------------------------- Pulmonic valve:  Structurally normal valve.  Cusp  separation was normal. Doppler: Transvalvular velocity was within the normal range. There was trivial regurgitation. ------------------------------------------------------------------- Tricuspid valve: Poorly visualized. Doppler: There was no significant regurgitation. ------------------------------------------------------------------- Right atrium: The atrium was mildly dilated. ------------------------------------------------------------------- Pericardium: There was no pericardial effusion. ------------------------------------------------------------------- Post procedure conclusions Ascending Aorta:  - The aorta was normal, not dilated, and non-diseased. -------------------------------------------------------------------  Admission HPI: 81 year old male retired [employer] with history of hypertension, hyperlipidemia, atrial fibrillation on Coumadin, previous [name] in 2002 with ICH status post TPA, prostate cancer with bone metastases to right pelvis and femur, and TIA 03/2014 presenting to emergency department from home with right facial droop and dysarthria. 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 He was slightly subtherapeutic at INR 1.7 at that time. His wife observed him weaker than normal during his morning routine and progressive dysarthria starting around 10:30 AM. By 11 AM he had phone conversation with his urologist during which speech was obviously garbled.  He was transported via EMS and on admission was hypertensive to 200/100 with ongoing atrial fibrillation. CT head demonstrated no acute ICH. Initial lab studies unremarkable except for urinalysis showing many bacteria plus leukocytes and nitrates consistent with his chronically indwelling Foley catheter since February of this year. According to his wife and pastor who was present his  facial droop and speech have improved partially since their onset this morning.  Hospital Course by problem list: Transient Ischemic Attack (TIA) Admitted with acute onset of dysarthria, mild R facial droop, and possibly a transient component of generalized weakness. He was not a candidate for tPA therapy due to relatively low symptom severity and history of ICH. MRI obtained showing no acute changes. Carotid ultrasound did not demonstrate significant plaques. He remained in chronic Afib throughout admission consistent with his baseline rhythm. TTE obtained showing no new areas of thrombus or wall motility defect. Estimated LVEF was decreased to 35-40% from previous 55-60% in 03/2014, but further workup deferred as he is asymptomatic and this variation is probably not a driving factor for his TIA given his extensive underlying comorbidities. His symptoms were fully resolved by the afternoon on Hospital day 1. Case was discussed with his Cardiologist Dr. Mare [name] and discharged with plan for outpatient follow up.  Discharge Vitals:   BP 151/58 mmHg  Pulse 53  Temp(Src) 97 F (36.1 C) (Oral)  Resp 16  Wt 95.8 kg (211 lb 3.2 oz)  SpO2 96%  Discharge Labs:  Results for orders placed or performed during the hospital encounter of 12/31/14 (from the past 24 hour(s))  Glucose, capillary     Status: Abnormal   Collection Time: 12/31/14  8:19 PM  Result Value Ref Range   Glucose-Capillary 109 (H) 65 - 99 mg/dL   Comment 1 Notify RN    Comment 2 Document in Chart   Glucose, capillary     Status: Abnormal   Collection Time: 12/31/14 11:37 PM  Result Value Ref Range   Glucose-Capillary 106 (H) 65 - 99 mg/dL   Comment 1 Notify RN    Comment 2 Document in Chart   Lipid panel     Status: None   Collection Time: 01/01/15  2:16 AM  Result Value Ref Range   Cholesterol 146 0 - 200 mg/dL   Triglycerides 75 <150 mg/dL   HDL 44 >40 mg/dL   Total CHOL/HDL Ratio 3.3 RATIO   VLDL 15  0 - 40 mg/dL   LDL  Cholesterol 87 0 - 99 mg/dL  Glucose, capillary     Status: Abnormal   Collection Time: 01/01/15  4:17 AM  Result Value Ref Range   Glucose-Capillary 100 (H) 65 - 99 mg/dL   Comment 1 Notify RN    Comment 2 Document in Chart   Protime-INR     Status: Abnormal   Collection Time: 01/01/15  7:30 AM  Result Value Ref Range   Prothrombin Time 20.1 (H) 11.6 - 15.2 seconds   INR 1.71 (H) 0.00 - 1.49  Heparin level (unfractionated)     Status: Abnormal   Collection Time: 01/01/15  7:30 AM  Result Value Ref Range   Heparin Unfractionated 0.17 (L) 0.30 - 0.70 IU/mL  CBC     Status: Abnormal   Collection Time: 01/01/15  7:30 AM  Result Value Ref Range   WBC 8.3 4.0 - 10.5 K/uL   RBC 4.03 (L) 4.22 - 5.81 MIL/uL   Hemoglobin 12.9 (L) 13.0 - 17.0 g/dL   HCT 39.6 39.0 - 52.0 %   MCV 98.3 78.0 - 100.0 fL   MCH 32.0 26.0 - 34.0 pg   MCHC 32.6 30.0 - 36.0 g/dL   RDW 14.9 11.5 - 15.5 %   Platelets 149 (L) 150 - 400 K/uL  Glucose, capillary     Status: Abnormal   Collection Time: 01/01/15 12:50 PM  Result Value Ref Range   Glucose-Capillary 169 (H) 65 - 99 mg/dL  Heparin level (unfractionated)     Status: Abnormal   Collection Time: 01/01/15  2:57 PM  Result Value Ref Range   Heparin Unfractionated 0.22 (L) 0.30 - 0.70 IU/mL   Signed: Collier Salina, Craig 01/04/2015, 8:52 PM   Services Ordered on Discharge: Home health PT, RN, Aide

## 2015-01-01 NOTE — Progress Notes (Signed)
RN discussed discharge instructions with patient and wife, patient and wife vocalized understanding. Instructions given to patient's wife. Per patient's wife, patient has warfarin rx at home, per MD, ok to be taken. Will continue to monitor until transportation arrives. Patient to be escorted by staff to wife's car. Neuro assessment unchanged, NIHHS unchanged.

## 2015-01-01 NOTE — Progress Notes (Signed)
Subjective: Continues to improve from overnight, with less R facial droop and slurring compared to admission. Underwent MRI that showed no new stroke. TTE, Carotid doppler US obtained and results pending. Swallow function was appropriate per SLP and has resumed diet. Patient and family discussed that he would have 24 hr support at home when appropriate for discharge.  Objective: Vital signs in last 24 hours: Filed Vitals:   01/01/15 0000 01/01/15 0200 01/01/15 0400 01/01/15 0600  BP: 185/89 169/90 161/90 178/83  Pulse: 84 62 69 71  Temp:  100 F (37.8 C) 100.2 F (37.9 C)   TempSrc:  Oral Oral Oral  Resp: 16 16 15 14   Weight:      SpO2: 93% 97% 96% 97%   Weight change:   Intake/Output Summary (Last 24 hours) at 01/01/15 1059 Last data filed at 01/01/15 0842  Gross per 24 hour  Intake      0 ml  Output   1200 ml  Net  -1200 ml   GENERAL- alert, co-operative, NAD HEENT- Atraumatic, PERRL, EOMI, slight droop to R mouth, arcus senilis b/l, oral mucosa appears moist, fair dentition, no cervical LN enlargement. CARDIAC- Regular rate, irregular rhythm, no murmurs, rubs or gallops. RESP- CTAB, no wheezes or crackles. ABDOMEN- Soft, nontender, no guarding or rebound NEURO- No obvious Cr N abnormality, strength upper and lower extremities- 5/5, Sensation intact- globally, speech dysarthric but mostly intelligible EXTREMITIES- pulse 2+, symmetric, no pedal edema. SKIN- Warm, dry, No rash or lesion. PSYCH- Normal mood and affect, appropriate thought content and speech.  Lab Results: Basic Metabolic Panel:  Recent Labs Lab 12/31/14 1343 12/31/14 1354  NA 138 141  K 4.1 4.1  CL 102 102  CO2 26  --   GLUCOSE 128* 124*  BUN 37* 40*  CREATININE 1.34* 1.40*  CALCIUM 10.2  --    Liver Function Tests:  Recent Labs Lab 12/31/14 1343  AST 21  ALT 14*  ALKPHOS 56  BILITOT 0.8  PROT 6.6  ALBUMIN 3.5   No results for input(s): LIPASE, AMYLASE in the last 168 hours. No  results for input(s): AMMONIA in the last 168 hours. CBC:  Recent Labs Lab 12/31/14 1343 12/31/14 1354 01/01/15 0730  WBC 9.0  --  8.3  NEUTROABS 6.0  --   --   HGB 14.2 15.6 12.9*  HCT 42.9 46.0 39.6  MCV 97.7  --  98.3  PLT 154  --  149*   Cardiac Enzymes: No results for input(s): CKTOTAL, CKMB, CKMBINDEX, TROPONINI in the last 168 hours. BNP: No results for input(s): PROBNP in the last 168 hours. D-Dimer: No results for input(s): DDIMER in the last 168 hours. CBG:  Recent Labs Lab 12/31/14 2019 12/31/14 2337 01/01/15 0417  GLUCAP 109* 106* 100*   Hemoglobin A1C: No results for input(s): HGBA1C in the last 168 hours. Fasting Lipid Panel:  Recent Labs Lab 01/01/15 0216  CHOL 146  HDL 44  LDLCALC 87  TRIG 75  CHOLHDL 3.3   Thyroid Function Tests: No results for input(s): TSH, T4TOTAL, FREET4, T3FREE, THYROIDAB in the last 168 hours. Coagulation:  Recent Labs Lab 12/30/14 1328 12/31/14 1343 01/01/15 0730  LABPROT  --  19.6* 20.1*  INR 1.7 1.66* 1.71*   Anemia Panel: No results for input(s): VITAMINB12, FOLATE, FERRITIN, TIBC, IRON, RETICCTPCT in the last 168 hours. Urine Drug Screen: Drugs of Abuse     Component Value Date/Time   LABOPIA NONE DETECTED 12/31/2014 1500   COCAINSCRNUR NONE DETECTED  12/31/2014 1500   LABBENZ NONE DETECTED 12/31/2014 1500   AMPHETMU NONE DETECTED 12/31/2014 1500   THCU NONE DETECTED 12/31/2014 1500   LABBARB NONE DETECTED 12/31/2014 1500    Alcohol Level:  Recent Labs Lab 12/31/14 1343  ETH <5   Urinalysis:  Recent Labs Lab 12/31/14 1500  COLORURINE YELLOW  LABSPEC 1.015  PHURINE 5.5  GLUCOSEU NEGATIVE  HGBUR MODERATE*  BILIRUBINUR NEGATIVE  KETONESUR NEGATIVE  PROTEINUR NEGATIVE  UROBILINOGEN 0.2  NITRITE POSITIVE*  LEUKOCYTESUR LARGE*    Micro Results: No results found for this or any previous visit (from the past 240 hour(s)). Studies/Results: Ct Head Wo Contrast  12/31/2014  CLINICAL DATA:   Code stroke. Right-sided weakness. Facial droop and slurred speech. EXAM: CT HEAD WITHOUT CONTRAST TECHNIQUE: Contiguous axial images were obtained from the base of the skull through the vertex without intravenous contrast. COMPARISON:  MRI 04/19/2014, CT head 04/19/2014 FINDINGS: Negative for acute hemorrhage.  No acute infarct Chronic infarct throughout most of the right cerebellum. Chronic infarct right pons. Mild chronic ischemia left cerebellum. Chronic ischemic in the cerebral white matter right greater than left. Ventricle size normal. Cerebral volume normal for age. Negative for mass lesion. No shift of the midline structures Calvarium intact. Atherosclerotic calcification in the vertebral arteries bilaterally right greater than left. Atherosclerotic calcification in the carotid arteries bilaterally. IMPRESSION: Chronic ischemic changes are stable from the prior study. No acute abnormality. Critical Value/emergent results were called by telephone at the time of interpretation on 12/31/2014 at 2:08 pm to Dr. Armida Sans , who verbally acknowledged these results. Electronically Signed   By: Franchot Gallo M.D.   On: 12/31/2014 14:09   [name] Head Wo Contrast  12/31/2014  CLINICAL DATA:  Stroke. Atrial fibrillation on Coumadin. Received tPA. Right facial droop with dysarthria. EXAM: MRI HEAD WITHOUT CONTRAST MRA HEAD WITHOUT CONTRAST TECHNIQUE: Multiplanar, multiecho pulse sequences of the brain and surrounding structures were obtained without intravenous contrast. Angiographic images of the head were obtained using MRA technique without contrast. COMPARISON:  CT 12/31/2014.  MRI 04/19/2014 FINDINGS: MRI HEAD FINDINGS Negative for acute infarct. Chronic infarct throughout much of the right cerebellar hemisphere involving inferior and superior cerebellum. Chronic blood products in the right cerebellum unchanged from the prior study. There is atrophy of the brachium pontis on the right. Hyperintensity in the right  pons compatible with chronic infarct. Small area of chronic infarct in the left cerebellum. Mild chronic microvascular ischemic change in the white matter. Negative for hydrocephalus. Negative for mass or edema.  No shift of the midline structures. Abnormal signal right vertebral artery compatible chronic occlusion. This is unchanged. Mild mucosal edema paranasal sinuses.  Pituitary not enlarged. MRA HEAD FINDINGS Chronic occlusion right vertebral artery unchanged. Left vertebral artery appears normal. Left PICA patent. Basilar widely patent. Superior cerebellar and posterior cerebral arteries widely patent bilaterally. Internal carotid artery widely patent bilaterally. Anterior middle cerebral arteries appear normal. Negative for aneurysm. IMPRESSION: Negative for acute infarct. Chronic ischemic changes are stable since prior imaging studies. Chronic occlusion right vertebral artery is unchanged. Otherwise no significant intracranial stenosis. Electronically Signed   By: Franchot Gallo M.D.   On: 12/31/2014 20:22   Mr Brain Wo Contrast  12/31/2014  CLINICAL DATA:  Stroke. Atrial fibrillation on Coumadin. Received tPA. Right facial droop with dysarthria. EXAM: MRI HEAD WITHOUT CONTRAST MRA HEAD WITHOUT CONTRAST TECHNIQUE: Multiplanar, multiecho pulse sequences of the brain and surrounding structures were obtained without intravenous contrast. Angiographic images of the head were obtained  using MRA technique without contrast. COMPARISON:  CT 12/31/2014.  MRI 04/19/2014 FINDINGS: MRI HEAD FINDINGS Negative for acute infarct. Chronic infarct throughout much of the right cerebellar hemisphere involving inferior and superior cerebellum. Chronic blood products in the right cerebellum unchanged from the prior study. There is atrophy of the brachium pontis on the right. Hyperintensity in the right pons compatible with chronic infarct. Small area of chronic infarct in the left cerebellum. Mild chronic microvascular  ischemic change in the white matter. Negative for hydrocephalus. Negative for mass or edema.  No shift of the midline structures. Abnormal signal right vertebral artery compatible chronic occlusion. This is unchanged. Mild mucosal edema paranasal sinuses.  Pituitary not enlarged. MRA HEAD FINDINGS Chronic occlusion right vertebral artery unchanged. Left vertebral artery appears normal. Left PICA patent. Basilar widely patent. Superior cerebellar and posterior cerebral arteries widely patent bilaterally. Internal carotid artery widely patent bilaterally. Anterior middle cerebral arteries appear normal. Negative for aneurysm. IMPRESSION: Negative for acute infarct. Chronic ischemic changes are stable since prior imaging studies. Chronic occlusion right vertebral artery is unchanged. Otherwise no significant intracranial stenosis. Electronically Signed   By: Franchot Gallo M.D.   On: 12/31/2014 20:22   Medications: I have reviewed the patient's current medications. Scheduled Meds: . acetaminophen  1,000 mg Oral BID  . atorvastatin  10 mg Oral Daily  . B-complex with vitamin C  1 tablet Oral Daily  . docusate sodium  100 mg Oral BID  . enzalutamide  40 mg Oral Daily  . ezetimibe  10 mg Oral Daily  . famotidine  10 mg Oral QHS  . multivitamin with minerals  1 tablet Oral Daily  . warfarin  7.5 mg Oral ONCE-1800  . Warfarin - Pharmacist Dosing Inpatient   Does not apply q1800   Continuous Infusions: . heparin 850 Units/hr (12/31/14 2312)   PRN Meds:.acetaminophen, senna-docusate Assessment/Plan: Transient ischemic attack MRI showing no acute infarct. Most likely this is another TIA. His risk factor modification may already be maxed out s he is on statin, zetia, coumadin, treatment for prostate CA. Can potentially go home today unless significant new findings develop. -F/U TTE, carotied [place] results -atorvastatin 10mg , zetia 10mg  -Neurology recs appreciated  Chronic atrial fibrillation Given his  repeated TIA episodes now, will discuss anticoagulation regimen with neurology prior to discharge. Has good cardiology F/U outpt. INR 1.71 today, subtherapeutic. -Continue warfarin with INR goal 1.8-2.2, dosing per pharmacy consult. Takes 5-7.5mg  daily at home. -pepcid 10mg  qhs  Prostate cancer with bone metastases. -Continue Xtandi 40mg  PO  CKD stage 3 Cr 1.3-1.4 is at his baseline. No worsening renal function suggesting active UTI or bacteremia.  Hypertension Holding most home antihypertensives for a week, or give sooner PRN for ongoing severe hypertension >180 SBP.  Chronic indwelling foley catheter Questionable fever, without other suggestive symptoms of abdominal or flank pain. Given that his dysarthria and facial weakness were improving continuously and no systemic symptoms are present, unlikely his chronic bacterial colonization has become symptomatic at this time. Not treating at this time, needs to follow up outpatient and be checked for recurrence of symptoms.  Diet: Heart healthy DVT ppx: On coumadin anticoagulation FULL CODE  Dispo: Anticipated discharge in approximately 0-1 day(s).   The patient does not know have a current PCP (No Pcp Per Patient) and does not need an [facility] hospital follow-up appointment after discharge.  The patient does have transportation limitations that hinder transportation to clinic appointments.    Collier Salina, MD 01/01/2015, 10:59 AM

## 2015-01-01 NOTE — Progress Notes (Signed)
*  PRELIMINARY RESULTS* Vascular Ultrasound Carotid Duplex (Doppler) has been completed.   Findings suggest 1-39% internal carotid artery stenosis bilaterally. Vertebral arteries are patent with antegrade flow.  01/01/2015 9:03 AM Maudry Mayhew, RVT, RDCS, RDMS

## 2015-01-01 NOTE — Evaluation (Signed)
Speech Language Pathology Evaluation Patient Details Name: Craig [name]. MRN: [id number] DOB: [birth year] Today's Date: 01/01/2015 Time: 0962-8366 SLP Time Calculation (min) (ACUTE ONLY): 34 min  Problem List:  Patient Active Problem List   Diagnosis Date Noted  . Stroke ([place])   . Stroke-like symptoms 12/31/2014  . Catheter-associated urinary tract infection ([place]) 12/31/2014  . Hemoptysis 07/12/2014  . Hypertensive urgency 07/12/2014  . Pulmonary embolism ([place])   . Cough with hemoptysis   . Pulmonary embolus ([place]) 07/11/2014  . Fever 04/22/2014  . Dysarthria 04/21/2014  . CVA (cerebral infarction) 04/19/2014  . Prostate cancer, primary, with metastasis from prostate to other site [facility]) 04/19/2014  . Chronic back pain 04/19/2014  . Expressive aphasia   . Bone metastasis ([place]) 02/12/2014  . Chronic diastolic heart failure ([place]) 02/11/2014  . Hypertensive heart disease 02/11/2014  . Essential hypertension 02/11/2014  . History of stroke 02/11/2014  . AAA (abdominal aortic aneurysm) ([place]) 02/11/2014  . Hypokalemia 02/11/2014  . Back pain 02/09/2014  . Fluid overload 02/08/2014  . Left-sided weakness 02/08/2014  . Encounter for therapeutic drug monitoring 05/14/2013  . Chronic kidney disease (CKD), stage III (moderate) 09/27/2012  . Elevated brain natriuretic peptide (BNP) level 09/27/2012  . Hypercalcemia 09/27/2012  . Weakness 09/27/2012  . Epistaxis 01/08/2011  . Cerebellar stroke syndrome 07/27/2010  . Hypercholesterolemia 07/27/2010  . Prostate cancer metastatic to bone ([place]) 07/27/2010  . Benign hypertensive heart disease without heart failure 07/27/2010  . Renal insufficiency 07/27/2010  . Chronic atrial fibrillation ([place]) 05/25/2010   Past Medical History:  Past Medical History  Diagnosis Date  . Hyperlipidemia   . Hypokalemia   . Hypertensive heart disease   . Chronic back pain   . History of epistaxis 07/19/2002  . Personal history of long-term  (current) use of anticoagulants   . ICH (intracerebral hemorrhage) ([place]) ~ 1999    after TPA/notes 09/27/2012  . Hypertension   . Atrial fibrillation ([place])   . PAT (paroxysmal atrial tachycardia) ([place])   . Pneumonia     "once; several years ago" (09/27/2012)  . GERD (gastroesophageal reflux disease)   . H/O hiatal hernia   . Migraines     "migraines without headaches years ago" (09/27/2012)  . Stroke [facility]) ~ 1999    ischemic / right cerebellar/posterior inferior cerebellar artery / right pos infarct  . Melanoma ([place])     "top of my head" (09/27/2012)  . Prostate cancer, primary, with metastasis from prostate to other site [facility])     on Lupron injections per GU  . Osteoarthritis of both feet   . S/P radiation therapy 02/13/14-02/27/14    SRS 5/5 spine completed 02/27/14  . S/P radiation therapy 05/27/14-05/31/14    right femur 20Gy/85fx   Past Surgical History:  Past Surgical History  Procedure Laterality Date  . Nasal sinus surgery  1998  . Cataract extraction w/ intraocular lens  implant, bilateral Bilateral ~ 2011  . Skin graft Right 2010  . Melanoma excision  2010    "pre-melanoma on top of head; did skin graft from left thigh to cover" (09/27/2012)  . 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 MRI negative for acute infarct.    Assessment /  Plan / Recommendation Clinical Impression  Pt has a mild dysarthria that has improved but not returned to baseline since admission. He also has noteable difficulty with recall of new information, with decreased orientation to situation and need for Max A for recall of why he was admitted just a few minutes after it was discussed. This is also a  change from previous SLE earlier this year. Recommned f/u [facility] SLP services to maximize functional cognition/communication and safety.    SLP Assessment  All further Speech Lanaguage Pathology  needs can be addressed in the next venue of care    Follow Up Recommendations  Home health SLP;24 hour supervision/assistance    Pertinent Vitals/Pain Pain Assessment: No/denies pain   SLP Goals  Patient/Family Stated Goal: to eat/drink  SLP Evaluation Prior Functioning  Cognitive/Linguistic Baseline: Baseline deficits Baseline deficit details: mild memory deficits per previous SLE earlier this year Type of Home: House  Lives With: Spouse Available Help at Discharge: Family;Available 24 hours/day Vocation: Retired   Associate Professor  Overall Cognitive Status: Impaired/Different from baseline Arousal/Alertness: Awake/alert Orientation Level: Oriented X4 Attention: Sustained Sustained Attention: Appears intact Memory: Impaired Memory Impairment: Decreased recall of new information Awareness: Impaired Awareness Impairment: Intellectual impairment Problem Solving: Appears intact Safety/Judgment: Impaired    Comprehension  Auditory Comprehension Overall Auditory Comprehension: Appears within functional limits for tasks assessed    Expression Expression Primary Mode of Expression: Verbal Verbal Expression Overall Verbal Expression: Appears within functional limits for tasks assessed   Oral / Motor Oral Motor/Sensory Function Overall Oral Motor/Sensory Function: Impaired at baseline Motor Speech Overall Motor Speech: Impaired Respiration: Within functional limits Phonation: Normal Resonance: Within functional limits Articulation: Impaired Level of Impairment: Conversation Intelligibility: Intelligibility reduced Conversation: 75-100% accurate Motor Planning: Witnin functional limits Motor Speech Errors: Not applicable   GO Functional Assessment Tool Used: skilled clinical  judgment Functional Limitations: Memory Swallow Current Status (N2778): At least 1 percent but less than 20 percent impaired, limited or restricted Swallow Goal Status [phone]): At least 1 percent but less than 20 percent impaired, limited or restricted Swallow Discharge Status [phone]): At least 1 percent but less than 20 percent impaired, limited or restricted Memory Current Status (R1540): At least 40 percent but less than 60 percent impaired, limited or restricted Memory Goal Status (G8676): At least 40 percent but less than 60 percent impaired, limited or restricted Memory Discharge Status [phone]): At least 40 percent but less than 60 percent impaired, limited or restricted    Germain Osgood, M.A. CCC-SLP [phone]  Germain Osgood 01/01/2015, 11:03 AM

## 2015-01-01 NOTE — Progress Notes (Signed)
ANTICOAGULATION CONSULT NOTE - Follow Up Consult  Pharmacy Consult for Heparin Indication: Afib + new CVA  Allergies  Allergen Reactions  . Pravachol Other (See Comments)    Muscle weakness  . Zocor [Simvastatin - High Dose] Other (See Comments)    Muscle weakness    Patient Measurements: Weight: 211 lb 3.2 oz (95.8 kg) Heparin Dosing Weight:  95.8 kg  Vital Signs: Temp: 97 F (36.1 C) (11/09 1406) Temp Source: Oral (11/09 1406) BP: 151/58 mmHg (11/09 1406) Pulse Rate: 53 (11/09 1406)  Labs:  Recent Labs  12/30/14 1328  12/31/14 1343 12/31/14 1354 01/01/15 0730 01/01/15 1457  HGB  --   < > 14.2 15.6 12.9*  --   HCT  --   --  42.9 46.0 39.6  --   PLT  --   --  154  --  149*  --   APTT  --   --  34  --   --   --   LABPROT  --   --  19.6*  --  20.1*  --   INR 1.7  --  1.66*  --  1.71*  --   HEPARINUNFRC  --   --   --   --  0.17* 0.22*  CREATININE  --   --  1.34* 1.40*  --   --   < > = values in this interval not displayed.  Estimated Creatinine Clearance: 41.2 mL/min (by C-G formula based on Cr of 1.4).   Assessment: 81 year old male on Coumadin PTA for AFib.  Admitted with new CVA.   Anticoagulation: Coumadin for Afib, new CVA, INR on admission = 1.66 - Dose PTA = 5 mg Monday and Thursday, 2.5 mg other days (last dose 11.7) History of epistaxis (requiring transfusion, therefore no TPA, INR goal PTA = 1.8-2.2)  Hgb 14.2>12.9. Plts 149 stable.No dose on 11/8 while awaiting swallow eval. INR 1.71. Heparin level 0.17>0.22 still slightly less than goal. Drip rate increase not documented until 1200.  Goal of Therapy:  Heparin level 0.3-0.5  INR 1.8-2.2 Monitor platelets by anticoagulation protocol: Yes   Plan:  Increase heparin to 1100 units/hr and recheck level this PM   Audreyanna Butkiewicz S. Alford Highland, PharmD, BCPS Clinical Staff Pharmacist Pager [phone]  Eilene Ghazi Stillinger 01/01/2015,3:42 PM

## 2015-01-01 NOTE — Evaluation (Signed)
Clinical/Bedside Swallow Evaluation Patient Details  Name: Craig [name]. MRN: [id number] Date of Birth: Sep 11, 1924  Today's Date: 01/01/2015 Time: SLP Start Time (ACUTE ONLY): 1002 SLP Stop Time (ACUTE ONLY): 1036 SLP Time Calculation (min) (ACUTE ONLY): 34 min  Past Medical History:  Past Medical History  Diagnosis Date  . Hyperlipidemia   . Hypokalemia   . Hypertensive heart disease   . Chronic back pain   . History of epistaxis 07/19/2002  . Personal history of long-term (current) use of anticoagulants   . ICH (intracerebral hemorrhage) ([place]) ~ 1999    after TPA/notes 09/27/2012  . Hypertension   . Atrial fibrillation ([place])   . PAT (paroxysmal atrial tachycardia) ([place])   . Pneumonia     "once; several years ago" (09/27/2012)  . GERD (gastroesophageal reflux disease)   . H/O hiatal hernia   . Migraines     "migraines without headaches years ago" (09/27/2012)  . Stroke [facility]) ~ 1999    ischemic / right cerebellar/posterior inferior cerebellar artery / right pos infarct  . Melanoma ([place])     "top of my head" (09/27/2012)  . Prostate cancer, primary, with metastasis from prostate to other site [facility])     on Lupron injections per GU  . Osteoarthritis of both feet   . S/P radiation therapy 02/13/14-02/27/14    SRS 5/5 spine completed 02/27/14  . S/P radiation therapy 05/27/14-05/31/14    right femur 20Gy/33fx   Past Surgical History:  Past Surgical History  Procedure Laterality Date  . Nasal sinus surgery  1998  . Cataract extraction w/ intraocular lens  implant, bilateral Bilateral ~ 2011  . Skin graft Right 2010  . Melanoma excision  2010    "pre-melanoma on top of head; did skin graft from left thigh to cover" (09/27/2012)  . 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 MRI negative for acute infarct.    Assessment / Plan / Recommendation Clinical Impression  Pt has a h/o of mild dysphagia since CVA in 2002, for which he compensates with Mod I use of strategies. Immediate cough was noted x1 with thin liquids, with no other difficulties noted. Pt and family report that this is similar to his baseline level of function, and no acute CVA was noted on MRI. Recommend regular textures and thin liquids.     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 MRI negative for acute infarct.  Type of Study: Bedside swallow evaluation Previous Swallow Assessment:  h/o dysphagia since stroke in 2002, full report not available but with intermittent penetration/aspiration of thin liquids - pt/family say that he compensates for this at home Diet Prior to this Study: NPO Temperature Spikes Noted: Yes (100.2) Respiratory Status: Room air History of Recent Intubation: No Behavior/Cognition: Alert;Cooperative;Pleasant mood Oral Cavity - Dentition: Adequate natural  dentition/normal for age Self-Feeding Abilities: Able to feed self Patient Positioning: Upright in bed Baseline Vocal Quality: Normal Volitional Cough: Strong Volitional Swallow: Able to elicit    Oral/Motor/Sensory Function Overall Oral Motor/Sensory Function: Impaired at baseline   Ice Chips Ice chips: Not tested   Thin Liquid Thin Liquid: Impaired Presentation: Cup;Self Fed;Straw Pharyngeal  Phase Impairments: Cough - Immediate    Nectar Thick Nectar Thick Liquid: Not tested   Honey Thick Honey Thick Liquid: Not tested   Puree Puree: Within functional limits Presentation: Self Fed;Spoon   Solid   GO Functional Assessment Tool Used: skilled clinical judgment Functional Limitations: Swallowing Swallow Current Status (D9242): At least 1 percent but less than 20 percent impaired, limited or restricted Swallow Goal Status [phone]): At least 1 percent but less than 20 percent impaired, limited or restricted Swallow Discharge Status [phone]): At least 1 percent but less than 20 percent impaired, limited or restricted  Solid: Within functional limits Presentation: Self Fed        Craig [name], M.A. CCC-SLP [phone]  Craig [name] 01/01/2015,10:56 AM

## 2015-01-01 NOTE — Evaluation (Signed)
Physical Therapy Evaluation Patient Details Name: Craig [name]. MRN: [id number] DOB: [birth year] Today's Date: 01/01/2015   History of Present Illness  pt is an 81 y/o male with h/o htn previous strokes with ICH s/p tPA, afib, prostate CA with bone mets, admitted with R facial weakness, droop and dysarthria.  The majority of acute changes have mostly resolved.  MRI negative for acute infarct.  Clinical Impression  Pt admitted with/for s/s of stoke.  MRI negative for stroke.  Pt currently limited functionally due to the problems listed below.  (see problems list.)  Pt will benefit from PT to maximize function and safety to be able to get home safely with available assist of family.     Follow Up Recommendations Home health PT    Equipment Recommendations  Other (comment) (STEDY or HOYER)    Recommendations for Other Services       Precautions / Restrictions Precautions Precautions: Fall      Mobility  Bed Mobility Overal bed mobility: Needs Assistance;+2 for physical assistance Bed Mobility: Supine to Sit;Sit to Supine     Supine to sit: Mod assist;+2 for physical assistance     General bed mobility comments: significant truncal assist  Transfers Overall transfer level: Needs assistance Equipment used: Rolling walker (2 wheeled) Transfers: Sit to/from [facility] Sit to Stand: Mod assist;Max assist;+2 physical assistance Stand pivot transfers: Mod assist;+2 physical assistance       General transfer comment: significant support for balance and stability for standing and transfers  Ambulation/Gait             General Gait Details: not able  Stairs            Wheelchair Mobility    Modified Rankin (Stroke Patients Only)       Balance Overall balance assessment: Needs assistance Sitting-balance support: Single extremity supported;Bilateral upper extremity supported Sitting balance-Leahy Scale: Poor Sitting balance -  Comments: moderate list to Left with difficulty coming R into midline.  EOB working on sitting balance  ~10 min Postural control: Left lateral lean Standing balance support: Bilateral upper extremity supported Standing balance-Leahy Scale: Poor Standing balance comment: 2 standing trials at EOB attempting to attain upright stance                             Pertinent Vitals/Pain Pain Assessment: Faces Faces Pain Scale: Hurts little more Pain Location: knees Pain Descriptors / Indicators: Grimacing Pain Intervention(s): Limited activity within patient's tolerance;Monitored during session;Repositioned    Home Living Family/patient expects to be discharged to:: Private residence Living Arrangements: Spouse/significant other Available Help at Discharge: Family;Available 24 hours/day (can call in [place] if needed) Type of Home: House Home Access: Level entry     Home Layout: Two level Home Equipment: Shower seat - built in;Walker - 2 wheels;Grab bars - tub/shower;Walker - 4 wheels;Shower seat;Hand held shower head;Bedside commode;Tub bench;Hospital bed;Wheelchair - manual Additional Comments: also has Physiological scientist coming 2x/wk    Prior Function Level of Independence: Needs assistance   Gait / Transfers Assistance Needed: stand pivot with RW to w/c (pt starts with hands on RW as wife anchors it and uses gait belt to assist him up), w/c to toilet  ADL's / Homemaking Assistance Needed: Wife assists with all ADL's - per wife he is mostly dependent for bathing and dressing.   Comments: multiple falls at home recently, uses Rollator for ambulation     Hand Dominance  Dominant Hand: Right    Extremity/Trunk Assessment   Upper Extremity Assessment: Generalized weakness           Lower Extremity Assessment: Generalized weakness      Cervical / Trunk Assessment: Kyphotic  Communication   Communication: HOH  Cognition Arousal/Alertness:  Awake/alert Behavior During Therapy: WFL for tasks assessed/performed Overall Cognitive Status: Impaired/Different from baseline Area of Impairment: Attention;Following commands;Safety/judgement;Problem solving   Current Attention Level: Sustained   Following Commands: Follows one step commands with increased time     Problem Solving: Slow processing;Difficulty sequencing;Requires verbal cues;Requires tactile cues      General Comments General comments (skin integrity, edema, etc.): Wife and therapist discussed other options for transfers, hoyer, STEDY, etc.    Exercises        Assessment/Plan    PT Assessment Patient needs continued PT services  PT Diagnosis Generalized weakness   PT Problem List Decreased strength;Decreased activity tolerance;Decreased balance;Decreased mobility;Decreased knowledge of use of DME;Decreased coordination  PT Treatment Interventions DME instruction;Gait training;Functional mobility training;Therapeutic activities;Balance training;Patient/family education   PT Goals (Current goals can be found in the Care Plan section) Acute Rehab PT Goals Patient Stated Goal: wife wants some more options to help keep pt at home. PT Goal Formulation: With patient Time For Goal Achievement: 01/15/15 Potential to Achieve Goals: Good    Frequency Min 3X/week   Barriers to discharge        Co-evaluation               End of Session   Activity Tolerance: Patient tolerated treatment well;Patient limited by fatigue Patient left: in chair;with call bell/phone within reach Nurse Communication: Mobility status    Functional Assessment Tool Used: clinical judgement Functional Limitation: Mobility: Walking and moving around Mobility: Walking and Moving Around Current Status (K3838): At least 60 percent but less than 80 percent impaired, limited or restricted Mobility: Walking and Moving Around Goal Status [phone]): At least 60 percent but less than 80  percent impaired, limited or restricted    Time: 1510-1555 PT Time Calculation (min) (ACUTE ONLY): 45 min   Charges:   PT Evaluation $Initial PT Evaluation Tier I: 1 Procedure PT Treatments $Therapeutic Activity: 23-37 mins   PT G Codes:   PT G-Codes **NOT FOR INPATIENT CLASS** Functional Assessment Tool Used: clinical judgement Functional Limitation: Mobility: Walking and moving around Mobility: Walking and Moving Around Current Status (F5436): At least 60 percent but less than 80 percent impaired, limited or restricted Mobility: Walking and Moving Around Goal Status [phone]): At least 60 percent but less than 80 percent impaired, limited or restricted    Jillianne Gamino, Tessie Fass 01/01/2015, 5:29 PM 01/01/2015  Donnella Sham, PT [phone]  (pager)

## 2015-01-01 NOTE — Progress Notes (Signed)
RN rec'd d/c instructions for home health slp. Per Vida Roller, CM, she will call patient tomorrow to arrange.

## 2015-01-01 NOTE — Discharge Instructions (Addendum)
Your MRI showed no acute changes, and was consistent with a transient ischemic attack.  We do not think a symptomatic bacterial infection from a UTI was an factor in these symptoms. If new fevers, chills, abdominal, or flank pain start in the next days or weeks after returning home please call your Urologist for advice or call EMS to be seen at the Emergency Department if you cannot get in touch for some reason.  Take 5mg  coumadin for Wednesday and Thursday night doses this week to compensate for a missed dose on Tuesday. Then resume your previous regimen.  Please call your clinic to arrange a check up as soon as possible after leaving the hospital, within 2-3 weeks if possible.  STROKE/TIA DISCHARGE INSTRUCTIONS SMOKING Cigarette smoking nearly doubles your risk of having a stroke & is the single most alterable risk factor  If you smoke or have smoked in the last 12 months, you are advised to quit smoking for your health.  Most of the excess cardiovascular risk related to smoking disappears within a year of stopping.  Ask you doctor about anti-smoking medications  [place] Quit Line: 1-800-QUIT NOW  Free Smoking Cessation Classes [phone]  CHOLESTEROL Know your levels; limit fat & cholesterol in your diet  Lipid Panel     Component Value Date/Time   CHOL 146 01/01/2015 0216   TRIG 75 01/01/2015 0216   HDL 44 01/01/2015 0216   CHOLHDL 3.3 01/01/2015 0216   VLDL 15 01/01/2015 0216   LDLCALC 87 01/01/2015 0216      Many patients benefit from treatment even if their cholesterol is at goal.  Goal: Total Cholesterol (CHOL) less than 160  Goal:  Triglycerides (TRIG) less than 150  Goal:  HDL greater than 40  Goal:  LDL (LDLCALC) less than 100   BLOOD PRESSURE American Stroke Association blood pressure target is less that 120/80 mm/Hg  Your discharge blood pressure is:  BP: (!) 151/58 mmHg  Monitor your blood pressure  Limit your salt and alcohol intake  Many individuals will  require more than one medication for high blood pressure  DIABETES (A1c is a blood sugar average for last 3 months) Goal HGBA1c is under 7% (HBGA1c is blood sugar average for last 3 months)  Diabetes: No known diagnosis of diabetes    Lab Results  Component Value Date   HGBA1C 5.5 04/19/2014     Your HGBA1c can be lowered with medications, healthy diet, and exercise.  Check your blood sugar as directed by your physician  Call your physician if you experience unexplained or low blood sugars.  PHYSICAL ACTIVITY/REHABILITATION Goal is 30 minutes at least 4 days per week  Activity: Increase activity slowly, Therapies: Speech Therapy: Home Health   Activity decreases your risk of heart attack and stroke and makes your heart stronger.  It helps control your weight and blood pressure; helps you relax and can improve your mood.  Participate in a regular exercise program.  Talk with your doctor about the best form of exercise for you (dancing, walking, swimming, cycling).  DIET/WEIGHT Goal is to maintain a healthy weight  Your discharge diet is: Diet Heart Room service appropriate?: Yes; Fluid consistency:: Thin liquids Your height is:    Your current weight is: Weight: 95.8 kg (211 lb 3.2 oz) Your Body Mass Index (BMI) is:     Following the type of diet specifically designed for you will help prevent another stroke.  Your goal Body Mass Index (BMI) is 19-24.  Healthy food habits can help reduce 3 risk factors for stroke:  High cholesterol, hypertension, and excess weight.  RESOURCES Stroke/Support Group:  Call [phone]   STROKE EDUCATION PROVIDED/REVIEWED AND GIVEN TO PATIENT Stroke warning signs and symptoms How to activate emergency medical system (call 911). Medications prescribed at discharge. Need for follow-up after discharge. Personal risk factors for stroke. Pneumonia vaccine given: No Flu vaccine given: No My questions have been answered, the writing is legible, and I  understand these instructions.  I will adhere to these goals & educational materials that have been provided to me after my discharge from the hospital.

## 2015-01-01 NOTE — Progress Notes (Addendum)
ANTICOAGULATION CONSULT NOTE - Follow Up Consult  Pharmacy Consult for Coumadin/Heparin Indication: Afib + new CVA  Allergies  Allergen Reactions  . Pravachol Other (See Comments)    Muscle weakness  . Zocor [Simvastatin - High Dose] Other (See Comments)    Muscle weakness    Patient Measurements: Weight: 211 lb 3.2 oz (95.8 kg) Heparin Dosing Weight:  95.8 kg  Vital Signs: Temp: 100.2 F (Craig.9 C) (11/09 0400) Temp Source: Oral (11/09 0600) BP: 178/83 mmHg (11/09 0600) Pulse Rate: 71 (11/09 0600)  Labs:  Recent Labs  12/30/14 1328  12/31/14 1343 12/31/14 1354 01/01/15 0730  HGB  --   < > 14.2 15.6 12.9*  HCT  --   --  42.9 46.0 39.6  PLT  --   --  154  --  149*  APTT  --   --  34  --   --   LABPROT  --   --  19.6*  --  20.1*  INR 1.7  --  1.66*  --  1.71*  HEPARINUNFRC  --   --   --   --  0.17*  CREATININE  --   --  1.34* 1.40*  --   < > = values in this interval not displayed.  Estimated Creatinine Clearance: 41.2 mL/min (by C-G formula based on Cr of 1.4).   Assessment: 81 year old Craig [name] on Coumadin PTA for AFib.  Admitted with new CVA.   Anticoagulation: Coumadin for Afib, new CVA, INR on admission = 1.66 - Dose PTA = 5 mg Monday and Thursday, 2.5 mg other days (last dose 11.7) History of epistaxis (requiring transfusion, therefore no TPA, INR goal PTA = 1.8-2.2) Heparin level 0.17 < goal. Hgb 14.2>12.9. Plts 149 stable.No dose on 11/8 while awaiting swallow eval. INR 1.71.   Goal of Therapy:  Heparin level 0.3-0.5  INR 1.8-2.2 Monitor platelets by anticoagulation protocol: Yes   Plan:  Coumadin 6mg  po x 1 tonight Increase heparin to 1000 units/hr and recheck level this afternoon.  Cordella Nyquist S. Alford Highland, PharmD, [facility] Clinical Staff Pharmacist Pager [phone]  Eilene Ghazi Stillinger 01/01/2015,8:56 AM

## 2015-01-01 NOTE — Progress Notes (Signed)
*  PRELIMINARY RESULTS* Echocardiogram 2D Echocardiogram has been performed.  Craig [name] 01/01/2015, 9:50 AM

## 2015-01-01 NOTE — Progress Notes (Signed)
STROKE TEAM PROGRESS NOTE   HISTORY Craig Henken. 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 He was in his normal health until about 10:30 this morning his wife found him to have mild right facial droop and difficulty with sentences (LKW 1030 12/31/2014). Patient seems to answer short questions okay but difficulty with longer sentences and difficulty with commands. EMS was called, on arrival blood pressure 200/100, glucose 122, telemetry showed A. fib rhythm. Code stroke initiated. On arrival, patient continued to have language deficits but right facial droop gradually getting better. INR 1.66, and CT head did not show ICH.  As per wife, he was just saw his cardiologist Dr. Mare Ferrari yesterday, had INR check 1.7, and his Coumadin dose was increased, and he took 7.5 mg Coumadin last night. He had a history no bleeding on Coumadin with required blood transfusion, therefore his INR goal was 1.8-2.2 by Dr. Tonia Brooms. Also, he had right cerebellar stroke in 2002, was given TPA, then developed hemorrhagic transformation which did not require any surgical intervention. Patient also had prostate cancer met9astasis to right hip, pelvis, and spine status post radiation. Patient had indwelling urine catheter.  Patient was not administered TPA.  Decision making was very complex, however decision was made to not give TPA due to time window between 3-4.5 hours and age 59, INR 1.66 with increased dose last night, nose bleeding requiring blood transfusion on Coumadin, currently INR goal 1.8 - 2.2, history of TPA related hemorrhagic transformation. Discussed with wife at bedside and she agreed. He was admitted for further evaluation and  treatment.   SUBJECTIVE (INTERVAL HISTORY) His wife and daughter are at the bedside. Pt and family felt his speech much improved. No more facial droop. INR 1.71. Still on low intensity heparin drip. 2D echo showed decreased EF to 35% this time.   OBJECTIVE Temp:  [98.2 F (36.8 C)-100.2 F (37.9 C)] 100.2 F (37.9 C) (11/09 0400) Pulse Rate:  [62-98] 71 (11/09 0600) Cardiac Rhythm:  [-] Atrial fibrillation (11/09 0809) Resp:  [13-20] 14 (11/09 0600) BP: (160-217)/(83-120) 178/83 mmHg (11/09 0600) SpO2:  [93 %-99 %] 97 % (11/09 0600) Weight:  [95.8 kg (211 lb 3.2 oz)] 95.8 kg (211 lb 3.2 oz) (11/08 1404)  CBC:   Recent Labs Lab 12/31/14 1343 12/31/14 1354 01/01/15 0730  WBC 9.0  --  8.3  NEUTROABS 6.0  --   --   HGB 14.2 15.6 12.9*  HCT 42.9 46.0 39.6  MCV 97.7  --  98.3  PLT 154  --  149*    Basic Metabolic Panel:   Recent Labs Lab 12/31/14 1343 12/31/14 1354  NA 138 141  K 4.1 4.1  CL 102 102  CO2 26  --   GLUCOSE 128* 124*  BUN 37* 40*  CREATININE 1.34* 1.40*  CALCIUM 10.2  --     Lipid Panel:     Component Value Date/Time   CHOL 146 01/01/2015 0216   TRIG 75 01/01/2015 0216   HDL 44 01/01/2015 0216   CHOLHDL 3.3 01/01/2015 0216   VLDL 15 01/01/2015 0216   LDLCALC 87 01/01/2015 0216   HgbA1c:  Lab Results  Component Value Date   HGBA1C 5.5 04/19/2014   Urine Drug Screen:     Component Value Date/Time  LABOPIA NONE DETECTED 12/31/2014 1500   COCAINSCRNUR NONE DETECTED 12/31/2014 1500   LABBENZ NONE DETECTED 12/31/2014 1500   AMPHETMU NONE DETECTED 12/31/2014 1500   THCU NONE DETECTED 12/31/2014 1500   LABBARB NONE DETECTED 12/31/2014 1500      IMAGING I have personally reviewed the radiological images below and agree with the radiology interpretations.  Ct Head Wo Contrast 12/31/2014  Chronic ischemic changes are stable from the prior study. No acute abnormality.   Mri & Mra  Brain Wo Contrast 12/31/2014   Negative for acute infarct.  Chronic ischemic changes are stable since prior imaging studies. Chronic occlusion right vertebral artery is unchanged. Otherwise no significant intracranial stenosis.   2D Echocardiogram  - Left ventricle: The cavity size was normal. Wall thickness wasincreased in a pattern of moderate LVH. Systolic function wasmoderately reduced. The estimated ejection fraction was in therange of 35% to 40%. Wall motion was normal; there were noregional wall motion abnormalities. - Aortic valve: There was mild stenosis. There was mildregurgitation. Valve area (VTI): 0.81 cm^2. Valve area (Vmax):0.9 cm^2. Valve area (Vmean): 0.88 cm^2. - Mitral valve: There was mild regurgitation. - Left atrium: The atrium was mildly dilated. - Right ventricle: Systolic function was mildly reduced. - Right atrium: The atrium was mildly dilated.  Carotid Doppler   There is 1-39% bilateral ICA stenosis. Vertebral artery flow is antegrade.     PHYSICAL EXAM  Temp:  [97 F (36.1 C)-100.2 F (37.9 C)] 97 F (36.1 C) (11/09 1406) Pulse Rate:  [53-96] 53 (11/09 1406) Resp:  [14-18] 16 (11/09 1406) BP: (151-190)/(54-95) 151/58 mmHg (11/09 1406) SpO2:  [93 %-98 %] 96 % (11/09 1406)  General - Well nourished, well developed, in no apparent distress.  Ophthalmologic - Fundi not visualized due to noncooperation.  Cardiovascular - irregularly irregular heart rate and rhythm.  Mental Status -  Level of arousal and orientation to place, time and person were intact. Language exam showed intact comprehension, expression but occasional paraphasic errors, naming 2/2, intact repetition, but dysarthria.  Cranial Nerves II - XII - II - visual field full  III, IV, VI - Extraocular movements intact. V - Facial sensation intact bilaterally. VII - facial symmetrical VIII - Hearing & vestibular intact bilaterally. X - Palate elevates symmetrically. XI - Chin turning & shoulder shrug intact bilaterally. XII - Tongue protrusion  intact.  Motor Strength - The patient's strength was normal in all extremities except right lower extremity 4+/5 proximal due to right hip pain and pronator drift was absent. Bulk was normal and fasciculations were absent.  Motor Tone - Muscle tone was assessed at the neck and appendages and was normal.  Reflexes - The patient's reflexes were 1+ in all extremities and he had no pathological reflexes.  Sensory - Light touch, temperature/pinprick were assessed and were symmetrical.   Coordination - The patient had normal movements in the hands with no ataxia or dysmetria. Tremor was absent.  Gait and Station - not tested.   ASSESSMENT/PLAN [name]. 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 He did not receive IV t-PA due to time window between 3-4.5 hours and age 76, INR 1.66 with increased dose last night, nose bleeding requiring blood transfusion on  Coumadin, history of TPA related hemorrhagic transformation.   L brain TIA due to atrial fibrillation in setting of subtherapeutic INR  Resultant  Aphasia resolved  MRI  No acute infarct  MRA  Chronic R VA occlusion  Carotid Doppler  No significant stenosis   2D Echo  EF 35-40%, down from 55-60% in Feb. No source of embolus  LDL 87  HgbA1c pending  Warfarin & heparin for VTE prophylaxis Diet Heart Room service appropriate?: Yes; Fluid consistency:: Thin  warfarin daily prior to admission, now on heparin IV and warfarin daily. INR 1.77 today. Ok to bridge w/ heparin as pt without acute stroke. Recommend to stop heparin once INR 1.8.  Patient counseled to be compliant with his  antithrombotic medications  Ongoing aggressive stroke risk factor management  Therapy recommendations:  pending   Disposition:  pending   Cardiomyopathy  EF 35-40% this admission  Down from 03/2014 55-60%  Recommend to discuss with PCP Dr. Tonia Brooms for further recommendation  Atrial Fibrillation  Home anticoagulation:  coumadin continued in the hospital along with IV heparin bridge  INR 1.66 on admission  INR 1.71 this am  Continue warfarin at discharge  INR goal 1.8-2.2    Hypertensive Urgency  BP up to 217/120 on admission  Consistently lower today at 151/58  gradually normalized within 5-7 days.  Hyperlipidemia  Home meds:  lipitor 10, resumed in hospital  LDL 87, goal < 70  Continue statin at discharge  Other Stroke Risk Factors  Advanced age  Former Cigarette smoker, quit smoking 61 years ago  ETOH use  Hx stroke/TIA - 1999 ischemic / right cerebellar/posterior inferior cerebellar artery / right pos infarct  Migraines  Hospital day # 1  Neurology will sign off. Please call with questions. No neuro follow up needed at this time. Thanks for the consult.  Rosalin Hawking, MD PhD Stroke Neurology 01/01/2015 5:51 PM    To contact Stroke Continuity provider, please refer to [email or web address] After hours, contact General Neurology

## 2015-01-02 LAB — HEMOGLOBIN A1C
Hgb A1c MFr Bld: 5.8 % — ABNORMAL HIGH (ref 4.8–5.6)
MEAN PLASMA GLUCOSE: 120 mg/dL

## 2015-01-02 LAB — URINE CULTURE

## 2015-01-02 NOTE — Care Management Note (Signed)
Case Management Note  Patient Details  Name: Craig [name]. MRN: [id number] Date of Birth: 1924-05-22  Subjective/Objective:                    Action/Plan: 01/02/15 at 1017--Pt discharged late yesterday. Pt and family notified that CM would contact them this am to set up home health speech therapy. CM spoke with [name] this am about home health services. [name] stated she has used [facility] in the past and wants to use them again. [name] notified of the referral and she accepted. Pt has no PCP listed. [name] stated that [name]'s PCP is Dr Mare Ferrari. [name] with [facility] notified.   Expected Discharge Date:                  Expected Discharge Plan:  [name]  In-House Referral:     Discharge planning Services  CM Consult  Post Acute Care Choice:    Choice offered to:  Spouse  DME Arranged:    DME Agency:     HH Arranged:  Speech Therapy [place] Agency:  [facility]  Status of Service:  Completed, signed off  Medicare Important Message Given:    Date Medicare IM Given:    Medicare IM give by:    Date Additional Medicare IM Given:    Additional Medicare Important Message give by:     If discussed at [place] of Stay Meetings, dates discussed:    Additional Comments:  Pollie Friar, RN 01/02/2015, 10:15 AM

## 2015-01-03 ENCOUNTER — Other Ambulatory Visit: Payer: Self-pay | Admitting: Oncology

## 2015-01-03 LAB — URINE CULTURE: Culture: 80000

## 2015-01-21 ENCOUNTER — Telehealth: Payer: Self-pay | Admitting: *Deleted

## 2015-01-21 NOTE — Telephone Encounter (Signed)
Lm on patient identified Craig [name]. Per dr Alen Blew, okay to come at 4:10 on dec 1st. To be aware, the lab will close at 4:15.

## 2015-01-21 NOTE — Telephone Encounter (Signed)
Spoke with wife Craig [name]. She will have patient here on dec 1st by 4:00 pm for labs before visit with dr Alen Blew. pof to schedulers.

## 2015-01-23 ENCOUNTER — Ambulatory Visit (HOSPITAL_BASED_OUTPATIENT_CLINIC_OR_DEPARTMENT_OTHER): Payer: Medicare Other | Admitting: Oncology

## 2015-01-23 ENCOUNTER — Other Ambulatory Visit (HOSPITAL_BASED_OUTPATIENT_CLINIC_OR_DEPARTMENT_OTHER): Payer: Medicare Other

## 2015-01-23 ENCOUNTER — Telehealth: Payer: Self-pay | Admitting: Oncology

## 2015-01-23 ENCOUNTER — Other Ambulatory Visit: Payer: Self-pay | Admitting: Cardiology

## 2015-01-23 VITALS — BP 175/69 | HR 67 | Temp 97.4°F | Resp 18 | Ht 73.0 in

## 2015-01-23 DIAGNOSIS — I4891 Unspecified atrial fibrillation: Secondary | ICD-10-CM | POA: Diagnosis not present

## 2015-01-23 DIAGNOSIS — I2699 Other pulmonary embolism without acute cor pulmonale: Secondary | ICD-10-CM | POA: Diagnosis not present

## 2015-01-23 DIAGNOSIS — C61 Malignant neoplasm of prostate: Secondary | ICD-10-CM

## 2015-01-23 DIAGNOSIS — C7951 Secondary malignant neoplasm of bone: Secondary | ICD-10-CM

## 2015-01-23 DIAGNOSIS — E291 Testicular hypofunction: Secondary | ICD-10-CM

## 2015-01-23 LAB — COMPREHENSIVE METABOLIC PANEL (CC13)
ALT: 9 U/L (ref 0–55)
ANION GAP: 9 meq/L (ref 3–11)
AST: 16 U/L (ref 5–34)
Albumin: 3.5 g/dL (ref 3.5–5.0)
Alkaline Phosphatase: 65 U/L (ref 40–150)
BUN: 38.6 mg/dL — AB (ref 7.0–26.0)
CHLORIDE: 104 meq/L (ref 98–109)
CO2: 29 mEq/L (ref 22–29)
Calcium: 11 mg/dL — ABNORMAL HIGH (ref 8.4–10.4)
Creatinine: 1.5 mg/dL — ABNORMAL HIGH (ref 0.7–1.3)
EGFR: 42 mL/min/{1.73_m2} — AB (ref >90)
Glucose: 107 mg/dl (ref 70–140)
POTASSIUM: 4.4 meq/L (ref 3.5–5.1)
SODIUM: 143 meq/L (ref 136–145)
TOTAL PROTEIN: 7.4 g/dL (ref 6.4–8.3)
Total Bilirubin: 0.43 mg/dL (ref 0.20–1.20)

## 2015-01-23 LAB — CBC WITH DIFFERENTIAL/PLATELET
BASO%: 0.4 % (ref 0.0–2.0)
Basophils Absolute: 0 10*3/uL (ref 0.0–0.1)
EOS%: 4.3 % (ref 0.0–7.0)
Eosinophils Absolute: 0.3 10*3/uL (ref 0.0–0.5)
HCT: 43.5 % (ref 38.4–49.9)
HGB: 14.4 g/dL (ref 13.0–17.1)
LYMPH%: 28.3 % (ref 14.0–49.0)
MCH: 32.7 pg (ref 27.2–33.4)
MCHC: 33.1 g/dL (ref 32.0–36.0)
MCV: 98.9 fL — AB (ref 79.3–98.0)
MONO#: 0.8 10*3/uL (ref 0.1–0.9)
MONO%: 11.9 % (ref 0.0–14.0)
NEUT%: 55.1 % (ref 39.0–75.0)
NEUTROS ABS: 3.7 10*3/uL (ref 1.5–6.5)
PLATELETS: 158 10*3/uL (ref 140–400)
RBC: 4.4 10*6/uL (ref 4.20–5.82)
RDW: 14.6 % (ref 11.0–14.6)
WBC: 6.7 10*3/uL (ref 4.0–10.3)
lymph#: 1.9 10*3/uL (ref 0.9–3.3)

## 2015-01-23 NOTE — Addendum Note (Signed)
Addended by: Amelia Jo I on: 01/23/2015 05:07 PM   Modules accepted: Medications

## 2015-01-23 NOTE — Telephone Encounter (Signed)
per pof to sch pt appt-gave pt copy of avs °

## 2015-01-23 NOTE — Progress Notes (Signed)
Hematology and Oncology Follow Up Visit  Afshin Scheumann [id number] 03-08-1924 81 y.o. 01/23/2015 4:35 PM No PCP Per PatientNo ref. provider found   Principle Diagnosis: 81 year old with Castration resistant prostate cancer with metastatic disease to the bone. He was initially diagnosed in 2011 PSA of 19 and presented with advanced disease.   Prior Therapy: He is status post combined androgen deprivation with Lupron and Casodex with an excellent response initially and had a PSA nadir down to 1.67. Most recently he developed progression of disease and a PSA up to 5.94 in September of 2014. He is status post SRS treatment to the L4 completed on 02/13/2014. He is S/P stereotactic body radiotherapy to the iliac bone treatment completed in 02/2014. He is status post radiation therapy to the right femur completed on 06/07/2014.  Current therapy: He is on Xtandi started on 11/15/2012. He was started on 160 mg initially but the dose was reduced to 80 mg in December of 2014. Treatment has been on hold for 2 months. Xtandi resumed at 40 mg daily starting on 06/12/2014.  He is on Lupron and Xgeva done at [facility]. .  Interim History: [name] presents today for a followup visit with his wife. Since the last visit, he continues to relatively well. He is relatively weak not able to ambulate much. He is able to  Stand with assistance and transfer as well. His pain has been under reasonable control and takes only Tylenol twice a day. He has reported right leg pain when he stands for an extended period of time.  He continues to be on Xtandi of the current dose without any complications. He is been on it continuously for over 100 days and have tolerated the current dose. Has not needed any dose interruption or delay since the last visit.  Has not reported any cough or hemoptysis or hematemesis. Does not report any nausea or vomiting or abdominal pain.Is not reporting any back pain or  neurological symptoms. Does not report any headaches or blurry vision or double vision. Is not reporting any chest pain shortness of breath or difficulty breathing. Does not report any constipation or diarrhea. Does not report any hematochezia or melena. Does not report any skin rashes or lesions. Rest of his review of system is unremarkable.  Medications: I have reviewed the patient's current medications.  Current Outpatient Prescriptions  Medication Sig Dispense Refill  . acetaminophen (TYLENOL) 500 MG tablet Take 1,000 mg by mouth 2 (two) times daily. Takes two tablets in the morning and two tablets at night    . amLODipine (NORVASC) 5 MG tablet Take 1 tablet (5 mg total) by mouth daily. 90 tablet 3  . atorvastatin (LIPITOR) 10 MG tablet TAKE 1 TABLET BY MOUTH ONCE DAILY 30 tablet 6  . B Complex-C (B-COMPLEX WITH VITAMIN C) tablet Take 1 tablet by mouth daily.      . Denosumab (XGEVA ) Inject into the skin every 30 (thirty) days. Monthly. Last inj was 8-19- 2016. Wife not sure what dose it is    . docusate sodium (COLACE) 100 MG capsule Take 100 mg by mouth 2 (two) times daily.    . furosemide (LASIX) 40 MG tablet Take 1 tablet (40 mg total) by mouth daily. 90 tablet 3  . Leuprolide Acetate (LUPRON DEPOT IM) Inject 1 each into the muscle every 6 (six) months. Last inj was in March 2016    . losartan (COZAAR) 100 MG tablet TAKE 1 TABLET BY  MOUTH ONCE DAILY 30 tablet 11  . multivitamin (THERAGRAN) per tablet Take 1 tablet by mouth daily. ( with B - Complex )    . Omega-3 Fatty Acids (FISH OIL PO) Take 1 capsule by mouth daily. ( Mega Red )    . ranitidine (ZANTAC) 150 MG tablet Take 150 mg by mouth at bedtime.    . tamsulosin (FLOMAX) 0.4 MG CAPS capsule Take 1 capsule (0.4 mg total) by mouth daily. 30 capsule 2  . warfarin (COUMADIN) 5 MG tablet TAKE AS DIRECTED BY COUMADIN CLINIC (Patient taking differently: TAKE AS DIRECTED BY COUMADIN CLINIC  5mg  Monday and Thursday and 2.5mg  all other days)  30 tablet 3  . XTANDI 40 MG capsule TAKE 1 CAPSULE BY MOUTH ONCE DAILY 30 capsule 0  . ZETIA 10 MG tablet TAKE 1 TABLET BY MOUTH DAILY 30 tablet 5   No current facility-administered medications for this visit.     Allergies:  Allergies  Allergen Reactions  . Pravachol Other (See Comments)    Muscle weakness  . Zocor [Simvastatin - High Dose] Other (See Comments)    Muscle weakness    Past Medical History, Surgical history, Social history, and Family History were reviewed and updated.   Physical Exam: Blood pressure 175/69, pulse 67, temperature 97.4 F (36.3 C), temperature source Oral, resp. rate 18, height 6\' 1"  (1.854 m), SpO2 100 %. ECOG: 2 General appearance: alert, awake  Gentleman appeared in no active distress. Head: Normocephalic, without obvious abnormality no oral thrush. Neck: no adenopathy no thyromegaly. Lymph nodes: Cervical, supraclavicular, and axillary nodes normal. Heart:  Irregular rhythm without any murmurs or gallops. Lung:chest clear, no wheezing, rales, no dullness to percussion. Abdomin: soft, non-tender, without masses or organomegaly. No rebound or guarding. EXT: Trace edema much improved from previously. Neurological examination: No deficits noted.  Lab Results: Lab Results  Component Value Date   WBC 6.7 01/23/2015   HGB 14.4 01/23/2015   HCT 43.5 01/23/2015   MCV 98.9* 01/23/2015   PLT 158 01/23/2015     Chemistry      Component Value Date/Time   NA 141 12/31/2014 1354   NA 143 12/03/2014 1249   K 4.1 12/31/2014 1354   K 4.2 12/03/2014 1249   CL 102 12/31/2014 1354   CO2 26 12/31/2014 1343   CO2 27 12/03/2014 1249   BUN 40* 12/31/2014 1354   BUN 41.8* 12/03/2014 1249   CREATININE 1.40* 12/31/2014 1354   CREATININE 1.3 12/03/2014 1249      Component Value Date/Time   CALCIUM 10.2 12/31/2014 1343   CALCIUM 10.2 12/03/2014 1249   ALKPHOS 56 12/31/2014 1343   ALKPHOS 63 12/03/2014 1249   AST 21 12/31/2014 1343   AST 16  12/03/2014 1249   ALT 14* 12/31/2014 1343   ALT 11 12/03/2014 1249   BILITOT 0.8 12/31/2014 1343   BILITOT 0.55 12/03/2014 1249      Results for BOYAN, CHEEVER (MRN [id number]) as of 01/23/2015 16:00  Ref. Range 10/22/2014 14:10 12/03/2014 12:49  PSA Latest Ref Range: <=4.00 ng/mL 5.58 (H) 5.31 (H)      Impression and Plan:  81 year old gentleman with the following issues:   1. Castration resistant prostate cancer with metastatic disease to the bone. He started on xtandi in September 2014 and have required dose reduction to 40 mg since that time.   He have continued on the current dose and schedule for the last 100 days without interruption. Last PSA continues to be  under control at 5.31. have tolerated dose reduction well with PSA have been under reasonable control for the last 2 years.    The plan is to continue with the same dose and schedule given his excellent tolerance and benefit by PSA criteria. Different salvage agents will be considered he has rapid progression of his PSA.   2. Androgen depravation: He is to continue Lupron at this time. He was given by Dr. Junious Silk at [facility].  3. Bone directed therapy: continues to be on Xgeva and have tolerated it well. No major complications. This is given at [facility].  4. Hypercalcemia: His calcium level are pending from today the previously within normal range.  5. Bony metastasis: He have received radiation therapy to the lumbar spine as well as the right femur. No pain at rest but does report some pain when he stands up. I have offered him stronger pain medication he declined at this time.  6. Pulmonary embolism: He is currently anticoagulated with Coumadin.  7. Atrial fibrillation: followed by Dr. Mare Ferrari.  8. Followup: Will be in 6  weeks.  Zola Button, MD 12/1/20164:35 PM

## 2015-01-24 ENCOUNTER — Telehealth: Payer: Self-pay | Admitting: *Deleted

## 2015-01-24 ENCOUNTER — Ambulatory Visit (INDEPENDENT_AMBULATORY_CARE_PROVIDER_SITE_OTHER): Payer: Medicare Other | Admitting: Cardiovascular Disease

## 2015-01-24 DIAGNOSIS — I482 Chronic atrial fibrillation, unspecified: Secondary | ICD-10-CM

## 2015-01-24 DIAGNOSIS — Z5181 Encounter for therapeutic drug level monitoring: Secondary | ICD-10-CM

## 2015-01-24 LAB — PSA: PSA: 6.44 ng/mL — ABNORMAL HIGH (ref <=4.00)

## 2015-01-24 LAB — PROTIME-INR
INR: 1.88 — ABNORMAL HIGH (ref <1.50)
Prothrombin Time: 21.9 seconds — ABNORMAL HIGH (ref 11.6–15.2)

## 2015-01-24 NOTE — Telephone Encounter (Signed)
TC from pt's wife regarding results of PSA done 01/23/15.  Results given to wife. No other needs identified at this time.

## 2015-02-03 ENCOUNTER — Other Ambulatory Visit: Payer: Self-pay | Admitting: Oncology

## 2015-02-03 ENCOUNTER — Telehealth: Payer: Self-pay | Admitting: Cardiology

## 2015-02-03 ENCOUNTER — Telehealth: Payer: Self-pay | Admitting: Physician Assistant

## 2015-02-03 NOTE — Telephone Encounter (Signed)
Spoke with wife around 5:45 and she had just spoken with on call doctor and given patient his extra Amlodipine She checked blood pressure manually and it was 220/80 Patient still asymptomatic, does have some lower extremity edema Discussed with  Dr. Mare Ferrari and had patient take an extra Lasix 40 mg then and recheck blood pressure in 4 hours  If still elevated at that time ok to give extra 1/2 Losartan Starting tomorrow have patient take Amlodipine 10 mg daily until visit Friday Advised wife, verbalized understanding

## 2015-02-03 NOTE — Telephone Encounter (Signed)
New message      Pt c/o BP issue: STAT if pt c/o blurred vision, one-sided weakness or slurred speech  1. What are your last 5 BP readings? 190/76, 180/70, 198/88  2. Are you having any other symptoms (ex. Dizziness, headache, blurred vision, passed out)? no 3. What is your BP issue?  Pt's bp is still high----please advise

## 2015-02-03 NOTE — Telephone Encounter (Signed)
Got called from answering service, pt wife reported elevated BP of the [name]. They has called office earlier today however no reply.   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 The patient had TIA about a month ago. No symptoms of TIA or ACS. Advised to take extra 5 mg of Norvasc now and recheck BP in 2 hours, if still above 160s give losartan 50mg . The patient is asymptomatic.   Call office in morning early appointment set up. If no improvement or s/s of TIA go to ER directly. The wife and patient aware of what to look for.  Pranika Finks, [place]

## 2015-02-07 ENCOUNTER — Ambulatory Visit (INDEPENDENT_AMBULATORY_CARE_PROVIDER_SITE_OTHER): Payer: Medicare Other | Admitting: Cardiology

## 2015-02-07 ENCOUNTER — Encounter: Payer: Self-pay | Admitting: Cardiology

## 2015-02-07 VITALS — BP 160/80 | HR 62 | Ht 74.5 in | Wt 200.0 lb

## 2015-02-07 DIAGNOSIS — I119 Hypertensive heart disease without heart failure: Secondary | ICD-10-CM | POA: Diagnosis not present

## 2015-02-07 DIAGNOSIS — I482 Chronic atrial fibrillation, unspecified: Secondary | ICD-10-CM

## 2015-02-07 DIAGNOSIS — N184 Chronic kidney disease, stage 4 (severe): Secondary | ICD-10-CM | POA: Diagnosis not present

## 2015-02-07 MED ORDER — AMLODIPINE BESYLATE 10 MG PO TABS: 10.0000 mg | ORAL_TABLET | Freq: Every day | ORAL | Status: DC

## 2015-02-07 NOTE — Patient Instructions (Signed)
Medication Instructions: Your physician recommends that you continue on your current medications as directed. Please refer to the Current Medication list given to you today.  Labwork: none  Testing/Procedures: none  Follow-Up: Keep your February appointment   If you need a refill on your cardiac medications before your next appointment, please call your pharmacy.

## 2015-02-07 NOTE — Progress Notes (Signed)
Cardiology Office Note   Date:  02/07/2015   ID:  Zai Slavey., DOB 24-Sep-1924, MRN [id number]  PCP:  No PCP Per Patient  Cardiologist: [name] MD  No chief complaint on file.     History of Present Illness: Craig [name]. is a 81 y.o. male who presents for follow-up office visit.  81 y.o. male, retired [employer], with history of metastatic prostate cancer, chronic kidney disease stage III, chronic diastolic CHF, hypertension, chronic atrial fibrillation, prior strokes on Coumadin was brought to the ER on 07/11/14 after patient had coughed up blood as noticed by patient's wife. . In the ER CT angiogram shows chronic pulmonary embolism. He was treated with IV heparin and then transition back to Coumadin and was discharged on Coumadin. Coumadin was felt preferable to Lovenox because of his renal insufficiency. Initially the home health nurse checked his INR is and now he will be coming back to the [facility] Coumadin clinic. While in the hospital he was treated with aggressive diuresis and had a flareup of his gout which responded to prednisone therapy Chest x-ray showed possible community-acquired pneumonia and he was treated empirically with oral anti-biotics. Presently he is feeling well. He does not have a productive cough. There is no more hemoptysis. He is on XTandi for his metastatic prostate cancer. He has to interrupted every so often because it makes his legs weak and causes increased edema. He has been having moderately severe pain in the right upper thigh. His wife states that a new lesion has been noted in that area. He had had previous radiation to that area. The patient is on chronic atrial fibrillation. He has not been having any angina pectoris. He was hospitalized overnight from 12/31/14 until 01/01/15 because of a TIA.  This occurred when his INR was 1.7.  No changes were made in his regimen.  He has not had any further TIA  symptoms. Pressure has improved since we increased his amlodipine from 5 mg to 10 mg daily.  His chronic edema is no worse.  Past Medical History  Diagnosis Date  . Hyperlipidemia   . Hypokalemia   . Hypertensive heart disease   . Chronic back pain   . History of epistaxis 07/19/2002  . Personal history of long-term (current) use of anticoagulants   . ICH (intracerebral hemorrhage) ([place]) ~ 1999    after TPA/notes 09/27/2012  . Hypertension   . Atrial fibrillation ([place])   . PAT (paroxysmal atrial tachycardia) ([place])   . Pneumonia     "once; several years ago" (09/27/2012)  . GERD (gastroesophageal reflux disease)   . H/O hiatal hernia   . Migraines     "migraines without headaches years ago" (09/27/2012)  . Stroke [facility]) ~ 1999    ischemic / right cerebellar/posterior inferior cerebellar artery / right pos infarct  . Melanoma ([place])     "top of my head" (09/27/2012)  . Prostate cancer, primary, with metastasis from prostate to other site [facility])     on Lupron injections per GU  . Osteoarthritis of both feet   . S/P radiation therapy 02/13/14-02/27/14    SRS 5/5 spine completed 02/27/14  . S/P radiation therapy 05/27/14-05/31/14    right femur 20Gy/38fx    Past Surgical History  Procedure Laterality Date  . Nasal sinus surgery  1998  . Cataract extraction w/ intraocular lens  implant, bilateral Bilateral ~ 2011  . Skin graft Right 2010  . Melanoma excision  2010    "  pre-melanoma on top of head; did skin graft from left thigh to cover" (09/27/2012)  . Tonsillectomy  ~ 1935     Current Outpatient Prescriptions  Medication Sig Dispense Refill  . acetaminophen (TYLENOL) 500 MG tablet Take 1,000 mg by mouth 2 (two) times daily. Takes two tablets in the morning and two tablets at night    . amLODipine (NORVASC) 10 MG tablet Take 10 mg by mouth daily.    Marland Kitchen atorvastatin (LIPITOR) 10 MG tablet TAKE 1 TABLET BY MOUTH ONCE DAILY 30 tablet 6  . B Complex-C (B-COMPLEX WITH VITAMIN C) tablet Take 1 tablet  by mouth daily.      . Denosumab (XGEVA [place]) Inject into the skin every 30 (thirty) days. Monthly. Last inj was 8-19- 2016. Wife not sure what dose it is    . docusate sodium (COLACE) 100 MG capsule Take 100 mg by mouth 2 (two) times daily.    . furosemide (LASIX) 40 MG tablet Take 1 tablet (40 mg total) by mouth daily. 90 tablet 3  . Leuprolide Acetate (LUPRON DEPOT IM) Inject 1 each into the muscle every 6 (six) months. Last inj was in March 2016    . losartan (COZAAR) 100 MG tablet TAKE 1 TABLET BY MOUTH ONCE DAILY 30 tablet 11  . multivitamin (THERAGRAN) per tablet Take 1 tablet by mouth daily. ( with B - Complex )    . Omega-3 Fatty Acids (FISH OIL PO) Take 1 capsule by mouth daily. ( Mega Red )    . ranitidine (ZANTAC) 150 MG tablet Take 150 mg by mouth at bedtime.    . tamsulosin (FLOMAX) 0.4 MG CAPS capsule Take 1 capsule (0.4 mg total) by mouth daily. 30 capsule 2  . warfarin (COUMADIN) 5 MG tablet TAKE AS DIRECTED BY COUMADIN CLINIC (Patient taking differently: TAKE AS DIRECTED BY COUMADIN CLINIC  5mg  Monday and Thursday and 2.5mg  all other days) 30 tablet 3  . XTANDI 40 MG capsule TAKE 1 CAPSULE BY MOUTH ONCE DAILY 30 capsule 0  . ZETIA 10 MG tablet TAKE 1 TABLET BY MOUTH DAILY 30 tablet 5   No current facility-administered medications for this visit.    Allergies:   Pravachol and Zocor    Social History:  The patient  reports that he quit smoking about 62 years ago. His smoking use included Cigarettes. He quit after .5 years of use. He has never used smokeless tobacco. He reports that he does not drink alcohol or use illicit drugs.   Family History:  The patient's family history includes Heart attack in his father; Heart failure in his mother.    ROS:  Please see the history of present illness.   Otherwise, review of systems are positive for none.   All other systems are reviewed and negative.  He still has a indwelling Foley catheter which is changed once a month by  urology.   PHYSICAL EXAM: VS:  BP 160/80 mmHg  Pulse 62  Ht 6' 2.5" (1.892 m)  Wt 200 lb (90.719 kg)  BMI 25.34 kg/m2 , BMI Body mass index is 25.34 kg/(m^2). GEN: Well nourished, well developed, in no acute distress HEENT: normal Neck: no JVD, carotid bruits, or masses Cardiac: Slow irregular pulse.  Grade 2/6 systolic ejection murmur at the aortic area.  2+ bilateral pretibial and ankle edema. Respiratory:  clear to auscultation bilaterally, normal work of breathing GI: soft, nontender, nondistended, + BS MS: no deformity or atrophy Skin: warm and dry, no rash Neuro:  Strength and sensation are intact Psych: euthymic mood, full affect   EKG:  EKG is ordered today. The ekg ordered today demonstrates atrial fibrillation with controlled ventricular rate between 50 and 60.  No acute ischemic changes.   Recent Labs: 02/08/2014: Pro B Natriuretic peptide (BNP) 6362.0* 01/23/2015: ALT 9; BUN 38.6*; Creatinine 1.5*; HGB 14.4; Platelets 158; Potassium 4.4; Sodium 143    Lipid Panel    Component Value Date/Time   CHOL 146 01/01/2015 0216   TRIG 75 01/01/2015 0216   HDL 44 01/01/2015 0216   CHOLHDL 3.3 01/01/2015 0216   VLDL 15 01/01/2015 0216   LDLCALC 87 01/01/2015 0216      Wt Readings from Last 3 Encounters:  02/07/15 200 lb (90.719 kg)  12/31/14 211 lb 3.2 oz (95.8 kg)  12/30/14 198 lb (89.812 kg)        ASSESSMENT AND PLAN:  1. Chronic atrial fibrillation.  On long-term Coumadin. 2. Essential hypertension 3. Chronic kidney disease stage III. Has long-term Foley catheter managed by urology 4. Chronic diastolic heart failure 5. Metastatic prostate cancer, on Xtandi. Dr. Osker Mason is his oncologist  6. Recent hospitalization for hemoptysis and questionable acute on chronic pulmonary embolus. Continue warfarin 7.  Recent brief hospitalization in November 2016 for TIA at a time that his INR was 1.7 no recurrence   Current medicines are reviewed at length  with the patient today.  The patient does not have concerns regarding medicines.  The following changes have been made:  no change  Labs/ tests ordered today include:  No orders of the defined types were placed in this encounter.   Disposition: Continue current medication.  His blood pressure has improved since we increased his amlodipine.  He has an appointment to see me in late February.  After that he will see Dr. Irish Lack.    Berna Spare MD 02/07/2015 8:57 AM    [address], [place], [place]  [address] Phone: [phone]; Fax: [phone]

## 2015-02-22 ENCOUNTER — Other Ambulatory Visit: Payer: Self-pay | Admitting: Cardiology

## 2015-02-27 ENCOUNTER — Ambulatory Visit (INDEPENDENT_AMBULATORY_CARE_PROVIDER_SITE_OTHER): Payer: Medicare Other | Admitting: *Deleted

## 2015-02-27 DIAGNOSIS — I4891 Unspecified atrial fibrillation: Secondary | ICD-10-CM | POA: Diagnosis not present

## 2015-02-27 DIAGNOSIS — I482 Chronic atrial fibrillation, unspecified: Secondary | ICD-10-CM

## 2015-02-27 DIAGNOSIS — Z5181 Encounter for therapeutic drug level monitoring: Secondary | ICD-10-CM

## 2015-02-27 LAB — POCT INR: INR: 1.9

## 2015-03-05 ENCOUNTER — Other Ambulatory Visit: Payer: Self-pay | Admitting: Cardiology

## 2015-03-07 ENCOUNTER — Other Ambulatory Visit: Payer: Self-pay | Admitting: Oncology

## 2015-03-07 ENCOUNTER — Other Ambulatory Visit: Payer: Self-pay | Admitting: *Deleted

## 2015-03-11 ENCOUNTER — Telehealth: Payer: Self-pay | Admitting: Oncology

## 2015-03-11 ENCOUNTER — Ambulatory Visit (HOSPITAL_BASED_OUTPATIENT_CLINIC_OR_DEPARTMENT_OTHER): Payer: Medicare Other | Admitting: Oncology

## 2015-03-11 ENCOUNTER — Other Ambulatory Visit (HOSPITAL_BASED_OUTPATIENT_CLINIC_OR_DEPARTMENT_OTHER): Payer: Medicare Other

## 2015-03-11 VITALS — BP 166/75 | HR 70 | Temp 98.0°F | Resp 18 | Ht 74.5 in

## 2015-03-11 DIAGNOSIS — I2699 Other pulmonary embolism without acute cor pulmonale: Secondary | ICD-10-CM | POA: Diagnosis not present

## 2015-03-11 DIAGNOSIS — C61 Malignant neoplasm of prostate: Secondary | ICD-10-CM

## 2015-03-11 DIAGNOSIS — I4891 Unspecified atrial fibrillation: Secondary | ICD-10-CM

## 2015-03-11 DIAGNOSIS — C7951 Secondary malignant neoplasm of bone: Secondary | ICD-10-CM

## 2015-03-11 DIAGNOSIS — E291 Testicular hypofunction: Secondary | ICD-10-CM

## 2015-03-11 LAB — CBC WITH DIFFERENTIAL/PLATELET
BASO%: 0.6 % (ref 0.0–2.0)
BASOS ABS: 0 10*3/uL (ref 0.0–0.1)
EOS%: 5.6 % (ref 0.0–7.0)
Eosinophils Absolute: 0.5 10*3/uL (ref 0.0–0.5)
HEMATOCRIT: 41.9 % (ref 38.4–49.9)
HGB: 13.8 g/dL (ref 13.0–17.1)
LYMPH#: 1.5 10*3/uL (ref 0.9–3.3)
LYMPH%: 17.8 % (ref 14.0–49.0)
MCH: 31.9 pg (ref 27.2–33.4)
MCHC: 33 g/dL (ref 32.0–36.0)
MCV: 96.7 fL (ref 79.3–98.0)
MONO#: 0.9 10*3/uL (ref 0.1–0.9)
MONO%: 10.6 % (ref 0.0–14.0)
NEUT#: 5.7 10*3/uL (ref 1.5–6.5)
NEUT%: 65.4 % (ref 39.0–75.0)
PLATELETS: 182 10*3/uL (ref 140–400)
RBC: 4.33 10*6/uL (ref 4.20–5.82)
RDW: 14.8 % — ABNORMAL HIGH (ref 11.0–14.6)
WBC: 8.7 10*3/uL (ref 4.0–10.3)

## 2015-03-11 LAB — COMPREHENSIVE METABOLIC PANEL
ALBUMIN: 3.3 g/dL — AB (ref 3.5–5.0)
ALK PHOS: 68 U/L (ref 40–150)
ALT: 10 U/L (ref 0–55)
ANION GAP: 7 meq/L (ref 3–11)
AST: 14 U/L (ref 5–34)
BILIRUBIN TOTAL: 0.48 mg/dL (ref 0.20–1.20)
BUN: 34.2 mg/dL — ABNORMAL HIGH (ref 7.0–26.0)
CALCIUM: 10.3 mg/dL (ref 8.4–10.4)
CHLORIDE: 104 meq/L (ref 98–109)
CO2: 29 mEq/L (ref 22–29)
CREATININE: 1.5 mg/dL — AB (ref 0.7–1.3)
EGFR: 41 mL/min/{1.73_m2} — ABNORMAL LOW (ref >90)
Glucose: 105 mg/dl (ref 70–140)
Potassium: 4.7 mEq/L (ref 3.5–5.1)
Sodium: 140 mEq/L (ref 136–145)
TOTAL PROTEIN: 7.1 g/dL (ref 6.4–8.3)

## 2015-03-11 NOTE — Progress Notes (Signed)
Hematology and Oncology Follow Up Visit  Craig [name] [id number] 1924/12/14 82 y.o. 03/11/2015 12:14 PM No PCP Per PatientNo ref. provider found   Principle Diagnosis: 82 year old with Castration resistant prostate cancer with metastatic disease to the bone. He was initially diagnosed in 2011 PSA of 19 and presented with advanced disease.   Prior Therapy: He is status post combined androgen deprivation with Lupron and Casodex with an excellent response initially and had a PSA nadir down to 1.67. Most recently he developed progression of disease and a PSA up to 5.94 in September of 2014. He is status post SRS treatment to the L4 completed on 02/13/2014. He is S/P stereotactic body radiotherapy to the iliac bone treatment completed in 02/2014. He is status post radiation therapy to the right femur completed on 06/07/2014.  Current therapy: He is on Xtandi started on 11/15/2012. He was started on 160 mg initially but the dose was reduced to 80 mg in December of 2014. Treatment has been on hold for 2 months. Xtandi resumed at 40 mg daily starting on 06/12/2014.  He is on Lupron and Xgeva done at [facility]. .  Interim History: [name] presents today for a followup visit with his wife. Since the last visit, he reports no major changes in his health. His mobility is very limited at this time and predominantly confined to wheelchair. He needs assistance with transfer and his wife is the care provider for him for majority of his care. He still have a catheter in place for urination purposes.   His appetite remained reasonable although his wife noted some slight decline. His pain is controlled for the time being and has not had any changes in his quality of life.  He continues to be on Xtandi of the current dose without any complications. He has not had any interruptions as of late at least for the last 3 weeks.  Has not reported any cough or hemoptysis or hematemesis. Does not  report any nausea or vomiting or abdominal pain.Is not reporting any back pain or neurological symptoms. Does not report any headaches or blurry vision or double vision. Is not reporting any chest pain shortness of breath or difficulty breathing. Does not report any constipation or diarrhea. Does not report any hematochezia or melena. Does not report any skin rashes or lesions. Rest of his review of system is unremarkable.  Medications: I have reviewed the patient's current medications.  Current Outpatient Prescriptions  Medication Sig Dispense Refill  . acetaminophen (TYLENOL) 500 MG tablet Take 1,000 mg by mouth 2 (two) times daily. Takes two tablets in the morning and two tablets at night    . amLODipine (NORVASC) 10 MG tablet Take 1 tablet (10 mg total) by mouth daily. 90 tablet 3  . atorvastatin (LIPITOR) 10 MG tablet TAKE 1 TABLET BY MOUTH ONCE DAILY 30 tablet 6  . B Complex-C (B-COMPLEX WITH VITAMIN C) tablet Take 1 tablet by mouth daily.      . Denosumab (XGEVA [place]) Inject into the skin every 30 (thirty) days. Monthly. Last inj was 8-19- 2016. Wife not sure what dose it is    . docusate sodium (COLACE) 100 MG capsule Take 100 mg by mouth 2 (two) times daily.    . furosemide (LASIX) 40 MG tablet TAKE 1 TABLET BY MOUTH DAILY 90 tablet 2  . Leuprolide Acetate (LUPRON DEPOT IM) Inject 1 each into the muscle every 6 (six) months. Last inj was in March 2016    .  losartan (COZAAR) 100 MG tablet TAKE 1 TABLET BY MOUTH ONCE DAILY 30 tablet 11  . multivitamin (THERAGRAN) per tablet Take 1 tablet by mouth daily. ( with B - Complex )    . Omega-3 Fatty Acids (FISH OIL PO) Take 1 capsule by mouth daily. ( Mega Red )    . ranitidine (ZANTAC) 150 MG tablet Take 150 mg by mouth at bedtime.    . tamsulosin (FLOMAX) 0.4 MG CAPS capsule Take 1 capsule (0.4 mg total) by mouth daily. 30 capsule 2  . warfarin (COUMADIN) 5 MG tablet TAKE AS DIRECTED BY COUMADIN CLINIC (Patient taking differently: TAKE AS DIRECTED  BY COUMADIN CLINIC  5mg  Monday and Thursday and 2.5mg  all other days) 30 tablet 3  . XTANDI 40 MG capsule TAKE 1 CAPSULE BY MOUTH ONCE DAILY 30 capsule 0  . ZETIA 10 MG tablet TAKE 1 TABLET BY MOUTH ONCE DAILY 90 tablet 3   No current facility-administered medications for this visit.     Allergies:  Allergies  Allergen Reactions  . Pravachol Other (See Comments)    Muscle weakness  . Zocor [Simvastatin - High Dose] Other (See Comments)    Muscle weakness    Past Medical History, Surgical history, Social history, and Family History were reviewed and updated.   Physical Exam: Blood pressure 166/75, pulse 70, temperature 98 F (36.7 C), resp. rate 18, height 6' 2.5" (1.892 m), SpO2 97 %. ECOG: 2 General appearance: alert, awake elderly frail gentleman without distress. Head: Normocephalic, without obvious abnormality no oral lesions. Neck: no adenopathy or masses. Lymph nodes: Cervical, supraclavicular, and axillary nodes normal. Heart:  Irregular rhythm without any murmurs or gallops. Lung:chest clear, no wheezing, rales, no dullness to percussion. Abdomin: soft, non-tender, without masses or organomegaly. No shifting dullness or ascites. EXT: Trace edema much improved from previously. Neurological examination: No deficits noted.  Lab Results: Lab Results  Component Value Date   WBC 8.7 03/11/2015   HGB 13.8 03/11/2015   HCT 41.9 03/11/2015   MCV 96.7 03/11/2015   PLT 182 03/11/2015     Chemistry      Component Value Date/Time   NA 143 01/23/2015 1618   NA 141 12/31/2014 1354   K 4.4 01/23/2015 1618   K 4.1 12/31/2014 1354   CL 102 12/31/2014 1354   CO2 29 01/23/2015 1618   CO2 26 12/31/2014 1343   BUN 38.6* 01/23/2015 1618   BUN 40* 12/31/2014 1354   CREATININE 1.5* 01/23/2015 1618   CREATININE 1.40* 12/31/2014 1354      Component Value Date/Time   CALCIUM 11.0* 01/23/2015 1618   CALCIUM 10.2 12/31/2014 1343   ALKPHOS 65 01/23/2015 1618   ALKPHOS 56  12/31/2014 1343   AST 16 01/23/2015 1618   AST 21 12/31/2014 1343   ALT 9 01/23/2015 1618   ALT 14* 12/31/2014 1343   BILITOT 0.43 01/23/2015 1618   BILITOT 0.8 12/31/2014 1343           Impression and Plan:  82 year old gentleman with the following issues:   1. Castration resistant prostate cancer with metastatic disease to the bone. He started on xtandi in September 2014 and have required dose reduction to 40 mg since that time.   He have continued on the current dose without any major complications. His PSA remains under reasonable control. The plan is to continue the same dose and schedule given the excellent palliation this medication and have offered him.  2. Androgen depravation: He is to continue  Lupron at this time. He was given by Dr. Junious Silk at [facility]. He'll be given that in March 2017.  3. Bone directed therapy: continues to be on Xgeva and have tolerated it well. No major complications. This is given at [facility].  4. Hypercalcemia: He is no longer on calcium supplements.  5. Bony metastasis: He have received radiation therapy to the lumbar spine as well as the right femur. No pain issues reported at this time.  6. Pulmonary embolism: He is currently anticoagulated with Coumadin.  7. Atrial fibrillation: followed by Dr. Mare Ferrari.  8. Followup: Will be in 6  weeks.  Zola Button, MD 1/17/201712:14 PM

## 2015-03-11 NOTE — Telephone Encounter (Signed)
per pof to sch pt appt-gave pt copy of avs °

## 2015-03-12 ENCOUNTER — Telehealth: Payer: Self-pay | Admitting: *Deleted

## 2015-03-12 ENCOUNTER — Telehealth: Payer: Self-pay | Admitting: Oncology

## 2015-03-12 LAB — PSA (PARALLEL TESTING): PSA: 6.93 ng/mL — ABNORMAL HIGH (ref <=4.00)

## 2015-03-12 LAB — PSA: Prostate Specific Ag, Serum: 6.8 ng/mL — ABNORMAL HIGH (ref 0.0–4.0)

## 2015-03-12 NOTE — Telephone Encounter (Signed)
-----   Message from Wyatt Portela, MD sent at 03/12/2015  8:04 AM EST ----- Please let his wife know about PSA. Slightly up but I recommend no change in [place],

## 2015-03-12 NOTE — Telephone Encounter (Signed)
pt cld to r/s appt-gave pt r/s time & date °

## 2015-03-12 NOTE — Telephone Encounter (Signed)
Spoke with wife betty, PSA slightly up, but no change in xtandi dose. Faxed lab results and o.v. Note to dr eskridge, per betty's request.

## 2015-03-15 ENCOUNTER — Other Ambulatory Visit: Payer: Self-pay | Admitting: Cardiology

## 2015-03-17 ENCOUNTER — Other Ambulatory Visit: Payer: Self-pay | Admitting: Cardiology

## 2015-03-17 MED ORDER — AMLODIPINE BESYLATE 10 MG PO TABS: 10.0000 mg | ORAL_TABLET | Freq: Every day | ORAL | Status: DC

## 2015-04-04 ENCOUNTER — Other Ambulatory Visit: Payer: Self-pay | Admitting: Oncology

## 2015-04-14 ENCOUNTER — Ambulatory Visit (INDEPENDENT_AMBULATORY_CARE_PROVIDER_SITE_OTHER): Payer: Medicare Other | Admitting: Pharmacist

## 2015-04-14 ENCOUNTER — Encounter: Payer: Self-pay | Admitting: Cardiology

## 2015-04-14 ENCOUNTER — Ambulatory Visit (INDEPENDENT_AMBULATORY_CARE_PROVIDER_SITE_OTHER): Payer: Medicare Other | Admitting: Cardiology

## 2015-04-14 VITALS — BP 164/68 | HR 54 | Ht 72.0 in | Wt 200.0 lb

## 2015-04-14 DIAGNOSIS — I119 Hypertensive heart disease without heart failure: Secondary | ICD-10-CM | POA: Diagnosis not present

## 2015-04-14 DIAGNOSIS — N184 Chronic kidney disease, stage 4 (severe): Secondary | ICD-10-CM | POA: Diagnosis not present

## 2015-04-14 DIAGNOSIS — I4891 Unspecified atrial fibrillation: Secondary | ICD-10-CM

## 2015-04-14 DIAGNOSIS — I482 Chronic atrial fibrillation, unspecified: Secondary | ICD-10-CM

## 2015-04-14 DIAGNOSIS — Z5181 Encounter for therapeutic drug level monitoring: Secondary | ICD-10-CM

## 2015-04-14 LAB — POCT INR: INR: 1.9

## 2015-04-14 NOTE — Patient Instructions (Signed)
Medication Instructions:  Your physician recommends that you continue on your current medications as directed. Please refer to the Current Medication list given to you today.  Labwork: none  Testing/Procedures: none  Follow-Up: Your physician recommends that you schedule a follow-up appointment in: 3 month ov with Dr Irish Lack  If you need a refill on your cardiac medications before your next appointment, please call your pharmacy.

## 2015-04-14 NOTE — Progress Notes (Signed)
Cardiology Office Note   Date:  04/14/2015   ID:  Craig Saka., DOB 09-23-1924, MRN [id number]  PCP:  No PCP Per Patient  Cardiologist: [name] MD  No chief complaint on file.     History of Present Illness: Craig Zima. is a 82 y.o. male who presents for a scheduled 3 month follow-up office visit.  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  The patient was brought to the ER on 07/11/14 after patient had coughed up blood as noticed by patient's wife. . In the ER CT angiogram shows chronic pulmonary embolism. He was treated with IV heparin and then transition back to Coumadin and was discharged on Coumadin.  He is on XTandi for his metastatic prostate cancer. He has to interrupted every so often because it makes his legs weak and causes increased edema. He has been having moderately severe pain in the right upper thigh. His wife states that a new lesion has been noted in that area. He had had previous radiation to that area.  The patient is also on monthly Xgeva for maintenance of bone density.  The patient has a indwelling Foley catheter which is changed once a month. The patient is on chronic atrial fibrillation. He has not been having any angina pectoris. He was hospitalized overnight from 12/31/14 until 01/01/15 because of a TIA. This occurred when his INR was 1.7. No changes were made in his regimen. He has not had any further TIA symptoms. Pressure has improved since we increased his amlodipine from 5 mg to 10 mg daily. His chronic edema is no worse. He has not been experiencing any chest pain.  He has not had any increasing shortness of breath.  He does have worsening left ventricular systolic function as noted by echocardiogram on 01/01/15 with  ejection fraction of 35-40%.  Because of his comorbidities, no further aggressive evaluation of his left ventricular systolic dysfunction is planned. The patient's wife has a new device at home to help move him from bed to chair to bathroom etc.  It is called a "steady" and it has greatly simplified her caregiving of him. His INR today is 1.9.  His goal is 1.8-2.2.  Past Medical History  Diagnosis Date  . Hyperlipidemia   . Hypokalemia   . Hypertensive heart disease   . Chronic back pain   . History of epistaxis 07/19/2002  . Personal history of long-term (current) use of anticoagulants   . ICH (intracerebral hemorrhage) ([place]) ~ 1999    after TPA/notes 09/27/2012  . Hypertension   . Atrial fibrillation ([place])   . PAT (paroxysmal atrial tachycardia) ([place])   . Pneumonia     "once; several years ago" (09/27/2012)  . GERD (gastroesophageal reflux disease)   . H/O hiatal hernia   . Migraines     "migraines without headaches years ago" (09/27/2012)  . Stroke [facility]) ~ 1999    ischemic / right cerebellar/posterior inferior cerebellar artery / right pos infarct  . Melanoma ([place])     "top of my head" (09/27/2012)  . Prostate cancer, primary, with metastasis from prostate to other site [facility])     on Lupron injections per GU  . Osteoarthritis of both feet   . S/P radiation therapy 02/13/14-02/27/14    SRS 5/5 spine completed 02/27/14  .  S/P radiation therapy 05/27/14-05/31/14    right femur 20Gy/30fx    Past Surgical History  Procedure Laterality Date  . Nasal sinus surgery  1998  . Cataract extraction w/ intraocular lens  implant, bilateral Bilateral ~ 2011  . Skin graft Right 2010  . Melanoma excision  2010    "pre-melanoma on top of head; did skin graft from left thigh to cover" (09/27/2012)  . Tonsillectomy  ~ 1935     Current Outpatient Prescriptions  Medication Sig Dispense Refill  . acetaminophen (TYLENOL) 500 MG tablet Take 1,000 mg by mouth 2 (two) times daily. Takes two tablets in the  morning and two tablets at night    . amLODipine (NORVASC) 10 MG tablet Take 1 tablet (10 mg total) by mouth daily. 90 tablet 3  . atorvastatin (LIPITOR) 10 MG tablet TAKE 1 TABLET BY MOUTH ONCE DAILY 30 tablet 6  . B Complex-C (B-COMPLEX WITH VITAMIN C) tablet Take 1 tablet by mouth daily.      . Denosumab (XGEVA [place]) Inject into the skin every 30 (thirty) days. Monthly. Last inj was 8-19- 2016. Wife not sure what dose it is    . docusate sodium (COLACE) 100 MG capsule Take 100 mg by mouth 2 (two) times daily.    . furosemide (LASIX) 40 MG tablet TAKE 1 TABLET BY MOUTH DAILY 90 tablet 2  . Leuprolide Acetate (LUPRON DEPOT IM) Inject 1 each into the muscle every 6 (six) months. Last inj was in March 2016    . losartan (COZAAR) 100 MG tablet TAKE 1 TABLET BY MOUTH ONCE DAILY 30 tablet 11  . multivitamin (THERAGRAN) per tablet Take 1 tablet by mouth daily. ( with B - Complex )    . Omega-3 Fatty Acids (FISH OIL PO) Take 1 capsule by mouth daily. ( Mega Red )    . ranitidine (ZANTAC) 150 MG tablet Take 150 mg by mouth at bedtime.    . tamsulosin (FLOMAX) 0.4 MG CAPS capsule Take 1 capsule (0.4 mg total) by mouth daily. 30 capsule 2  . warfarin (COUMADIN) 5 MG tablet TAKE AS DIRECTED BY COUMADIN CLINIC (Patient taking differently: TAKE AS DIRECTED BY COUMADIN CLINIC  5mg  Monday and Thursday and 2.5mg  all other days) 30 tablet 3  . XTANDI 40 MG capsule Take 40 mg by mouth daily.  0  . ZETIA 10 MG tablet TAKE 1 TABLET BY MOUTH ONCE DAILY 90 tablet 3   No current facility-administered medications for this visit.    Allergies:   Pravachol and Zocor    Social History:  The patient  reports that he quit smoking about 62 years ago. His smoking use included Cigarettes. He quit after .5 years of use. He has never used smokeless tobacco. He reports that he does not drink alcohol or use illicit drugs.   Family History:  The patient's family history includes Heart attack in his father; Heart failure in his  mother.    ROS:  Please see the history of present illness.   Otherwise, review of systems are positive for none.   All other systems are reviewed and negative.    PHYSICAL EXAM: VS:  BP 164/68 mmHg  Pulse 54  Ht 6' (1.829 m)  Wt 200 lb (90.719 kg)  BMI 27.12 kg/m2 , BMI Body mass index is 27.12 kg/(m^2). GEN: Well nourished, well developed, in no acute distress HEENT: normal Neck: no JVD, carotid bruits, or masses Cardiac: Irregularly irregular.  Soft systolic ejection murmur at the  base.No, rubs, or gallops,no edema  Respiratory:  clear to auscultation bilaterally, normal work of breathing GI: soft, nontender, nondistended, + BS MS: no deformity or atrophy Skin: warm and dry, no rash Neuro:  Strength and sensation are intact Psych: euthymic mood, full affect   EKG:  EKG is not ordered today.    Recent Labs: 03/11/2015: ALT 10; BUN 34.2*; Creatinine 1.5*; HGB 13.8; Platelets 182; Potassium 4.7; Sodium 140    Lipid Panel    Component Value Date/Time   CHOL 146 01/01/2015 0216   TRIG 75 01/01/2015 0216   HDL 44 01/01/2015 0216   CHOLHDL 3.3 01/01/2015 0216   VLDL 15 01/01/2015 0216   LDLCALC 87 01/01/2015 0216      Wt Readings from Last 3 Encounters:  04/14/15 200 lb (90.719 kg)  02/07/15 200 lb (90.719 kg)  12/31/14 211 lb 3.2 oz (95.8 kg)        ASSESSMENT AND PLAN:  1. Chronic atrial fibrillation.  On long-term Coumadin. 2. Essential hypertension.  Overall blood pressure has improved since going to the higher dose of amlodipine 10 mg daily 3. Chronic kidney disease stage III. Has long-term Foley catheter managed by urology 4. Chronic combined systolic and diastolic heart failure with ejection fraction 35-40% by echocardiogram 01/01/15 5. Metastatic prostate cancer, on Xtandi. Dr. Osker Mason is his oncologist  6. Recent hospitalization for hemoptysis and questionable acute on chronic pulmonary embolus. Continue warfarin 7. Recent brief  hospitalization in November 2016 for TIA at a time that his INR was 1.7 no recurrence   Current medicines are reviewed at length with the patient today.  The patient does not have concerns regarding medicines.  The following changes have been made:  no change  Labs/ tests ordered today include:  No orders of the defined types were placed in this encounter.     Disposition:   Continue current medication.  Recheck for follow-up office visit with Dr. Irish Lack in 3 months.  Dr. Irish Lack will also be seeing the patient's wife.  Berna Spare MD 04/14/2015 2:34 PM    [address] [place], [place], [place]  [address] Phone: [phone]; Fax: [phone]

## 2015-04-21 ENCOUNTER — Telehealth: Payer: Self-pay | Admitting: *Deleted

## 2015-04-21 NOTE — Telephone Encounter (Signed)
"  We received a bill for 03-11-2015 PSA lab from [facility] for $10.00 we paid today.  We now today received a bill for the same lab, same day from [facility].   I spoke with Maudie Mercury in Lab who says it was sent to two different places.  I called both labs and both ran the test.  I'm told you all switched from [facility] to [facility].  We see Dr. Alen Blew Wednesday and would like to know what to do about the bill from [facility]."

## 2015-04-22 ENCOUNTER — Encounter: Payer: Self-pay | Admitting: *Deleted

## 2015-04-23 ENCOUNTER — Telehealth: Payer: Self-pay | Admitting: Oncology

## 2015-04-23 ENCOUNTER — Ambulatory Visit: Payer: Medicare Other | Admitting: Oncology

## 2015-04-23 ENCOUNTER — Other Ambulatory Visit: Payer: Medicare Other

## 2015-04-23 ENCOUNTER — Ambulatory Visit (HOSPITAL_BASED_OUTPATIENT_CLINIC_OR_DEPARTMENT_OTHER): Payer: Medicare Other | Admitting: Oncology

## 2015-04-23 ENCOUNTER — Encounter: Payer: Self-pay | Admitting: Oncology

## 2015-04-23 ENCOUNTER — Other Ambulatory Visit (HOSPITAL_BASED_OUTPATIENT_CLINIC_OR_DEPARTMENT_OTHER): Payer: Medicare Other

## 2015-04-23 VITALS — BP 165/53 | HR 56 | Temp 97.5°F | Resp 17 | Ht 72.0 in

## 2015-04-23 DIAGNOSIS — C7951 Secondary malignant neoplasm of bone: Secondary | ICD-10-CM

## 2015-04-23 DIAGNOSIS — C61 Malignant neoplasm of prostate: Secondary | ICD-10-CM | POA: Diagnosis not present

## 2015-04-23 DIAGNOSIS — I2699 Other pulmonary embolism without acute cor pulmonale: Secondary | ICD-10-CM

## 2015-04-23 DIAGNOSIS — E291 Testicular hypofunction: Secondary | ICD-10-CM

## 2015-04-23 LAB — CBC WITH DIFFERENTIAL/PLATELET
BASO%: 0.4 % (ref 0.0–2.0)
BASOS ABS: 0 10*3/uL (ref 0.0–0.1)
EOS ABS: 0.3 10*3/uL (ref 0.0–0.5)
EOS%: 4.5 % (ref 0.0–7.0)
HEMATOCRIT: 42.2 % (ref 38.4–49.9)
HEMOGLOBIN: 13.9 g/dL (ref 13.0–17.1)
LYMPH#: 1.9 10*3/uL (ref 0.9–3.3)
LYMPH%: 25.5 % (ref 14.0–49.0)
MCH: 32 pg (ref 27.2–33.4)
MCHC: 32.9 g/dL (ref 32.0–36.0)
MCV: 97.1 fL (ref 79.3–98.0)
MONO#: 0.6 10*3/uL (ref 0.1–0.9)
MONO%: 8.8 % (ref 0.0–14.0)
NEUT%: 60.8 % (ref 39.0–75.0)
NEUTROS ABS: 4.5 10*3/uL (ref 1.5–6.5)
PLATELETS: 158 10*3/uL (ref 140–400)
RBC: 4.34 10*6/uL (ref 4.20–5.82)
RDW: 14.7 % — AB (ref 11.0–14.6)
WBC: 7.4 10*3/uL (ref 4.0–10.3)

## 2015-04-23 LAB — COMPREHENSIVE METABOLIC PANEL
ALBUMIN: 3.4 g/dL — AB (ref 3.5–5.0)
ALK PHOS: 63 U/L (ref 40–150)
ALT: 10 U/L (ref 0–55)
AST: 15 U/L (ref 5–34)
Anion Gap: 9 mEq/L (ref 3–11)
BILIRUBIN TOTAL: 0.49 mg/dL (ref 0.20–1.20)
BUN: 47.3 mg/dL — AB (ref 7.0–26.0)
CO2: 26 mEq/L (ref 22–29)
Calcium: 10.2 mg/dL (ref 8.4–10.4)
Chloride: 106 mEq/L (ref 98–109)
Creatinine: 1.4 mg/dL — ABNORMAL HIGH (ref 0.7–1.3)
EGFR: 43 mL/min/{1.73_m2} — ABNORMAL LOW (ref >90)
GLUCOSE: 110 mg/dL (ref 70–140)
Potassium: 4.4 mEq/L (ref 3.5–5.1)
SODIUM: 142 meq/L (ref 136–145)
TOTAL PROTEIN: 7.1 g/dL (ref 6.4–8.3)

## 2015-04-23 NOTE — Progress Notes (Signed)
Returned pt's phone call and spoke w/ Mrs. [name].  They have received 2 lab bills, one from [facility] and one from [facility] for pt's PSA.  She thought this was a mistake.  I spoke to Staci Righter who verified that pt should not have been billed from both labs, the [place] bill is the only one he should've received so Claiborne Billings will call [facility] and have them refund the pt's payment as well as credit his ins.  I relayed this info to Mrs. [name], she verbalized understanding.

## 2015-04-23 NOTE — Telephone Encounter (Signed)
per pof to sch pt appt-gave pt copy of favs-pt req time & date

## 2015-04-23 NOTE — Progress Notes (Signed)
Hematology and Oncology Follow Up Visit  Craig [name] [id number] 1924-04-02 82 y.o. 04/23/2015 2:54 PM No PCP Per PatientNo ref. provider found   Principle Diagnosis: 82 year old with Castration resistant prostate cancer with metastatic disease to the bone. He was initially diagnosed in 2011 PSA of 19 and presented with advanced disease.   Prior Therapy: He is status post combined androgen deprivation with Lupron and Casodex with an excellent response initially and had a PSA nadir down to 1.67. Most recently he developed progression of disease and a PSA up to 5.94 in September of 2014. He is status post SRS treatment to the L4 completed on 02/13/2014. He is S/P stereotactic body radiotherapy to the iliac bone treatment completed in 02/2014. He is status post radiation therapy to the right femur completed on 06/07/2014.  Current therapy: He is on Xtandi started on 11/15/2012. He was started on 160 mg initially but the dose was reduced to 80 mg in December of 2014. Treatment has been on hold for 2 months. Xtandi resumed at 40 mg daily starting on 06/12/2014.  He is on Lupron and Xgeva done at [facility]. .  Interim History: [name] presents today for a followup visit with his wife. Since the last visit, he continues to be relatively stable without any major decline in his health. His mobility is very limited at this time and predominantly confined to wheelchair but is able to prevent with assistance. He has not reported any increase pain in his pelvic or hip bones. He has not reported any falls or fractures.  He continues to be on Xtandi of the current dose without any complications. He has not had any interruptions since August 2016.   He does not report any headaches, blurry vision, syncope or seizures. He does not report any chest pain, palpitation orthopnea. Has not reported any cough or hemoptysis or hematemesis. Does not report any nausea or vomiting or abdominal pain.  Does not report any constipation or diarrhea. Does not report any hematochezia or melena. Does not report any skin rashes or lesions. Rest of his review of system is unremarkable.  Medications: I have reviewed the patient's current medications.  Current Outpatient Prescriptions  Medication Sig Dispense Refill  . acetaminophen (TYLENOL) 500 MG tablet Take 1,000 mg by mouth 2 (two) times daily. Takes two tablets in the morning and two tablets at night    . amLODipine (NORVASC) 10 MG tablet Take 1 tablet (10 mg total) by mouth daily. 90 tablet 3  . atorvastatin (LIPITOR) 10 MG tablet TAKE 1 TABLET BY MOUTH ONCE DAILY 30 tablet 6  . B Complex-C (B-COMPLEX WITH VITAMIN C) tablet Take 1 tablet by mouth daily.      . Denosumab (XGEVA [place]) Inject into the skin every 30 (thirty) days. Monthly. Last inj was 8-19- 2016. Wife not sure what dose it is    . docusate sodium (COLACE) 100 MG capsule Take 100 mg by mouth 2 (two) times daily.    . furosemide (LASIX) 40 MG tablet TAKE 1 TABLET BY MOUTH DAILY 90 tablet 2  . Leuprolide Acetate (LUPRON DEPOT IM) Inject 1 each into the muscle every 6 (six) months. Last inj was in March 2016    . losartan (COZAAR) 100 MG tablet TAKE 1 TABLET BY MOUTH ONCE DAILY 30 tablet 11  . multivitamin (THERAGRAN) per tablet Take 1 tablet by mouth daily. ( with B - Complex )    . Omega-3 Fatty Acids (FISH OIL  PO) Take 1 capsule by mouth daily. ( Mega Red )    . ranitidine (ZANTAC) 150 MG tablet Take 150 mg by mouth at bedtime.    . tamsulosin (FLOMAX) 0.4 MG CAPS capsule Take 1 capsule (0.4 mg total) by mouth daily. 30 capsule 2  . warfarin (COUMADIN) 5 MG tablet TAKE AS DIRECTED BY COUMADIN CLINIC (Patient taking differently: TAKE AS DIRECTED BY COUMADIN CLINIC  5mg  Monday and Thursday and 2.5mg  all other days) 30 tablet 3  . XTANDI 40 MG capsule Take 40 mg by mouth daily.  0  . ZETIA 10 MG tablet TAKE 1 TABLET BY MOUTH ONCE DAILY 90 tablet 3   No current facility-administered  medications for this visit.     Allergies:  Allergies  Allergen Reactions  . Pravachol Other (See Comments)    Muscle weakness  . Zocor [Simvastatin - High Dose] Other (See Comments)    Muscle weakness    Past Medical History, Surgical history, Social history, and Family History were reviewed and updated.   Physical Exam: Blood pressure 165/53, pulse 56, temperature 97.5 F (36.4 C), temperature source Oral, resp. rate 17, height 6' (1.829 m), SpO2 99 %. ECOG: 2 General appearance: alert, awake gentleman without distress. Head: Normocephalic, without obvious abnormality no oral thrush noted. Neck: no adenopathy or masses. Lymph nodes: Cervical, supraclavicular, and axillary nodes normal. Heart:  Irregular rhythm without any murmurs or gallops. Lung:chest clear, no wheezing, rales, no dullness to percussion. Abdomin: soft, non-tender, without masses or organomegaly. No ascites. EXT: No cyanosis or edema noted. Neurological examination: No deficits noted.  Lab Results: Lab Results  Component Value Date   WBC 7.4 04/23/2015   HGB 13.9 04/23/2015   HCT 42.2 04/23/2015   MCV 97.1 04/23/2015   PLT 158 04/23/2015     Chemistry      Component Value Date/Time   NA 140 03/11/2015 1143   NA 141 12/31/2014 1354   K 4.7 03/11/2015 1143   K 4.1 12/31/2014 1354   CL 102 12/31/2014 1354   CO2 29 03/11/2015 1143   CO2 26 12/31/2014 1343   BUN 34.2* 03/11/2015 1143   BUN 40* 12/31/2014 1354   CREATININE 1.5* 03/11/2015 1143   CREATININE 1.40* 12/31/2014 1354      Component Value Date/Time   CALCIUM 10.3 03/11/2015 1143   CALCIUM 10.2 12/31/2014 1343   ALKPHOS 68 03/11/2015 1143   ALKPHOS 56 12/31/2014 1343   AST 14 03/11/2015 1143   AST 21 12/31/2014 1343   ALT 10 03/11/2015 1143   ALT 14* 12/31/2014 1343   BILITOT 0.48 03/11/2015 1143   BILITOT 0.8 12/31/2014 1343      Results for Craig [name], Craig [name] (MRN [id number]) as of 04/23/2015 14:59  Ref. Range 12/03/2014  12:49 01/23/2015 16:18 03/11/2015 11:43  PSA Latest Ref Range: <=4.00 ng/mL 5.31 (H) 6.44 (H) 6.93 (H)       Impression and Plan:  82 year old gentleman with the following issues:   1. Castration resistant prostate cancer with metastatic disease to the bone. He started on xtandi in September 2014 and have required dose reduction to 40 mg since that time.   His PSA continues to be under reasonable control and clinically have remained stable. The plan is to continue with the same dose and schedule and use a different salvage regimen upon progression.  2. Androgen depravation: He is to continue Lupron at this time. He was given by Dr. Junious Silk at [facility]. He is due to  receive this in the near future.  3. Bone directed therapy: continues to be on Xgeva and have tolerated it well. I recommended continuing to receive this at [facility] as he is doing now.  4. Hypercalcemia: His last calcium have been within normal range and we'll continue to monitor that  5. Bony metastasis: He have received radiation therapy to the lumbar spine as well as the right femur. No pain issues reported at this time.  6. Pulmonary embolism: He is currently anticoagulated with Coumadin.  7. Followup: Will be in 6  weeks.  Zola Button, MD 3/1/20172:54 PM

## 2015-04-24 ENCOUNTER — Telehealth: Payer: Self-pay | Admitting: *Deleted

## 2015-04-24 LAB — PSA (PARALLEL TESTING): PSA: 7.42 ng/mL — AB (ref <=4.00)

## 2015-04-24 LAB — PSA: Prostate Specific Ag, Serum: 7.7 ng/mL — ABNORMAL HIGH (ref 0.0–4.0)

## 2015-04-24 NOTE — Telephone Encounter (Signed)
Spoke with wife betty, gave results of last PSA. All labs done yesterday were faxed to dr eskridge, per wife's request.

## 2015-04-24 NOTE — Telephone Encounter (Signed)
-----   Message from Wyatt Portela, MD sent at 04/24/2015  9:03 AM EST ----- Please call his wife with PSA. Slightly up but I do not recommend changes.

## 2015-04-28 NOTE — Telephone Encounter (Signed)
Progress Notes   Craig [name] (MR# [id number])      Progress Notes Info    Author Note Status Last Update User Last Update Date/Time   Lenise A White Signed Lenise A White 04/23/2015 8:59 AM    Progress Notes    Expand All Collapse All   Returned pt's phone call and spoke w/ Mrs. [name]. They have received 2 lab bills, one from [facility] and one from [facility] for pt's PSA. She thought this was a mistake. I spoke to Staci Righter who verified that pt should not have been billed from both labs, the [place] bill is the only one he should've received so Claiborne Billings will call [facility] and have them refund the pt's payment as well as credit his ins. I relayed this info to Mrs. [name], she verbalized understanding.

## 2015-05-06 ENCOUNTER — Other Ambulatory Visit: Payer: Self-pay | Admitting: Oncology

## 2015-05-07 ENCOUNTER — Other Ambulatory Visit: Payer: Self-pay

## 2015-05-07 MED ORDER — ATORVASTATIN CALCIUM 10 MG PO TABS: 10.0000 mg | ORAL_TABLET | Freq: Every day | ORAL | Status: DC

## 2015-05-26 ENCOUNTER — Ambulatory Visit (INDEPENDENT_AMBULATORY_CARE_PROVIDER_SITE_OTHER): Payer: Medicare Other | Admitting: *Deleted

## 2015-05-26 DIAGNOSIS — I482 Chronic atrial fibrillation, unspecified: Secondary | ICD-10-CM

## 2015-05-26 DIAGNOSIS — I4891 Unspecified atrial fibrillation: Secondary | ICD-10-CM | POA: Diagnosis not present

## 2015-05-26 DIAGNOSIS — Z5181 Encounter for therapeutic drug level monitoring: Secondary | ICD-10-CM | POA: Diagnosis not present

## 2015-05-26 LAB — POCT INR: INR: 2

## 2015-06-02 ENCOUNTER — Other Ambulatory Visit: Payer: Self-pay | Admitting: Oncology

## 2015-06-04 ENCOUNTER — Ambulatory Visit (HOSPITAL_BASED_OUTPATIENT_CLINIC_OR_DEPARTMENT_OTHER): Payer: Medicare Other | Admitting: Oncology

## 2015-06-04 ENCOUNTER — Telehealth: Payer: Self-pay | Admitting: Oncology

## 2015-06-04 ENCOUNTER — Other Ambulatory Visit (HOSPITAL_BASED_OUTPATIENT_CLINIC_OR_DEPARTMENT_OTHER): Payer: Medicare Other

## 2015-06-04 VITALS — BP 149/66 | HR 66 | Temp 98.0°F | Resp 18

## 2015-06-04 DIAGNOSIS — C61 Malignant neoplasm of prostate: Secondary | ICD-10-CM | POA: Diagnosis not present

## 2015-06-04 DIAGNOSIS — C7951 Secondary malignant neoplasm of bone: Secondary | ICD-10-CM

## 2015-06-04 DIAGNOSIS — E291 Testicular hypofunction: Secondary | ICD-10-CM | POA: Diagnosis not present

## 2015-06-04 DIAGNOSIS — I2699 Other pulmonary embolism without acute cor pulmonale: Secondary | ICD-10-CM | POA: Diagnosis not present

## 2015-06-04 LAB — COMPREHENSIVE METABOLIC PANEL WITH GFR
ALT: <9 U/L (ref 0–55)
AST: 14 U/L (ref 5–34)
Albumin: 3.2 g/dL — ABNORMAL LOW (ref 3.5–5.0)
Alkaline Phosphatase: 57 U/L (ref 40–150)
Anion Gap: 8 meq/L (ref 3–11)
BUN: 45.9 mg/dL — ABNORMAL HIGH (ref 7.0–26.0)
CO2: 25 meq/L (ref 22–29)
Calcium: 10 mg/dL (ref 8.4–10.4)
Chloride: 107 meq/L (ref 98–109)
Creatinine: 1.5 mg/dL — ABNORMAL HIGH (ref 0.7–1.3)
EGFR: 40 ml/min/1.73 m2 — ABNORMAL LOW
Glucose: 104 mg/dL (ref 70–140)
Potassium: 4.1 meq/L (ref 3.5–5.1)
Sodium: 140 meq/L (ref 136–145)
Total Bilirubin: 0.5 mg/dL (ref 0.20–1.20)
Total Protein: 7 g/dL (ref 6.4–8.3)

## 2015-06-04 LAB — CBC WITH DIFFERENTIAL/PLATELET
BASO%: 0.8 % (ref 0.0–2.0)
Basophils Absolute: 0.1 10*3/uL (ref 0.0–0.1)
EOS%: 3.8 % (ref 0.0–7.0)
Eosinophils Absolute: 0.3 10*3/uL (ref 0.0–0.5)
HEMATOCRIT: 41.6 % (ref 38.4–49.9)
HEMOGLOBIN: 13.6 g/dL (ref 13.0–17.1)
LYMPH%: 22.3 % (ref 14.0–49.0)
MCH: 31.5 pg (ref 27.2–33.4)
MCHC: 32.7 g/dL (ref 32.0–36.0)
MCV: 96.1 fL (ref 79.3–98.0)
MONO#: 0.6 10*3/uL (ref 0.1–0.9)
MONO%: 8.9 % (ref 0.0–14.0)
NEUT#: 4.6 10*3/uL (ref 1.5–6.5)
NEUT%: 64.2 % (ref 39.0–75.0)
Platelets: 153 10*3/uL (ref 140–400)
RBC: 4.33 10*6/uL (ref 4.20–5.82)
RDW: 15 % — ABNORMAL HIGH (ref 11.0–14.6)
WBC: 7.2 10*3/uL (ref 4.0–10.3)
lymph#: 1.6 10*3/uL (ref 0.9–3.3)

## 2015-06-04 NOTE — Progress Notes (Signed)
Hematology and Oncology Follow Up Visit  Craig [name] [id number] 1924-12-21 82 y.o. 06/04/2015 2:41 PM No PCP Per PatientNo ref. provider found   Principle Diagnosis: 82 year old with Castration resistant prostate cancer with metastatic disease to the bone. He was initially diagnosed in 2011 PSA of 19 and presented with advanced disease.   Prior Therapy: He is status post combined androgen deprivation with Lupron and Casodex with an excellent response initially and had a PSA nadir down to 1.67. Most recently he developed progression of disease and a PSA up to 5.94 in September of 2014. He is status post SRS treatment to the L4 completed on 02/13/2014. He is S/P stereotactic body radiotherapy to the iliac bone treatment completed in 02/2014. He is status post radiation therapy to the right femur completed on 06/07/2014.  Current therapy: He is on Xtandi started on 11/15/2012. He was started on 160 mg initially but the dose was reduced to 80 mg in December of 2014. Treatment has been on hold for 2 months. Xtandi resumed at 40 mg daily starting on 06/12/2014.  He is on Lupron and Xgeva done at [facility]. His last Lupron given on 06/03/2015. .  Interim History: Craig [name] presents today for a followup visit with his wife. Since the last visit, he reports no major changes in his health. He reports no major decline in his health. His mobility is very limited at this time and predominantly confined to wheelchair but is able to prevent with the help of his wife who is his care provider. He has not reported any increase pain in his pelvic or hip bones. He has not reported any falls or fractures. He continues to have a Foley catheter in place and is changed monthly.  He continues to be on Xtandi of the current dose without any further decline in his energy or performance status. He denied any dyspepsia or GI symptoms. He has not had any interruptions since August 2016.   He does not  report any headaches, blurry vision, syncope or seizures. He does not report any chest pain, palpitation orthopnea. Has not reported any cough or hemoptysis or hematemesis. Does not report any nausea or vomiting or abdominal pain. Does not report any constipation or diarrhea. Does not report any hematochezia or melena. Does not report any skin rashes or lesions. Rest of his review of system is unremarkable.  Medications: I have reviewed the patient's current medications.  Current Outpatient Prescriptions  Medication Sig Dispense Refill  . acetaminophen (TYLENOL) 500 MG tablet Take 1,000 mg by mouth 2 (two) times daily. Takes two tablets in the morning and two tablets at night    . amLODipine (NORVASC) 10 MG tablet Take 1 tablet (10 mg total) by mouth daily. 90 tablet 3  . atorvastatin (LIPITOR) 10 MG tablet Take 1 tablet (10 mg total) by mouth daily. 30 tablet 6  . B Complex-C (B-COMPLEX WITH VITAMIN C) tablet Take 1 tablet by mouth daily.      . Denosumab (XGEVA [place]) Inject into the skin every 30 (thirty) days. Monthly. Last inj was 8-19- 2016. Wife not sure what dose it is    . docusate sodium (COLACE) 100 MG capsule Take 100 mg by mouth 2 (two) times daily.    . furosemide (LASIX) 40 MG tablet TAKE 1 TABLET BY MOUTH DAILY 90 tablet 2  . Leuprolide Acetate (LUPRON DEPOT IM) Inject 1 each into the muscle every 6 (six) months. Last inj was in March 2016    .  losartan (COZAAR) 100 MG tablet TAKE 1 TABLET BY MOUTH ONCE DAILY 30 tablet 11  . multivitamin (THERAGRAN) per tablet Take 1 tablet by mouth daily. ( with B - Complex )    . Omega-3 Fatty Acids (FISH OIL PO) Take 1 capsule by mouth daily. ( Mega Red )    . ranitidine (ZANTAC) 150 MG tablet Take 150 mg by mouth at bedtime.    . tamsulosin (FLOMAX) 0.4 MG CAPS capsule Take 1 capsule (0.4 mg total) by mouth daily. 30 capsule 2  . warfarin (COUMADIN) 5 MG tablet TAKE AS DIRECTED BY COUMADIN CLINIC (Patient taking differently: TAKE AS DIRECTED BY  COUMADIN CLINIC  5mg  Monday and Thursday and 2.5mg  all other days) 30 tablet 3  . XTANDI 40 MG capsule Take 40 mg by mouth daily.  0  . ZETIA 10 MG tablet TAKE 1 TABLET BY MOUTH ONCE DAILY 90 tablet 3   No current facility-administered medications for this visit.     Allergies:  Allergies  Allergen Reactions  . Pravachol Other (See Comments)    Muscle weakness  . Zocor [Simvastatin - High Dose] Other (See Comments)    Muscle weakness    Past Medical History, Surgical history, Social history, and Family History were reviewed and updated.   Physical Exam: Blood pressure 149/66, pulse 66, temperature 98 F (36.7 C), temperature source Oral, resp. rate 18, SpO2 98 %. ECOG: 2 General appearance: Awake, alert gentleman appeared without distress. Head: Normocephalic, without obvious abnormality no scleral icterus. Oral mucosa showed to be moist and pink without ulcers. Neck: no adenopathy or masses. Lymph nodes: Cervical, supraclavicular, and axillary nodes normal. Heart:  Irregular rhythm without any murmurs or gallops. Lung:chest clear, no wheezing, rales, no dullness to percussion. Abdomin: soft, non-tender, without masses or organomegaly. No shifting dullness or ascites. EXT: No cyanosis or edema noted. Neurological examination: No deficits noted.  Lab Results: Lab Results  Component Value Date   WBC 7.2 06/04/2015   HGB 13.6 06/04/2015   HCT 41.6 06/04/2015   MCV 96.1 06/04/2015   PLT 153 06/04/2015     Chemistry      Component Value Date/Time   NA 142 04/23/2015 1422   NA 141 12/31/2014 1354   K 4.4 04/23/2015 1422   K 4.1 12/31/2014 1354   CL 102 12/31/2014 1354   CO2 26 04/23/2015 1422   CO2 26 12/31/2014 1343   BUN 47.3* 04/23/2015 1422   BUN 40* 12/31/2014 1354   CREATININE 1.4* 04/23/2015 1422   CREATININE 1.40* 12/31/2014 1354      Component Value Date/Time   CALCIUM 10.2 04/23/2015 1422   CALCIUM 10.2 12/31/2014 1343   ALKPHOS 63 04/23/2015 1422    ALKPHOS 56 12/31/2014 1343   AST 15 04/23/2015 1422   AST 21 12/31/2014 1343   ALT 10 04/23/2015 1422   ALT 14* 12/31/2014 1343   BILITOT 0.49 04/23/2015 1422   BILITOT 0.8 12/31/2014 1343        Results for AARIV, DOERFLEIN (MRN [id number]) as of 06/04/2015 14:30  Ref. Range 03/11/2015 11:43 04/23/2015 14:22  PSA Latest Ref Range: <=4.00 ng/mL 6.8 (H) 7.42 (H)      Impression and Plan:  82 year old gentleman with the following issues:   1. Castration resistant prostate cancer with metastatic disease to the bone. He started on xtandi in September 2014 and have required dose reduction to 40 mg since that time.   His PSA is slightly elevated in March 2017 but  overall reasonably controlled. The plan is to continue the same dose and schedule given his clinical benefit and lack of symptomatic progression. Fabio Asa will be used as a different option if he develops symptomatic progression.  2. Androgen depravation: He is to continue Lupron at this time. He was given by Dr. Junious Silk at [facility]. He stated on 06/03/2015.  3. Bone directed therapy: continues to be on Xgeva and have tolerated it well. I recommended continuing to receive this at [facility] as he is doing now.  4. Hypercalcemia: His last calcium have been within normal range and this will be repeated today.  5. Bony metastasis: He have received radiation therapy to the lumbar spine as well as the right femur. No pain issues reported at this time.  6. Pulmonary embolism: He is currently anticoagulated with Coumadin.  7. Followup: Will be on 07/22/2015.  Zola Button, MD 4/12/20172:41 PM

## 2015-06-04 NOTE — Telephone Encounter (Signed)
per pof to sch pt appt-gave pt copy of avs °

## 2015-06-05 LAB — PSA: PROSTATE SPECIFIC AG, SERUM: 8.5 ng/mL — AB (ref 0.0–4.0)

## 2015-06-05 NOTE — Progress Notes (Signed)
Spoke with wife betty, gave results of last PSA 

## 2015-06-16 ENCOUNTER — Other Ambulatory Visit: Payer: Self-pay | Admitting: *Deleted

## 2015-06-16 MED ORDER — WARFARIN SODIUM 5 MG PO TABS: ORAL_TABLET | ORAL | Status: DC

## 2015-06-26 ENCOUNTER — Telehealth: Payer: Self-pay

## 2015-06-26 NOTE — Telephone Encounter (Signed)
Returned call to pt's wife, she states pt had Mohs surgery last week on basal call on nose.  Pt was started on Cleocin (clindamycin) last week x 5 days, had another surgery yesterday 06/25/15 and was rx another course of Cleocin.  Advised there is no interaction with Coumadin and Cleocin, no need to change Coumadin dosage or schedule sooner follow-up.  Advised to continue on same dosage of Coumadin and keep scheduled follow-up appt.  Call with any other new medications started.  Pt's wife verbalized understanding.

## 2015-07-02 ENCOUNTER — Encounter: Payer: Self-pay | Admitting: Interventional Cardiology

## 2015-07-04 ENCOUNTER — Other Ambulatory Visit: Payer: Self-pay | Admitting: Oncology

## 2015-07-07 ENCOUNTER — Telehealth: Payer: Self-pay | Admitting: Interventional Cardiology

## 2015-07-07 NOTE — Telephone Encounter (Signed)
New Message  Pt wife called states that the pt cannot walk and he has a terrible cough that isnt going away. Pt wife says that the cough doesn't bring up anything but the cough wears him out.   Requesting a call back to discuss which medications he can take given his heart condition. Please call back to discuss.

## 2015-07-07 NOTE — Telephone Encounter (Signed)
Per Dr Irish Lack the pts wife is advised that the pt may take Claritin, Mucinex and/or Robitussin without Decongestant for his cough. The pts wife states that the pt has developed a low grade fever since she called [place] the first time this am. She requesting an antibiotic. She is advised to refer to the pts PCP as Dr Irish Lack sees him for his cardiac care. She verbalized understanding and states that if the pt continues to have fever she will get him to the ER or Urgent care by ambulance because the pt does not walk and because the pt does not have a PCP at this time. The pts wife states that he will be getting a PCP soon.

## 2015-07-07 NOTE — Telephone Encounter (Addendum)
**Note De-identified Craig [name] Obfuscation** Please advise 

## 2015-07-14 NOTE — Progress Notes (Signed)
Patient ID: Craig [name]., male   DOB: 1924/12/19, 82 y.o.   MRN: [id number]     Cardiology Office Note   Date:  07/15/2015   ID:  Craig [name]., DOB May 14, 1924, MRN [id number]  PCP:  No PCP Per Patient    No chief complaint on file. f/u atrial fibrillation   Wt Readings from Last 3 Encounters:  07/15/15 200 lb (90.719 kg)  04/14/15 200 lb (90.719 kg)  02/07/15 200 lb (90.719 kg)       History of Present Illness: Craig [name]. is a 82 y.o. 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  He has seen Dr. Mare Ferrari in the past. The patient was hospitalized in 2016 after patient had coughed up blood as noticed by patient's wife. . In the ER CT angiogram shows chronic pulmonary embolism. He was treated with IV heparin and then transition back to Coumadin and was discharged on Coumadin.  Now his Coumadin is checked every 6 weeks. Range is 1.8 to 2.2.  He is on XTandi for his metastatic prostate cancer. He has to interrupted every so often because it makes his legs weak and causes increased edema.  The patient is on chronic atrial fibrillation. He has not been having any angina pectoris. He was hospitalized overnight from 12/31/14 until 01/01/15 because of a TIA. This occurred when his INR was 1.7. No changes were made in his regimen. He has not had any further TIA symptoms. Pressure has improved since we increased his amlodipine from 5 mg to 10 mg daily. His chronic edema is no worse.      Past Medical History  Diagnosis Date  . Hyperlipidemia   . Hypokalemia   . Hypertensive heart disease   . Chronic back pain   . History of epistaxis 07/19/2002  . Personal history of long-term (current) use of anticoagulants   . ICH (intracerebral hemorrhage) ([place]) ~ 1999      after TPA/notes 09/27/2012  . Hypertension   . Atrial fibrillation ([place])   . PAT (paroxysmal atrial tachycardia) ([place])   . Pneumonia     "once; several years ago" (09/27/2012)  . GERD (gastroesophageal reflux disease)   . H/O hiatal hernia   . Migraines     "migraines without headaches years ago" (09/27/2012)  . Stroke [facility]) ~ 1999    ischemic / right cerebellar/posterior inferior cerebellar artery / right pos infarct  . Melanoma ([place])     "top of my head" (09/27/2012)  . Prostate cancer, primary, with metastasis from prostate to other site [facility])     on Lupron injections per GU  . Osteoarthritis of both feet   . S/P radiation therapy 02/13/14-02/27/14    SRS 5/5 spine completed 02/27/14  . S/P radiation therapy 05/27/14-05/31/14    right femur 20Gy/50fx    Past Surgical History  Procedure Laterality Date  . Nasal sinus surgery  1998  . Cataract extraction w/ intraocular lens  implant, bilateral Bilateral ~ 2011  . Skin graft Right 2010  . Melanoma excision  2010    "pre-melanoma on top of head; did skin graft from left thigh to cover" (09/27/2012)  . Tonsillectomy  ~ 1935     Current Outpatient Prescriptions  Medication Sig Dispense Refill  . acetaminophen (TYLENOL) 500 MG tablet Take 1,000 mg by mouth 2 (two)  times daily. Takes two tablets in the morning and two tablets at night    . amLODipine (NORVASC) 10 MG tablet Take 1 tablet (10 mg total) by mouth daily. 90 tablet 3  . amoxicillin-clavulanate (AUGMENTIN) 500-125 MG tablet Take 500 mg by mouth 3 (three) times daily.     Marland Kitchen atorvastatin (LIPITOR) 10 MG tablet Take 1 tablet (10 mg total) by mouth daily. 30 tablet 6  . B Complex-C (B-COMPLEX WITH VITAMIN C) tablet Take 1 tablet by mouth daily.      . Denosumab (XGEVA [place]) Inject into the skin every 30 (thirty) days. Monthly. Last inj was 8-19- 2016. Wife not sure what dose it is    . docusate sodium (COLACE) 100 MG capsule Take 100 mg by mouth 2 (two) times daily.    . furosemide (LASIX)  40 MG tablet TAKE 1 TABLET BY MOUTH DAILY 90 tablet 2  . imiquimod (ALDARA) 5 % cream Apply 1 application topically 2 (two) times a week.     Marland Kitchen Leuprolide Acetate (LUPRON DEPOT IM) Inject 1 each into the muscle every 6 (six) months. Last inj was in March 2016    . losartan (COZAAR) 100 MG tablet TAKE 1 TABLET BY MOUTH ONCE DAILY 30 tablet 11  . multivitamin (THERAGRAN) per tablet Take 1 tablet by mouth daily. ( with B - Complex )    . Omega-3 Fatty Acids (FISH OIL PO) Take 1 capsule by mouth daily. ( Mega Red )    . ranitidine (ZANTAC) 150 MG tablet Take 150 mg by mouth at bedtime.    . tamsulosin (FLOMAX) 0.4 MG CAPS capsule Take 1 capsule (0.4 mg total) by mouth daily. 30 capsule 2  . warfarin (COUMADIN) 5 MG tablet TAKE AS DIRECTED BY COUMADIN CLINIC 30 tablet 3  . XTANDI 40 MG capsule Take 40 mg by mouth daily.  0  . ZETIA 10 MG tablet TAKE 1 TABLET BY MOUTH ONCE DAILY 90 tablet 3   No current facility-administered medications for this visit.    Allergies:   Pravachol and Zocor    Social History:  The patient  reports that he quit smoking about 62 years ago. His smoking use included Cigarettes. He quit after .5 years of use. He has never used smokeless tobacco. He reports that he does not drink alcohol or use illicit drugs.   Family History:  The patient's family history includes Heart attack in his father; Heart failure in his mother.    ROS:  Please see the history of present illness.   Otherwise, review of systems are positive for bladder spasms.   All other systems are reviewed and negative.    PHYSICAL EXAM: VS:  BP 150/70 mmHg  Pulse 60  Ht 6' (1.829 m)  Wt 200 lb (90.719 kg)  BMI 27.12 kg/m2 , BMI Body mass index is 27.12 kg/(m^2). GEN: Well nourished, well developed, in no acute distress HEENT: normal Neck: no JVD, carotid bruits, or masses Cardiac: irregularly irregular; 2/6 systolic  murmur, no rubs, or gallops,no edema  Respiratory:  clear to auscultation  bilaterally, normal work of breathing GI: soft, nontender, nondistended, + BS MS: no deformity or atrophy Skin: warm and dry, no rash Neuro:  Strength and sensation are intact Psych: euthymic mood, full affect   EKG:   The ekg ordered in 12/16 demonstrates AFib, rate controlled.   Recent Labs: 06/04/2015: ALT <9; BUN 45.9*; Creatinine 1.5*; HGB 13.6; Platelets 153; Potassium 4.1; Sodium 140   Lipid Panel  Component Value Date/Time   CHOL 146 01/01/2015 0216   TRIG 75 01/01/2015 0216   HDL 44 01/01/2015 0216   CHOLHDL 3.3 01/01/2015 0216   VLDL 15 01/01/2015 0216   LDLCALC 87 01/01/2015 0216     Other studies Reviewed: Additional studies/ records that were reviewed today with results demonstrating: Echo from 11/16: EF 35%, mild AS.Marland Kitchen   ASSESSMENT AND PLAN:  1. AFib: Chronic anticoagulation with h/o stroke. No palpitations. COntinue current rate control and stroke prevention.  2. H/o hemoptysis with pulmonary embolism. No recent bleeding.  3. HTN: adequately controlled.  4. CKD stage III: check labs today. 5. Aortic atherosclerosis.: Had a small AAA in the past per his report, but he is not interested in further monitoring at this time given his overall condition.  I think this is reasonable.  6. Mild AS: Murmur on exam.  No sx of severe AS.  7. Chronic diastolic heart failure: appears euvolemic.  I gave the wife the flexibility to give an extra 20 mg of Lasix.    Current medicines are reviewed at length with the patient today.  The patient concerns regarding his medicines were addressed.  The following changes have been made:  No change  Labs/ tests ordered today include:  No orders of the defined types were placed in this encounter.    Recommend 150 minutes/week of aerobic exercise Low fat, low carb, high fiber diet recommended  Disposition:   FU in 6 months   Signed, Larae Grooms, MD  07/15/2015 11:27 AM    [facility] [address], [place], [place]  [address] Phone: [phone]; Fax: [phone]

## 2015-07-15 ENCOUNTER — Encounter: Payer: Self-pay | Admitting: Interventional Cardiology

## 2015-07-15 ENCOUNTER — Ambulatory Visit (INDEPENDENT_AMBULATORY_CARE_PROVIDER_SITE_OTHER): Payer: Medicare Other | Admitting: Interventional Cardiology

## 2015-07-15 ENCOUNTER — Ambulatory Visit (INDEPENDENT_AMBULATORY_CARE_PROVIDER_SITE_OTHER): Payer: Medicare Other | Admitting: *Deleted

## 2015-07-15 VITALS — BP 150/70 | HR 60 | Ht 72.0 in | Wt 200.0 lb

## 2015-07-15 DIAGNOSIS — E7800 Pure hypercholesterolemia, unspecified: Secondary | ICD-10-CM | POA: Diagnosis not present

## 2015-07-15 DIAGNOSIS — I714 Abdominal aortic aneurysm, without rupture, unspecified: Secondary | ICD-10-CM

## 2015-07-15 DIAGNOSIS — Z5181 Encounter for therapeutic drug level monitoring: Secondary | ICD-10-CM

## 2015-07-15 DIAGNOSIS — I482 Chronic atrial fibrillation, unspecified: Secondary | ICD-10-CM

## 2015-07-15 DIAGNOSIS — I350 Nonrheumatic aortic (valve) stenosis: Secondary | ICD-10-CM

## 2015-07-15 DIAGNOSIS — I4891 Unspecified atrial fibrillation: Secondary | ICD-10-CM

## 2015-07-15 DIAGNOSIS — I5032 Chronic diastolic (congestive) heart failure: Secondary | ICD-10-CM

## 2015-07-15 DIAGNOSIS — N289 Disorder of kidney and ureter, unspecified: Secondary | ICD-10-CM | POA: Diagnosis not present

## 2015-07-15 LAB — COMPREHENSIVE METABOLIC PANEL
ALT: 14 U/L (ref 9–46)
AST: 18 U/L (ref 10–35)
Albumin: 3.9 g/dL (ref 3.6–5.1)
Alkaline Phosphatase: 70 U/L (ref 40–115)
BUN: 28 mg/dL — AB (ref 7–25)
CHLORIDE: 103 mmol/L (ref 98–110)
CO2: 26 mmol/L (ref 20–31)
Calcium: 10.3 mg/dL (ref 8.6–10.3)
Creat: 1.34 mg/dL — ABNORMAL HIGH (ref 0.70–1.11)
GLUCOSE: 102 mg/dL — AB (ref 65–99)
POTASSIUM: 4.6 mmol/L (ref 3.5–5.3)
SODIUM: 140 mmol/L (ref 135–146)
TOTAL PROTEIN: 7.4 g/dL (ref 6.1–8.1)
Total Bilirubin: 0.8 mg/dL (ref 0.2–1.2)

## 2015-07-15 LAB — LIPID PANEL
CHOL/HDL RATIO: 2.4 ratio (ref <=5.0)
Cholesterol: 137 mg/dL (ref 125–200)
HDL: 56 mg/dL (ref >=40)
LDL CALC: 55 mg/dL (ref <130)
TRIGLYCERIDES: 132 mg/dL (ref <150)
VLDL: 26 mg/dL (ref <30)

## 2015-07-15 LAB — POCT INR: INR: 1.8

## 2015-07-15 NOTE — Patient Instructions (Signed)
**Note De-Identified Craig [name] Obfuscation** Medication Instructions:  Same-no changes  Labwork: Lipids and a CMET today  Testing/Procedures: None  Follow-Up: Your physician wants you to follow-up in: 6 months. You will receive a reminder letter in the mail two months in advance. If you don't receive a letter, please call our office to schedule the follow-up appointment.     If you need a refill on your cardiac medications before your next appointment, please call your pharmacy.

## 2015-07-22 ENCOUNTER — Ambulatory Visit (HOSPITAL_BASED_OUTPATIENT_CLINIC_OR_DEPARTMENT_OTHER): Payer: Medicare Other | Admitting: Oncology

## 2015-07-22 ENCOUNTER — Other Ambulatory Visit (HOSPITAL_BASED_OUTPATIENT_CLINIC_OR_DEPARTMENT_OTHER): Payer: Medicare Other

## 2015-07-22 ENCOUNTER — Telehealth: Payer: Self-pay | Admitting: Oncology

## 2015-07-22 VITALS — BP 160/67 | HR 66 | Temp 97.4°F | Resp 17 | Ht 72.0 in

## 2015-07-22 DIAGNOSIS — I2699 Other pulmonary embolism without acute cor pulmonale: Secondary | ICD-10-CM

## 2015-07-22 DIAGNOSIS — E291 Testicular hypofunction: Secondary | ICD-10-CM

## 2015-07-22 DIAGNOSIS — C61 Malignant neoplasm of prostate: Secondary | ICD-10-CM

## 2015-07-22 DIAGNOSIS — C7951 Secondary malignant neoplasm of bone: Secondary | ICD-10-CM | POA: Diagnosis not present

## 2015-07-22 LAB — CBC WITH DIFFERENTIAL/PLATELET
BASO%: 0.4 % (ref 0.0–2.0)
Basophils Absolute: 0 10*3/uL (ref 0.0–0.1)
EOS%: 3.3 % (ref 0.0–7.0)
Eosinophils Absolute: 0.2 10*3/uL (ref 0.0–0.5)
HCT: 42.5 % (ref 38.4–49.9)
HGB: 13.9 g/dL (ref 13.0–17.1)
LYMPH%: 22 % (ref 14.0–49.0)
MCH: 31.4 pg (ref 27.2–33.4)
MCHC: 32.7 g/dL (ref 32.0–36.0)
MCV: 96.1 fL (ref 79.3–98.0)
MONO#: 0.7 10*3/uL (ref 0.1–0.9)
MONO%: 9.8 % (ref 0.0–14.0)
NEUT%: 64.5 % (ref 39.0–75.0)
NEUTROS ABS: 4.6 10*3/uL (ref 1.5–6.5)
Platelets: 245 10*3/uL (ref 140–400)
RBC: 4.42 10*6/uL (ref 4.20–5.82)
RDW: 14.6 % (ref 11.0–14.6)
WBC: 7.1 10*3/uL (ref 4.0–10.3)
lymph#: 1.6 10*3/uL (ref 0.9–3.3)

## 2015-07-22 LAB — COMPREHENSIVE METABOLIC PANEL
ALT: 13 U/L (ref 0–55)
AST: 16 U/L (ref 5–34)
Albumin: 3 g/dL — ABNORMAL LOW (ref 3.5–5.0)
Alkaline Phosphatase: 69 U/L (ref 40–150)
Anion Gap: 8 mEq/L (ref 3–11)
BILIRUBIN TOTAL: 0.42 mg/dL (ref 0.20–1.20)
BUN: 34.7 mg/dL — ABNORMAL HIGH (ref 7.0–26.0)
CO2: 26 meq/L (ref 22–29)
CREATININE: 1.3 mg/dL (ref 0.7–1.3)
Calcium: 10.1 mg/dL (ref 8.4–10.4)
Chloride: 106 mEq/L (ref 98–109)
EGFR: 48 mL/min/{1.73_m2} — ABNORMAL LOW (ref >90)
GLUCOSE: 122 mg/dL (ref 70–140)
Potassium: 4.3 mEq/L (ref 3.5–5.1)
SODIUM: 140 meq/L (ref 136–145)
TOTAL PROTEIN: 7.2 g/dL (ref 6.4–8.3)

## 2015-07-22 NOTE — Telephone Encounter (Signed)
Gave and printed appt sched and avs for pt for July  °

## 2015-07-22 NOTE — Progress Notes (Signed)
Hematology and Oncology Follow Up Visit  Craig [name] [id number] 02/11/1925 82 y.o. 07/22/2015 12:28 PM No PCP Per PatientNo ref. provider found   Principle Diagnosis: 82 year old with Castration resistant prostate cancer with metastatic disease to the bone. He was initially diagnosed in 2011 PSA of 19 and presented with advanced disease.   Prior Therapy: He is status post combined androgen deprivation with Lupron and Casodex with an excellent response initially and had a PSA nadir down to 1.67. Most recently he developed progression of disease and a PSA up to 5.94 in September of 2014. He is status post SRS treatment to the L4 completed on 02/13/2014. He is S/P stereotactic body radiotherapy to the iliac bone treatment completed in 02/2014. He is status post radiation therapy to the right femur completed on 06/07/2014.  Current therapy: He is on Xtandi started on 11/15/2012. He was started on 160 mg initially but the dose was reduced to 80 mg in December of 2014. Treatment has been on hold for 2 months. Xtandi resumed at 40 mg daily starting on 06/12/2014.  He is on Lupron and Xgeva done at [facility]. His last Lupron given on 06/03/2015. .  Interim History: [name] presents today for a followup visit with his wife. Since the last visit, he was diagnosed with pneumonia and was treated with oral antibiotics. He presented with symptoms of weakness and fatigue and nighttime cough. These symptoms have improved after completion of the course of antibiotic. At this point, he feels back to his baseline and his quality of life although marginal have not dramatically changed. He continues to be on Xtandi of the current dose without any further decline in his energy or performance status. He denied any dyspepsia or GI symptoms.   His mobility is very limited at this time and predominantly confined to wheelchair but is able to prevent with the help of his wife who is his care provider.  He has not reported any increase pain in his pelvic or hip bones. He has not reported any falls or fractures. He continues to have a Foley catheter in place and is changed monthly.   He does not report any headaches, blurry vision, syncope or seizures. He does not report any chest pain, palpitation orthopnea. Has not reported any cough or hemoptysis or hematemesis. Does not report any nausea or vomiting or abdominal pain. Does not report any constipation or diarrhea. Does not report any hematochezia or melena. Does not report any skin rashes or lesions. Rest of his review of system is unremarkable.  Medications: I have reviewed the patient's current medications.  Current Outpatient Prescriptions  Medication Sig Dispense Refill  . acetaminophen (TYLENOL) 500 MG tablet Take 1,000 mg by mouth 2 (two) times daily. Takes two tablets in the morning and two tablets at night    . amLODipine (NORVASC) 10 MG tablet Take 1 tablet (10 mg total) by mouth daily. 90 tablet 3  . atorvastatin (LIPITOR) 10 MG tablet Take 1 tablet (10 mg total) by mouth daily. 30 tablet 6  . B Complex-C (B-COMPLEX WITH VITAMIN C) tablet Take 1 tablet by mouth daily.      . Denosumab (XGEVA [place]) Inject into the skin every 30 (thirty) days. Monthly. Last inj was 8-19- 2016. Wife not sure what dose it is    . docusate sodium (COLACE) 100 MG capsule Take 100 mg by mouth 2 (two) times daily.    . furosemide (LASIX) 40 MG tablet TAKE 1 TABLET  BY MOUTH DAILY 90 tablet 2  . imiquimod (ALDARA) 5 % cream Apply 1 application topically 2 (two) times a week.     Marland Kitchen Leuprolide Acetate (LUPRON DEPOT IM) Inject 1 each into the muscle every 6 (six) months. Last inj was in March 2016    . losartan (COZAAR) 100 MG tablet TAKE 1 TABLET BY MOUTH ONCE DAILY 30 tablet 11  . multivitamin (THERAGRAN) per tablet Take 1 tablet by mouth daily. ( with B - Complex )    . Omega-3 Fatty Acids (FISH OIL PO) Take 1 capsule by mouth daily. ( Mega Red )    . ranitidine  (ZANTAC) 150 MG tablet Take 150 mg by mouth at bedtime.    . tamsulosin (FLOMAX) 0.4 MG CAPS capsule Take 1 capsule (0.4 mg total) by mouth daily. 30 capsule 2  . warfarin (COUMADIN) 5 MG tablet TAKE AS DIRECTED BY COUMADIN CLINIC 30 tablet 3  . XTANDI 40 MG capsule Take 40 mg by mouth daily.  0  . ZETIA 10 MG tablet TAKE 1 TABLET BY MOUTH ONCE DAILY 90 tablet 3   No current facility-administered medications for this visit.     Allergies:  Allergies  Allergen Reactions  . Pravachol Other (See Comments)    Muscle weakness  . Zocor [Simvastatin - High Dose] Other (See Comments)    Muscle weakness    Past Medical History, Surgical history, Social history, and Family History were reviewed and updated.   Physical Exam: Blood pressure 160/67, pulse 66, temperature 97.4 F (36.3 C), temperature source Oral, resp. rate 17, height 6' (1.829 m), SpO2 96 %. ECOG: 2 General appearance: Elderly, frail gentleman without distress. Head: Normocephalic, without obvious abnormality no scleral icterus. Oral mucosa showed to be moist and pink without ulcers. Neck: no adenopathy or masses. Lymph nodes: Cervical, supraclavicular, and axillary nodes normal. Heart:  Irregular rhythm without any murmurs or gallops. Lung:chest clear, no wheezing, rales, no dullness to percussion. Abdomin: soft, non-tender, without masses or organomegaly. No rebound or guarding. EXT: No cyanosis or edema noted. Neurological examination: No deficits noted.  Lab Results: Lab Results  Component Value Date   WBC 7.1 07/22/2015   HGB 13.9 07/22/2015   HCT 42.5 07/22/2015   MCV 96.1 07/22/2015   PLT 245 07/22/2015     Chemistry      Component Value Date/Time   NA 140 07/15/2015 1156   NA 140 06/04/2015 1415   K 4.6 07/15/2015 1156   K 4.1 06/04/2015 1415   CL 103 07/15/2015 1156   CO2 26 07/15/2015 1156   CO2 25 06/04/2015 1415   BUN 28* 07/15/2015 1156   BUN 45.9* 06/04/2015 1415   CREATININE 1.34*  07/15/2015 1156   CREATININE 1.5* 06/04/2015 1415   CREATININE 1.40* 12/31/2014 1354      Component Value Date/Time   CALCIUM 10.3 07/15/2015 1156   CALCIUM 10.0 06/04/2015 1415   ALKPHOS 70 07/15/2015 1156   ALKPHOS 57 06/04/2015 1415   AST 18 07/15/2015 1156   AST 14 06/04/2015 1415   ALT 14 07/15/2015 1156   ALT <9 06/04/2015 1415   BILITOT 0.8 07/15/2015 1156   BILITOT 0.50 06/04/2015 1415       Results for CURREN, DELON (MRN [id number]) as of 07/22/2015 12:30  Ref. Range 04/23/2015 14:22 04/23/2015 14:22 06/04/2015 14:15  PSA Latest Ref Range: 0.0-4.0 ng/mL 7.42 (H) 7.7 (H) 8.5 (H)        Impression and Plan:  82 year old gentleman  with the following issues:   1. Castration resistant prostate cancer with metastatic disease to the bone. He started on xtandi in September 2014 and have required dose reduction to 40 mg since that time.   He continues to tolerate the current dose without any major decline in his quality of life. His PSA slightly elevated but no major clinical changes. I have recommended continuing the same dose and schedule for the time being. Zytiga can be used in the future if he develops symptomatic progression.  2. Androgen depravation: He is to continue Lupron at this time. He was given by Dr. Junious Silk at [facility]. He stated on 06/03/2015.  3. Bone directed therapy: continues to be on Xgeva and have tolerated it well. I recommended continuing to receive this at [facility] as he is doing now.  4. Hypercalcemia: His calcium remains within normal range. No intervention needed at this time.  5. Bony metastasis: He have received radiation therapy to the lumbar spine as well as the right femur. Radiation can be used in the future if he develops any other pain issues.  6. Pulmonary embolism: He is currently anticoagulated with Coumadin. No further thrombosis or bleeding episodes noted.  7. Followup: Will be on and 6 weeks.  Zola Button,  MD 5/30/201712:28 PM

## 2015-07-23 ENCOUNTER — Telehealth: Payer: Self-pay | Admitting: *Deleted

## 2015-07-23 LAB — PSA: PROSTATE SPECIFIC AG, SERUM: 10.6 ng/mL — AB (ref 0.0–4.0)

## 2015-07-23 NOTE — Telephone Encounter (Signed)
-----   Message from Wyatt Portela, MD sent at 07/23/2015  8:13 AM EDT ----- Please let his wife know about PSA. Slightly up but no change is needed.

## 2015-07-23 NOTE — Telephone Encounter (Signed)
Wife Craig [name] returned my call. Gave results of labs done yesterday and mailed hard copy to patient's home.

## 2015-07-23 NOTE — Telephone Encounter (Signed)
Left message for patient's wife betty to call me. Re: labs

## 2015-08-01 ENCOUNTER — Other Ambulatory Visit: Payer: Self-pay | Admitting: Oncology

## 2015-08-13 ENCOUNTER — Other Ambulatory Visit: Payer: Self-pay | Admitting: *Deleted

## 2015-08-13 MED ORDER — LOSARTAN POTASSIUM 100 MG PO TABS: 100.0000 mg | ORAL_TABLET | Freq: Every day | ORAL | Status: DC

## 2015-08-22 ENCOUNTER — Other Ambulatory Visit: Payer: Self-pay | Admitting: *Deleted

## 2015-08-22 MED ORDER — ATORVASTATIN CALCIUM 10 MG PO TABS
10.0000 mg | ORAL_TABLET | Freq: Every day | ORAL | Status: DC
Start: 2015-08-22 — End: 2016-07-21

## 2015-08-25 ENCOUNTER — Ambulatory Visit (INDEPENDENT_AMBULATORY_CARE_PROVIDER_SITE_OTHER): Payer: Medicare Other | Admitting: *Deleted

## 2015-08-25 DIAGNOSIS — I482 Chronic atrial fibrillation, unspecified: Secondary | ICD-10-CM

## 2015-08-25 DIAGNOSIS — I4891 Unspecified atrial fibrillation: Secondary | ICD-10-CM | POA: Diagnosis not present

## 2015-08-25 DIAGNOSIS — Z5181 Encounter for therapeutic drug level monitoring: Secondary | ICD-10-CM

## 2015-08-25 LAB — POCT INR: INR: 2.2

## 2015-09-01 ENCOUNTER — Other Ambulatory Visit: Payer: Self-pay | Admitting: Oncology

## 2015-09-02 ENCOUNTER — Other Ambulatory Visit: Payer: Self-pay | Admitting: *Deleted

## 2015-09-02 DIAGNOSIS — C61 Malignant neoplasm of prostate: Secondary | ICD-10-CM

## 2015-09-02 DIAGNOSIS — C7951 Secondary malignant neoplasm of bone: Secondary | ICD-10-CM

## 2015-09-02 MED ORDER — ENZALUTAMIDE 40 MG PO CAPS: 40.0000 mg | ORAL_CAPSULE | Freq: Every day | ORAL | Status: DC

## 2015-09-03 ENCOUNTER — Ambulatory Visit: Payer: Medicare Other | Admitting: Oncology

## 2015-09-03 ENCOUNTER — Other Ambulatory Visit: Payer: Medicare Other

## 2015-09-04 ENCOUNTER — Other Ambulatory Visit: Payer: Self-pay | Admitting: *Deleted

## 2015-09-04 ENCOUNTER — Telehealth: Payer: Self-pay | Admitting: Oncology

## 2015-09-04 NOTE — Telephone Encounter (Signed)
per pof to sch pt appt-talked w/spouse statted cant keep that time-trans to [facility] desk nurse

## 2015-09-05 ENCOUNTER — Telehealth: Payer: Self-pay | Admitting: *Deleted

## 2015-09-05 ENCOUNTER — Telehealth: Payer: Self-pay | Admitting: Oncology

## 2015-09-05 NOTE — Telephone Encounter (Signed)
per staci to add lab-she will call pt

## 2015-09-05 NOTE — Telephone Encounter (Signed)
Returned wife's phone call regarding appointments that needed to be rescheduled. New appointments are Tuesday, July 18, at 12 (lab) and 12:30 (MD). Patient will be coming by EMS due to not being able to put him in the car. Wife verbalized understanding.

## 2015-09-05 NOTE — Telephone Encounter (Signed)
per pof to sch pt appt-gave pt copy of avs °

## 2015-09-09 ENCOUNTER — Ambulatory Visit (HOSPITAL_BASED_OUTPATIENT_CLINIC_OR_DEPARTMENT_OTHER): Payer: Medicare Other | Admitting: Oncology

## 2015-09-09 ENCOUNTER — Encounter: Payer: Self-pay | Admitting: *Deleted

## 2015-09-09 ENCOUNTER — Other Ambulatory Visit (HOSPITAL_BASED_OUTPATIENT_CLINIC_OR_DEPARTMENT_OTHER): Payer: Medicare Other

## 2015-09-09 ENCOUNTER — Telehealth: Payer: Self-pay | Admitting: Oncology

## 2015-09-09 VITALS — BP 163/81 | HR 66 | Temp 97.8°F | Resp 16 | Ht 72.0 in

## 2015-09-09 DIAGNOSIS — E291 Testicular hypofunction: Secondary | ICD-10-CM | POA: Diagnosis not present

## 2015-09-09 DIAGNOSIS — C61 Malignant neoplasm of prostate: Secondary | ICD-10-CM

## 2015-09-09 DIAGNOSIS — R531 Weakness: Secondary | ICD-10-CM | POA: Diagnosis not present

## 2015-09-09 DIAGNOSIS — C7951 Secondary malignant neoplasm of bone: Secondary | ICD-10-CM

## 2015-09-09 LAB — CBC WITH DIFFERENTIAL/PLATELET
BASO%: 0.3 % (ref 0.0–2.0)
BASOS ABS: 0 10*3/uL (ref 0.0–0.1)
EOS%: 2.8 % (ref 0.0–7.0)
Eosinophils Absolute: 0.3 10*3/uL (ref 0.0–0.5)
HCT: 40.2 % (ref 38.4–49.9)
HGB: 13.2 g/dL (ref 13.0–17.1)
LYMPH%: 18.4 % (ref 14.0–49.0)
MCH: 31.6 pg (ref 27.2–33.4)
MCHC: 32.9 g/dL (ref 32.0–36.0)
MCV: 95.9 fL (ref 79.3–98.0)
MONO#: 0.9 10*3/uL (ref 0.1–0.9)
MONO%: 9.4 % (ref 0.0–14.0)
NEUT#: 6.6 10*3/uL — ABNORMAL HIGH (ref 1.5–6.5)
NEUT%: 69.1 % (ref 39.0–75.0)
Platelets: 168 10*3/uL (ref 140–400)
RBC: 4.19 10*6/uL — AB (ref 4.20–5.82)
RDW: 15.1 % — AB (ref 11.0–14.6)
WBC: 9.5 10*3/uL (ref 4.0–10.3)
lymph#: 1.7 10*3/uL (ref 0.9–3.3)

## 2015-09-09 LAB — COMPREHENSIVE METABOLIC PANEL
ALT: 9 U/L (ref 0–55)
AST: 13 U/L (ref 5–34)
Albumin: 3.1 g/dL — ABNORMAL LOW (ref 3.5–5.0)
Alkaline Phosphatase: 63 U/L (ref 40–150)
Anion Gap: 9 mEq/L (ref 3–11)
BUN: 34.8 mg/dL — AB (ref 7.0–26.0)
CHLORIDE: 105 meq/L (ref 98–109)
CO2: 26 mEq/L (ref 22–29)
Calcium: 10.1 mg/dL (ref 8.4–10.4)
Creatinine: 1.2 mg/dL (ref 0.7–1.3)
EGFR: 52 mL/min/{1.73_m2} — ABNORMAL LOW (ref >90)
GLUCOSE: 127 mg/dL (ref 70–140)
POTASSIUM: 4.1 meq/L (ref 3.5–5.1)
SODIUM: 140 meq/L (ref 136–145)
Total Bilirubin: 0.55 mg/dL (ref 0.20–1.20)
Total Protein: 7 g/dL (ref 6.4–8.3)

## 2015-09-09 NOTE — Progress Notes (Signed)
Hematology and Oncology Follow Up Visit  Craig [name] [id number] [birth year] 82 y.o. 09/09/2015 12:41 PM No PCP Per PatientNo ref. provider found   Principle Diagnosis: 82 year old with Castration resistant prostate cancer with metastatic disease to the bone. He was initially diagnosed in 2011 PSA of 19 and presented with advanced disease.   Prior Therapy: He is status post combined androgen deprivation with Lupron and Casodex with an excellent response initially and had a PSA nadir down to 1.67. Most recently he developed progression of disease and a PSA up to 5.94 in September of 2014. He is status post SRS treatment to the L4 completed on 02/13/2014. He is S/P stereotactic body radiotherapy to the iliac bone treatment completed in 02/2014. He is status post radiation therapy to the right femur completed on 06/07/2014.  Current therapy: He is on Xtandi started on 11/15/2012. He was started on 160 mg initially but the dose was reduced to 80 mg in December of 2014. Treatment has been on hold for 2 months. Xtandi resumed at 40 mg daily starting on 06/12/2014.  He is on Lupron and Xgeva done at [facility]. His last Lupron given on 06/03/2015. .  Interim History: Craig [name] presents today for a followup visit with his wife. Since the last visit, he reported episodes of lower extremity weakness without back pain or neurological deficits. He has been having difficulties with turning and maintaining strength in his lower extremities. He had to be transported via ambulance at this time.  He continues to be on Xtandi of the current dose without any further decline in his energy or performance status. He denied any dyspepsia or GI symptoms. He continues to have a Foley catheter in place and is changed monthly. This is due for change today.   He does not report any headaches, blurry vision, syncope or seizures. He does not report any chest pain, palpitation orthopnea. Has not reported any  cough or hemoptysis or hematemesis. Does not report any nausea or vomiting or abdominal pain. Does not report any constipation or diarrhea. Does not report any hematochezia or melena. Does not report any skin rashes or lesions. Rest of his review of system is unremarkable.  Medications: I have reviewed the patient's current medications.  Current Outpatient Prescriptions  Medication Sig Dispense Refill  . acetaminophen (TYLENOL) 500 MG tablet Take 1,000 mg by mouth 2 (two) times daily. Takes two tablets in the morning and two tablets at night    . amLODipine (NORVASC) 10 MG tablet Take 1 tablet (10 mg total) by mouth daily. 90 tablet 3  . atorvastatin (LIPITOR) 10 MG tablet Take 1 tablet (10 mg total) by mouth daily. 30 tablet 11  . B Complex-C (B-COMPLEX WITH VITAMIN C) tablet Take 1 tablet by mouth daily.      . Denosumab (XGEVA [place]) Inject into the skin every 30 (thirty) days. Monthly. Last inj was 8-19- 2016. Wife not sure what dose it is    . docusate sodium (COLACE) 100 MG capsule Take 100 mg by mouth 2 (two) times daily.    . enzalutamide (XTANDI) 40 MG capsule Take 1 capsule (40 mg total) by mouth daily. 30 capsule 0  . furosemide (LASIX) 40 MG tablet TAKE 1 TABLET BY MOUTH DAILY 90 tablet 2  . imiquimod (ALDARA) 5 % cream Apply 1 application topically 2 (two) times a week.     Marland Kitchen Leuprolide Acetate (LUPRON DEPOT IM) Inject 1 each into the muscle every 6 (six) months.  Last inj was in March 2016    . losartan (COZAAR) 100 MG tablet Take 1 tablet (100 mg total) by mouth daily. 90 tablet 2  . multivitamin (THERAGRAN) per tablet Take 1 tablet by mouth daily. ( with B - Complex )    . Omega-3 Fatty Acids (FISH OIL PO) Take 1 capsule by mouth daily. ( Mega Red )    . ranitidine (ZANTAC) 150 MG tablet Take 150 mg by mouth at bedtime.    . tamsulosin (FLOMAX) 0.4 MG CAPS capsule Take 1 capsule (0.4 mg total) by mouth daily. 30 capsule 2  . warfarin (COUMADIN) 5 MG tablet TAKE AS DIRECTED BY COUMADIN  CLINIC 30 tablet 3  . ZETIA 10 MG tablet TAKE 1 TABLET BY MOUTH ONCE DAILY 90 tablet 3   No current facility-administered medications for this visit.     Allergies:  Allergies  Allergen Reactions  . Pravachol Other (See Comments)    Muscle weakness  . Zocor [Simvastatin - High Dose] Other (See Comments)    Muscle weakness    Past Medical History, Surgical history, Social history, and Family History were reviewed and updated.   Physical Exam: Blood pressure 163/81, pulse 66, temperature 97.8 F (36.6 C), temperature source Oral, resp. rate 16, height 6' (1.829 m), SpO2 99 %. ECOG: 2 General appearance: Alert, awake gentleman without distress. Head: Normocephalic, without obvious abnormality no oral ulcers or lesions. Neck: no adenopathy or masses. Lymph nodes: Cervical, supraclavicular, and axillary nodes normal. Heart:  Irregular rhythm without any murmurs or gallops. Lung:chest clear, no wheezing, rales, no dullness to percussion. Abdomin: soft, non-tender, without masses or organomegaly. No rebound or guarding. EXT: No cyanosis or edema noted. Neurological examination: No motor deficits noted in his lower extremities.  Lab Results: Lab Results  Component Value Date   WBC 9.5 09/09/2015   HGB 13.2 09/09/2015   HCT 40.2 09/09/2015   MCV 95.9 09/09/2015   PLT 168 09/09/2015     Chemistry      Component Value Date/Time   NA 140 07/22/2015 1144   NA 140 07/15/2015 1156   K 4.3 07/22/2015 1144   K 4.6 07/15/2015 1156   CL 103 07/15/2015 1156   CO2 26 07/22/2015 1144   CO2 26 07/15/2015 1156   BUN 34.7* 07/22/2015 1144   BUN 28* 07/15/2015 1156   CREATININE 1.3 07/22/2015 1144   CREATININE 1.34* 07/15/2015 1156   CREATININE 1.40* 12/31/2014 1354      Component Value Date/Time   CALCIUM 10.1 07/22/2015 1144   CALCIUM 10.3 07/15/2015 1156   ALKPHOS 69 07/22/2015 1144   ALKPHOS 70 07/15/2015 1156   AST 16 07/22/2015 1144   AST 18 07/15/2015 1156   ALT 13  07/22/2015 1144   ALT 14 07/15/2015 1156   BILITOT 0.42 07/22/2015 1144   BILITOT 0.8 07/15/2015 1156      Results for Craig [name], Craig [name] (MRN [id number]) as of 09/09/2015 12:30  Ref. Range 04/23/2015 14:22 06/04/2015 14:15 07/22/2015 11:44  PSA Latest Ref Range: 0.0-4.0 ng/mL 7.7 (H) 8.5 (H) 10.6 (H)      Impression and Plan:  82 year old gentleman with the following issues:   1. Castration resistant prostate cancer with metastatic disease to the bone. He started on xtandi in September 2014 and have required dose reduction to 40 mg since that time.   He continues to tolerate the current dose without any major Changes. He does report more weakness in his lower extremities but likely more  generalized than focal. His PSA did show slight increase since the last visit and the plan is to continue with the same dose and schedule for now. I plan on repeating a bone scan in the near future and uses different salvage regimen if his PSA continues to rise or has progression of disease.  2. Androgen depravation: He is to continue Lupron at this time. He was given by Dr. Junious Silk at [facility]. He stated on 06/03/2015.  3. Bone directed therapy: continues to be on Xgeva and have tolerated it well. I recommended continuing to receive this at [facility] as he is doing now.  4. Hypercalcemia: His calcium remains within normal range. No intervention needed at this time.  5. Bony metastasis: He have received radiation therapy to the lumbar spine as well as the right femur. Radiation can be used in the future if he develops any other pain issues.  6. Generalized weakness: Unlikely related to his cancer but always a possibility.  7. Followup: in 4 weeks.  Zola Button, MD 7/18/201712:41 PM

## 2015-09-09 NOTE — Telephone Encounter (Signed)
Gave pt cal & avs °

## 2015-09-10 ENCOUNTER — Telehealth: Payer: Self-pay | Admitting: *Deleted

## 2015-09-10 LAB — PSA: Prostate Specific Ag, Serum: 13 ng/mL — ABNORMAL HIGH (ref 0.0–4.0)

## 2015-09-10 NOTE — Telephone Encounter (Signed)
-----   Message from Wyatt Portela, MD sent at 09/10/2015  8:39 AM EDT ----- Please let him/his wife know his PSA is slightly up. No changes for now.

## 2015-09-10 NOTE — Telephone Encounter (Signed)
Spoke with wife betty, gave results of last PSA. No changes for now

## 2015-10-03 ENCOUNTER — Other Ambulatory Visit: Payer: Self-pay | Admitting: Oncology

## 2015-10-03 DIAGNOSIS — C61 Malignant neoplasm of prostate: Secondary | ICD-10-CM

## 2015-10-03 DIAGNOSIS — C7951 Secondary malignant neoplasm of bone: Secondary | ICD-10-CM

## 2015-10-06 ENCOUNTER — Ambulatory Visit (INDEPENDENT_AMBULATORY_CARE_PROVIDER_SITE_OTHER): Payer: Medicare Other | Admitting: Pharmacist

## 2015-10-06 DIAGNOSIS — Z5181 Encounter for therapeutic drug level monitoring: Secondary | ICD-10-CM | POA: Diagnosis not present

## 2015-10-06 DIAGNOSIS — I482 Chronic atrial fibrillation, unspecified: Secondary | ICD-10-CM

## 2015-10-06 DIAGNOSIS — I4891 Unspecified atrial fibrillation: Secondary | ICD-10-CM | POA: Diagnosis not present

## 2015-10-06 LAB — POCT INR: INR: 1.5

## 2015-10-10 ENCOUNTER — Encounter (HOSPITAL_COMMUNITY)
Admission: RE | Admit: 2015-10-10 | Discharge: 2015-10-10 | Disposition: A | Discharge status: Home / Self Care | Payer: Medicare Other | Source: Ambulatory Visit | Attending: Oncology | Admitting: Oncology

## 2015-10-10 DIAGNOSIS — C7951 Secondary malignant neoplasm of bone: Secondary | ICD-10-CM | POA: Insufficient documentation

## 2015-10-10 DIAGNOSIS — C61 Malignant neoplasm of prostate: Secondary | ICD-10-CM | POA: Diagnosis present

## 2015-10-10 MED ORDER — TECHNETIUM TC 99M MEDRONATE IV KIT
22.0000 | PACK | Freq: Once | INTRAVENOUS | Status: AC | PRN
  Administered 2015-10-10: 22 via INTRAVENOUS

## 2015-10-14 ENCOUNTER — Other Ambulatory Visit (HOSPITAL_BASED_OUTPATIENT_CLINIC_OR_DEPARTMENT_OTHER): Payer: Medicare Other

## 2015-10-14 ENCOUNTER — Telehealth: Payer: Self-pay | Admitting: Oncology

## 2015-10-14 ENCOUNTER — Ambulatory Visit (HOSPITAL_BASED_OUTPATIENT_CLINIC_OR_DEPARTMENT_OTHER): Payer: Medicare Other | Admitting: Oncology

## 2015-10-14 VITALS — BP 136/72 | HR 66 | Temp 97.7°F | Resp 16 | Ht 72.0 in

## 2015-10-14 DIAGNOSIS — C61 Malignant neoplasm of prostate: Secondary | ICD-10-CM

## 2015-10-14 DIAGNOSIS — C7951 Secondary malignant neoplasm of bone: Secondary | ICD-10-CM

## 2015-10-14 DIAGNOSIS — E291 Testicular hypofunction: Secondary | ICD-10-CM

## 2015-10-14 DIAGNOSIS — R531 Weakness: Secondary | ICD-10-CM | POA: Diagnosis not present

## 2015-10-14 LAB — CBC WITH DIFFERENTIAL/PLATELET
BASO%: 0.5 % (ref 0.0–2.0)
Basophils Absolute: 0 10*3/uL (ref 0.0–0.1)
EOS ABS: 0.3 10*3/uL (ref 0.0–0.5)
EOS%: 4.1 % (ref 0.0–7.0)
HCT: 42.5 % (ref 38.4–49.9)
HEMOGLOBIN: 13.7 g/dL (ref 13.0–17.1)
LYMPH#: 1.2 10*3/uL (ref 0.9–3.3)
LYMPH%: 16 % (ref 14.0–49.0)
MCH: 30.9 pg (ref 27.2–33.4)
MCHC: 32.3 g/dL (ref 32.0–36.0)
MCV: 95.8 fL (ref 79.3–98.0)
MONO#: 0.6 10*3/uL (ref 0.1–0.9)
MONO%: 8.4 % (ref 0.0–14.0)
NEUT%: 71 % (ref 39.0–75.0)
NEUTROS ABS: 5.4 10*3/uL (ref 1.5–6.5)
PLATELETS: 215 10*3/uL (ref 140–400)
RBC: 4.43 10*6/uL (ref 4.20–5.82)
RDW: 15.3 % — AB (ref 11.0–14.6)
WBC: 7.6 10*3/uL (ref 4.0–10.3)

## 2015-10-14 LAB — COMPREHENSIVE METABOLIC PANEL
ALBUMIN: 3 g/dL — AB (ref 3.5–5.0)
ALK PHOS: 64 U/L (ref 40–150)
ALT: 9 U/L (ref 0–55)
ANION GAP: 10 meq/L (ref 3–11)
AST: 13 U/L (ref 5–34)
BILIRUBIN TOTAL: 0.58 mg/dL (ref 0.20–1.20)
BUN: 39.2 mg/dL — ABNORMAL HIGH (ref 7.0–26.0)
CO2: 26 mEq/L (ref 22–29)
Calcium: 10.4 mg/dL (ref 8.4–10.4)
Chloride: 104 mEq/L (ref 98–109)
Creatinine: 1.5 mg/dL — ABNORMAL HIGH (ref 0.7–1.3)
EGFR: 42 mL/min/{1.73_m2} — AB (ref >90)
GLUCOSE: 148 mg/dL — AB (ref 70–140)
POTASSIUM: 4.1 meq/L (ref 3.5–5.1)
Sodium: 140 mEq/L (ref 136–145)
TOTAL PROTEIN: 7.1 g/dL (ref 6.4–8.3)

## 2015-10-14 NOTE — Progress Notes (Signed)
Hematology and Oncology Follow Up Visit  Craig [name] [id number] 05/25/1924 82 y.o. 10/14/2015 12:28 PM No PCP Per PatientNo ref. provider found   Principle Diagnosis: 82 year old with Castration resistant prostate cancer with metastatic disease to the bone. He was initially diagnosed in 2011 PSA of 19 and presented with advanced disease.   Prior Therapy: He is status post combined androgen deprivation with Lupron and Casodex with an excellent response initially and had a PSA nadir down to 1.67. Most recently he developed progression of disease and a PSA up to 5.94 in September of 2014. He is status post SRS treatment to the L4 completed on 02/13/2014. He is S/P stereotactic body radiotherapy to the iliac bone treatment completed in 02/2014. He is status post radiation therapy to the right femur completed on 06/07/2014.  Current therapy: He is on Xtandi started on 11/15/2012. He was started on 160 mg initially but the dose was reduced to 80 mg in December of 2014. Treatment has been on hold for 2 months. Xtandi resumed at 40 mg daily starting on 06/12/2014.  He is on Lupron and Xgeva done at [facility].  .  Interim History: [name] presents today for a followup visit with his wife. Since the last visit, he reports improvement in his overall health and strength. His wife reports that she is able to get him into the car and had a bone scan last week and will have a catheter placement today.  He denied any recent falls or syncope. He denied any pathological fractures.  He continues to be on Xtandi of the current dose without any further decline in his energy or performance status. He denied any dyspepsia or GI symptoms.    He does not report any headaches, blurry vision, syncope or seizures. He does not report any chest pain, palpitation orthopnea. Has not reported any cough or hemoptysis or hematemesis. Does not report any nausea or vomiting or abdominal pain. Does not report  any constipation or diarrhea. Does not report any hematochezia or melena. Does not report any skin rashes or lesions. Rest of his review of system is unremarkable.  Medications: I have reviewed the patient's current medications.  Current Outpatient Prescriptions  Medication Sig Dispense Refill  . acetaminophen (TYLENOL) 500 MG tablet Take 1,000 mg by mouth 2 (two) times daily. Takes two tablets in the morning and two tablets at night    . amLODipine (NORVASC) 10 MG tablet Take 1 tablet (10 mg total) by mouth daily. 90 tablet 3  . atorvastatin (LIPITOR) 10 MG tablet Take 1 tablet (10 mg total) by mouth daily. 30 tablet 11  . B Complex-C (B-COMPLEX WITH VITAMIN C) tablet Take 1 tablet by mouth daily.      . Denosumab (XGEVA [place]) Inject into the skin every 30 (thirty) days. Monthly. Last inj was 8-19- 2016. Wife not sure what dose it is    . docusate sodium (COLACE) 100 MG capsule Take 100 mg by mouth 2 (two) times daily.    . furosemide (LASIX) 40 MG tablet TAKE 1 TABLET BY MOUTH DAILY 90 tablet 2  . imiquimod (ALDARA) 5 % cream Apply 1 application topically 2 (two) times a week.     Marland Kitchen Leuprolide Acetate (LUPRON DEPOT IM) Inject 1 each into the muscle every 6 (six) months. Last inj was in March 2016    . losartan (COZAAR) 100 MG tablet Take 1 tablet (100 mg total) by mouth daily. 90 tablet 2  . multivitamin (  THERAGRAN) per tablet Take 1 tablet by mouth daily. ( with B - Complex )    . Omega-3 Fatty Acids (FISH OIL PO) Take 1 capsule by mouth daily. ( Mega Red )    . ranitidine (ZANTAC) 150 MG tablet Take 150 mg by mouth at bedtime.    . tamsulosin (FLOMAX) 0.4 MG CAPS capsule Take 1 capsule (0.4 mg total) by mouth daily. 30 capsule 2  . warfarin (COUMADIN) 5 MG tablet TAKE AS DIRECTED BY COUMADIN CLINIC 30 tablet 3  . XTANDI 40 MG capsule TAKE 1 CAPSULE BY MOUTH ONCE DAILY 30 capsule 0  . ZETIA 10 MG tablet TAKE 1 TABLET BY MOUTH ONCE DAILY 90 tablet 3   No current facility-administered  medications for this visit.      Allergies:  Allergies  Allergen Reactions  . Pravachol Other (See Comments)    Muscle weakness  . Zocor [Simvastatin - High Dose] Other (See Comments)    Muscle weakness    Past Medical History, Surgical history, Social history, and Family History were reviewed and updated.   Physical Exam: Blood pressure 136/72, pulse 66, temperature 97.7 F (36.5 C), temperature source Oral, resp. rate 16, height 6' (1.829 m), SpO2 98 %. ECOG: 2 General appearance: Well-appearing gentleman sitting in a wheelchair without distress. Head: Normocephalic, without obvious abnormality no oral thrush noted. Neck: no adenopathy or masses. Lymph nodes: Cervical, supraclavicular, and axillary nodes normal. Heart:  Irregular rhythm without any murmurs or gallops. Lung:chest clear, no wheezing, rales, no dullness to percussion. Abdomin: soft, non-tender, without masses or organomegaly. No shifting dullness or ascites. EXT: No cyanosis or edema noted. Neurological examination: No deficits noted.  Lab Results: Lab Results  Component Value Date   WBC 7.6 10/14/2015   HGB 13.7 10/14/2015   HCT 42.5 10/14/2015   MCV 95.8 10/14/2015   PLT 215 10/14/2015     Chemistry      Component Value Date/Time   NA 140 09/09/2015 1215   K 4.1 09/09/2015 1215   CL 103 07/15/2015 1156   CO2 26 09/09/2015 1215   BUN 34.8 (H) 09/09/2015 1215   CREATININE 1.2 09/09/2015 1215      Component Value Date/Time   CALCIUM 10.1 09/09/2015 1215   ALKPHOS 63 09/09/2015 1215   AST 13 09/09/2015 1215   ALT 9 09/09/2015 1215   BILITOT 0.55 09/09/2015 1215      Results for LAWARNCE, [name] (MRN [id number]) as of 09/09/2015 12:30  Ref. Range 04/23/2015 14:22 06/04/2015 14:15 07/22/2015 11:44  PSA Latest Ref Range: 0.0-4.0 ng/mL 7.7 (H) 8.5 (H) 10.6 (H)    Results for Craig, [name] (MRN [id number]) as of 10/14/2015 12:14  Ref. Range 06/04/2015 14:15 07/22/2015 11:44 09/09/2015 12:15   PSA Latest Ref Range: 0.0 - 4.0 ng/mL 8.5 (H) 10.6 (H) 13.0 (H)   CLINICAL DATA:  Metastatic prostate cancer to bone, BILATERAL knee pain, occasional low back pain normal fell 4 years ago with question fractured LEFT ribs  EXAM: NUCLEAR MEDICINE WHOLE BODY BONE SCAN  TECHNIQUE: Whole body anterior and posterior images were obtained approximately 3 hours after intravenous injection of radiopharmaceutical.  RADIOPHARMACEUTICALS:  22 mCi Technetium-38m MDP IV  COMPARISON:  10/11/2014, 08/07/2012  Radiographic correlation: None recent  FINDINGS: Mild uptake at a posterolateral lower RIGHT rib approximately 8th, stable.  Increased tracer localization posterior LEFT 10th rib and anterolateral LEFT 9th rib, stable.  Stable subtle increased tracer localization at anterior LEFT iliac bone.  Uptake at  the shoulders, sternoclavicular joints, elbows, knees, and feet, typically degenerative.  Catheter with urinary reservoir at medial RIGHT thigh.  No additional abnormal foci of increased osseous tracer accumulation identified.  No LEFT renal tracer visualized, stable.  Otherwise expected urinary tract and soft tissue distribution of tracer.  IMPRESSION: Stable foci of abnormal osseous tracer accumulation and multiple ribs.  Stable increased osseous accumulation at anterior LEFT iliac wing.  No new focal scintigraphic abnormalities identified.   Impression and Plan:  82 year old gentleman with the following issues:   1. Castration resistant prostate cancer with metastatic disease to the bone. He started on xtandi in September 2014 and have required dose reduction to 40 mg since that time.   His last PSA did show increased uptake 13 and his bone scan was personally reviewed today. His bone scan was discussed which showed stable disease without any new areas of concern. Despite the rise in his PSA, his clinical status remain stable and we have elected to  keep him on the current dose and regimen. Different salvage therapy will be utilized upon symptomatic progression. Zytiga would be the next treatment of choice.  2. Androgen depravation: He is to continue Lupron at this time. He was given by Dr. Junious Silk at [facility].   3. Bone directed therapy: continues to be on Xgeva and have tolerated it well. I recommended continuing to receive this at [facility] as he is doing now.  4. Hypercalcemia: His calcium continues to be normal based on the most recent laboratory testing in July 2017.  5. Bony metastasis: He have received radiation therapy to the lumbar spine as well as the right femur. Radiation can be used in the future if he develops any other pain issues. Bone scan did not show any progression of disease or no new lesions.  6. Generalized weakness: Seems to have improved at this time which makes it unlikely related to his cancer.  7. Followup: in 4 weeks.  Zola Button, MD 8/22/201712:28 PM

## 2015-10-14 NOTE — Telephone Encounter (Signed)
Gave relative avs report and appointments for September  °

## 2015-10-15 ENCOUNTER — Encounter (HOSPITAL_COMMUNITY): Payer: Self-pay | Admitting: Neurology

## 2015-10-15 ENCOUNTER — Emergency Department (HOSPITAL_COMMUNITY)
Admission: EM | Admit: 2015-10-15 | Discharge: 2015-10-15 | Disposition: A | Discharge status: Home / Self Care | Payer: Medicare Other | Attending: Emergency Medicine | Admitting: Emergency Medicine

## 2015-10-15 ENCOUNTER — Telehealth: Payer: Self-pay | Admitting: *Deleted

## 2015-10-15 DIAGNOSIS — T839XXA Unspecified complication of genitourinary prosthetic device, implant and graft, initial encounter: Secondary | ICD-10-CM

## 2015-10-15 DIAGNOSIS — Y732 Prosthetic and other implants, materials and accessory gastroenterology and urology devices associated with adverse incidents: Secondary | ICD-10-CM | POA: Diagnosis not present

## 2015-10-15 DIAGNOSIS — R339 Retention of urine, unspecified: Secondary | ICD-10-CM

## 2015-10-15 DIAGNOSIS — T83098A Other mechanical complication of other indwelling urethral catheter, initial encounter: Secondary | ICD-10-CM | POA: Insufficient documentation

## 2015-10-15 DIAGNOSIS — Z85820 Personal history of malignant melanoma of skin: Secondary | ICD-10-CM | POA: Diagnosis not present

## 2015-10-15 DIAGNOSIS — Z8546 Personal history of malignant neoplasm of prostate: Secondary | ICD-10-CM | POA: Diagnosis not present

## 2015-10-15 DIAGNOSIS — Z7901 Long term (current) use of anticoagulants: Secondary | ICD-10-CM | POA: Insufficient documentation

## 2015-10-15 DIAGNOSIS — Z87891 Personal history of nicotine dependence: Secondary | ICD-10-CM | POA: Insufficient documentation

## 2015-10-15 DIAGNOSIS — Z8673 Personal history of transient ischemic attack (TIA), and cerebral infarction without residual deficits: Secondary | ICD-10-CM | POA: Insufficient documentation

## 2015-10-15 DIAGNOSIS — I129 Hypertensive chronic kidney disease with stage 1 through stage 4 chronic kidney disease, or unspecified chronic kidney disease: Secondary | ICD-10-CM | POA: Insufficient documentation

## 2015-10-15 DIAGNOSIS — N183 Chronic kidney disease, stage 3 (moderate): Secondary | ICD-10-CM | POA: Diagnosis not present

## 2015-10-15 LAB — URINALYSIS, ROUTINE W REFLEX MICROSCOPIC
BILIRUBIN URINE: NEGATIVE
GLUCOSE, UA: NEGATIVE mg/dL
KETONES UR: NEGATIVE mg/dL
NITRITE: NEGATIVE
PROTEIN: NEGATIVE mg/dL
Specific Gravity, Urine: 1.011 (ref 1.005–1.030)
pH: 5.5 (ref 5.0–8.0)

## 2015-10-15 LAB — URINE MICROSCOPIC-ADD ON

## 2015-10-15 LAB — I-STAT CHEM 8, ED
BUN: 41 mg/dL — ABNORMAL HIGH (ref 6–20)
CALCIUM ION: 1.3 mmol/L — AB (ref 1.12–1.23)
CREATININE: 1.4 mg/dL — AB (ref 0.61–1.24)
Chloride: 99 mmol/L — ABNORMAL LOW (ref 101–111)
Glucose, Bld: 158 mg/dL — ABNORMAL HIGH (ref 65–99)
HEMATOCRIT: 41 % (ref 39.0–52.0)
HEMOGLOBIN: 13.9 g/dL (ref 13.0–17.0)
POTASSIUM: 4.1 mmol/L (ref 3.5–5.1)
Sodium: 138 mmol/L (ref 135–145)
TCO2: 27 mmol/L (ref 0–100)

## 2015-10-15 LAB — PSA: PROSTATE SPECIFIC AG, SERUM: 12.9 ng/mL — AB (ref 0.0–4.0)

## 2015-10-15 MED ORDER — CEPHALEXIN 250 MG PO CAPS
500.0000 mg | ORAL_CAPSULE | Freq: Once | ORAL | Status: AC
  Administered 2015-10-15: 500 mg via ORAL
  Filled 2015-10-15: qty 2

## 2015-10-15 MED ORDER — CEPHALEXIN 500 MG PO CAPS: 500.0000 mg | ORAL_CAPSULE | Freq: Two times a day (BID) | ORAL | 0 refills | Status: DC

## 2015-10-15 NOTE — Telephone Encounter (Signed)
As noted below by Dr. Shadad, I informed the patient of his PSA level. Patient verbalized understanding. 

## 2015-10-15 NOTE — Telephone Encounter (Signed)
-----   Message from Wyatt Portela, MD sent at 10/15/2015  8:38 AM EDT ----- Please let him or his wife know that his PSA is stable and might down some.

## 2015-10-15 NOTE — ED Notes (Signed)
PA and MD notified of catheter balloon not returning water for deflation of balloon.  I was only able to get one ML out via syringe.  Resistance felt when trying to take foley out.  MD notified.

## 2015-10-15 NOTE — ED Notes (Signed)
Patient cleaned with foley care wipes prior to discharge.

## 2015-10-15 NOTE — ED Triage Notes (Signed)
Pt here with urinary catheter that was placed yesterday at urology was draining yesterday. This morning c/o bladder spasms, hasn't had hardly any output. Sent here for recommendation of having urinary catheter changed out. Pt is here with wife. Abdomen appears distended. This morning at 0830 had 300 cc urine in bag.

## 2015-10-15 NOTE — ED Provider Notes (Signed)
[facility] DEPT Provider Note   CSN: [id number] Arrival date & time: 10/15/15  1831  By signing my name below, I, Craig [name], attest that this documentation has been prepared under the direction and in the presence of Gloriann Loan, PA-C. Electronically Signed: Tedra Coupe. Sheppard Coil, ED Scribe. 10/15/15. 7:15 PM.  History   Chief Complaint Chief Complaint  Patient presents with  . Urinary Retention    HPI HPI Comments: Craig [name]. is a 82 y.o. male with PMHx of prostate cancer, GERD, HLD, HTN, CVA, who presents to the Emergency Department complaining of urinary retention that occurred x 0800. Per pt's wife, pt has had a catheter for over a year and a half and had a new catheter placed on 10/14/15, yesterday. She notes that while at Urology yesterday, catheter was draining normal. She reports that patient has had little urinary output since 0800 (300cc) after drinking multiple cups of water. Pt has associated bladder spasms and abdominal pain which is only present upon applying pressure to his lower abdomen. No alleviating factors noted. He has no other complaints at this time. No fever, chills, N/V.  The history is provided by the patient and the spouse. No language interpreter was used.   Past Medical History:  Diagnosis Date  . Atrial fibrillation ([place])   . Chronic back pain   . GERD (gastroesophageal reflux disease)   . H/O hiatal hernia   . History of epistaxis 07/19/2002  . Hyperlipidemia   . Hypertension   . Hypertensive heart disease   . Hypokalemia   . ICH (intracerebral hemorrhage) ([place]) ~ 1999   after TPA/notes 09/27/2012  . Melanoma ([place])    "top of my head" (09/27/2012)  . Migraines    "migraines without headaches years ago" (09/27/2012)  . Osteoarthritis of both feet   . PAT (paroxysmal atrial tachycardia) ([employer])   . Personal history of long-term (current) use of anticoagulants   . Pneumonia    "once; several years ago" (09/27/2012)  . Prostate cancer,  primary, with metastasis from prostate to other site [facility])    on Lupron injections per GU  . S/P radiation therapy 02/13/14-02/27/14   SRS 5/5 spine completed 02/27/14  . S/P radiation therapy 05/27/14-05/31/14   right femur 20Gy/91fx  . Stroke [facility]) ~ 1999   ischemic / right cerebellar/posterior inferior cerebellar artery / right pos infarct    Patient Active Problem List   Diagnosis Date Noted  . Aortic stenosis 07/15/2015  . Stroke ([place])   . Hemispheric carotid artery syndrome   . Cardiomyopathy, ischemic   . Stroke-like symptoms 12/31/2014  . Catheter-associated urinary tract infection ([place]) 12/31/2014  . Hemoptysis 07/12/2014  . Hypertensive urgency 07/12/2014  . Pulmonary embolism ([place])   . Cough with hemoptysis   . Pulmonary embolus ([place]) 07/11/2014  . Fever 04/22/2014  . Dysarthria 04/21/2014  . CVA (cerebral infarction) 04/19/2014  . Prostate cancer, primary, with metastasis from prostate to other site [facility]) 04/19/2014  . Chronic back pain 04/19/2014  . Expressive aphasia   . Bone metastasis ([place]) 02/12/2014  . Chronic diastolic heart failure ([place]) 02/11/2014  . Hypertensive heart disease 02/11/2014  . Essential hypertension 02/11/2014  . History of stroke 02/11/2014  . AAA (abdominal aortic aneurysm) ([place]) 02/11/2014  . Hypokalemia 02/11/2014  . Back pain 02/09/2014  . Fluid overload 02/08/2014  . Left-sided weakness 02/08/2014  . Encounter for therapeutic drug monitoring 05/14/2013  . Chronic kidney disease (CKD), stage III (moderate) 09/27/2012  . Elevated brain  natriuretic peptide (BNP) level 09/27/2012  . Hypercalcemia 09/27/2012  . Weakness 09/27/2012  . Epistaxis 01/08/2011  . Cerebellar stroke syndrome 07/27/2010  . Hypercholesterolemia 07/27/2010  . Prostate cancer metastatic to bone ([place]) 07/27/2010  . Benign hypertensive heart disease without heart failure 07/27/2010  . Renal insufficiency 07/27/2010  . Chronic atrial fibrillation ([place]) 05/25/2010     Past Surgical History:  Procedure Laterality Date  . CATARACT EXTRACTION W/ INTRAOCULAR LENS  IMPLANT, BILATERAL Bilateral ~ 2011  . MELANOMA EXCISION  2010   "pre-melanoma on top of head; did skin graft from left thigh to cover" (09/27/2012)  . NASAL SINUS SURGERY  1998  . SKIN GRAFT Right 2010  . TONSILLECTOMY  ~ [place] Medications    Prior to Admission medications   Medication Sig Start Date End Date Taking? Authorizing Provider  acetaminophen (TYLENOL) 500 MG tablet Take 1,000 mg by mouth 2 (two) times daily. Takes two tablets in the morning and two tablets at night    Historical Provider, MD  amLODipine (NORVASC) 10 MG tablet Take 1 tablet (10 mg total) by mouth daily. 03/17/15   [name], MD  atorvastatin (LIPITOR) 10 MG tablet Take 1 tablet (10 mg total) by mouth daily. 08/22/15   Jettie Booze, MD  B Complex-C (B-COMPLEX WITH VITAMIN C) tablet Take 1 tablet by mouth daily.      Historical Provider, MD  cephALEXin (KEFLEX) 500 MG capsule Take 1 capsule (500 mg total) by mouth 2 (two) times daily. 10/15/15   Gloriann Loan, PA-C  Denosumab (XGEVA [place]) Inject into the skin every 30 (thirty) days. Monthly. Last inj was 8-19- 2016. Wife not sure what dose it is    Historical Provider, MD  docusate sodium (COLACE) 100 MG capsule Take 100 mg by mouth 2 (two) times daily. 09/10/14   Historical Provider, MD  furosemide (LASIX) 40 MG tablet TAKE 1 TABLET BY MOUTH DAILY 03/05/15   [name], MD  imiquimod [place]) 5 % cream Apply 1 application topically 2 (two) times a week.  06/24/15   Historical Provider, MD  Leuprolide Acetate (LUPRON DEPOT IM) Inject 1 each into the muscle every 6 (six) months. Last inj was in March 2016    Historical Provider, MD  losartan (COZAAR) 100 MG tablet Take 1 tablet (100 mg total) by mouth daily. 08/13/15   Jettie Booze, MD  multivitamin [facility]) per tablet Take 1 tablet by mouth daily. ( with B - Complex )    Historical Provider, MD   Omega-3 Fatty Acids (FISH OIL PO) Take 1 capsule by mouth daily. ( Mega Red )    Historical Provider, MD  ranitidine (ZANTAC) 150 MG tablet Take 150 mg by mouth at bedtime.    Historical Provider, MD  tamsulosin (FLOMAX) 0.4 MG CAPS capsule Take 1 capsule (0.4 mg total) by mouth daily. 04/23/14   Oswald Hillock, MD  warfarin (COUMADIN) 5 MG tablet TAKE AS DIRECTED BY COUMADIN CLINIC 06/16/15   Jettie Booze, MD  XTANDI 40 MG capsule TAKE 1 CAPSULE BY MOUTH ONCE DAILY 10/03/15   Wyatt Portela, MD  ZETIA 10 MG tablet TAKE 1 TABLET BY MOUTH ONCE DAILY 02/25/15   [name], MD    Family History Family History  Problem Relation Age of Onset  . Heart failure Mother   . Heart attack Father     Social History Social History  Substance Use Topics  . Smoking status: Former Smoker  Years: 0.50    Types: Cigarettes    Quit date: 02/22/1953  . Smokeless tobacco: Never Used  . Alcohol use No     Comment: 09/27/2012 "glass of wine or 2/month"     Allergies   Pravachol and Zocor [simvastatin - high dose]   Review of Systems Review of Systems  Gastrointestinal: Positive for abdominal pain.  Genitourinary: Positive for decreased urine volume.  All other systems reviewed and are negative.  Physical Exam Updated Vital Signs BP 167/91 (BP Location: Right Arm)   Pulse 79   Temp 98.1 F (36.7 C) (Oral)   Resp 16   SpO2 96%   Physical Exam  Constitutional: He is oriented to person, place, and time. He appears well-developed and well-nourished.  Non-toxic appearance. He does not have a sickly appearance. He does not appear ill.  HENT:  Head: Normocephalic and atraumatic.  Mouth/Throat: Oropharynx is clear and moist.  Eyes: Conjunctivae are normal.  Neck: Normal range of motion. Neck supple.  Cardiovascular: Normal rate.  An irregularly irregular rhythm present.  Pulmonary/Chest: Effort normal and breath sounds normal. No accessory muscle usage or stridor. No respiratory distress.  He has no wheezes. He has no rhonchi. He has no rales.  Abdominal: Soft. Bowel sounds are normal. He exhibits no distension. There is tenderness (mild) in the suprapubic area.  Genitourinary:  Genitourinary Comments: Foley catheter with yellow-clear UO.   Musculoskeletal: Normal range of motion.  Lymphadenopathy:    He has no cervical adenopathy.  Neurological: He is alert and oriented to person, place, and time.  Speech clear without dysarthria.  Skin: Skin is warm and dry.  Psychiatric: He has a normal mood and affect. His behavior is normal.    ED Treatments / Results  DIAGNOSTIC STUDIES: Oxygen Saturation is 96% on RA, normal by my interpretation.    COORDINATION OF CARE: 7:11 PM-Discussed treatment plan which includes [place] of Bladder and placement of new catheter with pt at bedside and pt agreed to plan.   Radiology No results found.  Procedures Procedures (including critical care time)  Medications Ordered in ED Medications  cephALEXin (KEFLEX) capsule 500 mg (not administered)     Initial Impression / Assessment and Plan / ED Course  I have reviewed the triage vital signs and the nursing notes.  Pertinent labs & imaging results that were available during my care of the patient were reviewed by me and considered in my medical decision making (see chart for details).  Clinical Course   Patient with urinary retention after urinary catheter change yesterday.  No fever, chills, N/V.  VSS, NAD. Mild suprapubic tenderness without rebound, guarding, or rigidity.  UO yellow-clear.  Bladder scan showed 479 mL.  Will exchange foley, check UA, I-stat chem 8, and PVR.   Foley removed and exchanged with good UO. Renal function at baseline.  UA shows large leukocytes, 6-30 WBCs, many bacteria.  Culture sent.  Home with Keflex.  Follow up urology.  Return precautions discussed.  Case has been discussed with and seen by Dr. Darl Householder who agrees with the above plan for discharge.    Final  Clinical Impressions(s) / ED Diagnoses   Final diagnoses:  Urinary retention  Complication of Foley catheter, initial encounter ([place])    New Prescriptions New Prescriptions   CEPHALEXIN (KEFLEX) 500 MG CAPSULE    Take 1 capsule (500 mg total) by mouth 2 (two) times daily.   I personally performed the services described in this documentation, which was scribed in  my presence. The recorded information has been reviewed and is accurate.    Vivien Rossetti 10/15/15 2159    Drenda Freeze, MD 10/18/15 2126

## 2015-10-18 LAB — URINE CULTURE: Culture: >=100000 — AB

## 2015-10-19 ENCOUNTER — Telehealth (HOSPITAL_COMMUNITY): Payer: Self-pay

## 2015-10-19 NOTE — Telephone Encounter (Signed)
Post ED Visit - Positive Culture Follow-up  Culture report reviewed by antimicrobial stewardship pharmacist:  []  Elenor Quinones, Pharm.D. []  Heide Guile, Pharm.D., BCPS []  Parks Neptune, Pharm.D. []  Alycia Rossetti, Pharm.D., BCPS []  [place], [place].D., BCPS, AAHIVP []  Legrand Como, Pharm.D., BCPS, AAHIVP []  Milus Glazier, Pharm.D. []  Stephens November, Pharm.D. Bonnee Quin, Pharm.D.  Positive urine culture, >/= 100,000 colonies -> E Coli & >/= 100,000 colonies Klebsiella Oxytoca Treated with Cephalexin, organism sensitive to the same and no further patient follow-up is required at this time.   Dortha Kern 10/19/2015, 11:35 AM

## 2015-10-31 ENCOUNTER — Other Ambulatory Visit: Payer: Self-pay | Admitting: Oncology

## 2015-10-31 DIAGNOSIS — C61 Malignant neoplasm of prostate: Secondary | ICD-10-CM

## 2015-10-31 DIAGNOSIS — C7951 Secondary malignant neoplasm of bone: Secondary | ICD-10-CM

## 2015-11-04 ENCOUNTER — Encounter (INDEPENDENT_AMBULATORY_CARE_PROVIDER_SITE_OTHER): Payer: Self-pay

## 2015-11-04 ENCOUNTER — Ambulatory Visit (INDEPENDENT_AMBULATORY_CARE_PROVIDER_SITE_OTHER): Payer: Medicare Other | Admitting: *Deleted

## 2015-11-04 DIAGNOSIS — I482 Chronic atrial fibrillation, unspecified: Secondary | ICD-10-CM

## 2015-11-04 DIAGNOSIS — I4891 Unspecified atrial fibrillation: Secondary | ICD-10-CM

## 2015-11-04 DIAGNOSIS — Z5181 Encounter for therapeutic drug level monitoring: Secondary | ICD-10-CM | POA: Diagnosis not present

## 2015-11-04 LAB — POCT INR: INR: 1.8

## 2015-11-17 IMAGING — NM NM BONE WHOLE BODY
2 series · 2 of 2 positions shown · non-contrast
Comparison: 08/07/2012.  MRI 04/22/2014 .

CLINICAL DATA: Prostate cancer.

EXAM:
NUCLEAR MEDICINE WHOLE BODY BONE SCAN
TECHNIQUE: Whole body anterior and posterior images were obtained approximately
3 hours after intravenous injection of radiopharmaceutical.
RADIOPHARMACEUTICALS:  26.6 mCi Fechnetium-SSm MDP IV

[Series 1: wbr_bone_40 whole body · 2.66mm/px · 1 of 1 slices shown (1 of 2)]
[im 1/1]
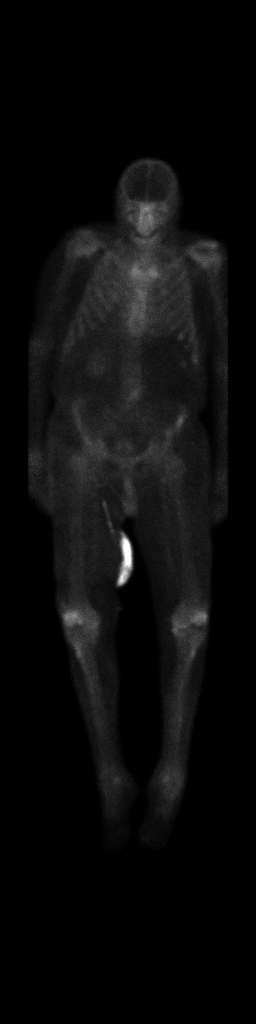

[Series 1: wbr_bone_40 whole body · 2.66mm/px · 1 of 1 slices shown (2 of 2)]
[im 1/1]
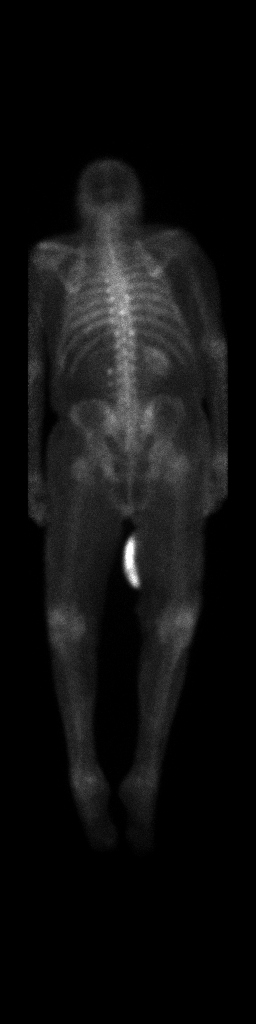

[2 of 2 positions shown; findings below may reference images not displayed]

FINDINGS: Left kidney not visualized consistent with previously identified
left renal atrophy. Right kidney is normally visualized. Bladder is
visualized. Minimal multifocal areas of increased activity noted in
the thoracic spine in this patient with known metastases. Minimal
increased activity noted in the left anterior upper and lower ribs.
These could represent tiny metastases.
IMPRESSION: 1. Nonvisualization left kidney consistent with known left renal
atrophy.
2. Minimal increased activity noted throughout the thoracic spine
suggesting slight progression of known metastases. Punctate areas of
minimal increase activity noted and left upper and lower anterior
ribs suggesting the possibility of metastatic disease.

## 2015-11-18 ENCOUNTER — Telehealth: Payer: Self-pay | Admitting: Oncology

## 2015-11-18 ENCOUNTER — Other Ambulatory Visit (HOSPITAL_BASED_OUTPATIENT_CLINIC_OR_DEPARTMENT_OTHER): Payer: Medicare Other

## 2015-11-18 ENCOUNTER — Ambulatory Visit (HOSPITAL_BASED_OUTPATIENT_CLINIC_OR_DEPARTMENT_OTHER): Payer: Medicare Other | Admitting: Oncology

## 2015-11-18 VITALS — BP 140/74 | HR 77 | Temp 97.5°F | Resp 16

## 2015-11-18 DIAGNOSIS — R531 Weakness: Secondary | ICD-10-CM

## 2015-11-18 DIAGNOSIS — C7951 Secondary malignant neoplasm of bone: Secondary | ICD-10-CM | POA: Diagnosis not present

## 2015-11-18 DIAGNOSIS — C61 Malignant neoplasm of prostate: Secondary | ICD-10-CM | POA: Diagnosis not present

## 2015-11-18 DIAGNOSIS — E291 Testicular hypofunction: Secondary | ICD-10-CM

## 2015-11-18 LAB — CBC WITH DIFFERENTIAL/PLATELET
BASO%: 0.5 % (ref 0.0–2.0)
Basophils Absolute: 0 10*3/uL (ref 0.0–0.1)
EOS%: 3.3 % (ref 0.0–7.0)
Eosinophils Absolute: 0.3 10*3/uL (ref 0.0–0.5)
HCT: 44.2 % (ref 38.4–49.9)
HEMOGLOBIN: 14.5 g/dL (ref 13.0–17.1)
LYMPH#: 1.6 10*3/uL (ref 0.9–3.3)
LYMPH%: 17.3 % (ref 14.0–49.0)
MCH: 31.4 pg (ref 27.2–33.4)
MCHC: 32.7 g/dL (ref 32.0–36.0)
MCV: 95.8 fL (ref 79.3–98.0)
MONO#: 0.8 10*3/uL (ref 0.1–0.9)
MONO%: 8.3 % (ref 0.0–14.0)
NEUT%: 70.6 % (ref 39.0–75.0)
NEUTROS ABS: 6.5 10*3/uL (ref 1.5–6.5)
PLATELETS: 183 10*3/uL (ref 140–400)
RBC: 4.62 10*6/uL (ref 4.20–5.82)
RDW: 15 % — AB (ref 11.0–14.6)
WBC: 9.1 10*3/uL (ref 4.0–10.3)

## 2015-11-18 LAB — COMPREHENSIVE METABOLIC PANEL
ALBUMIN: 3.1 g/dL — AB (ref 3.5–5.0)
ALK PHOS: 71 U/L (ref 40–150)
ALT: 12 U/L (ref 0–55)
ANION GAP: 12 meq/L — AB (ref 3–11)
AST: 14 U/L (ref 5–34)
BILIRUBIN TOTAL: 0.54 mg/dL (ref 0.20–1.20)
BUN: 43.9 mg/dL — AB (ref 7.0–26.0)
CO2: 26 meq/L (ref 22–29)
CREATININE: 1.4 mg/dL — AB (ref 0.7–1.3)
Calcium: 10.5 mg/dL — ABNORMAL HIGH (ref 8.4–10.4)
Chloride: 103 mEq/L (ref 98–109)
EGFR: 43 mL/min/{1.73_m2} — ABNORMAL LOW (ref >90)
GLUCOSE: 141 mg/dL — AB (ref 70–140)
Potassium: 4.2 mEq/L (ref 3.5–5.1)
Sodium: 141 mEq/L (ref 136–145)
TOTAL PROTEIN: 7.5 g/dL (ref 6.4–8.3)

## 2015-11-18 NOTE — Telephone Encounter (Signed)
Gv pt appt for 10/24.

## 2015-11-18 NOTE — Progress Notes (Signed)
Hematology and Oncology Follow Up Visit  Craig [name] [id number] 01-31-1925 82 y.o. 11/18/2015 11:39 AM No PCP Per PatientNo ref. provider found   Principle Diagnosis: 82 year old with Castration resistant prostate cancer with metastatic disease to the bone. He was initially diagnosed in 2011 PSA of 19 and presented with advanced disease.   Prior Therapy: He is status post combined androgen deprivation with Lupron and Casodex with an excellent response initially and had a PSA nadir down to 1.67. Most recently he developed progression of disease and a PSA up to 5.94 in September of 2014. He is status post SRS treatment to the L4 completed on 02/13/2014. He is S/P stereotactic body radiotherapy to the iliac bone treatment completed in 02/2014. He is status post radiation therapy to the right femur completed on 06/07/2014.  Current therapy: He is on Xtandi started on 11/15/2012. He was started on 160 mg initially but the dose was reduced to 80 mg in December of 2014. Treatment has been on hold for 2 months. Xtandi resumed at 40 mg daily starting on 06/12/2014.  He is on Lupron and Xgeva done at [facility].  .  Interim History: Craig [name] presents today for a followup visit with his wife. Since the last visit, he reports slight improvement in his movement and mobility. She is still not able to walk but it is able to stand and pivot. His wife is attending to activities of daily living and able to keep him at home for the time being. He denied any recent falls or syncope. He denied any pathological fractures.  He continues to be on Xtandi of the current dose without any further decline in his energy or performance status. He continues to have a indwelling Foley catheter that requires changes every month..    He does not report any headaches, blurry vision, syncope or seizures. He does not report any chest pain, palpitation orthopnea. Has not reported any cough or hemoptysis or  hematemesis. Does not report any nausea or vomiting or abdominal pain. Does not report any constipation or diarrhea. Does not report any hematochezia or melena. Does not report any skin rashes or lesions. Rest of his review of system is unremarkable.  Medications: I have reviewed the patient's current medications.  Current Outpatient Prescriptions  Medication Sig Dispense Refill  . acetaminophen (TYLENOL) 500 MG tablet Take 1,000 mg by mouth 2 (two) times daily. Takes two tablets in the morning and two tablets at night    . amLODipine (NORVASC) 10 MG tablet Take 1 tablet (10 mg total) by mouth daily. 90 tablet 3  . atorvastatin (LIPITOR) 10 MG tablet Take 1 tablet (10 mg total) by mouth daily. 30 tablet 11  . B Complex-C (B-COMPLEX WITH VITAMIN C) tablet Take 1 tablet by mouth daily.      . Denosumab (XGEVA [place]) Inject into the skin every 30 (thirty) days. Monthly. Last inj was 8-19- 2016. Wife not sure what dose it is    . docusate sodium (COLACE) 100 MG capsule Take 100 mg by mouth 2 (two) times daily.    . furosemide (LASIX) 40 MG tablet TAKE 1 TABLET BY MOUTH DAILY 90 tablet 2  . imiquimod (ALDARA) 5 % cream Apply 1 application topically 2 (two) times a week.     Marland Kitchen Leuprolide Acetate (LUPRON DEPOT IM) Inject 1 each into the muscle every 6 (six) months. Last inj was in March 2016    . losartan (COZAAR) 100 MG tablet Take 1  tablet (100 mg total) by mouth daily. 90 tablet 2  . multivitamin (THERAGRAN) per tablet Take 1 tablet by mouth daily. ( with B - Complex )    . Omega-3 Fatty Acids (FISH OIL PO) Take 1 capsule by mouth daily. ( Mega Red )    . oxybutynin (DITROPAN) 5 MG tablet     . ranitidine (ZANTAC) 150 MG tablet Take 150 mg by mouth at bedtime.    . tamsulosin (FLOMAX) 0.4 MG CAPS capsule Take 1 capsule (0.4 mg total) by mouth daily. 30 capsule 2  . warfarin (COUMADIN) 5 MG tablet TAKE AS DIRECTED BY COUMADIN CLINIC (Patient taking differently: TAKE AS DIRECTED BY COUMADIN CLINIC) 30  tablet 3  . XTANDI 40 MG capsule TAKE 1 CAPSULE BY MOUTH ONCE DAILY 30 capsule 0  . ZETIA 10 MG tablet TAKE 1 TABLET BY MOUTH ONCE DAILY 90 tablet 3   No current facility-administered medications for this visit.      Allergies:  Allergies  Allergen Reactions  . Pravachol Other (See Comments)    Muscle weakness  . Zocor [Simvastatin - High Dose] Other (See Comments)    Muscle weakness    Past Medical History, Surgical history, Social history, and Family History were reviewed and updated.   Physical Exam: Blood pressure 140/74, pulse 77, temperature 97.5 F (36.4 C), temperature source Oral, resp. rate 16, SpO2 98 %. ECOG: 2 General appearance: Elderly frail gentleman without distress. Head: Normocephalic, without obvious abnormality no oral ulcers or lesions. Neck: no adenopathy or masses. Lymph nodes: Cervical, supraclavicular, and axillary nodes normal. Heart:  Irregular rhythm without any murmurs or gallops. Lung:chest clear, no wheezing, rales, no dullness to percussion. Abdomin: soft, non-tender, without masses or organomegaly. No rebound or guarding. EXT: No cyanosis or edema noted. Neurological examination: No deficits noted.  Lab Results: Lab Results  Component Value Date   WBC 9.1 11/18/2015   HGB 14.5 11/18/2015   HCT 44.2 11/18/2015   MCV 95.8 11/18/2015   PLT 183 11/18/2015     Chemistry      Component Value Date/Time   NA 138 10/15/2015 2028   NA 140 10/14/2015 1154   K 4.1 10/15/2015 2028   K 4.1 10/14/2015 1154   CL 99 (L) 10/15/2015 2028   CO2 26 10/14/2015 1154   BUN 41 (H) 10/15/2015 2028   BUN 39.2 (H) 10/14/2015 1154   CREATININE 1.40 (H) 10/15/2015 2028   CREATININE 1.5 (H) 10/14/2015 1154      Component Value Date/Time   CALCIUM 10.4 10/14/2015 1154   ALKPHOS 64 10/14/2015 1154   AST 13 10/14/2015 1154   ALT 9 10/14/2015 1154   BILITOT 0.58 10/14/2015 1154      Results for Craig [name] (MRN [id number]) as of 11/18/2015  11:24  Ref. Range 07/22/2015 11:44 09/09/2015 12:15 10/14/2015 11:54  PSA Latest Ref Range: 0.0 - 4.0 ng/mL 10.6 (H) 13.0 (H) 12.9 (H)    Impression and Plan:  82 year old gentleman with the following issues:   1. Castration resistant prostate cancer with metastatic disease to the bone. He started on xtandi in September 2014 and have required dose reduction to 40 mg since that time.   His last PSA remains relatively stable without any major changes. His last bone scan on 10/10/2015 showed reasonably stable disease. The plan is to continue with the same treatment and he is different salvage therapy upon symptomatic progression.  2. Androgen depravation: He is to continue Lupron at this time.  He was given by Dr. Junious Silk at [facility].   3. Bone directed therapy: continues to be on Xgeva and have tolerated it well. I recommended continuing to receive this at [facility] as he is doing now.  4. Hypercalcemia: His calcium continues to be normal and will be repeated monthly basis.  5. Bony metastasis: He have received radiation therapy to the lumbar spine as well as the right femur. Radiation can be used in the future if he develops any other pain issues. No increased bone pain noted at this time.  6. Generalized weakness: Seems to have improved at this time which makes it unlikely related to his cancer.  7. Followup: in 4 weeks.  Craig Button, MD 9/26/201711:39 AM  Hematology and Oncology Follow Up Visit  Craig [name] [id number] [birth year] 82 y.o. 11/18/2015 11:39 AM No PCP Per PatientNo ref. provider found   Principle Diagnosis: 82 year old with Castration resistant prostate cancer with metastatic disease to the bone. He was initially diagnosed in 2011 PSA of 19 and presented with advanced disease.   Prior Therapy: He is status post combined androgen deprivation with Lupron and Casodex with an excellent response initially and had a PSA nadir down to 1.67. Most  recently he developed progression of disease and a PSA up to 5.94 in September of 2014. He is status post SRS treatment to the L4 completed on 02/13/2014. He is S/P stereotactic body radiotherapy to the iliac bone treatment completed in 02/2014. He is status post radiation therapy to the right femur completed on 06/07/2014.  Current therapy: He is on Xtandi started on 11/15/2012. He was started on 160 mg initially but the dose was reduced to 80 mg in December of 2014. Treatment has been on hold for 2 months. Xtandi resumed at 40 mg daily starting on 06/12/2014.  He is on Lupron and Xgeva done at [facility].  .  Interim History: Craig [name] presents today for a followup visit with his wife. Since the last visit, he reports improvement in his overall health and strength. His wife reports that she is able to get him into the car and had a bone scan last week and will have a catheter placement today.  He denied any recent falls or syncope. He denied any pathological fractures.  He continues to be on Xtandi of the current dose without any further decline in his energy or performance status. He denied any dyspepsia or GI symptoms.    He does not report any headaches, blurry vision, syncope or seizures. He does not report any chest pain, palpitation orthopnea. Has not reported any cough or hemoptysis or hematemesis. Does not report any nausea or vomiting or abdominal pain. Does not report any constipation or diarrhea. Does not report any hematochezia or melena. Does not report any skin rashes or lesions. Rest of his review of system is unremarkable.  Medications: I have reviewed the patient's current medications.  Current Outpatient Prescriptions  Medication Sig Dispense Refill  . acetaminophen (TYLENOL) 500 MG tablet Take 1,000 mg by mouth 2 (two) times daily. Takes two tablets in the morning and two tablets at night    . amLODipine (NORVASC) 10 MG tablet Take 1 tablet (10 mg total) by mouth  daily. 90 tablet 3  . atorvastatin (LIPITOR) 10 MG tablet Take 1 tablet (10 mg total) by mouth daily. 30 tablet 11  . B Complex-C (B-COMPLEX WITH VITAMIN C) tablet Take 1 tablet by mouth daily.      Marland Kitchen  Denosumab (XGEVA [place]) Inject into the skin every 30 (thirty) days. Monthly. Last inj was 8-19- 2016. Wife not sure what dose it is    . docusate sodium (COLACE) 100 MG capsule Take 100 mg by mouth 2 (two) times daily.    . furosemide (LASIX) 40 MG tablet TAKE 1 TABLET BY MOUTH DAILY 90 tablet 2  . imiquimod (ALDARA) 5 % cream Apply 1 application topically 2 (two) times a week.     Marland Kitchen Leuprolide Acetate (LUPRON DEPOT IM) Inject 1 each into the muscle every 6 (six) months. Last inj was in March 2016    . losartan (COZAAR) 100 MG tablet Take 1 tablet (100 mg total) by mouth daily. 90 tablet 2  . multivitamin (THERAGRAN) per tablet Take 1 tablet by mouth daily. ( with B - Complex )    . Omega-3 Fatty Acids (FISH OIL PO) Take 1 capsule by mouth daily. ( Mega Red )    . oxybutynin (DITROPAN) 5 MG tablet     . ranitidine (ZANTAC) 150 MG tablet Take 150 mg by mouth at bedtime.    . tamsulosin (FLOMAX) 0.4 MG CAPS capsule Take 1 capsule (0.4 mg total) by mouth daily. 30 capsule 2  . warfarin (COUMADIN) 5 MG tablet TAKE AS DIRECTED BY COUMADIN CLINIC (Patient taking differently: TAKE AS DIRECTED BY COUMADIN CLINIC) 30 tablet 3  . XTANDI 40 MG capsule TAKE 1 CAPSULE BY MOUTH ONCE DAILY 30 capsule 0  . ZETIA 10 MG tablet TAKE 1 TABLET BY MOUTH ONCE DAILY 90 tablet 3   No current facility-administered medications for this visit.      Allergies:  Allergies  Allergen Reactions  . Pravachol Other (See Comments)    Muscle weakness  . Zocor [Simvastatin - High Dose] Other (See Comments)    Muscle weakness    Past Medical History, Surgical history, Social history, and Family History were reviewed and updated.   Physical Exam: Blood pressure 140/74, pulse 77, temperature 97.5 F (36.4 C), temperature  source Oral, resp. rate 16, SpO2 98 %. ECOG: 2 General appearance: Well-appearing gentleman sitting in a wheelchair without distress. Head: Normocephalic, without obvious abnormality no oral thrush noted. Neck: no adenopathy or masses. Lymph nodes: Cervical, supraclavicular, and axillary nodes normal. Heart:  Irregular rhythm without any murmurs or gallops. Lung:chest clear, no wheezing, rales, no dullness to percussion. Abdomin: soft, non-tender, without masses or organomegaly. No shifting dullness or ascites. EXT: No cyanosis or edema noted. Neurological examination: No deficits noted.  Lab Results: Lab Results  Component Value Date   WBC 9.1 11/18/2015   HGB 14.5 11/18/2015   HCT 44.2 11/18/2015   MCV 95.8 11/18/2015   PLT 183 11/18/2015     Chemistry      Component Value Date/Time   NA 138 10/15/2015 2028   NA 140 10/14/2015 1154   K 4.1 10/15/2015 2028   K 4.1 10/14/2015 1154   CL 99 (L) 10/15/2015 2028   CO2 26 10/14/2015 1154   BUN 41 (H) 10/15/2015 2028   BUN 39.2 (H) 10/14/2015 1154   CREATININE 1.40 (H) 10/15/2015 2028   CREATININE 1.5 (H) 10/14/2015 1154      Component Value Date/Time   CALCIUM 10.4 10/14/2015 1154   ALKPHOS 64 10/14/2015 1154   AST 13 10/14/2015 1154   ALT 9 10/14/2015 1154   BILITOT 0.58 10/14/2015 1154      Results for CRISTEN, LEBEAU (MRN [id number]) as of 09/09/2015 12:30  Ref. Range 04/23/2015  14:22 06/04/2015 14:15 07/22/2015 11:44  PSA Latest Ref Range: 0.0-4.0 ng/mL 7.7 (H) 8.5 (H) 10.6 (H)    Results for WWLLIAM, ABUKAR (MRN [id number]) as of 10/14/2015 12:14  Ref. Range 06/04/2015 14:15 07/22/2015 11:44 09/09/2015 12:15  PSA Latest Ref Range: 0.0 - 4.0 ng/mL 8.5 (H) 10.6 (H) 13.0 (H)   CLINICAL DATA:  Metastatic prostate cancer to bone, BILATERAL knee pain, occasional low back pain normal fell 4 years ago with question fractured LEFT ribs  EXAM: NUCLEAR MEDICINE WHOLE BODY BONE SCAN  TECHNIQUE: Whole body anterior  and posterior images were obtained approximately 3 hours after intravenous injection of radiopharmaceutical.  RADIOPHARMACEUTICALS:  22 mCi Technetium-38m MDP IV  COMPARISON:  10/11/2014, 08/07/2012  Radiographic correlation: None recent  FINDINGS: Mild uptake at a posterolateral lower RIGHT rib approximately 8th, stable.  Increased tracer localization posterior LEFT 10th rib and anterolateral LEFT 9th rib, stable.  Stable subtle increased tracer localization at anterior LEFT iliac bone.  Uptake at the shoulders, sternoclavicular joints, elbows, knees, and feet, typically degenerative.  Catheter with urinary reservoir at medial RIGHT thigh.  No additional abnormal foci of increased osseous tracer accumulation identified.  No LEFT renal tracer visualized, stable.  Otherwise expected urinary tract and soft tissue distribution of tracer.  IMPRESSION: Stable foci of abnormal osseous tracer accumulation and multiple ribs.  Stable increased osseous accumulation at anterior LEFT iliac wing.  No new focal scintigraphic abnormalities identified.   Impression and Plan:  82 year old gentleman with the following issues:   1. Castration resistant prostate cancer with metastatic disease to the bone. He started on xtandi in September 2014 and have required dose reduction to 40 mg since that time.   His last PSA did show increased uptake 13 and his bone scan was personally reviewed today. His bone scan was discussed which showed stable disease without any new areas of concern. Despite the rise in his PSA, his clinical status remain stable and we have elected to keep him on the current dose and regimen. Different salvage therapy will be utilized upon symptomatic progression. Zytiga would be the next treatment of choice.  2. Androgen depravation: He is to continue Lupron at this time. He was given by Dr. Junious Silk at [facility].   3. Bone directed therapy:  continues to be on Xgeva and have tolerated it well. I recommended continuing to receive this at [facility] as he is doing now.  4. Hypercalcemia: His calcium continues to be normal based on the most recent laboratory testing in July 2017.  5. Bony metastasis: He have received radiation therapy to the lumbar spine as well as the right femur. Radiation can be used in the future if he develops any other pain issues. Bone scan did not show any progression of disease or no new lesions.  6. Generalized weakness: Seems to have improved at this time which makes it unlikely related to his cancer.  7. Followup: in 4 weeks.  [facility] MD 9/26/201711:39 AM

## 2015-11-19 LAB — PSA: Prostate Specific Ag, Serum: 15.7 ng/mL — ABNORMAL HIGH (ref 0.0–4.0)

## 2015-11-24 ENCOUNTER — Other Ambulatory Visit: Payer: Self-pay

## 2015-11-24 MED ORDER — FUROSEMIDE 40 MG PO TABS: 40.0000 mg | ORAL_TABLET | Freq: Every day | ORAL | 2 refills | Status: DC

## 2015-11-28 ENCOUNTER — Other Ambulatory Visit: Payer: Self-pay | Admitting: Oncology

## 2015-11-28 DIAGNOSIS — C61 Malignant neoplasm of prostate: Secondary | ICD-10-CM

## 2015-11-28 DIAGNOSIS — C7951 Secondary malignant neoplasm of bone: Secondary | ICD-10-CM

## 2015-12-01 ENCOUNTER — Ambulatory Visit (INDEPENDENT_AMBULATORY_CARE_PROVIDER_SITE_OTHER): Payer: Medicare Other | Admitting: Family Medicine

## 2015-12-01 ENCOUNTER — Encounter: Payer: Self-pay | Admitting: Family Medicine

## 2015-12-01 VITALS — BP 149/78 | HR 76 | Temp 97.6°F

## 2015-12-01 DIAGNOSIS — H9190 Unspecified hearing loss, unspecified ear: Secondary | ICD-10-CM | POA: Insufficient documentation

## 2015-12-01 DIAGNOSIS — I10 Essential (primary) hypertension: Secondary | ICD-10-CM

## 2015-12-01 DIAGNOSIS — N183 Chronic kidney disease, stage 3 unspecified: Secondary | ICD-10-CM

## 2015-12-01 DIAGNOSIS — Z8673 Personal history of transient ischemic attack (TIA), and cerebral infarction without residual deficits: Secondary | ICD-10-CM | POA: Diagnosis not present

## 2015-12-01 DIAGNOSIS — I482 Chronic atrial fibrillation, unspecified: Secondary | ICD-10-CM

## 2015-12-01 DIAGNOSIS — C419 Malignant neoplasm of bone and articular cartilage, unspecified: Secondary | ICD-10-CM | POA: Insufficient documentation

## 2015-12-01 DIAGNOSIS — Z9189 Other specified personal risk factors, not elsewhere classified: Secondary | ICD-10-CM

## 2015-12-01 DIAGNOSIS — N289 Disorder of kidney and ureter, unspecified: Secondary | ICD-10-CM

## 2015-12-01 DIAGNOSIS — C7951 Secondary malignant neoplasm of bone: Secondary | ICD-10-CM

## 2015-12-01 DIAGNOSIS — Z23 Encounter for immunization: Secondary | ICD-10-CM | POA: Diagnosis not present

## 2015-12-01 DIAGNOSIS — C412 Malignant neoplasm of vertebral column: Secondary | ICD-10-CM

## 2015-12-01 DIAGNOSIS — E7800 Pure hypercholesterolemia, unspecified: Secondary | ICD-10-CM

## 2015-12-01 DIAGNOSIS — Z7901 Long term (current) use of anticoagulants: Secondary | ICD-10-CM | POA: Insufficient documentation

## 2015-12-01 DIAGNOSIS — C61 Malignant neoplasm of prostate: Secondary | ICD-10-CM

## 2015-12-01 NOTE — Assessment & Plan Note (Signed)
affected speak and equilibrium in 2002- bleed into brain.

## 2015-12-01 NOTE — Patient Instructions (Addendum)
We will be sending you to [facility] for the audiology evaluation. You'll be getting a call from them to set this up.   Dehydration Dehydration is when you lose more fluids from the body than you take in. Vital organs such as the kidneys, brain, and heart cannot function without a proper amount of fluids and salt. Any loss of fluids from the body can cause dehydration.  Older adults are at a higher risk of dehydration than younger adults. As we age, our bodies are less able to conserve water and do not respond to temperature changes as well. Also, older adults do not become thirsty as easily or quickly. Because of this, older adults often do not realize they need to increase fluids to avoid dehydration.  CAUSES   Vomiting.  Diarrhea.  Excessive sweating.  Excessive urination.  Fever.  Certain medicines, such as blood pressure medicines called diuretics.  Poorly controlled blood sugars. SIGNS AND SYMPTOMS  Mild dehydration:  Thirst.  Dry lips.  Slightly dry mouth. Moderate dehydration:  Very dry mouth.  Sunken eyes.  Skin does not bounce back quickly when lightly pinched and released.  Dark urine and decreased urine production.  Decreased tear production.  Headache. Severe dehydration:  Very dry mouth.  Extreme thirst.  Rapid, weak pulse (more than 100 beats per minute at rest).  Cold hands and feet.  Not able to sweat in spite of heat.  Rapid breathing.  Blue lips.  Confusion and lethargy.  Difficulty being awakened.  Minimal urine production.  No tears. DIAGNOSIS  Your health care provider will diagnose dehydration based on your symptoms and your exam. Blood and urine tests will help confirm the diagnosis. The diagnostic evaluation should also identify the cause of dehydration. TREATMENT  Treatment of mild or moderate dehydration can often be done at home by increasing the amount of fluids that you drink. It is best to drink small amounts of  fluid more often. Drinking too much at one time can make vomiting worse. Severe dehydration needs to be treated at the hospital. You may be given IV fluids that contain water and electrolytes. HOME CARE INSTRUCTIONS   Ask your health care provider about specific rehydration instructions.  Drink enough fluids to keep your urine clear or pale yellow.  Drink small amounts frequently if you have nausea and vomiting.  Eat as you normally do.  Avoid:  Foods or drinks high in sugar.  Carbonated drinks.  Juice.  Extremely hot or cold fluids.  Drinks with caffeine.  Fatty, greasy foods.  Alcohol.  Tobacco.  Overeating.  Gelatin desserts.  Wash your hands well to avoid spreading bacteria and viruses.  Only take over-the-counter or prescription medicines for pain, discomfort, or fever as directed by your health care provider.  Ask your health care provider if you should continue all prescribed and over-the-counter medicines.  Keep all follow-up appointments with your health care provider. SEEK MEDICAL CARE IF:  You have abdominal pain, and it increases or stays in one area (localizes).  You have a rash, stiff neck, or severe headache.  You are irritable, sleepy, or difficult to awaken.  You are weak, dizzy, or extremely thirsty.  You have a fever. SEEK IMMEDIATE MEDICAL CARE IF:   You are unable to keep fluids down, or you get worse despite treatment.  You have frequent episodes of vomiting or diarrhea.  You have blood or green matter (bile) in your vomit.  You have blood in your stool, or your stool  looks black and tarry.  You have not urinated in 6-8 hours, or you have only urinated a small amount of very dark urine.  You faint. MAKE SURE YOU:   Understand these instructions.  Will watch your condition.  Will get help right away if you are not doing well or get worse.   This information is not intended to replace advice given to you by your health care  provider. Make sure you discuss any questions you have with your health care provider.   Document Released: 05/01/2003 Document Revised: 02/13/2013 Document Reviewed: 10/16/2012 Elsevier Interactive Patient Education [employer].

## 2015-12-01 NOTE — Progress Notes (Signed)
New patient office visit note:  Impression and Recommendations:    1. Hearing difficulty, unspecified laterality   2. Hypercholesterolemia   3. Chronic kidney disease (CKD), stage III (moderate)   4. History of recurrent TIAs   5. Malignant neoplasm of vertebral column, excluding sacrum and coccyx ([place])   6. Chronic atrial fibrillation (HCC)   7. Renal insufficiency   8. Essential hypertension   9. At high risk for complication of immobility   10. Prostate cancer metastatic to bone ([place])   11. Need for prophylactic vaccination and inoculation against influenza    -  Referral for audiology evaluation placed after discussion  - We will hold off and wait to see what his other specialists draw for labs in the very near future as he has upcoming appointments, two within the next 30 days.  They prefer to wait to order  -I discussed he will need TSH, vitamin D, A1c among other labs from my standpoint  - Continue to monitor blood pressures at home; mgt per Cards.   Is less than 150/90  - Encouraged to drink more water  - Increase mobility and chair exercises as much as possible.  - Patient will follow up with his multiple medical specialists as planned and be treated for his conditions by them.  Stroke: Left-sided weakness  affected speech and equilibrium in 2002- bleed into brain.  Decreased ambulation at that time which has gotten progressively worse especially in the past 2 years.  They will continue to have rehabilitation specialist come to the house and work with patient on chair exercises to strengthen all 4 extremities etc.    Orders Placed This Encounter  Procedures  . Flu vaccine HIGH DOSE PF (Fluzone High dose)  . Ambulatory referral to ENT    New Prescriptions   No medications on file    Modified Medications   No medications on file    Discontinued Medications   No medications on file    Return in about 3 months (around 03/02/2016) for Follow-up of  current medical issues, will need bld wrk.  The patient was counseled, risk factors were discussed, anticipatory guidance given.  Gross side effects, risk and benefits, and alternatives of medications discussed with patient.  Patient is aware that all medications have potential side effects and we are unable to predict every side effect or drug-drug interaction that may occur.  Expresses verbal understanding and consents to current therapy plan and treatment regimen.  Please see AVS handed out to patient at the end of our visit for further patient instructions/ counseling done pertaining to today's office visit.    Note: This document was prepared using Dragon voice recognition software and may include unintentional dictation errors.  ----------------------------------------------------------------------------------------------------------------------    Subjective:    Chief Complaint  Patient presents with  . Establish Care    HPI: Craig [name]. is a pleasant 82 y.o. male who presents to [place] [facility] today to review their medical history with me and establish care.   I asked the patient to review their chronic problem list with me to ensure everything was updated and accurate.     - He is here with his wife today who is a very good historian.  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  They did  not tell me how many children and they had but did make Note of one of their daughters who is a Chief Executive Officer.  Patient primarily tells me he is seeing me because his retired cardiologist who was acting as his primary care rovider can no to him.     Dr Mare Ferrari cards now since retired- and "he used to take care of all primary care needs in addition to cards stuff--->  Now I need someone else- that's why I am seeking a primary care physician. "        Prostate CA metastases 3/11 - into spine bone, pelvis, hip and R femur--> screening by [facility].    PSA cont to rise 15.7.     Urology- has indwelling catheter. Can have frequent catheter related infections. Treated regularly by Dr Junious Silk   Has tried rehab and home PT for strengthening- little help. Patient is mostly wheelchair bound due to his prostate cancer that metastasized to his spine, hip, femur and has rendered him very weak and unsteady.   He also has a history of recurrent TIAs which also made him more and more weak/ Unsteady on his feet.   He goes to the Coumadin clinic regularly and is on chronic anticoagulation with a goal of INR- 1.8 and 2.2.  He sees multiple specialists:   Patient Care Team    Relationship Specialty Notifications Start End  Mellody Dance, DO PCP - General Family Medicine  12/01/15   Jettie Booze, MD Consulting Physician Cardiology  12/01/15   Garald Balding, MD Consulting Physician Orthopedic Surgery  12/01/15   Amy Martinique, MD Consulting Physician Dermatology  12/01/15   Wyatt Portela, MD Consulting Physician Oncology  12/01/15    Comment: prostate CA-   Festus Aloe, MD Consulting Physician Urology  12/01/15   Calvert Cantor, MD Consulting Physician Ophthalmology  12/01/15   Marcial Pacas, MD Consulting Physician Neurology  12/01/15      Wt Readings from Last 3 Encounters:  07/15/15 200 lb (90.7 kg)  04/14/15 200 lb (90.7 kg)  02/07/15 200 lb (90.7 kg)   BP Readings from Last 3 Encounters:  12/01/15 (!) 149/78  11/18/15 140/74  10/15/15 161/81   Pulse Readings from Last 3 Encounters:  12/01/15 76  11/18/15 77  10/15/15 97   BMI Readings from Last 3 Encounters:  07/15/15 27.12 kg/m  04/14/15 27.12 kg/m  02/07/15 25.33 kg/m    Patient Active Problem List   Diagnosis Date Noted  . Bone cancer ([place]) 12/01/2015  . History of recurrent TIAs 12/01/2015  . Chronic anticoagulation 12/01/2015  . Hearing difficulty 12/01/2015   . At high risk for complication of immobility 12/01/2015  . Aortic stenosis 07/15/2015  . Stroke ([place])   . Hemispheric carotid artery syndrome   . Cardiomyopathy, ischemic   . Stroke-like symptoms 12/31/2014  . Catheter-associated urinary tract infection ([place]) 12/31/2014  . Hemoptysis 07/12/2014  . Hypertensive urgency 07/12/2014  . Pulmonary embolism ([place])   . Cough with hemoptysis   . Pulmonary embolus ([place]) 07/11/2014  . Fever 04/22/2014  . Dysarthria 04/21/2014  . CVA (cerebral infarction) 04/19/2014  . Prostate cancer, primary, with metastasis from prostate to other site [facility]) 04/19/2014  . Chronic back pain 04/19/2014  . Expressive aphasia   . Bone metastasis ([place]) 02/12/2014  . Chronic diastolic heart failure ([place]) 02/11/2014  . Hypertensive heart disease 02/11/2014  . Essential hypertension 02/11/2014  . History of stroke 02/11/2014  . AAA (abdominal aortic aneurysm) ([place]) 02/11/2014  .  Hypokalemia 02/11/2014  . Back pain 02/09/2014  . Fluid overload 02/08/2014  . Left-sided weakness 02/08/2014  . Encounter for therapeutic drug monitoring 05/14/2013  . Chronic kidney disease (CKD), stage III (moderate) 09/27/2012  . Elevated brain natriuretic peptide (BNP) level 09/27/2012  . Hypercalcemia 09/27/2012  . Weakness 09/27/2012  . Epistaxis 01/08/2011  . Cerebellar stroke syndrome 07/27/2010  . Hypercholesterolemia 07/27/2010  . Prostate cancer metastatic to bone ([place]) 07/27/2010  . Benign hypertensive heart disease without heart failure 07/27/2010  . Renal insufficiency 07/27/2010  . Chronic atrial fibrillation ([place]) 05/25/2010     Past Medical History:  Diagnosis Date  . Atrial fibrillation ([place])   . Chronic back pain   . GERD (gastroesophageal reflux disease)   . H/O hiatal hernia   . History of epistaxis 07/19/2002  . Hyperlipidemia   . Hypertension   . Hypertensive heart disease   . Hypokalemia   . ICH (intracerebral hemorrhage) ([place]) ~ 1999   after  TPA/notes 09/27/2012  . Melanoma ([place])    "top of my head" (09/27/2012)  . Migraines    "migraines without headaches years ago" (09/27/2012)  . Osteoarthritis of both feet   . PAT (paroxysmal atrial tachycardia) ([place])   . Personal history of long-term (current) use of anticoagulants   . Pneumonia    "once; several years ago" (09/27/2012)  . Prostate cancer, primary, with metastasis from prostate to other site [facility])    on Lupron injections per GU  . S/P radiation therapy 02/13/14-02/27/14   SRS 5/5 spine completed 02/27/14  . S/P radiation therapy 05/27/14-05/31/14   right femur 20Gy/57fx  . Stroke [facility]) ~ 1999   ischemic / right cerebellar/posterior inferior cerebellar artery / right pos infarct     Past Surgical History:  Procedure Laterality Date  . CATARACT EXTRACTION W/ INTRAOCULAR LENS  IMPLANT, BILATERAL Bilateral ~ 2011  . MELANOMA EXCISION  2010   "pre-melanoma on top of head; did skin graft from left thigh to cover" (09/27/2012)  . NASAL SINUS SURGERY  1998  . SKIN GRAFT Right 2010  . TONSILLECTOMY  ~ 1935     Family History  Problem Relation Age of Onset  . Heart failure Mother   . Heart attack Father      History  Drug Use No    History  Alcohol Use No    Comment: 09/27/2012 "glass of wine or 2/month"    History  Smoking Status  . Former Smoker  . Years: 0.50  . Types: Cigarettes  . Quit date: 02/22/1953  Smokeless Tobacco  . Never Used    Patient's Medications  New Prescriptions   No medications on file  Previous Medications   ACETAMINOPHEN (TYLENOL) 500 MG TABLET    Take 1,000 mg by mouth 2 (two) times daily. Takes two tablets in the morning and two tablets at night   AMLODIPINE (NORVASC) 10 MG TABLET    Take 1 tablet (10 mg total) by mouth daily.   ATORVASTATIN (LIPITOR) 10 MG TABLET    Take 1 tablet (10 mg total) by mouth daily.   B COMPLEX-C (B-COMPLEX WITH VITAMIN C) TABLET    Take 1 tablet by mouth daily.     DENOSUMAB (XGEVA [place])    Inject into the skin  every 30 (thirty) days. Monthly. Last inj was 8-19- 2016. Wife not sure what dose it is   DOCUSATE SODIUM (COLACE) 100 MG CAPSULE    Take 100 mg by mouth 2 (two) times daily.   FUROSEMIDE (  LASIX) 40 MG TABLET    Take 1 tablet (40 mg total) by mouth daily.   IMIQUIMOD (ALDARA) 5 % CREAM    Apply 1 application topically 2 (two) times a week.    LEUPROLIDE ACETATE (LUPRON DEPOT IM)    Inject 1 each into the muscle every 6 (six) months. Last inj was in March 2016   LOSARTAN (COZAAR) 100 MG TABLET    Take 1 tablet (100 mg total) by mouth daily.   MULTIVITAMIN (THERAGRAN) PER TABLET    Take 1 tablet by mouth daily. ( with B - Complex )   OMEGA-3 FATTY ACIDS (FISH OIL PO)    Take 1 capsule by mouth daily. ( Mega Red )   OXYBUTYNIN (DITROPAN) 5 MG TABLET       RANITIDINE (ZANTAC) 150 MG TABLET    Take 150 mg by mouth at bedtime.   TAMSULOSIN (FLOMAX) 0.4 MG CAPS CAPSULE    Take 1 capsule (0.4 mg total) by mouth daily.   WARFARIN (COUMADIN) 5 MG TABLET    TAKE AS DIRECTED BY COUMADIN CLINIC   XTANDI 40 MG CAPSULE    TAKE 1 CAPSULE BY MOUTH ONCE DAILY   ZETIA 10 MG TABLET    TAKE 1 TABLET BY MOUTH ONCE DAILY  Modified Medications   No medications on file  Discontinued Medications   No medications on file    Allergies: Pravachol and Zocor [simvastatin - high dose]  Review of Systems  Constitutional: Negative.  Negative for chills, diaphoresis, fever, malaise/fatigue and weight loss.  HENT: Positive for hearing loss. Negative for congestion, sore throat and tinnitus.   Eyes: Negative for blurred vision, double vision and photophobia.       Change in vision  Respiratory: Positive for wheezing. Negative for cough.   Cardiovascular: Negative.  Negative for chest pain and palpitations.  Gastrointestinal: Negative.  Negative for blood in stool, diarrhea, nausea and vomiting.  Genitourinary: Negative.  Negative for dysuria, frequency and urgency.  Musculoskeletal: Positive for joint pain. Negative for  myalgias.  Skin: Negative.  Negative for itching and rash.  Neurological: Negative.  Negative for dizziness, focal weakness, weakness and headaches.  Endo/Heme/Allergies: Positive for environmental allergies. Negative for polydipsia. Does not bruise/bleed easily.  Psychiatric/Behavioral: Negative.  Negative for depression and memory loss. The patient is not nervous/anxious and does not have insomnia.      Objective:   Blood pressure (!) 149/78, pulse 76, temperature 97.6 F (36.4 C), temperature source Oral.  There is no height or weight on file to calculate BMI.- WHEELCHAIR BOUND  General: Well Developed, well nourished, and in no acute distress.  Neuro: Alert and oriented x3, extra-ocular muscles intact, sensation grossly intact.  HEENT: Normocephalic, atraumatic, pupils equal round reactive to light, neck supple, no carotid bruits no JVD Skin: no gross suspicious lesions or rashes  Cardiac: Regular rate and rhythm, no murmurs rubs or gallops.  Respiratory: Essentially clear to auscultation bilaterally- EXCEPT FOR SLIGHT CRACKLES IN THE BASES. Not using accessory muscles, speaking in full sentences.  Abdominal: Soft, not grossly distended Musculoskeletal: Ambulates w/o diff, FROM * 4 ext.  Vasc: less 2 sec cap RF, warm and pink, bilateral pitting edema, ompression stocknt Psych:  No HI/SI, judgement and insight good, Euthymic mood. Full Affect.

## 2015-12-02 ENCOUNTER — Ambulatory Visit (INDEPENDENT_AMBULATORY_CARE_PROVIDER_SITE_OTHER): Payer: Medicare Other | Admitting: *Deleted

## 2015-12-02 DIAGNOSIS — Z5181 Encounter for therapeutic drug level monitoring: Secondary | ICD-10-CM | POA: Diagnosis not present

## 2015-12-02 DIAGNOSIS — I4891 Unspecified atrial fibrillation: Secondary | ICD-10-CM

## 2015-12-02 DIAGNOSIS — I482 Chronic atrial fibrillation, unspecified: Secondary | ICD-10-CM

## 2015-12-02 LAB — POCT INR: INR: 1.7

## 2015-12-11 ENCOUNTER — Telehealth: Payer: Self-pay | Admitting: Family Medicine

## 2015-12-11 NOTE — Telephone Encounter (Signed)
Patient needs his pharmacy changed to [facility], they do not use [facility]

## 2015-12-11 NOTE — Telephone Encounter (Signed)
DONE

## 2015-12-13 ENCOUNTER — Other Ambulatory Visit: Payer: Self-pay | Admitting: Interventional Cardiology

## 2015-12-15 DIAGNOSIS — H903 Sensorineural hearing loss, bilateral: Secondary | ICD-10-CM | POA: Insufficient documentation

## 2015-12-16 ENCOUNTER — Ambulatory Visit (HOSPITAL_BASED_OUTPATIENT_CLINIC_OR_DEPARTMENT_OTHER): Payer: Medicare Other | Admitting: Oncology

## 2015-12-16 ENCOUNTER — Other Ambulatory Visit (HOSPITAL_BASED_OUTPATIENT_CLINIC_OR_DEPARTMENT_OTHER): Payer: Medicare Other

## 2015-12-16 ENCOUNTER — Telehealth: Payer: Self-pay | Admitting: Oncology

## 2015-12-16 VITALS — BP 155/49 | HR 67 | Temp 97.7°F | Resp 16 | Ht 72.0 in

## 2015-12-16 DIAGNOSIS — E291 Testicular hypofunction: Secondary | ICD-10-CM

## 2015-12-16 DIAGNOSIS — C61 Malignant neoplasm of prostate: Secondary | ICD-10-CM

## 2015-12-16 DIAGNOSIS — C7951 Secondary malignant neoplasm of bone: Secondary | ICD-10-CM

## 2015-12-16 DIAGNOSIS — R531 Weakness: Secondary | ICD-10-CM | POA: Diagnosis not present

## 2015-12-16 LAB — CBC WITH DIFFERENTIAL/PLATELET
BASO%: 0.4 % (ref 0.0–2.0)
Basophils Absolute: 0 10*3/uL (ref 0.0–0.1)
EOS%: 3.2 % (ref 0.0–7.0)
Eosinophils Absolute: 0.3 10*3/uL (ref 0.0–0.5)
HEMATOCRIT: 43.1 % (ref 38.4–49.9)
HEMOGLOBIN: 13.8 g/dL (ref 13.0–17.1)
LYMPH#: 1.7 10*3/uL (ref 0.9–3.3)
LYMPH%: 18.9 % (ref 14.0–49.0)
MCH: 30.6 pg (ref 27.2–33.4)
MCHC: 32.1 g/dL (ref 32.0–36.0)
MCV: 95.4 fL (ref 79.3–98.0)
MONO#: 0.7 10*3/uL (ref 0.1–0.9)
MONO%: 8 % (ref 0.0–14.0)
NEUT#: 6.3 10*3/uL (ref 1.5–6.5)
NEUT%: 69.5 % (ref 39.0–75.0)
PLATELETS: 174 10*3/uL (ref 140–400)
RBC: 4.51 10*6/uL (ref 4.20–5.82)
RDW: 14.9 % — AB (ref 11.0–14.6)
WBC: 9.1 10*3/uL (ref 4.0–10.3)

## 2015-12-16 LAB — COMPREHENSIVE METABOLIC PANEL
ALBUMIN: 3 g/dL — AB (ref 3.5–5.0)
ALK PHOS: 69 U/L (ref 40–150)
ALT: 11 U/L (ref 0–55)
AST: 14 U/L (ref 5–34)
Anion Gap: 8 mEq/L (ref 3–11)
BILIRUBIN TOTAL: 0.49 mg/dL (ref 0.20–1.20)
BUN: 45.1 mg/dL — AB (ref 7.0–26.0)
CALCIUM: 10.4 mg/dL (ref 8.4–10.4)
CO2: 27 mEq/L (ref 22–29)
CREATININE: 1.3 mg/dL (ref 0.7–1.3)
Chloride: 106 mEq/L (ref 98–109)
EGFR: 47 mL/min/{1.73_m2} — ABNORMAL LOW (ref >90)
Glucose: 139 mg/dl (ref 70–140)
Potassium: 4 mEq/L (ref 3.5–5.1)
Sodium: 141 mEq/L (ref 136–145)
TOTAL PROTEIN: 7.2 g/dL (ref 6.4–8.3)

## 2015-12-16 NOTE — Telephone Encounter (Signed)
AVS report and appointment schedule given to patient, per 12/16/15 los. °

## 2015-12-16 NOTE — Progress Notes (Signed)
Hematology and Oncology Follow Up Visit  Jare Bigos [id number] 1924/07/13 82 y.o. 12/16/2015 12:22 PM Mellody Dance, DONo ref. provider found   Principle Diagnosis: 82 year old with Castration resistant prostate cancer with metastatic disease to the bone. He was initially diagnosed in 2011 PSA of 19 and presented with advanced disease.   Prior Therapy: He is status post combined androgen deprivation with Lupron and Casodex with an excellent response initially and had a PSA nadir down to 1.67. Most recently he developed progression of disease and a PSA up to 5.94 in September of 2014. He is status post SRS treatment to the L4 completed on 02/13/2014. He is S/P stereotactic body radiotherapy to the iliac bone treatment completed in 02/2014. He is status post radiation therapy to the right femur completed on 06/07/2014.  Current therapy: He is on Xtandi started on 11/15/2012. He was started on 160 mg initially but the dose was reduced to 80 mg in December of 2014. Treatment has been on hold for 2 months. Xtandi resumed at 40 mg daily starting on 06/12/2014.  He is on Lupron and Xgeva done at [facility].  .  Interim History: [name] presents today for a followup visit with his wife. Since the last visit, he reports no changes in his health. She is still not able to walk but it is able to stand and pivot with the help of his wife as well as a caregiver. He denied any recent falls or syncope. He denied any pathological fractures.  He denied any recent bone pain or change in his quality of life. He continues to be on Xtandi of the current dose without any further decline in his energy or performance status. He continues to have a indwelling Foley catheter that requires changes every month. He does not have any recent hospitalization or illnesses.   He does not report any headaches, blurry vision, syncope or seizures. He does not report any chest pain, palpitation orthopnea. Has  not reported any cough or hemoptysis or hematemesis. Does not report any nausea or vomiting or abdominal pain. Does not report any constipation or diarrhea. Does not report any hematochezia or melena. Does not report any skin rashes or lesions. Rest of his review of system is unremarkable.  Medications: I have reviewed the patient's current medications.  Current Outpatient Prescriptions  Medication Sig Dispense Refill  . acetaminophen (TYLENOL) 500 MG tablet Take 1,000 mg by mouth daily.     Marland Kitchen amLODipine (NORVASC) 10 MG tablet Take 1 tablet (10 mg total) by mouth daily. 90 tablet 3  . atorvastatin (LIPITOR) 10 MG tablet Take 1 tablet (10 mg total) by mouth daily. 30 tablet 11  . B Complex-C (B-COMPLEX WITH VITAMIN C) tablet Take 1 tablet by mouth daily.      . Denosumab (XGEVA [place]) Inject into the skin every 30 (thirty) days. Monthly. Last inj was 8-19- 2016. Wife not sure what dose it is    . docusate sodium (COLACE) 100 MG capsule Take 100 mg by mouth 2 (two) times daily.    . furosemide (LASIX) 40 MG tablet Take 1 tablet (40 mg total) by mouth daily. 90 tablet 2  . imiquimod (ALDARA) 5 % cream Apply 1 application topically 2 (two) times a week.     Marland Kitchen Leuprolide Acetate (LUPRON DEPOT IM) Inject 1 each into the muscle every 6 (six) months. Last inj was in March 2016    . losartan (COZAAR) 100 MG tablet Take 1 tablet (  100 mg total) by mouth daily. 90 tablet 2  . multivitamin (THERAGRAN) per tablet Take 1 tablet by mouth daily. ( with B - Complex )    . Omega-3 Fatty Acids (FISH OIL PO) Take 1 capsule by mouth daily. ( Mega Red )    . oxybutynin (DITROPAN) 5 MG tablet     . ranitidine (ZANTAC) 150 MG tablet Take 150 mg by mouth at bedtime.    . tamsulosin (FLOMAX) 0.4 MG CAPS capsule Take 1 capsule (0.4 mg total) by mouth daily. 30 capsule 2  . warfarin (COUMADIN) 5 MG tablet TAKE AS DIRECTED 30 tablet 3  . XTANDI 40 MG capsule TAKE 1 CAPSULE BY MOUTH ONCE DAILY 30 capsule 0  . ZETIA 10 MG tablet  TAKE 1 TABLET BY MOUTH ONCE DAILY 90 tablet 3   No current facility-administered medications for this visit.      Allergies:  Allergies  Allergen Reactions  . Pravachol Other (See Comments)    Muscle weakness  . Zocor [Simvastatin - High Dose] Other (See Comments)    Muscle weakness    Past Medical History, Surgical history, Social history, and Family History were reviewed and updated.   Physical Exam: Blood pressure (!) 155/49, pulse 67, temperature 97.7 F (36.5 C), temperature source Oral, resp. rate 16, height 6' (1.829 m), SpO2 100 %. ECOG: 2 General appearance: Alert, awake gentleman without distress. Head: Normocephalic, without obvious abnormality no oral thrush noted. Neck: no adenopathy or masses. Lymph nodes: Cervical, supraclavicular, and axillary nodes normal. Heart:  Irregular rhythm without any murmurs or gallops. Lung:chest clear, no wheezing, rales, no dullness to percussion. Abdomin: soft, non-tender, without masses or organomegaly. No shifting dullness or ascites. EXT: No cyanosis or edema noted. Neurological examination: No deficits noted.  Lab Results: Lab Results  Component Value Date   WBC 9.1 12/16/2015   HGB 13.8 12/16/2015   HCT 43.1 12/16/2015   MCV 95.4 12/16/2015   PLT 174 12/16/2015     Chemistry      Component Value Date/Time   NA 141 11/18/2015 1111   K 4.2 11/18/2015 1111   CL 99 (L) 10/15/2015 2028   CO2 26 11/18/2015 1111   BUN 43.9 (H) 11/18/2015 1111   CREATININE 1.4 (H) 11/18/2015 1111      Component Value Date/Time   CALCIUM 10.5 (H) 11/18/2015 1111   ALKPHOS 71 11/18/2015 1111   AST 14 11/18/2015 1111   ALT 12 11/18/2015 1111   BILITOT 0.54 11/18/2015 1111       Results for DECODA, DELMASTRO (MRN [id number]) as of 12/16/2015 11:57  Ref. Range 09/09/2015 12:15 10/14/2015 11:54 11/18/2015 11:11  PSA Latest Ref Range: 0.0 - 4.0 ng/mL 13.0 (H) 12.9 (H) 15.7 (H)    Impression and Plan:  82 year old gentleman with  the following issues:   1. Castration resistant prostate cancer with metastatic disease to the bone. He started on xtandi in September 2014 and have required dose reduction to 40 mg since that time.   His last PSA Showed slightly increased up to 15.7. His PSA is not dramatically different and clinically remains stable. I recommended continuing the same dose and schedule and consider switching to Zytiga if he develops symptomatic progression of a rapid rise in his PSA.  2. Androgen depravation: He is to continue Lupron at this time. He was given by Dr. Junious Silk at [facility].   3. Bone directed therapy: continues to be on Xgeva and have tolerated it well. I  recommended continuing to receive this at [facility] as he is doing now.  4. Hypercalcemia: His calcium  into the simulator within normal range.  5. Bony metastasis: He have received radiation therapy to the lumbar spine as well as the right femur. No increased pain recently and repeat radiation therapy can be considered in other areas.  6. Generalized weakness: Seems to have improved at this time and unchanged from previous examinations.  7. Followup: in 5 weeks.  [facility], MD 10/24/201712:22 PM

## 2015-12-17 ENCOUNTER — Telehealth: Payer: Self-pay | Admitting: *Deleted

## 2015-12-17 LAB — PSA: PROSTATE SPECIFIC AG, SERUM: 16.2 ng/mL — AB (ref 0.0–4.0)

## 2015-12-17 NOTE — Telephone Encounter (Signed)
As noted below by Dr. Shadad, I informed patient of his PSA level. He verbalized understanding.  

## 2015-12-17 NOTE — Telephone Encounter (Signed)
-----   Message from Wyatt Portela, MD sent at 12/17/2015  8:40 AM EDT ----- Please let him (his wife) know his PSA. Very little change.

## 2015-12-29 ENCOUNTER — Other Ambulatory Visit: Payer: Self-pay | Admitting: Oncology

## 2015-12-29 DIAGNOSIS — C7951 Secondary malignant neoplasm of bone: Secondary | ICD-10-CM

## 2015-12-29 DIAGNOSIS — C61 Malignant neoplasm of prostate: Secondary | ICD-10-CM

## 2015-12-30 ENCOUNTER — Encounter: Payer: Self-pay | Admitting: *Deleted

## 2015-12-30 ENCOUNTER — Other Ambulatory Visit: Payer: Self-pay | Admitting: *Deleted

## 2015-12-30 ENCOUNTER — Telehealth: Payer: Self-pay

## 2015-12-30 DIAGNOSIS — C61 Malignant neoplasm of prostate: Secondary | ICD-10-CM

## 2015-12-30 DIAGNOSIS — C7951 Secondary malignant neoplasm of bone: Principal | ICD-10-CM

## 2015-12-30 DIAGNOSIS — C412 Malignant neoplasm of vertebral column: Secondary | ICD-10-CM

## 2015-12-30 NOTE — Progress Notes (Signed)
Wife betty calling to say patient unable to get out of bed, legs will not hold him. Called [facility] for home health evaluation and coumadin check in the home. Spoke with brad [phone] at [facility]. Betty notified.

## 2015-12-30 NOTE — Telephone Encounter (Signed)
Because of his past medical history, he needs to address this with his urologist. With an indwelling catheter, his urologist needs to decide how to manage this.

## 2015-12-30 NOTE — Telephone Encounter (Signed)
Craig [name], Oddie's wife, called and reports his is having weakness, unable to stand, chills, back pain and blood in urine for the last couple of days. She states he has had a UTI in the past and the symptoms were the same. He does have a catheter. I suggested she bring patient in for an acute visit. She states she can't get him out of the chair. She also has a call into his urologist. Please advise.   Patient has Prostate cancer metastatic to bone.

## 2015-12-30 NOTE — Telephone Encounter (Signed)
Patient's wife advised of recommendations.  

## 2015-12-31 ENCOUNTER — Telehealth: Payer: Self-pay | Admitting: Interventional Cardiology

## 2015-12-31 ENCOUNTER — Telehealth: Payer: Self-pay | Admitting: *Deleted

## 2015-12-31 NOTE — Telephone Encounter (Signed)
[name] is calling in reference to getting [name] set up for home PT/INR testing . Please call

## 2015-12-31 NOTE — Telephone Encounter (Signed)
Spoke with [place] with [facility] and she states she has talked with [employer] and they are going to fax information for getting the [employer] Monitor approved by Dr Irish Lack so pt can be  set up for home monitoring. An Appt has been made for pt to have INR checked on Monday Nov 13th  and hopefully soon will have everything in order so that he can do home INR monitoring This nurse spoke with  Danelle Berry and she states he has been on Amoxicillin for UTI and instructed to have him take his coumadin as instructed on last visit to clinic on  Oct 10th as there is no interaction between Amoxicillin and coumadin and she states she will inform wife as she is in the home with them at present

## 2015-12-31 NOTE — Telephone Encounter (Signed)
Telephoned Mark at [facility] and he will forward order form for cardiologist to sign to get process moving on self-testing.

## 2016-01-01 ENCOUNTER — Telehealth: Payer: Self-pay | Admitting: Pharmacist

## 2016-01-01 ENCOUNTER — Encounter: Payer: Self-pay | Admitting: *Deleted

## 2016-01-01 NOTE — Telephone Encounter (Signed)
[employer] home monitoring form faxed and filed.

## 2016-01-02 ENCOUNTER — Telehealth: Payer: Self-pay | Admitting: *Deleted

## 2016-01-02 NOTE — Telephone Encounter (Signed)
Spoke with Craig [name] after speaking with Craig [name] at [place] and he informed me that he did have all the information that he needed to proceed with getting the monitor set up for home monitoring of his INR and will touch base with Craig [name] next week but she does need to keep his appt in the coumadin clinic on Monday. This information given to Craig [name] and she states understanding. Craig [name]'s phone number at [facility] is [phone] ext [phone]

## 2016-01-05 ENCOUNTER — Ambulatory Visit (INDEPENDENT_AMBULATORY_CARE_PROVIDER_SITE_OTHER): Payer: Medicare Other | Admitting: *Deleted

## 2016-01-05 DIAGNOSIS — I482 Chronic atrial fibrillation, unspecified: Secondary | ICD-10-CM

## 2016-01-05 DIAGNOSIS — Z5181 Encounter for therapeutic drug level monitoring: Secondary | ICD-10-CM | POA: Diagnosis not present

## 2016-01-05 DIAGNOSIS — I4891 Unspecified atrial fibrillation: Secondary | ICD-10-CM | POA: Diagnosis not present

## 2016-01-05 DIAGNOSIS — N3081 Other cystitis with hematuria: Secondary | ICD-10-CM | POA: Diagnosis not present

## 2016-01-05 LAB — POCT INR: INR: 1.8

## 2016-01-11 NOTE — Progress Notes (Signed)
Patient ID: Craig Soelberg., male   DOB: 09-23-1924, 82 y.o.   MRN: [id number]     Cardiology Office Note   Date:  01/13/2016   ID:  Craig Burkes., DOB 1924-03-27, MRN [id number]  PCP:  Mellody Dance, DO    No chief complaint on file. f/u atrial fibrillation   Wt Readings from Last 3 Encounters:  07/15/15 90.7 kg (200 lb)  04/14/15 90.7 kg (200 lb)  02/07/15 90.7 kg (200 lb)       History of Present Illness: Craig Menor. is a 82 y.o. 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  He has seen Dr. Mare Ferrari in the past. The patient was hospitalized in 2016 after patient had coughed up blood as noticed by patient's wife. . In the ER CT angiogram shows chronic pulmonary embolism. He was treated with IV heparin and then transition back to Coumadin and was discharged on Coumadin.  Now his Coumadin is checked at home. Range is 1.8 to 2.2.   The patient is on chronic atrial fibrillation. He has not been having any angina pectoris. He was hospitalized overnight from 12/31/14 until 01/01/15 because of a TIA. This occurred when his INR was 1.7. No changes were made in his regimen. He has not had any further TIA symptoms. Pressure has improved since we increased his amlodipine from 5 mg to 10 mg daily. His chronic edema is no worse.  He had a UTI.  He was on ABx.  It did not change the INR much.  He is doing home INR checks now.    He is on XTandi for his metastatic prostate cancer. He gets some swelling from the prostate cancer medication.  Improves with leg elevation.    THey have not used any additional Lasix.       Past Medical History:  Diagnosis Date  . Atrial fibrillation ([place])   . Chronic back pain   . GERD (gastroesophageal reflux disease)   . H/O  hiatal hernia   . History of epistaxis 07/19/2002  . Hyperlipidemia   . Hypertension   . Hypertensive heart disease   . Hypokalemia   . ICH (intracerebral hemorrhage) ([place]) ~ 1999   after TPA/notes 09/27/2012  . Melanoma ([place])    "top of my head" (09/27/2012)  . Migraines    "migraines without headaches years ago" (09/27/2012)  . Osteoarthritis of both feet   . PAT (paroxysmal atrial tachycardia) ([place])   . Personal history of long-term (current) use of anticoagulants   . Pneumonia    "once; several years ago" (09/27/2012)  . Prostate cancer, primary, with metastasis from prostate to other site [facility])    on Lupron injections per GU  . S/P radiation therapy 02/13/14-02/27/14   SRS 5/5 spine completed 02/27/14  . S/P radiation therapy 05/27/14-05/31/14   right femur 20Gy/19fx  . Stroke [facility]) ~ 1999   ischemic / right cerebellar/posterior inferior cerebellar artery / right pos infarct    Past Surgical History:  Procedure Laterality Date  . CATARACT EXTRACTION W/ INTRAOCULAR LENS  IMPLANT, BILATERAL Bilateral ~ 2011  . MELANOMA EXCISION  2010   "pre-melanoma on top of head; did skin graft from left thigh to cover" (09/27/2012)  . NASAL SINUS SURGERY  1998  . SKIN GRAFT Right 2010  . TONSILLECTOMY  ~ 1935  Current Outpatient Prescriptions  Medication Sig Dispense Refill  . acetaminophen (TYLENOL) 500 MG tablet Take 1,000 mg by mouth daily.     Marland Kitchen amLODipine (NORVASC) 10 MG tablet Take 1 tablet (10 mg total) by mouth daily. 90 tablet 3  . atorvastatin (LIPITOR) 10 MG tablet Take 1 tablet (10 mg total) by mouth daily. 30 tablet 11  . B Complex-C (B-COMPLEX WITH VITAMIN C) tablet Take 1 tablet by mouth daily.      . Denosumab (XGEVA [place]) Inject into the skin every 30 (thirty) days. Monthly. Last inj was 8-19- 2016. Wife not sure what dose it is    . docusate sodium (COLACE) 100 MG capsule Take 100 mg by mouth 2 (two) times daily.    . furosemide (LASIX) 40 MG tablet Take 1 tablet (40 mg total) by  mouth daily. 90 tablet 2  . imiquimod (ALDARA) 5 % cream Apply 1 application topically 2 (two) times a week.     Marland Kitchen Leuprolide Acetate (LUPRON DEPOT IM) Inject 1 each into the muscle every 6 (six) months. Last inj was in March 2016    . losartan (COZAAR) 100 MG tablet Take 1 tablet (100 mg total) by mouth daily. 90 tablet 2  . multivitamin (THERAGRAN) per tablet Take 1 tablet by mouth daily. ( with B - Complex )    . Omega-3 Fatty Acids (FISH OIL PO) Take 1 capsule by mouth daily. ( Mega Red )    . oxybutynin (DITROPAN) 5 MG tablet     . ranitidine (ZANTAC) 150 MG tablet Take 150 mg by mouth at bedtime.    . tamsulosin (FLOMAX) 0.4 MG CAPS capsule Take 1 capsule (0.4 mg total) by mouth daily. 30 capsule 2  . warfarin (COUMADIN) 5 MG tablet TAKE AS DIRECTED 30 tablet 3  . XTANDI 40 MG capsule TAKE 1 CAPSULE BY MOUTH ONCE DAILY 30 capsule 0  . ZETIA 10 MG tablet TAKE 1 TABLET BY MOUTH ONCE DAILY 90 tablet 3   No current facility-administered medications for this visit.     Allergies:   Pravachol and Zocor [simvastatin - high dose]    Social History:  The patient  reports that he quit smoking about 62 years ago. His smoking use included Cigarettes. He quit after 0.50 years of use. He has never used smokeless tobacco. He reports that he does not drink alcohol or use drugs.   Family History:  The patient's family history includes Heart attack in his father; Heart failure in his mother.    ROS:  Please see the history of present illness.   Otherwise, review of systems are positive for bladder spasms.   All other systems are reviewed and negative.    PHYSICAL EXAM: VS:  BP (!) 160/70   Pulse 65   Ht 6' (1.829 m)   SpO2 97%  , BMI There is no height or weight on file to calculate BMI. GEN: Well nourished, well developed, in no acute distress  HEENT: normal  Neck: no JVD, carotid bruits, or masses Cardiac: irregularly irregular; 2/6 systolic  murmur, no rubs, or gallops,no edema    Respiratory:  clear to auscultation bilaterally, normal work of breathing GI: soft, nontender, nondistended, + BS MS: no deformity or atrophy  Skin: warm and dry, no rash Neuro:  Strength and sensation are intact Psych: euthymic mood, full affect   EKG:   The ekg ordered today demonstrates AFib, rate controlled.   Recent Labs: 12/16/2015: ALT 11; BUN 45.1; Creatinine  1.3; HGB 13.8; Platelets 174; Potassium 4.0; Sodium 141   Lipid Panel    Component Value Date/Time   CHOL 137 07/15/2015 1156   TRIG 132 07/15/2015 1156   HDL 56 07/15/2015 1156   CHOLHDL 2.4 07/15/2015 1156   VLDL 26 07/15/2015 1156   LDLCALC 55 07/15/2015 1156     Other studies Reviewed: Additional studies/ records that were reviewed today with results demonstrating: Echo from 11/16: EF 35%, mild AS.Marland Kitchen   ASSESSMENT AND PLAN:  1. AFib: Chronic anticoagulation with h/o stroke. No palpitations. Continue current rate control and stroke prevention. No bleeding problems noted.    2. Hypertensive heart disease wihtout heart failure: adequately controlled.  3. CKD stage III: check labs today. 4. Aortic atherosclerosis.: Had a small AAA in the past per his report, but he is not interested in further monitoring at this time given his overall condition.  I think this is reasonable.  5. Mild AS: Murmur on exam.  No sx of severe AS.  6. Chronic systolic and diastolic heart failure: appears euvolemic.  I gave the wife the flexibility to give an extra 20 mg of Lasix. She has not had to do this recently.  H/o hemoptysis with pulmonary embolism. No recent bleeding.   Current medicines are reviewed at length with the patient today.  The patient concerns regarding his medicines were addressed.  The following changes have been made:  No change  Labs/ tests ordered today include:  No orders of the defined types were placed in this encounter.   Recommend 150 minutes/week of aerobic exercise Low fat, low carb, high fiber  diet recommended  Disposition:   FU in 6 months   Signed, Larae Grooms, MD  01/13/2016 11:07 AM    [facility] [address], [place], [place]  [address] Phone: [phone]; Fax: [phone]

## 2016-01-12 ENCOUNTER — Ambulatory Visit (INDEPENDENT_AMBULATORY_CARE_PROVIDER_SITE_OTHER): Payer: Medicare Other | Admitting: Internal Medicine

## 2016-01-12 DIAGNOSIS — I480 Paroxysmal atrial fibrillation: Secondary | ICD-10-CM | POA: Diagnosis not present

## 2016-01-12 DIAGNOSIS — I482 Chronic atrial fibrillation, unspecified: Secondary | ICD-10-CM

## 2016-01-12 DIAGNOSIS — Z5181 Encounter for therapeutic drug level monitoring: Secondary | ICD-10-CM

## 2016-01-12 LAB — PROTIME-INR

## 2016-01-12 LAB — POCT INR: INR: 1.7

## 2016-01-13 ENCOUNTER — Encounter: Payer: Self-pay | Admitting: Interventional Cardiology

## 2016-01-13 ENCOUNTER — Ambulatory Visit (INDEPENDENT_AMBULATORY_CARE_PROVIDER_SITE_OTHER): Payer: Medicare Other | Admitting: Interventional Cardiology

## 2016-01-13 ENCOUNTER — Encounter (INDEPENDENT_AMBULATORY_CARE_PROVIDER_SITE_OTHER): Payer: Self-pay

## 2016-01-13 VITALS — BP 160/70 | HR 65 | Ht 72.0 in

## 2016-01-13 DIAGNOSIS — I482 Chronic atrial fibrillation, unspecified: Secondary | ICD-10-CM

## 2016-01-13 DIAGNOSIS — I714 Abdominal aortic aneurysm, without rupture, unspecified: Secondary | ICD-10-CM

## 2016-01-13 DIAGNOSIS — I10 Essential (primary) hypertension: Secondary | ICD-10-CM

## 2016-01-13 DIAGNOSIS — I119 Hypertensive heart disease without heart failure: Secondary | ICD-10-CM | POA: Diagnosis not present

## 2016-01-13 DIAGNOSIS — I350 Nonrheumatic aortic (valve) stenosis: Secondary | ICD-10-CM

## 2016-01-13 NOTE — Patient Instructions (Signed)
**Note De-identified Shawnette Augello Obfuscation** Medication Instructions:  Same-no changes  Labwork: None  Testing/Procedures: None  Follow-Up: Your physician wants you to follow-up in: 6 months. You will receive a reminder letter in the mail two months in advance. If you don't receive a letter, please call our office to schedule the follow-up appointment.      If you need a refill on your cardiac medications before your next appointment, please call your pharmacy.   

## 2016-01-19 ENCOUNTER — Ambulatory Visit (INDEPENDENT_AMBULATORY_CARE_PROVIDER_SITE_OTHER): Payer: Medicare Other | Admitting: Cardiovascular Disease

## 2016-01-19 DIAGNOSIS — Z5181 Encounter for therapeutic drug level monitoring: Secondary | ICD-10-CM

## 2016-01-19 DIAGNOSIS — I482 Chronic atrial fibrillation, unspecified: Secondary | ICD-10-CM

## 2016-01-19 LAB — POCT INR: INR: 2.4

## 2016-01-20 ENCOUNTER — Ambulatory Visit (HOSPITAL_BASED_OUTPATIENT_CLINIC_OR_DEPARTMENT_OTHER): Payer: Medicare Other | Admitting: Oncology

## 2016-01-20 ENCOUNTER — Telehealth: Payer: Self-pay | Admitting: Oncology

## 2016-01-20 ENCOUNTER — Other Ambulatory Visit (HOSPITAL_BASED_OUTPATIENT_CLINIC_OR_DEPARTMENT_OTHER): Payer: Medicare Other

## 2016-01-20 VITALS — BP 152/72 | HR 60 | Resp 14 | Ht 72.0 in

## 2016-01-20 DIAGNOSIS — C61 Malignant neoplasm of prostate: Secondary | ICD-10-CM | POA: Diagnosis not present

## 2016-01-20 DIAGNOSIS — E291 Testicular hypofunction: Secondary | ICD-10-CM

## 2016-01-20 DIAGNOSIS — R351 Nocturia: Secondary | ICD-10-CM

## 2016-01-20 DIAGNOSIS — C7951 Secondary malignant neoplasm of bone: Secondary | ICD-10-CM | POA: Diagnosis not present

## 2016-01-20 LAB — CBC WITH DIFFERENTIAL/PLATELET
BASO%: 0.5 % (ref 0.0–2.0)
BASOS ABS: 0 10*3/uL (ref 0.0–0.1)
EOS%: 3.1 % (ref 0.0–7.0)
Eosinophils Absolute: 0.2 10*3/uL (ref 0.0–0.5)
HCT: 42.3 % (ref 38.4–49.9)
HGB: 14 g/dL (ref 13.0–17.1)
LYMPH%: 22.7 % (ref 14.0–49.0)
MCH: 31.6 pg (ref 27.2–33.4)
MCHC: 33.1 g/dL (ref 32.0–36.0)
MCV: 95.5 fL (ref 79.3–98.0)
MONO#: 0.9 10*3/uL (ref 0.1–0.9)
MONO%: 12.1 % (ref 0.0–14.0)
NEUT#: 4.6 10*3/uL (ref 1.5–6.5)
NEUT%: 61.6 % (ref 39.0–75.0)
Platelets: 164 10*3/uL (ref 140–400)
RBC: 4.43 10*6/uL (ref 4.20–5.82)
RDW: 14.9 % — ABNORMAL HIGH (ref 11.0–14.6)
WBC: 7.5 10*3/uL (ref 4.0–10.3)
lymph#: 1.7 10*3/uL (ref 0.9–3.3)

## 2016-01-20 LAB — COMPREHENSIVE METABOLIC PANEL
ALT: 13 U/L (ref 0–55)
AST: 13 U/L (ref 5–34)
Albumin: 3.1 g/dL — ABNORMAL LOW (ref 3.5–5.0)
Alkaline Phosphatase: 74 U/L (ref 40–150)
Anion Gap: 9 mEq/L (ref 3–11)
BUN: 50.1 mg/dL — AB (ref 7.0–26.0)
CALCIUM: 11.2 mg/dL — AB (ref 8.4–10.4)
CHLORIDE: 103 meq/L (ref 98–109)
CO2: 30 mEq/L — ABNORMAL HIGH (ref 22–29)
Creatinine: 1.5 mg/dL — ABNORMAL HIGH (ref 0.7–1.3)
EGFR: 42 mL/min/{1.73_m2} — ABNORMAL LOW (ref >90)
GLUCOSE: 107 mg/dL (ref 70–140)
POTASSIUM: 4.5 meq/L (ref 3.5–5.1)
SODIUM: 143 meq/L (ref 136–145)
Total Bilirubin: 0.53 mg/dL (ref 0.20–1.20)
Total Protein: 7.6 g/dL (ref 6.4–8.3)

## 2016-01-20 NOTE — Progress Notes (Signed)
Hematology and Oncology Follow Up Visit  Craig [name] [id number] [birth year] 82 y.o. 01/20/2016 12:20 PM Craig [name], Craig Ouch, DO   Principle Diagnosis: 82 year old with Castration resistant prostate cancer with metastatic disease to the bone. He was initially diagnosed in 2011 PSA of 19 and presented with advanced disease.   Prior Therapy: He is status post combined androgen deprivation with Lupron and Casodex with an excellent response initially and had a PSA nadir down to 1.67. Most recently he developed progression of disease and a PSA up to 5.94 in September of 2014. He is status post SRS treatment to the L4 completed on 02/13/2014. He is S/P stereotactic body radiotherapy to the iliac bone treatment completed in 02/2014. He is status post radiation therapy to the right femur completed on 06/07/2014.  Current therapy: He is on Xtandi started on 11/15/2012. He was started on 160 mg initially but the dose was reduced to 80 mg in December of 2014. Treatment has been on hold for 2 months. Xtandi resumed at 40 mg daily starting on 06/12/2014.  He is on Lupron and Xgeva done at [facility].  .  Interim History: [name] presents today for a followup visit with his wife. Since the last visit, he continues to be relatively stable without any recent complaints. His mobility remains limited but still able to sit in the chair and stand for short period of time. He is still relies on his wife as his caregiver. He denied any recent falls or syncope. He denied any pathological fractures.  He denied any recent bone pain or change in his quality of life. He continues to be on Xtandi of the current dose without any further decline in his energy or performance status. He did have a urinary tract infection and his catheter needed to be changed.   He does not report any headaches, blurry vision, syncope or seizures. He does not report any chest pain, palpitation orthopnea. Has  not reported any cough or hemoptysis or hematemesis. Does not report any nausea or vomiting or abdominal pain. Does not report any constipation or diarrhea. Does not report any hematochezia or melena. Does not report any skin rashes or lesions. Rest of his review of system is unremarkable.  Medications: I have reviewed the patient's current medications.  Current Outpatient Prescriptions  Medication Sig Dispense Refill  . acetaminophen (TYLENOL) 500 MG tablet Take 1,000 mg by mouth daily.     Marland Kitchen amLODipine (NORVASC) 10 MG tablet Take 1 tablet (10 mg total) by mouth daily. 90 tablet 3  . atorvastatin (LIPITOR) 10 MG tablet Take 1 tablet (10 mg total) by mouth daily. 30 tablet 11  . B Complex-C (B-COMPLEX WITH VITAMIN C) tablet Take 1 tablet by mouth daily.      . Denosumab (XGEVA [place]) Inject into the skin every 30 (thirty) days. Monthly. Last inj was 8-19- 2016. Wife not sure what dose it is    . docusate sodium (COLACE) 100 MG capsule Take 100 mg by mouth 2 (two) times daily.    . furosemide (LASIX) 40 MG tablet Take 1 tablet (40 mg total) by mouth daily. 90 tablet 2  . imiquimod (ALDARA) 5 % cream Apply 1 application topically 2 (two) times a week.     Marland Kitchen Leuprolide Acetate (LUPRON DEPOT IM) Inject 1 each into the muscle every 6 (six) months. Last inj was in March 2016    . losartan (COZAAR) 100 MG tablet Take 1 tablet (100 mg total)  by mouth daily. 90 tablet 2  . multivitamin (THERAGRAN) per tablet Take 1 tablet by mouth daily. ( with B - Complex )    . Omega-3 Fatty Acids (FISH OIL PO) Take 1 capsule by mouth daily. ( Mega Red )    . oxybutynin (DITROPAN) 5 MG tablet     . ranitidine (ZANTAC) 150 MG tablet Take 150 mg by mouth at bedtime.    . tamsulosin (FLOMAX) 0.4 MG CAPS capsule Take 1 capsule (0.4 mg total) by mouth daily. 30 capsule 2  . warfarin (COUMADIN) 5 MG tablet TAKE AS DIRECTED 30 tablet 3  . XTANDI 40 MG capsule TAKE 1 CAPSULE BY MOUTH ONCE DAILY 30 capsule 0  . ZETIA 10 MG tablet  TAKE 1 TABLET BY MOUTH ONCE DAILY 90 tablet 3   No current facility-administered medications for this visit.      Allergies:  Allergies  Allergen Reactions  . Pravachol Other (See Comments)    Muscle weakness  . Zocor [Simvastatin - High Dose] Other (See Comments)    Muscle weakness    Past Medical History, Surgical history, Social history, and Family History were reviewed and updated.   Physical Exam: Blood pressure (!) 152/72, pulse 60, resp. rate 14, height 6' (1.829 m), SpO2 97 %. ECOG: 2 General appearance: Elderly frail gentleman without distress. Head: Normocephalic, without obvious abnormality no oral ulcers or thrush. Neck: no adenopathy or masses. Lymph nodes: Cervical, supraclavicular, and axillary nodes normal. Heart:  Irregular rhythm without any murmurs or gallops. Lung:chest clear, no wheezing, rales, no dullness to percussion. Abdomin: soft, non-tender, without masses or organomegaly. No rebound or guarding. EXT: No cyanosis or edema noted. Neurological examination: No deficits noted.  Lab Results: Lab Results  Component Value Date   WBC 7.5 01/20/2016   HGB 14.0 01/20/2016   HCT 42.3 01/20/2016   MCV 95.5 01/20/2016   PLT 164 01/20/2016     Chemistry      Component Value Date/Time   NA 141 12/16/2015 1138   K 4.0 12/16/2015 1138   CL 99 (L) 10/15/2015 2028   CO2 27 12/16/2015 1138   BUN 45.1 (H) 12/16/2015 1138   CREATININE 1.3 12/16/2015 1138      Component Value Date/Time   CALCIUM 10.4 12/16/2015 1138   ALKPHOS 69 12/16/2015 1138   AST 14 12/16/2015 1138   ALT 11 12/16/2015 1138   BILITOT 0.49 12/16/2015 1138      Results for Craig [name] (MRN [id number]) as of 01/20/2016 12:12  Ref. Range 10/14/2015 11:54 11/18/2015 11:11 12/16/2015 11:38  PSA Latest Ref Range: 0.0 - 4.0 ng/mL 12.9 (H) 15.7 (H) 16.2 (H)      Impression and Plan:  82 year old gentleman with the following issues:   1. Castration resistant prostate cancer  with metastatic disease to the bone. He started on xtandi in September 2014 and have required dose reduction to 40 mg since that time.   His last PSA Showed slightly increased up to 16.2. His doubling time has been close to 6 months and he remains asymptomatic from his prostate cancer. At this time, we have elected to continue with Xtandi and switched to [facility] if he develops rapid rise in his PSA or symptomatic progression.  2. Androgen depravation: He is to continue Lupron at this time. He was given by Dr. Junious Silk at [facility].   3. Bone directed therapy: continues to be on Xgeva and have tolerated it well. I see no issues of continuing  this medication at this time.  4. Hypercalcemia: His calcium was within normal range on 12/16/2015. This was repeated today.  5. Bony metastasis: He have received radiation therapy to the lumbar spine as well as the right femur. No increased pain recently and repeat radiation therapy can be considered in other areas.  6. Generalized weakness: Unchanged at this time and we'll continue to monitor moving forward.  7. Followup: in January 2017.  Zola Button, MD 11/28/201712:20 PM

## 2016-01-20 NOTE — Telephone Encounter (Signed)
Appointment scheduled per 11/28 LOS. Patient given AVS report and calendars with scheduled appointments.

## 2016-01-21 ENCOUNTER — Telehealth: Payer: Self-pay | Admitting: *Deleted

## 2016-01-21 LAB — PSA: Prostate Specific Ag, Serum: 21.1 ng/mL — ABNORMAL HIGH (ref 0.0–4.0)

## 2016-01-21 NOTE — Telephone Encounter (Signed)
As noted below by Dr. Shadad, I informed the patient of his PSA level. Patient verbalized understanding. 

## 2016-01-21 NOTE — Telephone Encounter (Signed)
-----   Message from Wyatt Portela, MD sent at 01/21/2016  8:39 AM EST -----   ----- Message ----- From: Wyatt Portela, MD Sent: 01/21/2016   8:21 AM To: Randolm Idol, RN  Please let him know his PSA. No change for now.

## 2016-01-26 ENCOUNTER — Ambulatory Visit (INDEPENDENT_AMBULATORY_CARE_PROVIDER_SITE_OTHER): Payer: Medicare Other | Admitting: Cardiovascular Disease

## 2016-01-26 DIAGNOSIS — I482 Chronic atrial fibrillation, unspecified: Secondary | ICD-10-CM

## 2016-01-26 DIAGNOSIS — Z5181 Encounter for therapeutic drug level monitoring: Secondary | ICD-10-CM

## 2016-01-26 LAB — POCT INR: INR: 1.8

## 2016-01-30 ENCOUNTER — Other Ambulatory Visit: Payer: Self-pay | Admitting: Oncology

## 2016-01-30 DIAGNOSIS — C7951 Secondary malignant neoplasm of bone: Secondary | ICD-10-CM

## 2016-01-30 DIAGNOSIS — C61 Malignant neoplasm of prostate: Secondary | ICD-10-CM

## 2016-02-06 DIAGNOSIS — C7951 Secondary malignant neoplasm of bone: Secondary | ICD-10-CM | POA: Diagnosis not present

## 2016-02-06 DIAGNOSIS — C61 Malignant neoplasm of prostate: Secondary | ICD-10-CM | POA: Diagnosis not present

## 2016-02-06 DIAGNOSIS — R339 Retention of urine, unspecified: Secondary | ICD-10-CM | POA: Diagnosis not present

## 2016-02-09 ENCOUNTER — Ambulatory Visit (INDEPENDENT_AMBULATORY_CARE_PROVIDER_SITE_OTHER): Payer: Medicare Other | Admitting: Pharmacist

## 2016-02-09 DIAGNOSIS — I482 Chronic atrial fibrillation, unspecified: Secondary | ICD-10-CM

## 2016-02-09 DIAGNOSIS — Z5181 Encounter for therapeutic drug level monitoring: Secondary | ICD-10-CM

## 2016-02-09 LAB — POCT INR: INR: 1.9

## 2016-02-11 DIAGNOSIS — I480 Paroxysmal atrial fibrillation: Secondary | ICD-10-CM | POA: Diagnosis not present

## 2016-02-24 ENCOUNTER — Ambulatory Visit (INDEPENDENT_AMBULATORY_CARE_PROVIDER_SITE_OTHER): Payer: Medicare Other | Admitting: Pharmacist

## 2016-02-24 DIAGNOSIS — Z5181 Encounter for therapeutic drug level monitoring: Secondary | ICD-10-CM

## 2016-02-24 DIAGNOSIS — I482 Chronic atrial fibrillation, unspecified: Secondary | ICD-10-CM

## 2016-02-24 LAB — POCT INR: INR: 2

## 2016-03-01 ENCOUNTER — Other Ambulatory Visit: Payer: Self-pay | Admitting: Oncology

## 2016-03-01 DIAGNOSIS — C61 Malignant neoplasm of prostate: Secondary | ICD-10-CM

## 2016-03-01 DIAGNOSIS — C7951 Secondary malignant neoplasm of bone: Secondary | ICD-10-CM

## 2016-03-08 ENCOUNTER — Ambulatory Visit (INDEPENDENT_AMBULATORY_CARE_PROVIDER_SITE_OTHER): Payer: Medicare Other | Admitting: Cardiology

## 2016-03-08 DIAGNOSIS — Z5181 Encounter for therapeutic drug level monitoring: Secondary | ICD-10-CM

## 2016-03-08 DIAGNOSIS — I482 Chronic atrial fibrillation, unspecified: Secondary | ICD-10-CM

## 2016-03-08 LAB — POCT INR: INR: 1.9

## 2016-03-09 ENCOUNTER — Other Ambulatory Visit (HOSPITAL_BASED_OUTPATIENT_CLINIC_OR_DEPARTMENT_OTHER): Payer: Medicare Other

## 2016-03-09 ENCOUNTER — Telehealth: Payer: Self-pay | Admitting: Oncology

## 2016-03-09 ENCOUNTER — Ambulatory Visit (HOSPITAL_BASED_OUTPATIENT_CLINIC_OR_DEPARTMENT_OTHER): Payer: Medicare Other | Admitting: Oncology

## 2016-03-09 VITALS — BP 171/63 | HR 64 | Temp 97.4°F | Resp 18 | Ht 72.0 in

## 2016-03-09 DIAGNOSIS — C61 Malignant neoplasm of prostate: Secondary | ICD-10-CM

## 2016-03-09 DIAGNOSIS — E291 Testicular hypofunction: Secondary | ICD-10-CM | POA: Diagnosis not present

## 2016-03-09 DIAGNOSIS — R339 Retention of urine, unspecified: Secondary | ICD-10-CM | POA: Diagnosis not present

## 2016-03-09 DIAGNOSIS — C7951 Secondary malignant neoplasm of bone: Secondary | ICD-10-CM | POA: Diagnosis not present

## 2016-03-09 LAB — COMPREHENSIVE METABOLIC PANEL
ALBUMIN: 3.3 g/dL — AB (ref 3.5–5.0)
ALT: 11 U/L (ref 0–55)
AST: 14 U/L (ref 5–34)
Alkaline Phosphatase: 62 U/L (ref 40–150)
Anion Gap: 9 mEq/L (ref 3–11)
BILIRUBIN TOTAL: 0.56 mg/dL (ref 0.20–1.20)
BUN: 41.4 mg/dL — ABNORMAL HIGH (ref 7.0–26.0)
CO2: 26 meq/L (ref 22–29)
CREATININE: 1.3 mg/dL (ref 0.7–1.3)
Calcium: 10.5 mg/dL — ABNORMAL HIGH (ref 8.4–10.4)
Chloride: 107 mEq/L (ref 98–109)
EGFR: 47 mL/min/{1.73_m2} — ABNORMAL LOW (ref >90)
GLUCOSE: 118 mg/dL (ref 70–140)
Potassium: 4.2 mEq/L (ref 3.5–5.1)
SODIUM: 142 meq/L (ref 136–145)
TOTAL PROTEIN: 7.4 g/dL (ref 6.4–8.3)

## 2016-03-09 LAB — CBC WITH DIFFERENTIAL/PLATELET
BASO%: 0.5 % (ref 0.0–2.0)
Basophils Absolute: 0 10*3/uL (ref 0.0–0.1)
EOS ABS: 0.2 10*3/uL (ref 0.0–0.5)
EOS%: 2.5 % (ref 0.0–7.0)
HCT: 43.7 % (ref 38.4–49.9)
HEMOGLOBIN: 14.5 g/dL (ref 13.0–17.1)
LYMPH%: 20.7 % (ref 14.0–49.0)
MCH: 31.7 pg (ref 27.2–33.4)
MCHC: 33.2 g/dL (ref 32.0–36.0)
MCV: 95.4 fL (ref 79.3–98.0)
MONO#: 0.8 10*3/uL (ref 0.1–0.9)
MONO%: 9.4 % (ref 0.0–14.0)
NEUT%: 66.9 % (ref 39.0–75.0)
NEUTROS ABS: 5.7 10*3/uL (ref 1.5–6.5)
PLATELETS: 166 10*3/uL (ref 140–400)
RBC: 4.58 10*6/uL (ref 4.20–5.82)
RDW: 15.5 % — AB (ref 11.0–14.6)
WBC: 8.5 10*3/uL (ref 4.0–10.3)
lymph#: 1.8 10*3/uL (ref 0.9–3.3)

## 2016-03-09 NOTE — Telephone Encounter (Signed)
Gave patient avs report and appointments for February. Central radiology to call re scan.

## 2016-03-09 NOTE — Progress Notes (Signed)
Hematology and Oncology Follow Up Visit  Facundo Lantis [id number] 08-14-1924 83 y.o. 03/09/2016 12:18 PM Mellody Dance, [name], DO   Principle Diagnosis: 83 year old with Castration resistant prostate cancer with metastatic disease to the bone. He was initially diagnosed in 2011 PSA of 19 and presented with advanced disease.   Prior Therapy: He is status post combined androgen deprivation with Lupron and Casodex with an excellent response initially and had a PSA nadir down to 1.67. Most recently he developed progression of disease and a PSA up to 5.94 in September of 2014. He is status post SRS treatment to the L4 completed on 02/13/2014. He is S/P stereotactic body radiotherapy to the iliac bone treatment completed in 02/2014. He is status post radiation therapy to the right femur completed on 06/07/2014.  Current therapy: He is on Xtandi started on 11/15/2012. He was started on 160 mg initially but the dose was reduced to 80 mg in December of 2014. Treatment has been on hold for 2 months. Xtandi resumed at 40 mg daily starting on 06/12/2014.  He is on Lupron and Xgeva done at [facility].  .  Interim History: [name] presents today for a followup visit with his wife. Since the last visit, he reports no major changes in his health. He continues to be on Xtandi of the current dose without any further decline in his energy or performance status. He did have a urinary tract infection and his catheter needed to be changed.  His mobility remains limited but still able to sit in the chair and stand for short period of time. He denied any recent falls or syncope. He denied any pathological fractures.  He denied any recent bone pain or change in his quality of life. He does report slight increase in his lower extremity edema but does take Lasix periodically for it.  He does not report any headaches, blurry vision, syncope or seizures. He does not report any chest pain,  palpitation orthopnea. Has not reported any cough or hemoptysis or hematemesis. Does not report any nausea or vomiting or abdominal pain. Does not report any constipation or diarrhea. Does not report any hematochezia or melena. Does not report any skin rashes or lesions. Rest of his review of system is unremarkable.  Medications: I have reviewed the patient's current medications.  Current Outpatient Prescriptions  Medication Sig Dispense Refill  . acetaminophen (TYLENOL) 500 MG tablet Take 1,000 mg by mouth daily.     Marland Kitchen amLODipine (NORVASC) 10 MG tablet Take 1 tablet (10 mg total) by mouth daily. 90 tablet 3  . atorvastatin (LIPITOR) 10 MG tablet Take 1 tablet (10 mg total) by mouth daily. 30 tablet 11  . B Complex-C (B-COMPLEX WITH VITAMIN C) tablet Take 1 tablet by mouth daily.      . Denosumab (XGEVA [place]) Inject into the skin every 30 (thirty) days. Monthly. Last inj was 8-19- 2016. Wife not sure what dose it is    . docusate sodium (COLACE) 100 MG capsule Take 100 mg by mouth 2 (two) times daily.    . furosemide (LASIX) 40 MG tablet Take 1 tablet (40 mg total) by mouth daily. 90 tablet 2  . imiquimod (ALDARA) 5 % cream Apply 1 application topically 2 (two) times a week.     Marland Kitchen Leuprolide Acetate (LUPRON DEPOT IM) Inject 1 each into the muscle every 6 (six) months. Last inj was in March 2016    . losartan (COZAAR) 100 MG tablet Take  1 tablet (100 mg total) by mouth daily. 90 tablet 2  . multivitamin (THERAGRAN) per tablet Take 1 tablet by mouth daily. ( with B - Complex )    . Omega-3 Fatty Acids (FISH OIL PO) Take 1 capsule by mouth daily. ( Mega Red )    . oxybutynin (DITROPAN) 5 MG tablet     . Probiotic Product (PROBIOTIC ADVANCED PO) Take 1 capsule by mouth daily.    . ranitidine (ZANTAC) 150 MG tablet Take 150 mg by mouth at bedtime.    . tamsulosin (FLOMAX) 0.4 MG CAPS capsule Take 1 capsule (0.4 mg total) by mouth daily. 30 capsule 2  . warfarin (COUMADIN) 5 MG tablet TAKE AS DIRECTED  30 tablet 3  . XTANDI 40 MG capsule TAKE 1 CAPSULE BY MOUTH ONCE DAILY 30 capsule 0  . ZETIA 10 MG tablet TAKE 1 TABLET BY MOUTH ONCE DAILY 90 tablet 3   No current facility-administered medications for this visit.      Allergies:  Allergies  Allergen Reactions  . Pravachol Other (See Comments)    Muscle weakness  . Zocor [Simvastatin - High Dose] Other (See Comments)    Muscle weakness    Past Medical History, Surgical history, Social history, and Family History were reviewed and updated.   Physical Exam: Blood pressure (!) 171/63, pulse 64, temperature 97.4 F (36.3 C), temperature source Oral, resp. rate 18, height 6' (1.829 m), SpO2 99 %. ECOG: 2 General appearance: Alert, awake gentleman appeared comfortable. Head: Normocephalic, without obvious abnormality no oral thrush noted. Neck: no adenopathy or masses. Lymph nodes: Cervical, supraclavicular, and axillary nodes normal. Heart:  Irregular rhythm without any murmurs or gallops. Lung:chest clear, no wheezing, rales, no dullness to percussion. Abdomin: soft, non-tender, without masses or organomegaly. No shifting dullness or ascites. EXT: No cyanosis or edema noted. Neurological examination: No deficits noted.  Lab Results: Lab Results  Component Value Date   WBC 8.5 03/09/2016   HGB 14.5 03/09/2016   HCT 43.7 03/09/2016   MCV 95.4 03/09/2016   PLT 166 03/09/2016     Chemistry      Component Value Date/Time   NA 143 01/20/2016 1153   K 4.5 01/20/2016 1153   CL 99 (L) 10/15/2015 2028   CO2 30 (H) 01/20/2016 1153   BUN 50.1 (H) 01/20/2016 1153   CREATININE 1.5 (H) 01/20/2016 1153      Component Value Date/Time   CALCIUM 11.2 (H) 01/20/2016 1153   ALKPHOS 74 01/20/2016 1153   AST 13 01/20/2016 1153   ALT 13 01/20/2016 1153   BILITOT 0.53 01/20/2016 1153        Results for SNOW, FLEEGER (MRN [id number]) as of 03/09/2016 11:59  Ref. Range 11/18/2015 11:11 12/16/2015 11:38 01/20/2016 11:53  PSA  Latest Ref Range: 0.0 - 4.0 ng/mL 15.7 (H) 16.2 (H) 21.1 (H)     Impression and Plan:  83 year old gentleman with the following issues:   1. Castration resistant prostate cancer with metastatic disease to the bone. He started on xtandi in September 2014 and have required dose reduction to 40 mg since that time.   His PSA has been rising slowly in the last 6 months and have doubled bowel in this interval. I recommend repeating bone scan in the next 4-6 weeks and consider switching to Zytiga if he has clear progression of disease. If his disease remains stable, we'll continue Xtandi without any modifications.  2. Androgen depravation: He is to continue Lupron at this  time. He receives this under the care of Dr. Junious Silk. I have recommended continuing this indefinitely.  3. Bone directed therapy: continues to be on Xgeva and have tolerated it well. I see no issues of continuing this medication at this time.  4. Hypercalcemia: His calcium slowly increasing although he is able symptomatic. This was rechecked today.  5. Bony metastasis: He have received radiation therapy to the lumbar spine as well as the right femur. No increased pain recently and repeat radiation therapy can be considered in other areas.  6. Generalized weakness: Resolved at this time and he is back to his baseline.  7. Followup: in February 2018.  Zola Button, MD 1/16/201812:18 PM

## 2016-03-10 ENCOUNTER — Telehealth: Payer: Self-pay | Admitting: *Deleted

## 2016-03-10 LAB — PSA: PROSTATE SPECIFIC AG, SERUM: 21 ng/mL — AB (ref 0.0–4.0)

## 2016-03-10 NOTE — Progress Notes (Signed)
Spoke with patient's wife betty, gave PSA result from yesterday

## 2016-03-10 NOTE — Telephone Encounter (Signed)
"  My husband was seen yesterday.  Could you give me the PSA lab results?" PSA = 21.0.  This is unchanged and stable.  No further questions.

## 2016-03-12 DIAGNOSIS — I480 Paroxysmal atrial fibrillation: Secondary | ICD-10-CM | POA: Diagnosis not present

## 2016-03-15 ENCOUNTER — Other Ambulatory Visit: Payer: Self-pay | Admitting: *Deleted

## 2016-03-15 MED ORDER — EZETIMIBE 10 MG PO TABS: 10.0000 mg | ORAL_TABLET | Freq: Every day | ORAL | 2 refills | Status: DC

## 2016-03-15 MED ORDER — AMLODIPINE BESYLATE 10 MG PO TABS: 10.0000 mg | ORAL_TABLET | Freq: Every day | ORAL | 1 refills | Status: DC

## 2016-03-22 ENCOUNTER — Ambulatory Visit (INDEPENDENT_AMBULATORY_CARE_PROVIDER_SITE_OTHER): Payer: Medicare Other | Admitting: Internal Medicine

## 2016-03-22 DIAGNOSIS — I482 Chronic atrial fibrillation, unspecified: Secondary | ICD-10-CM

## 2016-03-22 DIAGNOSIS — Z5181 Encounter for therapeutic drug level monitoring: Secondary | ICD-10-CM

## 2016-03-22 LAB — POCT INR: INR: 2

## 2016-03-29 ENCOUNTER — Other Ambulatory Visit: Payer: Self-pay | Admitting: Oncology

## 2016-03-29 DIAGNOSIS — C61 Malignant neoplasm of prostate: Secondary | ICD-10-CM

## 2016-03-29 DIAGNOSIS — C7951 Secondary malignant neoplasm of bone: Secondary | ICD-10-CM

## 2016-04-05 ENCOUNTER — Ambulatory Visit (INDEPENDENT_AMBULATORY_CARE_PROVIDER_SITE_OTHER): Payer: Medicare Other | Admitting: Cardiology

## 2016-04-05 DIAGNOSIS — Z5181 Encounter for therapeutic drug level monitoring: Secondary | ICD-10-CM

## 2016-04-05 DIAGNOSIS — I482 Chronic atrial fibrillation, unspecified: Secondary | ICD-10-CM

## 2016-04-05 LAB — POCT INR: INR: 1.9

## 2016-04-09 ENCOUNTER — Other Ambulatory Visit (HOSPITAL_BASED_OUTPATIENT_CLINIC_OR_DEPARTMENT_OTHER): Payer: Medicare Other

## 2016-04-09 DIAGNOSIS — C61 Malignant neoplasm of prostate: Secondary | ICD-10-CM | POA: Diagnosis not present

## 2016-04-09 DIAGNOSIS — C7951 Secondary malignant neoplasm of bone: Secondary | ICD-10-CM

## 2016-04-09 DIAGNOSIS — R339 Retention of urine, unspecified: Secondary | ICD-10-CM | POA: Diagnosis not present

## 2016-04-09 LAB — CBC WITH DIFFERENTIAL/PLATELET
BASO%: 0.3 % (ref 0.0–2.0)
BASOS ABS: 0 10*3/uL (ref 0.0–0.1)
EOS ABS: 0.2 10*3/uL (ref 0.0–0.5)
EOS%: 2.7 % (ref 0.0–7.0)
HEMATOCRIT: 41.4 % (ref 38.4–49.9)
HEMOGLOBIN: 14 g/dL (ref 13.0–17.1)
LYMPH%: 19.3 % (ref 14.0–49.0)
MCH: 32.2 pg (ref 27.2–33.4)
MCHC: 33.9 g/dL (ref 32.0–36.0)
MCV: 94.9 fL (ref 79.3–98.0)
MONO#: 0.9 10*3/uL (ref 0.1–0.9)
MONO%: 9.8 % (ref 0.0–14.0)
NEUT#: 6.1 10*3/uL (ref 1.5–6.5)
NEUT%: 67.9 % (ref 39.0–75.0)
Platelets: 173 10*3/uL (ref 140–400)
RBC: 4.36 10*6/uL (ref 4.20–5.82)
RDW: 14.9 % — AB (ref 11.0–14.6)
WBC: 9 10*3/uL (ref 4.0–10.3)
lymph#: 1.7 10*3/uL (ref 0.9–3.3)

## 2016-04-09 LAB — COMPREHENSIVE METABOLIC PANEL
ALBUMIN: 3.3 g/dL — AB (ref 3.5–5.0)
ALK PHOS: 67 U/L (ref 40–150)
ALT: 12 U/L (ref 0–55)
AST: 11 U/L (ref 5–34)
Anion Gap: 9 mEq/L (ref 3–11)
BUN: 46 mg/dL — ABNORMAL HIGH (ref 7.0–26.0)
CALCIUM: 10.8 mg/dL — AB (ref 8.4–10.4)
CHLORIDE: 104 meq/L (ref 98–109)
CO2: 29 mEq/L (ref 22–29)
Creatinine: 1.3 mg/dL (ref 0.7–1.3)
EGFR: 49 mL/min/{1.73_m2} — AB (ref >90)
Glucose: 105 mg/dl (ref 70–140)
POTASSIUM: 4.5 meq/L (ref 3.5–5.1)
SODIUM: 142 meq/L (ref 136–145)
Total Bilirubin: 0.52 mg/dL (ref 0.20–1.20)
Total Protein: 7.3 g/dL (ref 6.4–8.3)

## 2016-04-10 LAB — PSA: Prostate Specific Ag, Serum: 21.8 ng/mL — ABNORMAL HIGH (ref 0.0–4.0)

## 2016-04-11 DIAGNOSIS — I480 Paroxysmal atrial fibrillation: Secondary | ICD-10-CM | POA: Diagnosis not present

## 2016-04-13 ENCOUNTER — Encounter (HOSPITAL_COMMUNITY): Payer: Medicare Other

## 2016-04-13 ENCOUNTER — Encounter (HOSPITAL_COMMUNITY)
Admission: RE | Admit: 2016-04-13 | Discharge: 2016-04-13 | Disposition: A | Discharge status: Home / Self Care | Payer: Medicare Other | Source: Ambulatory Visit | Attending: Oncology | Admitting: Oncology

## 2016-04-13 DIAGNOSIS — C61 Malignant neoplasm of prostate: Secondary | ICD-10-CM | POA: Diagnosis not present

## 2016-04-13 DIAGNOSIS — C7951 Secondary malignant neoplasm of bone: Secondary | ICD-10-CM | POA: Insufficient documentation

## 2016-04-13 MED ORDER — TECHNETIUM TC 99M MEDRONATE IV KIT: 25.0000 | PACK | Freq: Once | INTRAVENOUS | Status: DC | PRN

## 2016-04-19 ENCOUNTER — Ambulatory Visit (INDEPENDENT_AMBULATORY_CARE_PROVIDER_SITE_OTHER): Payer: Medicare Other | Admitting: Internal Medicine

## 2016-04-19 DIAGNOSIS — I482 Chronic atrial fibrillation, unspecified: Secondary | ICD-10-CM

## 2016-04-19 DIAGNOSIS — Z5181 Encounter for therapeutic drug level monitoring: Secondary | ICD-10-CM

## 2016-04-19 LAB — POCT INR: INR: 1.8

## 2016-04-20 ENCOUNTER — Ambulatory Visit (HOSPITAL_BASED_OUTPATIENT_CLINIC_OR_DEPARTMENT_OTHER): Payer: Medicare Other | Admitting: Oncology

## 2016-04-20 ENCOUNTER — Telehealth: Payer: Self-pay | Admitting: Oncology

## 2016-04-20 VITALS — BP 187/78 | HR 69 | Temp 97.9°F | Resp 17 | Ht 72.0 in

## 2016-04-20 DIAGNOSIS — C61 Malignant neoplasm of prostate: Secondary | ICD-10-CM | POA: Diagnosis not present

## 2016-04-20 DIAGNOSIS — E291 Testicular hypofunction: Secondary | ICD-10-CM | POA: Diagnosis not present

## 2016-04-20 DIAGNOSIS — C7951 Secondary malignant neoplasm of bone: Secondary | ICD-10-CM

## 2016-04-20 NOTE — Telephone Encounter (Signed)
Appointments scheduled per 2/27 LOS. Patient given AVS report and calendars with future scheduled appointments. °

## 2016-04-20 NOTE — Progress Notes (Signed)
Hematology and Oncology Follow Up Visit  Jacayden Stvincent [id number] 1924/07/26 83 y.o. 04/20/2016 12:29 PM Mellody Dance, [name], DO   Principle Diagnosis: 83 year old with Castration resistant prostate cancer with metastatic disease to the bone. He was initially diagnosed in 2011 PSA of 19 and presented with advanced disease.   Prior Therapy: He is status post combined androgen deprivation with Lupron and Casodex with an excellent response initially and had a PSA nadir down to 1.67. Most recently he developed progression of disease and a PSA up to 5.94 in September of 2014. He is status post SRS treatment to the L4 completed on 02/13/2014. He is S/P stereotactic body radiotherapy to the iliac bone treatment completed in 02/2014. He is status post radiation therapy to the right femur completed on 06/07/2014.  Current therapy: He is on Xtandi started on 11/15/2012. He was started on 160 mg initially but the dose was reduced to 80 mg in December of 2014. Treatment has been on hold for 2 months. Xtandi resumed at 40 mg daily starting on 06/12/2014.  He is on Lupron and Xgeva done at [facility].  .  Interim History: [name] presents today for a followup visit with his wife. Since the last visit, he reports doing reasonably well without any decline in his health. His mobility remains limited but still able to sit in the chair and stand for short period of time. He is relying on his wife for most activities of daily living. He denied any recent falls or syncope. He denied any pathological fractures.  He denied any recent bone pain or change in his quality of life. He continues to have a Foley catheter in place which is changes every 4 weeks.  He continues to be on Xtandi of the current dose without any further decline in his energy or performance status. He denied any nausea or abdominal distention. His appetite have not really changed dramatically.    He does not  report any headaches, blurry vision, syncope or seizures. He does not report any chest pain, palpitation orthopnea. Has not reported any cough or hemoptysis or hematemesis. Does not report any nausea or vomiting or abdominal pain. Does not report any constipation or diarrhea. Does not report any hematochezia or melena. Does not report any skin rashes or lesions. Rest of his review of system is unremarkable.  Medications: I have reviewed the patient's current medications.  Current Outpatient Prescriptions  Medication Sig Dispense Refill  . acetaminophen (TYLENOL) 500 MG tablet Take 1,000 mg by mouth daily.     Marland Kitchen amLODipine (NORVASC) 10 MG tablet Take 1 tablet (10 mg total) by mouth daily. 90 tablet 1  . atorvastatin (LIPITOR) 10 MG tablet Take 1 tablet (10 mg total) by mouth daily. 30 tablet 11  . B Complex-C (B-COMPLEX WITH VITAMIN C) tablet Take 1 tablet by mouth daily.      . Denosumab (XGEVA [place]) Inject into the skin every 30 (thirty) days. Monthly. Last inj was 8-19- 2016. Wife not sure what dose it is    . docusate sodium (COLACE) 100 MG capsule Take 100 mg by mouth 2 (two) times daily.    Marland Kitchen ezetimibe (ZETIA) 10 MG tablet Take 1 tablet (10 mg total) by mouth daily. 90 tablet 2  . furosemide (LASIX) 40 MG tablet Take 1 tablet (40 mg total) by mouth daily. 90 tablet 2  . imiquimod (ALDARA) 5 % cream Apply 1 application topically 2 (two) times a week.     Marland Kitchen  Leuprolide Acetate (LUPRON DEPOT IM) Inject 1 each into the muscle every 6 (six) months. Last inj was in March 2016    . losartan (COZAAR) 100 MG tablet Take 1 tablet (100 mg total) by mouth daily. 90 tablet 2  . multivitamin (THERAGRAN) per tablet Take 1 tablet by mouth daily. ( with B - Complex )    . Omega-3 Fatty Acids (FISH OIL PO) Take 1 capsule by mouth daily. ( Mega Red )    . oxybutynin (DITROPAN) 5 MG tablet     . Probiotic Product (PROBIOTIC ADVANCED PO) Take 1 capsule by mouth daily.    . ranitidine (ZANTAC) 150 MG tablet Take 150  mg by mouth at bedtime.    . tamsulosin (FLOMAX) 0.4 MG CAPS capsule Take 1 capsule (0.4 mg total) by mouth daily. 30 capsule 2  . warfarin (COUMADIN) 5 MG tablet TAKE AS DIRECTED 30 tablet 3  . XTANDI 40 MG capsule TAKE 1 CAPSULE BY MOUTH ONCE DAILY 30 capsule 0   No current facility-administered medications for this visit.      Allergies:  Allergies  Allergen Reactions  . Pravachol Other (See Comments)    Muscle weakness  . Zocor [Simvastatin - High Dose] Other (See Comments)    Muscle weakness    Past Medical History, Surgical history, Social history, and Family History were reviewed and updated.   Physical Exam: Blood pressure (!) 187/78, pulse 69, temperature 97.9 F (36.6 C), temperature source Oral, resp. rate 17, height 6' (1.829 m), SpO2 98 %. ECOG: 2 General appearance: Well-appearing gentleman without distress. Head: Normocephalic, without obvious abnormality no oral ulcers or lesions. Neck: no adenopathy or masses. Lymph nodes: Cervical, supraclavicular, and axillary nodes normal. Heart:  Irregular rhythm without any murmurs or gallops. Lung:chest clear, no wheezing, rales, no dullness to percussion. Abdomin: soft, non-tender, without masses or organomegaly. No splenomegaly or ascites. EXT: No cyanosis or edema noted. Neurological examination: No deficits noted.  Lab Results: Lab Results  Component Value Date   WBC 9.0 04/09/2016   HGB 14.0 04/09/2016   HCT 41.4 04/09/2016   MCV 94.9 04/09/2016   PLT 173 04/09/2016     Chemistry      Component Value Date/Time   NA 142 04/09/2016 1116   K 4.5 04/09/2016 1116   CL 99 (L) 10/15/2015 2028   CO2 29 04/09/2016 1116   BUN 46.0 (H) 04/09/2016 1116   CREATININE 1.3 04/09/2016 1116      Component Value Date/Time   CALCIUM 10.8 (H) 04/09/2016 1116   ALKPHOS 67 04/09/2016 1116   AST 11 04/09/2016 1116   ALT 12 04/09/2016 1116   BILITOT 0.52 04/09/2016 1116     EXAM: NUCLEAR MEDICINE WHOLE BODY BONE  SCAN  TECHNIQUE: Whole body anterior and posterior images were obtained approximately 3 hours after intravenous injection of radiopharmaceutical.  RADIOPHARMACEUTICALS:  22.0 mCi Technetium-67m MDP IV  COMPARISON:  Multiple prior exams, most recent dated 10/10/2015.  FINDINGS: There are multiple small foci of increased radiotracer activity within the ribs. There are other small foci of uptake noted along the thoracolumbar spine, best seen posteriorly, and in the pelvis mostly along the left ilium and ischium. All these areas are stable from the most recent prior exam.  There are no new areas of abnormal radiotracer localization.  Stable areas of degenerative uptake are noted involving multiple joints.  Uptake from the right kidney with no significant left renal uptake is also stable.  IMPRESSION: 1. Multiple small areas  of abnormal uptake as described, without change from the most recent prior exam, consistent with stable metastatic disease to bone. 2. No evidence of progression of metastatic disease to bone.   Results for TANDON, LINVILLE (MRN [id number]) as of 04/20/2016 12:18  Ref. Range 01/20/2016 11:53 03/09/2016 11:46 04/09/2016 11:16  PSA Latest Ref Range: 0.0 - 4.0 ng/mL 21.1 (H) 21.0 (H) 21.8 (H)     Impression and Plan:  83 year old gentleman with the following issues:   1. Castration resistant prostate cancer with metastatic disease to the bone. He started on xtandi in September 2014 and have required dose reduction to 40 mg since that time.   His PSA from 04/09/2016 and bone scan obtained 04/13/2016 were reviewed and showed stable disease. His PSA have not changed dramatically since November 2017. His bone scan did not show any new lesions.  The plan is to continue with extended the current dose and schedule for the time being.  2. Androgen depravation: He is to continue Lupron at this time. He receives this under the care of Dr. Junious Silk. I have  recommended continuing this indefinitely.  3. Bone directed therapy: continues to be on Xgeva and have tolerated it well. No complications noted and I recommended continuing this.  4. Hypercalcemia: His calcium slightly increased but not dramatically changed. He continues to be on [place].  5. Bony metastasis: He have received radiation therapy to the lumbar spine as well as the right femur. No increased pain recently and repeat radiation therapy can be considered in other areas  6. Followup: in April 2018.  [facility], MD 2/27/201812:29 PM

## 2016-04-28 ENCOUNTER — Other Ambulatory Visit: Payer: Self-pay | Admitting: Oncology

## 2016-04-28 DIAGNOSIS — C61 Malignant neoplasm of prostate: Secondary | ICD-10-CM

## 2016-04-28 DIAGNOSIS — C7951 Secondary malignant neoplasm of bone: Secondary | ICD-10-CM

## 2016-05-03 ENCOUNTER — Ambulatory Visit (INDEPENDENT_AMBULATORY_CARE_PROVIDER_SITE_OTHER): Payer: Medicare Other | Admitting: Internal Medicine

## 2016-05-03 DIAGNOSIS — I482 Chronic atrial fibrillation, unspecified: Secondary | ICD-10-CM

## 2016-05-03 DIAGNOSIS — Z5181 Encounter for therapeutic drug level monitoring: Secondary | ICD-10-CM

## 2016-05-03 LAB — POCT INR: INR: 2.2

## 2016-05-10 ENCOUNTER — Other Ambulatory Visit: Payer: Self-pay | Admitting: Interventional Cardiology

## 2016-05-11 DIAGNOSIS — C61 Malignant neoplasm of prostate: Secondary | ICD-10-CM | POA: Diagnosis not present

## 2016-05-11 DIAGNOSIS — N139 Obstructive and reflux uropathy, unspecified: Secondary | ICD-10-CM | POA: Diagnosis not present

## 2016-05-11 DIAGNOSIS — C7951 Secondary malignant neoplasm of bone: Secondary | ICD-10-CM | POA: Diagnosis not present

## 2016-05-11 DIAGNOSIS — I480 Paroxysmal atrial fibrillation: Secondary | ICD-10-CM | POA: Diagnosis not present

## 2016-05-17 ENCOUNTER — Ambulatory Visit (INDEPENDENT_AMBULATORY_CARE_PROVIDER_SITE_OTHER): Payer: Medicare Other | Admitting: Cardiovascular Disease

## 2016-05-17 DIAGNOSIS — I482 Chronic atrial fibrillation, unspecified: Secondary | ICD-10-CM

## 2016-05-17 DIAGNOSIS — Z5181 Encounter for therapeutic drug level monitoring: Secondary | ICD-10-CM

## 2016-05-17 LAB — POCT INR: INR: 2

## 2016-05-31 ENCOUNTER — Ambulatory Visit (INDEPENDENT_AMBULATORY_CARE_PROVIDER_SITE_OTHER): Payer: Self-pay | Admitting: Internal Medicine

## 2016-05-31 ENCOUNTER — Other Ambulatory Visit: Payer: Self-pay | Admitting: Oncology

## 2016-05-31 DIAGNOSIS — I482 Chronic atrial fibrillation, unspecified: Secondary | ICD-10-CM

## 2016-05-31 DIAGNOSIS — C7951 Secondary malignant neoplasm of bone: Secondary | ICD-10-CM

## 2016-05-31 DIAGNOSIS — C61 Malignant neoplasm of prostate: Secondary | ICD-10-CM

## 2016-05-31 DIAGNOSIS — Z5181 Encounter for therapeutic drug level monitoring: Secondary | ICD-10-CM

## 2016-05-31 LAB — POCT INR: INR: 2.2

## 2016-06-07 ENCOUNTER — Telehealth: Payer: Self-pay | Admitting: Oncology

## 2016-06-07 NOTE — Telephone Encounter (Signed)
sw pt sife to confirm r/s 4/20 appt to 5/11 at 1130 per MD 4/20 CME

## 2016-06-08 DIAGNOSIS — L720 Epidermal cyst: Secondary | ICD-10-CM | POA: Diagnosis not present

## 2016-06-08 DIAGNOSIS — Z85820 Personal history of malignant melanoma of skin: Secondary | ICD-10-CM | POA: Diagnosis not present

## 2016-06-08 DIAGNOSIS — L218 Other seborrheic dermatitis: Secondary | ICD-10-CM | POA: Diagnosis not present

## 2016-06-08 DIAGNOSIS — Z85828 Personal history of other malignant neoplasm of skin: Secondary | ICD-10-CM | POA: Diagnosis not present

## 2016-06-08 DIAGNOSIS — L570 Actinic keratosis: Secondary | ICD-10-CM | POA: Diagnosis not present

## 2016-06-08 DIAGNOSIS — C7989 Secondary malignant neoplasm of other specified sites: Secondary | ICD-10-CM | POA: Diagnosis not present

## 2016-06-08 DIAGNOSIS — C434 Malignant melanoma of scalp and neck: Secondary | ICD-10-CM | POA: Diagnosis not present

## 2016-06-10 DIAGNOSIS — I480 Paroxysmal atrial fibrillation: Secondary | ICD-10-CM | POA: Diagnosis not present

## 2016-06-11 ENCOUNTER — Other Ambulatory Visit: Payer: Medicare Other

## 2016-06-11 ENCOUNTER — Ambulatory Visit: Payer: Medicare Other | Admitting: Oncology

## 2016-06-11 DIAGNOSIS — C7951 Secondary malignant neoplasm of bone: Secondary | ICD-10-CM | POA: Diagnosis not present

## 2016-06-11 DIAGNOSIS — R338 Other retention of urine: Secondary | ICD-10-CM | POA: Diagnosis not present

## 2016-06-11 DIAGNOSIS — C61 Malignant neoplasm of prostate: Secondary | ICD-10-CM | POA: Diagnosis not present

## 2016-06-14 ENCOUNTER — Ambulatory Visit (INDEPENDENT_AMBULATORY_CARE_PROVIDER_SITE_OTHER): Payer: Self-pay

## 2016-06-14 DIAGNOSIS — Z5181 Encounter for therapeutic drug level monitoring: Secondary | ICD-10-CM

## 2016-06-14 DIAGNOSIS — I482 Chronic atrial fibrillation, unspecified: Secondary | ICD-10-CM

## 2016-06-14 LAB — POCT INR: INR: 2

## 2016-06-19 ENCOUNTER — Other Ambulatory Visit: Payer: Self-pay | Admitting: Interventional Cardiology

## 2016-06-23 DIAGNOSIS — C434 Malignant melanoma of scalp and neck: Secondary | ICD-10-CM | POA: Diagnosis not present

## 2016-06-28 ENCOUNTER — Ambulatory Visit (INDEPENDENT_AMBULATORY_CARE_PROVIDER_SITE_OTHER): Payer: Medicare Other | Admitting: Cardiovascular Disease

## 2016-06-28 DIAGNOSIS — I482 Chronic atrial fibrillation, unspecified: Secondary | ICD-10-CM

## 2016-06-28 DIAGNOSIS — Z5181 Encounter for therapeutic drug level monitoring: Secondary | ICD-10-CM

## 2016-06-28 LAB — POCT INR: INR: 2

## 2016-07-01 ENCOUNTER — Other Ambulatory Visit: Payer: Self-pay | Admitting: Oncology

## 2016-07-01 DIAGNOSIS — C7951 Secondary malignant neoplasm of bone: Secondary | ICD-10-CM

## 2016-07-01 DIAGNOSIS — C61 Malignant neoplasm of prostate: Secondary | ICD-10-CM

## 2016-07-02 ENCOUNTER — Telehealth: Payer: Self-pay | Admitting: Oncology

## 2016-07-02 ENCOUNTER — Ambulatory Visit (HOSPITAL_BASED_OUTPATIENT_CLINIC_OR_DEPARTMENT_OTHER): Payer: Medicare Other | Admitting: Oncology

## 2016-07-02 ENCOUNTER — Other Ambulatory Visit (HOSPITAL_BASED_OUTPATIENT_CLINIC_OR_DEPARTMENT_OTHER): Payer: Medicare Other

## 2016-07-02 VITALS — BP 172/51 | HR 56 | Temp 97.9°F | Resp 19 | Ht 72.0 in

## 2016-07-02 DIAGNOSIS — E291 Testicular hypofunction: Secondary | ICD-10-CM

## 2016-07-02 DIAGNOSIS — C61 Malignant neoplasm of prostate: Secondary | ICD-10-CM | POA: Diagnosis not present

## 2016-07-02 DIAGNOSIS — C7951 Secondary malignant neoplasm of bone: Secondary | ICD-10-CM

## 2016-07-02 DIAGNOSIS — C434 Malignant melanoma of scalp and neck: Secondary | ICD-10-CM | POA: Diagnosis not present

## 2016-07-02 LAB — COMPREHENSIVE METABOLIC PANEL
ALK PHOS: 65 U/L (ref 40–150)
ALT: 11 U/L (ref 0–55)
ANION GAP: 8 meq/L (ref 3–11)
AST: 13 U/L (ref 5–34)
Albumin: 3.3 g/dL — ABNORMAL LOW (ref 3.5–5.0)
BUN: 43.2 mg/dL — ABNORMAL HIGH (ref 7.0–26.0)
CALCIUM: 10.4 mg/dL (ref 8.4–10.4)
CO2: 29 mEq/L (ref 22–29)
CREATININE: 1.4 mg/dL — AB (ref 0.7–1.3)
Chloride: 104 mEq/L (ref 98–109)
EGFR: 46 mL/min/{1.73_m2} — ABNORMAL LOW (ref >90)
Glucose: 124 mg/dl (ref 70–140)
Potassium: 4.4 mEq/L (ref 3.5–5.1)
Sodium: 141 mEq/L (ref 136–145)
Total Bilirubin: 0.56 mg/dL (ref 0.20–1.20)
Total Protein: 7.3 g/dL (ref 6.4–8.3)

## 2016-07-02 LAB — CBC WITH DIFFERENTIAL/PLATELET
BASO%: 0.7 % (ref 0.0–2.0)
BASOS ABS: 0.1 10*3/uL (ref 0.0–0.1)
EOS%: 3.7 % (ref 0.0–7.0)
Eosinophils Absolute: 0.3 10*3/uL (ref 0.0–0.5)
HEMATOCRIT: 42.7 % (ref 38.4–49.9)
HGB: 14.3 g/dL (ref 13.0–17.1)
LYMPH#: 1.8 10*3/uL (ref 0.9–3.3)
LYMPH%: 21 % (ref 14.0–49.0)
MCH: 32.1 pg (ref 27.2–33.4)
MCHC: 33.4 g/dL (ref 32.0–36.0)
MCV: 96 fL (ref 79.3–98.0)
MONO#: 0.8 10*3/uL (ref 0.1–0.9)
MONO%: 9.7 % (ref 0.0–14.0)
NEUT#: 5.5 10*3/uL (ref 1.5–6.5)
NEUT%: 64.9 % (ref 39.0–75.0)
PLATELETS: 164 10*3/uL (ref 140–400)
RBC: 4.44 10*6/uL (ref 4.20–5.82)
RDW: 15.1 % — ABNORMAL HIGH (ref 11.0–14.6)
WBC: 8.5 10*3/uL (ref 4.0–10.3)

## 2016-07-02 NOTE — Progress Notes (Signed)
Hematology and Oncology Follow Up Visit  Craig [name] [id number] [birth year] 83 y.o. 07/02/2016 12:23 PM [name], [name], DO   Principle Diagnosis: 83 year old with Castration resistant prostate cancer with metastatic disease to the bone. He was initially diagnosed in 2011 PSA of 19 and presented with advanced disease.   Prior Therapy: He is status post combined androgen deprivation with Lupron and Casodex with an excellent response initially and had a PSA nadir down to 1.67. Most recently he developed progression of disease and a PSA up to 5.94 in September of 2014. He is status post SRS treatment to the L4 completed on 02/13/2014. He is S/P stereotactic body radiotherapy to the iliac bone treatment completed in 02/2014. He is status post radiation therapy to the right femur completed on 06/07/2014.  Current therapy: He is on Xtandi started on 11/15/2012. He was started on 160 mg initially but the dose was reduced to 80 mg in December of 2014. Treatment has been on hold for 2 months. Xtandi resumed at 40 mg daily starting on 06/12/2014.  He is on Lupron and Xgeva done at [facility].  .  Interim History: Craig [name] presents today for a followup visit with his wife. Since the last visit, he reports no major changes in his health. He did have a skin lesion that was initially biopsied on 06/08/2016 which showed melanoma. A repeat biopsy done on 06/23/2016 showed margins to be free with depth of invasion about 3.3 mm. He reports no other skin lesions or rashes.  He continues to be on Xtandi of the current dose without any further decline in his energy or performance status. He denied any nausea or abdominal distention. His appetite have not really changed dramatically. He denied any recent hospitalization or illnesses. He continues to have a Foley catheter in place that is replaced on a monthly basis. His mobility still limited and he is for the most part chair  confined. He denied any recent back pain, shoulder pain or any increased bone pain.   He does not report any headaches, blurry vision, syncope or seizures. He does not report any chest pain, palpitation orthopnea. Has not reported any cough or hemoptysis or hematemesis. Does not report any nausea or vomiting or abdominal pain. Does not report any constipation or diarrhea. Does not report any hematochezia or melena. Does not report any skin rashes or lesions. Rest of his review of system is unremarkable.  Medications: I have reviewed the patient's current medications.  Current Outpatient Prescriptions  Medication Sig Dispense Refill  . acetaminophen (TYLENOL) 500 MG tablet Take 1,000 mg by mouth daily.     Marland Kitchen amLODipine (NORVASC) 10 MG tablet Take 1 tablet (10 mg total) by mouth daily. 90 tablet 1  . atorvastatin (LIPITOR) 10 MG tablet Take 1 tablet (10 mg total) by mouth daily. 30 tablet 11  . B Complex-C (B-COMPLEX WITH VITAMIN C) tablet Take 1 tablet by mouth daily.      . Denosumab (XGEVA [place]) Inject into the skin every 30 (thirty) days. Monthly. Last inj was 8-19- 2016. Wife not sure what dose it is    . docusate sodium (COLACE) 100 MG capsule Take 100 mg by mouth 2 (two) times daily.    Marland Kitchen ezetimibe (ZETIA) 10 MG tablet Take 1 tablet (10 mg total) by mouth daily. 90 tablet 2  . furosemide (LASIX) 40 MG tablet Take 1 tablet (40 mg total) by mouth daily. 90 tablet 2  . imiquimod (ALDARA)  5 % cream Apply 1 application topically 2 (two) times a week.     Marland Kitchen Leuprolide Acetate (LUPRON DEPOT IM) Inject 1 each into the muscle every 6 (six) months. Last inj was in March 2016    . losartan (COZAAR) 100 MG tablet TAKE 1 TABLET BY MOUTH DAILY 90 tablet 2  . multivitamin (THERAGRAN) per tablet Take 1 tablet by mouth daily. ( with B - Complex )    . mupirocin ointment (BACTROBAN) 2 %     . Omega-3 Fatty Acids (FISH OIL PO) Take 1 capsule by mouth daily. ( Mega Red )    . oxybutynin (DITROPAN) 5 MG tablet      . Probiotic Product (PROBIOTIC ADVANCED PO) Take 1 capsule by mouth daily.    . ranitidine (ZANTAC) 150 MG tablet Take 150 mg by mouth at bedtime.    . tamsulosin (FLOMAX) 0.4 MG CAPS capsule Take 1 capsule (0.4 mg total) by mouth daily. 30 capsule 2  . warfarin (COUMADIN) 5 MG tablet TAKE AS DIRECTED (Patient taking differently: TAKE AS DIRECTED 5 mg x 2 and 2.5 mg x 5 days) 30 tablet 2  . XTANDI 40 MG capsule TAKE 1 CAPSULE BY MOUTH ONCE DAILY 30 capsule 0   No current facility-administered medications for this visit.      Allergies:  Allergies  Allergen Reactions  . Pravachol Other (See Comments)    Muscle weakness  . Zocor [Simvastatin - High Dose] Other (See Comments)    Muscle weakness    Past Medical History, Surgical history, Social history, and Family History were reviewed and updated.   Physical Exam: Blood pressure (!) 172/51, pulse (!) 56, temperature 97.9 F (36.6 C), resp. rate 19, height 6' (1.829 m), SpO2 98 %. ECOG: 2 General appearance: Alert, awake gentleman appeared without distress. Head: Normocephalic, without obvious abnormality. Scalp lesion noted without any nodularity. Neck: no adenopathy or masses. Lymph nodes: Cervical, supraclavicular, and axillary nodes normal. Heart:  Irregular rhythm without any murmurs or gallops. Lung:chest clear, no wheezing, rales, no dullness to percussion. Abdomin: soft, non-tender, without masses or organomegaly. No shifting dullness or ascites. EXT: No cyanosis or edema noted. Neurological examination: No deficits noted.  Lab Results: Lab Results  Component Value Date   WBC 8.5 07/02/2016   HGB 14.3 07/02/2016   HCT 42.7 07/02/2016   MCV 96.0 07/02/2016   PLT 164 07/02/2016     Chemistry      Component Value Date/Time   NA 142 04/09/2016 1116   K 4.5 04/09/2016 1116   CL 99 (L) 10/15/2015 2028   CO2 29 04/09/2016 1116   BUN 46.0 (H) 04/09/2016 1116   CREATININE 1.3 04/09/2016 1116      Component Value  Date/Time   CALCIUM 10.8 (H) 04/09/2016 1116   ALKPHOS 67 04/09/2016 1116   AST 11 04/09/2016 1116   ALT 12 04/09/2016 1116   BILITOT 0.52 04/09/2016 1116      Results for BRIAR, WITHERSPOON (MRN [id number]) as of 04/20/2016 12:18  Ref. Range 01/20/2016 11:53 03/09/2016 11:46 04/09/2016 11:16  PSA Latest Ref Range: 0.0 - 4.0 ng/mL 21.1 (H) 21.0 (H) 21.8 (H)     Impression and Plan:  83 year old gentleman with the following issues:   1. Castration resistant prostate cancer with metastatic disease to the bone. He started on xtandi in September 2014 and have required dose reduction to 40 mg since that time.   His PSA from 04/09/2016 and bone scan obtained 04/13/2016 were  reviewed and showed stable disease. His PSA have not changed dramatically since November 2017. His bone scan did not show any new lesions.  Risks and benefits of continuing Xtandi were reviewed today is agreeable to continue. Different salvage regimen will be used if he develops symptomatic progression. This will include Zytiga versus chemotherapy. Likely he will use Zytiga given his poor performance status and anticipated poor tolerance to chemotherapy.  2. Androgen depravation: He is to continue Lupron at this time. He receives this under the care of Dr. Junious Silk. I have recommended continuing this indefinitely.  3. Bone directed therapy: continues to be on Xgeva and have tolerated it well. No complications noted and I recommended continuing this.  4. Hypercalcemia: His calcium on 07/02/2016 is within normal range.  5. Bony metastasis: He have received radiation therapy to the lumbar spine as well as the right femur. No increased pain recently and repeat radiation therapy can be considered in other areas  6. 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 He is at risk of developing metastatic disease. Given his history of prostate cancer and advanced age, we will continue to monitor at this time and is immune  therapy only if he develops symptomatic metastatic melanoma.  7. Followup: in June 2018.  [facility], MD 5/11/201812:23 PM

## 2016-07-02 NOTE — Telephone Encounter (Signed)
Appointments scheduled per 07/02/16 los. Patient was given a copy of the AVS report and appointment schedule per 07/02/16 los. °

## 2016-07-03 LAB — PSA: Prostate Specific Ag, Serum: 29.8 ng/mL — ABNORMAL HIGH (ref 0.0–4.0)

## 2016-07-05 ENCOUNTER — Telehealth: Payer: Self-pay | Admitting: *Deleted

## 2016-07-05 NOTE — Telephone Encounter (Signed)
-----   Message from Wyatt Portela, MD sent at 07/05/2016  8:54 AM EDT ----- Please let let his wife know his PSA went up. The plan is to continue [place] for now.

## 2016-07-05 NOTE — Telephone Encounter (Signed)
Spoke with wife betty. Gave results of last PSA. Per dr Alen Blew, continue with xtandi

## 2016-07-12 ENCOUNTER — Ambulatory Visit (INDEPENDENT_AMBULATORY_CARE_PROVIDER_SITE_OTHER): Payer: Medicare Other | Admitting: Internal Medicine

## 2016-07-12 DIAGNOSIS — I482 Chronic atrial fibrillation, unspecified: Secondary | ICD-10-CM

## 2016-07-12 DIAGNOSIS — Z5181 Encounter for therapeutic drug level monitoring: Secondary | ICD-10-CM

## 2016-07-12 LAB — POCT INR: INR: 1.9

## 2016-07-13 ENCOUNTER — Encounter: Payer: Self-pay | Admitting: *Deleted

## 2016-07-13 ENCOUNTER — Telehealth: Payer: Self-pay | Admitting: *Deleted

## 2016-07-13 DIAGNOSIS — C61 Malignant neoplasm of prostate: Secondary | ICD-10-CM | POA: Diagnosis not present

## 2016-07-13 DIAGNOSIS — I480 Paroxysmal atrial fibrillation: Secondary | ICD-10-CM | POA: Diagnosis not present

## 2016-07-13 DIAGNOSIS — C7951 Secondary malignant neoplasm of bone: Secondary | ICD-10-CM | POA: Diagnosis not present

## 2016-07-13 DIAGNOSIS — R338 Other retention of urine: Secondary | ICD-10-CM | POA: Diagnosis not present

## 2016-07-13 NOTE — Telephone Encounter (Signed)
"  Craig [name] is scheduled to see Dr. Alger [name] today.  They meed his lab results for today's Lupron injection.  [name] fax.  [phone]."   07-02-2016 lab results routed via EPIC.

## 2016-07-13 NOTE — Progress Notes (Signed)
Faxed last labs to dr [facility], attn: [email or web address], per wife betty's request.

## 2016-07-19 NOTE — Progress Notes (Signed)
Patient ID: Craig [name]., male   DOB: [birth year], 83 y.o.   MRN: [id number]     Cardiology Office Note   Date:  07/21/2016   ID:  Craig [name]., DOB 1924-03-26, MRN [id number]  PCP:  Craig Dance, DO    Chief Complaint  Patient presents with  . Follow-up  f/u atrial fibrillation   Wt Readings from Last 3 Encounters:  07/15/15 200 lb (90.7 kg)  04/14/15 200 lb (90.7 kg)  02/07/15 200 lb (90.7 kg)       History of Present Illness: Craig [name]. is a 83 y.o. 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  He has seen Dr. Mare [name] in the past. The patient was hospitalized in 2016 after patient had coughed up blood as noticed by patient's wife. . In the ER CT angiogram shows chronic pulmonary embolism. He was treated with IV heparin and then transition back to Coumadin and was discharged on Coumadin.  Now his Coumadin is checked at home. Range is 1.8 to 2.2.   The patient is in chronic atrial fibrillation.  He was hospitalized overnight from 12/31/14 until 01/01/15 because of a TIA. This occurred when his INR was 1.7. No changes were made in his regimen.   He is on XTandi for his metastatic prostate cancer. He gets some swelling from the prostate cancer medication.  Improves with leg elevation.  PSA has increased to 29.8.  THey are continuing with the current meds.  THey have not used any additional Lasix since the last visit.  He has had some SHOB which lasts for a few minutes at a time; it can happen while he is asleep but resolves on its own.  Wife keeps track of urine output.  It has been stable.  No extra leg swelling.  He is now doing home COumadin checks.     BP has been higher than normal of late. It is better after he sits for a while.  They have not  been checking blood pressure at home is much.  Denies : Chest pain. Dizziness. Nitroglycerin use. Orthopnea. Palpitations.  Syncope.        Past Medical History:  Diagnosis Date  . Atrial fibrillation ([place])   . Chronic back pain   . GERD (gastroesophageal reflux disease)   . H/O hiatal hernia   . History of epistaxis 07/19/2002  . Hyperlipidemia   . Hypertension   . Hypertensive heart disease   . Hypokalemia   . ICH (intracerebral hemorrhage) ([place]) ~ 1999   after TPA/notes 09/27/2012  . Melanoma ([place])    "top of my head" (09/27/2012)  . Migraines    "migraines without headaches years ago" (09/27/2012)  . Osteoarthritis of both feet   . PAT (paroxysmal atrial tachycardia) ([place])   . Personal history of long-term (current) use of anticoagulants   . Pneumonia    "once; several years ago" (09/27/2012)  . Prostate cancer, primary, with metastasis from prostate to other site [facility])    on Lupron injections per GU  . S/P radiation therapy 02/13/14-02/27/14   SRS 5/5 spine completed 02/27/14  . S/P radiation therapy 05/27/14-05/31/14   right femur 20Gy/45fx  . Stroke [facility]) ~ 1999   ischemic / right cerebellar/posterior inferior cerebellar artery / right pos infarct    Past Surgical History:  Procedure Laterality  Date  . CATARACT EXTRACTION W/ INTRAOCULAR LENS  IMPLANT, BILATERAL Bilateral ~ 2011  . MELANOMA EXCISION  2010   "pre-melanoma on top of head; did skin graft from left thigh to cover" (09/27/2012)  . NASAL SINUS SURGERY  1998  . SKIN GRAFT Right 2010  . TONSILLECTOMY  ~ 1935     Current Outpatient Prescriptions  Medication Sig Dispense Refill  . acetaminophen (TYLENOL) 500 MG tablet Take 1,000 mg by mouth daily.     Marland Kitchen amLODipine (NORVASC) 10 MG tablet Take 1 tablet (10 mg total) by mouth daily. 90 tablet 1  . atorvastatin (LIPITOR) 10 MG tablet Take 1 tablet (10 mg total) by mouth daily. 30 tablet 11  . B Complex-C (B-COMPLEX WITH VITAMIN C) tablet Take 1 tablet by mouth daily.       . Denosumab (XGEVA [place]) Inject into the skin every 30 (thirty) days. Monthly. Last inj was 8-19- 2016. Wife not sure what dose it is    . docusate sodium (COLACE) 100 MG capsule Take 100 mg by mouth 2 (two) times daily.    Marland Kitchen ezetimibe (ZETIA) 10 MG tablet Take 1 tablet (10 mg total) by mouth daily. 90 tablet 2  . furosemide (LASIX) 40 MG tablet Take 1 tablet (40 mg total) by mouth daily. 90 tablet 2  . imiquimod (ALDARA) 5 % cream Apply 1 application topically 2 (two) times a week.     Marland Kitchen Leuprolide Acetate (LUPRON DEPOT IM) Inject 1 each into the muscle every 6 (six) months. Last inj was in March 2016    . losartan (COZAAR) 100 MG tablet TAKE 1 TABLET BY MOUTH DAILY 90 tablet 2  . multivitamin (THERAGRAN) per tablet Take 1 tablet by mouth daily. ( with B - Complex )    . mupirocin ointment (BACTROBAN) 2 %     . Omega-3 Fatty Acids (FISH OIL PO) Take 1 capsule by mouth daily. ( Mega Red )    . oxybutynin (DITROPAN) 5 MG tablet     . Probiotic Product (PROBIOTIC ADVANCED PO) Take 1 capsule by mouth daily.    . ranitidine (ZANTAC) 150 MG tablet Take 150 mg by mouth at bedtime.    Marland Kitchen warfarin (COUMADIN) 5 MG tablet TAKE AS DIRECTED (Patient taking differently: TAKE AS DIRECTED 5 mg x 2 and 2.5 mg x 5 days) 30 tablet 2  . XTANDI 40 MG capsule TAKE 1 CAPSULE BY MOUTH ONCE DAILY 30 capsule 0   No current facility-administered medications for this visit.     Allergies:   Pravachol and Zocor [simvastatin - high dose]    Social History:  The patient  reports that he quit smoking about 63 years ago. His smoking use included Cigarettes. He quit after 0.50 years of use. He has never used smokeless tobacco. He reports that he does not drink alcohol or use drugs.   Family History:  The patient's family history includes Heart attack in his father; Heart failure in his mother.    ROS:  Please see the history of present illness.   Otherwise, review of systems are positive for Occasional shortness of  breath.   All other systems are reviewed and negative.    PHYSICAL EXAM: VS:  BP (!) 158/70   Pulse 68   Ht 6' (1.829 m)  , BMI There is no height or weight on file to calculate BMI. GEN: Frail in wheelchair, in no acute distress  HEENT: normal  Neck: no JVD, carotid bruits, or masses  Cardiac: Irregularly irregular; no murmurs, rubs, or gallops,; bilateral ankle edema  Respiratory:  clear to auscultation bilaterally, normal work of breathing GI: soft, nontender, nondistended,  MS: no deformity or atrophy  Skin: warm and dry, no rash Neuro:  Strength and sensation are intact Psych: euthymic mood, full affect      Recent Labs: 07/02/2016: ALT 11; BUN 43.2; Creatinine 1.4; HGB 14.3; Platelets 164; Potassium 4.4; Sodium 141   Lipid Panel    Component Value Date/Time   CHOL 137 07/15/2015 1156   TRIG 132 07/15/2015 1156   HDL 56 07/15/2015 1156   CHOLHDL 2.4 07/15/2015 1156   VLDL 26 07/15/2015 1156   LDLCALC 55 07/15/2015 1156     Other studies Reviewed: Additional studies/ records that were reviewed today with results demonstrating: Echo from 11/16: EF 35%, mild AS. Lipids controlled in 5/17.   ASSESSMENT AND PLAN:  1. AFib: Rate controlled. No palpitations. Warfarin for stroke prevention.   2. Hypertensive heart disease with chronic systolic and  diastolic heart failure: Some symptoms of shortness of breath. Will have patient take extra doses of Lasix for 2-3 days this week. I have given the flexibility of the wife to give extra furosemide up to 2 days a week if needed. They will contact us if symptoms worsen.  Extra diuresis may also help his blood pressure readings. 3. CKD stage III: Check electrolytes today. 4. Aortic atherosclerosis.: Known small aortic aneurysm in the past. He is not interested in any further follow-up to his overall condition. 5. Mild AS: Murmur persists on exam. No symptoms of severe aortic stenosis. 6.  Prior pulmonary embolism with hemoptysis.  No recent bleeding noted.   Current medicines are reviewed at length with the patient today.  The patient concerns regarding his medicines were addressed.  The following changes have been made:  Increase Lasix for a few days  Labs/ tests ordered today include:  No orders of the defined types were placed in this encounter.   Recommend 150 minutes/week of aerobic exercise Low fat, low carb, high fiber diet recommended  Disposition:   FU in 6 months   Signed, Larae Grooms, MD  07/21/2016 11:14 AM    [facility] [place], [place], [place]  [address] Phone: [phone]; Fax: [phone]

## 2016-07-21 ENCOUNTER — Encounter: Payer: Self-pay | Admitting: Interventional Cardiology

## 2016-07-21 ENCOUNTER — Ambulatory Visit (INDEPENDENT_AMBULATORY_CARE_PROVIDER_SITE_OTHER): Payer: Medicare Other | Admitting: Interventional Cardiology

## 2016-07-21 VITALS — BP 158/70 | HR 68 | Ht 72.0 in

## 2016-07-21 DIAGNOSIS — N289 Disorder of kidney and ureter, unspecified: Secondary | ICD-10-CM | POA: Diagnosis not present

## 2016-07-21 DIAGNOSIS — E7800 Pure hypercholesterolemia, unspecified: Secondary | ICD-10-CM | POA: Diagnosis not present

## 2016-07-21 DIAGNOSIS — I5042 Chronic combined systolic (congestive) and diastolic (congestive) heart failure: Secondary | ICD-10-CM | POA: Diagnosis not present

## 2016-07-21 DIAGNOSIS — I110 Hypertensive heart disease with heart failure: Secondary | ICD-10-CM

## 2016-07-21 DIAGNOSIS — I482 Chronic atrial fibrillation, unspecified: Secondary | ICD-10-CM

## 2016-07-21 LAB — COMPREHENSIVE METABOLIC PANEL
A/G RATIO: 1.3 (ref 1.2–2.2)
ALT: 7 IU/L (ref 0–44)
AST: 11 IU/L (ref 0–40)
Albumin: 3.9 g/dL (ref 3.5–4.7)
Alkaline Phosphatase: 65 IU/L (ref 39–117)
BILIRUBIN TOTAL: 0.6 mg/dL (ref 0.0–1.2)
BUN/Creatinine Ratio: 30 — ABNORMAL HIGH (ref 10–24)
BUN: 37 mg/dL — ABNORMAL HIGH (ref 8–27)
CALCIUM: 10.6 mg/dL — AB (ref 8.6–10.2)
CHLORIDE: 100 mmol/L (ref 96–106)
CO2: 25 mmol/L (ref 18–29)
Creatinine, Ser: 1.24 mg/dL (ref 0.76–1.27)
GFR calc Af Amer: 59 mL/min/{1.73_m2} — ABNORMAL LOW (ref >59)
GFR calc non Af Amer: 51 mL/min/{1.73_m2} — ABNORMAL LOW (ref >59)
GLUCOSE: 105 mg/dL — AB (ref 65–99)
Globulin, Total: 2.9 g/dL (ref 1.5–4.5)
POTASSIUM: 4.1 mmol/L (ref 3.5–5.2)
Sodium: 141 mmol/L (ref 134–144)
Total Protein: 6.8 g/dL (ref 6.0–8.5)

## 2016-07-21 LAB — LIPID PANEL
CHOLESTEROL TOTAL: 161 mg/dL (ref 100–199)
Chol/HDL Ratio: 3.3 ratio (ref 0.0–5.0)
HDL: 49 mg/dL (ref >39)
LDL Calculated: 81 mg/dL (ref 0–99)
Triglycerides: 157 mg/dL — ABNORMAL HIGH (ref 0–149)
VLDL Cholesterol Cal: 31 mg/dL (ref 5–40)

## 2016-07-21 MED ORDER — LOSARTAN POTASSIUM 100 MG PO TABS: 100.0000 mg | ORAL_TABLET | Freq: Every day | ORAL | 3 refills | Status: DC

## 2016-07-21 MED ORDER — AMLODIPINE BESYLATE 10 MG PO TABS: 10.0000 mg | ORAL_TABLET | Freq: Every day | ORAL | 3 refills | Status: DC

## 2016-07-21 MED ORDER — ATORVASTATIN CALCIUM 10 MG PO TABS: 10.0000 mg | ORAL_TABLET | Freq: Every day | ORAL | 3 refills | Status: DC

## 2016-07-21 MED ORDER — FUROSEMIDE 40 MG PO TABS: 40.0000 mg | ORAL_TABLET | Freq: Every day | ORAL | 3 refills | Status: DC

## 2016-07-21 MED ORDER — EZETIMIBE 10 MG PO TABS: 10.0000 mg | ORAL_TABLET | Freq: Every day | ORAL | 3 refills | Status: DC

## 2016-07-21 NOTE — Patient Instructions (Addendum)
Medication Instructions:  Your physician has recommended you make the following change in your medication:   Take 80 mg of lasix for the next 3 days.  You may take an additional 40 mg of lasix daily for swelling or shortness of breath.    Labwork: LABS TODAY: CMET, LIPIDS  Testing/Procedures: None ordered  Follow-Up: Your physician wants you to follow-up in: 6 months with Dr. Irish Lack. You will receive a reminder letter in the mail two months in advance. If you don't receive a letter, please call our office to schedule the follow-up appointment.   Any Other Special Instructions Will Be Listed Below (If Applicable).     If you need a refill on your cardiac medications before your next appointment, please call your pharmacy.

## 2016-07-23 ENCOUNTER — Other Ambulatory Visit: Payer: Self-pay | Admitting: Oncology

## 2016-07-23 ENCOUNTER — Ambulatory Visit: Payer: Medicare Other | Admitting: Interventional Cardiology

## 2016-07-23 DIAGNOSIS — C61 Malignant neoplasm of prostate: Secondary | ICD-10-CM

## 2016-07-23 DIAGNOSIS — C7951 Secondary malignant neoplasm of bone: Secondary | ICD-10-CM

## 2016-07-26 ENCOUNTER — Ambulatory Visit (INDEPENDENT_AMBULATORY_CARE_PROVIDER_SITE_OTHER): Payer: Medicare Other | Admitting: Internal Medicine

## 2016-07-26 DIAGNOSIS — I482 Chronic atrial fibrillation, unspecified: Secondary | ICD-10-CM

## 2016-07-26 DIAGNOSIS — Z5181 Encounter for therapeutic drug level monitoring: Secondary | ICD-10-CM

## 2016-07-26 LAB — POCT INR: INR: 1.9

## 2016-08-09 LAB — POCT INR: INR: 2.2

## 2016-08-10 ENCOUNTER — Ambulatory Visit (INDEPENDENT_AMBULATORY_CARE_PROVIDER_SITE_OTHER): Payer: Self-pay | Admitting: Cardiology

## 2016-08-10 DIAGNOSIS — I482 Chronic atrial fibrillation, unspecified: Secondary | ICD-10-CM

## 2016-08-10 DIAGNOSIS — Z5181 Encounter for therapeutic drug level monitoring: Secondary | ICD-10-CM

## 2016-08-11 ENCOUNTER — Ambulatory Visit (INDEPENDENT_AMBULATORY_CARE_PROVIDER_SITE_OTHER): Payer: Medicare Other | Admitting: Podiatry

## 2016-08-11 ENCOUNTER — Encounter: Payer: Self-pay | Admitting: Podiatry

## 2016-08-11 DIAGNOSIS — B351 Tinea unguium: Secondary | ICD-10-CM

## 2016-08-11 DIAGNOSIS — G609 Hereditary and idiopathic neuropathy, unspecified: Secondary | ICD-10-CM | POA: Diagnosis not present

## 2016-08-12 ENCOUNTER — Telehealth: Payer: Self-pay | Admitting: Oncology

## 2016-08-12 ENCOUNTER — Other Ambulatory Visit (HOSPITAL_BASED_OUTPATIENT_CLINIC_OR_DEPARTMENT_OTHER): Payer: Medicare Other

## 2016-08-12 ENCOUNTER — Ambulatory Visit (HOSPITAL_BASED_OUTPATIENT_CLINIC_OR_DEPARTMENT_OTHER): Payer: Medicare Other | Admitting: Oncology

## 2016-08-12 VITALS — BP 174/72 | HR 65 | Temp 97.7°F | Resp 20 | Ht 72.0 in

## 2016-08-12 DIAGNOSIS — C434 Malignant melanoma of scalp and neck: Secondary | ICD-10-CM

## 2016-08-12 DIAGNOSIS — R338 Other retention of urine: Secondary | ICD-10-CM | POA: Diagnosis not present

## 2016-08-12 DIAGNOSIS — E291 Testicular hypofunction: Secondary | ICD-10-CM

## 2016-08-12 DIAGNOSIS — C61 Malignant neoplasm of prostate: Secondary | ICD-10-CM

## 2016-08-12 DIAGNOSIS — I480 Paroxysmal atrial fibrillation: Secondary | ICD-10-CM | POA: Diagnosis not present

## 2016-08-12 DIAGNOSIS — C7951 Secondary malignant neoplasm of bone: Secondary | ICD-10-CM

## 2016-08-12 LAB — CBC WITH DIFFERENTIAL/PLATELET
BASO%: 0.2 % (ref 0.0–2.0)
BASOS ABS: 0 10*3/uL (ref 0.0–0.1)
EOS ABS: 0.4 10*3/uL (ref 0.0–0.5)
EOS%: 3.6 % (ref 0.0–7.0)
HCT: 42.9 % (ref 38.4–49.9)
HGB: 14.1 g/dL (ref 13.0–17.1)
LYMPH%: 21.4 % (ref 14.0–49.0)
MCH: 31.9 pg (ref 27.2–33.4)
MCHC: 32.9 g/dL (ref 32.0–36.0)
MCV: 97.1 fL (ref 79.3–98.0)
MONO#: 1 10*3/uL — AB (ref 0.1–0.9)
MONO%: 9.6 % (ref 0.0–14.0)
NEUT#: 6.6 10*3/uL — ABNORMAL HIGH (ref 1.5–6.5)
NEUT%: 65.2 % (ref 39.0–75.0)
PLATELETS: 160 10*3/uL (ref 140–400)
RBC: 4.42 10*6/uL (ref 4.20–5.82)
RDW: 14.6 % (ref 11.0–14.6)
WBC: 10.1 10*3/uL (ref 4.0–10.3)
lymph#: 2.2 10*3/uL (ref 0.9–3.3)

## 2016-08-12 LAB — COMPREHENSIVE METABOLIC PANEL
ALBUMIN: 3.2 g/dL — AB (ref 3.5–5.0)
ALK PHOS: 69 U/L (ref 40–150)
ALT: 10 U/L (ref 0–55)
ANION GAP: 10 meq/L (ref 3–11)
AST: 14 U/L (ref 5–34)
BILIRUBIN TOTAL: 0.57 mg/dL (ref 0.20–1.20)
BUN: 42.3 mg/dL — ABNORMAL HIGH (ref 7.0–26.0)
CO2: 27 mEq/L (ref 22–29)
Calcium: 10.6 mg/dL — ABNORMAL HIGH (ref 8.4–10.4)
Chloride: 104 mEq/L (ref 98–109)
Creatinine: 1.4 mg/dL — ABNORMAL HIGH (ref 0.7–1.3)
EGFR: 46 mL/min/{1.73_m2} — AB (ref >90)
GLUCOSE: 113 mg/dL (ref 70–140)
Potassium: 4.3 mEq/L (ref 3.5–5.1)
SODIUM: 141 meq/L (ref 136–145)
TOTAL PROTEIN: 7.4 g/dL (ref 6.4–8.3)

## 2016-08-12 NOTE — Progress Notes (Signed)
   Subjective:    Patient ID: Craig [name]., male    DOB: [birth year], 83 y.o.   MRN: [id number]  HPI This patient presents today seated in a wheelchair and unable to transfer with his wife present in the treatment room. Patient's and requesting debridement of thickened toenails which are having difficulty with self treatment.Patient wife trimming toenails.  Patient's wife states that she has been applying a prescription cream for catheter  prophylaxis with nystatin triamcinolone to the toenails particularly hallux toenails. Denies any recent podiatric care.   Patient has multiple health issues and is nonambulatory    Review of Systems Positive for painful toenails Gait disturbance with inability to walk Positive for peripheral edema  Patient is retired radiologist    Objective:   Physical Exam  Pleasant orientated 5 male seated in a wheelchair and unable to transfer  Vascular: Peripheral pitting edema bilaterally DP pulses 2/4 bilaterally PT pulses palpable bilaterally Capillary reflex is normal limits bilaterally  Neurological: Sensation to 10 g monofilament wire intact 0/5 bilaterally Vibratory sedation nonreactive bilaterally Ankle reflexes weakly reactive bilaterally  Dermatological: No open skin lesions bilaterally Atrophic skin with absent hair growth bilaterally The toenails are elongated, brittle, deformed with maximum deformity and hallux bilaterally  Musculoskeletal: Nonambulatory patient No restriction ankle, subtalar, midtarsal joints bilaterally      Assessment & Plan:   Assessment: Peripheral neuropathy Peripheral edema Peripheral vascular disease Gait disturbance Mycotic toenails 6-10  Plan: Debridement of toenails 6-10 mechanically and electrically without any bleeding Instructed patient's wife to DC prescription cream described above Discussed  Using Vics VapoRub or Vaseline to soften the toenails  Reappoint at patient's  request

## 2016-08-12 NOTE — Telephone Encounter (Signed)
Scheduled appt per 6/21 los - Gave patient AVS and calender per LOS. 

## 2016-08-12 NOTE — Progress Notes (Signed)
Hematology and Oncology Follow Up Visit  Craig [name] [id number] 1924-04-17 83 y.o. 08/12/2016 11:57 AM [name], [name], DO   Principle Diagnosis: 83 year old with Castration resistant prostate cancer with metastatic disease to the bone. He was initially diagnosed in 2011 PSA of 19 and presented with advanced disease.   Prior Therapy: He is status post combined androgen deprivation with Lupron and Casodex with an excellent response initially and had a PSA nadir down to 1.67. Most recently he developed progression of disease and a PSA up to 5.94 in September of 2014. He is status post SRS treatment to the L4 completed on 02/13/2014. He is S/P stereotactic body radiotherapy to the iliac bone treatment completed in 02/2014. He is status post radiation therapy to the right femur completed on 06/07/2014.  Current therapy: He is on Xtandi started on 11/15/2012. He was started on 160 mg initially but the dose was reduced to 80 mg in December of 2014. Treatment has been on hold for 2 months. Xtandi resumed at 40 mg daily starting on 06/12/2014.  He is on Lupron and Xgeva done at [facility].  .  Interim History: Craig [name] presents today for a followup visit with his wife. Since the last visit, he reports doing well without any complaints. His quality of life, performance status not dramatically changed since the last visit.  He continues to be on Xtandi of the current dose without any further decline in his energy or performance status. He denied any nausea or abdominal distention. He denied any recent hospitalization or illnesses. He continues to have a Foley catheter in place that is replaced on a monthly basis. He denied any new pain or pathological fractures.   He does not report any headaches, blurry vision, syncope or seizures. He does not report any chest pain, palpitation orthopnea. Has not reported any cough or hemoptysis or hematemesis. Does not report any  nausea or vomiting or abdominal pain. Does not report any constipation or diarrhea. Does not report any hematochezia or melena. Does not report any skin rashes or lesions. Rest of his review of system is unremarkable.  Medications: I have reviewed the patient's current medications.  Current Outpatient Prescriptions  Medication Sig Dispense Refill  . acetaminophen (TYLENOL) 500 MG tablet Take 1,000 mg by mouth daily.     Marland Kitchen amLODipine (NORVASC) 10 MG tablet Take 1 tablet (10 mg total) by mouth daily. 90 tablet 3  . atorvastatin (LIPITOR) 10 MG tablet Take 1 tablet (10 mg total) by mouth daily. 90 tablet 3  . B Complex-C (B-COMPLEX WITH VITAMIN C) tablet Take 1 tablet by mouth daily.      . Denosumab (XGEVA [place]) Inject into the skin every 30 (thirty) days. Monthly. Last inj was 8-19- 2016. Wife not sure what dose it is    . docusate sodium (COLACE) 100 MG capsule Take 100 mg by mouth 2 (two) times daily.    Marland Kitchen ezetimibe (ZETIA) 10 MG tablet Take 1 tablet (10 mg total) by mouth daily. 90 tablet 3  . furosemide (LASIX) 40 MG tablet Take 1 tablet (40 mg total) by mouth daily. 90 tablet 3  . Leuprolide Acetate (LUPRON DEPOT IM) Inject 1 each into the muscle every 6 (six) months. Last inj was in March 2016    . losartan (COZAAR) 100 MG tablet Take 1 tablet (100 mg total) by mouth daily. 90 tablet 3  . multivitamin (THERAGRAN) per tablet Take 1 tablet by mouth daily. ( with  B - Complex )    . Omega-3 Fatty Acids (FISH OIL PO) Take 1 capsule by mouth daily. ( Mega Red )    . oxybutynin (DITROPAN) 5 MG tablet as needed.     . Probiotic Product (PROBIOTIC ADVANCED PO) Take 1 capsule by mouth daily.    . ranitidine (ZANTAC) 150 MG tablet Take 150 mg by mouth at bedtime.    Marland Kitchen warfarin (COUMADIN) 5 MG tablet TAKE AS DIRECTED (Patient taking differently: TAKE AS DIRECTED 5 mg x 2 and 2.5 mg x 5 days) 30 tablet 2  . XTANDI 40 MG capsule TAKE 1 CAPSULE BY MOUTH ONCE DAILY 30 capsule 0   No current  facility-administered medications for this visit.      Allergies:  Allergies  Allergen Reactions  . Pravachol Other (See Comments)    Muscle weakness  . Zocor [Simvastatin - High Dose] Other (See Comments)    Muscle weakness    Past Medical History, Surgical history, Social history, and Family History were reviewed and updated.   Physical Exam: Blood pressure (!) 174/72, pulse 65, temperature 97.7 F (36.5 C), temperature source Oral, resp. rate 20, height 6' (1.829 m), SpO2 100 %. ECOG: 2 General appearance: Elderly gentleman without distress today. He is in a wheelchair. Head: Normocephalic, without obvious abnormality.  Neck: no adenopathy or masses. Lymph nodes: Cervical, supraclavicular, and axillary nodes normal. Heart:  Irregular rhythm without any murmurs or gallops. Lung:chest clear, no wheezing, rales, no dullness to percussion. Abdomin: soft, non-tender, without masses or organomegaly. No rebound or guarding. EXT: No cyanosis or edema noted. Neurological examination: No deficits noted.  Lab Results: Lab Results  Component Value Date   WBC 10.1 08/12/2016   HGB 14.1 08/12/2016   HCT 42.9 08/12/2016   MCV 97.1 08/12/2016   PLT 160 08/12/2016     Chemistry      Component Value Date/Time   NA 141 07/21/2016 1150   NA 141 07/02/2016 1155   K 4.1 07/21/2016 1150   K 4.4 07/02/2016 1155   CL 100 07/21/2016 1150   CO2 25 07/21/2016 1150   CO2 29 07/02/2016 1155   BUN 37 (H) 07/21/2016 1150   BUN 43.2 (H) 07/02/2016 1155   CREATININE 1.24 07/21/2016 1150   CREATININE 1.4 (H) 07/02/2016 1155      Component Value Date/Time   CALCIUM 10.6 (H) 07/21/2016 1150   CALCIUM 10.4 07/02/2016 1155   ALKPHOS 65 07/21/2016 1150   ALKPHOS 65 07/02/2016 1155   AST 11 07/21/2016 1150   AST 13 07/02/2016 1155   ALT 7 07/21/2016 1150   ALT 11 07/02/2016 1155   BILITOT 0.6 07/21/2016 1150   BILITOT 0.56 07/02/2016 1155      Results for Craig [name], Craig [name] (MRN  [id number]) as of 08/12/2016 11:41  Ref. Range 03/09/2016 11:46 04/09/2016 11:16 07/02/2016 11:54  PSA Latest Ref Range: 0.0 - 4.0 ng/mL 21.0 (H) 21.8 (H) 29.8 (H)      Impression and Plan:  83 year old gentleman with the following issues:   1. Castration resistant prostate cancer with metastatic disease to the bone. He started on xtandi in September 2014 and have required dose reduction to 40 mg since that time.   His PSA from 04/09/2016 and bone scan obtained 04/13/2016 were reviewed and showed stable disease. His PSA have not changed dramatically since November 2017. His bone scan did not show any new lesions.  The plan is to continue with Xtandi at this time without any  change in dosage schedule. If his PSA continues to rise, we will obtain imaging studies in August 2018.  2. Androgen depravation: He is to continue Lupron at this time. He receives this under the care of Dr. Junious Silk. I have recommended continuing this indefinitely.  3. Bone directed therapy: continues to be on Xgeva and have tolerated it well. No complications noted and I recommended continuing this.  4. Hypercalcemia: His calcium was normal on 07/21/2016.  5. Bony metastasis: He have received radiation therapy to the lumbar spine as well as the right femur. No increased pain recently and repeat radiation therapy can be considered in other areas  6. 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 He is at risk of developing metastatic disease. He'll continue observation in surveillance from the standpoint.  7. Followup: July 2018.  [facility], MD 6/21/201811:57 AM

## 2016-08-12 NOTE — Progress Notes (Signed)
Patient ID: Craig [name]., male   DOB: 1924-07-08, 83 y.o.   MRN: [id number]

## 2016-08-13 ENCOUNTER — Telehealth: Payer: Self-pay | Admitting: *Deleted

## 2016-08-13 ENCOUNTER — Telehealth: Payer: Self-pay

## 2016-08-13 LAB — PSA: Prostate Specific Ag, Serum: 33 ng/mL — ABNORMAL HIGH (ref 0.0–4.0)

## 2016-08-13 NOTE — Telephone Encounter (Signed)
-----   Message from Craig Portela, MD sent at 08/13/2016  9:07 AM EDT ----- Please let his wife know his PSA is slightly up. No change for now.

## 2016-08-13 NOTE — Telephone Encounter (Signed)
Spoke with wife betty, let her know PSA is up again, but no change for now.

## 2016-08-13 NOTE — Telephone Encounter (Signed)
Wife called for PSA results. Given to her with Dr Hazeline Junker comment.

## 2016-08-23 ENCOUNTER — Other Ambulatory Visit: Payer: Self-pay | Admitting: Oncology

## 2016-08-23 ENCOUNTER — Ambulatory Visit (INDEPENDENT_AMBULATORY_CARE_PROVIDER_SITE_OTHER): Payer: Self-pay | Admitting: Pharmacist

## 2016-08-23 DIAGNOSIS — C61 Malignant neoplasm of prostate: Secondary | ICD-10-CM

## 2016-08-23 DIAGNOSIS — I482 Chronic atrial fibrillation, unspecified: Secondary | ICD-10-CM

## 2016-08-23 DIAGNOSIS — Z5181 Encounter for therapeutic drug level monitoring: Secondary | ICD-10-CM

## 2016-08-23 DIAGNOSIS — C7951 Secondary malignant neoplasm of bone: Secondary | ICD-10-CM

## 2016-08-23 LAB — POCT INR: INR: 2

## 2016-09-06 ENCOUNTER — Ambulatory Visit (INDEPENDENT_AMBULATORY_CARE_PROVIDER_SITE_OTHER): Payer: Medicare Other | Admitting: Cardiology

## 2016-09-06 DIAGNOSIS — I482 Chronic atrial fibrillation, unspecified: Secondary | ICD-10-CM

## 2016-09-06 DIAGNOSIS — Z5181 Encounter for therapeutic drug level monitoring: Secondary | ICD-10-CM

## 2016-09-06 LAB — POCT INR: INR: 1.9

## 2016-09-11 DIAGNOSIS — I480 Paroxysmal atrial fibrillation: Secondary | ICD-10-CM | POA: Diagnosis not present

## 2016-09-14 ENCOUNTER — Telehealth: Payer: Self-pay | Admitting: Oncology

## 2016-09-14 ENCOUNTER — Ambulatory Visit (HOSPITAL_BASED_OUTPATIENT_CLINIC_OR_DEPARTMENT_OTHER): Payer: Medicare Other | Admitting: Oncology

## 2016-09-14 ENCOUNTER — Other Ambulatory Visit (HOSPITAL_BASED_OUTPATIENT_CLINIC_OR_DEPARTMENT_OTHER): Payer: Medicare Other

## 2016-09-14 VITALS — BP 159/56 | HR 60 | Temp 97.6°F | Resp 16 | Ht 72.0 in

## 2016-09-14 DIAGNOSIS — C61 Malignant neoplasm of prostate: Secondary | ICD-10-CM

## 2016-09-14 DIAGNOSIS — C7951 Secondary malignant neoplasm of bone: Secondary | ICD-10-CM | POA: Diagnosis not present

## 2016-09-14 DIAGNOSIS — C434 Malignant melanoma of scalp and neck: Secondary | ICD-10-CM

## 2016-09-14 DIAGNOSIS — R338 Other retention of urine: Secondary | ICD-10-CM | POA: Diagnosis not present

## 2016-09-14 DIAGNOSIS — E291 Testicular hypofunction: Secondary | ICD-10-CM

## 2016-09-14 LAB — COMPREHENSIVE METABOLIC PANEL
ALT: 10 U/L (ref 0–55)
AST: 12 U/L (ref 5–34)
Albumin: 3.4 g/dL — ABNORMAL LOW (ref 3.5–5.0)
Alkaline Phosphatase: 65 U/L (ref 40–150)
Anion Gap: 10 mEq/L (ref 3–11)
BUN: 41 mg/dL — AB (ref 7.0–26.0)
CALCIUM: 11.1 mg/dL — AB (ref 8.4–10.4)
CO2: 28 mEq/L (ref 22–29)
CREATININE: 1.2 mg/dL (ref 0.7–1.3)
Chloride: 103 mEq/L (ref 98–109)
EGFR: 51 mL/min/{1.73_m2} — ABNORMAL LOW (ref >90)
Glucose: 120 mg/dl (ref 70–140)
POTASSIUM: 4.3 meq/L (ref 3.5–5.1)
Sodium: 140 mEq/L (ref 136–145)
Total Bilirubin: 0.58 mg/dL (ref 0.20–1.20)
Total Protein: 7.4 g/dL (ref 6.4–8.3)

## 2016-09-14 LAB — CBC WITH DIFFERENTIAL/PLATELET
BASO%: 0.5 % (ref 0.0–2.0)
BASOS ABS: 0 10*3/uL (ref 0.0–0.1)
EOS ABS: 0.3 10*3/uL (ref 0.0–0.5)
EOS%: 3 % (ref 0.0–7.0)
HEMATOCRIT: 45.3 % (ref 38.4–49.9)
HEMOGLOBIN: 14.8 g/dL (ref 13.0–17.1)
LYMPH#: 1.9 10*3/uL (ref 0.9–3.3)
LYMPH%: 18.5 % (ref 14.0–49.0)
MCH: 31.8 pg (ref 27.2–33.4)
MCHC: 32.8 g/dL (ref 32.0–36.0)
MCV: 97.1 fL (ref 79.3–98.0)
MONO#: 1 10*3/uL — AB (ref 0.1–0.9)
MONO%: 9.7 % (ref 0.0–14.0)
NEUT#: 7 10*3/uL — ABNORMAL HIGH (ref 1.5–6.5)
NEUT%: 68.3 % (ref 39.0–75.0)
Platelets: 167 10*3/uL (ref 140–400)
RBC: 4.66 10*6/uL (ref 4.20–5.82)
RDW: 15.5 % — AB (ref 11.0–14.6)
WBC: 10.2 10*3/uL (ref 4.0–10.3)

## 2016-09-14 NOTE — Telephone Encounter (Signed)
Gave relative avs report and appointments for August.

## 2016-09-14 NOTE — Progress Notes (Signed)
Hematology and Oncology Follow Up Visit  Craig [name] [id number] [birth year] 83 y.o. 05-05-2016 12:03 PM [name], [name], DO   Principle Diagnosis: 83 year old with Castration resistant prostate cancer with metastatic disease to the bone. He was initially diagnosed in 2011 PSA of 19 and presented with advanced disease.   Prior Therapy: He is status post combined androgen deprivation with Lupron and Casodex with an excellent response initially and had a PSA nadir down to 1.67. Most recently he developed progression of disease and a PSA up to 5.94 in September of 2014. He is status post SRS treatment to the L4 completed on 02/13/2014. He is S/P stereotactic body radiotherapy to the iliac bone treatment completed in 02/2014. He is status post radiation therapy to the right femur completed on 06/07/2014.  Current therapy: He is on Xtandi started on 11/15/2012. He was started on 160 mg initially but the dose was reduced to 80 mg in December of 2014. Treatment has been on hold for 2 months. Xtandi resumed at 40 mg daily starting on 06/12/2014.  He is on Lupron and Xgeva done at [facility].  .  Interim History: [name] presents today for a followup visit with his wife. Since the last visit, he continues to be without recent issues. He continues to take Xtandi at the current dose without any further decline in his energy or performance status.reports doing well without any complaints. His quality of life, performance status not dramatically changed since the last visit.  He denied any nausea or abdominal distention. He denied any recent hospitalization or illnesses. He continues to have a Foley catheter in place that is replaced on a monthly basis and was replaced today. He denied any new pain or pathological fractures.   He does not report any headaches, blurry vision, syncope or seizures. He does not report any chest pain, palpitation orthopnea. Has not reported  any cough or hemoptysis or hematemesis. Does not report any nausea or vomiting or abdominal pain. Does not report any constipation or diarrhea. Does not report any hematochezia or melena. Does not report any skin rashes or lesions. Rest of his review of system is unremarkable.  Medications: I have reviewed the patient's current medications.  Current Outpatient Prescriptions  Medication Sig Dispense Refill  . acetaminophen (TYLENOL) 500 MG tablet Take 1,000 mg by mouth daily.     Marland Kitchen amLODipine (NORVASC) 10 MG tablet Take 1 tablet (10 mg total) by mouth daily. 90 tablet 3  . atorvastatin (LIPITOR) 10 MG tablet Take 1 tablet (10 mg total) by mouth daily. 90 tablet 3  . B Complex-C (B-COMPLEX WITH VITAMIN C) tablet Take 1 tablet by mouth daily.      . Denosumab (XGEVA [place]) Inject into the skin every 30 (thirty) days. Monthly. Last inj was 8-19- 2016. Wife not sure what dose it is    . docusate sodium (COLACE) 100 MG capsule Take 100 mg by mouth 2 (two) times daily.    Marland Kitchen ezetimibe (ZETIA) 10 MG tablet Take 1 tablet (10 mg total) by mouth daily. 90 tablet 3  . furosemide (LASIX) 40 MG tablet Take 1 tablet (40 mg total) by mouth daily. 90 tablet 3  . Leuprolide Acetate (LUPRON DEPOT IM) Inject 1 each into the muscle every 6 (six) months. Last inj was in March 2016    . losartan (COZAAR) 100 MG tablet Take 1 tablet (100 mg total) by mouth daily. 90 tablet 3  . multivitamin (THERAGRAN) per tablet  Take 1 tablet by mouth daily. ( with B - Complex )    . Omega-3 Fatty Acids (FISH OIL PO) Take 1 capsule by mouth daily. ( Mega Red )    . oxybutynin (DITROPAN) 5 MG tablet as needed.     . Probiotic Product (PROBIOTIC ADVANCED PO) Take 1 capsule by mouth daily.    . ranitidine (ZANTAC) 150 MG tablet Take 150 mg by mouth at bedtime.    Marland Kitchen warfarin (COUMADIN) 5 MG tablet TAKE AS DIRECTED (Patient taking differently: TAKE AS DIRECTED 5 mg x 2 and 2.5 mg x 5 days) 30 tablet 2  . XTANDI 40 MG capsule TAKE 1 CAPSULE BY  MOUTH ONCE DAILY 30 capsule 0   No current facility-administered medications for this visit.      Allergies:  Allergies  Allergen Reactions  . Pravachol Other (See Comments)    Muscle weakness  . Zocor [Simvastatin - High Dose] Other (See Comments)    Muscle weakness    Past Medical History, Surgical history, Social history, and Family History were reviewed and updated.   Physical Exam: Blood pressure (!) 159/56, pulse 60, temperature 97.6 F (36.4 C), temperature source Oral, resp. rate 16, height 6' (1.829 m), SpO2 97 %. ECOG: 2 General appearance: Alert, awake gentleman without distress. Head: Normocephalic, without obvious abnormality. No oral ulcers or lesions. Neck: no adenopathy or masses. Lymph nodes: Cervical, supraclavicular, and axillary nodes normal. Heart:  Irregular rhythm without any murmurs or gallops. Lung:chest clear, no wheezing, rales, no dullness to percussion. Abdomin: soft, non-tender, without masses or organomegaly. No shifting dullness or ascites. EXT: No cyanosis or edema noted. Neurological examination: No deficits noted.  Lab Results: Lab Results  Component Value Date   WBC 10.2 07-25-2016   HGB 14.8 07-25-2016   HCT 45.3 07-25-2016   MCV 97.1 07-25-2016   PLT 167 07-25-2016     Chemistry      Component Value Date/Time   NA 141 08/12/2016 1126   K 4.3 08/12/2016 1126   CL 100 07/21/2016 1150   CO2 27 08/12/2016 1126   BUN 42.3 (H) 08/12/2016 1126   CREATININE 1.4 (H) 08/12/2016 1126      Component Value Date/Time   CALCIUM 10.6 (H) 08/12/2016 1126   ALKPHOS 69 08/12/2016 1126   AST 14 08/12/2016 1126   ALT 10 08/12/2016 1126   BILITOT 0.57 08/12/2016 1126    Results for ORLANDO, DEVEREUX (MRN [id number]) as of June 04, 202018 11:48  Ref. Range 04/09/2016 11:16 07/02/2016 11:54 08/12/2016 11:26  Prostate Specific Ag, Serum Latest Ref Range: 0.0 - 4.0 ng/mL 21.8 (H) 29.8 (H) 33.0 (H)      Impression and Plan:  83 year old  gentleman with the following issues:   1. Castration resistant prostate cancer with metastatic disease to the bone. He started on xtandi in September 2014 and have required dose reduction to 40 mg since that time.   His PSA has been rising rather slowly although he is completely asymptomatic. He has tolerated this medication reasonably well and has not interfered with his quality of life.  The plan is to continue with Xtandi  at this time and repeat bone scan and a PSA in 4 weeks. This resolves therapy can be used if he has symptomatic or measurable disease progression.  2. Androgen depravation: He is to continue Lupron at this time. He receives this under the care of Dr. Junious Silk. This is to be continued indefinitely.  3. Bone directed therapy: continues to  be on Xgeva and have tolerated it well. No complications noted and I recommended continuing this.  4. Hypercalcemia: His calcium and your normal range and repeated periodically.  5. Bony metastasis: He have received radiation therapy to the lumbar spine as well as the right femur. No increased pain recently and repeat radiation therapy can be considered in other areas  6. 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  7. Followup: In 4 weeks to follow his progress.  [facility], MD November 27, 20201812:03 PM

## 2016-09-15 ENCOUNTER — Telehealth: Payer: Self-pay | Admitting: *Deleted

## 2016-09-15 LAB — PSA: Prostate Specific Ag, Serum: 35.4 ng/mL — ABNORMAL HIGH (ref 0.0–4.0)

## 2016-09-15 NOTE — Telephone Encounter (Signed)
Lm on wife betty's cell phone. Results of last PSA

## 2016-09-15 NOTE — Telephone Encounter (Signed)
Spouse [name] returned call.  This nurse provided yesterday's results along with provider information.   PSA = 35.4.

## 2016-09-15 NOTE — Telephone Encounter (Signed)
-----   Message from Wyatt Portela, MD sent at 09/15/2016  8:42 AM EDT ----- Please let him know his PSA is up slightly. No change for now.

## 2016-09-20 ENCOUNTER — Ambulatory Visit (INDEPENDENT_AMBULATORY_CARE_PROVIDER_SITE_OTHER): Payer: Self-pay | Admitting: Internal Medicine

## 2016-09-20 DIAGNOSIS — I482 Chronic atrial fibrillation, unspecified: Secondary | ICD-10-CM

## 2016-09-20 DIAGNOSIS — Z5181 Encounter for therapeutic drug level monitoring: Secondary | ICD-10-CM

## 2016-09-20 LAB — POCT INR: INR: 2.3

## 2016-09-23 ENCOUNTER — Other Ambulatory Visit: Payer: Self-pay | Admitting: Oncology

## 2016-09-23 DIAGNOSIS — C61 Malignant neoplasm of prostate: Secondary | ICD-10-CM

## 2016-09-23 DIAGNOSIS — C7951 Secondary malignant neoplasm of bone: Secondary | ICD-10-CM

## 2016-10-04 ENCOUNTER — Encounter (HOSPITAL_COMMUNITY)
Admission: RE | Admit: 2016-10-04 | Discharge: 2016-10-04 | Disposition: A | Discharge status: Home / Self Care | Payer: Medicare Other | Source: Ambulatory Visit | Attending: Oncology | Admitting: Oncology

## 2016-10-04 ENCOUNTER — Ambulatory Visit (INDEPENDENT_AMBULATORY_CARE_PROVIDER_SITE_OTHER): Payer: Medicare Other | Admitting: Interventional Cardiology

## 2016-10-04 ENCOUNTER — Other Ambulatory Visit (HOSPITAL_BASED_OUTPATIENT_CLINIC_OR_DEPARTMENT_OTHER): Payer: Medicare Other

## 2016-10-04 DIAGNOSIS — C61 Malignant neoplasm of prostate: Secondary | ICD-10-CM | POA: Diagnosis not present

## 2016-10-04 DIAGNOSIS — I482 Chronic atrial fibrillation, unspecified: Secondary | ICD-10-CM

## 2016-10-04 DIAGNOSIS — C7951 Secondary malignant neoplasm of bone: Secondary | ICD-10-CM | POA: Insufficient documentation

## 2016-10-04 DIAGNOSIS — Z5181 Encounter for therapeutic drug level monitoring: Secondary | ICD-10-CM

## 2016-10-04 LAB — CBC WITH DIFFERENTIAL/PLATELET
BASO%: 0.3 % (ref 0.0–2.0)
Basophils Absolute: 0 10*3/uL (ref 0.0–0.1)
EOS%: 2.4 % (ref 0.0–7.0)
Eosinophils Absolute: 0.2 10*3/uL (ref 0.0–0.5)
HCT: 42.6 % (ref 38.4–49.9)
HGB: 14 g/dL (ref 13.0–17.1)
LYMPH%: 20.4 % (ref 14.0–49.0)
MCH: 32.1 pg (ref 27.2–33.4)
MCHC: 32.9 g/dL (ref 32.0–36.0)
MCV: 97.7 fL (ref 79.3–98.0)
MONO#: 1 10*3/uL — ABNORMAL HIGH (ref 0.1–0.9)
MONO%: 10.2 % (ref 0.0–14.0)
NEUT%: 66.7 % (ref 39.0–75.0)
NEUTROS ABS: 6.3 10*3/uL (ref 1.5–6.5)
Platelets: 156 10*3/uL (ref 140–400)
RBC: 4.36 10*6/uL (ref 4.20–5.82)
RDW: 14.7 % — ABNORMAL HIGH (ref 11.0–14.6)
WBC: 9.4 10*3/uL (ref 4.0–10.3)
lymph#: 1.9 10*3/uL (ref 0.9–3.3)

## 2016-10-04 LAB — COMPREHENSIVE METABOLIC PANEL
ALBUMIN: 3.2 g/dL — AB (ref 3.5–5.0)
ALT: 9 U/L (ref 0–55)
AST: 12 U/L (ref 5–34)
Alkaline Phosphatase: 56 U/L (ref 40–150)
Anion Gap: 8 mEq/L (ref 3–11)
BUN: 45.6 mg/dL — AB (ref 7.0–26.0)
CHLORIDE: 105 meq/L (ref 98–109)
CO2: 27 mEq/L (ref 22–29)
Calcium: 10.9 mg/dL — ABNORMAL HIGH (ref 8.4–10.4)
Creatinine: 1.3 mg/dL (ref 0.7–1.3)
EGFR: 48 mL/min/{1.73_m2} — ABNORMAL LOW (ref >90)
Glucose: 134 mg/dl (ref 70–140)
POTASSIUM: 4.2 meq/L (ref 3.5–5.1)
SODIUM: 141 meq/L (ref 136–145)
TOTAL PROTEIN: 6.9 g/dL (ref 6.4–8.3)
Total Bilirubin: 0.57 mg/dL (ref 0.20–1.20)

## 2016-10-04 LAB — POCT INR: INR: 1.8

## 2016-10-04 MED ORDER — TECHNETIUM TC 99M MEDRONATE IV KIT
20.7000 | PACK | Freq: Once | INTRAVENOUS | Status: AC
  Administered 2016-10-04: 20.7 via INTRAVENOUS

## 2016-10-05 LAB — PSA: Prostate Specific Ag, Serum: 39 ng/mL — ABNORMAL HIGH (ref 0.0–4.0)

## 2016-10-15 ENCOUNTER — Telehealth: Payer: Self-pay

## 2016-10-15 ENCOUNTER — Encounter: Payer: Self-pay | Admitting: Pharmacist

## 2016-10-15 ENCOUNTER — Ambulatory Visit (HOSPITAL_BASED_OUTPATIENT_CLINIC_OR_DEPARTMENT_OTHER): Payer: Medicare Other | Admitting: Oncology

## 2016-10-15 VITALS — BP 168/69 | HR 59 | Temp 97.7°F | Resp 20 | Ht 72.0 in

## 2016-10-15 DIAGNOSIS — C61 Malignant neoplasm of prostate: Secondary | ICD-10-CM | POA: Diagnosis not present

## 2016-10-15 DIAGNOSIS — E291 Testicular hypofunction: Secondary | ICD-10-CM | POA: Diagnosis not present

## 2016-10-15 DIAGNOSIS — R338 Other retention of urine: Secondary | ICD-10-CM | POA: Diagnosis not present

## 2016-10-15 DIAGNOSIS — C7951 Secondary malignant neoplasm of bone: Secondary | ICD-10-CM

## 2016-10-15 NOTE — Telephone Encounter (Signed)
Gave patient return appointment for 9/25 printed avs, and calender per los

## 2016-10-15 NOTE — Progress Notes (Signed)
Hematology and Oncology Follow Up Visit  Craig [name] [id number] [birth year] 83 y.o. 10/15/2016 11:43 AM Craig [name]   Principle Diagnosis: 83 year old with Castration resistant prostate cancer with metastatic disease to the bone. He was initially diagnosed in 2011 PSA of 19 and presented with advanced disease.   Prior Therapy: He is status post combined androgen deprivation with Lupron and Casodex with an excellent response initially and had a PSA nadir down to 1.67. Most recently he developed progression of disease and a PSA up to 5.94 in September of 2014. He is status post SRS treatment to the L4 completed on 02/13/2014. He is S/P stereotactic body radiotherapy to the iliac bone treatment completed in 02/2014. He is status post radiation therapy to the right femur completed on 06/07/2014.  Current therapy: He is on Xtandi started on 11/15/2012. He was started on 160 mg initially but the dose was reduced to 80 mg in December of 2014. Treatment has been on hold for 2 months. Xtandi resumed at 40 mg daily starting on 06/12/2014.  He is on Lupron and Xgeva done at [facility].  .  Interim History: Craig [name] presents today for a followup visit with his wife. Since the last visit, he reports no recent complaints. His wife reports he is slightly more fatigue and his appetite slightly down but no major changes in his quality of life. He continues to take Xtandi at the current dose without any new side effects. He denied any nausea or abdominal distention. He denied any recent hospitalization or illnesses. He continues to have a Foley catheter in place that is replaced on a monthly basis and was replaced today. He denied any new pain or pathological fractures. He denied any recent hospitalizations or illnesses.   He does not report any headaches, blurry vision, syncope or seizures. He does not report any chest pain, palpitation orthopnea. Has not reported  any cough or hemoptysis or hematemesis. Does not report any nausea or vomiting or abdominal pain. Does not report any constipation or diarrhea. Does not report any hematochezia or melena. Does not report any skin rashes or lesions. Rest of his review of system is unremarkable.  Medications: I have reviewed the patient's current medications.  Current Outpatient Prescriptions  Medication Sig Dispense Refill  . acetaminophen (TYLENOL) 500 MG tablet Take 1,000 mg by mouth daily.     Marland Kitchen amLODipine (NORVASC) 10 MG tablet Take 1 tablet (10 mg total) by mouth daily. 90 tablet 3  . atorvastatin (LIPITOR) 10 MG tablet Take 1 tablet (10 mg total) by mouth daily. 90 tablet 3  . B Complex-C (B-COMPLEX WITH VITAMIN C) tablet Take 1 tablet by mouth daily.      . Denosumab (XGEVA [place]) Inject into the skin every 30 (thirty) days. Monthly. Last inj was 8-19- 2016. Wife not sure what dose it is    . docusate sodium (COLACE) 100 MG capsule Take 100 mg by mouth 2 (two) times daily.    Marland Kitchen ezetimibe (ZETIA) 10 MG tablet Take 1 tablet (10 mg total) by mouth daily. 90 tablet 3  . furosemide (LASIX) 40 MG tablet Take 1 tablet (40 mg total) by mouth daily. 90 tablet 3  . Leuprolide Acetate (LUPRON DEPOT IM) Inject 1 each into the muscle every 6 (six) months. Last inj was in March 2016    . losartan (COZAAR) 100 MG tablet Take 1 tablet (100 mg total) by mouth daily. 90 tablet 3  . multivitamin (  THERAGRAN) per tablet Take 1 tablet by mouth daily. ( with B - Complex )    . Omega-3 Fatty Acids (FISH OIL PO) Take 1 capsule by mouth daily. ( Mega Red )    . oxybutynin (DITROPAN) 5 MG tablet as needed.     . Probiotic Product (PROBIOTIC ADVANCED PO) Take 1 capsule by mouth daily.    . ranitidine (ZANTAC) 150 MG tablet Take 150 mg by mouth at bedtime.    Marland Kitchen warfarin (COUMADIN) 5 MG tablet TAKE AS DIRECTED (Patient taking differently: TAKE AS DIRECTED 5 mg x 2 and 2.5 mg x 5 days) 30 tablet 2  . XTANDI 40 MG capsule TAKE 1 CAPSULE BY  MOUTH ONCE DAILY 30 capsule 0   No current facility-administered medications for this visit.      Allergies:  Allergies  Allergen Reactions  . Pravachol Other (See Comments)    Muscle weakness  . Zocor [Simvastatin - High Dose] Other (See Comments)    Muscle weakness    Past Medical History, Surgical history, Social history, and Family History were reviewed and updated.   Physical Exam: Blood pressure (!) 168/69, pulse (!) 59, temperature 97.7 F (36.5 C), temperature source Oral, resp. rate 20, height 6' (1.829 m), SpO2 100 %. ECOG: 2 General appearance: Well appearing gentleman without distress. Head: Normocephalic, without obvious abnormality. No oral ulcers or lesions. Neck: no adenopathy or masses. Lymph nodes: Cervical, supraclavicular, and axillary nodes normal. Heart:  Irregular rhythm without any murmurs or gallops. Lung:chest clear, no wheezing, rales, no dullness to percussion. Abdomin: soft, non-tender, without masses or organomegaly. No rebound or guarding. EXT: No cyanosis or edema noted. Neurological examination: No deficits noted.  Lab Results: Lab Results  Component Value Date   WBC 9.4 10/04/2016   HGB 14.0 10/04/2016   HCT 42.6 10/04/2016   MCV 97.7 10/04/2016   PLT 156 10/04/2016     Chemistry      Component Value Date/Time   NA 141 10/04/2016 1123   K 4.2 10/04/2016 1123   CL 100 07/21/2016 1150   CO2 27 10/04/2016 1123   BUN 45.6 (H) 10/04/2016 1123   CREATININE 1.3 10/04/2016 1123      Component Value Date/Time   CALCIUM 10.9 (H) 10/04/2016 1123   ALKPHOS 56 10/04/2016 1123   AST 12 10/04/2016 1123   ALT 9 10/04/2016 1123   BILITOT 0.57 10/04/2016 1123      Results for Craig [name] (MRN [id number]) as of 10/15/2016 11:35  Ref. Range 06-Dec-202018 11:35 10/04/2016 11:23  Prostate Specific Ag, Serum Latest Ref Range: 0.0 - 4.0 ng/mL 35.4 (H) 39.0 (H)   EXAM: NUCLEAR MEDICINE WHOLE BODY BONE SCAN  TECHNIQUE: Whole body  anterior and posterior images were obtained approximately 3 hours after intravenous injection of radiopharmaceutical.  RADIOPHARMACEUTICALS:  20.7 mCi Technetium-65m MDP IV  COMPARISON:  04/13/2016 and 10/10/2015 bone scan. 07/11/2014 CT chest.  FINDINGS: Radiotracer uptake ribs (greater on the right) and left pelvis similar to prior exam consistent with stable osseous metastatic disease.  Radiotracer uptake involving the thoracic and lumbar spine less prominent than on prior exams.  No new areas of abnormal radiotracer uptake.  Radiotracer uptake consistent with degenerative changes knees, shoulders and sternoclavicular junction unchanged.  Poor radiotracer uptake by left kidney, unchanged.  IMPRESSION: Radiotracer uptake ribs (greater on the right) and left pelvis similar to prior exam consistent with stable osseous metastatic disease.  No new areas of abnormal radiotracer uptake.  Poor radiotracer uptake  by left kidney, unchanged.   Impression and Plan:  83 year old gentleman with the following issues:   1. Castration resistant prostate cancer with metastatic disease to the bone. He started on xtandi in September 2014 and have required dose reduction to 40 mg since that time.   Bone scan obtained on 10/04/2017 was reviewed today and showed no major changes.  His PSA has shown slight increase but he does not report any new symptoms. Risks and benefits of continuing this medication was reviewed today is agreeable to continue. Different salvage therapy will be used in the future. Develops symptomatic progression.  2. Androgen depravation: He is to continue Lupron at this time. He receives this under the care of Dr. Junious Silk. This will be continued for the time being.  3. Bone directed therapy: continues to be on Xgeva and have tolerated it well. No complications noted and I recommended continuing this.  4. Hypercalcemia: His calcium slightly elevated but no  dramatic changes noted.  5. Bony metastasis: He have received radiation therapy to the lumbar spine as well as the right femur. No recent pain or exacerbations.  6. 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  7. Followup: In 4 weeks to follow his progress.  Zola Button, MD 8/24/201811:43 AM

## 2016-10-18 ENCOUNTER — Ambulatory Visit (INDEPENDENT_AMBULATORY_CARE_PROVIDER_SITE_OTHER): Payer: Medicare Other | Admitting: Cardiovascular Disease

## 2016-10-18 DIAGNOSIS — I482 Chronic atrial fibrillation, unspecified: Secondary | ICD-10-CM

## 2016-10-18 DIAGNOSIS — Z5181 Encounter for therapeutic drug level monitoring: Secondary | ICD-10-CM

## 2016-10-18 LAB — POCT INR: INR: 2

## 2016-10-21 ENCOUNTER — Other Ambulatory Visit: Payer: Self-pay | Admitting: Oncology

## 2016-10-21 DIAGNOSIS — C61 Malignant neoplasm of prostate: Secondary | ICD-10-CM

## 2016-10-21 DIAGNOSIS — C7951 Secondary malignant neoplasm of bone: Secondary | ICD-10-CM

## 2016-11-01 ENCOUNTER — Other Ambulatory Visit: Payer: Self-pay | Admitting: Interventional Cardiology

## 2016-11-01 ENCOUNTER — Ambulatory Visit (INDEPENDENT_AMBULATORY_CARE_PROVIDER_SITE_OTHER): Payer: Medicare Other | Admitting: Cardiology

## 2016-11-01 DIAGNOSIS — I482 Chronic atrial fibrillation, unspecified: Secondary | ICD-10-CM

## 2016-11-01 DIAGNOSIS — Z5181 Encounter for therapeutic drug level monitoring: Secondary | ICD-10-CM

## 2016-11-01 DIAGNOSIS — G464 Cerebellar stroke syndrome: Secondary | ICD-10-CM | POA: Diagnosis not present

## 2016-11-01 DIAGNOSIS — Z8673 Personal history of transient ischemic attack (TIA), and cerebral infarction without residual deficits: Secondary | ICD-10-CM

## 2016-11-01 LAB — POCT INR: INR: 1.9

## 2016-11-15 ENCOUNTER — Ambulatory Visit (INDEPENDENT_AMBULATORY_CARE_PROVIDER_SITE_OTHER): Payer: Medicare Other | Admitting: Internal Medicine

## 2016-11-15 DIAGNOSIS — G464 Cerebellar stroke syndrome: Secondary | ICD-10-CM

## 2016-11-15 DIAGNOSIS — Z8673 Personal history of transient ischemic attack (TIA), and cerebral infarction without residual deficits: Secondary | ICD-10-CM | POA: Diagnosis not present

## 2016-11-15 DIAGNOSIS — I482 Chronic atrial fibrillation, unspecified: Secondary | ICD-10-CM

## 2016-11-15 DIAGNOSIS — Z5181 Encounter for therapeutic drug level monitoring: Secondary | ICD-10-CM

## 2016-11-15 LAB — POCT INR: INR: 1.7

## 2016-11-16 ENCOUNTER — Other Ambulatory Visit (HOSPITAL_BASED_OUTPATIENT_CLINIC_OR_DEPARTMENT_OTHER): Payer: Medicare Other

## 2016-11-16 ENCOUNTER — Ambulatory Visit (HOSPITAL_BASED_OUTPATIENT_CLINIC_OR_DEPARTMENT_OTHER): Payer: Medicare Other | Admitting: Oncology

## 2016-11-16 ENCOUNTER — Telehealth: Payer: Self-pay | Admitting: Oncology

## 2016-11-16 VITALS — BP 184/82 | HR 54 | Temp 97.8°F | Resp 16 | Ht 72.0 in

## 2016-11-16 DIAGNOSIS — C434 Malignant melanoma of scalp and neck: Secondary | ICD-10-CM

## 2016-11-16 DIAGNOSIS — E291 Testicular hypofunction: Secondary | ICD-10-CM

## 2016-11-16 DIAGNOSIS — C7951 Secondary malignant neoplasm of bone: Secondary | ICD-10-CM

## 2016-11-16 DIAGNOSIS — Z5111 Encounter for antineoplastic chemotherapy: Secondary | ICD-10-CM | POA: Diagnosis not present

## 2016-11-16 DIAGNOSIS — C61 Malignant neoplasm of prostate: Secondary | ICD-10-CM

## 2016-11-16 DIAGNOSIS — R338 Other retention of urine: Secondary | ICD-10-CM | POA: Diagnosis not present

## 2016-11-16 LAB — CBC WITH DIFFERENTIAL/PLATELET
BASO%: 0.4 % (ref 0.0–2.0)
BASOS ABS: 0 10*3/uL (ref 0.0–0.1)
EOS ABS: 0.3 10*3/uL (ref 0.0–0.5)
EOS%: 3.4 % (ref 0.0–7.0)
HCT: 41.9 % (ref 38.4–49.9)
HEMOGLOBIN: 14 g/dL (ref 13.0–17.1)
LYMPH%: 19.7 % (ref 14.0–49.0)
MCH: 32.2 pg (ref 27.2–33.4)
MCHC: 33.5 g/dL (ref 32.0–36.0)
MCV: 96 fL (ref 79.3–98.0)
MONO#: 1 10*3/uL — AB (ref 0.1–0.9)
MONO%: 10.6 % (ref 0.0–14.0)
NEUT%: 65.9 % (ref 39.0–75.0)
NEUTROS ABS: 6 10*3/uL (ref 1.5–6.5)
Platelets: 184 10*3/uL (ref 140–400)
RBC: 4.36 10*6/uL (ref 4.20–5.82)
RDW: 14.6 % (ref 11.0–14.6)
WBC: 9 10*3/uL (ref 4.0–10.3)
lymph#: 1.8 10*3/uL (ref 0.9–3.3)

## 2016-11-16 LAB — COMPREHENSIVE METABOLIC PANEL
ALBUMIN: 3.1 g/dL — AB (ref 3.5–5.0)
ALK PHOS: 60 U/L (ref 40–150)
ALT: 8 U/L (ref 0–55)
AST: 13 U/L (ref 5–34)
Anion Gap: 5 mEq/L (ref 3–11)
BUN: 36.4 mg/dL — AB (ref 7.0–26.0)
CO2: 31 mEq/L — ABNORMAL HIGH (ref 22–29)
Calcium: 10.5 mg/dL — ABNORMAL HIGH (ref 8.4–10.4)
Chloride: 103 mEq/L (ref 98–109)
Creatinine: 1.3 mg/dL (ref 0.7–1.3)
EGFR: 47 mL/min/{1.73_m2} — AB (ref >90)
GLUCOSE: 116 mg/dL (ref 70–140)
Potassium: 4.1 mEq/L (ref 3.5–5.1)
SODIUM: 140 meq/L (ref 136–145)
Total Bilirubin: 0.5 mg/dL (ref 0.20–1.20)
Total Protein: 7.1 g/dL (ref 6.4–8.3)

## 2016-11-16 NOTE — Telephone Encounter (Signed)
Scheduled appt per 9/25 los - Gave patient AVS and calender per los.  

## 2016-11-16 NOTE — Progress Notes (Signed)
Hematology and Oncology Follow Up Visit  Craig [name] [id number] February 01, 1925 83 y.o. 11/16/2016 12:05 PM [name], [name], DO   Principle Diagnosis: 83 year old with Castration resistant prostate cancer with metastatic disease to the bone. He was initially diagnosed in 2011 PSA of 19 and presented with advanced disease.   Prior Therapy: He is status post combined androgen deprivation with Lupron and Casodex with an excellent response initially and had a PSA nadir down to 1.67. Most recently he developed progression of disease and a PSA up to 5.94 in September of 2014. He is status post SRS treatment to the L4 completed on 02/13/2014. He is S/P stereotactic body radiotherapy to the iliac bone treatment completed in 02/2014. He is status post radiation therapy to the right femur completed on 06/07/2014.  Current therapy: He is on Xtandi started on 11/15/2012. He was started on 160 mg initially but the dose was reduced to 80 mg in December of 2014. Treatment has been on hold for 2 months. Xtandi resumed at 40 mg daily starting on 06/12/2014.  He is on Lupron and Xgeva done at [facility].  .  Interim History: Craig [name] presents today for a followup visit with his wife. Since the last visit, he developed a urinary tract infection and required antibiotic course and was completed without issues. His wife reports he is slightly more fatigue and his appetite continues to go down. It is unclear whether he lost some weight but for the most part his clinical status has been stable.   He continues to take Xtandi at the current dose without any new side effects. He denied any nausea or abdominal distention. He denied any recent hospitalization or illnesses. He continues to have a Foley catheter in place that is replaced on a monthly basis. He denied any excessive pain or pathological fractures.  He does not report any headaches, blurry vision, syncope or seizures. He does  not report any chest pain, palpitation orthopnea. Has not reported any cough or hemoptysis or hematemesis. Does not report any nausea or vomiting or abdominal pain. Does not report any constipation or diarrhea. Does not report any hematochezia or melena. Does not report any skin rashes or lesions. Rest of his review of system is unremarkable.  Medications: I have reviewed the patient's current medications.  Current Outpatient Prescriptions  Medication Sig Dispense Refill  . acetaminophen (TYLENOL) 500 MG tablet Take 1,000 mg by mouth daily.     Marland Kitchen amLODipine (NORVASC) 10 MG tablet Take 1 tablet (10 mg total) by mouth daily. 90 tablet 3  . atorvastatin (LIPITOR) 10 MG tablet Take 1 tablet (10 mg total) by mouth daily. 90 tablet 3  . B Complex-C (B-COMPLEX WITH VITAMIN C) tablet Take 1 tablet by mouth daily.      . Denosumab (XGEVA [place]) Inject into the skin every 30 (thirty) days. Monthly. Last inj was 8-19- 2016. Wife not sure what dose it is    . docusate sodium (COLACE) 100 MG capsule Take 100 mg by mouth 2 (two) times daily.    Marland Kitchen ezetimibe (ZETIA) 10 MG tablet Take 1 tablet (10 mg total) by mouth daily. 90 tablet 3  . furosemide (LASIX) 40 MG tablet Take 1 tablet (40 mg total) by mouth daily. 90 tablet 3  . Leuprolide Acetate (LUPRON DEPOT IM) Inject 1 each into the muscle every 6 (six) months. Last inj was in March 2016    . losartan (COZAAR) 100 MG tablet Take 1 tablet (  100 mg total) by mouth daily. 90 tablet 3  . multivitamin (THERAGRAN) per tablet Take 1 tablet by mouth daily. ( with B - Complex )    . Omega-3 Fatty Acids (FISH OIL PO) Take 1 capsule by mouth daily. ( Mega Red )    . oxybutynin (DITROPAN) 5 MG tablet as needed.     . Probiotic Product (PROBIOTIC ADVANCED PO) Take 1 capsule by mouth daily.    . ranitidine (ZANTAC) 150 MG tablet Take 150 mg by mouth at bedtime.    Marland Kitchen warfarin (COUMADIN) 5 MG tablet Take as directed by coumadin clinic 30 tablet 3  . XTANDI 40 MG capsule TAKE 1  CAPSULE BY MOUTH ONCE DAILY 30 capsule 0   No current facility-administered medications for this visit.      Allergies:  Allergies  Allergen Reactions  . Pravachol Other (See Comments)    Muscle weakness  . Zocor [Simvastatin - High Dose] Other (See Comments)    Muscle weakness    Past Medical History, Surgical history, Social history, and Family History were reviewed and updated.   Physical Exam: Blood pressure (!) 184/82, pulse (!) 54, temperature 97.8 F (36.6 C), temperature source Oral, resp. rate 16, height 6' (1.829 m), SpO2 97 %. ECOG: 2 General appearance: Alert, awake gentleman wheelchair bound. Head: Normocephalic, without obvious abnormality. No oral thrush or ulcers. Neck: no adenopathy or masses. Lymph nodes: Cervical, supraclavicular, and axillary nodes normal. Heart:  Irregular rhythm without any murmurs or gallops. Lung:chest clear, no wheezing, rales, no dullness to percussion. Abdomin: soft, non-tender, without masses or organomegaly. No shifting dullness or ascites. EXT: No cyanosis or edema noted. Neurological examination: No deficits noted.  Lab Results: Lab Results  Component Value Date   WBC 9.0 11/16/2016   HGB 14.0 11/16/2016   HCT 41.9 11/16/2016   MCV 96.0 11/16/2016   PLT 184 11/16/2016     Chemistry      Component Value Date/Time   NA 141 10/04/2016 1123   K 4.2 10/04/2016 1123   CL 100 07/21/2016 1150   CO2 27 10/04/2016 1123   BUN 45.6 (H) 10/04/2016 1123   CREATININE 1.3 10/04/2016 1123      Component Value Date/Time   CALCIUM 10.9 (H) 10/04/2016 1123   ALKPHOS 56 10/04/2016 1123   AST 12 10/04/2016 1123   ALT 9 10/04/2016 1123   BILITOT 0.57 10/04/2016 1123     Results for ALAA, EYERMAN (MRN [id number]) as of 11/16/2016 12:07  Ref. Range 08/12/2016 11:26 02-25-2016 11:35 10/04/2016 11:23  Prostate Specific Ag, Serum Latest Ref Range: 0.0 - 4.0 ng/mL 33.0 (H) 35.4 (H) 39.0 (H)     Impression and Plan:  83 year old  gentleman with the following issues:   1. Castration resistant prostate cancer with metastatic disease to the bone. He started on xtandi in September 2014 and have required dose reduction to 40 mg since that time.   Bone scan obtained on 10/04/2017 showed no major changes.  His PSA is currently change in the last 6 months and the plan is to continue with extending with the same dose and schedule. Different salvage therapy can be used in the future if needed to.   2. Androgen depravation: He is to continue Lupron at this time. He receives this under the care of Dr. Junious Silk. This will be continued for the time being.  3. Bone directed therapy: continues to be on Xgeva and have tolerated it well. He recommended continuing this for  the time being.  4. Hypercalcemia: His calcium remains stable at this time.  5. Bony metastasis: He have received radiation therapy to the lumbar spine as well as the right femur. No recent pain exacerbation.  6. 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  7. Followup: In 4 weeks to follow his progress.  Zola Button, MD 9/25/201812:05 PM

## 2016-11-17 ENCOUNTER — Telehealth: Payer: Self-pay | Admitting: *Deleted

## 2016-11-17 LAB — PSA: PROSTATE SPECIFIC AG, SERUM: 47.1 ng/mL — AB (ref 0.0–4.0)

## 2016-11-17 NOTE — Telephone Encounter (Signed)
-----   Message from Wyatt Portela, MD sent at 11/17/2016  8:03 AM EDT ----- Please let him know his PSA. Slightly up but no change for now.

## 2016-11-17 NOTE — Telephone Encounter (Signed)
Spoke with patient's wife, gave results of PSA

## 2016-11-17 NOTE — Telephone Encounter (Signed)
Lm for wife betty to call desk nurse. Re: PSA

## 2016-11-18 ENCOUNTER — Other Ambulatory Visit: Payer: Self-pay | Admitting: Oncology

## 2016-11-18 DIAGNOSIS — C61 Malignant neoplasm of prostate: Secondary | ICD-10-CM

## 2016-11-18 DIAGNOSIS — C7951 Secondary malignant neoplasm of bone: Secondary | ICD-10-CM

## 2016-11-29 ENCOUNTER — Ambulatory Visit (INDEPENDENT_AMBULATORY_CARE_PROVIDER_SITE_OTHER): Payer: Medicare Other | Admitting: Internal Medicine

## 2016-11-29 ENCOUNTER — Ambulatory Visit (INDEPENDENT_AMBULATORY_CARE_PROVIDER_SITE_OTHER): Payer: Medicare Other

## 2016-11-29 VITALS — Temp 98.2°F

## 2016-11-29 DIAGNOSIS — Z5181 Encounter for therapeutic drug level monitoring: Secondary | ICD-10-CM

## 2016-11-29 DIAGNOSIS — Z23 Encounter for immunization: Secondary | ICD-10-CM

## 2016-11-29 DIAGNOSIS — I482 Chronic atrial fibrillation, unspecified: Secondary | ICD-10-CM

## 2016-11-29 DIAGNOSIS — I480 Paroxysmal atrial fibrillation: Secondary | ICD-10-CM | POA: Diagnosis not present

## 2016-11-29 LAB — POCT INR: INR: 1.8

## 2016-12-13 ENCOUNTER — Ambulatory Visit (INDEPENDENT_AMBULATORY_CARE_PROVIDER_SITE_OTHER): Payer: Medicare Other | Admitting: Cardiology

## 2016-12-13 DIAGNOSIS — G464 Cerebellar stroke syndrome: Secondary | ICD-10-CM | POA: Diagnosis not present

## 2016-12-13 DIAGNOSIS — Z5181 Encounter for therapeutic drug level monitoring: Secondary | ICD-10-CM | POA: Diagnosis not present

## 2016-12-13 DIAGNOSIS — Z8673 Personal history of transient ischemic attack (TIA), and cerebral infarction without residual deficits: Secondary | ICD-10-CM | POA: Diagnosis not present

## 2016-12-13 DIAGNOSIS — I482 Chronic atrial fibrillation, unspecified: Secondary | ICD-10-CM

## 2016-12-13 LAB — POCT INR: INR: 1.9

## 2016-12-14 DIAGNOSIS — D1801 Hemangioma of skin and subcutaneous tissue: Secondary | ICD-10-CM | POA: Diagnosis not present

## 2016-12-14 DIAGNOSIS — L570 Actinic keratosis: Secondary | ICD-10-CM | POA: Diagnosis not present

## 2016-12-14 DIAGNOSIS — L218 Other seborrheic dermatitis: Secondary | ICD-10-CM | POA: Diagnosis not present

## 2016-12-14 DIAGNOSIS — Z85828 Personal history of other malignant neoplasm of skin: Secondary | ICD-10-CM | POA: Diagnosis not present

## 2016-12-14 DIAGNOSIS — Z85820 Personal history of malignant melanoma of skin: Secondary | ICD-10-CM | POA: Diagnosis not present

## 2016-12-17 ENCOUNTER — Ambulatory Visit (HOSPITAL_BASED_OUTPATIENT_CLINIC_OR_DEPARTMENT_OTHER): Payer: Medicare Other | Admitting: Oncology

## 2016-12-17 ENCOUNTER — Other Ambulatory Visit (HOSPITAL_BASED_OUTPATIENT_CLINIC_OR_DEPARTMENT_OTHER): Payer: Medicare Other

## 2016-12-17 ENCOUNTER — Other Ambulatory Visit: Payer: Self-pay | Admitting: Oncology

## 2016-12-17 VITALS — BP 146/69 | HR 60 | Temp 97.7°F | Resp 16 | Ht 72.0 in

## 2016-12-17 DIAGNOSIS — C61 Malignant neoplasm of prostate: Secondary | ICD-10-CM | POA: Diagnosis not present

## 2016-12-17 DIAGNOSIS — R338 Other retention of urine: Secondary | ICD-10-CM | POA: Diagnosis not present

## 2016-12-17 DIAGNOSIS — C434 Malignant melanoma of scalp and neck: Secondary | ICD-10-CM | POA: Diagnosis not present

## 2016-12-17 DIAGNOSIS — C7951 Secondary malignant neoplasm of bone: Secondary | ICD-10-CM

## 2016-12-17 DIAGNOSIS — E291 Testicular hypofunction: Secondary | ICD-10-CM

## 2016-12-17 LAB — COMPREHENSIVE METABOLIC PANEL
ALT: 8 U/L (ref 0–55)
AST: 12 U/L (ref 5–34)
Albumin: 3.1 g/dL — ABNORMAL LOW (ref 3.5–5.0)
Alkaline Phosphatase: 57 U/L (ref 40–150)
Anion Gap: 9 mEq/L (ref 3–11)
BILIRUBIN TOTAL: 0.54 mg/dL (ref 0.20–1.20)
BUN: 40.1 mg/dL — AB (ref 7.0–26.0)
CO2: 27 meq/L (ref 22–29)
CREATININE: 1.1 mg/dL (ref 0.7–1.3)
Calcium: 10.6 mg/dL — ABNORMAL HIGH (ref 8.4–10.4)
Chloride: 103 mEq/L (ref 98–109)
EGFR: 58 mL/min/{1.73_m2} — AB (ref >60)
GLUCOSE: 106 mg/dL (ref 70–140)
Potassium: 4.5 mEq/L (ref 3.5–5.1)
SODIUM: 139 meq/L (ref 136–145)
TOTAL PROTEIN: 6.9 g/dL (ref 6.4–8.3)

## 2016-12-17 LAB — CBC WITH DIFFERENTIAL/PLATELET
BASO%: 0.5 % (ref 0.0–2.0)
Basophils Absolute: 0 10*3/uL (ref 0.0–0.1)
EOS%: 3.8 % (ref 0.0–7.0)
Eosinophils Absolute: 0.3 10*3/uL (ref 0.0–0.5)
HCT: 40.9 % (ref 38.4–49.9)
HEMOGLOBIN: 13.5 g/dL (ref 13.0–17.1)
LYMPH%: 19.7 % (ref 14.0–49.0)
MCH: 31.8 pg (ref 27.2–33.4)
MCHC: 33 g/dL (ref 32.0–36.0)
MCV: 96.4 fL (ref 79.3–98.0)
MONO#: 0.8 10*3/uL (ref 0.1–0.9)
MONO%: 9.2 % (ref 0.0–14.0)
NEUT%: 66.8 % (ref 39.0–75.0)
NEUTROS ABS: 6 10*3/uL (ref 1.5–6.5)
Platelets: 168 10*3/uL (ref 140–400)
RBC: 4.24 10*6/uL (ref 4.20–5.82)
RDW: 14.9 % — AB (ref 11.0–14.6)
WBC: 9 10*3/uL (ref 4.0–10.3)
lymph#: 1.8 10*3/uL (ref 0.9–3.3)

## 2016-12-17 NOTE — Progress Notes (Signed)
Hematology and Oncology Follow Up Visit  Craig [name] [id number] 1924/03/24 83 y.o. 12/17/2016 12:15 PM Craig [name]   Principle Diagnosis: 83 year old with Castration resistant prostate cancer with metastatic disease to the bone. He was initially diagnosed in 2011 PSA of 19 and presented with advanced disease.   Prior Therapy: He is status post combined androgen deprivation with Lupron and Casodex with an excellent response initially and had a PSA nadir down to 1.67. Most recently he developed progression of disease and a PSA up to 5.94 in September of 2014. He is status post SRS treatment to the L4 completed on 02/13/2014. He is S/P stereotactic body radiotherapy to the iliac bone treatment completed in 02/2014. He is status post radiation therapy to the right femur completed on 06/07/2014.  Current therapy: He is on Xtandi started on 11/15/2012. He was started on 160 mg initially but the dose was reduced to 80 mg in December of 2014. Treatment has been on hold for 2 months. Xtandi resumed at 40 mg daily starting on 06/12/2014.  He is on Lupron and Xgeva done at [facility].  .  Interim History: Craig [name] presents today for a followup visit with his wife. Since the last visit, he reports no major changes in his health.  He celebrated his 89th birthday without any incident. He continues to take Xtandi at the current dose without any new side effects. He denied any nausea or abdominal distention. He denied any recent hospitalization or illnesses. He continues to have a Foley catheter in place that is replaced on a monthly basis. He denied any excessive pain or pathological fractures.  His quality of life and performance status remains limited but not dramatically different.  He denies any recent hospitalizations or illnesses.  He does not report any headaches, blurry vision, syncope or seizures. He does not report any chest pain, palpitation  orthopnea. Has not reported any cough or hemoptysis or hematemesis. Does not report any nausea or vomiting or abdominal pain. Does not report any constipation or diarrhea. Does not report any hematochezia or melena. Does not report any skin rashes or lesions. Rest of his review of system is unremarkable.  Medications: I have reviewed the patient's current medications.  Current Outpatient Prescriptions  Medication Sig Dispense Refill  . acetaminophen (TYLENOL) 500 MG tablet Take 1,000 mg by mouth daily.     Marland Kitchen amLODipine (NORVASC) 10 MG tablet Take 1 tablet (10 mg total) by mouth daily. 90 tablet 3  . atorvastatin (LIPITOR) 10 MG tablet Take 1 tablet (10 mg total) by mouth daily. 90 tablet 3  . B Complex-C (B-COMPLEX WITH VITAMIN C) tablet Take 1 tablet by mouth daily.      . Denosumab (XGEVA [place]) Inject into the skin every 30 (thirty) days. Monthly. Last inj was 8-19- 2016. Wife not sure what dose it is    . docusate sodium (COLACE) 100 MG capsule Take 100 mg by mouth 2 (two) times daily.    Marland Kitchen ezetimibe (ZETIA) 10 MG tablet Take 1 tablet (10 mg total) by mouth daily. 90 tablet 3  . furosemide (LASIX) 40 MG tablet Take 1 tablet (40 mg total) by mouth daily. 90 tablet 3  . Leuprolide Acetate (LUPRON DEPOT IM) Inject 1 each into the muscle every 6 (six) months. Last inj was in March 2016    . losartan (COZAAR) 100 MG tablet Take 1 tablet (100 mg total) by mouth daily. 90 tablet 3  . multivitamin (  THERAGRAN) per tablet Take 1 tablet by mouth daily. ( with B - Complex )    . Omega-3 Fatty Acids (FISH OIL PO) Take 1 capsule by mouth daily. ( Mega Red )    . oxybutynin (DITROPAN) 5 MG tablet as needed.     . Probiotic Product (PROBIOTIC ADVANCED PO) Take 1 capsule by mouth daily.    . ranitidine (ZANTAC) 150 MG tablet Take 150 mg by mouth at bedtime.    Marland Kitchen warfarin (COUMADIN) 5 MG tablet Take as directed by coumadin clinic 30 tablet 3  . XTANDI 40 MG capsule TAKE 1 CAPSULE BY MOUTH ONCE DAILY 30 capsule 0    No current facility-administered medications for this visit.      Allergies:  Allergies  Allergen Reactions  . Pravachol Other (See Comments)    Muscle weakness  . Zocor [Simvastatin - High Dose] Other (See Comments)    Muscle weakness    Past Medical History, Surgical history, Social history, and Family History were reviewed and updated.   Physical Exam: Blood pressure (!) 146/69, pulse 60, temperature 97.7 F (36.5 C), temperature source Oral, resp. rate 16, height 6' (1.829 m), SpO2 97 %. ECOG: 2 General appearance: Comfortable appearing gentleman without distress. Head: Normocephalic, without obvious abnormality. No oral ulcers or lesions. Neck: no adenopathy or masses. Lymph nodes: Cervical, supraclavicular, and axillary nodes normal. Heart:  Irregular rhythm without any murmurs or gallops. Lung:chest clear, no wheezing, rales, no dullness to percussion. Abdomin: soft, non-tender, without masses or organomegaly. No rebound or guarding. EXT: No cyanosis or edema noted. Neurological examination: No deficits noted.  Lab Results: Lab Results  Component Value Date   WBC 9.0 12/17/2016   HGB 13.5 12/17/2016   HCT 40.9 12/17/2016   MCV 96.4 12/17/2016   PLT 168 12/17/2016     Chemistry      Component Value Date/Time   NA 139 12/17/2016 1127   K 4.5 12/17/2016 1127   CL 100 07/21/2016 1150   CO2 27 12/17/2016 1127   BUN 40.1 (H) 12/17/2016 1127   CREATININE 1.1 12/17/2016 1127      Component Value Date/Time   CALCIUM 10.6 (H) 12/17/2016 1127   ALKPHOS 57 12/17/2016 1127   AST 12 12/17/2016 1127   ALT 8 12/17/2016 1127   BILITOT 0.54 12/17/2016 1127      Results for Craig [name] (MRN [id number]) as of 12/17/2016 12:03  Ref. Range 2020/10/316 11:35 10/04/2016 11:23 11/16/2016 11:38  Prostate Specific Ag, Serum Latest Ref Range: 0.0 - 4.0 ng/mL 35.4 (H) 39.0 (H) 47.1 (H)     Impression and Plan:  83 year old gentleman with the following issues:    1. Castration resistant prostate cancer with metastatic disease to the bone. He started on xtandi in September 2014 and have required dose reduction to 40 mg since that time.   Bone scan obtained on 10/04/2017 showed no major changes.  His PSA is continues to slightly rise over the last few months.  His overall performance status is very limited and he is not a candidate for any aggressive therapy.  Given the fact that his disease remained relatively stable, I recommended continuing the same dose and schedule.  We will repeat bone scan in 2019.  2. Androgen depravation: He is to continue Lupron at this time. He receives this under the care of Dr. Junious Silk. This will be continued for the time being.  3. Bone directed therapy: continues to be on Xgeva and have tolerated it  well. He recommended continuing this for the time being.  4. Hypercalcemia: His calcium remains stable at this time.  5. Bony metastasis: He have received radiation therapy to the lumbar spine as well as the right femur.  No need for any repeat radiation at this time.  6. 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  7. Followup: In 4 weeks to follow his progress.  Zola Button, MD 10/26/201812:15 PM

## 2016-12-18 LAB — PSA: Prostate Specific Ag, Serum: 47.1 ng/mL — ABNORMAL HIGH (ref 0.0–4.0)

## 2016-12-20 ENCOUNTER — Telehealth: Payer: Self-pay

## 2016-12-20 ENCOUNTER — Telehealth: Payer: Self-pay | Admitting: *Deleted

## 2016-12-20 NOTE — Telephone Encounter (Signed)
Wife called for PSA results. Given.

## 2016-12-20 NOTE — Telephone Encounter (Signed)
-----   Message from Wyatt Portela, MD sent at 12/20/2016  8:39 AM EDT ----- Please let him know his PSA is unchanged.

## 2016-12-20 NOTE — Telephone Encounter (Signed)
As noted below by Dr. Alen Blew, I informed patient's wife of unchanged PSA level. She verbalized understanding.

## 2016-12-23 DIAGNOSIS — H01004 Unspecified blepharitis left upper eyelid: Secondary | ICD-10-CM | POA: Diagnosis not present

## 2016-12-23 DIAGNOSIS — H26491 Other secondary cataract, right eye: Secondary | ICD-10-CM | POA: Diagnosis not present

## 2016-12-23 DIAGNOSIS — H01001 Unspecified blepharitis right upper eyelid: Secondary | ICD-10-CM | POA: Diagnosis not present

## 2016-12-23 DIAGNOSIS — H01002 Unspecified blepharitis right lower eyelid: Secondary | ICD-10-CM | POA: Diagnosis not present

## 2016-12-23 DIAGNOSIS — H04123 Dry eye syndrome of bilateral lacrimal glands: Secondary | ICD-10-CM | POA: Diagnosis not present

## 2016-12-27 ENCOUNTER — Ambulatory Visit (INDEPENDENT_AMBULATORY_CARE_PROVIDER_SITE_OTHER): Payer: Medicare Other

## 2016-12-27 DIAGNOSIS — I482 Chronic atrial fibrillation, unspecified: Secondary | ICD-10-CM

## 2016-12-27 DIAGNOSIS — Z5181 Encounter for therapeutic drug level monitoring: Secondary | ICD-10-CM | POA: Diagnosis not present

## 2016-12-27 LAB — POCT INR: INR: 2.1

## 2017-01-10 ENCOUNTER — Ambulatory Visit (INDEPENDENT_AMBULATORY_CARE_PROVIDER_SITE_OTHER): Payer: Medicare Other | Admitting: Internal Medicine

## 2017-01-10 DIAGNOSIS — Z5181 Encounter for therapeutic drug level monitoring: Secondary | ICD-10-CM

## 2017-01-10 DIAGNOSIS — I482 Chronic atrial fibrillation, unspecified: Secondary | ICD-10-CM

## 2017-01-10 LAB — POCT INR: INR: 1.8

## 2017-01-18 ENCOUNTER — Telehealth: Payer: Self-pay | Admitting: Oncology

## 2017-01-18 ENCOUNTER — Other Ambulatory Visit (HOSPITAL_BASED_OUTPATIENT_CLINIC_OR_DEPARTMENT_OTHER): Payer: Medicare Other

## 2017-01-18 ENCOUNTER — Ambulatory Visit (HOSPITAL_BASED_OUTPATIENT_CLINIC_OR_DEPARTMENT_OTHER): Payer: Medicare Other | Admitting: Oncology

## 2017-01-18 ENCOUNTER — Other Ambulatory Visit: Payer: Self-pay | Admitting: Oncology

## 2017-01-18 VITALS — BP 181/74 | HR 52 | Temp 97.7°F | Resp 18 | Ht 72.0 in

## 2017-01-18 DIAGNOSIS — E291 Testicular hypofunction: Secondary | ICD-10-CM

## 2017-01-18 DIAGNOSIS — C7951 Secondary malignant neoplasm of bone: Secondary | ICD-10-CM | POA: Diagnosis not present

## 2017-01-18 DIAGNOSIS — C61 Malignant neoplasm of prostate: Secondary | ICD-10-CM

## 2017-01-18 DIAGNOSIS — C434 Malignant melanoma of scalp and neck: Secondary | ICD-10-CM

## 2017-01-18 DIAGNOSIS — R339 Retention of urine, unspecified: Secondary | ICD-10-CM | POA: Diagnosis not present

## 2017-01-18 LAB — COMPREHENSIVE METABOLIC PANEL
ALBUMIN: 3.5 g/dL (ref 3.5–5.0)
ALK PHOS: 62 U/L (ref 40–150)
ALT: 10 U/L (ref 0–55)
AST: 12 U/L (ref 5–34)
Anion Gap: 9 mEq/L (ref 3–11)
BILIRUBIN TOTAL: 0.53 mg/dL (ref 0.20–1.20)
BUN: 41.4 mg/dL — AB (ref 7.0–26.0)
CALCIUM: 10.7 mg/dL — AB (ref 8.4–10.4)
CO2: 26 mEq/L (ref 22–29)
Chloride: 103 mEq/L (ref 98–109)
Creatinine: 1.2 mg/dL (ref 0.7–1.3)
EGFR: 56 mL/min/{1.73_m2} — AB (ref >60)
GLUCOSE: 91 mg/dL (ref 70–140)
Potassium: 4.3 mEq/L (ref 3.5–5.1)
SODIUM: 139 meq/L (ref 136–145)
TOTAL PROTEIN: 7.7 g/dL (ref 6.4–8.3)

## 2017-01-18 LAB — CBC WITH DIFFERENTIAL/PLATELET
BASO%: 0.2 % (ref 0.0–2.0)
Basophils Absolute: 0 10*3/uL (ref 0.0–0.1)
EOS%: 3.3 % (ref 0.0–7.0)
Eosinophils Absolute: 0.3 10*3/uL (ref 0.0–0.5)
HEMATOCRIT: 43.8 % (ref 38.4–49.9)
HGB: 14.3 g/dL (ref 13.0–17.1)
LYMPH#: 2 10*3/uL (ref 0.9–3.3)
LYMPH%: 22.6 % (ref 14.0–49.0)
MCH: 32 pg (ref 27.2–33.4)
MCHC: 32.6 g/dL (ref 32.0–36.0)
MCV: 98 fL (ref 79.3–98.0)
MONO#: 0.9 10*3/uL (ref 0.1–0.9)
MONO%: 10.3 % (ref 0.0–14.0)
NEUT#: 5.5 10*3/uL (ref 1.5–6.5)
NEUT%: 63.6 % (ref 39.0–75.0)
PLATELETS: 171 10*3/uL (ref 140–400)
RBC: 4.47 10*6/uL (ref 4.20–5.82)
RDW: 14.6 % (ref 11.0–14.6)
WBC: 8.7 10*3/uL (ref 4.0–10.3)

## 2017-01-18 NOTE — Telephone Encounter (Signed)
Gave relative avs report and appointments for December

## 2017-01-18 NOTE — Progress Notes (Signed)
Hematology and Oncology Follow Up Visit  Craig [name] [id number] September 02, 1924 83 y.o. 01/18/2017 4:20 PM [name], [name], DO   Principle Diagnosis: 83 year old with Castration resistant prostate cancer with metastatic disease to the bone. He was initially diagnosed in 2011 PSA of 19 and presented with advanced disease.   Prior Therapy: He is status post combined androgen deprivation with Lupron and Casodex with an excellent response initially and had a PSA nadir down to 1.67. Most recently he developed progression of disease and a PSA up to 5.94 in September of 2014. He is status post SRS treatment to the L4 completed on 02/13/2014. He is S/P stereotactic body radiotherapy to the iliac bone treatment completed in 02/2014. He is status post radiation therapy to the right femur completed on 06/07/2014.  Current therapy: He is on Xtandi started on 11/15/2012. He was started on 160 mg initially but the dose was reduced to 80 mg in December of 2014. Treatment has been on hold for 2 months. Xtandi resumed at 40 mg daily starting on 06/12/2014.  He is on Lupron and Xgeva done at [facility].  .  Interim History: Craig [name] presents today for a followup visit with his wife. Since the last visit, he reports doing reasonably well without any complaints.   He continues to take Xtandi at the current dose without any new side effects. He denied any nausea or abdominal distention. He denied any recent hospitalization or illnesses. He continues to have a Foley catheter in place that is replaced today and monthly after that.  He denied any excessive pain or pathological fractures.  He denies any increase in his lower extremity edema.  He does not report any headaches, blurry vision, syncope or seizures. He does not report any chest pain, palpitation orthopnea. Has not reported any cough or hemoptysis or hematemesis. Does not report any nausea or vomiting or abdominal pain.  Does not report any constipation or diarrhea. Does not report any hematochezia or melena. Does not report any skin rashes or lesions. Rest of his review of system is unremarkable.  Medications: I have reviewed the patient's current medications.  Current Outpatient Medications  Medication Sig Dispense Refill  . acetaminophen (TYLENOL) 500 MG tablet Take 1,000 mg by mouth daily.     Marland Kitchen amLODipine (NORVASC) 10 MG tablet Take 1 tablet (10 mg total) by mouth daily. 90 tablet 3  . atorvastatin (LIPITOR) 10 MG tablet Take 1 tablet (10 mg total) by mouth daily. 90 tablet 3  . B Complex-C (B-COMPLEX WITH VITAMIN C) tablet Take 1 tablet by mouth daily.      . Denosumab (XGEVA [place]) Inject into the skin every 30 (thirty) days. Monthly. Last inj was 8-19- 2016. Wife not sure what dose it is    . docusate sodium (COLACE) 100 MG capsule Take 100 mg by mouth 2 (two) times daily.    Marland Kitchen ezetimibe (ZETIA) 10 MG tablet Take 1 tablet (10 mg total) by mouth daily. 90 tablet 3  . furosemide (LASIX) 40 MG tablet Take 1 tablet (40 mg total) by mouth daily. 90 tablet 3  . Leuprolide Acetate (LUPRON DEPOT IM) Inject 1 each into the muscle every 6 (six) months. Last inj was in March 2016    . losartan (COZAAR) 100 MG tablet Take 1 tablet (100 mg total) by mouth daily. 90 tablet 3  . multivitamin (THERAGRAN) per tablet Take 1 tablet by mouth daily. ( with B - Complex )    .  Omega-3 Fatty Acids (FISH OIL PO) Take 1 capsule by mouth daily. ( Mega Red )    . oxybutynin (DITROPAN) 5 MG tablet as needed.     . Probiotic Product (PROBIOTIC ADVANCED PO) Take 1 capsule by mouth daily.    . ranitidine (ZANTAC) 150 MG tablet Take 150 mg by mouth at bedtime.    Marland Kitchen warfarin (COUMADIN) 5 MG tablet Take as directed by coumadin clinic 30 tablet 3  . XTANDI 40 MG capsule TAKE 1 CAPSULE BY MOUTH ONCE DAILY 30 capsule 0   No current facility-administered medications for this visit.      Allergies:  Allergies  Allergen Reactions  .  Pravachol Other (See Comments)    Muscle weakness  . Zocor [Simvastatin - High Dose] Other (See Comments)    Muscle weakness    Past Medical History, Surgical history, Social history, and Family History were reviewed and updated.   Physical Exam: Blood pressure (!) 181/74, pulse (!) 52, temperature 97.7 F (36.5 C), temperature source Oral, resp. rate 18, height 6' (1.829 m), SpO2 97 %. ECOG: 2 General appearance: Alert, awake gentleman in a wheelchair. Head: Normocephalic, without obvious abnormality. No oral thrush or ulcers. Neck: no adenopathy or masses. Lymph nodes: Cervical, supraclavicular, and axillary nodes normal. Heart:  Irregular rhythm without any murmurs or gallops. Lung:chest clear, no wheezing, rales, no dullness to percussion. Abdomin: soft, non-tender, without masses or organomegaly. No shifting dullness or ascites. EXT: No cyanosis or edema noted. Neurological examination: No deficits noted.  Lab Results: Lab Results  Component Value Date   WBC 8.7 01/18/2017   HGB 14.3 01/18/2017   HCT 43.8 01/18/2017   MCV 98.0 01/18/2017   PLT 171 01/18/2017     Chemistry      Component Value Date/Time   NA 139 12/17/2016 1127   K 4.5 12/17/2016 1127   CL 100 07/21/2016 1150   CO2 27 12/17/2016 1127   BUN 40.1 (H) 12/17/2016 1127   CREATININE 1.1 12/17/2016 1127      Component Value Date/Time   CALCIUM 10.6 (H) 12/17/2016 1127   ALKPHOS 57 12/17/2016 1127   AST 12 12/17/2016 1127   ALT 8 12/17/2016 1127   BILITOT 0.54 12/17/2016 1127        Results for Craig [name], Craig [name] (MRN [id number]) as of 01/18/2017 16:08  Ref. Range 11/16/2016 11:38 12/17/2016 11:27  Prostate Specific Ag, Serum Latest Ref Range: 0.0 - 4.0 ng/mL 47.1 (H) 47.1 (H)    Impression and Plan:  83 year old gentleman with the following issues:   1. Castration resistant prostate cancer with metastatic disease to the bone. He started on xtandi in September 2014 and have required dose  reduction to 40 mg since that time.   Bone scan obtained on 10/04/2017 showed no major changes.  His PSA remains unchanged in the last month and very little change over the last few months.  The plan is to continue with Xtandi at this time and repeat bone scan in February 2019.  This will be done sooner if there is any reason or indication to do so.  2. Androgen depravation: He is to continue Lupron at this time. He receives this under the care of Dr. Junious Silk.  He received this today.  3. Bone directed therapy: continues to be on Xgeva and have tolerated it well. He recommended continuing this for the time being.  4. Hypercalcemia: His calcium remains stable at this time.  5. Bony metastasis: Status post radiation therapy  in the past no symptoms at this time.  6. Scalp melanoma: No new lesions are noted at this time.  7. Followup: In 4 weeks to follow his progress.  Zola Button, MD 11/27/20184:20 PM

## 2017-01-19 ENCOUNTER — Telehealth: Payer: Self-pay | Admitting: *Deleted

## 2017-01-19 LAB — PSA: PROSTATE SPECIFIC AG, SERUM: 63.8 ng/mL — AB (ref 0.0–4.0)

## 2017-01-19 NOTE — Telephone Encounter (Signed)
-----   Message from Wyatt Portela, MD sent at 01/19/2017  8:39 AM EST ----- Please let his wife know his PSA went up. No change for now, we will continue to follow.

## 2017-01-19 NOTE — Telephone Encounter (Signed)
As noted below by Dr. Shadad, I informed patient of his PSA level. He verbalized understanding.  

## 2017-01-23 NOTE — Progress Notes (Signed)
Cardiology Office Note   Date:  01/25/2017   ID:  Craig Ollinger., DOB Feb 18, 1925, MRN [id number]  PCP:  Mellody Dance, DO    No chief complaint on file.  AFib  Wt Readings from Last 3 Encounters:  07/15/15 200 lb (90.7 kg)  04/14/15 200 lb (90.7 kg)  02/07/15 200 lb (90.7 kg)       History of Present Illness: Craig [name]. is a 83 y.o. 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  He has seen Dr. Mare Ferrari in Craig past. Craig [name] was hospitalized in 2016 after [name] had coughed up blood as noticed by [name]'s wife. . In Craig ER CT angiogram shows chronic pulmonary embolism. He was treated with IV heparin and then transition back to Coumadin and was discharged on Coumadin.  Now his Coumadin is checked at home. Range is 1.8 to 2.2.   Craig [name] is in chronic atrial fibrillation.  He was hospitalized overnight from 12/31/14 until 01/01/15 because of a TIA. This occurred when his INR was 1.7. No changes were made in his regimen.   He is on XTandi for his metastatic prostate cancer. He gets some swelling from Craig prostate cancer medication.  Improves with leg elevation.  PSA has increased to 29.8.  THey are continuing with Craig current meds.  He has had some intermittent SHOB.     Past Medical History:  Diagnosis Date  . Atrial fibrillation ([place])   . Chronic back pain   . GERD (gastroesophageal reflux disease)   . H/O hiatal hernia   . History of epistaxis 07/19/2002  . Hyperlipidemia   . Hypertension   . Hypertensive heart disease   . Hypokalemia   . ICH (intracerebral hemorrhage) ([place]) ~ 1999   after TPA/notes 09/27/2012  . Melanoma ([place])    "top of my head" (09/27/2012)  . Migraines    "migraines without headaches years ago" (09/27/2012)  .  Osteoarthritis of both feet   . PAT (paroxysmal atrial tachycardia) ([place])   . Personal history of long-term (current) use of anticoagulants   . Pneumonia    "once; several years ago" (09/27/2012)  . Prostate cancer, primary, with metastasis from prostate to other site [facility])    on Lupron injections per GU  . S/P radiation therapy 02/13/14-02/27/14   SRS 5/5 spine completed 02/27/14  . S/P radiation therapy 05/27/14-05/31/14   right femur 20Gy/69fx  . Stroke [facility]) ~ 1999   ischemic / right cerebellar/posterior inferior cerebellar artery / right pos infarct    Past Surgical History:  Procedure Laterality Date  . CATARACT EXTRACTION W/ INTRAOCULAR LENS  IMPLANT, BILATERAL Bilateral ~ 2011  . MELANOMA EXCISION  2010   "pre-melanoma on top of head; did skin graft from left thigh to cover" (09/27/2012)  . NASAL SINUS SURGERY  1998  . SKIN GRAFT Right 2010  . TONSILLECTOMY  ~ 1935     Current Outpatient Medications  Medication Sig Dispense Refill  . acetaminophen (TYLENOL) 500 MG tablet Take 1,000 mg by mouth daily.     Marland Kitchen amLODipine (NORVASC) 10 MG tablet Take 1 tablet (10 mg total) by mouth daily. 90 tablet 3  . atorvastatin (LIPITOR) 10 MG tablet Take 1 tablet (10 mg total) by mouth daily. 90 tablet 3  . B Complex-C (B-COMPLEX WITH VITAMIN C) tablet Take  1 tablet by mouth daily.      . Denosumab (XGEVA [place]) Inject into Craig skin every 30 (thirty) days. Monthly. Last inj was 8-19- 2016. Wife not sure what dose it is    . docusate sodium (COLACE) 100 MG capsule Take 100 mg by mouth 2 (two) times daily.    Marland Kitchen ezetimibe (ZETIA) 10 MG tablet Take 1 tablet (10 mg total) by mouth daily. 90 tablet 3  . furosemide (LASIX) 40 MG tablet Take 1 tablet (40 mg total) by mouth daily. 90 tablet 3  . Leuprolide Acetate (LUPRON DEPOT IM) Inject 1 each into Craig muscle every 6 (six) months. Last inj was in March 2016    . losartan (COZAAR) 100 MG tablet Take 1 tablet (100 mg total) by mouth daily. 90 tablet 3  .  multivitamin (THERAGRAN) per tablet Take 1 tablet by mouth daily. ( with B - Complex )    . Omega-3 Fatty Acids (FISH OIL PO) Take 1 capsule by mouth daily. ( Mega Red )    . oxybutynin (DITROPAN) 5 MG tablet as needed.     . Probiotic Product (PROBIOTIC ADVANCED PO) Take 1 capsule by mouth daily.    . ranitidine (ZANTAC) 150 MG tablet Take 150 mg by mouth at bedtime.    Marland Kitchen warfarin (COUMADIN) 5 MG tablet Take as directed by coumadin clinic 30 tablet 3  . XTANDI 40 MG capsule TAKE 1 CAPSULE BY MOUTH ONCE DAILY 30 capsule 0   No current facility-administered medications for this visit.     Allergies:   Pravachol and Zocor [simvastatin - high dose]    Social History:  Craig [name]  reports that he quit smoking about 63 years ago. His smoking use included cigarettes. He quit after 0.50 years of use. he has never used smokeless tobacco. He reports that he does not drink alcohol or use drugs.   Family History:  Craig [name]'s family history includes Heart attack in his father; Heart failure in his mother.    ROS:  Please see Craig history of present illness.   Otherwise, review of systems are positive for LE swelling, Right toe ulcer.   All other systems are reviewed and negative.    PHYSICAL EXAM: VS:  BP (!) 170/72   Pulse 60   Ht 6' (1.829 m)   SpO2 97%   BMI 27.12 kg/m  , BMI Body mass index is 27.12 kg/m. GEN: Well nourished, well developed, in no acute distress  HEENT: normal  Neck: no JVD, carotid bruits, or masses Cardiac: irregularly irregular; 2/6 systolic murmur;  no rubs, or gallops,no edema  Respiratory:  clear to auscultation bilaterally, normal work of breathing GI: soft, nontender, nondistended, + BS MS: no deformity or atrophy  Skin: warm and dry, no rash; ulcer on Craig right big toe tip Neuro:  Strength and sensation are intact Psych: euthymic mood, full affect   EKG:   Craig ekg ordered today demonstrates AFib, rate controlled. Poor R wave progression   Recent  Labs: 01/18/2017: ALT 10; BUN 41.4; Creatinine 1.2; HGB 14.3; Platelets 171; Potassium 4.3; Sodium 139   Lipid Panel    Component Value Date/Time   CHOL 161 07/21/2016 1150   TRIG 157 (H) 07/21/2016 1150   HDL 49 07/21/2016 1150   CHOLHDL 3.3 07/21/2016 1150   CHOLHDL 2.4 07/15/2015 1156   VLDL 26 07/15/2015 1156   LDLCALC 81 07/21/2016 1150     Other studies Reviewed: Additional studies/ records that were reviewed  today with results demonstrating: Labs reviewed..   ASSESSMENT AND PLAN:  1.  AFib: Rate controled. COntinue current meds. Hypertensive heart disease.  Chcek Bp at home.  Readings are better there.  Wife will keep a log.  2. Anticoagulated: Warfarin for stroke prevention.No bleeding problems.  3. CKD III: Cr 1.2 in 11/18. 4. Aortic atherosclerosis:  Continue lipid-lowering therapy. 5. Mild AS: Murmur present. Not very active.  Does not walk.  Only stands occasionally.  6. Prior PE: Hemoptysis with PE in Craig past.   7. Toe ulcer: present for 2-3 weeks.  If it gets worse, would refer to wound center.  Wife will keep [place] informed.  Started after cutting toenail and had some bleeding under Craig skin.  I don't think he would be candidate for invasive PAD testing. Conservative Rx to prevent infection in toe.    Current medicines are reviewed at length with Craig [name] today.  Craig [name] concerns regarding his medicines were addressed.  Craig following changes have been made:  No change  Labs/ tests ordered today include:  Orders Placed This Encounter  Procedures  . EKG 12-Lead    Recommend 150 minutes/week of aerobic exercise Low fat, low carb, high fiber diet recommended  Disposition:   FU in 6 months   Signed, Larae Grooms, MD  01/25/2017 1:07 PM    [facility] [address], [place], [place]  [address] Phone: [phone]; Fax: [phone]

## 2017-01-24 ENCOUNTER — Ambulatory Visit (INDEPENDENT_AMBULATORY_CARE_PROVIDER_SITE_OTHER): Payer: Medicare Other | Admitting: Cardiology

## 2017-01-24 DIAGNOSIS — I482 Chronic atrial fibrillation, unspecified: Secondary | ICD-10-CM

## 2017-01-24 DIAGNOSIS — Z5181 Encounter for therapeutic drug level monitoring: Secondary | ICD-10-CM | POA: Diagnosis not present

## 2017-01-24 LAB — POCT INR: INR: 1.9

## 2017-01-25 ENCOUNTER — Encounter: Payer: Self-pay | Admitting: Interventional Cardiology

## 2017-01-25 ENCOUNTER — Ambulatory Visit: Payer: Medicare Other | Admitting: Interventional Cardiology

## 2017-01-25 VITALS — BP 170/72 | HR 60 | Ht 72.0 in

## 2017-01-25 DIAGNOSIS — I350 Nonrheumatic aortic (valve) stenosis: Secondary | ICD-10-CM | POA: Diagnosis not present

## 2017-01-25 DIAGNOSIS — I700 Atherosclerosis of aorta: Secondary | ICD-10-CM

## 2017-01-25 DIAGNOSIS — I119 Hypertensive heart disease without heart failure: Secondary | ICD-10-CM

## 2017-01-25 DIAGNOSIS — L97511 Non-pressure chronic ulcer of other part of right foot limited to breakdown of skin: Secondary | ICD-10-CM

## 2017-01-25 DIAGNOSIS — N289 Disorder of kidney and ureter, unspecified: Secondary | ICD-10-CM | POA: Diagnosis not present

## 2017-01-25 DIAGNOSIS — I482 Chronic atrial fibrillation, unspecified: Secondary | ICD-10-CM

## 2017-01-25 NOTE — Patient Instructions (Signed)

## 2017-02-04 DIAGNOSIS — I480 Paroxysmal atrial fibrillation: Secondary | ICD-10-CM | POA: Diagnosis not present

## 2017-02-07 ENCOUNTER — Ambulatory Visit (INDEPENDENT_AMBULATORY_CARE_PROVIDER_SITE_OTHER): Payer: Medicare Other | Admitting: Interventional Cardiology

## 2017-02-07 DIAGNOSIS — Z5181 Encounter for therapeutic drug level monitoring: Secondary | ICD-10-CM

## 2017-02-07 DIAGNOSIS — I482 Chronic atrial fibrillation, unspecified: Secondary | ICD-10-CM

## 2017-02-07 DIAGNOSIS — Z8673 Personal history of transient ischemic attack (TIA), and cerebral infarction without residual deficits: Secondary | ICD-10-CM

## 2017-02-07 DIAGNOSIS — G464 Cerebellar stroke syndrome: Secondary | ICD-10-CM | POA: Diagnosis not present

## 2017-02-07 LAB — POCT INR: INR: 2

## 2017-02-16 ENCOUNTER — Other Ambulatory Visit: Payer: Self-pay | Admitting: Oncology

## 2017-02-16 DIAGNOSIS — C61 Malignant neoplasm of prostate: Secondary | ICD-10-CM

## 2017-02-16 DIAGNOSIS — C7951 Secondary malignant neoplasm of bone: Secondary | ICD-10-CM

## 2017-02-18 ENCOUNTER — Other Ambulatory Visit (HOSPITAL_BASED_OUTPATIENT_CLINIC_OR_DEPARTMENT_OTHER): Payer: Medicare Other

## 2017-02-18 ENCOUNTER — Telehealth: Payer: Self-pay | Admitting: Oncology

## 2017-02-18 ENCOUNTER — Ambulatory Visit: Payer: Medicare Other | Admitting: Oncology

## 2017-02-18 VITALS — BP 151/72 | HR 64 | Temp 96.9°F | Resp 18 | Ht 72.0 in

## 2017-02-18 DIAGNOSIS — E291 Testicular hypofunction: Secondary | ICD-10-CM | POA: Diagnosis not present

## 2017-02-18 DIAGNOSIS — Z5111 Encounter for antineoplastic chemotherapy: Secondary | ICD-10-CM | POA: Diagnosis not present

## 2017-02-18 DIAGNOSIS — C61 Malignant neoplasm of prostate: Secondary | ICD-10-CM

## 2017-02-18 DIAGNOSIS — C7951 Secondary malignant neoplasm of bone: Secondary | ICD-10-CM

## 2017-02-18 DIAGNOSIS — R338 Other retention of urine: Secondary | ICD-10-CM | POA: Diagnosis not present

## 2017-02-18 DIAGNOSIS — C434 Malignant melanoma of scalp and neck: Secondary | ICD-10-CM | POA: Diagnosis not present

## 2017-02-18 LAB — CBC WITH DIFFERENTIAL/PLATELET
BASO%: 0.2 % (ref 0.0–2.0)
BASOS ABS: 0 10*3/uL (ref 0.0–0.1)
EOS%: 2.4 % (ref 0.0–7.0)
Eosinophils Absolute: 0.2 10*3/uL (ref 0.0–0.5)
HEMATOCRIT: 43.4 % (ref 38.4–49.9)
HGB: 14.2 g/dL (ref 13.0–17.1)
LYMPH#: 1.6 10*3/uL (ref 0.9–3.3)
LYMPH%: 20.3 % (ref 14.0–49.0)
MCH: 32.1 pg (ref 27.2–33.4)
MCHC: 32.7 g/dL (ref 32.0–36.0)
MCV: 98.2 fL — ABNORMAL HIGH (ref 79.3–98.0)
MONO#: 0.8 10*3/uL (ref 0.1–0.9)
MONO%: 9.7 % (ref 0.0–14.0)
NEUT#: 5.4 10*3/uL (ref 1.5–6.5)
NEUT%: 67.4 % (ref 39.0–75.0)
Platelets: 123 10*3/uL — ABNORMAL LOW (ref 140–400)
RBC: 4.42 10*6/uL (ref 4.20–5.82)
RDW: 14.5 % (ref 11.0–14.6)
WBC: 8.1 10*3/uL (ref 4.0–10.3)

## 2017-02-18 LAB — COMPREHENSIVE METABOLIC PANEL
ALT: 9 U/L (ref 0–55)
AST: 13 U/L (ref 5–34)
Albumin: 3.4 g/dL — ABNORMAL LOW (ref 3.5–5.0)
Alkaline Phosphatase: 58 U/L (ref 40–150)
Anion Gap: 8 mEq/L (ref 3–11)
BUN: 43.8 mg/dL — AB (ref 7.0–26.0)
CALCIUM: 10.4 mg/dL (ref 8.4–10.4)
CHLORIDE: 104 meq/L (ref 98–109)
CO2: 27 meq/L (ref 22–29)
CREATININE: 1.2 mg/dL (ref 0.7–1.3)
EGFR: 51 mL/min/{1.73_m2} — ABNORMAL LOW (ref >60)
GLUCOSE: 101 mg/dL (ref 70–140)
POTASSIUM: 4.1 meq/L (ref 3.5–5.1)
SODIUM: 139 meq/L (ref 136–145)
Total Bilirubin: 0.51 mg/dL (ref 0.20–1.20)
Total Protein: 7.2 g/dL (ref 6.4–8.3)

## 2017-02-18 NOTE — Telephone Encounter (Signed)
Scheduled appt per 12/28 los - Gave patient AVS and calender per los.

## 2017-02-18 NOTE — Progress Notes (Signed)
Hematology and Oncology Follow Up Visit  Craig [name] [id number] 09/11/1924 83 y.o. 02/18/2017 11:55 AM [name], [name], DO   Principle Diagnosis: 83 year old with Castration resistant prostate cancer with metastatic disease to the bone. He was initially diagnosed in 2011 PSA of 19 and presented with advanced disease.   Prior Therapy: He is status post combined androgen deprivation with Lupron and Casodex with an excellent response initially and had a PSA nadir down to 1.67. Most recently he developed progression of disease and a PSA up to 5.94 in September of 2014. He is status post SRS treatment to the L4 completed on 02/13/2014. He is S/P stereotactic body radiotherapy to the iliac bone treatment completed in 02/2014. He is status post radiation therapy to the right femur completed on 06/07/2014.  Current therapy: He is on Xtandi started on 11/15/2012. He was started on 160 mg initially but the dose was reduced to 80 mg in December of 2014. Treatment has been on hold for 2 months. Xtandi resumed at 40 mg daily starting on 06/12/2014.  He is on Lupron and Xgeva done at [facility].  .  Interim History: Craig [name] presents today for a followup visit with his wife. Since the last visit, he reports no changes in his health.  He continues to be limited in his mobility with predominantly in a wheelchair.  He still relying on his wife for most activities of daily living.  He continues to take Xtandi at the current dose without any new side effects. He denied any nausea or abdominal distention.  He denies any excessive fatigue or tiredness.  He denies any bone pain or pathological fractures.  He denied any need for trips to the emergency department or hospitalizations.     He continues to have a Foley catheter in place that is replaced today and received Xgeva at [facility].  He denies any complications related to that.  His appetite remains reasonable and  his quality of life although remains limited is unchanged.  He does not report any headaches, blurry vision, syncope or seizures. He does not report any chest pain, palpitation orthopnea. Has not reported any cough or hemoptysis or hematemesis. Does not report any nausea or vomiting or abdominal pain. Does not report any constipation or diarrhea. Does not report any hematochezia or melena. Does not report any skin rashes or lesions. Rest of his review of system is unremarkable.  Medications: I have reviewed the patient's current medications.  Current Outpatient Medications  Medication Sig Dispense Refill  . acetaminophen (TYLENOL) 500 MG tablet Take 1,000 mg by mouth daily.     Marland Kitchen amLODipine (NORVASC) 10 MG tablet Take 1 tablet (10 mg total) by mouth daily. 90 tablet 3  . atorvastatin (LIPITOR) 10 MG tablet Take 1 tablet (10 mg total) by mouth daily. 90 tablet 3  . B Complex-C (B-COMPLEX WITH VITAMIN C) tablet Take 1 tablet by mouth daily.      . Denosumab (XGEVA [place]) Inject into the skin every 30 (thirty) days. Monthly. Last inj was 8-19- 2016. Wife not sure what dose it is    . docusate sodium (COLACE) 100 MG capsule Take 100 mg by mouth 2 (two) times daily.    Marland Kitchen ezetimibe (ZETIA) 10 MG tablet Take 1 tablet (10 mg total) by mouth daily. 90 tablet 3  . furosemide (LASIX) 40 MG tablet Take 1 tablet (40 mg total) by mouth daily. 90 tablet 3  . Leuprolide Acetate (LUPRON DEPOT  IM) Inject 1 each into the muscle every 6 (six) months. Last inj was in March 2016    . losartan (COZAAR) 100 MG tablet Take 1 tablet (100 mg total) by mouth daily. 90 tablet 3  . multivitamin (THERAGRAN) per tablet Take 1 tablet by mouth daily. ( with B - Complex )    . Omega-3 Fatty Acids (FISH OIL PO) Take 1 capsule by mouth daily. ( Mega Red )    . oxybutynin (DITROPAN) 5 MG tablet as needed.     . Probiotic Product (PROBIOTIC ADVANCED PO) Take 1 capsule by mouth daily.    . ranitidine (ZANTAC) 150 MG tablet Take 150 mg by  mouth at bedtime.    Marland Kitchen warfarin (COUMADIN) 5 MG tablet Take as directed by coumadin clinic 30 tablet 3  . XTANDI 40 MG capsule TAKE 1 CAPSULE BY MOUTH ONCE DAILY 30 capsule 0   No current facility-administered medications for this visit.      Allergies:  Allergies  Allergen Reactions  . Pravachol Other (See Comments)    Muscle weakness  . Zocor [Simvastatin - High Dose] Other (See Comments)    Muscle weakness    Past Medical History, Surgical history, Social history, and Family History were reviewed and updated.   Physical Exam: Blood pressure (!) 151/72, pulse 64, temperature (!) 96.9 F (36.1 C), temperature source Oral, resp. rate 18, height 6' (1.829 m), SpO2 100 %. ECOG: 2 General appearance: Comfortable appearing gentleman without distress. Head: Normocephalic, without obvious abnormality. No oral ulcers or lesions. Neck: no adenopathy or masses. Lymph nodes: Cervical, supraclavicular, and axillary nodes normal. Heart:  Irregular rhythm without any murmurs or gallops. Lung:chest clear, no wheezing, rales, no dullness to percussion. Abdomin: soft, non-tender, without masses or organomegaly. No rebound or guarding. EXT: No cyanosis or edema noted. Neurological examination: No deficits noted.  Lab Results: Lab Results  Component Value Date   WBC 8.1 02/18/2017   HGB 14.2 02/18/2017   HCT 43.4 02/18/2017   MCV 98.2 (H) 02/18/2017   PLT 123 (L) 02/18/2017     Chemistry      Component Value Date/Time   NA 139 01/18/2017 1550   K 4.3 01/18/2017 1550   CL 100 07/21/2016 1150   CO2 26 01/18/2017 1550   BUN 41.4 (H) 01/18/2017 1550   CREATININE 1.2 01/18/2017 1550      Component Value Date/Time   CALCIUM 10.7 (H) 01/18/2017 1550   ALKPHOS 62 01/18/2017 1550   AST 12 01/18/2017 1550   ALT 10 01/18/2017 1550   BILITOT 0.53 01/18/2017 1550       Results for Craig [name] (MRN [id number]) as of 02/18/2017 11:45  Ref. Range 11/16/2016 11:38 12/17/2016  11:27 01/18/2017 15:50  Prostate Specific Ag, Serum Latest Ref Range: 0.0 - 4.0 ng/mL 47.1 (H) 47.1 (H) 63.8 (H)      Impression and Plan:  83 year old gentleman with the following issues:   1. Castration resistant prostate cancer with metastatic disease to the bone.   He started on Xtandi in September 2014 and have required dose reduction to 40 mg since that time.   Bone scan obtained on 10/04/2017 showed no major changes.  His PSA in November 2018 did increase to 63.8.  Despite the rise in his PSA, he continues to be asymptomatic without any signs of progression of disease.  Risks and benefits of continuing this medication was discussed today and is agreeable to continue.  He will have repeat bone scan  in February 2019 and will determine whether different salvage therapy will be needed.  His options were reviewed today again with the patient and his family.  This would include Zytiga as well as Xofigo.  2. Androgen depravation: He is to continue Lupron at this time. He receives this under the care of Dr. Junious Silk.  This was completed in November 2018.  3. Bone directed therapy: continues to be on Xgeva and have tolerated it well.  He needs to receive that on a monthly basis under the care of Dr. Junious Silk.  4. Hypercalcemia: His calcium is unchanged based on November 2018 studies.  5. Bony metastasis: Status post radiation therapy in the past no symptoms at this time.  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  7. Followup: In 4 weeks to follow his progress.  Zola Button, MD 12/28/201811:55 AM

## 2017-02-19 LAB — PSA: Prostate Specific Ag, Serum: 66.5 ng/mL — ABNORMAL HIGH (ref 0.0–4.0)

## 2017-02-21 ENCOUNTER — Ambulatory Visit (INDEPENDENT_AMBULATORY_CARE_PROVIDER_SITE_OTHER): Payer: Medicare Other | Admitting: Internal Medicine

## 2017-02-21 ENCOUNTER — Telehealth: Payer: Self-pay | Admitting: *Deleted

## 2017-02-21 DIAGNOSIS — G464 Cerebellar stroke syndrome: Secondary | ICD-10-CM

## 2017-02-21 DIAGNOSIS — Z8673 Personal history of transient ischemic attack (TIA), and cerebral infarction without residual deficits: Secondary | ICD-10-CM

## 2017-02-21 DIAGNOSIS — I482 Chronic atrial fibrillation, unspecified: Secondary | ICD-10-CM

## 2017-02-21 DIAGNOSIS — Z5181 Encounter for therapeutic drug level monitoring: Secondary | ICD-10-CM | POA: Diagnosis not present

## 2017-02-21 LAB — POCT INR: INR: 2.4

## 2017-02-21 NOTE — Telephone Encounter (Signed)
As noted below by Dr. Alen Blew, I informed wife of PSA level. She verbalized understanding.

## 2017-02-21 NOTE — Patient Instructions (Signed)
Description   Spoke with pt's wife and instructed to have pt take 1/2 tablet (2.5mg ) today Dec 31st then continue on same dosage 1/2 tablet daily except 1 tablet on Mondays and Thursdays. Recheck in 2 weeks-Self-tester. Call with any concerns new medication or if scheduled for any procedures [phone]

## 2017-02-21 NOTE — Telephone Encounter (Signed)
-----   Message from Wyatt Portela, MD sent at 02/21/2017 11:06 AM EST ----- Please let his wife know PSA is changed little.

## 2017-02-23 ENCOUNTER — Other Ambulatory Visit: Payer: Self-pay | Admitting: Pharmacist

## 2017-02-28 DIAGNOSIS — Z85820 Personal history of malignant melanoma of skin: Secondary | ICD-10-CM | POA: Diagnosis not present

## 2017-02-28 DIAGNOSIS — Z85828 Personal history of other malignant neoplasm of skin: Secondary | ICD-10-CM | POA: Diagnosis not present

## 2017-02-28 DIAGNOSIS — L308 Other specified dermatitis: Secondary | ICD-10-CM | POA: Diagnosis not present

## 2017-03-06 DIAGNOSIS — I480 Paroxysmal atrial fibrillation: Secondary | ICD-10-CM | POA: Diagnosis not present

## 2017-03-07 ENCOUNTER — Ambulatory Visit (INDEPENDENT_AMBULATORY_CARE_PROVIDER_SITE_OTHER): Payer: Medicare Other | Admitting: Pharmacist

## 2017-03-07 DIAGNOSIS — Z5181 Encounter for therapeutic drug level monitoring: Secondary | ICD-10-CM | POA: Diagnosis not present

## 2017-03-07 DIAGNOSIS — I482 Chronic atrial fibrillation, unspecified: Secondary | ICD-10-CM

## 2017-03-07 LAB — POCT INR: INR: 1.9

## 2017-03-15 ENCOUNTER — Other Ambulatory Visit: Payer: Self-pay | Admitting: Oncology

## 2017-03-15 DIAGNOSIS — C61 Malignant neoplasm of prostate: Secondary | ICD-10-CM

## 2017-03-15 DIAGNOSIS — C7951 Secondary malignant neoplasm of bone: Secondary | ICD-10-CM

## 2017-03-21 ENCOUNTER — Ambulatory Visit (INDEPENDENT_AMBULATORY_CARE_PROVIDER_SITE_OTHER): Payer: Medicare Other | Admitting: Cardiology

## 2017-03-21 DIAGNOSIS — I482 Chronic atrial fibrillation, unspecified: Secondary | ICD-10-CM

## 2017-03-21 DIAGNOSIS — Z8673 Personal history of transient ischemic attack (TIA), and cerebral infarction without residual deficits: Secondary | ICD-10-CM | POA: Diagnosis not present

## 2017-03-21 DIAGNOSIS — Z5181 Encounter for therapeutic drug level monitoring: Secondary | ICD-10-CM | POA: Diagnosis not present

## 2017-03-21 LAB — POCT INR: INR: 1.9

## 2017-03-21 NOTE — Patient Instructions (Signed)
Description   Spoke with pt's wife and instructed to have pt continue on same dosage 1/2 tablet daily except 1 tablet on Mondays and Thursdays. Recheck in 2 weeks-Self-tester. Call with any concerns new medication or if scheduled for any procedures [phone]

## 2017-03-22 ENCOUNTER — Inpatient Hospital Stay: Payer: Medicare Other | Attending: Oncology | Admitting: Oncology

## 2017-03-22 ENCOUNTER — Inpatient Hospital Stay: Payer: Medicare Other

## 2017-03-22 ENCOUNTER — Telehealth: Payer: Self-pay | Admitting: Oncology

## 2017-03-22 VITALS — BP 157/99 | HR 62 | Temp 97.6°F | Resp 18 | Ht 72.0 in

## 2017-03-22 DIAGNOSIS — Z923 Personal history of irradiation: Secondary | ICD-10-CM | POA: Diagnosis not present

## 2017-03-22 DIAGNOSIS — R338 Other retention of urine: Secondary | ICD-10-CM | POA: Diagnosis not present

## 2017-03-22 DIAGNOSIS — C7951 Secondary malignant neoplasm of bone: Principal | ICD-10-CM

## 2017-03-22 DIAGNOSIS — C61 Malignant neoplasm of prostate: Secondary | ICD-10-CM | POA: Diagnosis not present

## 2017-03-22 DIAGNOSIS — Z79899 Other long term (current) drug therapy: Secondary | ICD-10-CM | POA: Diagnosis not present

## 2017-03-22 LAB — CBC WITH DIFFERENTIAL/PLATELET
BASOS ABS: 0 10*3/uL (ref 0.0–0.1)
Basophils Relative: 1 %
EOS PCT: 2 %
Eosinophils Absolute: 0.2 10*3/uL (ref 0.0–0.5)
HEMATOCRIT: 42.5 % (ref 38.4–49.9)
Hemoglobin: 13.9 g/dL (ref 13.0–17.1)
LYMPHS ABS: 1.8 10*3/uL (ref 0.9–3.3)
LYMPHS PCT: 21 %
MCH: 31.7 pg (ref 27.2–33.4)
MCHC: 32.8 g/dL (ref 32.0–36.0)
MCV: 96.9 fL (ref 79.3–98.0)
Monocytes Absolute: 0.9 10*3/uL (ref 0.1–0.9)
Monocytes Relative: 10 %
Neutro Abs: 6 10*3/uL (ref 1.5–6.5)
Neutrophils Relative %: 66 %
PLATELETS: 168 10*3/uL (ref 140–400)
RBC: 4.39 MIL/uL (ref 4.20–5.82)
RDW: 14.9 % (ref 11.0–15.6)
WBC: 9 10*3/uL (ref 4.0–10.3)

## 2017-03-22 LAB — COMPREHENSIVE METABOLIC PANEL
ALT: 8 U/L (ref 0–55)
ANION GAP: 10 (ref 3–11)
AST: 12 U/L (ref 5–34)
Albumin: 3.4 g/dL — ABNORMAL LOW (ref 3.5–5.0)
Alkaline Phosphatase: 60 U/L (ref 40–150)
BILIRUBIN TOTAL: 0.5 mg/dL (ref 0.2–1.2)
BUN: 42 mg/dL — ABNORMAL HIGH (ref 7–26)
CHLORIDE: 104 mmol/L (ref 98–109)
CO2: 27 mmol/L (ref 22–29)
Calcium: 10.7 mg/dL — ABNORMAL HIGH (ref 8.4–10.4)
Creatinine, Ser: 1.24 mg/dL (ref 0.70–1.30)
GFR calc Af Amer: 57 mL/min — ABNORMAL LOW (ref >60)
GFR, EST NON AFRICAN AMERICAN: 49 mL/min — AB (ref >60)
Glucose, Bld: 105 mg/dL (ref 70–140)
POTASSIUM: 4.2 mmol/L (ref 3.5–5.1)
Sodium: 141 mmol/L (ref 136–145)
TOTAL PROTEIN: 7.5 g/dL (ref 6.4–8.3)

## 2017-03-22 NOTE — Progress Notes (Signed)
Hematology and Oncology Follow Up Visit  Craig [name] [id number] Dec 06, 1924 84 y.o. 03/22/2017 12:24 PM [name], [name], DO   Principle Diagnosis: 84 year old man with castration-resistant prostate cancer with metastatic disease to the bone. He was initially diagnosed in 2011 PSA of 19 and presented with advanced disease.   Prior Therapy: He is status post androgen deprivation with Lupron and Casodex with an excellent response initially and had a PSA nadir down to 1.67. He is status post SRS treatment to the L4 completed on 02/13/2014. He is S/P stereotactic body radiotherapy to the iliac bone treatment completed in 02/2014. He is status post radiation therapy to the right femur completed on 06/07/2014.  Current therapy: He is on Xtandi started on 11/15/2012   Xtandi dose reduced to 40 mg daily starting on 06/12/2014.  He is on Lupron and Xgeva done at [facility].  .  Interim History: Craig [name] is here for a follow-up with his wife.  He reports no major changes since the last visit.  He has limited mobility at this time although his mobility has not changed.  He is relying on his wife for most activities of daily living.  He is tolerating Xtandi without any major complications at this time.  He denies any recent falls, syncope or pathological fractures.  His appetite remain excellent and weight is maintained.  His quality of life although poor is not changed.  He does not report any headaches, blurry vision, syncope or seizures. He does not report any chest pain, palpitation orthopnea. Has not reported any cough or hemoptysis or hematemesis. Does not report any nausea or vomiting or abdominal pain. Does not report any constipation or diarrhea. Does not report any hematochezia or melena. Does not report any skin rashes or lesions.  He does not report any heat or cold intolerance.  He does not report any mood changes.  Rest of his review of system is  .  Medications: I have reviewed the patient's current medications.  Current Outpatient Medications  Medication Sig Dispense Refill  . acetaminophen (TYLENOL) 500 MG tablet Take 1,000 mg by mouth daily.     Marland Kitchen amLODipine (NORVASC) 10 MG tablet Take 1 tablet (10 mg total) by mouth daily. 90 tablet 3  . atorvastatin (LIPITOR) 10 MG tablet Take 1 tablet (10 mg total) by mouth daily. 90 tablet 3  . B Complex-C (B-COMPLEX WITH VITAMIN C) tablet Take 1 tablet by mouth daily.      . Denosumab (XGEVA [place]) Inject into the skin every 30 (thirty) days. Monthly. Last inj was 8-19- 2016. Wife not sure what dose it is    . docusate sodium (COLACE) 100 MG capsule Take 100 mg by mouth 2 (two) times daily.    Marland Kitchen ezetimibe (ZETIA) 10 MG tablet Take 1 tablet (10 mg total) by mouth daily. 90 tablet 3  . furosemide (LASIX) 40 MG tablet Take 1 tablet (40 mg total) by mouth daily. 90 tablet 3  . Leuprolide Acetate (LUPRON DEPOT IM) Inject 1 each into the muscle every 6 (six) months. Last inj was in March 2016    . losartan (COZAAR) 100 MG tablet Take 1 tablet (100 mg total) by mouth daily. 90 tablet 3  . multivitamin (THERAGRAN) per tablet Take 1 tablet by mouth daily. ( with B - Complex )    . Omega-3 Fatty Acids (FISH OIL PO) Take 1 capsule by mouth daily. ( Mega Red )    . oxybutynin (DITROPAN)  5 MG tablet as needed.     . Probiotic Product (PROBIOTIC ADVANCED PO) Take 1 capsule by mouth daily.    . ranitidine (ZANTAC) 150 MG tablet Take 150 mg by mouth at bedtime.    Marland Kitchen warfarin (COUMADIN) 5 MG tablet Take as directed by coumadin clinic 30 tablet 3  . XTANDI 40 MG capsule TAKE 1 CAPSULE BY MOUTH ONCE DAILY 30 capsule 0   No current facility-administered medications for this visit.      Allergies:  Allergies  Allergen Reactions  . Pravachol Other (See Comments)    Muscle weakness  . Zocor [Simvastatin - High Dose] Other (See Comments)    Muscle weakness    Past Medical History, Surgical history, Social  history, and Family History updated and unchanged.   Physical Exam: Blood pressure (!) 157/99, pulse 62, temperature 97.6 F (36.4 C), temperature source Oral, resp. rate 18, height 6' (1.829 m), SpO2 98 %. ECOG: 2 General appearance: Alert, awake gentleman appeared comfortable without distress. Head: Normocephalic, without obvious abnormality.  Oropharynx: No oral ulcers or lesions. Eyes: No scleral icterus. Lymph nodes: Cervical, supraclavicular, and axillary nodes normal. Heart:  Irregular rhythm without any murmurs or gallops. Lung:chest clear in all lung fields without wheezing. Abdomin: soft, non-tender, without masses or organomegaly.  Good bowel sounds in all 4 quadrants. Musculoskeletal: No joint deformity or effusion. Skin: No rash or lesions. Neurological examination: No deficits noted.  Lab Results: Lab Results  Component Value Date   WBC 8.1 02/18/2017   HGB 14.2 02/18/2017   HCT 43.4 02/18/2017   MCV 98.2 (H) 02/18/2017   PLT 123 (L) 02/18/2017     Chemistry      Component Value Date/Time   NA 139 02/18/2017 1125   K 4.1 02/18/2017 1125   CL 100 07/21/2016 1150   CO2 27 02/18/2017 1125   BUN 43.8 (H) 02/18/2017 1125   CREATININE 1.2 02/18/2017 1125      Component Value Date/Time   CALCIUM 10.4 02/18/2017 1125   ALKPHOS 58 02/18/2017 1125   AST 13 02/18/2017 1125   ALT 9 02/18/2017 1125   BILITOT 0.51 02/18/2017 1125         Impression and Plan:  84 year old gentleman with the following issues:   1. Castration resistant prostate cancer with metastatic disease to the bone.  He was initially diagnosed in 2011 but developed castration resistant disease in 2014.  He started on Xtandi in September 2014 and have required dose reduction to 40 mg since that time.   Bone scan obtained on 10/04/2017 was reviewed again and showed no major changes.  The natural course of this disease was reviewed again with the patient and his wife.  Despite the rise in  his PSA which currently at 3 he does not report any clinical deterioration.  I have recommended repeating a bone scan before the next visit and consider different salvage therapy if he has rapid progression.  At this time we will keep Xtandi at the same dose and schedule.  2. Androgen depravation: I recommended continuing Lupron indefinitely. He receives this under the care of Dr. Junious Silk.    3. Bone directed therapy: He is at risk of developing pathological fractures and I recommended continuing Xgeva indefinitely for the time being.  Continues to receive that under the care of Dr. Junious Silk.    4. Followup: In 4 weeks after repeating a bone scan.  15  minutes was spent with the patient face-to-face today.  More than  50% of time was dedicated to patient counseling, education and coordination of his multifaceted care.   Zola Button, MD 1/29/201912:24 PM

## 2017-03-22 NOTE — Telephone Encounter (Signed)
Scheduled appt per 1/29 los - Gave patient AVS And calender per los.  

## 2017-03-23 ENCOUNTER — Telehealth: Payer: Self-pay | Admitting: *Deleted

## 2017-03-23 LAB — PROSTATE-SPECIFIC AG, SERUM (LABCORP): PROSTATE SPECIFIC AG, SERUM: 69.9 ng/mL — AB (ref 0.0–4.0)

## 2017-03-23 NOTE — Telephone Encounter (Signed)
Spoke with wife betty, gave results of last PSA

## 2017-03-23 NOTE — Telephone Encounter (Signed)
-----   Message from Wyatt Portela, MD sent at 03/23/2017  8:07 AM EST ----- Please let him know his PSA has not changed much.

## 2017-04-04 ENCOUNTER — Ambulatory Visit (INDEPENDENT_AMBULATORY_CARE_PROVIDER_SITE_OTHER): Payer: Medicare Other | Admitting: Pharmacist

## 2017-04-04 DIAGNOSIS — I482 Chronic atrial fibrillation, unspecified: Secondary | ICD-10-CM

## 2017-04-04 DIAGNOSIS — Z5181 Encounter for therapeutic drug level monitoring: Secondary | ICD-10-CM

## 2017-04-04 LAB — POCT INR: INR: 1.9

## 2017-04-05 DIAGNOSIS — I480 Paroxysmal atrial fibrillation: Secondary | ICD-10-CM | POA: Diagnosis not present

## 2017-04-12 ENCOUNTER — Other Ambulatory Visit: Payer: Self-pay | Admitting: Oncology

## 2017-04-12 DIAGNOSIS — C61 Malignant neoplasm of prostate: Secondary | ICD-10-CM

## 2017-04-12 DIAGNOSIS — C7951 Secondary malignant neoplasm of bone: Secondary | ICD-10-CM

## 2017-04-18 ENCOUNTER — Encounter (HOSPITAL_COMMUNITY)
Admission: RE | Admit: 2017-04-18 | Discharge: 2017-04-18 | Disposition: A | Discharge status: Home / Self Care | Payer: Medicare Other | Source: Ambulatory Visit | Attending: Oncology | Admitting: Oncology

## 2017-04-18 ENCOUNTER — Ambulatory Visit (INDEPENDENT_AMBULATORY_CARE_PROVIDER_SITE_OTHER): Payer: Medicare Other | Admitting: Cardiovascular Disease

## 2017-04-18 DIAGNOSIS — Z5181 Encounter for therapeutic drug level monitoring: Secondary | ICD-10-CM | POA: Diagnosis not present

## 2017-04-18 DIAGNOSIS — C61 Malignant neoplasm of prostate: Secondary | ICD-10-CM | POA: Insufficient documentation

## 2017-04-18 DIAGNOSIS — I482 Chronic atrial fibrillation, unspecified: Secondary | ICD-10-CM

## 2017-04-18 DIAGNOSIS — C7951 Secondary malignant neoplasm of bone: Secondary | ICD-10-CM | POA: Insufficient documentation

## 2017-04-18 LAB — POCT INR: INR: 2

## 2017-04-18 MED ORDER — TECHNETIUM TC 99M MEDRONATE IV KIT
20.3000 | PACK | Freq: Once | INTRAVENOUS | Status: AC | PRN
  Administered 2017-04-18: 20.3 via INTRAVENOUS

## 2017-04-22 ENCOUNTER — Inpatient Hospital Stay: Payer: Medicare Other | Attending: Oncology | Admitting: Oncology

## 2017-04-22 ENCOUNTER — Telehealth: Payer: Self-pay | Admitting: Oncology

## 2017-04-22 ENCOUNTER — Inpatient Hospital Stay: Payer: Medicare Other

## 2017-04-22 VITALS — BP 162/63 | HR 54 | Temp 97.9°F | Resp 16 | Ht 72.0 in

## 2017-04-22 DIAGNOSIS — Z192 Hormone resistant malignancy status: Secondary | ICD-10-CM | POA: Insufficient documentation

## 2017-04-22 DIAGNOSIS — Z923 Personal history of irradiation: Secondary | ICD-10-CM | POA: Diagnosis not present

## 2017-04-22 DIAGNOSIS — R338 Other retention of urine: Secondary | ICD-10-CM | POA: Diagnosis not present

## 2017-04-22 DIAGNOSIS — C61 Malignant neoplasm of prostate: Secondary | ICD-10-CM | POA: Diagnosis not present

## 2017-04-22 DIAGNOSIS — Z7901 Long term (current) use of anticoagulants: Secondary | ICD-10-CM | POA: Insufficient documentation

## 2017-04-22 DIAGNOSIS — C7951 Secondary malignant neoplasm of bone: Principal | ICD-10-CM

## 2017-04-22 DIAGNOSIS — Z79899 Other long term (current) drug therapy: Secondary | ICD-10-CM | POA: Insufficient documentation

## 2017-04-22 DIAGNOSIS — R9721 Rising PSA following treatment for malignant neoplasm of prostate: Secondary | ICD-10-CM | POA: Insufficient documentation

## 2017-04-22 LAB — CMP (CANCER CENTER ONLY)
ALBUMIN: 3.2 g/dL — AB (ref 3.5–5.0)
ALK PHOS: 62 U/L (ref 40–150)
ALT: 8 U/L (ref 0–55)
ANION GAP: 11 (ref 3–11)
AST: 10 U/L (ref 5–34)
BILIRUBIN TOTAL: 0.5 mg/dL (ref 0.2–1.2)
BUN: 42 mg/dL — ABNORMAL HIGH (ref 7–26)
CALCIUM: 10.6 mg/dL — AB (ref 8.4–10.4)
CO2: 26 mmol/L (ref 22–29)
Chloride: 105 mmol/L (ref 98–109)
Creatinine: 1.17 mg/dL (ref 0.70–1.30)
GFR, EST NON AFRICAN AMERICAN: 53 mL/min — AB (ref >60)
GFR, Est AFR Am: >60 mL/min (ref >60)
GLUCOSE: 104 mg/dL (ref 70–140)
POTASSIUM: 4.4 mmol/L (ref 3.5–5.1)
Sodium: 142 mmol/L (ref 136–145)
TOTAL PROTEIN: 7.3 g/dL (ref 6.4–8.3)

## 2017-04-22 LAB — CBC WITH DIFFERENTIAL (CANCER CENTER ONLY)
BASOS ABS: 0 10*3/uL (ref 0.0–0.1)
BASOS PCT: 0 %
Eosinophils Absolute: 0.3 10*3/uL (ref 0.0–0.5)
Eosinophils Relative: 4 %
HEMATOCRIT: 42.6 % (ref 38.4–49.9)
HEMOGLOBIN: 13.8 g/dL (ref 13.0–17.1)
LYMPHS PCT: 22 %
Lymphs Abs: 1.6 10*3/uL (ref 0.9–3.3)
MCH: 31.7 pg (ref 27.2–33.4)
MCHC: 32.4 g/dL (ref 32.0–36.0)
MCV: 97.9 fL (ref 79.3–98.0)
MONO ABS: 0.6 10*3/uL (ref 0.1–0.9)
Monocytes Relative: 9 %
NEUTROS ABS: 5 10*3/uL (ref 1.5–6.5)
NEUTROS PCT: 65 %
Platelet Count: 164 10*3/uL (ref 140–400)
RBC: 4.35 MIL/uL (ref 4.20–5.82)
RDW: 14.2 % (ref 11.0–14.6)
WBC: 7.5 10*3/uL (ref 4.0–10.3)

## 2017-04-22 NOTE — Telephone Encounter (Signed)
Appointments scheduled AVS/Calendar printed per 3/1 los °

## 2017-04-22 NOTE — Progress Notes (Signed)
Hematology and Oncology Follow Up Visit  Craig [name] [id number] 12/07/1924 84 y.o. 04/22/2017 11:41 AM [name], [name], DO   Principle Diagnosis: 84 year old man with castration-resistant prostate cancer initially diagnosed in 2011. He subsequently developed advanced disease and currently has with metastatic disease to the bone. He was initially diagnosed in 2011 PSA of 19 and presented with advanced disease.   Prior Therapy: He is status post androgen deprivation with Lupron and Casodex with an excellent response initially and had a PSA nadir down to 1.67. He is status post SRS treatment to the L4 completed on 02/13/2014. He is S/P stereotactic body radiotherapy to the iliac bone treatment completed in 02/2014. He is status post radiation therapy to the right femur completed on 06/07/2014.  Current therapy:  He is on Xtandi started on 11/15/2012. He has been taking 40 mg starting in April 2016 for better tolerance.  He is on Lupron and Xgeva done at [facility].  .  Interim History: Dr. [name] presents today for a follow-up visit with his wife.  Since the last visit, he reports no changes in his health. He continues to take extending without any recent complications. He denied any excessive fatigue, tiredness or lack of appetite. His appetite has improved since last visit and has been eating better. He had any bone pain including back pain, hip pain or rib cage pain. His mobility still limited and requires assistance and most activities of daily living. He is chair bound most of the day.  He does have a urinary catheter in place and developed urinary tract infection and currently completed a course of Keflex.  He does not report any headaches, blurry vision, syncope or seizures. He does not report any fevers, chills or sweats. He does not report any chest pain, palpitation orthopnea. Has not reported any cough or hemoptysis or hematemesis. Does not report  any nausea or vomiting or abdominal pain. Does not report any constipation or diarrhea. Does not report any hematochezia or melena. Does not report any skin rashes or lesions.  He does not report any heat or cold intolerance. Anxiety or depression. Rest of his review of system is negative.  Medications: I have reviewed the patient's current medications.  Current Outpatient Medications  Medication Sig Dispense Refill  . acetaminophen (TYLENOL) 500 MG tablet Take 1,000 mg by mouth daily.     Marland Kitchen amLODipine (NORVASC) 10 MG tablet Take 1 tablet (10 mg total) by mouth daily. 90 tablet 3  . atorvastatin (LIPITOR) 10 MG tablet Take 1 tablet (10 mg total) by mouth daily. 90 tablet 3  . B Complex-C (B-COMPLEX WITH VITAMIN C) tablet Take 1 tablet by mouth daily.      . Denosumab (XGEVA [place]) Inject into the skin every 30 (thirty) days. Monthly. Last inj was 8-19- 2016. Wife not sure what dose it is    . docusate sodium (COLACE) 100 MG capsule Take 100 mg by mouth 2 (two) times daily.    Marland Kitchen ezetimibe (ZETIA) 10 MG tablet Take 1 tablet (10 mg total) by mouth daily. 90 tablet 3  . furosemide (LASIX) 40 MG tablet Take 1 tablet (40 mg total) by mouth daily. 90 tablet 3  . Leuprolide Acetate (LUPRON DEPOT IM) Inject 1 each into the muscle every 6 (six) months. Last inj was in March 2016    . losartan (COZAAR) 100 MG tablet Take 1 tablet (100 mg total) by mouth daily. 90 tablet 3  . multivitamin (THERAGRAN) per  tablet Take 1 tablet by mouth daily. ( with B - Complex )    . Omega-3 Fatty Acids (FISH OIL PO) Take 1 capsule by mouth daily. ( Mega Red )    . oxybutynin (DITROPAN) 5 MG tablet as needed.     . Probiotic Product (PROBIOTIC ADVANCED PO) Take 1 capsule by mouth daily.    . ranitidine (ZANTAC) 150 MG tablet Take 150 mg by mouth at bedtime.    Marland Kitchen warfarin (COUMADIN) 5 MG tablet Take as directed by coumadin clinic 30 tablet 3  . XTANDI 40 MG capsule TAKE 1 CAPSULE BY MOUTH ONCE DAILY 30 capsule 0   No current  facility-administered medications for this visit.      Allergies:  Allergies  Allergen Reactions  . Pravachol Other (See Comments)    Muscle weakness  . Zocor [Simvastatin - High Dose] Other (See Comments)    Muscle weakness    Past Medical History, Surgical history, Social history, and Family History updated and unchanged.   Physical Exam: Blood pressure (!) 162/63, pulse (!) 54, temperature 97.9 F (36.6 C), temperature source Oral, resp. rate 16, height 6' (1.829 m), SpO2 99 %.   ECOG: 2 General appearance:  comfortable-appearing gentleman without distress. Head: Without any abnormalities. Atraumatic. Oropharynx: No oral thrush or ulcers. Eyes: Pupils are equal and round and reactive to light. Lymph nodes: Cervical, supraclavicular, and axillary nodes normal. Heart: No murmurs or gallops. Regular rhythm. Rate controlled. Lung: Clear to auscultation without rhonchi, wheezes or levels to percussion. Abdomin: Soft, nontender without any rebound or guarding. Good bowel sounds. Musculoskeletal: Limited range of motion in hip flexors. Skin: No ecchymosis or petechiae. Neurological examination: No new deficits noted.  Lab Results: Lab Results  Component Value Date   WBC 9.0 03/22/2017   HGB 13.9 03/22/2017   HCT 42.5 03/22/2017   MCV 96.9 03/22/2017   PLT 168 03/22/2017     Chemistry      Component Value Date/Time   NA 141 03/22/2017 1137   NA 139 02/18/2017 1125   K 4.2 03/22/2017 1137   K 4.1 02/18/2017 1125   CL 104 03/22/2017 1137   CO2 27 03/22/2017 1137   CO2 27 02/18/2017 1125   BUN 42 (H) 03/22/2017 1137   BUN 43.8 (H) 02/18/2017 1125   CREATININE 1.24 03/22/2017 1137   CREATININE 1.2 02/18/2017 1125      Component Value Date/Time   CALCIUM 10.7 (H) 03/22/2017 1137   CALCIUM 10.4 02/18/2017 1125   ALKPHOS 60 03/22/2017 1137   ALKPHOS 58 02/18/2017 1125   AST 12 03/22/2017 1137   AST 13 02/18/2017 1125   ALT 8 03/22/2017 1137   ALT 9 02/18/2017  1125   BILITOT 0.5 03/22/2017 1137   BILITOT 0.51 02/18/2017 1125       Results for [name], [name] (MRN [id number]) as of 04/22/2017 11:40  Ref. Range 02/18/2017 11:25 03/22/2017 11:38  Prostate Specific Ag, Serum Latest Ref Range: 0.0 - 4.0 ng/mL 66.5 (H) 69.9 (H)   EXAM: NUCLEAR MEDICINE WHOLE BODY BONE SCAN  TECHNIQUE: Whole body anterior and posterior images were obtained approximately 3 hours after intravenous injection of radiopharmaceutical.  RADIOPHARMACEUTICALS:  20.3 mCi Technetium-28m MDP IV  COMPARISON:  10/05/2011 and 04/13/2016 nuclear medicine exam. 07/11/2014 chest CT. 04/22/2014 lumbar spine MR and pelvic MR  FINDINGS: Areas of radiotracer uptake involving the ribs bilaterally appear relatively similar with the exception of slight increased radiotracer uptake lung the anterior aspect of the left  ninth rib.  Increased uptake involving the left ilium and left ischium similar to prior exam.  Minimal radiotracer uptake lower cervical spine and thoracic spine (near costovertebral junction) similar to prior exams.  Otherwise no new area of radiotracer uptake.  Right kidney visualized. Left kidney poorly delineated as on prior exam.  Some of the CT and MR detected bony metastatic lesions do not demonstrate radiotracer uptake.  IMPRESSION: When compared to the 2 most recent examinations, the only significant change is slight increased uptake along the anterior aspect of the left ninth rib. Remainder of areas of radiotracer uptake otherwise without significant change.     Impression and Plan:  84 year old gentleman with the following issues:   1. Advanced Castration resistant prostate cancer with documented bone metastasis.  He continues to be on a sandy and 40 mg daily and has been able to palliate his disease since 2014.  His PSA has been rising slowly although he has been relatively asymptomatic. Bone scan obtained on 04/18/2017 was  personally reviewed and discussed with the patient and his wife. His disease is predominantly stable and I see no evidence to suggest rapid progression that requires additional treatment. Additional therapy may be needed in the future such as Zytiga, Trudi Ida among other options.   2. Androgen depravation: He continues to receive that under the care of Dr. Junious Silk. This will be continued indefinitely.  3. Bone directed therapy: I recommended continuing Xgeva long term given his risk of developing pathological fractures.  4. Left ninth rib metastasis: He is asymptomatic from this finding radiation therapy can be used in the future if pain becomes an issue.  5. Followup: In 4 weeks after repeating a bone scan.  15  minutes was spent with the patient face-to-face today.  More than 50% of time was dedicated to patient counseling, education and answering questions regarding his diagnosis and future plans of care.   Zola Button, MD 3/1/201911:41 AM

## 2017-04-23 LAB — PROSTATE-SPECIFIC AG, SERUM (LABCORP): PROSTATE SPECIFIC AG, SERUM: 83.4 ng/mL — AB (ref 0.0–4.0)

## 2017-04-25 ENCOUNTER — Telehealth: Payer: Self-pay

## 2017-04-25 NOTE — Telephone Encounter (Signed)
Call returned to pt wife, Craig [name], to inform of increase in PSA to 35.4. Per Dr. Alen Blew, bone scan unchanged, at this point, and no alteration in medications. Pt wife verbalized understanding.

## 2017-04-30 ENCOUNTER — Other Ambulatory Visit: Payer: Self-pay | Admitting: Interventional Cardiology

## 2017-05-02 ENCOUNTER — Ambulatory Visit (INDEPENDENT_AMBULATORY_CARE_PROVIDER_SITE_OTHER): Payer: Medicare Other | Admitting: Cardiology

## 2017-05-02 DIAGNOSIS — Z5181 Encounter for therapeutic drug level monitoring: Secondary | ICD-10-CM | POA: Diagnosis not present

## 2017-05-02 DIAGNOSIS — I482 Chronic atrial fibrillation, unspecified: Secondary | ICD-10-CM

## 2017-05-02 LAB — POCT INR: INR: 2

## 2017-05-02 NOTE — Patient Instructions (Signed)
Description   Spoke with pt and pt's wife and instructed to have pt continue on same dosage 1/2 tablet daily except 1 tablet on Mondays and Thursdays. Recheck in 2 weeks-Self-tester. Call with any concerns new medication or if scheduled for any procedures [phone]

## 2017-05-05 DIAGNOSIS — I480 Paroxysmal atrial fibrillation: Secondary | ICD-10-CM | POA: Diagnosis not present

## 2017-05-13 ENCOUNTER — Other Ambulatory Visit: Payer: Self-pay | Admitting: Oncology

## 2017-05-13 DIAGNOSIS — C7951 Secondary malignant neoplasm of bone: Secondary | ICD-10-CM

## 2017-05-13 DIAGNOSIS — C61 Malignant neoplasm of prostate: Secondary | ICD-10-CM

## 2017-05-16 ENCOUNTER — Ambulatory Visit (INDEPENDENT_AMBULATORY_CARE_PROVIDER_SITE_OTHER): Payer: Medicare Other | Admitting: Cardiology

## 2017-05-16 DIAGNOSIS — I482 Chronic atrial fibrillation, unspecified: Secondary | ICD-10-CM

## 2017-05-16 DIAGNOSIS — Z5181 Encounter for therapeutic drug level monitoring: Secondary | ICD-10-CM

## 2017-05-16 LAB — POCT INR: INR: 2

## 2017-05-16 NOTE — Patient Instructions (Signed)
Description   Spoke with pt and pt's wife and instructed to have pt continue on same dosage 1/2 tablet daily except 1 tablet on Mondays and Thursdays. Recheck in 2 weeks-Self-tester. Call with any concerns new medication or if scheduled for any procedures [phone]

## 2017-05-24 ENCOUNTER — Inpatient Hospital Stay: Payer: Medicare Other | Admitting: Oncology

## 2017-05-24 ENCOUNTER — Telehealth: Payer: Self-pay | Admitting: Oncology

## 2017-05-24 ENCOUNTER — Inpatient Hospital Stay: Payer: Medicare Other | Attending: Oncology

## 2017-05-24 VITALS — BP 142/65 | HR 59 | Resp 17 | Ht 72.0 in

## 2017-05-24 DIAGNOSIS — Z7901 Long term (current) use of anticoagulants: Secondary | ICD-10-CM

## 2017-05-24 DIAGNOSIS — C7951 Secondary malignant neoplasm of bone: Secondary | ICD-10-CM | POA: Insufficient documentation

## 2017-05-24 DIAGNOSIS — C61 Malignant neoplasm of prostate: Secondary | ICD-10-CM | POA: Diagnosis not present

## 2017-05-24 DIAGNOSIS — Z923 Personal history of irradiation: Secondary | ICD-10-CM | POA: Diagnosis not present

## 2017-05-24 DIAGNOSIS — Z79899 Other long term (current) drug therapy: Secondary | ICD-10-CM | POA: Insufficient documentation

## 2017-05-24 DIAGNOSIS — Z192 Hormone resistant malignancy status: Secondary | ICD-10-CM | POA: Diagnosis not present

## 2017-05-24 DIAGNOSIS — R5383 Other fatigue: Secondary | ICD-10-CM

## 2017-05-24 DIAGNOSIS — R338 Other retention of urine: Secondary | ICD-10-CM | POA: Diagnosis not present

## 2017-05-24 LAB — CBC WITH DIFFERENTIAL (CANCER CENTER ONLY)
Basophils Absolute: 0 10*3/uL (ref 0.0–0.1)
Basophils Relative: 1 %
EOS ABS: 0.2 10*3/uL (ref 0.0–0.5)
Eosinophils Relative: 3 %
HCT: 40.8 % (ref 38.4–49.9)
HEMOGLOBIN: 13.2 g/dL (ref 13.0–17.1)
LYMPHS ABS: 1.6 10*3/uL (ref 0.9–3.3)
LYMPHS PCT: 20 %
MCH: 31 pg (ref 27.2–33.4)
MCHC: 32.5 g/dL (ref 32.0–36.0)
MCV: 95.7 fL (ref 79.3–98.0)
Monocytes Absolute: 0.8 10*3/uL (ref 0.1–0.9)
Monocytes Relative: 10 %
NEUTROS PCT: 66 %
Neutro Abs: 5.4 10*3/uL (ref 1.5–6.5)
Platelet Count: 168 10*3/uL (ref 140–400)
RBC: 4.26 MIL/uL (ref 4.20–5.82)
RDW: 15.3 % — ABNORMAL HIGH (ref 11.0–14.6)
WBC Count: 8 10*3/uL (ref 4.0–10.3)

## 2017-05-24 LAB — CMP (CANCER CENTER ONLY)
ALK PHOS: 63 U/L (ref 40–150)
ALT: 9 U/L (ref 0–55)
ANION GAP: 8 (ref 3–11)
AST: 12 U/L (ref 5–34)
Albumin: 3.2 g/dL — ABNORMAL LOW (ref 3.5–5.0)
BILIRUBIN TOTAL: 0.5 mg/dL (ref 0.2–1.2)
BUN: 44 mg/dL — ABNORMAL HIGH (ref 7–26)
CALCIUM: 10.4 mg/dL (ref 8.4–10.4)
CO2: 27 mmol/L (ref 22–29)
CREATININE: 1.41 mg/dL — AB (ref 0.70–1.30)
Chloride: 105 mmol/L (ref 98–109)
GFR, Est AFR Am: 49 mL/min — ABNORMAL LOW (ref >60)
GFR, Estimated: 42 mL/min — ABNORMAL LOW (ref >60)
Glucose, Bld: 103 mg/dL (ref 70–140)
Potassium: 4 mmol/L (ref 3.5–5.1)
SODIUM: 140 mmol/L (ref 136–145)
TOTAL PROTEIN: 7 g/dL (ref 6.4–8.3)

## 2017-05-24 NOTE — Progress Notes (Signed)
Hematology and Oncology Follow Up Visit  Craig [name] [id number] 1924/05/30 84 y.o. 05/24/2017 12:19 PM Craig [name], [name], DO   Principle Diagnosis: 84 year old man with advanced castration-resistant prostate cancer with disease in the bone.  He was initially diagnosed in 2011.  Prior Therapy: He is status post androgen deprivation with Lupron and Casodex with an excellent response initially and had a PSA nadir down to 1.67. He is status post SRS treatment to the L4 completed on 02/13/2014. He is S/P stereotactic body radiotherapy to the iliac bone treatment completed in 02/2014. He is status post radiation therapy to the right femur completed on 06/07/2014.  Current therapy:  He is on Xtandi started on 11/15/2012. He has been taking 40 mg since April 2016 as he cannot tolerate the higher dose.  He is on Lupron and Xgeva done at [facility].  .  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  He reports no major changes in his health since the last visit.  His wife does report that he is slightly more fatigued and less mobile.  He denies any recent hospitalization or illnesses.  He denies any recent infection or pneumonia.  He continues to take Xtandi at 40 mg daily.  He denies any new complications related to this current dose.  He does have mild fatigue which is chronic in nature.  He denies any bone pain or pathological fractures.  His appetite remained marginal and his weight is the same.  He does not report any headaches, blurry vision, syncope or seizures. He does not report any fevers, chills or sweats. He does not report any chest pain, palpitation orthopnea. Has not reported any cough or hemoptysis or hematemesis. Does not report any nausea or vomiting or abdominal pain. Does not report any hematochezia or melena. Does not report any skin rashes or lesions.  He denies any mood disorder.  Rest of his review of system is  negative.  Medications: I have reviewed the patient's current medications.  Current Outpatient Medications  Medication Sig Dispense Refill  . acetaminophen (TYLENOL) 500 MG tablet Take 1,000 mg by mouth daily.     Marland Kitchen amLODipine (NORVASC) 10 MG tablet Take 1 tablet (10 mg total) by mouth daily. 90 tablet 3  . atorvastatin (LIPITOR) 10 MG tablet Take 1 tablet (10 mg total) by mouth daily. 90 tablet 3  . B Complex-C (B-COMPLEX WITH VITAMIN C) tablet Take 1 tablet by mouth daily.      . Denosumab (XGEVA [place]) Inject into the skin every 30 (thirty) days. Monthly. Last inj was 8-19- 2016. Wife not sure what dose it is    . docusate sodium (COLACE) 100 MG capsule Take 100 mg by mouth 2 (two) times daily.    Marland Kitchen ezetimibe (ZETIA) 10 MG tablet Take 1 tablet (10 mg total) by mouth daily. 90 tablet 3  . furosemide (LASIX) 40 MG tablet Take 1 tablet (40 mg total) by mouth daily. 90 tablet 3  . Leuprolide Acetate (LUPRON DEPOT IM) Inject 1 each into the muscle every 6 (six) months. Last inj was in March 2016    . losartan (COZAAR) 100 MG tablet Take 1 tablet (100 mg total) by mouth daily. 90 tablet 3  . multivitamin (THERAGRAN) per tablet Take 1 tablet by mouth daily. ( with B - Complex )    . Omega-3 Fatty Acids (FISH OIL PO) Take 1 capsule by mouth daily. ( Mega Red )    .  oxybutynin (DITROPAN) 5 MG tablet as needed.     . Probiotic Product (PROBIOTIC ADVANCED PO) Take 1 capsule by mouth daily.    . ranitidine (ZANTAC) 150 MG tablet Take 150 mg by mouth at bedtime.    Marland Kitchen warfarin (COUMADIN) 5 MG tablet TAKE 1 TABLET BY MOUTH AS DIRECTED BY THE COUMADIN CLINIC FOR 30 DAYS 30 tablet 4  . XTANDI 40 MG capsule TAKE 1 CAPSULE BY MOUTH ONCE DAILY 30 capsule 0   No current facility-administered medications for this visit.      Allergies:  Allergies  Allergen Reactions  . Pravachol Other (See Comments)    Muscle weakness  . Zocor [Simvastatin - High Dose] Other (See Comments)    Muscle weakness    Past  Medical History, Surgical history, Social history, and Family History updated and unchanged.   Physical Exam: Blood pressure (!) 142/65, pulse (!) 59, resp. rate 17, height 6' (1.829 m), SpO2 95 %.   ECOG: 2 General appearance: Alert, comfortable gentleman without distress in a wheelchair. Head: Normocephalic without abnormalities. Oropharynx: Mucous membranes are moist and pink. Eyes: Sclera anicteric. Lymph nodes: No lymphadenopathy palpated in the cervical, axillary and supraclavicular regions. Heart: Regular rhythm without murmurs. Lung: Clear without wheezes or dullness to percussion. Abdomin: Soft, nondistended with good bowel sounds.  No rebound or guarding. Musculoskeletal: No joint deformity or effusion. Skin: No petechiae or rash. Neurological examination: No focal deficits noted.  Lab Results: Lab Results  Component Value Date   WBC 8.0 05/24/2017   HGB 13.9 03/22/2017   HCT 40.8 05/24/2017   MCV 95.7 05/24/2017   PLT 168 05/24/2017     Chemistry      Component Value Date/Time   NA 142 04/22/2017 1119   NA 139 02/18/2017 1125   K 4.4 04/22/2017 1119   K 4.1 02/18/2017 1125   CL 105 04/22/2017 1119   CO2 26 04/22/2017 1119   CO2 27 02/18/2017 1125   BUN 42 (H) 04/22/2017 1119   BUN 43.8 (H) 02/18/2017 1125   CREATININE 1.17 04/22/2017 1119   CREATININE 1.2 02/18/2017 1125      Component Value Date/Time   CALCIUM 10.6 (H) 04/22/2017 1119   CALCIUM 10.4 02/18/2017 1125   ALKPHOS 62 04/22/2017 1119   ALKPHOS 58 02/18/2017 1125   AST 10 04/22/2017 1119   AST 13 02/18/2017 1125   ALT 8 04/22/2017 1119   ALT 9 02/18/2017 1125   BILITOT 0.5 04/22/2017 1119   BILITOT 0.51 02/18/2017 1125         Impression and Plan:  84 year old gentleman with the following issues:   1. Castration resistant prostate cancer with his initial diagnosis in 2011.  He has metastatic disease to the bone without visceral metastasis.   He is currently on Xtandi at 40 mg  daily that has palliated his symptoms adequately since 2014. His PSA continues to rise  Although he is no symptoms.  His bone scan was reviewed again and discussed with the patient and his family and showed no new areas of metastasis.  Risks and benefits of continuing Xtandi for the time being versus switching to Fabio Asa was reviewed today.  Complication associated with Zytiga were discussed today in detail which include nausea, fatigue, edema and hypertension.  After discussion today, we have elected to continue with Xtandi for the time being and switch to [facility] in the future if he develops symptomatic progression.  2. Androgen depravation: I recommended continuing this indefinitely under the care  of Dr. Junious Silk.  3. Bone directed therapy: He is currently on Xgeva without new complications.  He continues to receive this under the care of Dr. Junious Silk and I recommended continuing for the time being.  4. Left ninth rib metastasis: Asymptomatic at this time.  This could be related to trauma rather than malignancy.  5. Followup: In 4 weeks sooner if needed.  25  minutes was spent with the patient face-to-face today.  More than 50% of time was dedicated to patient counseling, education and discussing alternative treatment options and answering question regarding plan of care.   Zola Button, MD 4/2/201912:19 PM

## 2017-05-24 NOTE — Telephone Encounter (Signed)
Scheduled appt per 4/2 los - Gave patient AVS and calender per los,.

## 2017-05-25 ENCOUNTER — Telehealth: Payer: Self-pay | Admitting: *Deleted

## 2017-05-25 LAB — PROSTATE-SPECIFIC AG, SERUM (LABCORP): PROSTATE SPECIFIC AG, SERUM: 106.2 ng/mL — AB (ref 0.0–4.0)

## 2017-05-25 NOTE — Telephone Encounter (Signed)
-----   Message from Wyatt Portela, MD sent at 05/25/2017  9:26 AM EDT ----- Please let him know his PSA is up. No change for now.

## 2017-05-25 NOTE — Telephone Encounter (Signed)
As noted below by Dr. Alen Blew, I informed wife of PSA level. She verbalized understanding.

## 2017-05-30 ENCOUNTER — Ambulatory Visit (INDEPENDENT_AMBULATORY_CARE_PROVIDER_SITE_OTHER): Payer: Medicare Other | Admitting: Internal Medicine

## 2017-05-30 DIAGNOSIS — Z5181 Encounter for therapeutic drug level monitoring: Secondary | ICD-10-CM | POA: Diagnosis not present

## 2017-05-30 DIAGNOSIS — I482 Chronic atrial fibrillation, unspecified: Secondary | ICD-10-CM

## 2017-05-30 DIAGNOSIS — Z8673 Personal history of transient ischemic attack (TIA), and cerebral infarction without residual deficits: Secondary | ICD-10-CM | POA: Diagnosis not present

## 2017-05-30 LAB — POCT INR: INR: 1.9

## 2017-05-30 NOTE — Patient Instructions (Signed)
Description   Spoke with  pt's wife and instructed to have pt continue on same dosage 1/2 tablet daily except 1 tablet on Mondays and Thursdays. Recheck in 2 weeks-Self-tester. Call with any concerns new medication or if scheduled for any procedures [phone]

## 2017-06-13 ENCOUNTER — Ambulatory Visit (INDEPENDENT_AMBULATORY_CARE_PROVIDER_SITE_OTHER): Payer: Medicare Other | Admitting: Cardiovascular Disease

## 2017-06-13 DIAGNOSIS — I482 Chronic atrial fibrillation, unspecified: Secondary | ICD-10-CM

## 2017-06-13 DIAGNOSIS — Z5181 Encounter for therapeutic drug level monitoring: Secondary | ICD-10-CM | POA: Diagnosis not present

## 2017-06-13 LAB — POCT INR: INR: 1.9

## 2017-06-14 ENCOUNTER — Other Ambulatory Visit: Payer: Self-pay | Admitting: Oncology

## 2017-06-14 DIAGNOSIS — L570 Actinic keratosis: Secondary | ICD-10-CM | POA: Diagnosis not present

## 2017-06-14 DIAGNOSIS — L738 Other specified follicular disorders: Secondary | ICD-10-CM | POA: Diagnosis not present

## 2017-06-14 DIAGNOSIS — C7951 Secondary malignant neoplasm of bone: Secondary | ICD-10-CM

## 2017-06-14 DIAGNOSIS — C61 Malignant neoplasm of prostate: Secondary | ICD-10-CM

## 2017-06-14 DIAGNOSIS — Z85828 Personal history of other malignant neoplasm of skin: Secondary | ICD-10-CM | POA: Diagnosis not present

## 2017-06-14 DIAGNOSIS — Z85820 Personal history of malignant melanoma of skin: Secondary | ICD-10-CM | POA: Diagnosis not present

## 2017-06-14 DIAGNOSIS — D1801 Hemangioma of skin and subcutaneous tissue: Secondary | ICD-10-CM | POA: Diagnosis not present

## 2017-06-24 ENCOUNTER — Other Ambulatory Visit: Payer: Self-pay | Admitting: Pharmacist

## 2017-06-24 ENCOUNTER — Inpatient Hospital Stay: Payer: Medicare Other | Attending: Oncology | Admitting: Oncology

## 2017-06-24 ENCOUNTER — Inpatient Hospital Stay: Payer: Medicare Other

## 2017-06-24 ENCOUNTER — Telehealth: Payer: Self-pay

## 2017-06-24 VITALS — BP 145/73 | HR 60 | Temp 97.7°F | Resp 18 | Ht 72.0 in

## 2017-06-24 DIAGNOSIS — Z192 Hormone resistant malignancy status: Secondary | ICD-10-CM | POA: Diagnosis not present

## 2017-06-24 DIAGNOSIS — Z79899 Other long term (current) drug therapy: Secondary | ICD-10-CM | POA: Diagnosis not present

## 2017-06-24 DIAGNOSIS — Z923 Personal history of irradiation: Secondary | ICD-10-CM | POA: Insufficient documentation

## 2017-06-24 DIAGNOSIS — C7951 Secondary malignant neoplasm of bone: Principal | ICD-10-CM

## 2017-06-24 DIAGNOSIS — C61 Malignant neoplasm of prostate: Secondary | ICD-10-CM

## 2017-06-24 DIAGNOSIS — Z7901 Long term (current) use of anticoagulants: Secondary | ICD-10-CM | POA: Diagnosis not present

## 2017-06-24 LAB — CMP (CANCER CENTER ONLY)
ALT: 7 U/L (ref 0–55)
ANION GAP: 6 (ref 3–11)
AST: 13 U/L (ref 5–34)
Albumin: 3.3 g/dL — ABNORMAL LOW (ref 3.5–5.0)
Alkaline Phosphatase: 65 U/L (ref 40–150)
BILIRUBIN TOTAL: 0.5 mg/dL (ref 0.2–1.2)
BUN: 40 mg/dL — AB (ref 7–26)
CO2: 28 mmol/L (ref 22–29)
Calcium: 10.5 mg/dL — ABNORMAL HIGH (ref 8.4–10.4)
Chloride: 105 mmol/L (ref 98–109)
Creatinine: 1.12 mg/dL (ref 0.70–1.30)
GFR, EST NON AFRICAN AMERICAN: 56 mL/min — AB (ref >60)
Glucose, Bld: 132 mg/dL (ref 70–140)
POTASSIUM: 4.3 mmol/L (ref 3.5–5.1)
Sodium: 139 mmol/L (ref 136–145)
TOTAL PROTEIN: 7 g/dL (ref 6.4–8.3)

## 2017-06-24 LAB — CBC WITH DIFFERENTIAL (CANCER CENTER ONLY)
BASOS ABS: 0 10*3/uL (ref 0.0–0.1)
Basophils Relative: 0 %
EOS PCT: 3 %
Eosinophils Absolute: 0.2 10*3/uL (ref 0.0–0.5)
HCT: 42.1 % (ref 38.4–49.9)
HEMOGLOBIN: 13.8 g/dL (ref 13.0–17.1)
LYMPHS PCT: 20 %
Lymphs Abs: 1.6 10*3/uL (ref 0.9–3.3)
MCH: 31.2 pg (ref 27.2–33.4)
MCHC: 32.7 g/dL (ref 32.0–36.0)
MCV: 95.3 fL (ref 79.3–98.0)
Monocytes Absolute: 0.8 10*3/uL (ref 0.1–0.9)
Monocytes Relative: 10 %
NEUTROS PCT: 67 %
Neutro Abs: 5.3 10*3/uL (ref 1.5–6.5)
PLATELETS: 154 10*3/uL (ref 140–400)
RBC: 4.41 MIL/uL (ref 4.20–5.82)
RDW: 15.1 % — ABNORMAL HIGH (ref 11.0–14.6)
WBC: 7.9 10*3/uL (ref 4.0–10.3)

## 2017-06-24 NOTE — Telephone Encounter (Signed)
Printed avs and calender of upcoming appointment. Per 5/3 los 

## 2017-06-24 NOTE — Progress Notes (Signed)
Hematology and Oncology Follow Up Visit  Craig [name] [id number] 29-Jun-1924 84 y.o. 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  He was initially diagnosed in 2011.  Prior Therapy: He is status post androgen deprivation with Lupron and Casodex with an excellent response initially and had a PSA nadir down to 1.67. He is status post SRS treatment to the L4 completed on 02/13/2014. He is S/P stereotactic body radiotherapy to the iliac bone treatment completed in 02/2014. He is status post radiation therapy to the right femur completed on 06/07/2014.  Current therapy:  He is on Xtandi started on 11/15/2012. He has been taking 40 mg since April 2016 as he cannot tolerate the higher dose.  He is on Lupron and Xgeva done at [facility].  .  Interim History: Craig [name] returns today for a follow-up.  He reports no major changes in his health since the last visit.  He reports limited mobility and requires a lot of assistance from his wife with standing and pivoting.  He denies any bone pain or pathological fractures.  He denies any complications related to [facility].  He continues to have a Foley catheter which is replaced on a monthly basis.  He does not report any headaches, blurry vision, syncope or seizures. He does not report any fevers, chills or sweats. He does not report any chest pain, palpitation orthopnea. Has not reported any cough, wheezing or hemoptysis.  Does not report any nausea or vomiting or abdominal pain. Does not report any hematochezia or melena. Does not report any skin rashes or lesions.  Denies any anxiety or depression.  Rest of his review of system is negative.  Medications: I have reviewed the patient's current medications.  Current Outpatient Medications  Medication Sig Dispense Refill  . acetaminophen (TYLENOL) 500 MG  tablet Take 1,000 mg by mouth daily.     Marland Kitchen amLODipine (NORVASC) 10 MG tablet Take 1 tablet (10 mg total) by mouth daily. 90 tablet 3  . atorvastatin (LIPITOR) 10 MG tablet Take 1 tablet (10 mg total) by mouth daily. 90 tablet 3  . B Complex-C (B-COMPLEX WITH VITAMIN C) tablet Take 1 tablet by mouth daily.      . Denosumab (XGEVA Bessie) Inject into the skin every 30 (thirty) days. Monthly. Last inj was 8-19- 2016. Wife not sure what dose it is    . docusate sodium (COLACE) 100 MG capsule Take 100 mg by mouth 2 (two) times daily.    Marland Kitchen ezetimibe (ZETIA) 10 MG tablet Take 1 tablet (10 mg total) by mouth daily. 90 tablet 3  . furosemide (LASIX) 40 MG tablet Take 1 tablet (40 mg total) by mouth daily. 90 tablet 3  . Leuprolide Acetate (LUPRON DEPOT IM) Inject 1 each into the muscle every 6 (six) months. Last inj was in March 2016    . losartan (COZAAR) 100 MG tablet Take 1 tablet (100 mg total) by mouth daily. 90 tablet 3  . multivitamin (THERAGRAN) per tablet Take 1 tablet by mouth daily. ( with B - Complex )    . Omega-3 Fatty Acids (FISH OIL PO) Take 1 capsule by mouth daily. ( Mega Red )    . oxybutynin (DITROPAN) 5 MG tablet as needed.     . Probiotic Product (PROBIOTIC ADVANCED PO) Take 1 capsule by mouth daily.    . ranitidine (ZANTAC) 150 MG  tablet Take 150 mg by mouth at bedtime.    Marland Kitchen warfarin (COUMADIN) 5 MG tablet TAKE 1 TABLET BY MOUTH AS DIRECTED BY THE COUMADIN CLINIC FOR 30 DAYS 30 tablet 4  . XTANDI 40 MG capsule TAKE 1 CAPSULE (40 MG) BY MOUTH ONCE DAILY 30 capsule 0   No current facility-administered medications for this visit.      Allergies:  Allergies  Allergen Reactions  . Pravachol Other (See Comments)    Muscle weakness  . Zocor [Simvastatin - High Dose] Other (See Comments)    Muscle weakness    Past Medical History, Surgical history, Social history, and Family History updated and unchanged.   Physical Exam: Blood pressure (!) 145/73, pulse 60, temperature 97.7 F  (36.5 C), temperature source Oral, resp. rate 18, height 6' (1.829 m), SpO2 98 %.   ECOG: 2 General appearance: Comfortable appearing gentleman without distress. Head: Atraumatic without abnormalities. Oropharynx: No thrush or ulcers. Eyes: Pupils are equal and round reactive to light.  Lymph nodes: cervical, axillary and supraclavicular lymph nodes not enlarged. Heart: Regular rate without any murmurs or gallops Lung: Clear to auscultation without any rhonchi, wheezes or dullness to percussion. Abdomin: Soft, nontender without any rebound or guarding. Musculoskeletal: No clubbing or cyanosis. Skin: No ecchymosis or petechiae. Neurological examination: No motor or sensory deficits.  Lab Results: Lab Results  Component Value Date   WBC 7.9 06/24/2017   HGB 13.8 06/24/2017   HCT 42.1 06/24/2017   MCV 95.3 06/24/2017   PLT 154 06/24/2017     Chemistry      Component Value Date/Time   NA 139 06/24/2017 1130   NA 139 02/18/2017 1125   K 4.3 06/24/2017 1130   K 4.1 02/18/2017 1125   CL 105 06/24/2017 1130   CO2 28 06/24/2017 1130   CO2 27 02/18/2017 1125   BUN 40 (H) 06/24/2017 1130   BUN 43.8 (H) 02/18/2017 1125   CREATININE 1.12 06/24/2017 1130   CREATININE 1.2 02/18/2017 1125      Component Value Date/Time   CALCIUM 10.5 (H) 06/24/2017 1130   CALCIUM 10.4 02/18/2017 1125   ALKPHOS 65 06/24/2017 1130   ALKPHOS 58 02/18/2017 1125   AST 13 06/24/2017 1130   AST 13 02/18/2017 1125   ALT 7 06/24/2017 1130   ALT 9 02/18/2017 1125   BILITOT 0.5 06/24/2017 1130   BILITOT 0.51 02/18/2017 1125       Results for Craig [name], Craig [name] (MRN [id number]) as of 06/24/2017 12:28  Ref. Range 03/22/2017 11:38 04/22/2017 11:19 05/24/2017 11:47  Prostate Specific Ag, Serum Latest Ref Range: 0.0 - 4.0 ng/mL 69.9 (H) 83.4 (H) 106.2 (H)    Impression and Plan:  84 year old gentleman with the following issues:   1. Castration-resistant prostate cancer with disease to the bone documented  in 2014.    He is currently on Xtandi at 40 mg daily without any new complications.  His PSA continues to rise although he is asymptomatic from his disease.  Alternative treatment options were reviewed again today including a Zytiga or systemic chemotherapy.  He is a marginal candidate for any aggressive therapy given his overall poor performance status.  After discussion today, we have opted to continue with Xtandi given the fact that he is asymptomatic and would like to avoid any treatments that could bring on more side effects.  We will continue to monitor him closely and consider Zytiga trial in the future.  2. Androgen depravation: He continues to receive that  under the care of Dr. Junious Silk.  3. Bone directed therapy: No issues related to [facility] which he receives on a monthly basis under the care of Dr. Junious Silk.  4. Followup: In 4 weeks sooner if needed.  15  minutes was spent with the patient face-to-face today.  More than 50% of time was dedicated to patient counseling, education and coordinating his future plan of care.  Zola Button, MD 5/3/201912:28 PM

## 2017-06-25 LAB — PROSTATE-SPECIFIC AG, SERUM (LABCORP): PROSTATE SPECIFIC AG, SERUM: 109.8 ng/mL — AB (ref 0.0–4.0)

## 2017-06-27 ENCOUNTER — Telehealth: Payer: Self-pay

## 2017-06-27 ENCOUNTER — Ambulatory Visit (INDEPENDENT_AMBULATORY_CARE_PROVIDER_SITE_OTHER): Payer: Medicare Other | Admitting: Internal Medicine

## 2017-06-27 ENCOUNTER — Telehealth: Payer: Self-pay | Admitting: *Deleted

## 2017-06-27 DIAGNOSIS — Z5181 Encounter for therapeutic drug level monitoring: Secondary | ICD-10-CM

## 2017-06-27 DIAGNOSIS — I482 Chronic atrial fibrillation, unspecified: Secondary | ICD-10-CM

## 2017-06-27 DIAGNOSIS — Z8673 Personal history of transient ischemic attack (TIA), and cerebral infarction without residual deficits: Secondary | ICD-10-CM | POA: Diagnosis not present

## 2017-06-27 LAB — POCT INR: INR: 1.8

## 2017-06-27 NOTE — Telephone Encounter (Signed)
Called patient and left a message with HHA for the wife. Appointment was changed and scheduled for 12:30 on 6/7. Per 5/6 in basket request

## 2017-06-27 NOTE — Progress Notes (Signed)
Spoke with pt's spouse who states that he sees his urologist and oncologist every month and cards every 6 months so he has not had a need to see PCP since 2017.  Advised pt's spouse that Dr. Raliegh Scarlet needs to see the patient, at the very least once yearly for CPE/[place] exams.  Pt's spouse stated that she will call back to schedule an appointment for pt to be seen in the fall when he can get his flu shot at the same time.  Charyl Bigger, CMA

## 2017-06-27 NOTE — Telephone Encounter (Signed)
-----   Message from Wyatt Portela, MD sent at 06/27/2017  8:29 AM EDT ----- Please let the patient (or his wife) know his PSA is slightly up. No changes in his treatment.

## 2017-06-27 NOTE — Telephone Encounter (Signed)
Informed pt's wife PSA slightly elevated but stable, no change in treatment.  Wife verbalized understanding.  Wife asked to change next appointments to 6/7 from 6/6 due to conflicts with urology appointments. Will f/u with Dr. Alen Blew and send scheduling message as needed.

## 2017-06-27 NOTE — Patient Instructions (Signed)
Description   Spoke with pt's wife and instructed to have pt continue on same dosage 1/2 tablet daily except 1 tablet on Mondays and Thursdays. Recheck in 2 weeks-Self-tester. Call with any concerns new medication or if scheduled for any procedures [phone]

## 2017-06-29 DIAGNOSIS — I480 Paroxysmal atrial fibrillation: Secondary | ICD-10-CM | POA: Diagnosis not present

## 2017-07-11 ENCOUNTER — Ambulatory Visit (INDEPENDENT_AMBULATORY_CARE_PROVIDER_SITE_OTHER): Payer: Medicare Other | Admitting: Cardiology

## 2017-07-11 DIAGNOSIS — Z5181 Encounter for therapeutic drug level monitoring: Secondary | ICD-10-CM | POA: Diagnosis not present

## 2017-07-11 DIAGNOSIS — I482 Chronic atrial fibrillation, unspecified: Secondary | ICD-10-CM

## 2017-07-11 DIAGNOSIS — Z8673 Personal history of transient ischemic attack (TIA), and cerebral infarction without residual deficits: Secondary | ICD-10-CM | POA: Diagnosis not present

## 2017-07-11 LAB — POCT INR: INR: 1.8

## 2017-07-11 NOTE — Patient Instructions (Signed)
Description   Spoke with pt's wife and instructed to have pt continue on same dosage 1/2 tablet daily except 1 tablet on Mondays and Thursdays. Recheck in 2 weeks-Self-tester. Call with any concerns new medication or if scheduled for any procedures [phone]

## 2017-07-13 ENCOUNTER — Other Ambulatory Visit: Payer: Self-pay | Admitting: Interventional Cardiology

## 2017-07-15 ENCOUNTER — Other Ambulatory Visit: Payer: Self-pay | Admitting: Oncology

## 2017-07-15 DIAGNOSIS — C7951 Secondary malignant neoplasm of bone: Secondary | ICD-10-CM

## 2017-07-15 DIAGNOSIS — C61 Malignant neoplasm of prostate: Secondary | ICD-10-CM

## 2017-07-25 ENCOUNTER — Ambulatory Visit (INDEPENDENT_AMBULATORY_CARE_PROVIDER_SITE_OTHER): Payer: Medicare Other | Admitting: Cardiology

## 2017-07-25 DIAGNOSIS — Z5181 Encounter for therapeutic drug level monitoring: Secondary | ICD-10-CM

## 2017-07-25 DIAGNOSIS — I482 Chronic atrial fibrillation, unspecified: Secondary | ICD-10-CM

## 2017-07-25 LAB — POCT INR: INR: 1.6 — AB (ref 2.0–3.0)

## 2017-07-27 ENCOUNTER — Other Ambulatory Visit: Payer: Self-pay | Admitting: Interventional Cardiology

## 2017-07-28 ENCOUNTER — Ambulatory Visit: Payer: Medicare Other | Admitting: Oncology

## 2017-07-28 ENCOUNTER — Other Ambulatory Visit: Payer: Medicare Other

## 2017-07-29 ENCOUNTER — Inpatient Hospital Stay: Payer: Medicare Other | Admitting: Oncology

## 2017-07-29 ENCOUNTER — Inpatient Hospital Stay: Payer: Medicare Other | Attending: Oncology

## 2017-07-29 ENCOUNTER — Telehealth: Payer: Self-pay | Admitting: Oncology

## 2017-07-29 VITALS — BP 122/70 | HR 75 | Temp 97.7°F | Resp 17 | Ht 72.0 in

## 2017-07-29 DIAGNOSIS — C7951 Secondary malignant neoplasm of bone: Secondary | ICD-10-CM | POA: Diagnosis not present

## 2017-07-29 DIAGNOSIS — Z7901 Long term (current) use of anticoagulants: Secondary | ICD-10-CM | POA: Insufficient documentation

## 2017-07-29 DIAGNOSIS — Z79899 Other long term (current) drug therapy: Secondary | ICD-10-CM

## 2017-07-29 DIAGNOSIS — Z5111 Encounter for antineoplastic chemotherapy: Secondary | ICD-10-CM | POA: Diagnosis not present

## 2017-07-29 DIAGNOSIS — C61 Malignant neoplasm of prostate: Secondary | ICD-10-CM

## 2017-07-29 DIAGNOSIS — I480 Paroxysmal atrial fibrillation: Secondary | ICD-10-CM | POA: Diagnosis not present

## 2017-07-29 DIAGNOSIS — Z923 Personal history of irradiation: Secondary | ICD-10-CM

## 2017-07-29 DIAGNOSIS — R339 Retention of urine, unspecified: Secondary | ICD-10-CM | POA: Diagnosis not present

## 2017-07-29 LAB — CBC WITH DIFFERENTIAL (CANCER CENTER ONLY)
BASOS ABS: 0 10*3/uL (ref 0.0–0.1)
BASOS PCT: 0 %
EOS ABS: 0.2 10*3/uL (ref 0.0–0.5)
EOS PCT: 3 %
HCT: 41.3 % (ref 38.4–49.9)
HEMOGLOBIN: 13.6 g/dL (ref 13.0–17.1)
Lymphocytes Relative: 21 %
Lymphs Abs: 1.8 10*3/uL (ref 0.9–3.3)
MCH: 31.3 pg (ref 27.2–33.4)
MCHC: 32.9 g/dL (ref 32.0–36.0)
MCV: 95.1 fL (ref 79.3–98.0)
Monocytes Absolute: 0.8 10*3/uL (ref 0.1–0.9)
Monocytes Relative: 10 %
NEUTROS PCT: 66 %
Neutro Abs: 5.7 10*3/uL (ref 1.5–6.5)
PLATELETS: 167 10*3/uL (ref 140–400)
RBC: 4.34 MIL/uL (ref 4.20–5.82)
RDW: 15.2 % — ABNORMAL HIGH (ref 11.0–14.6)
WBC: 8.5 10*3/uL (ref 4.0–10.3)

## 2017-07-29 LAB — CMP (CANCER CENTER ONLY)
ALBUMIN: 3.3 g/dL — AB (ref 3.5–5.0)
ALK PHOS: 67 U/L (ref 40–150)
ALT: 6 U/L (ref 0–55)
AST: 13 U/L (ref 5–34)
Anion gap: 9 (ref 3–11)
BUN: 42 mg/dL — ABNORMAL HIGH (ref 7–26)
CALCIUM: 11 mg/dL — AB (ref 8.4–10.4)
CHLORIDE: 101 mmol/L (ref 98–109)
CO2: 29 mmol/L (ref 22–29)
CREATININE: 1.22 mg/dL (ref 0.70–1.30)
GFR, Est AFR Am: 58 mL/min — ABNORMAL LOW (ref >60)
GFR, Estimated: 50 mL/min — ABNORMAL LOW (ref >60)
GLUCOSE: 124 mg/dL (ref 70–140)
Potassium: 4.6 mmol/L (ref 3.5–5.1)
SODIUM: 139 mmol/L (ref 136–145)
Total Bilirubin: 0.5 mg/dL (ref 0.2–1.2)
Total Protein: 7.3 g/dL (ref 6.4–8.3)

## 2017-07-29 NOTE — Telephone Encounter (Signed)
Appointments scheduled AVS/Calendar printed per 6/7 los

## 2017-07-29 NOTE — Progress Notes (Signed)
Hematology and Oncology Follow Up Visit  Craig [name] [id number] April 22, 1924 84 y.o. 07/29/2017 1:24 PM [name], [name], DO   Principle Diagnosis: 84 year old man with castration-resistant prostate cancer documented in 2014 with disease to the bone.   Prior Therapy: He is status post androgen deprivation with Lupron and Casodex with an excellent response initially and had a PSA nadir down to 1.67. He is status post SRS treatment to the L4 completed on 02/13/2014. He is S/P stereotactic body radiotherapy to the iliac bone treatment completed in 02/2014. He is status post radiation therapy to the right femur completed on 06/07/2014.  Current therapy:  He is on Xtandi started on 11/15/2012.  He remains on 40 mg daily for better tolerance.  He is on Lupron and Xgeva done at [facility].  .  Interim History: Craig [name] returns today for a follow-up.  He does not report any changes in his health or recent illnesses.  He continues to be wheelchair-bound for the most part and is able to stand and pivot for short distances.  His appetite is slightly declined and his weight slightly down.  He does not report any new pain or discomfort.  His wife gives him Tylenol once a day and appears to be adequate to control his pain.  He denies any complications related to [facility].    He does not report any headaches, blurry vision, syncope or seizures.  He denies any alteration of mental status or confusion.  He does not report any fevers, chills or sweats. He does not report any chest pain, palpitation orthopnea. Has not reported any cough, wheezing or hemoptysis.  Does not report any nausea or vomiting or abdominal pain. Does not report any hematochezia or melena. Does not report any skin rashes or lesions.  Does not report any pathological fractures.  Does not report any ecchymosis or petechiae.  Rest of his review of system is negative.  Medications: I have reviewed the  patient's current medications.  Current Outpatient Medications  Medication Sig Dispense Refill  . acetaminophen (TYLENOL) 500 MG tablet Take 1,000 mg by mouth daily.     Marland Kitchen amLODipine (NORVASC) 10 MG tablet Take 1 tablet (10 mg total) by mouth daily. 90 tablet 3  . atorvastatin (LIPITOR) 10 MG tablet TAKE 1 TABLET BY MOUTH DAILY 90 tablet 1  . B Complex-C (B-COMPLEX WITH VITAMIN C) tablet Take 1 tablet by mouth daily.      . Denosumab (XGEVA Boone) Inject into the skin every 30 (thirty) days. Monthly. Last inj was 8-19- 2016. Wife not sure what dose it is    . docusate sodium (COLACE) 100 MG capsule Take 100 mg by mouth 2 (two) times daily.    Marland Kitchen ezetimibe (ZETIA) 10 MG tablet Take 1 tablet (10 mg total) by mouth daily. 90 tablet 3  . furosemide (LASIX) 40 MG tablet Take 1 tablet (40 mg total) by mouth daily. 90 tablet 3  . Leuprolide Acetate (LUPRON DEPOT IM) Inject 1 each into the muscle every 6 (six) months. Last inj was in March 2016    . losartan (COZAAR) 100 MG tablet TAKE 1 TABLET BY MOUTH DAILY 90 tablet 1  . multivitamin (THERAGRAN) per tablet Take 1 tablet by mouth daily. ( with B - Complex )    . Omega-3 Fatty Acids (FISH OIL PO) Take 1 capsule by mouth daily. ( Mega Red )    . oxybutynin (DITROPAN) 5 MG tablet as needed.     Marland Kitchen  Probiotic Product (PROBIOTIC ADVANCED PO) Take 1 capsule by mouth daily.    . ranitidine (ZANTAC) 150 MG tablet Take 150 mg by mouth at bedtime.    Marland Kitchen warfarin (COUMADIN) 5 MG tablet TAKE 1 TABLET BY MOUTH AS DIRECTED BY THE COUMADIN CLINIC FOR 30 DAYS 30 tablet 4  . XTANDI 40 MG capsule TAKE 1 CAPSULE (40 MG) BY MOUTH ONCE DAILY 30 capsule 0   No current facility-administered medications for this visit.      Allergies:  Allergies  Allergen Reactions  . Pravachol Other (See Comments)    Muscle weakness  . Zocor [Simvastatin - High Dose] Other (See Comments)    Muscle weakness    Past Medical History, Surgical history, Social history, and Family History  updated and unchanged.   Physical Exam: Blood pressure 122/70, pulse 75, temperature 97.7 F (36.5 C), temperature source Oral, resp. rate 17, height 6' (1.829 m), SpO2 99 %.   ECOG: 2 General appearance: Alert, awake gentleman without distress today. Head: Normocephalic without abnormalities. Oropharynx: No oral ulcers or lesions. Eyes: Sclera anicteric. Lymph nodes: No lymphadenopathy palpated in the cervical, axillary or supraclavicular lymph nodes Heart: Regular rate without any murmurs or gallops. Lung: Clear in all lung fields without any wheezes or dullness to percussion. Abdomin: Soft, without any rebound or guarding. Musculoskeletal: No joint deformity or effusion. Skin: No ecchymosis or petechiae. Neurological examination: No focal deficits noted on motor or sensory exam.  Lab Results: Lab Results  Component Value Date   WBC 8.5 07/29/2017   HGB 13.6 07/29/2017   HCT 41.3 07/29/2017   MCV 95.1 07/29/2017   PLT 167 07/29/2017     Chemistry      Component Value Date/Time   NA 139 06/24/2017 1130   NA 139 02/18/2017 1125   K 4.3 06/24/2017 1130   K 4.1 02/18/2017 1125   CL 105 06/24/2017 1130   CO2 28 06/24/2017 1130   CO2 27 02/18/2017 1125   BUN 40 (H) 06/24/2017 1130   BUN 43.8 (H) 02/18/2017 1125   CREATININE 1.12 06/24/2017 1130   CREATININE 1.2 02/18/2017 1125      Component Value Date/Time   CALCIUM 10.5 (H) 06/24/2017 1130   CALCIUM 10.4 02/18/2017 1125   ALKPHOS 65 06/24/2017 1130   ALKPHOS 58 02/18/2017 1125   AST 13 06/24/2017 1130   AST 13 02/18/2017 1125   ALT 7 06/24/2017 1130   ALT 9 02/18/2017 1125   BILITOT 0.5 06/24/2017 1130   BILITOT 0.51 02/18/2017 1125      Results for Craig [name] (MRN [id number]) as of 07/29/2017 13:11  Ref. Range 05/24/2017 11:47 06/24/2017 11:30  Prostate Specific Ag, Serum Latest Ref Range: 0.0 - 4.0 ng/mL 106.2 (H) 109.8 (H)      Impression and Plan:  84 year old gentleman with the following  issues:   1. Castration-resistant prostate cancer with disease to the bone that remains asymptomatic diagnosed in 2014.  He tolerates Xtandi at 40 mg daily without any major complications.  His PSA remains relatively stable in the last few months and will be rechecked again.  The natural course of this disease was reviewed again with the patient and his wife.  He is a marginal candidate for any different or additional therapy at this time and the goal of treatment is palliative.  He is asymptomatic from his disease and I have recommended continuing Xtandi at this time unless he develops any specific symptoms related to his cancer.  2.  Androgen depravation: He will receive Lupron under the care of Dr. Junious Silk.  3. Bone directed therapy: I recommended continuing Xgeva at this time which she will receive monthly in the care of Dr. Junious Silk.   4.  Prognosis: He has an incurable malignancy and limited performance status.  His disease is asymptomatic at this time but will likely not receive any aggressive therapy moving forward.  5. Followup: In 4 weeks.  15  minutes was spent with the patient face-to-face today.  More than 50% of time was dedicated to patient counseling, education and discussing alternative treatment options in the future.  Zola Button, MD 6/7/20191:24 PM

## 2017-07-30 LAB — PROSTATE-SPECIFIC AG, SERUM (LABCORP): Prostate Specific Ag, Serum: 124.1 ng/mL — ABNORMAL HIGH (ref 0.0–4.0)

## 2017-08-01 ENCOUNTER — Telehealth: Payer: Self-pay | Admitting: Oncology

## 2017-08-01 ENCOUNTER — Ambulatory Visit (INDEPENDENT_AMBULATORY_CARE_PROVIDER_SITE_OTHER): Payer: Medicare Other | Admitting: Cardiology

## 2017-08-01 ENCOUNTER — Telehealth: Payer: Self-pay | Admitting: *Deleted

## 2017-08-01 DIAGNOSIS — Z5181 Encounter for therapeutic drug level monitoring: Secondary | ICD-10-CM | POA: Diagnosis not present

## 2017-08-01 DIAGNOSIS — I482 Chronic atrial fibrillation, unspecified: Secondary | ICD-10-CM

## 2017-08-01 LAB — POCT INR: INR: 1.6 — AB (ref 2.0–3.0)

## 2017-08-01 NOTE — Telephone Encounter (Signed)
-----   Message from Wyatt Portela, MD sent at 08/01/2017  9:37 AM EDT ----- Please let him know his PSA is slightly up. No change for now.

## 2017-08-01 NOTE — Telephone Encounter (Signed)
Per MD ok to reshcedule

## 2017-08-01 NOTE — Telephone Encounter (Signed)
As noted below by Dr. Shadad, I informed the wife of PSA result. She verbalized understanding. 

## 2017-08-01 NOTE — Patient Instructions (Signed)
Description   Spoke with pt's wife and instructed to have pt take 1.5 tablets today, then start taking 1/2 tablet daily except 1 tablet on Mondays, Thursdays, and Saturdays. Recheck in 1 week-Self-tester. Call with any concerns new medication or if scheduled for any procedures [phone]

## 2017-08-09 ENCOUNTER — Ambulatory Visit (INDEPENDENT_AMBULATORY_CARE_PROVIDER_SITE_OTHER): Payer: Medicare Other

## 2017-08-09 DIAGNOSIS — Z5181 Encounter for therapeutic drug level monitoring: Secondary | ICD-10-CM

## 2017-08-09 DIAGNOSIS — I482 Chronic atrial fibrillation, unspecified: Secondary | ICD-10-CM

## 2017-08-09 LAB — POCT INR: INR: 2.1 (ref 2.0–3.0)

## 2017-08-09 NOTE — Patient Instructions (Signed)
Description   Spoke with pt's wife and instructed to have pt continue taking 1/2 tablet daily except 1 tablet on Mondays, Thursdays, and Saturdays. Recheck in 2 weeks-Self-tester. Call with any concerns new medication or if scheduled for any procedures [phone]     

## 2017-08-11 ENCOUNTER — Encounter: Payer: Self-pay | Admitting: Interventional Cardiology

## 2017-08-11 ENCOUNTER — Other Ambulatory Visit: Payer: Self-pay | Admitting: Oncology

## 2017-08-11 ENCOUNTER — Ambulatory Visit: Payer: Medicare Other | Admitting: Interventional Cardiology

## 2017-08-11 ENCOUNTER — Other Ambulatory Visit: Payer: Self-pay | Admitting: *Deleted

## 2017-08-11 VITALS — BP 144/70 | HR 71 | Ht 72.0 in

## 2017-08-11 DIAGNOSIS — C61 Malignant neoplasm of prostate: Secondary | ICD-10-CM

## 2017-08-11 DIAGNOSIS — Z7901 Long term (current) use of anticoagulants: Secondary | ICD-10-CM

## 2017-08-11 DIAGNOSIS — I482 Chronic atrial fibrillation, unspecified: Secondary | ICD-10-CM

## 2017-08-11 DIAGNOSIS — E7800 Pure hypercholesterolemia, unspecified: Secondary | ICD-10-CM | POA: Diagnosis not present

## 2017-08-11 DIAGNOSIS — C7951 Secondary malignant neoplasm of bone: Secondary | ICD-10-CM

## 2017-08-11 LAB — LIPID PANEL
CHOLESTEROL TOTAL: 153 mg/dL (ref 100–199)
Chol/HDL Ratio: 3.1 ratio (ref 0.0–5.0)
HDL: 50 mg/dL (ref >39)
LDL Calculated: 77 mg/dL (ref 0–99)
TRIGLYCERIDES: 128 mg/dL (ref 0–149)
VLDL CHOLESTEROL CAL: 26 mg/dL (ref 5–40)

## 2017-08-11 MED ORDER — WARFARIN SODIUM 5 MG PO TABS: ORAL_TABLET | ORAL | 3 refills | Status: DC

## 2017-08-11 MED ORDER — FUROSEMIDE 40 MG PO TABS: 40.0000 mg | ORAL_TABLET | Freq: Every day | ORAL | 3 refills | Status: DC

## 2017-08-11 MED ORDER — LOSARTAN POTASSIUM 100 MG PO TABS: 100.0000 mg | ORAL_TABLET | Freq: Every day | ORAL | 3 refills | Status: DC

## 2017-08-11 MED ORDER — EZETIMIBE 10 MG PO TABS: 10.0000 mg | ORAL_TABLET | Freq: Every day | ORAL | 3 refills | Status: DC

## 2017-08-11 MED ORDER — AMLODIPINE BESYLATE 10 MG PO TABS: 10.0000 mg | ORAL_TABLET | Freq: Every day | ORAL | 3 refills | Status: DC

## 2017-08-11 MED ORDER — ATORVASTATIN CALCIUM 10 MG PO TABS: 10.0000 mg | ORAL_TABLET | Freq: Every day | ORAL | 3 refills | Status: DC

## 2017-08-11 NOTE — Telephone Encounter (Signed)
Pt here seeing Dr. Irish Lack and requesting a refill on Warfarin/Coumadin. Refill sent to requested pharmacy.

## 2017-08-11 NOTE — Progress Notes (Signed)
Cardiology Office Note   Date:  08/11/2017   ID:  Craig [name]., DOB 1924-10-11, MRN [id number]  PCP:  Craig Dance, DO    No chief complaint on file.  AFib  Wt Readings from Last 3 Encounters:  07/15/15 200 lb (90.7 kg)  04/14/15 200 lb (90.7 kg)  02/07/15 200 lb (90.7 kg)       History of Present Illness: Craig [name]. is a 84 y.o. male   retired radiologist,with history of metastatic prostate cancer, chronic kidney disease stage III, chronic diastolic CHF, hypertension, chronic atrial fibrillation, prior strokes on Coumadin is seen today for follow-up office visit. He has seen Dr. Mare [name] in the past. The patient was hospitalized in 2016 after patient had coughed up blood as noticed by patient's wife. . In the ER CT angiogram shows chronic pulmonary embolism. He was treated with IV heparin and then transition back to Coumadin and was discharged on Coumadin.  Now his Coumadin is checked at home. Range is 1.8 to 2.2.  The patient is in chronic atrial fibrillation.  He was hospitalized overnight from 12/31/14 until 01/01/15 because of a TIA. This occurred when his INR was 1.7. No changes were made in his regimen.   He is on XTandi for his metastatic prostate cancer. He gets some swelling from the prostate cancer medication. Improves with leg elevation. PSA has increased to 29.8. THey are continuing with the current meds.  Since the last visit, He has had some intermittent SHOB. THis is chronic.  No change.  No pains.  PSA continues to rise but no bone pain.    BP has been stable.  It is checked at home. Was normal at the [facility] with Dr. Alen [name].  Appetite has decreased.  He has lost weight based on how his clothes fit.  Difficult to weigh him.    Foot ulcer from before was evaluated and thought to not be melanoma. It hsa improved.     Past Medical History:  Diagnosis Date  . Atrial fibrillation ([place])   . Chronic back pain    . GERD (gastroesophageal reflux disease)   . H/O hiatal hernia   . History of epistaxis 07/19/2002  . Hyperlipidemia   . Hypertension   . Hypertensive heart disease   . Hypokalemia   . ICH (intracerebral hemorrhage) ([place]) ~ 1999   after TPA/notes 09/27/2012  . Melanoma ([place])    "top of my head" (09/27/2012)  . Migraines    "migraines without headaches years ago" (09/27/2012)  . Osteoarthritis of both feet   . PAT (paroxysmal atrial tachycardia) ([place])   . Personal history of long-term (current) use of anticoagulants   . Pneumonia    "once; several years ago" (09/27/2012)  . Prostate cancer, primary, with metastasis from prostate to other site [facility])    on Lupron injections per GU  . S/P radiation therapy 02/13/14-02/27/14   SRS 5/5 spine completed 02/27/14  . S/P radiation therapy 05/27/14-05/31/14   right femur 20Gy/51fx  . Stroke [facility]) ~ 1999   ischemic / right cerebellar/posterior inferior cerebellar artery / right pos infarct    Past Surgical History:  Procedure Laterality Date  . CATARACT EXTRACTION W/ INTRAOCULAR LENS  IMPLANT, BILATERAL Bilateral ~ 2011  . MELANOMA EXCISION  2010   "pre-melanoma on top of head; did skin graft from left thigh to cover" (09/27/2012)  . NASAL SINUS SURGERY  1998  . SKIN GRAFT Right 2010  . TONSILLECTOMY  ~  1935     Current Outpatient Medications  Medication Sig Dispense Refill  . acetaminophen (TYLENOL) 500 MG tablet Take 1,000 mg by mouth daily.     Marland Kitchen amLODipine (NORVASC) 10 MG tablet Take 1 tablet (10 mg total) by mouth daily. 90 tablet 3  . atorvastatin (LIPITOR) 10 MG tablet TAKE 1 TABLET BY MOUTH DAILY 90 tablet 1  . B Complex-C (B-COMPLEX WITH VITAMIN C) tablet Take 1 tablet by mouth daily.      . Denosumab (XGEVA [place]) Inject into the skin every 30 (thirty) days. Monthly. Last inj was 8-19- 2016. Wife not sure what dose it is    . docusate sodium (COLACE) 100 MG capsule Take 100 mg by mouth 2 (two) times daily.    Marland Kitchen ezetimibe (ZETIA) 10 MG tablet  Take 1 tablet (10 mg total) by mouth daily. 90 tablet 3  . furosemide (LASIX) 40 MG tablet Take 1 tablet (40 mg total) by mouth daily. 90 tablet 3  . Leuprolide Acetate (LUPRON DEPOT IM) Inject 1 each into the muscle every 6 (six) months. Last inj was in March 2016    . losartan (COZAAR) 100 MG tablet TAKE 1 TABLET BY MOUTH DAILY 90 tablet 1  . multivitamin (THERAGRAN) per tablet Take 1 tablet by mouth daily. ( with B - Complex )    . Omega-3 Fatty Acids (FISH OIL PO) Take 1 capsule by mouth daily. ( Mega Red )    . oxybutynin (DITROPAN) 5 MG tablet as needed.     . Probiotic Product (PROBIOTIC ADVANCED PO) Take 1 capsule by mouth daily.    . ranitidine (ZANTAC) 150 MG tablet Take 150 mg by mouth at bedtime.    Marland Kitchen warfarin (COUMADIN) 5 MG tablet TAKE 1 TABLET BY MOUTH AS DIRECTED BY THE COUMADIN CLINIC FOR 30 DAYS 30 tablet 4  . XTANDI 40 MG capsule TAKE 1 CAPSULE (40 MG) BY MOUTH ONCE DAILY 30 capsule 0   No current facility-administered medications for this visit.     Allergies:   Pravachol and Zocor [simvastatin - high dose]    Social History:  The patient  reports that he quit smoking about 64 years ago. His smoking use included cigarettes. He quit after 0.50 years of use. He has never used smokeless tobacco. He reports that he does not drink alcohol or use drugs.   Family History:  The patient's family history includes Heart attack in his father; Heart failure in his mother.    ROS:  Please see the history of present illness.   Otherwise, review of systems are positive for ankle edema. No internal bleeding.  Has indwelling catheter.  All other systems are reviewed and negative.    PHYSICAL EXAM: VS:  BP (!) 144/70   Pulse 71   Ht 6' (1.829 m)   SpO2 95%   BMI 27.12 kg/m  , BMI Body mass index is 27.12 kg/m. GEN: Well nourished, well developed, in no acute distress  HEENT: normal  Neck: no JVD, carotid bruits, or masses Cardiac: irregularly irregular; no murmurs, rubs, or  gallops,; bilateral ankle edema  Respiratory:  clear to auscultation bilaterally, normal work of breathing GI: soft, nontender, nondistended, + BS MS: no deformity or atrophy  Skin: warm and dry, no rash Neuro:  Strength and sensation are intact Psych: euthymic mood, full affect     Recent Labs: 07/29/2017: ALT 6; BUN 42; Creatinine 1.22; Hemoglobin 13.6; Platelet Count 167; Potassium 4.6; Sodium 139   Lipid Panel  Component Value Date/Time   CHOL 161 07/21/2016 1150   TRIG 157 (H) 07/21/2016 1150   HDL 49 07/21/2016 1150   CHOLHDL 3.3 07/21/2016 1150   CHOLHDL 2.4 07/15/2015 1156   VLDL 26 07/15/2015 1156   LDLCALC 81 07/21/2016 1150     Other studies Reviewed: Additional studies/ records that were reviewed today with results demonstrating: 2016 echocardiogram showing decreased ejection fraction. Lipids as above.   ASSESSMENT AND PLAN:  1. AFib: Rate controlled.  Continue current medications.  Likely contributing to his chronic dyspnea along with deconditioning, prior pulmonary embolism 2. Hypertensive heart disease//chronic systolic heart failure: Blood pressures checked at home- well controlled.  Ejection fraction 35 to 40% in 2016. Continue losartan and Lasix.   3. Anticoagulated: Warfarin for stroke prevention.  He was 1.6 recently, then up to 2.1.  4. CKD III: Stable by labs a few weeks ago. 5. Aortic atherosclerosis: LDL well controlled in 2018. Check lipids.  Continue statin.  Foot ulcer from before was evaluated and thought to not be melanoma.    Current medicines are reviewed at length with the patient today.  The patient concerns regarding his medicines were addressed.  The following changes have been made:  No change  Labs/ tests ordered today include:  No orders of the defined types were placed in this encounter.   Recommend 150 minutes/week of aerobic exercise Low fat, low carb, high fiber diet recommended  Disposition:   FU in 6  months   Signed, Larae Grooms, MD  08/11/2017 9:57 AM    [facility] [address], [place], [place]  [address] Phone: [phone]; Fax: [phone]

## 2017-08-11 NOTE — Patient Instructions (Signed)
Medication Instructions:  Your physician recommends that you continue on your current medications as directed. Please refer to the Current Medication list given to you today.   Labwork: TODAY: LIPIDS  Testing/Procedures: None ordered  Follow-Up: Your physician wants you to follow-up in: 6 months with Dr. Varanasi. You will receive a reminder letter in the mail two months in advance. If you don't receive a letter, please call our office to schedule the follow-up appointment.   Any Other Special Instructions Will Be Listed Below (If Applicable).     If you need a refill on your cardiac medications before your next appointment, please call your pharmacy.   

## 2017-08-15 ENCOUNTER — Other Ambulatory Visit: Payer: Self-pay | Admitting: *Deleted

## 2017-08-15 MED ORDER — WARFARIN SODIUM 5 MG PO TABS: ORAL_TABLET | ORAL | 1 refills | Status: DC

## 2017-08-22 ENCOUNTER — Ambulatory Visit (INDEPENDENT_AMBULATORY_CARE_PROVIDER_SITE_OTHER): Payer: Medicare Other | Admitting: Interventional Cardiology

## 2017-08-22 DIAGNOSIS — G464 Cerebellar stroke syndrome: Secondary | ICD-10-CM | POA: Diagnosis not present

## 2017-08-22 DIAGNOSIS — Z5181 Encounter for therapeutic drug level monitoring: Secondary | ICD-10-CM

## 2017-08-22 DIAGNOSIS — I482 Chronic atrial fibrillation, unspecified: Secondary | ICD-10-CM

## 2017-08-22 DIAGNOSIS — Z8673 Personal history of transient ischemic attack (TIA), and cerebral infarction without residual deficits: Secondary | ICD-10-CM | POA: Diagnosis not present

## 2017-08-22 LAB — POCT INR: INR: 1.9 — AB (ref 2.0–3.0)

## 2017-08-22 NOTE — Patient Instructions (Signed)
Description   Spoke with pt's wife and instructed to have pt continue taking 1/2 tablet daily except 1 tablet on Mondays, Thursdays, and Saturdays. Recheck in 2 weeks-Self-tester. Call with any concerns new medication or if scheduled for any procedures [phone]

## 2017-08-28 DIAGNOSIS — I480 Paroxysmal atrial fibrillation: Secondary | ICD-10-CM | POA: Diagnosis not present

## 2017-08-30 ENCOUNTER — Other Ambulatory Visit: Payer: Medicare Other

## 2017-08-30 ENCOUNTER — Inpatient Hospital Stay: Payer: Medicare Other | Admitting: Oncology

## 2017-08-30 ENCOUNTER — Telehealth: Payer: Self-pay

## 2017-08-30 ENCOUNTER — Inpatient Hospital Stay: Payer: Medicare Other | Attending: Oncology

## 2017-08-30 ENCOUNTER — Ambulatory Visit: Payer: Medicare Other | Admitting: Oncology

## 2017-08-30 VITALS — BP 165/86 | HR 59 | Temp 97.7°F | Resp 17 | Ht 72.0 in

## 2017-08-30 DIAGNOSIS — R338 Other retention of urine: Secondary | ICD-10-CM | POA: Diagnosis not present

## 2017-08-30 DIAGNOSIS — C61 Malignant neoplasm of prostate: Secondary | ICD-10-CM

## 2017-08-30 DIAGNOSIS — Z192 Hormone resistant malignancy status: Secondary | ICD-10-CM | POA: Diagnosis not present

## 2017-08-30 DIAGNOSIS — Z79899 Other long term (current) drug therapy: Secondary | ICD-10-CM | POA: Diagnosis not present

## 2017-08-30 DIAGNOSIS — C7951 Secondary malignant neoplasm of bone: Secondary | ICD-10-CM | POA: Insufficient documentation

## 2017-08-30 DIAGNOSIS — Z9221 Personal history of antineoplastic chemotherapy: Secondary | ICD-10-CM | POA: Diagnosis not present

## 2017-08-30 DIAGNOSIS — Z923 Personal history of irradiation: Secondary | ICD-10-CM | POA: Insufficient documentation

## 2017-08-30 DIAGNOSIS — Z7901 Long term (current) use of anticoagulants: Secondary | ICD-10-CM | POA: Insufficient documentation

## 2017-08-30 LAB — CMP (CANCER CENTER ONLY)
ALT: 10 U/L (ref 0–44)
ANION GAP: 7 (ref 5–15)
AST: 12 U/L — ABNORMAL LOW (ref 15–41)
Albumin: 3.4 g/dL — ABNORMAL LOW (ref 3.5–5.0)
Alkaline Phosphatase: 64 U/L (ref 38–126)
BUN: 39 mg/dL — ABNORMAL HIGH (ref 8–23)
CHLORIDE: 102 mmol/L (ref 98–111)
CO2: 31 mmol/L (ref 22–32)
Calcium: 10.9 mg/dL — ABNORMAL HIGH (ref 8.9–10.3)
Creatinine: 1.22 mg/dL (ref 0.61–1.24)
GFR, EST AFRICAN AMERICAN: 58 mL/min — AB (ref >60)
GFR, EST NON AFRICAN AMERICAN: 50 mL/min — AB (ref >60)
Glucose, Bld: 115 mg/dL — ABNORMAL HIGH (ref 70–99)
POTASSIUM: 4.5 mmol/L (ref 3.5–5.1)
SODIUM: 140 mmol/L (ref 135–145)
Total Bilirubin: 0.5 mg/dL (ref 0.3–1.2)
Total Protein: 7.2 g/dL (ref 6.5–8.1)

## 2017-08-30 LAB — CBC WITH DIFFERENTIAL (CANCER CENTER ONLY)
Basophils Absolute: 0 10*3/uL (ref 0.0–0.1)
Basophils Relative: 0 %
EOS ABS: 0.4 10*3/uL (ref 0.0–0.5)
Eosinophils Relative: 4 %
HEMATOCRIT: 41 % (ref 38.4–49.9)
HEMOGLOBIN: 13.3 g/dL (ref 13.0–17.1)
LYMPHS ABS: 2.2 10*3/uL (ref 0.9–3.3)
LYMPHS PCT: 25 %
MCH: 31.4 pg (ref 27.2–33.4)
MCHC: 32.4 g/dL (ref 32.0–36.0)
MCV: 96.7 fL (ref 79.3–98.0)
Monocytes Absolute: 0.7 10*3/uL (ref 0.1–0.9)
Monocytes Relative: 8 %
NEUTROS ABS: 5.6 10*3/uL (ref 1.5–6.5)
NEUTROS PCT: 63 %
Platelet Count: 166 10*3/uL (ref 140–400)
RBC: 4.24 MIL/uL (ref 4.20–5.82)
RDW: 14.8 % — ABNORMAL HIGH (ref 11.0–14.6)
WBC Count: 8.9 10*3/uL (ref 4.0–10.3)

## 2017-08-30 NOTE — Telephone Encounter (Signed)
PRINTED AVS AND CALENDER OF UPCOMING APPOINTMENT. PER 7/9 LOS 

## 2017-08-30 NOTE — Progress Notes (Signed)
Hematology and Oncology Follow Up Visit  [name] [name] [id number] [birth year] 84 y.o. 08/30/2017 12:09 PM [name] [name] [name] [name]   Principle Diagnosis: 84 year old man with castration-resistant prostate cancer with disease to the bone.     Prior Therapy: He is status post androgen deprivation with Lupron and Casodex with an excellent response initially and had a PSA nadir down to 1.67. He is status post SRS treatment to the L4 completed on 02/13/2014. He is S/P stereotactic body radiotherapy to the iliac bone treatment completed in 02/2014. He is status post radiation therapy to the right femur completed on 06/07/2014.  Current therapy:  He is on Xtandi started on 11/15/2012.  He remains on 40 mg daily for better tolerance.  He is on Lupron and Xgeva done at [facility].  .  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  He reports no changes in his clinical status without any new complaints since the last visit.  He continues to take [place] daily without any new issues.  He denies any recent hospitalizations or illnesses.  He denies any new bone pain or pathological fractures.  His mobility remains limited although his quality of life is unchanged.  He denies any falls or syncope.   He does not report any headaches, blurry vision, syncope or seizures.  He denies any anxiety or depression.  He does not report any fevers, chills or sweats.  His appetite has declined no loss of weight.  He does not report any chest pain, palpitation orthopnea. Has not reported any cough, wheezing or hemoptysis.  Does not report any nausea or vomiting or abdominal pain. Does not report any hematochezia or melena.  He denies any constipation or diarrhea.  Does not report any skin rashes or lesions.  Does not report any paralysis or myalgias.  Does not report any ecchymosis or petechiae.  He denies any bleeding or thrombosis issues.  Rest of his review of system is  negative.  Medications: I have reviewed the patient's current medications.  Current Outpatient Medications  Medication Sig Dispense Refill  . acetaminophen (TYLENOL) 500 MG tablet Take 1,000 mg by mouth daily.     Marland Kitchen amLODipine (NORVASC) 10 MG tablet Take 1 tablet (10 mg total) by mouth daily. 90 tablet 3  . atorvastatin (LIPITOR) 10 MG tablet Take 1 tablet (10 mg total) by mouth daily. 90 tablet 3  . B Complex-C (B-COMPLEX WITH VITAMIN C) tablet Take 1 tablet by mouth daily.      . Denosumab (XGEVA [place]) Inject into the skin every 30 (thirty) days. Monthly. Last inj was 8-19- 2016. Wife not sure what dose it is    . docusate sodium (COLACE) 100 MG capsule Take 100 mg by mouth 2 (two) times daily.    Marland Kitchen ezetimibe (ZETIA) 10 MG tablet Take 1 tablet (10 mg total) by mouth daily. 90 tablet 3  . furosemide (LASIX) 40 MG tablet Take 1 tablet (40 mg total) by mouth daily. 90 tablet 3  . Leuprolide Acetate (LUPRON DEPOT IM) Inject 1 each into the muscle every 6 (six) months. Last inj was in March 2016    . losartan (COZAAR) 100 MG tablet Take 1 tablet (100 mg total) by mouth daily. 90 tablet 3  . multivitamin (THERAGRAN) per tablet Take 1 tablet by mouth daily. ( with B - Complex )    . Omega-3 Fatty Acids (FISH OIL PO) Take 1 capsule by mouth daily. ( Mega Red )    .  oxybutynin (DITROPAN) 5 MG tablet as needed.     . Probiotic Product (PROBIOTIC ADVANCED PO) Take 1 capsule by mouth daily.    . ranitidine (ZANTAC) 150 MG tablet Take 150 mg by mouth at bedtime.    Marland Kitchen warfarin (COUMADIN) 5 MG tablet TAKE 1 TABLET BY MOUTH AS DIRECTED BY THE COUMADIN CLINIC 90 tablet 1  . XTANDI 40 MG capsule TAKE 1 CAPSULE (40 MG) BY MOUTH ONCE DAILY 30 capsule 0   No current facility-administered medications for this visit.      Allergies:  Allergies  Allergen Reactions  . Pravachol Other (See Comments)    Muscle weakness  . Zocor [Simvastatin - High Dose] Other (See Comments)    Muscle weakness    Past  Medical History, Surgical history, Social history, and Family History updated and unchanged.   Physical Exam: Blood pressure (!) 165/86, pulse (!) 59, temperature 97.7 F (36.5 C), temperature source Oral, resp. rate 17, height 6' (1.829 m), SpO2 98 %.   ECOG: 2 General appearance: Comfortable appearing gentleman without distress. Head: Atraumatic without abnormalities.. Oropharynx: No oral thrush or ulcers. Eyes: Pupils are equal and round reactive to light. Lymph nodes: No cervical, axillary or supraclavicular lymphadenopathy. Heart: Regular rate without any gallops.  S1 and S2 and no leg edema. Lung: Clear without any rhonchi, wheezes or dullness to percussion. Abdomin: Soft, nontender, nondistended good bowel sounds. Musculoskeletal: No clubbing or cyanosis. Skin: No skin rashes or lesions. Neurological examination: No deficits noted today motor or sensory exam.  Lab Results: Lab Results  Component Value Date   WBC 8.9 08/30/2017   HGB 13.3 08/30/2017   HCT 41.0 08/30/2017   MCV 96.7 08/30/2017   PLT 166 08/30/2017     Chemistry      Component Value Date/Time   NA 139 07/29/2017 1247   NA 139 02/18/2017 1125   K 4.6 07/29/2017 1247   K 4.1 02/18/2017 1125   CL 101 07/29/2017 1247   CO2 29 07/29/2017 1247   CO2 27 02/18/2017 1125   BUN 42 (H) 07/29/2017 1247   BUN 43.8 (H) 02/18/2017 1125   CREATININE 1.22 07/29/2017 1247   CREATININE 1.2 02/18/2017 1125      Component Value Date/Time   CALCIUM 11.0 (H) 07/29/2017 1247   CALCIUM 10.4 02/18/2017 1125   ALKPHOS 67 07/29/2017 1247   ALKPHOS 58 02/18/2017 1125   AST 13 07/29/2017 1247   AST 13 02/18/2017 1125   ALT 6 07/29/2017 1247   ALT 9 02/18/2017 1125   BILITOT 0.5 07/29/2017 1247   BILITOT 0.51 02/18/2017 1125       Results for [name], [name] (MRN [id number]) as of 08/30/2017 12:01  Ref. Range 06/24/2017 11:30 07/29/2017 12:47  Prostate Specific Ag, Serum Latest Ref Range: 0.0 - 4.0 ng/mL 109.8 (H)  124.1 (H)      Impression and Plan:  84 year old gentleman with the following issues:   1. Castration-resistant prostate cancer with disease to the bone and has progressed on previous therapies outlined above.  He remains on Xtandi without any new complications.  His PSA continues to rise although he is asymptomatic.  Risks and benefits of continuing this medication versus switching to Zytiga or systemic chemotherapy was reviewed.  Given his overall frail status and the fact that he is asymptomatic, he elected to continue with the same dose and schedule.  Bone scan will be repeated in the future but based on his request will be deferred to later  in the year.  2. Androgen depravation: Continues to receive Lupron under the care of Dr. Junious Silk.  No complications related to this medication noted.  3. Bone directed therapy: He continues to receive Xgeva without complications.  4.  Prognosis: His disease remains incurable although he is asymptomatic from it at this time.  He understands that treatments will not provide any curative intent in the future.  5. Followup: In 4 weeks.  15  minutes was spent with the patient face-to-face today.  More than 50% of time was dedicated to patient counseling, education and coordinating his future plan of care including alternative treatment options.  Zola Button, MD 7/9/201912:09 PM

## 2017-08-31 ENCOUNTER — Telehealth: Payer: Self-pay

## 2017-08-31 LAB — PROSTATE-SPECIFIC AG, SERUM (LABCORP): PROSTATE SPECIFIC AG, SERUM: 117.6 ng/mL — AB (ref 0.0–4.0)

## 2017-08-31 NOTE — Telephone Encounter (Signed)
Per Dr. Alen Blew, called pt wife to inform her that pt PSA is down to 117.6. Pt wife and husband verbalized understanding and thanks for the information.

## 2017-09-05 ENCOUNTER — Ambulatory Visit (INDEPENDENT_AMBULATORY_CARE_PROVIDER_SITE_OTHER): Payer: Medicare Other | Admitting: Cardiology

## 2017-09-05 DIAGNOSIS — Z8673 Personal history of transient ischemic attack (TIA), and cerebral infarction without residual deficits: Secondary | ICD-10-CM

## 2017-09-05 DIAGNOSIS — Z5181 Encounter for therapeutic drug level monitoring: Secondary | ICD-10-CM

## 2017-09-05 DIAGNOSIS — I482 Chronic atrial fibrillation, unspecified: Secondary | ICD-10-CM

## 2017-09-05 LAB — POCT INR: INR: 1.8 — AB (ref 2.0–3.0)

## 2017-09-05 NOTE — Patient Instructions (Signed)
Description   Spoke with pt's wife and instructed to have pt continue taking 1/2 tablet daily except 1 tablet on Mondays, Thursdays, and Saturdays. Recheck in 2 weeks-Self-tester. Call with any concerns new medication or if scheduled for any procedures [phone]

## 2017-09-12 ENCOUNTER — Other Ambulatory Visit: Payer: Self-pay | Admitting: Oncology

## 2017-09-12 DIAGNOSIS — C61 Malignant neoplasm of prostate: Secondary | ICD-10-CM

## 2017-09-12 DIAGNOSIS — C7951 Secondary malignant neoplasm of bone: Secondary | ICD-10-CM

## 2017-09-19 ENCOUNTER — Ambulatory Visit (INDEPENDENT_AMBULATORY_CARE_PROVIDER_SITE_OTHER): Payer: Medicare Other

## 2017-09-19 DIAGNOSIS — Z5181 Encounter for therapeutic drug level monitoring: Secondary | ICD-10-CM | POA: Diagnosis not present

## 2017-09-19 DIAGNOSIS — I482 Chronic atrial fibrillation, unspecified: Secondary | ICD-10-CM

## 2017-09-19 LAB — POCT INR: INR: 1.8 — AB (ref 2.0–3.0)

## 2017-09-19 NOTE — Patient Instructions (Signed)
Description   Spoke with pt's wife and instructed to have pt continue taking 1/2 tablet daily except 1 tablet on Mondays, Thursdays, and Saturdays. Recheck in 2 weeks-Self-tester. Call with any concerns new medication or if scheduled for any procedures [phone]

## 2017-09-30 ENCOUNTER — Inpatient Hospital Stay: Payer: Medicare Other | Attending: Oncology | Admitting: Oncology

## 2017-09-30 ENCOUNTER — Telehealth: Payer: Self-pay

## 2017-09-30 ENCOUNTER — Inpatient Hospital Stay: Payer: Medicare Other

## 2017-09-30 VITALS — BP 144/78 | HR 69 | Temp 97.5°F | Resp 17 | Ht 72.0 in

## 2017-09-30 DIAGNOSIS — Z7901 Long term (current) use of anticoagulants: Secondary | ICD-10-CM | POA: Diagnosis not present

## 2017-09-30 DIAGNOSIS — C61 Malignant neoplasm of prostate: Secondary | ICD-10-CM

## 2017-09-30 DIAGNOSIS — R630 Anorexia: Secondary | ICD-10-CM | POA: Insufficient documentation

## 2017-09-30 DIAGNOSIS — Z923 Personal history of irradiation: Secondary | ICD-10-CM | POA: Diagnosis not present

## 2017-09-30 DIAGNOSIS — Z79899 Other long term (current) drug therapy: Secondary | ICD-10-CM | POA: Insufficient documentation

## 2017-09-30 DIAGNOSIS — R338 Other retention of urine: Secondary | ICD-10-CM | POA: Diagnosis not present

## 2017-09-30 DIAGNOSIS — C7951 Secondary malignant neoplasm of bone: Secondary | ICD-10-CM | POA: Insufficient documentation

## 2017-09-30 DIAGNOSIS — Z192 Hormone resistant malignancy status: Secondary | ICD-10-CM | POA: Diagnosis not present

## 2017-09-30 LAB — CMP (CANCER CENTER ONLY)
ALT: 11 U/L (ref 0–44)
AST: 13 U/L — ABNORMAL LOW (ref 15–41)
Albumin: 3.1 g/dL — ABNORMAL LOW (ref 3.5–5.0)
Alkaline Phosphatase: 65 U/L (ref 38–126)
Anion gap: 8 (ref 5–15)
BILIRUBIN TOTAL: 0.6 mg/dL (ref 0.3–1.2)
BUN: 39 mg/dL — ABNORMAL HIGH (ref 8–23)
CALCIUM: 10.4 mg/dL — AB (ref 8.9–10.3)
CO2: 29 mmol/L (ref 22–32)
Chloride: 102 mmol/L (ref 98–111)
Creatinine: 1.13 mg/dL (ref 0.61–1.24)
GFR, EST NON AFRICAN AMERICAN: 55 mL/min — AB (ref >60)
Glucose, Bld: 106 mg/dL — ABNORMAL HIGH (ref 70–99)
Potassium: 4.7 mmol/L (ref 3.5–5.1)
Sodium: 139 mmol/L (ref 135–145)
TOTAL PROTEIN: 7 g/dL (ref 6.5–8.1)

## 2017-09-30 LAB — CBC WITH DIFFERENTIAL (CANCER CENTER ONLY)
BASOS ABS: 0 10*3/uL (ref 0.0–0.1)
BASOS PCT: 0 %
EOS ABS: 0.2 10*3/uL (ref 0.0–0.5)
Eosinophils Relative: 3 %
HCT: 41.1 % (ref 38.4–49.9)
Hemoglobin: 13.5 g/dL (ref 13.0–17.1)
Lymphocytes Relative: 21 %
Lymphs Abs: 1.8 10*3/uL (ref 0.9–3.3)
MCH: 31.8 pg (ref 27.2–33.4)
MCHC: 32.8 g/dL (ref 32.0–36.0)
MCV: 96.7 fL (ref 79.3–98.0)
MONO ABS: 0.9 10*3/uL (ref 0.1–0.9)
MONOS PCT: 10 %
Neutro Abs: 5.9 10*3/uL (ref 1.5–6.5)
Neutrophils Relative %: 66 %
Platelet Count: 160 10*3/uL (ref 140–400)
RBC: 4.25 MIL/uL (ref 4.20–5.82)
RDW: 14.8 % — AB (ref 11.0–14.6)
WBC Count: 8.9 10*3/uL (ref 4.0–10.3)

## 2017-09-30 NOTE — Telephone Encounter (Signed)
Printed avs and calender of upcoming appointment. Per 8/9 los 

## 2017-09-30 NOTE — Progress Notes (Signed)
Hematology and Oncology Follow Up Visit  Craig [name] [id number] 02-27-1924 84 y.o. 09/30/2017 12:47 PM [name], [name], DO   Principle Diagnosis: 84 year old man with castration-resistant prostate cancer diagnosed and 2014 with disease to the bone.     Prior Therapy: He is status post androgen deprivation with Lupron and Casodex with an excellent response initially and had a PSA nadir down to 1.67. He is status post SRS treatment to the L4 completed on 02/13/2014. He is S/P stereotactic body radiotherapy to the iliac bone treatment completed in 02/2014. He is status post radiation therapy to the right femur completed on 06/07/2014.  Current therapy:  He is on Xtandi started on 11/15/2012.  He remains on 40 mg daily for better tolerance.  He is on Lupron and Xgeva done at [facility].  .  Interim History: Dr. Belva [name] resents today for a follow-up visit.  Since last visit, he does not report any major changes.  Per his wife, his appetite has decreased and his eating habits has fluctuated.  His clinical status has not changed much at this time and continues to be limited in his performance status.  He is chair bound for the most part and is able to pivot with assistance.  He denies any increased pain, falls or pathological fractures.  He denies any worsening edema or other complications related to [facility].  He continues to take this medication regularly.  He does not report any headaches, blurry vision, syncope or seizures.  He denies any alteration in mental status or confusion.  He does not report any fevers, chills or sweats.  He does not report any chest pain, palpitation orthopnea. Has not reported any cough, wheezing or hemoptysis.  Does not report any nausea or vomiting or abdominal pain. Does not report any angina his bowel habits.  Does not report any skin rashes or lesions.  Does not report any bone pain or arthralgias.  Does not report any ecchymosis or  petechiae.  He denies any heat or cold intolerance.  Rest of his review of system is negative.  Medications: I have reviewed the patient's current medications.  Current Outpatient Medications  Medication Sig Dispense Refill  . acetaminophen (TYLENOL) 500 MG tablet Take 1,000 mg by mouth daily.     Marland Kitchen amLODipine (NORVASC) 10 MG tablet Take 1 tablet (10 mg total) by mouth daily. 90 tablet 3  . atorvastatin (LIPITOR) 10 MG tablet Take 1 tablet (10 mg total) by mouth daily. 90 tablet 3  . B Complex-C (B-COMPLEX WITH VITAMIN C) tablet Take 1 tablet by mouth daily.      . Denosumab (XGEVA [place]) Inject into the skin every 30 (thirty) days. Monthly. Last inj was 8-19- 2016. Wife not sure what dose it is    . docusate sodium (COLACE) 100 MG capsule Take 100 mg by mouth 2 (two) times daily.    Marland Kitchen ezetimibe (ZETIA) 10 MG tablet Take 1 tablet (10 mg total) by mouth daily. 90 tablet 3  . furosemide (LASIX) 40 MG tablet Take 1 tablet (40 mg total) by mouth daily. 90 tablet 3  . Leuprolide Acetate (LUPRON DEPOT IM) Inject 1 each into the muscle every 6 (six) months. Last inj was in March 2016    . losartan (COZAAR) 100 MG tablet Take 1 tablet (100 mg total) by mouth daily. 90 tablet 3  . multivitamin (THERAGRAN) per tablet Take 1 tablet by mouth daily. ( with B - Complex )    .  Omega-3 Fatty Acids (FISH OIL PO) Take 1 capsule by mouth daily. ( Mega Red )    . oxybutynin (DITROPAN) 5 MG tablet as needed.     . Probiotic Product (PROBIOTIC ADVANCED PO) Take 1 capsule by mouth daily.    . ranitidine (ZANTAC) 150 MG tablet Take 150 mg by mouth at bedtime.    Marland Kitchen warfarin (COUMADIN) 5 MG tablet TAKE 1 TABLET BY MOUTH AS DIRECTED BY THE COUMADIN CLINIC 90 tablet 1  . XTANDI 40 MG capsule TAKE 1 CAPSULE (40 MG) BY MOUTH ONCE DAILY 30 capsule 0   No current facility-administered medications for this visit.      Allergies:  Allergies  Allergen Reactions  . Pravachol Other (See Comments)    Muscle weakness  . Zocor  [Simvastatin - High Dose] Other (See Comments)    Muscle weakness    Past Medical History, Surgical history, Social history, and Family History updated and unchanged.   Physical Exam: Blood pressure (!) 144/78, pulse 69, temperature (!) 97.5 F (36.4 C), temperature source Oral, resp. rate 17, height 6' (1.829 m), SpO2 98 %.   ECOG: 2 General appearance: Alert, awake gentleman without distress Head: Normocephalic without abnormalities.   Oropharynx: Mucous membranes are moist and pink without thrush. Eyes: Sclera anicteric. Lymph nodes: No lymphadenopathy noted in the cervical, axillary or supraclavicular regions. Heart: Regular rate and rhythm without any murmurs or gallops. Lung: Clear without any rhonchi, wheezes or dullness to percussion. Abdomin: Soft, without any rebound or guarding.  Bowel sounds. Musculoskeletal: No no joint deformity or effusion. Skin: No ecchymosis or petechiae. Neurological examination: No motor or sensory deficits.  Lab Results: Lab Results  Component Value Date   WBC 8.9 09/30/2017   HGB 13.5 09/30/2017   HCT 41.1 09/30/2017   MCV 96.7 09/30/2017   PLT 160 09/30/2017     Chemistry      Component Value Date/Time   NA 140 08/30/2017 1052   NA 139 02/18/2017 1125   K 4.5 08/30/2017 1052   K 4.1 02/18/2017 1125   CL 102 08/30/2017 1052   CO2 31 08/30/2017 1052   CO2 27 02/18/2017 1125   BUN 39 (H) 08/30/2017 1052   BUN 43.8 (H) 02/18/2017 1125   CREATININE 1.22 08/30/2017 1052   CREATININE 1.2 02/18/2017 1125      Component Value Date/Time   CALCIUM 10.9 (H) 08/30/2017 1052   CALCIUM 10.4 02/18/2017 1125   ALKPHOS 64 08/30/2017 1052   ALKPHOS 58 02/18/2017 1125   AST 12 (L) 08/30/2017 1052   AST 13 02/18/2017 1125   ALT 10 08/30/2017 1052   ALT 9 02/18/2017 1125   BILITOT 0.5 08/30/2017 1052   BILITOT 0.51 02/18/2017 1125       Results for Craig [name] (MRN [id number]) as of 09/30/2017 12:35  Ref. Range 07/29/2017 12:47  08/30/2017 10:52  Prostate Specific Ag, Serum Latest Ref Range: 0.0 - 4.0 ng/mL 124.1 (H) 117.6 (H)       Impression and Plan:  84 year old man with:   1. Castration-resistant prostate cancer with disease to the bone remains on Xtandi without any clinical changes.  PSA from July 2019 was personally reviewed and discussed with the patient.  It did show some mild decrease and his clinical status remains stable.  He has not reported any symptoms related to his prostate cancer at this time and no changes as recommended.  Risks and benefits of continuing this therapy and alternative approaches were reviewed and  is agreeable to continue at this time.  Alternative options could be considered in the future.  2. Androgen depravation: He remains on Lupron under the care of Dr. Junious Silk which I recommended to continue indefinitely.  3. Bone directed therapy: Virgel Paling under the care of Dr. Junious Silk without any recent issues.  I recommended continuing this therapy for the time being.  4.  Prognosis: Treatment is palliative at this time although his clinical status remains stable and aggressive therapy is warranted.  5. Followup: In 4 weeks.  15  minutes was spent with the patient face-to-face today.  More than 50% of time was dedicated to discussing the natural course of this disease, assessing his clinical status and discussing future treatment options.  Zola Button, MD 8/9/201912:47 PM

## 2017-10-01 LAB — PROSTATE-SPECIFIC AG, SERUM (LABCORP): Prostate Specific Ag, Serum: 143 ng/mL — ABNORMAL HIGH (ref 0.0–4.0)

## 2017-10-03 ENCOUNTER — Ambulatory Visit (INDEPENDENT_AMBULATORY_CARE_PROVIDER_SITE_OTHER): Payer: Medicare Other | Admitting: Pharmacist

## 2017-10-03 ENCOUNTER — Telehealth: Payer: Self-pay | Admitting: *Deleted

## 2017-10-03 DIAGNOSIS — I482 Chronic atrial fibrillation, unspecified: Secondary | ICD-10-CM

## 2017-10-03 DIAGNOSIS — Z5181 Encounter for therapeutic drug level monitoring: Secondary | ICD-10-CM

## 2017-10-03 LAB — POCT INR: INR: 1.8 — AB (ref 2.0–3.0)

## 2017-10-03 NOTE — Telephone Encounter (Signed)
As noted below by Dr. Alen Blew, I informed the wife of PSA result. She verbalized understanding.

## 2017-10-03 NOTE — Telephone Encounter (Signed)
-----   Message from Wyatt Portela, MD sent at 10/03/2017  9:16 AM EDT ----- Please let his wife know his PSA is up. No changes for now.

## 2017-10-14 ENCOUNTER — Other Ambulatory Visit: Payer: Self-pay | Admitting: Oncology

## 2017-10-14 DIAGNOSIS — C7951 Secondary malignant neoplasm of bone: Secondary | ICD-10-CM

## 2017-10-14 DIAGNOSIS — C61 Malignant neoplasm of prostate: Secondary | ICD-10-CM

## 2017-10-17 ENCOUNTER — Ambulatory Visit (INDEPENDENT_AMBULATORY_CARE_PROVIDER_SITE_OTHER): Payer: Medicare Other | Admitting: Cardiovascular Disease

## 2017-10-17 DIAGNOSIS — Z5181 Encounter for therapeutic drug level monitoring: Secondary | ICD-10-CM | POA: Diagnosis not present

## 2017-10-17 DIAGNOSIS — I482 Chronic atrial fibrillation, unspecified: Secondary | ICD-10-CM

## 2017-10-17 DIAGNOSIS — I480 Paroxysmal atrial fibrillation: Secondary | ICD-10-CM | POA: Diagnosis not present

## 2017-10-17 LAB — POCT INR: INR: 1.9 — AB (ref 2.0–3.0)

## 2017-10-17 NOTE — Patient Instructions (Signed)
Description   Spoke with pt's wife and instructed to have pt continue taking 1/2 tablet daily except 1 tablet on Mondays, Thursdays, and Saturdays. Recheck in 2 weeks-Self-tester. Call with any concerns new medication or if scheduled for any procedures [phone]

## 2017-10-29 ENCOUNTER — Emergency Department (HOSPITAL_COMMUNITY): Payer: Medicare Other

## 2017-10-29 ENCOUNTER — Encounter (HOSPITAL_COMMUNITY): Payer: Self-pay

## 2017-10-29 ENCOUNTER — Other Ambulatory Visit: Payer: Self-pay

## 2017-10-29 ENCOUNTER — Inpatient Hospital Stay (HOSPITAL_COMMUNITY)
Admission: EM | Admit: 2017-10-29 | Discharge: 2017-11-03 | DRG: 698 | Disposition: A | Discharge status: Home Health | Payer: Medicare Other | Attending: Internal Medicine | Admitting: Internal Medicine

## 2017-10-29 DIAGNOSIS — R112 Nausea with vomiting, unspecified: Secondary | ICD-10-CM | POA: Diagnosis not present

## 2017-10-29 DIAGNOSIS — Z961 Presence of intraocular lens: Secondary | ICD-10-CM | POA: Diagnosis present

## 2017-10-29 DIAGNOSIS — Z993 Dependence on wheelchair: Secondary | ICD-10-CM | POA: Diagnosis not present

## 2017-10-29 DIAGNOSIS — R531 Weakness: Secondary | ICD-10-CM | POA: Diagnosis not present

## 2017-10-29 DIAGNOSIS — K219 Gastro-esophageal reflux disease without esophagitis: Secondary | ICD-10-CM | POA: Diagnosis present

## 2017-10-29 DIAGNOSIS — T83511A Infection and inflammatory reaction due to indwelling urethral catheter, initial encounter: Principal | ICD-10-CM | POA: Diagnosis present

## 2017-10-29 DIAGNOSIS — M549 Dorsalgia, unspecified: Secondary | ICD-10-CM | POA: Diagnosis present

## 2017-10-29 DIAGNOSIS — I350 Nonrheumatic aortic (valve) stenosis: Secondary | ICD-10-CM | POA: Diagnosis present

## 2017-10-29 DIAGNOSIS — N183 Chronic kidney disease, stage 3 (moderate): Secondary | ICD-10-CM | POA: Diagnosis present

## 2017-10-29 DIAGNOSIS — Z7401 Bed confinement status: Secondary | ICD-10-CM

## 2017-10-29 DIAGNOSIS — I330 Acute and subacute infective endocarditis: Secondary | ICD-10-CM | POA: Diagnosis not present

## 2017-10-29 DIAGNOSIS — K5900 Constipation, unspecified: Secondary | ICD-10-CM | POA: Diagnosis present

## 2017-10-29 DIAGNOSIS — I38 Endocarditis, valve unspecified: Secondary | ICD-10-CM

## 2017-10-29 DIAGNOSIS — Z9841 Cataract extraction status, right eye: Secondary | ICD-10-CM

## 2017-10-29 DIAGNOSIS — M19072 Primary osteoarthritis, left ankle and foot: Secondary | ICD-10-CM | POA: Diagnosis not present

## 2017-10-29 DIAGNOSIS — N179 Acute kidney failure, unspecified: Secondary | ICD-10-CM | POA: Diagnosis not present

## 2017-10-29 DIAGNOSIS — Z888 Allergy status to other drugs, medicaments and biological substances status: Secondary | ICD-10-CM | POA: Diagnosis not present

## 2017-10-29 DIAGNOSIS — Z6827 Body mass index (BMI) 27.0-27.9, adult: Secondary | ICD-10-CM

## 2017-10-29 DIAGNOSIS — Z7901 Long term (current) use of anticoagulants: Secondary | ICD-10-CM

## 2017-10-29 DIAGNOSIS — E785 Hyperlipidemia, unspecified: Secondary | ICD-10-CM | POA: Diagnosis present

## 2017-10-29 DIAGNOSIS — Z923 Personal history of irradiation: Secondary | ICD-10-CM

## 2017-10-29 DIAGNOSIS — I5032 Chronic diastolic (congestive) heart failure: Secondary | ICD-10-CM | POA: Diagnosis present

## 2017-10-29 DIAGNOSIS — R0689 Other abnormalities of breathing: Secondary | ICD-10-CM | POA: Diagnosis not present

## 2017-10-29 DIAGNOSIS — C61 Malignant neoplasm of prostate: Secondary | ICD-10-CM | POA: Diagnosis not present

## 2017-10-29 DIAGNOSIS — R509 Fever, unspecified: Secondary | ICD-10-CM

## 2017-10-29 DIAGNOSIS — I351 Nonrheumatic aortic (valve) insufficiency: Secondary | ICD-10-CM | POA: Diagnosis not present

## 2017-10-29 DIAGNOSIS — C7951 Secondary malignant neoplasm of bone: Secondary | ICD-10-CM | POA: Diagnosis not present

## 2017-10-29 DIAGNOSIS — E669 Obesity, unspecified: Secondary | ICD-10-CM | POA: Diagnosis present

## 2017-10-29 DIAGNOSIS — R7881 Bacteremia: Secondary | ICD-10-CM | POA: Diagnosis not present

## 2017-10-29 DIAGNOSIS — B957 Other staphylococcus as the cause of diseases classified elsewhere: Secondary | ICD-10-CM | POA: Diagnosis not present

## 2017-10-29 DIAGNOSIS — Z87440 Personal history of urinary (tract) infections: Secondary | ICD-10-CM | POA: Diagnosis not present

## 2017-10-29 DIAGNOSIS — Z8249 Family history of ischemic heart disease and other diseases of the circulatory system: Secondary | ICD-10-CM

## 2017-10-29 DIAGNOSIS — G8929 Other chronic pain: Secondary | ICD-10-CM | POA: Diagnosis not present

## 2017-10-29 DIAGNOSIS — M19071 Primary osteoarthritis, right ankle and foot: Secondary | ICD-10-CM | POA: Diagnosis present

## 2017-10-29 DIAGNOSIS — Z85820 Personal history of malignant melanoma of skin: Secondary | ICD-10-CM

## 2017-10-29 DIAGNOSIS — I480 Paroxysmal atrial fibrillation: Secondary | ICD-10-CM | POA: Diagnosis present

## 2017-10-29 DIAGNOSIS — N390 Urinary tract infection, site not specified: Secondary | ICD-10-CM | POA: Diagnosis present

## 2017-10-29 DIAGNOSIS — W19XXXA Unspecified fall, initial encounter: Secondary | ICD-10-CM | POA: Diagnosis not present

## 2017-10-29 DIAGNOSIS — Y846 Urinary catheterization as the cause of abnormal reaction of the patient, or of later complication, without mention of misadventure at the time of the procedure: Secondary | ICD-10-CM | POA: Diagnosis present

## 2017-10-29 DIAGNOSIS — Z87891 Personal history of nicotine dependence: Secondary | ICD-10-CM

## 2017-10-29 DIAGNOSIS — A411 Sepsis due to other specified staphylococcus: Secondary | ICD-10-CM | POA: Diagnosis present

## 2017-10-29 DIAGNOSIS — I10 Essential (primary) hypertension: Secondary | ICD-10-CM | POA: Diagnosis not present

## 2017-10-29 DIAGNOSIS — Z8673 Personal history of transient ischemic attack (TIA), and cerebral infarction without residual deficits: Secondary | ICD-10-CM

## 2017-10-29 DIAGNOSIS — R42 Dizziness and giddiness: Secondary | ICD-10-CM | POA: Diagnosis not present

## 2017-10-29 DIAGNOSIS — Z79899 Other long term (current) drug therapy: Secondary | ICD-10-CM

## 2017-10-29 DIAGNOSIS — R000 Tachycardia, unspecified: Secondary | ICD-10-CM | POA: Diagnosis not present

## 2017-10-29 DIAGNOSIS — R0902 Hypoxemia: Secondary | ICD-10-CM | POA: Diagnosis not present

## 2017-10-29 DIAGNOSIS — G459 Transient cerebral ischemic attack, unspecified: Secondary | ICD-10-CM | POA: Diagnosis not present

## 2017-10-29 DIAGNOSIS — R29810 Facial weakness: Secondary | ICD-10-CM | POA: Diagnosis not present

## 2017-10-29 DIAGNOSIS — Z9842 Cataract extraction status, left eye: Secondary | ICD-10-CM

## 2017-10-29 DIAGNOSIS — I714 Abdominal aortic aneurysm, without rupture: Secondary | ICD-10-CM | POA: Diagnosis present

## 2017-10-29 DIAGNOSIS — R011 Cardiac murmur, unspecified: Secondary | ICD-10-CM | POA: Diagnosis not present

## 2017-10-29 DIAGNOSIS — R319 Hematuria, unspecified: Secondary | ICD-10-CM | POA: Diagnosis not present

## 2017-10-29 DIAGNOSIS — I130 Hypertensive heart and chronic kidney disease with heart failure and stage 1 through stage 4 chronic kidney disease, or unspecified chronic kidney disease: Secondary | ICD-10-CM | POA: Diagnosis not present

## 2017-10-29 DIAGNOSIS — Z95828 Presence of other vascular implants and grafts: Secondary | ICD-10-CM

## 2017-10-29 DIAGNOSIS — Z960 Presence of urogenital implants: Secondary | ICD-10-CM | POA: Diagnosis not present

## 2017-10-29 DIAGNOSIS — I358 Other nonrheumatic aortic valve disorders: Secondary | ICD-10-CM | POA: Diagnosis not present

## 2017-10-29 DIAGNOSIS — R404 Transient alteration of awareness: Secondary | ICD-10-CM | POA: Diagnosis not present

## 2017-10-29 LAB — CBC WITH DIFFERENTIAL/PLATELET
Abs Immature Granulocytes: 0.1 10*3/uL (ref 0.0–0.1)
BASOS PCT: 0 %
Basophils Absolute: 0 10*3/uL (ref 0.0–0.1)
EOS ABS: 0.1 10*3/uL (ref 0.0–0.7)
Eosinophils Relative: 1 %
HEMATOCRIT: 42.5 % (ref 39.0–52.0)
Hemoglobin: 13.8 g/dL (ref 13.0–17.0)
IMMATURE GRANULOCYTES: 0 %
LYMPHS ABS: 0.5 10*3/uL — AB (ref 0.7–4.0)
LYMPHS PCT: 3 %
MCH: 31.5 pg (ref 26.0–34.0)
MCHC: 32.5 g/dL (ref 30.0–36.0)
MCV: 97 fL (ref 78.0–100.0)
MONOS PCT: 7 %
Monocytes Absolute: 1 10*3/uL (ref 0.1–1.0)
NEUTROS ABS: 13.3 10*3/uL — AB (ref 1.7–7.7)
NEUTROS PCT: 89 %
PLATELETS: 151 10*3/uL (ref 150–400)
RBC: 4.38 MIL/uL (ref 4.22–5.81)
RDW: 14.4 % (ref 11.5–15.5)
WBC: 15 10*3/uL — ABNORMAL HIGH (ref 4.0–10.5)

## 2017-10-29 LAB — COMPREHENSIVE METABOLIC PANEL
ALT: 12 U/L (ref 0–44)
AST: 30 U/L (ref 15–41)
Albumin: 3.1 g/dL — ABNORMAL LOW (ref 3.5–5.0)
Alkaline Phosphatase: 55 U/L (ref 38–126)
Anion gap: 13 (ref 5–15)
BUN: 43 mg/dL — AB (ref 8–23)
CO2: 23 mmol/L (ref 22–32)
CREATININE: 1.36 mg/dL — AB (ref 0.61–1.24)
Calcium: 10.1 mg/dL (ref 8.9–10.3)
Chloride: 103 mmol/L (ref 98–111)
GFR calc Af Amer: 51 mL/min — ABNORMAL LOW (ref >60)
GFR calc non Af Amer: 44 mL/min — ABNORMAL LOW (ref >60)
GLUCOSE: 161 mg/dL — AB (ref 70–99)
Potassium: 4.8 mmol/L (ref 3.5–5.1)
Sodium: 139 mmol/L (ref 135–145)
TOTAL PROTEIN: 6.6 g/dL (ref 6.5–8.1)
Total Bilirubin: 1.5 mg/dL — ABNORMAL HIGH (ref 0.3–1.2)

## 2017-10-29 LAB — URINALYSIS, ROUTINE W REFLEX MICROSCOPIC
Bilirubin Urine: NEGATIVE
Glucose, UA: NEGATIVE mg/dL
KETONES UR: NEGATIVE mg/dL
Nitrite: NEGATIVE
PH: 5 (ref 5.0–8.0)
PROTEIN: NEGATIVE mg/dL
Specific Gravity, Urine: 1.015 (ref 1.005–1.030)

## 2017-10-29 LAB — I-STAT CG4 LACTIC ACID, ED
LACTIC ACID, VENOUS: 2.09 mmol/L — AB (ref 0.5–1.9)
Lactic Acid, Venous: 1.54 mmol/L (ref 0.5–1.9)

## 2017-10-29 LAB — PROTIME-INR
INR: 1.74
Prothrombin Time: 20.2 seconds — ABNORMAL HIGH (ref 11.4–15.2)

## 2017-10-29 MED ORDER — SODIUM CHLORIDE 0.9 % IV SOLN
2.0000 g | Freq: Once | INTRAVENOUS | Status: AC
  Administered 2017-10-29: 2 g via INTRAVENOUS
  Filled 2017-10-29: qty 2

## 2017-10-29 MED ORDER — ENZALUTAMIDE 40 MG PO CAPS
40.0000 mg | ORAL_CAPSULE | Freq: Every day | ORAL | Status: DC
  Administered 2017-10-30 – 2017-11-03 (×5): 40 mg via ORAL
  Filled 2017-10-29 (×5): qty 1

## 2017-10-29 MED ORDER — SODIUM CHLORIDE 0.9 % IV SOLN
2.0000 g | Freq: Two times a day (BID) | INTRAVENOUS | Status: DC
  Administered 2017-10-30: 2 g via INTRAVENOUS
  Filled 2017-10-29 (×2): qty 2

## 2017-10-29 MED ORDER — WARFARIN - PHYSICIAN DOSING INPATIENT: Freq: Every day | Status: DC

## 2017-10-29 MED ORDER — ACETAMINOPHEN 325 MG PO TABS
650.0000 mg | ORAL_TABLET | ORAL | Status: DC | PRN
  Administered 2017-11-01 – 2017-11-02 (×2): 650 mg via ORAL
  Filled 2017-10-29 (×3): qty 2

## 2017-10-29 MED ORDER — ACETAMINOPHEN 325 MG PO TABS
650.0000 mg | ORAL_TABLET | Freq: Once | ORAL | Status: AC
  Administered 2017-10-29: 650 mg via ORAL
  Filled 2017-10-29: qty 2

## 2017-10-29 MED ORDER — SODIUM CHLORIDE 0.9 % IV BOLUS (SEPSIS)
1000.0000 mL | Freq: Once | INTRAVENOUS | Status: AC
  Administered 2017-10-29: 1000 mL via INTRAVENOUS

## 2017-10-29 MED ORDER — SODIUM CHLORIDE 0.9 % IV SOLN: 1000.0000 mL | INTRAVENOUS | Status: DC

## 2017-10-29 MED ORDER — SODIUM CHLORIDE 0.9 % IV SOLN
1000.0000 mL | INTRAVENOUS | Status: DC
  Administered 2017-10-29: 1000 mL via INTRAVENOUS

## 2017-10-29 MED ORDER — POLYETHYLENE GLYCOL 3350 17 G PO PACK
17.0000 g | PACK | Freq: Every day | ORAL | Status: DC
  Administered 2017-10-30 – 2017-11-02 (×2): 17 g via ORAL
  Filled 2017-10-29 (×4): qty 1

## 2017-10-29 MED ORDER — DOCUSATE SODIUM 100 MG PO CAPS
200.0000 mg | ORAL_CAPSULE | Freq: Two times a day (BID) | ORAL | Status: DC
  Administered 2017-10-29 – 2017-11-03 (×10): 200 mg via ORAL
  Filled 2017-10-29 (×11): qty 2

## 2017-10-29 MED ORDER — BISACODYL 10 MG RE SUPP: 10.0000 mg | Freq: Every day | RECTAL | Status: DC | PRN

## 2017-10-29 MED ORDER — SODIUM CHLORIDE 0.9 % IV BOLUS (SEPSIS): 1000.0000 mL | Freq: Once | INTRAVENOUS | Status: DC

## 2017-10-29 MED ORDER — ONDANSETRON HCL 4 MG PO TABS: 4.0000 mg | ORAL_TABLET | Freq: Four times a day (QID) | ORAL | Status: DC | PRN

## 2017-10-29 MED ORDER — ONDANSETRON HCL 4 MG/2ML IJ SOLN
4.0000 mg | Freq: Four times a day (QID) | INTRAMUSCULAR | Status: DC | PRN
  Filled 2017-10-29: qty 2

## 2017-10-29 MED ORDER — WARFARIN SODIUM 5 MG PO TABS
5.0000 mg | ORAL_TABLET | Freq: Every day | ORAL | Status: DC
  Administered 2017-10-29 – 2017-10-30 (×2): 5 mg via ORAL
  Filled 2017-10-29 (×3): qty 1

## 2017-10-29 MED ORDER — ONDANSETRON HCL 4 MG/2ML IJ SOLN
4.0000 mg | Freq: Once | INTRAMUSCULAR | Status: AC
  Administered 2017-10-29: 4 mg via INTRAVENOUS
  Filled 2017-10-29: qty 2

## 2017-10-29 MED ORDER — FUROSEMIDE 40 MG PO TABS
40.0000 mg | ORAL_TABLET | Freq: Every day | ORAL | Status: DC
  Administered 2017-10-30 – 2017-11-03 (×5): 40 mg via ORAL
  Filled 2017-10-29 (×5): qty 1

## 2017-10-29 MED ORDER — OXYBUTYNIN CHLORIDE 5 MG PO TABS: 5.0000 mg | ORAL_TABLET | Freq: Every day | ORAL | Status: DC | PRN

## 2017-10-29 NOTE — ED Triage Notes (Signed)
Pt from home with ems for AMS and fever. EMS called code sepsis. Pt has been more lethargic than normal per his wife who is his primary caretaker and he has been confused. Pt tachycardic and tacypneic. Hr a fib ranging 120-160bpm, rr 26-30. Initially mid 34s on room air, 2L [place] applied and increased to mid 90s. BP 200/100 initially and is now 170/90. Pt has indwelling foley catheter due to prostate cancer and wife reports fever of 100.4 at home. Pt given tylenol this am but the pt vomited it back up.   Pt alert upon arrival, nad at this time.

## 2017-10-29 NOTE — ED Provider Notes (Signed)
[facility] EMERGENCY DEPARTMENT Provider Note   CSN: [id number] Arrival date & time: 10/29/17  1755     History   Chief Complaint Chief Complaint  Patient presents with  . Code Sepsis    HPI Craig [name]. is a 84 y.o. male.  HPI Patient presents to the emergency room for evaluation of weakness fever and vomiting.  Patient is a retired [employer].  He lives at home with his wife.  Patient has a history of metastatic prostate cancer.  Patient has an indwelling Foley catheter.  Today the patient seemed a bit weaker than usual.  He was more listless.  Today he started having some nausea and vomiting.  Initially she did not think too much of it and thought he may have developed a stomach bug.  Patient has not had any diarrhea.  He has vomited a few times.  He developed a low-grade temperature in the low 100s.  Not had any significant coughing.  No rashes.  Patient is not having any complaints of headache, chest pain or abdominal pain. Past Medical History:  Diagnosis Date  . Atrial fibrillation ([place])   . Chronic back pain   . GERD (gastroesophageal reflux disease)   . H/O hiatal hernia   . History of epistaxis 07/19/2002  . Hyperlipidemia   . Hypertension   . Hypertensive heart disease   . Hypokalemia   . ICH (intracerebral hemorrhage) ([place]) ~ 1999   after TPA/notes 09/27/2012  . Melanoma ([place])    "top of my head" (09/27/2012)  . Migraines    "migraines without headaches years ago" (09/27/2012)  . Osteoarthritis of both feet   . PAT (paroxysmal atrial tachycardia) ([place])   . Personal history of long-term (current) use of anticoagulants   . Pneumonia    "once; several years ago" (09/27/2012)  . Prostate cancer, primary, with metastasis from prostate to other site [facility])    on Lupron injections per GU  . S/P radiation therapy 02/13/14-02/27/14   SRS 5/5 spine completed 02/27/14  . S/P radiation therapy 05/27/14-05/31/14   right femur 20Gy/64fx  . Stroke [facility]) ~ 1999     ischemic / right cerebellar/posterior inferior cerebellar artery / right pos infarct    Patient Active Problem List   Diagnosis Date Noted  . Bone cancer ([place]) 12/01/2015  . History of recurrent TIAs 12/01/2015  . Chronic anticoagulation 12/01/2015  . Hearing difficulty 12/01/2015  . At high risk for complication of immobility 12/01/2015  . Aortic stenosis 07/15/2015  . Stroke ([place])   . Hemispheric carotid artery syndrome   . Cardiomyopathy, ischemic   . Stroke-like symptoms 12/31/2014  . Catheter-associated urinary tract infection ([place]) 12/31/2014  . Hemoptysis 07/12/2014  . Hypertensive urgency 07/12/2014  . Pulmonary embolism ([place])   . Cough with hemoptysis   . Pulmonary embolus ([place]) 07/11/2014  . Fever 04/22/2014  . Dysarthria 04/21/2014  . CVA (cerebral infarction) 04/19/2014  . Prostate cancer, primary, with metastasis from prostate to other site [facility]) 04/19/2014  . Chronic back pain 04/19/2014  . Expressive aphasia   . Bone metastasis ([place]) 02/12/2014  . Chronic diastolic heart failure ([place]) 02/11/2014  . Hypertensive heart disease 02/11/2014  . Essential hypertension 02/11/2014  . History of stroke 02/11/2014  . AAA (abdominal aortic aneurysm) ([place]) 02/11/2014  . Hypokalemia 02/11/2014  . Back pain 02/09/2014  . Fluid overload 02/08/2014  . Left-sided weakness 02/08/2014  . Encounter for therapeutic drug monitoring 05/14/2013  . Chronic kidney disease (CKD), stage  III (moderate) ([place]) 09/27/2012  . Elevated brain natriuretic peptide (BNP) level 09/27/2012  . Hypercalcemia 09/27/2012  . Weakness 09/27/2012  . Epistaxis 01/08/2011  . Cerebellar stroke syndrome 07/27/2010  . Hypercholesterolemia 07/27/2010  . Prostate cancer metastatic to bone ([place]) 07/27/2010  . Benign hypertensive heart disease without heart failure 07/27/2010  . Renal insufficiency 07/27/2010  . Chronic atrial fibrillation ([place]) 05/25/2010    Past Surgical History:  Procedure Laterality  Date  . CATARACT EXTRACTION W/ INTRAOCULAR LENS  IMPLANT, BILATERAL Bilateral ~ 2011  . MELANOMA EXCISION  2010   "pre-melanoma on top of head; did skin graft from left thigh to cover" (09/27/2012)  . NASAL SINUS SURGERY  1998  . SKIN GRAFT Right 2010  . TONSILLECTOMY  ~ [place] Medications    Prior to Admission medications   Medication Sig Start Date End Date Taking? Authorizing Provider  acetaminophen (TYLENOL) 500 MG tablet Take 1,000 mg by mouth daily.     [provider]  amLODipine (NORVASC) 10 MG tablet Take 1 tablet (10 mg total) by mouth daily. 08/11/17   Jettie Booze, MD  atorvastatin (LIPITOR) 10 MG tablet Take 1 tablet (10 mg total) by mouth daily. 08/11/17   Jettie Booze, MD  B Complex-C (B-COMPLEX WITH VITAMIN C) tablet Take 1 tablet by mouth daily.      [provider]  Denosumab (XGEVA [place]) Inject into the skin every 30 (thirty) days. Monthly. Last inj was 8-19- 2016. Wife not sure what dose it is    [provider]  docusate sodium (COLACE) 100 MG capsule Take 100 mg by mouth 2 (two) times daily. 09/10/14   [provider]  ezetimibe (ZETIA) 10 MG tablet Take 1 tablet (10 mg total) by mouth daily. 08/11/17   Jettie Booze, MD  furosemide (LASIX) 40 MG tablet Take 1 tablet (40 mg total) by mouth daily. 08/11/17   Jettie Booze, MD  Leuprolide Acetate (LUPRON DEPOT IM) Inject 1 each into the muscle every 6 (six) months. Last inj was in March 2016    [provider]  losartan (COZAAR) 100 MG tablet Take 1 tablet (100 mg total) by mouth daily. 08/11/17   Jettie Booze, MD  multivitamin [facility]) per tablet Take 1 tablet by mouth daily. ( with B - Complex )    [provider]  Omega-3 Fatty Acids (FISH OIL PO) Take 1 capsule by mouth daily. ( Mega Red )    [provider]  oxybutynin (DITROPAN) 5 MG tablet as needed.  10/15/15   [provider]  Probiotic Product  (PROBIOTIC ADVANCED PO) Take 1 capsule by mouth daily.    [provider]  ranitidine (ZANTAC) 150 MG tablet Take 150 mg by mouth at bedtime.    [provider]  warfarin (COUMADIN) 5 MG tablet TAKE 1 TABLET BY MOUTH AS DIRECTED BY THE COUMADIN CLINIC 08/15/17   Jettie Booze, MD  XTANDI 40 MG capsule TAKE 1 CAPSULE (40 MG) BY MOUTH ONCE DAILY 10/14/17   Wyatt Portela, MD    Family History Family History  Problem Relation Age of Onset  . Heart failure Mother   . Heart attack Father     Social History Social History   Tobacco Use  . Smoking status: Former Smoker    Years: 0.50    Types: Cigarettes    Last attempt to quit: 02/22/1953    Years since quitting:  64.7  . Smokeless tobacco: Never Used  Substance Use Topics  . Alcohol use: No    Comment: 09/27/2012 "glass of wine or 2/month"  . Drug use: No     Allergies   Pravachol and Zocor [simvastatin - high dose]   Review of Systems Review of Systems  All other systems reviewed and are negative.    Physical Exam Updated Vital Signs BP (!) 147/77   Pulse (!) 101   Temp (!) 100.9 F (38.3 C) (Oral)   Resp (!) 24   SpO2 93%   Physical Exam  HENT:  Head: Normocephalic and atraumatic.  Right Ear: External ear normal.  Left Ear: External ear normal.  Eyes: Conjunctivae are normal. Right eye exhibits no discharge. Left eye exhibits no discharge. No scleral icterus.  Neck: Neck supple. No tracheal deviation present.  Cardiovascular: Intact distal pulses. An irregularly irregular rhythm present. Tachycardia present.  Strong pulses in the extremities  Pulmonary/Chest: Effort normal and breath sounds normal. No stridor. No respiratory distress. He has no wheezes. He has no rales.  Abdominal: Soft. Bowel sounds are normal. He exhibits no distension. There is no tenderness. There is no rebound and no guarding.  Genitourinary:  Genitourinary Comments: Indwelling Foley catheter with yellow urine    Musculoskeletal: He exhibits no edema or tenderness.  Neurological: He is alert. No cranial nerve deficit (no facial droop, extraocular movements intact, no slurred speech) or sensory deficit. He exhibits normal muscle tone. He displays no seizure activity. Coordination normal.  Neurolyse weakness however the patient commands and answers questions appropriately  Skin: Skin is warm and dry. No rash noted. He is not diaphoretic.  Psychiatric: He has a normal mood and affect.  Nursing note and vitals reviewed.    ED Treatments / Results  Labs (all labs ordered are listed, but only abnormal results are displayed) Labs Reviewed  COMPREHENSIVE METABOLIC PANEL - Abnormal; Notable for the following components:      Result Value   Glucose, Bld 161 (*)    BUN 43 (*)    Creatinine, Ser 1.36 (*)    Albumin 3.1 (*)    Total Bilirubin 1.5 (*)    GFR calc non Af Amer 44 (*)    GFR calc Af Amer 51 (*)    All other components within normal limits  CBC WITH DIFFERENTIAL/PLATELET - Abnormal; Notable for the following components:   WBC 15.0 (*)    Neutro Abs 13.3 (*)    Lymphs Abs 0.5 (*)    All other components within normal limits  PROTIME-INR - Abnormal; Notable for the following components:   Prothrombin Time 20.2 (*)    All other components within normal limits  URINALYSIS, ROUTINE W REFLEX MICROSCOPIC - Abnormal; Notable for the following components:   Color, Urine AMBER (*)    APPearance HAZY (*)    Hgb urine dipstick LARGE (*)    Leukocytes, UA MODERATE (*)    Bacteria, UA RARE (*)    All other components within normal limits  I-STAT CG4 LACTIC ACID, ED - Abnormal; Notable for the following components:   Lactic Acid, Venous 2.09 (*)    All other components within normal limits  CULTURE, BLOOD (ROUTINE X 2)  CULTURE, BLOOD (ROUTINE X 2)  URINE CULTURE  I-STAT CG4 LACTIC ACID, ED    EKG EKG Interpretation  Date/Time:  Saturday October 29 2017 18:35:24 EDT Ventricular Rate:   101 PR Interval:    QRS Duration: 98 QT Interval:  344  QTC Calculation: 446 R Axis:   -59 Text Interpretation:  Atrial fibrillation Left anterior fascicular block Probable anterior infarct, age indeterminate No significant change since last tracing Confirmed by Dorie Rank [phone]) on 10/29/2017 6:42:02 PM   Radiology Dg Chest Port 1 View  Result Date: 10/29/2017 CLINICAL DATA:  Altered mental status and fever. Ex-smoker. EXAM: PORTABLE CHEST 1 VIEW COMPARISON:  07/17/2014. FINDINGS: Stable enlarged cardiac silhouette and tortuous and calcified thoracic aorta. The interstitial markings remain mildly prominent. Stable small amount of linear scarring in the right mid lung zone. Thoracic spine degenerative changes. IMPRESSION: Stable cardiomegaly and mild chronic interstitial lung disease. No acute abnormality. Electronically Signed   By: Claudie Revering M.D.   On: 10/29/2017 19:46    Procedures .Critical Care Performed by: Dorie Rank, MD Authorized by: Dorie Rank, MD   Critical care provider statement:    Critical care time (minutes):  45   Critical care was time spent personally by me on the following activities:  Discussions with consultants, evaluation of patient's response to treatment, examination of patient, ordering and performing treatments and interventions, ordering and review of laboratory studies, ordering and review of radiographic studies, pulse oximetry, re-evaluation of patient's condition, obtaining history from patient or surrogate and review of old charts   (including critical care time)  Medications Ordered in ED Medications  sodium chloride 0.9 % bolus 1,000 mL (0 mLs Intravenous Stopped 10/29/17 2052)    Followed by  0.9 %  sodium chloride infusion (has no administration in time range)  ceFEPIme (MAXIPIME) 2 g in sodium chloride 0.9 % 100 mL IVPB (has no administration in time range)  ondansetron (ZOFRAN) injection 4 mg (4 mg Intravenous Given 10/29/17 1959)  acetaminophen  (TYLENOL) tablet 650 mg (650 mg Oral Given 10/29/17 1959)     Initial Impression / Assessment and Plan / ED Course  I have reviewed the triage vital signs and the nursing notes.  Pertinent labs & imaging results that were available during my care of the patient were reviewed by me and considered in my medical decision making (see chart for details).   Patient presented to the emergency room for evaluation of and confusion.  Patient had a temperature of 100.9 here in the emergency room.  He also had a mild tachycardia associated with his atrial fibrillation and fever.  Patient's ED work-up was notable for a leukocytosis.  He had a slight elevated lactic acid level initially that improved with hydration.  He does have evidence of mild acute kidney injury.  He does have an elevated bilirubin which is new.  His LFTs are otherwise normal.  I am uncertain of the significance of that at this time.  Urinalysis does suggest a urinary tract infection.  Considering his advanced age, fever and elevated white blood cell count I have started him on IV antibiotics and I will consult the medical service for admission  Final Clinical Impressions(s) / ED Diagnoses   Final diagnoses:  Fever, unspecified fever cause  Urinary tract infection associated with indwelling urethral catheter, initial encounter [facility])      Dorie Rank, MD 10/29/17 2116

## 2017-10-29 NOTE — H&P (Signed)
History and Physical  Craig [name]. XAJ:[id number] DOB: [birth year] DOA: 10/29/2017 1755   PCP: Mellody Dance, DO   HISTORY   Chief Complaint: nausea and lethargy  HPI: Craig Burling. 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 Wife (primary caretaker) activated EMS -- noted initial BP in 200s/100s with tachycardia to 120-160s. Low grade fever up to 100.4 at home. Upon arrival, patient denies any active complaints. Wife had not noted any malodorous urine or change in color. Recalls he has had about 3-4 admissions for UTI in the past 4 years since he has had the foley. At baseline wheelchair bound. Wife also reports that patient had not had a bowel movement in 3 days (usually has BM every 2-3 days).   Review of Systems:  + low grade fever, nausea and vomiting, lethargy and decreased appetite x 1 day - no cough - no chest pain, dyspnea on exertion + chronic baseline pedal edema unchanged - no increase edema, PND, orthopnea - no tarry, melanotic or bloody stools + chronic foley - no weight changes  Rest of systems reviewed are negative, except as per above history.   ED course:  Vitals Blood pressure 122/68, pulse 86, temperature (!) 100.9 F (38.3 C), temperature source Oral, resp. rate (!) 29, SpO2 96 %. Received cefepime IV 2gm; NS bolus x 1 and maintenance NS at 125 cc/hr ~ 3 hours  Past Medical History:  Diagnosis Date  . Atrial fibrillation ([place])   . Chronic back pain   . GERD (gastroesophageal reflux disease)   . H/O hiatal hernia   . History of epistaxis 07/19/2002  . Hyperlipidemia   . Hypertension   . Hypertensive heart disease   . Hypokalemia   . ICH (intracerebral hemorrhage) ([place]) ~ 1999   after TPA/notes 09/27/2012  . Melanoma ([place])    "top of my head" (09/27/2012)    . Migraines    "migraines without headaches years ago" (09/27/2012)  . Osteoarthritis of both feet   . PAT (paroxysmal atrial tachycardia) ([place])   . Personal history of long-term (current) use of anticoagulants   . Pneumonia    "once; several years ago" (09/27/2012)  . Prostate cancer, primary, with metastasis from prostate to other site [facility])    on Lupron injections per GU  . S/P radiation therapy 02/13/14-02/27/14   SRS 5/5 spine completed 02/27/14  . S/P radiation therapy 05/27/14-05/31/14   right femur 20Gy/39fx  . Stroke [facility]) ~ 1999   ischemic / right cerebellar/posterior inferior cerebellar artery / right pos infarct   Past Surgical History:  Procedure Laterality Date  . CATARACT EXTRACTION W/ INTRAOCULAR LENS  IMPLANT, BILATERAL Bilateral ~ 2011  . MELANOMA EXCISION  2010   "pre-melanoma on top of head; did skin graft from left thigh to cover" (09/27/2012)  . NASAL SINUS SURGERY  1998  . SKIN GRAFT Right 2010  . TONSILLECTOMY  ~ [place]    Social History:  reports that he quit smoking about 64 years ago. His smoking use included cigarettes. He quit after 0.50 years of use. He has never used smokeless tobacco. He reports that he does not drink alcohol or use drugs.  Allergies  Allergen Reactions  . Pravachol Other (See Comments)    Muscle weakness  . Zocor [Simvastatin - High Dose] Other (See Comments)  Muscle weakness    Family History  Problem Relation Age of Onset  . Heart failure Mother   . Heart attack Father       Prior to Admission medications   Medication Sig Start Date End Date Taking? Authorizing Provider  acetaminophen (TYLENOL) 500 MG tablet Take 1,000 mg by mouth daily.     [provider]  amLODipine (NORVASC) 10 MG tablet Take 1 tablet (10 mg total) by mouth daily. 08/11/17   Jettie Booze, MD  atorvastatin (LIPITOR) 10 MG tablet Take 1 tablet (10 mg total) by mouth daily. 08/11/17   Jettie Booze, MD  B Complex-C (B-COMPLEX WITH VITAMIN C)  tablet Take 1 tablet by mouth daily.      [provider]  Denosumab (XGEVA [place]) Inject into the skin every 30 (thirty) days. Monthly. Last inj was 8-19- 2016. Wife not sure what dose it is    [provider]  docusate sodium (COLACE) 100 MG capsule Take 100 mg by mouth 2 (two) times daily. 09/10/14   [provider]  ezetimibe (ZETIA) 10 MG tablet Take 1 tablet (10 mg total) by mouth daily. 08/11/17   Jettie Booze, MD  furosemide (LASIX) 40 MG tablet Take 1 tablet (40 mg total) by mouth daily. 08/11/17   Jettie Booze, MD  Leuprolide Acetate (LUPRON DEPOT IM) Inject 1 each into the muscle every 6 (six) months. Last inj was in March 2016    [provider]  losartan (COZAAR) 100 MG tablet Take 1 tablet (100 mg total) by mouth daily. 08/11/17   Jettie Booze, MD  multivitamin [facility]) per tablet Take 1 tablet by mouth daily. ( with B - Complex )    [provider]  Omega-3 Fatty Acids (FISH OIL PO) Take 1 capsule by mouth daily. ( Mega Red )    [provider]  oxybutynin (DITROPAN) 5 MG tablet as needed.  10/15/15   [provider]  Probiotic Product (PROBIOTIC ADVANCED PO) Take 1 capsule by mouth daily.    [provider]  ranitidine (ZANTAC) 150 MG tablet Take 150 mg by mouth at bedtime.    [provider]  warfarin (COUMADIN) 5 MG tablet TAKE 1 TABLET BY MOUTH AS DIRECTED BY THE COUMADIN CLINIC 08/15/17   Jettie Booze, MD  XTANDI 40 MG capsule TAKE 1 CAPSULE (40 MG) BY MOUTH ONCE DAILY 10/14/17   Wyatt Portela, MD    PHYSICAL EXAM   Temp:  [100.2 F (37.9 C)-100.9 F (38.3 C)] 100.9 F (38.3 C) (09/07 1921) Pulse Rate:  [86-114] 86 (09/07 2115) Cardiac Rhythm: Atrial fibrillation (09/07 1925) Resp:  [16-35] 29 (09/07 2115) BP: (122-166)/(63-112) 122/68 (09/07 2115) SpO2:  [84 %-97 %] 96 % (09/07 2115) FiO2 (%):  [2 %] 2 % (09/07 1756)  BP 122/68   Pulse 86   Temp (!) 100.9 F  (38.3 C) (Oral)   Resp (!) 29   SpO2 96%    GEN obese elderly caucasian male; frail appearing, mildly uncomfortable but otherwise resting in bed  HEENT NCAT EOM PERRL; clear oropharynx, no cervical LAD; moist mucus membranes  JVP estimated 7-8 cm H2O above RA; no HJR ; no carotid bruits b/l ;  CV regular normal rate; 2/6 systolic murmur at LUSB; normal S1 and S2; no m/r/g or S3/S4; PMI nondispalced; no parasternal heave  RESP CTA b/l; diminished at bases b/l; breathing unlabored and symmetric  ABD soft NT ND +normoactive BS  EXT warm throughout b/l; mild LLE edema; compression socks in place PULSES  DP and radials 2+ b/l  SKIN/MSK no rashes or lesiosn  NEURO/PSYCH AAOx3;  R facial droop (chronic); no new focal deficits noted by wife   DATA   LABS ON ADMISSION:  Basic Metabolic Panel: Recent Labs  Lab 10/29/17 1819  NA 139  K 4.8  CL 103  CO2 23  GLUCOSE 161*  BUN 43*  CREATININE 1.36*  CALCIUM 10.1   CBC: Recent Labs  Lab 10/29/17 1819  WBC 15.0*  NEUTROABS 13.3*  HGB 13.8  HCT 42.5  MCV 97.0  PLT 151   Liver Function Tests: Recent Labs  Lab 10/29/17 1819  AST 30  ALT 12  ALKPHOS 55  BILITOT 1.5*  PROT 6.6  ALBUMIN 3.1*   No results for input(s): LIPASE, AMYLASE in the last 168 hours. No results for input(s): AMMONIA in the last 168 hours. Coagulation:  Lab Results  Component Value Date   INR 1.74 10/29/2017   INR 1.9 (A) 10/17/2017   INR 1.8 (A) 10/03/2017   No results found for: PTT Lactic Acid, Venous:     Component Value Date/Time   LATICACIDVEN 1.54 10/29/2017 2046   Cardiac Enzymes: No results for input(s): CKTOTAL, CKMB, CKMBINDEX, TROPONINI in the last 168 hours. Urinalysis:    Component Value Date/Time   COLORURINE AMBER (A) 10/29/2017 1803   APPEARANCEUR HAZY (A) 10/29/2017 1803   LABSPEC 1.015 10/29/2017 1803   PHURINE 5.0 10/29/2017 1803   GLUCOSEU NEGATIVE 10/29/2017 1803   HGBUR LARGE (A) 10/29/2017 1803   BILIRUBINUR  NEGATIVE 10/29/2017 1803   KETONESUR NEGATIVE 10/29/2017 1803   PROTEINUR NEGATIVE 10/29/2017 1803   UROBILINOGEN 0.2 12/31/2014 1500   NITRITE NEGATIVE 10/29/2017 1803   LEUKOCYTESUR MODERATE (A) 10/29/2017 1803    BNP (last 3 results) No results for input(s): PROBNP in the last 8760 hours. CBG: No results for input(s): GLUCAP in the last 168 hours.  Radiological Exams on Admission: Dg Chest Port 1 View  Result Date: 10/29/2017 CLINICAL DATA:  Altered mental status and fever. Ex-smoker. EXAM: PORTABLE CHEST 1 VIEW COMPARISON:  07/17/2014. FINDINGS: Stable enlarged cardiac silhouette and tortuous and calcified thoracic aorta. The interstitial markings remain mildly prominent. Stable small amount of linear scarring in the right mid lung zone. Thoracic spine degenerative changes. IMPRESSION: Stable cardiomegaly and mild chronic interstitial lung disease. No acute abnormality. Electronically Signed   By: Claudie Revering M.D.   On: 10/29/2017 19:46    EKG: Independently reviewed. afib at 101 bpm, LAFB (no new changes)    ASSESSMENT AND PLAN   Assessment: Craig Roker. is a 84 y.o. male with hx of metastatic prostate CA and chronic foley catheter who presents with low grade temperature and lethargy + nausea/vomiting. Low grade temperature and dirty UA. Elevated WBC to 15. Treating for presumed UTI. His medical hx is also significant for afib on coumadin and prior stroke and IC (see above history for full list). Would be cautious in fluid resuscitation given risk of volume overload. EF is preserved on last TTE. Lactate went from 2 --> 1.54 with fluids. No acute abnormalities on CXR.   Active Problems:   UTI (urinary tract infection)   Plan:   # Presumed UTI in setting of chronic foley.   > positive UA + leukest and Hb; lactate now 1.54 - continue IV cefepime 2gm q12h given pseudomonas risk factors - urine cultures pending - stopping fluid resuscitation given stable BP -  continue  home oxybutynin - prn tylenol for pain and zofran prn for nausea  # Hx of stroke and prior dysphagia  > per wife (caretaker), patient's diet has been liberalized to regular for pleasure feeds - aspiration precautions - regular diet for now but low threshold for SLP eval or changing diet status  # Hx of diastolic CHF > on maintenance PO lasix - continue lasix po 40mg  daily tomorrow - strict ins/outs and daily weights - monitor for vol status in AM; may need spot IV dose lasix  # Metastatic prostate CA - continue home xtandi 40mg  daily  # Hx of constipation and concern for worsening constipation recently - bowel regimen - added miralax regimen  # Hx of paroxysmal afib - on coumadin at home. Not on rate control > INR 1.74 on admission  - continue couamdin at 5 mg nightly - allow permissive tachycardia to 110-120s  - can follow up INR at home   DVT Prophylaxis: on coumadin Code Status:  Full Code (discussed at length with patient and wife; patient wishes full code at this time) Family Communication: wife at bedside Disposition Plan: admit to floor for UTI treatment  Patient contact: Extended Emergency Contact Information Primary Emergency Contact: Craig [name],Craig [name] Address: [address]          [place], [place] [address] [place] Phone: [phone] Mobile Phone: [phone] Relation: Spouse  Time spent:  > 35 minutes  Colbert Ewing, MD [facility] Pager [phone]  If 7PM-7AM, please contact night-coverage [email or web address] Password [email or web address] 10/29/2017, 9:25 PM

## 2017-10-30 ENCOUNTER — Encounter (HOSPITAL_COMMUNITY): Payer: Self-pay | Admitting: *Deleted

## 2017-10-30 DIAGNOSIS — I5032 Chronic diastolic (congestive) heart failure: Secondary | ICD-10-CM

## 2017-10-30 DIAGNOSIS — K5900 Constipation, unspecified: Secondary | ICD-10-CM

## 2017-10-30 DIAGNOSIS — I10 Essential (primary) hypertension: Secondary | ICD-10-CM

## 2017-10-30 LAB — CBC WITH DIFFERENTIAL/PLATELET
Abs Immature Granulocytes: 0.1 10*3/uL (ref 0.0–0.1)
BASOS ABS: 0 10*3/uL (ref 0.0–0.1)
Basophils Relative: 0 %
EOS ABS: 0 10*3/uL (ref 0.0–0.7)
EOS PCT: 0 %
HCT: 35.2 % — ABNORMAL LOW (ref 39.0–52.0)
HEMOGLOBIN: 11.2 g/dL — AB (ref 13.0–17.0)
Immature Granulocytes: 1 %
LYMPHS PCT: 11 %
Lymphs Abs: 1.5 10*3/uL (ref 0.7–4.0)
MCH: 31.3 pg (ref 26.0–34.0)
MCHC: 31.8 g/dL (ref 30.0–36.0)
MCV: 98.3 fL (ref 78.0–100.0)
Monocytes Absolute: 1.4 10*3/uL — ABNORMAL HIGH (ref 0.1–1.0)
Monocytes Relative: 10 %
Neutro Abs: 10.9 10*3/uL — ABNORMAL HIGH (ref 1.7–7.7)
Neutrophils Relative %: 78 %
Platelets: 128 10*3/uL — ABNORMAL LOW (ref 150–400)
RBC: 3.58 MIL/uL — ABNORMAL LOW (ref 4.22–5.81)
RDW: 14.6 % (ref 11.5–15.5)
WBC: 14 10*3/uL — ABNORMAL HIGH (ref 4.0–10.5)

## 2017-10-30 LAB — BLOOD CULTURE ID PANEL (REFLEXED)
Acinetobacter baumannii: NOT DETECTED
CANDIDA KRUSEI: NOT DETECTED
CANDIDA PARAPSILOSIS: NOT DETECTED
CANDIDA TROPICALIS: NOT DETECTED
Candida albicans: NOT DETECTED
Candida glabrata: NOT DETECTED
ESCHERICHIA COLI: NOT DETECTED
Enterobacter cloacae complex: NOT DETECTED
Enterobacteriaceae species: NOT DETECTED
Enterococcus species: NOT DETECTED
HAEMOPHILUS INFLUENZAE: NOT DETECTED
KLEBSIELLA OXYTOCA: NOT DETECTED
KLEBSIELLA PNEUMONIAE: NOT DETECTED
Listeria monocytogenes: NOT DETECTED
METHICILLIN RESISTANCE: NOT DETECTED
Neisseria meningitidis: NOT DETECTED
PROTEUS SPECIES: NOT DETECTED
PSEUDOMONAS AERUGINOSA: NOT DETECTED
SERRATIA MARCESCENS: NOT DETECTED
STAPHYLOCOCCUS AUREUS BCID: NOT DETECTED
STAPHYLOCOCCUS SPECIES: DETECTED — AB
STREPTOCOCCUS PNEUMONIAE: NOT DETECTED
Streptococcus agalactiae: NOT DETECTED
Streptococcus pyogenes: NOT DETECTED
Streptococcus species: NOT DETECTED

## 2017-10-30 LAB — BASIC METABOLIC PANEL
Anion gap: 7 (ref 5–15)
BUN: 46 mg/dL — ABNORMAL HIGH (ref 8–23)
CHLORIDE: 107 mmol/L (ref 98–111)
CO2: 26 mmol/L (ref 22–32)
CREATININE: 1.38 mg/dL — AB (ref 0.61–1.24)
Calcium: 9.3 mg/dL (ref 8.9–10.3)
GFR calc Af Amer: 50 mL/min — ABNORMAL LOW (ref >60)
GFR calc non Af Amer: 43 mL/min — ABNORMAL LOW (ref >60)
Glucose, Bld: 116 mg/dL — ABNORMAL HIGH (ref 70–99)
Potassium: 4 mmol/L (ref 3.5–5.1)
Sodium: 140 mmol/L (ref 135–145)

## 2017-10-30 LAB — MAGNESIUM: Magnesium: 1.9 mg/dL (ref 1.7–2.4)

## 2017-10-30 LAB — PROTIME-INR
INR: 1.84
Prothrombin Time: 21.1 seconds — ABNORMAL HIGH (ref 11.4–15.2)

## 2017-10-30 MED ORDER — SODIUM CHLORIDE 0.9 % IV SOLN
2.0000 g | INTRAVENOUS | Status: DC
  Administered 2017-10-31 – 2017-11-01 (×2): 2 g via INTRAVENOUS
  Filled 2017-10-30 (×2): qty 2

## 2017-10-30 NOTE — Progress Notes (Signed)
PHARMACY - PHYSICIAN COMMUNICATION CRITICAL VALUE ALERT - BLOOD CULTURE IDENTIFICATION (BCID)  Craig [name]. is an 84 y.o. male who presented to [facility] on 10/29/2017  Assessment: Patient with chronic foley -now with UTI. Blood cultures resulted as 2/2 with MSSE.  Name of physician (or Provider) Contacted: [place] Pink  Current antibiotics: Cefepime  Changes to prescribed antibiotics recommended:  Recommendations accepted by provider   Discussed re-drawing blood cultures. Clinical picture and blood cultures do not correlate; possible contaminant in both sets.   Results for orders placed or performed during the hospital encounter of 10/29/17  Blood Culture ID Panel (Reflexed) (Collected: 10/29/2017  6:19 PM)  Result Value Ref Range   Enterococcus species NOT DETECTED NOT DETECTED   Listeria monocytogenes NOT DETECTED NOT DETECTED   Staphylococcus species DETECTED (A) NOT DETECTED   Staphylococcus aureus NOT DETECTED NOT DETECTED   Methicillin resistance NOT DETECTED NOT DETECTED   Streptococcus species NOT DETECTED NOT DETECTED   Streptococcus agalactiae NOT DETECTED NOT DETECTED   Streptococcus pneumoniae NOT DETECTED NOT DETECTED   Streptococcus pyogenes NOT DETECTED NOT DETECTED   Acinetobacter baumannii NOT DETECTED NOT DETECTED   Enterobacteriaceae species NOT DETECTED NOT DETECTED   Enterobacter cloacae complex NOT DETECTED NOT DETECTED   Escherichia coli NOT DETECTED NOT DETECTED   Klebsiella oxytoca NOT DETECTED NOT DETECTED   Klebsiella pneumoniae NOT DETECTED NOT DETECTED   Proteus species NOT DETECTED NOT DETECTED   Serratia marcescens NOT DETECTED NOT DETECTED   Haemophilus influenzae NOT DETECTED NOT DETECTED   Neisseria meningitidis NOT DETECTED NOT DETECTED   Pseudomonas aeruginosa NOT DETECTED NOT DETECTED   Candida albicans NOT DETECTED NOT DETECTED   Candida glabrata NOT DETECTED NOT DETECTED   Candida krusei NOT DETECTED NOT DETECTED   Candida  parapsilosis NOT DETECTED NOT DETECTED   Candida tropicalis NOT DETECTED NOT DETECTED    Harvel Quale 10/30/2017  2:03 PM

## 2017-10-30 NOTE — Progress Notes (Addendum)
[facility] PROGRESS NOTE  Craig [name]. 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 Wife (primary caretaker) activated EMS -- noted initial BP in 200s/100s with tachycardia to 120-160s. Low grade fever up to 100.4 at home. He has had about 3-4 admissions for UTI in the past 4 years since he has had the foley. Wife also reports that patient had not had a bowel movement in 3 days (usually has BM every 2-3 days).   Reason for Visit: Complicated UTI in the setting of indwelling Foley catheter  Consultants: None  Procedures: Replacement of Foley catheter  Antibiotics: Cefepime  Subjective/Interval History: States that he feels better this morning.  Denies any abdominal pain nausea or vomiting.  His wife is at the bedside.  She also states that he is looking slightly better.  ROS: Denies any headaches.  No shortness of breath.  Objective:  Vital Signs  Vitals:   10/29/17 2249 10/29/17 2316 10/30/17 0500 10/30/17 0518  BP:  (!) 123/52  (!) 130/56  Pulse: 76 70  (!) 44  Resp: (!) 26 16  16   Temp: 97.8 F (36.6 C) 98.1 F (36.7 C)  98.5 F (36.9 C)  TempSrc: Oral Oral  Oral  SpO2: 98% 92%  99%  Weight:  99.3 kg 90.1 kg   Height:  6' (1.829 m)      Intake/Output Summary (Last 24 hours) at 10/30/2017 1203 Last data filed at 10/30/2017 9373 Gross per 24 hour  Intake 1605.61 ml  Output 300 ml  Net 1305.61 ml   Filed Weights   10/29/17 2316 10/30/17 0500  Weight: 99.3 kg 90.1 kg    General appearance: alert, cooperative, appears stated age and no distress Resp: clear to auscultation bilaterally Cardio: regular rate and rhythm, S1, S2 normal, no murmur, click, rub or gallop GI: soft,  non-tender; bowel sounds normal; no masses,  no organomegaly Extremities: extremities normal, atraumatic, no cyanosis or edema Pulses: 2+ and symmetric Neurologic: Awake alert.  Mildly distracted.  However no focal neurological deficits.  Lab Results:  Data Reviewed: I have personally reviewed following labs and imaging studies  CBC: Recent Labs  Lab 10/29/17 1819 10/30/17 0541  WBC 15.0* 14.0*  NEUTROABS 13.3* 10.9*  HGB 13.8 11.2*  HCT 42.5 35.2*  MCV 97.0 98.3  PLT 151 128*    Basic Metabolic Panel: Recent Labs  Lab 10/29/17 1819 10/30/17 0541  NA 139 140  K 4.8 4.0  CL 103 107  CO2 23 26  GLUCOSE 161* 116*  BUN 43* 46*  CREATININE 1.36* 1.38*  CALCIUM 10.1 9.3  MG  --  1.9    GFR: Estimated Creatinine Clearance: 39 mL/min (A) (by C-G formula based on SCr of 1.38 mg/dL (H)).  Liver Function Tests: Recent Labs  Lab 10/29/17 1819  AST 30  ALT 12  ALKPHOS 55  BILITOT 1.5*  PROT 6.6  ALBUMIN 3.1*    Coagulation Profile: Recent Labs  Lab 10/29/17 1819 10/30/17 0541  INR 1.74 1.84     Recent Results (from the past 240 hour(s))  Culture, blood (Routine x 2)     Status: None (Preliminary result)   Collection Time: 10/29/17  6:19 PM  Result Value Ref Range Status   Specimen Description BLOOD RIGHT WRIST  Final  Special Requests   Final    BOTTLES DRAWN AEROBIC AND ANAEROBIC Blood Culture adequate volume   Culture  Setup Time   Final    GRAM POSITIVE COCCI IN CLUSTERS ANAEROBIC BOTTLE ONLY Organism ID to follow Performed at [facility], [place] [address], [place], [place] [address]    Culture GRAM POSITIVE COCCI  Final   Report Status PENDING  Incomplete  Culture, blood (Routine x 2)     Status: None (Preliminary result)   Collection Time: 10/29/17  7:15 PM  Result Value Ref Range Status   Specimen Description BLOOD RIGHT FOREARM  Final   Special Requests   Final    BOTTLES DRAWN AEROBIC AND ANAEROBIC Blood Culture adequate volume    Culture   Final    NO GROWTH < 12 HOURS Performed at [facility], [address], [place], [place] [address]    Report Status PENDING  Incomplete      Radiology Studies: Dg Chest Port 1 View  Result Date: 10/29/2017 CLINICAL DATA:  Altered mental status and fever. Ex-smoker. EXAM: PORTABLE CHEST 1 VIEW COMPARISON:  07/17/2014. FINDINGS: Stable enlarged cardiac silhouette and tortuous and calcified thoracic aorta. The interstitial markings remain mildly prominent. Stable small amount of linear scarring in the right mid lung zone. Thoracic spine degenerative changes. IMPRESSION: Stable cardiomegaly and mild chronic interstitial lung disease. No acute abnormality. Electronically Signed   By: Claudie Revering M.D.   On: 10/29/2017 19:46     Medications:  Scheduled: . docusate sodium  200 mg Oral BID  . enzalutamide  40 mg Oral Daily  . furosemide  40 mg Oral Daily  . polyethylene glycol  17 g Oral Daily  . warfarin  5 mg Oral q1800  . Warfarin - Physician Dosing Inpatient   Does not apply q1800   Continuous: . ceFEPime (MAXIPIME) IV 2 g (10/30/17 0354)   SFK:[id number], bisacodyl, ondansetron **OR** ondansetron (ZOFRAN) IV, oxybutynin  Assessment/Plan:    UTI in the setting of chronic Foley Patient had elevated WBC.  UA was positive.  He was experiencing nausea and vomiting.  Previous history of UTI.  He was placed on cefepime which will be continued.  Follow-up on urine cultures.  Foley catheter was changed last night to the emergency department.  He is followed by Dr. Junious Silk with urology as an outpatient.  Positive blood cultures One out of two sets of blood culture positive for gram-positive cocci.  Most likely a contaminant.  Await final identification.  History of previous stroke and dysphagia Aspiration precautions.  According to his wife for patient's diet has been liberalized for pleasure feeds.  Continue to watch closely.  History of chronic diastolic  CHF Appears to be well compensated currently.  Continue current medications including Lasix.  Strict ins and outs.  Daily weights.  History of essential hypertension Noted to be on amlodipine at home which is being held.  Monitor blood pressures closely.  Metastatic prostate cancer Continue home medications.  History of constipation Usually has a bowel movement every 3 days.  Has not had in the last 4 days now.  He has been given MiraLAX and stool softeners.  Monitor for now.  History of paroxysmal atrial fibrillation on Coumadin INR was noted to be subtherapeutic.  Warfarin per pharmacy.  Rate appears to be adequately controlled.  Not noted to be on any rate limiting drugs at home.  Chronic kidney disease stage III Renal function is at baseline.   DVT  Prophylaxis: On warfarin    Code Status: Full code Family Communication: Discussed with the patient and his wife Disposition Plan: Management as outlined above.  Mobilize as tolerated.  Await culture results.  Patient will need to be on IV antibiotics secondary to complicated UTI and risk factors for Pseudomonas.  Plus he was nauseated and had vomiting at the time of presentation.    LOS: 0 days   [facility] Pager [phone] 10/30/2017, 12:03 PM  If 7PM-7AM, please contact night-coverage at [email or web address], password [facility]

## 2017-10-30 NOTE — Progress Notes (Addendum)
PHARMACY NOTE:  ANTIMICROBIAL RENAL DOSAGE ADJUSTMENT  Current antimicrobial regimen includes a mismatch between antimicrobial dosage and estimated renal function.  As per policy approved by the Pharmacy & Therapeutics and Medical Executive Committees, the antimicrobial dosage will be adjusted accordingly.  Current antimicrobial dosage:  Cefepime 2gm IV Q12H  Indication: UTI  Renal Function:  Estimated Creatinine Clearance: 39 mL/min (A) (by C-G formula based on SCr of 1.38 mg/dL (H)). []      On intermittent HD, scheduled: []      On CRRT    Antimicrobial dosage has been changed to:  Cefepime 2gm IV Q24H  Additional comments:  F/u culture and treatment for possible bacteremia   Thank you for allowing pharmacy to be a part of this patient's care.  Chevon Laufer D. Mina Marble, PharmD, BCPS, [place] 10/30/2017, 2:09 PM

## 2017-10-30 NOTE — Discharge Instructions (Signed)

## 2017-10-30 NOTE — Plan of Care (Signed)
  Problem: Urinary Elimination: Goal: Signs and symptoms of infection will decrease Outcome: Progressing   Problem: Clinical Measurements: Goal: Ability to maintain clinical measurements within normal limits will improve Outcome: Progressing Goal: Diagnostic test results will improve Outcome: Progressing   Problem: Activity: Goal: Risk for activity intolerance will decrease Outcome: Progressing

## 2017-10-31 DIAGNOSIS — K219 Gastro-esophageal reflux disease without esophagitis: Secondary | ICD-10-CM | POA: Diagnosis present

## 2017-10-31 DIAGNOSIS — N183 Chronic kidney disease, stage 3 (moderate): Secondary | ICD-10-CM | POA: Diagnosis present

## 2017-10-31 DIAGNOSIS — Z85820 Personal history of malignant melanoma of skin: Secondary | ICD-10-CM | POA: Diagnosis not present

## 2017-10-31 DIAGNOSIS — M549 Dorsalgia, unspecified: Secondary | ICD-10-CM | POA: Diagnosis present

## 2017-10-31 DIAGNOSIS — Z993 Dependence on wheelchair: Secondary | ICD-10-CM | POA: Diagnosis not present

## 2017-10-31 DIAGNOSIS — B957 Other staphylococcus as the cause of diseases classified elsewhere: Secondary | ICD-10-CM | POA: Diagnosis not present

## 2017-10-31 DIAGNOSIS — M19071 Primary osteoarthritis, right ankle and foot: Secondary | ICD-10-CM | POA: Diagnosis present

## 2017-10-31 DIAGNOSIS — Y846 Urinary catheterization as the cause of abnormal reaction of the patient, or of later complication, without mention of misadventure at the time of the procedure: Secondary | ICD-10-CM | POA: Diagnosis present

## 2017-10-31 DIAGNOSIS — I517 Cardiomegaly: Secondary | ICD-10-CM | POA: Diagnosis not present

## 2017-10-31 DIAGNOSIS — R112 Nausea with vomiting, unspecified: Secondary | ICD-10-CM | POA: Diagnosis not present

## 2017-10-31 DIAGNOSIS — I351 Nonrheumatic aortic (valve) insufficiency: Secondary | ICD-10-CM | POA: Diagnosis not present

## 2017-10-31 DIAGNOSIS — T83511A Infection and inflammatory reaction due to indwelling urethral catheter, initial encounter: Secondary | ICD-10-CM | POA: Diagnosis not present

## 2017-10-31 DIAGNOSIS — I358 Other nonrheumatic aortic valve disorders: Secondary | ICD-10-CM | POA: Diagnosis not present

## 2017-10-31 DIAGNOSIS — I38 Endocarditis, valve unspecified: Secondary | ICD-10-CM | POA: Diagnosis not present

## 2017-10-31 DIAGNOSIS — I480 Paroxysmal atrial fibrillation: Secondary | ICD-10-CM | POA: Diagnosis present

## 2017-10-31 DIAGNOSIS — N390 Urinary tract infection, site not specified: Secondary | ICD-10-CM | POA: Diagnosis not present

## 2017-10-31 DIAGNOSIS — C61 Malignant neoplasm of prostate: Secondary | ICD-10-CM | POA: Diagnosis present

## 2017-10-31 DIAGNOSIS — W19XXXA Unspecified fall, initial encounter: Secondary | ICD-10-CM | POA: Diagnosis not present

## 2017-10-31 DIAGNOSIS — N179 Acute kidney failure, unspecified: Secondary | ICD-10-CM | POA: Diagnosis present

## 2017-10-31 DIAGNOSIS — R42 Dizziness and giddiness: Secondary | ICD-10-CM | POA: Diagnosis not present

## 2017-10-31 DIAGNOSIS — I350 Nonrheumatic aortic (valve) stenosis: Secondary | ICD-10-CM | POA: Diagnosis present

## 2017-10-31 DIAGNOSIS — E785 Hyperlipidemia, unspecified: Secondary | ICD-10-CM | POA: Diagnosis present

## 2017-10-31 DIAGNOSIS — I330 Acute and subacute infective endocarditis: Secondary | ICD-10-CM | POA: Diagnosis present

## 2017-10-31 DIAGNOSIS — R509 Fever, unspecified: Secondary | ICD-10-CM | POA: Diagnosis present

## 2017-10-31 DIAGNOSIS — R7881 Bacteremia: Secondary | ICD-10-CM | POA: Diagnosis not present

## 2017-10-31 DIAGNOSIS — I5032 Chronic diastolic (congestive) heart failure: Secondary | ICD-10-CM | POA: Diagnosis not present

## 2017-10-31 DIAGNOSIS — Z8673 Personal history of transient ischemic attack (TIA), and cerebral infarction without residual deficits: Secondary | ICD-10-CM | POA: Diagnosis not present

## 2017-10-31 DIAGNOSIS — Z87440 Personal history of urinary (tract) infections: Secondary | ICD-10-CM | POA: Diagnosis not present

## 2017-10-31 DIAGNOSIS — Z888 Allergy status to other drugs, medicaments and biological substances status: Secondary | ICD-10-CM | POA: Diagnosis not present

## 2017-10-31 DIAGNOSIS — A411 Sepsis due to other specified staphylococcus: Secondary | ICD-10-CM | POA: Diagnosis present

## 2017-10-31 DIAGNOSIS — I10 Essential (primary) hypertension: Secondary | ICD-10-CM | POA: Diagnosis not present

## 2017-10-31 DIAGNOSIS — Z7901 Long term (current) use of anticoagulants: Secondary | ICD-10-CM | POA: Diagnosis not present

## 2017-10-31 DIAGNOSIS — Z452 Encounter for adjustment and management of vascular access device: Secondary | ICD-10-CM | POA: Diagnosis not present

## 2017-10-31 DIAGNOSIS — M19072 Primary osteoarthritis, left ankle and foot: Secondary | ICD-10-CM | POA: Diagnosis present

## 2017-10-31 DIAGNOSIS — R011 Cardiac murmur, unspecified: Secondary | ICD-10-CM | POA: Diagnosis not present

## 2017-10-31 DIAGNOSIS — I130 Hypertensive heart and chronic kidney disease with heart failure and stage 1 through stage 4 chronic kidney disease, or unspecified chronic kidney disease: Secondary | ICD-10-CM | POA: Diagnosis present

## 2017-10-31 DIAGNOSIS — G8929 Other chronic pain: Secondary | ICD-10-CM | POA: Diagnosis present

## 2017-10-31 DIAGNOSIS — R319 Hematuria, unspecified: Secondary | ICD-10-CM | POA: Diagnosis not present

## 2017-10-31 DIAGNOSIS — C7951 Secondary malignant neoplasm of bone: Secondary | ICD-10-CM | POA: Diagnosis not present

## 2017-10-31 LAB — URINE CULTURE

## 2017-10-31 LAB — BASIC METABOLIC PANEL
ANION GAP: 6 (ref 5–15)
BUN: 45 mg/dL — ABNORMAL HIGH (ref 8–23)
CALCIUM: 9.2 mg/dL (ref 8.9–10.3)
CHLORIDE: 107 mmol/L (ref 98–111)
CO2: 27 mmol/L (ref 22–32)
Creatinine, Ser: 1.37 mg/dL — ABNORMAL HIGH (ref 0.61–1.24)
GFR calc non Af Amer: 44 mL/min — ABNORMAL LOW (ref >60)
GFR, EST AFRICAN AMERICAN: 51 mL/min — AB (ref >60)
Glucose, Bld: 102 mg/dL — ABNORMAL HIGH (ref 70–99)
Potassium: 4.3 mmol/L (ref 3.5–5.1)
SODIUM: 140 mmol/L (ref 135–145)

## 2017-10-31 LAB — CBC WITH DIFFERENTIAL/PLATELET
Abs Immature Granulocytes: 0.1 10*3/uL (ref 0.0–0.1)
BASOS ABS: 0 10*3/uL (ref 0.0–0.1)
Basophils Relative: 0 %
EOS PCT: 3 %
Eosinophils Absolute: 0.2 10*3/uL (ref 0.0–0.7)
HCT: 36.6 % — ABNORMAL LOW (ref 39.0–52.0)
HEMOGLOBIN: 11.6 g/dL — AB (ref 13.0–17.0)
IMMATURE GRANULOCYTES: 1 %
LYMPHS ABS: 1.3 10*3/uL (ref 0.7–4.0)
Lymphocytes Relative: 13 %
MCH: 31.5 pg (ref 26.0–34.0)
MCHC: 31.7 g/dL (ref 30.0–36.0)
MCV: 99.5 fL (ref 78.0–100.0)
Monocytes Absolute: 1 10*3/uL (ref 0.1–1.0)
Monocytes Relative: 11 %
NEUTROS PCT: 72 %
Neutro Abs: 6.7 10*3/uL (ref 1.7–7.7)
Platelets: 125 10*3/uL — ABNORMAL LOW (ref 150–400)
RBC: 3.68 MIL/uL — AB (ref 4.22–5.81)
RDW: 14.6 % (ref 11.5–15.5)
WBC: 9.3 10*3/uL (ref 4.0–10.5)

## 2017-10-31 LAB — PROTIME-INR
INR: 2.35
Prothrombin Time: 25.6 seconds — ABNORMAL HIGH (ref 11.4–15.2)

## 2017-10-31 LAB — MAGNESIUM: MAGNESIUM: 2 mg/dL (ref 1.7–2.4)

## 2017-10-31 MED ORDER — AMLODIPINE BESYLATE 10 MG PO TABS
10.0000 mg | ORAL_TABLET | Freq: Every day | ORAL | Status: DC
  Administered 2017-10-31 – 2017-11-03 (×4): 10 mg via ORAL
  Filled 2017-10-31 (×4): qty 1

## 2017-10-31 MED ORDER — WARFARIN SODIUM 2.5 MG PO TABS
2.5000 mg | ORAL_TABLET | Freq: Once | ORAL | Status: AC
  Administered 2017-10-31: 2.5 mg via ORAL
  Filled 2017-10-31: qty 1

## 2017-10-31 MED ORDER — WARFARIN - PHARMACIST DOSING INPATIENT: Freq: Every day | Status: DC

## 2017-10-31 NOTE — Progress Notes (Addendum)
[facility] PROGRESS NOTE  Craig [name]. 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 Wife (primary caretaker) activated EMS -- noted initial BP in 200s/100s with tachycardia to 120-160s. Low grade fever up to 100.4 at home. He has had about 3-4 admissions for UTI in the past 4 years since he has had the foley. Wife also reports that patient had not had a bowel movement in 3 days (usually has BM every 2-3 days).   Patient is found to have abnormal UA.  He was hospitalized for management of UTI.  Subsequently noted to have bacteremia.  Reason for Visit: Complicated UTI in the setting of indwelling Foley catheter  Consultants: None  Procedures: Replacement of Foley catheter while he was in the ED.  Antibiotics: Cefepime  Subjective/Interval History: Patient states that he feels well.  Denies any abdominal pain nausea or vomiting.  Denies any skin rashes or wounds on his skin.  His daughter is at the bedside.    ROS: Denies any headaches.  No shortness of breath.  Objective:  Vital Signs  Vitals:   10/30/17 0518 10/30/17 1814 10/30/17 2017 10/31/17 0514  BP: (!) 130/56 (!) 149/55 (!) 159/78 (!) 171/97  Pulse: (!) 44 71 66 69  Resp: 16 17 16 19   Temp: 98.5 F (36.9 C) 99 F (37.2 C) 98.7 F (37.1 C) 98.4 F (36.9 C)  TempSrc: Oral Oral Oral Oral  SpO2: 99% 96% 94% 90%  Weight:      Height:        Intake/Output Summary (Last 24 hours) at 10/31/2017 0855 Last data filed at 10/31/2017 0141 Gross per 24 hour  Intake 120 ml  Output 625 ml  Net -505 ml   Filed Weights   10/29/17 2316 10/30/17 0500  Weight: 99.3 kg 90.1 kg    General appearance: Awake alert.  In no distress. Resp: Clear to  auscultation bilaterally.  No wheezing rales or rhonchi Cardio: S1-S2 is normal regular.  No S3-S4.  No rubs murmurs or bruit GI: Abdomen is soft.  Nontender nondistended.  Bowel sounds are present.  No masses or organomegaly Extremities: Mild edema.  No obvious wounds or ulcers noted on his feet. Neurologic: Mildly distracted.  No obvious focal neurological deficits.  Lab Results:  Data Reviewed: I have personally reviewed following labs and imaging studies  CBC: Recent Labs  Lab 10/29/17 1819 10/30/17 0541 10/31/17 0657  WBC 15.0* 14.0* 9.3  NEUTROABS 13.3* 10.9* 6.7  HGB 13.8 11.2* 11.6*  HCT 42.5 35.2* 36.6*  MCV 97.0 98.3 99.5  PLT 151 128* 125*    Basic Metabolic Panel: Recent Labs  Lab 10/29/17 1819 10/30/17 0541 10/31/17 0657  NA 139 140 140  K 4.8 4.0 4.3  CL 103 107 107  CO2 23 26 27   GLUCOSE 161* 116* 102*  BUN 43* 46* 45*  CREATININE 1.36* 1.38* 1.37*  CALCIUM 10.1 9.3 9.2  MG  --  1.9 2.0    GFR: Estimated Creatinine Clearance: 39.3 mL/min (A) (by C-G formula based on SCr of 1.37 mg/dL (H)).  Liver Function Tests: Recent Labs  Lab 10/29/17 1819  AST 30  ALT 12  ALKPHOS 55  BILITOT 1.5*  PROT 6.6  ALBUMIN 3.1*    Coagulation Profile: Recent Labs  Lab 10/29/17  1819 10/30/17 0541 10/31/17 0657  INR 1.74 1.84 2.35     Recent Results (from the past 240 hour(s))  Urine culture     Status: Abnormal   Collection Time: 10/29/17  6:03 PM  Result Value Ref Range Status   Specimen Description URINE, CATHETERIZED  Final   Special Requests   Final    NONE Performed at [facility], [place] [address], [place], [place] [address]    Culture MULTIPLE SPECIES PRESENT, SUGGEST RECOLLECTION (A)  Final   Report Status 10/31/2017 FINAL  Final  Culture, blood (Routine x 2)     Status: None (Preliminary result)   Collection Time: 10/29/17  6:19 PM  Result Value Ref Range Status   Specimen Description BLOOD RIGHT WRIST  Final   Special Requests    Final    BOTTLES DRAWN AEROBIC AND ANAEROBIC Blood Culture adequate volume   Culture  Setup Time   Final    GRAM POSITIVE COCCI IN CLUSTERS IN BOTH AEROBIC AND ANAEROBIC BOTTLES CRITICAL RESULT CALLED TO, READ BACK BY AND VERIFIED WITH: A. MASTERS, PHARMD AT 1325 ON 10/30/17 BY C. JESSUP, MLT. Performed at [facility], [place] [address], [place], [place] [address]    Culture GRAM POSITIVE COCCI  Final   Report Status PENDING  Incomplete  Blood Culture ID Panel (Reflexed)     Status: Abnormal   Collection Time: 10/29/17  6:19 PM  Result Value Ref Range Status   Enterococcus species NOT DETECTED NOT DETECTED Final   Listeria monocytogenes NOT DETECTED NOT DETECTED Final   Staphylococcus species DETECTED (A) NOT DETECTED Final    Comment: Methicillin (oxacillin) susceptible coagulase negative staphylococcus. Possible blood culture contaminant (unless isolated from more than one blood culture draw or clinical case suggests pathogenicity). No antibiotic treatment is indicated for blood  culture contaminants. CRITICAL RESULT CALLED TO, READ BACK BY AND VERIFIED WITH: A. MASTERS, PHARMD AT 1325 ON 10/30/17 BY C. JESSUP, MLT.    Staphylococcus aureus NOT DETECTED NOT DETECTED Final   Methicillin resistance NOT DETECTED NOT DETECTED Final   Streptococcus species NOT DETECTED NOT DETECTED Final   Streptococcus agalactiae NOT DETECTED NOT DETECTED Final   Streptococcus pneumoniae NOT DETECTED NOT DETECTED Final   Streptococcus pyogenes NOT DETECTED NOT DETECTED Final   Acinetobacter baumannii NOT DETECTED NOT DETECTED Final   Enterobacteriaceae species NOT DETECTED NOT DETECTED Final   Enterobacter cloacae complex NOT DETECTED NOT DETECTED Final   Escherichia coli NOT DETECTED NOT DETECTED Final   Klebsiella oxytoca NOT DETECTED NOT DETECTED Final   Klebsiella pneumoniae NOT DETECTED NOT DETECTED Final   Proteus species NOT DETECTED NOT DETECTED Final   Serratia marcescens NOT DETECTED NOT  DETECTED Final   Haemophilus influenzae NOT DETECTED NOT DETECTED Final   Neisseria meningitidis NOT DETECTED NOT DETECTED Final   Pseudomonas aeruginosa NOT DETECTED NOT DETECTED Final   Candida albicans NOT DETECTED NOT DETECTED Final   Candida glabrata NOT DETECTED NOT DETECTED Final   Candida krusei NOT DETECTED NOT DETECTED Final   Candida parapsilosis NOT DETECTED NOT DETECTED Final   Candida tropicalis NOT DETECTED NOT DETECTED Final    Comment: Performed at [facility], [address] [place], [place] [address]  Culture, blood (Routine x 2)     Status: None (Preliminary result)   Collection Time: 10/29/17  7:15 PM  Result Value Ref Range Status   Specimen Description BLOOD RIGHT FOREARM  Final   Special Requests   Final  BOTTLES DRAWN AEROBIC AND ANAEROBIC Blood Culture adequate volume   Culture  Setup Time   Final    GRAM POSITIVE COCCI IN BOTH AEROBIC AND ANAEROBIC BOTTLES CRITICAL RESULT CALLED TO, READ BACK BY AND VERIFIED WITH: A. MASTERS, PHARMD AT 1325 ON 10/30/17 BY C. JESSUP, MLT. Performed at [facility], [place] [address] [place], [place] [address]    Culture GRAM POSITIVE COCCI  Final   Report Status PENDING  Incomplete  Culture, blood (Routine X 2) w Reflex to ID Panel     Status: None (Preliminary result)   Collection Time: 10/30/17  2:48 PM  Result Value Ref Range Status   Specimen Description BLOOD RIGHT ARM  Final   Special Requests   Final    BOTTLES DRAWN AEROBIC AND ANAEROBIC Blood Culture results may not be optimal due to an inadequate volume of blood received in culture bottles   Culture   Final    NO GROWTH < 24 HOURS Performed at [facility], [place] [address] [place], [place] [address]    Report Status PENDING  Incomplete  Culture, blood (Routine X 2) w Reflex to ID Panel     Status: None (Preliminary result)   Collection Time: 10/30/17  2:59 PM  Result Value Ref Range Status   Specimen Description BLOOD LEFT ARM  Final    Special Requests   Final    BOTTLES DRAWN AEROBIC AND ANAEROBIC Blood Culture adequate volume   Culture   Final    NO GROWTH < 24 HOURS Performed at [facility], [place] [address], [place], [place] [address]    Report Status PENDING  Incomplete      Radiology Studies: Dg Chest Port 1 View  Result Date: 10/29/2017 CLINICAL DATA:  Altered mental status and fever. Ex-smoker. EXAM: PORTABLE CHEST 1 VIEW COMPARISON:  07/17/2014. FINDINGS: Stable enlarged cardiac silhouette and tortuous and calcified thoracic aorta. The interstitial markings remain mildly prominent. Stable small amount of linear scarring in the right mid lung zone. Thoracic spine degenerative changes. IMPRESSION: Stable cardiomegaly and mild chronic interstitial lung disease. No acute abnormality. Electronically Signed   By: Claudie Revering M.D.   On: 10/29/2017 19:46     Medications:  Scheduled: . docusate sodium  200 mg Oral BID  . enzalutamide  40 mg Oral Daily  . furosemide  40 mg Oral Daily  . polyethylene glycol  17 g Oral Daily  . warfarin  5 mg Oral q1800  . Warfarin - Physician Dosing Inpatient   Does not apply q1800   Continuous: . ceFEPime (MAXIPIME) IV 2 g (10/31/17 0841)   OTL:[id number], bisacodyl, ondansetron **OR** ondansetron (ZOFRAN) IV, oxybutynin  Assessment/Plan:    UTI in the setting of chronic Foley/Sepsis. Patient had elevated WBC.  UA was positive.  He was experiencing nausea and vomiting.  Previous history of UTI.  He was placed on cefepime which will be continued.  Urine culture showed multiple species.  Sample will be sent again. Foley catheter was changed in the emergency department.  He is followed by Dr. Junious Silk with urology as an outpatient.  Previous culture data reviewed.  Patient's urine culture grew Klebsiella and E. coli in 2017.  Sensitivities reviewed.  Could consider changing him to cephalexin.  But will wait for the bacteremia issue to be sorted out first. Patient with sepsis  at presentation. Now better.  Bacteremia 2 sets of blood culture positive for coag negative staph.  However patient remains afebrile.  This is an unusual situation.  Clinically this does not appear to be a true infection but 2 sets of positive.  They both could be contaminants.  Blood cultures repeated yesterday.  Wait for them to resolve.  Hold off on escalation of antibiotic treatment.   History of previous stroke and dysphagia Aspiration precautions.  According to his wife for patient's diet has been liberalized for pleasure feeds.  Continue to watch closely.  History of chronic diastolic CHF Appears to be well compensated currently.  Continue current medications including Lasix.  Strict ins and outs.  Daily weights.  Potassium level is normal.  Creatinine is stable.  History of essential hypertension Patient's amlodipine was held at admission.  Blood pressure noted to be elevated.  We will resume.    Metastatic prostate cancer Continue home medications.  History of constipation Usually has a bowel movement every 3 days.  Continue bowel regimen.  History of paroxysmal atrial fibrillation on Coumadin Warfarin per pharmacy.  INR is therapeutic today.  Not noted to be on any rate limiting drugs at home.  Chronic kidney disease stage III Renal function is at baseline.   DVT Prophylaxis: On warfarin    Code Status: Full code Family Communication: Discussed with the patient and his wife Disposition Plan: Management as outlined above.  Urine sample to be resent for culture.  2 sets of blood culture positive for bacteria.  Wait on repeat blood culture sent on 9/8.  Continue IV antibiotics for now due to complicated UTI and risk factors.      LOS: 0 days   [facility] Pager [phone] 10/31/2017, 8:55 AM  If 7PM-7AM, please contact night-coverage at [email or web address], password [facility]

## 2017-10-31 NOTE — Progress Notes (Signed)
ANTICOAGULATION CONSULT NOTE - Initial Consult  Pharmacy Consult for Coumadin Indication: atrial fibrillation  Allergies  Allergen Reactions  . Pravachol Other (See Comments)    Muscle weakness  . Zocor [Simvastatin - High Dose] Other (See Comments)    Muscle weakness    Patient Measurements: Height: 6' (182.9 cm) Weight: 198 lb 10.2 oz (90.1 kg) IBW/kg (Calculated) : 77.6  Vital Signs: Temp: 98.4 F (36.9 C) (09/09 0514) Temp Source: Oral (09/09 0514) BP: 171/97 (09/09 0514) Pulse Rate: 69 (09/09 0514)  Labs: Recent Labs    10/29/17 1819 10/30/17 0541 10/31/17 0657  HGB 13.8 11.2* 11.6*  HCT 42.5 35.2* 36.6*  PLT 151 128* 125*  LABPROT 20.2* 21.1* 25.6*  INR 1.74 1.84 2.35  CREATININE 1.36* 1.38* 1.37*    Estimated Creatinine Clearance: 39.3 mL/min (A) (by C-G formula based on SCr of 1.37 mg/dL (H)).   Medical History: Past Medical History:  Diagnosis Date  . Atrial fibrillation ([place])   . Chronic back pain   . GERD (gastroesophageal reflux disease)   . H/O hiatal hernia   . History of epistaxis 07/19/2002  . Hyperlipidemia   . Hypertension   . Hypertensive heart disease   . Hypokalemia   . ICH (intracerebral hemorrhage) ([place]) ~ 1999   after TPA/notes 09/27/2012  . Melanoma ([place])    "top of my head" (09/27/2012)  . Migraines    "migraines without headaches years ago" (09/27/2012)  . Osteoarthritis of both feet   . PAT (paroxysmal atrial tachycardia) ([place])   . Personal history of long-term (current) use of anticoagulants   . Pneumonia    "once; several years ago" (09/27/2012)  . Prostate cancer, primary, with metastasis from prostate to other site [facility])    on Lupron injections per GU  . S/P radiation therapy 02/13/14-02/27/14   SRS 5/5 spine completed 02/27/14  . S/P radiation therapy 05/27/14-05/31/14   right femur 20Gy/46fx  . Stroke [facility]) ~ 1999   ischemic / right cerebellar/posterior inferior cerebellar artery / right pos infarct     Assessment: 84 yo M  on Coumadin 2.5mg  daily exc for 5mg  on Mon/Thurs/Sat for hx Afib, hx CVA/ICH in 1999. INR up to 2.35 today. Hgb 11.6, plts wnl.  Goal of Therapy:  INR 2-3 Monitor platelets by anticoagulation protocol: Yes   Plan:  Give Coumadin 2.5mg  PO tonight Monitor daily INR, CBC, s/s of bleed  Annalysa Mohammad J 10/31/2017,10:10 AM

## 2017-11-01 ENCOUNTER — Telehealth: Payer: Self-pay | Admitting: Family Medicine

## 2017-11-01 ENCOUNTER — Inpatient Hospital Stay: Payer: Medicare Other

## 2017-11-01 ENCOUNTER — Inpatient Hospital Stay: Payer: Medicare Other | Attending: Oncology | Admitting: Oncology

## 2017-11-01 DIAGNOSIS — Z888 Allergy status to other drugs, medicaments and biological substances status: Secondary | ICD-10-CM

## 2017-11-01 DIAGNOSIS — R112 Nausea with vomiting, unspecified: Secondary | ICD-10-CM

## 2017-11-01 DIAGNOSIS — R531 Weakness: Secondary | ICD-10-CM

## 2017-11-01 DIAGNOSIS — Z87820 Personal history of traumatic brain injury: Secondary | ICD-10-CM

## 2017-11-01 DIAGNOSIS — I480 Paroxysmal atrial fibrillation: Secondary | ICD-10-CM

## 2017-11-01 DIAGNOSIS — C61 Malignant neoplasm of prostate: Secondary | ICD-10-CM

## 2017-11-01 DIAGNOSIS — B957 Other staphylococcus as the cause of diseases classified elsewhere: Secondary | ICD-10-CM

## 2017-11-01 DIAGNOSIS — Z87891 Personal history of nicotine dependence: Secondary | ICD-10-CM

## 2017-11-01 DIAGNOSIS — Z960 Presence of urogenital implants: Secondary | ICD-10-CM

## 2017-11-01 DIAGNOSIS — Z7401 Bed confinement status: Secondary | ICD-10-CM

## 2017-11-01 DIAGNOSIS — C7951 Secondary malignant neoplasm of bone: Secondary | ICD-10-CM

## 2017-11-01 DIAGNOSIS — R011 Cardiac murmur, unspecified: Secondary | ICD-10-CM

## 2017-11-01 DIAGNOSIS — R42 Dizziness and giddiness: Secondary | ICD-10-CM

## 2017-11-01 DIAGNOSIS — R7881 Bacteremia: Secondary | ICD-10-CM

## 2017-11-01 DIAGNOSIS — R319 Hematuria, unspecified: Secondary | ICD-10-CM

## 2017-11-01 DIAGNOSIS — Z87440 Personal history of urinary (tract) infections: Secondary | ICD-10-CM

## 2017-11-01 LAB — CULTURE, BLOOD (ROUTINE X 2)
SPECIAL REQUESTS: ADEQUATE
SPECIAL REQUESTS: ADEQUATE

## 2017-11-01 LAB — CBC WITH DIFFERENTIAL/PLATELET
Abs Immature Granulocytes: 0 10*3/uL (ref 0.0–0.1)
BASOS PCT: 0 %
Basophils Absolute: 0 10*3/uL (ref 0.0–0.1)
EOS ABS: 0.2 10*3/uL (ref 0.0–0.7)
EOS PCT: 2 %
HEMATOCRIT: 38.3 % — AB (ref 39.0–52.0)
Hemoglobin: 12.3 g/dL — ABNORMAL LOW (ref 13.0–17.0)
IMMATURE GRANULOCYTES: 0 %
Lymphocytes Relative: 14 %
Lymphs Abs: 1.3 10*3/uL (ref 0.7–4.0)
MCH: 31.5 pg (ref 26.0–34.0)
MCHC: 32.1 g/dL (ref 30.0–36.0)
MCV: 98 fL (ref 78.0–100.0)
Monocytes Absolute: 1 10*3/uL (ref 0.1–1.0)
Monocytes Relative: 11 %
NEUTROS PCT: 73 %
Neutro Abs: 6.5 10*3/uL (ref 1.7–7.7)
PLATELETS: 139 10*3/uL — AB (ref 150–400)
RBC: 3.91 MIL/uL — AB (ref 4.22–5.81)
RDW: 14.2 % (ref 11.5–15.5)
WBC: 9 10*3/uL (ref 4.0–10.5)

## 2017-11-01 LAB — HEPATIC FUNCTION PANEL
ALBUMIN: 2.5 g/dL — AB (ref 3.5–5.0)
ALK PHOS: 59 U/L (ref 38–126)
ALT: 16 U/L (ref 0–44)
AST: 20 U/L (ref 15–41)
BILIRUBIN DIRECT: 0.2 mg/dL (ref 0.0–0.2)
BILIRUBIN TOTAL: 0.9 mg/dL (ref 0.3–1.2)
Indirect Bilirubin: 0.7 mg/dL (ref 0.3–0.9)
Total Protein: 6.1 g/dL — ABNORMAL LOW (ref 6.5–8.1)

## 2017-11-01 LAB — BASIC METABOLIC PANEL
ANION GAP: 9 (ref 5–15)
BUN: 38 mg/dL — ABNORMAL HIGH (ref 8–23)
CALCIUM: 9.3 mg/dL (ref 8.9–10.3)
CO2: 25 mmol/L (ref 22–32)
Chloride: 104 mmol/L (ref 98–111)
Creatinine, Ser: 1.25 mg/dL — ABNORMAL HIGH (ref 0.61–1.24)
GFR calc Af Amer: 57 mL/min — ABNORMAL LOW (ref >60)
GFR, EST NON AFRICAN AMERICAN: 49 mL/min — AB (ref >60)
GLUCOSE: 126 mg/dL — AB (ref 70–99)
Potassium: 4.2 mmol/L (ref 3.5–5.1)
Sodium: 138 mmol/L (ref 135–145)

## 2017-11-01 LAB — PROTIME-INR
INR: 2.26
Prothrombin Time: 24.8 seconds — ABNORMAL HIGH (ref 11.4–15.2)

## 2017-11-01 LAB — MAGNESIUM: Magnesium: 2.2 mg/dL (ref 1.7–2.4)

## 2017-11-01 MED ORDER — ONDANSETRON HCL 4 MG/2ML IJ SOLN
4.0000 mg | Freq: Once | INTRAMUSCULAR | Status: AC
  Administered 2017-11-01: 4 mg via INTRAVENOUS
  Filled 2017-11-01: qty 2

## 2017-11-01 MED ORDER — WARFARIN SODIUM 2 MG PO TABS
2.0000 mg | ORAL_TABLET | Freq: Once | ORAL | Status: AC
  Administered 2017-11-01: 2 mg via ORAL
  Filled 2017-11-01: qty 1

## 2017-11-01 MED ORDER — ONDANSETRON 4 MG PO TBDP: 4.0000 mg | ORAL_TABLET | Freq: Three times a day (TID) | ORAL | 0 refills | Status: DC | PRN

## 2017-11-01 MED ORDER — CEPHALEXIN 500 MG PO CAPS: 500.0000 mg | ORAL_CAPSULE | Freq: Three times a day (TID) | ORAL | 0 refills | Status: DC

## 2017-11-01 MED ORDER — CEFAZOLIN SODIUM-DEXTROSE 1-4 GM/50ML-% IV SOLN
1.0000 g | Freq: Three times a day (TID) | INTRAVENOUS | Status: DC
  Filled 2017-11-01: qty 50

## 2017-11-01 MED ORDER — CEPHALEXIN 500 MG PO CAPS
500.0000 mg | ORAL_CAPSULE | Freq: Three times a day (TID) | ORAL | Status: DC
  Administered 2017-11-01: 500 mg via ORAL
  Filled 2017-11-01: qty 1

## 2017-11-01 MED ORDER — CEFAZOLIN SODIUM-DEXTROSE 2-4 GM/100ML-% IV SOLN
2.0000 g | Freq: Three times a day (TID) | INTRAVENOUS | Status: DC
  Administered 2017-11-01 – 2017-11-03 (×6): 2 g via INTRAVENOUS
  Filled 2017-11-01 (×7): qty 100

## 2017-11-01 MED ORDER — WARFARIN SODIUM 2.5 MG PO TABS: 2.5000 mg | ORAL_TABLET | Freq: Once | ORAL | Status: DC

## 2017-11-01 MED ORDER — FAMOTIDINE 20 MG PO TABS
20.0000 mg | ORAL_TABLET | Freq: Every day | ORAL | Status: DC
  Administered 2017-11-01 – 2017-11-03 (×3): 20 mg via ORAL
  Filled 2017-11-01 (×3): qty 1

## 2017-11-01 NOTE — Telephone Encounter (Signed)
Called patient left message to call the office. MPulliam, CMA/RT(R)  

## 2017-11-01 NOTE — Telephone Encounter (Signed)
Pt was taken to ED due to lack of appetite, pt's wife took him to ED for care & he is suppose to be discharged today from 24hr OBS --pt wife wishes provider to advise her on what patient can be given to enhance his appetite, he is losing weight due to barely eating & intake of fluids-- Patient has not been seen by provider since 2017.  --Forwarding request to medical assistant to contact pt with any suggestions @ [phone].  --glh

## 2017-11-01 NOTE — Consult Note (Signed)
Date of Admission:  10/29/2017          Reason for Consult:  Craig  [name] bacteremia     Referring Provider: Dr. Maryland Pink   Assessment:  1. Craig looked and insists bacteremia rule out endocarditis 2. New murmur 3. Paroxysmal atrial fibrillation 4. Metastatic prostate cancer on hormonal blocker agent and on Xtandi 5. Chronic foley with recurrent UTIs  Plan:  1. Cancel discharge and restart antibiotics with cefazolin 2. Obtain a 2D echocardiogram 3. If 2D echocardiogram fails to show evidence of a vegetation I think it would be more prudent to simply treat this gentleman was 6 weeks of IV cefazolin rather than hazard a transesophageal echocardiogram  Principal Problem:   Bacteremia, coagulase-negative staphylococcal Active Problems:   UTI (urinary tract infection)   Scheduled Meds: . amLODipine  10 mg Oral Daily  . docusate sodium  200 mg Oral BID  . enzalutamide  40 mg Oral Daily  . famotidine  20 mg Oral Daily  . furosemide  40 mg Oral Daily  . polyethylene glycol  17 g Oral Daily  . warfarin  2 mg Oral ONCE-1800  . Warfarin - Pharmacist Dosing Inpatient   Does not apply q1800   Continuous Infusions: .  ceFAZolin (ANCEF) IV     PRN Meds:.acetaminophen, bisacodyl, ondansetron **OR** ondansetron (ZOFRAN) IV, oxybutynin  HPI: Craig [name]. is a 84 y.o. male retired [employer] who has a past medical history significant for paroxysmal atrial fibrillation, history of intracranial hemorrhage, metastatic prostate cancer with metastases to bones which along with chronic weakness rendered him bedbound for several years now.  He was admitted to the hospital 7 September aft he had had extensive nausea and nonbloody vomiting and worsening lethargy with hypertension.  His wife who is herself a nurse activated EMS and the patient was evaluated and found to be having a low-grade fever to 100.4.  ER he was normotensive with low-grade temperature and  tachypneic.  He got blood cultures taken and was administered cefepime.  The suspicion was for urinary tract infection.  He does indeed have a chronic Foley catheter in place and UA was significant for 11-20 white blood cells.  Culture from the urine ultimately grew mixed flora but blood cultures have grown in both sites Staphylococcuslugdunensis.that is oxacillin sensitive.  Repeat blood cultures after he had been on cefepime showed clearance of his Craig [name].  He was notified about the patient by infectious disease pharmacy, Minh Pham, Pharm D who was concerned about pt being DC home on oral antibiotic.  While Craig [name] categories as a coagulase-negative Craig on conventions coagulase testing unlike staph epidermidis it is a highly virulent organism on par with Craig aureus.  Found in the blood it frequently invades into heart valve tissue and other site such as lumbar disc space.  Reach out to Dr. Maryland Pink and DC was cancelled. IV cefazolin to be started.  Will order 2D echocardiogram when I examined the patient he has a murmur which he says he never had before that is clearly audible at the left upper sternal border also at the PMI.  He does have documented aortic stenosis on echocardiogram there is not mention of a murmur and the most recent cardiology note.  Have other complaints besides which when she came into the hospital with.  He does have a chronic lesion on his toe but this seems fairly benign.  Review of Systems: Review of Systems  Constitutional: Positive for chills and  fever. Negative for diaphoresis, malaise/fatigue and weight loss.  HENT: Negative for congestion, hearing loss, sore throat and tinnitus.   Eyes: Negative for blurred vision and double vision.  Respiratory: Negative for cough, sputum production, shortness of breath and wheezing.   Cardiovascular: Negative for chest pain, palpitations and leg swelling.  Gastrointestinal:  Positive for abdominal pain, nausea and vomiting. Negative for blood in stool, constipation, heartburn and melena.  Genitourinary: Positive for hematuria. Negative for dysuria and flank pain.  Musculoskeletal: Positive for myalgias. Negative for back pain, falls and joint pain.  Skin: Negative for itching and rash.  Neurological: Positive for dizziness and weakness. Negative for sensory change, focal weakness, loss of consciousness and headaches.  Endo/Heme/Allergies: Does not bruise/bleed easily.  Psychiatric/Behavioral: Negative for depression, memory loss and suicidal ideas. The patient is not nervous/anxious.     Past Medical History:  Diagnosis Date  . Atrial fibrillation ([place])   . Chronic back pain   . GERD (gastroesophageal reflux disease)   . H/O hiatal hernia   . History of epistaxis 07/19/2002  . Hyperlipidemia   . Hypertension   . Hypertensive heart disease   . Hypokalemia   . ICH (intracerebral hemorrhage) ([place]) ~ 1999   after TPA/notes 09/27/2012  . Melanoma ([place])    "top of my head" (09/27/2012)  . Migraines    "migraines without headaches years ago" (09/27/2012)  . Osteoarthritis of both feet   . PAT (paroxysmal atrial tachycardia) ([place])   . Personal history of long-term (current) use of anticoagulants   . Pneumonia    "once; several years ago" (09/27/2012)  . Prostate cancer, primary, with metastasis from prostate to other site [facility])    on Lupron injections per GU  . S/P radiation therapy 02/13/14-02/27/14   SRS 5/5 spine completed 02/27/14  . S/P radiation therapy 05/27/14-05/31/14   right femur 20Gy/70fx  . Stroke [facility]) ~ 1999   ischemic / right cerebellar/posterior inferior cerebellar artery / right pos infarct    Social History   Tobacco Use  . Smoking status: Former Smoker    Years: 0.50    Types: Cigarettes    Last attempt to quit: 02/22/1953    Years since quitting: 64.7  . Smokeless tobacco: Never Used  Substance Use Topics  . Alcohol use: No    Comment:  09/27/2012 "glass of wine or 2/month"  . Drug use: No    Family History  Problem Relation Age of Onset  . Heart failure Mother   . Heart attack Father    Allergies  Allergen Reactions  . Pravachol Other (See Comments)    Muscle weakness  . Zocor [Simvastatin - High Dose] Other (See Comments)    Muscle weakness    OBJECTIVE: Blood pressure (!) 162/65, pulse (!) 58, temperature 98.4 F (36.9 C), temperature source Oral, resp. rate 16, height 6' (1.829 m), weight 89.8 kg, SpO2 93 %.  Physical Exam  Constitutional: He is oriented to person, place, and time. He appears well-developed and well-nourished. He is cooperative. He does not appear ill. No distress.  HENT:  Head: Normocephalic and atraumatic.  Right Ear: Hearing and external ear normal.  Left Ear: Hearing and external ear normal.  Nose: No rhinorrhea or nasal deformity. No epistaxis.  Eyes: Conjunctivae and EOM are normal. Right conjunctiva is not injected. Left conjunctiva is not injected. No scleral icterus.  Neck: Normal range of motion. Neck supple. No JVD present.  Cardiovascular: Normal rate, S1 normal and S2 normal. Exam reveals no friction  rub.  Murmur heard.  Systolic murmur is present with a grade of 3/6. Pulmonary/Chest: Effort normal. No stridor. No respiratory distress. He has no wheezes. He has no rales.  Abdominal: Soft. Normal appearance and bowel sounds are normal. He exhibits no distension and no ascites. There is no hepatosplenomegaly. There is no tenderness.  Musculoskeletal: Normal range of motion.       Right shoulder: Normal.       Left shoulder: Normal.       Right hip: Normal.       Left hip: Normal.       Right knee: Normal.       Left knee: Normal.  Lymphadenopathy:       Head (right side): No submandibular, no preauricular and no posterior auricular adenopathy present.       Head (left side): No submandibular, no preauricular and no posterior auricular adenopathy present.       Right  cervical: No superficial cervical and no deep cervical adenopathy present.      Left cervical: No superficial cervical and no deep cervical adenopathy present.  Neurological: He is alert and oriented to person, place, and time. He has normal strength. Gait normal.  Skin: Skin is warm, dry and intact. No abrasion, no bruising, no ecchymosis, no lesion and no rash noted. He is not diaphoretic. No cyanosis or erythema. No pallor. Nails show no clubbing.  Psychiatric: He has a normal mood and affect. His speech is normal and behavior is normal. Judgment and thought content normal. Cognition and memory are normal. He is attentive.   Skin lesion right toe that does not appear infected  Lab Results Lab Results  Component Value Date   WBC 9.0 11/01/2017   HGB 12.3 (L) 11/01/2017   HCT 38.3 (L) 11/01/2017   MCV 98.0 11/01/2017   PLT 139 (L) 11/01/2017    Lab Results  Component Value Date   CREATININE 1.25 (H) 11/01/2017   BUN 38 (H) 11/01/2017   NA 138 11/01/2017   K 4.2 11/01/2017   CL 104 11/01/2017   CO2 25 11/01/2017    Lab Results  Component Value Date   ALT 16 11/01/2017   AST 20 11/01/2017   ALKPHOS 59 11/01/2017   BILITOT 0.9 11/01/2017     Microbiology: Recent Results (from the past 240 hour(s))  Urine culture     Status: Abnormal   Collection Time: 10/29/17  6:03 PM  Result Value Ref Range Status   Specimen Description URINE, CATHETERIZED  Final   Special Requests   Final    NONE Performed at [facility], [place] [address], [place],  [address]    Culture MULTIPLE SPECIES PRESENT, SUGGEST RECOLLECTION (A)  Final   Report Status 10/31/2017 FINAL  Final  Culture, blood (Routine x 2)     Status: Abnormal   Collection Time: 10/29/17  6:19 PM  Result Value Ref Range Status   Specimen Description BLOOD RIGHT WRIST  Final   Special Requests   Final    BOTTLES DRAWN AEROBIC AND ANAEROBIC Blood Culture adequate volume   Culture  Setup Time   Final    GRAM  POSITIVE COCCI IN CLUSTERS IN BOTH AEROBIC AND ANAEROBIC BOTTLES CRITICAL RESULT CALLED TO, READ BACK BY AND VERIFIED WITH: A. MASTERS, PHARMD AT 1325 ON 10/30/17 BY C. JESSUP, MLT.    Culture (A)  Final    Craig [name] SUSCEPTIBILITIES PERFORMED ON PREVIOUS CULTURE WITHIN THE LAST 5 DAYS. Performed at [facility]  [facility], [place] [address], [place], [place] [address]    Report Status 11/01/2017 FINAL  Final  Blood Culture ID Panel (Reflexed)     Status: Abnormal   Collection Time: 10/29/17  6:19 PM  Result Value Ref Range Status   Enterococcus species NOT DETECTED NOT DETECTED Final   Listeria monocytogenes NOT DETECTED NOT DETECTED Final   Craig species DETECTED (A) NOT DETECTED Final    Comment: Methicillin (oxacillin) susceptible coagulase negative Craig. Possible blood culture contaminant (unless isolated from more than one blood culture draw or clinical case suggests pathogenicity). No antibiotic treatment is indicated for blood  culture contaminants. CRITICAL RESULT CALLED TO, READ BACK BY AND VERIFIED WITH: A. MASTERS, PHARMD AT 1325 ON 10/30/17 BY C. JESSUP, MLT.    Craig aureus NOT DETECTED NOT DETECTED Final   Methicillin resistance NOT DETECTED NOT DETECTED Final   Streptococcus species NOT DETECTED NOT DETECTED Final   Streptococcus agalactiae NOT DETECTED NOT DETECTED Final   Streptococcus pneumoniae NOT DETECTED NOT DETECTED Final   Streptococcus pyogenes NOT DETECTED NOT DETECTED Final   Acinetobacter baumannii NOT DETECTED NOT DETECTED Final   Enterobacteriaceae species NOT DETECTED NOT DETECTED Final   Enterobacter cloacae complex NOT DETECTED NOT DETECTED Final   Escherichia coli NOT DETECTED NOT DETECTED Final   Klebsiella oxytoca NOT DETECTED NOT DETECTED Final   Klebsiella pneumoniae NOT DETECTED NOT DETECTED Final   Proteus species NOT DETECTED NOT DETECTED Final   Serratia marcescens NOT DETECTED NOT DETECTED Final    Haemophilus influenzae NOT DETECTED NOT DETECTED Final   Neisseria meningitidis NOT DETECTED NOT DETECTED Final   Pseudomonas aeruginosa NOT DETECTED NOT DETECTED Final   Candida albicans NOT DETECTED NOT DETECTED Final   Candida glabrata NOT DETECTED NOT DETECTED Final   Candida krusei NOT DETECTED NOT DETECTED Final   Candida parapsilosis NOT DETECTED NOT DETECTED Final   Candida tropicalis NOT DETECTED NOT DETECTED Final    Comment: Performed at [facility], [address], [place], [place] [address]  Culture, blood (Routine x 2)     Status: Abnormal   Collection Time: 10/29/17  7:15 PM  Result Value Ref Range Status   Specimen Description BLOOD RIGHT FOREARM  Final   Special Requests   Final    BOTTLES DRAWN AEROBIC AND ANAEROBIC Blood Culture adequate volume   Culture  Setup Time   Final    GRAM POSITIVE COCCI IN BOTH AEROBIC AND ANAEROBIC BOTTLES CRITICAL RESULT CALLED TO, READ BACK BY AND VERIFIED WITH: A. MASTERS, PHARMD AT 1325 ON 10/30/17 BY C. JESSUP, MLT.    Culture Craig [name] (A)  Final   Report Status 11/01/2017 FINAL  Final   Organism ID, Bacteria Craig [name]  Final      Susceptibility   Craig [name] - MIC*    CIPROFLOXACIN <=0.5 SENSITIVE Sensitive     ERYTHROMYCIN <=0.25 SENSITIVE Sensitive     GENTAMICIN <=0.5 SENSITIVE Sensitive     OXACILLIN 1 SENSITIVE Sensitive     TETRACYCLINE <=1 SENSITIVE Sensitive     VANCOMYCIN <=0.5 SENSITIVE Sensitive     TRIMETH/SULFA <=10 SENSITIVE Sensitive     CLINDAMYCIN <=0.25 SENSITIVE Sensitive     RIFAMPIN <=0.5 SENSITIVE Sensitive     Inducible Clindamycin NEGATIVE Sensitive     * Craig [name]  Culture, blood (Routine X 2) w Reflex to ID Panel     Status: None (Preliminary result)   Collection Time: 10/30/17  2:48 PM  Result Value Ref  Range Status   Specimen Description BLOOD RIGHT ARM  Final   Special Requests   Final    BOTTLES DRAWN AEROBIC AND ANAEROBIC  Blood Culture results may not be optimal due to an inadequate volume of blood received in culture bottles   Culture   Final    NO GROWTH 2 DAYS Performed at [facility], [place] [address] [place], [place] [address]    Report Status PENDING  Incomplete  Culture, blood (Routine X 2) w Reflex to ID Panel     Status: None (Preliminary result)   Collection Time: 10/30/17  2:59 PM  Result Value Ref Range Status   Specimen Description BLOOD LEFT ARM  Final   Special Requests   Final    BOTTLES DRAWN AEROBIC AND ANAEROBIC Blood Culture adequate volume   Culture   Final    NO GROWTH 2 DAYS Performed at [facility], [place] [address], [place], [place] [address]    Report Status PENDING  Incomplete    Alcide Evener, [place] [facility] [place] [facility] [phone] pager  11/01/2017, 5:07 PM

## 2017-11-01 NOTE — Progress Notes (Signed)
[facility] PROGRESS NOTE  Craig [name]. 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 Wife (primary caretaker) activated EMS -- noted initial BP in 200s/100s with tachycardia to 120-160s. Low grade fever up to 100.4 at home. He has had about 3-4 admissions for UTI in the past 4 years since he has had the foley. Wife also reports that patient had not had a bowel movement in 3 days (usually has BM every 2-3 days).   Patient is found to have abnormal UA.  He was hospitalized for management of UTI.  Subsequently noted to have bacteremia.  Reason for Visit: Complicated UTI in the setting of indwelling Foley catheter  Consultants: None  Procedures: Replacement of Foley catheter while he was in the ED.  Antibiotics: Cefepime changed to Keflex. Cefazolin started 9/10  Subjective/Interval History: Complained of feeling poorly this morning.  Complains of nausea.  He was given antiemetics after a couple of hours he started feeling better.  He then tolerated his lunch.  Objective:  Vital Signs  Vitals:   10/31/17 1309 10/31/17 2222 11/01/17 0538 11/01/17 1510  BP: (!) 159/95 (!) 148/63 (!) 178/96 (!) 162/65  Pulse: 71 62 64 (!) 58  Resp: 18 18 16    Temp: 98.4 F (36.9 C) 98.4 F (36.9 C) 98.8 F (37.1 C) 98.4 F (36.9 C)  TempSrc: Oral Oral Oral Oral  SpO2: 93% 91% 91% 93%  Weight:   89.8 kg   Height:        Intake/Output Summary (Last 24 hours) at 11/01/2017 1614 Last data filed at 11/01/2017 2563 Gross per 24 hour  Intake 386 ml  Output 750 ml  Net -364 ml   Filed Weights   10/29/17 2316 10/30/17 0500 11/01/17 0538  Weight: 99.3 kg 90.1 kg 89.8 kg    General appearance: Alert.  In no  distress Resp: Clear to auscultation bilaterally.  No wheezing rales or rhonchi Cardio: S1-S2 is normal regular.  No S3-S4.  No rubs murmurs or bruit GI: Abdomen is soft.  Nontender.  Nondistended.  Bowel sounds are present.  No masses organomegaly Neurologic: Mildly distracted.  No obvious focal neurological deficits.  Lab Results:  Data Reviewed: I have personally reviewed following labs and imaging studies  CBC: Recent Labs  Lab 10/29/17 1819 10/30/17 0541 10/31/17 0657 11/01/17 0624  WBC 15.0* 14.0* 9.3 9.0  NEUTROABS 13.3* 10.9* 6.7 6.5  HGB 13.8 11.2* 11.6* 12.3*  HCT 42.5 35.2* 36.6* 38.3*  MCV 97.0 98.3 99.5 98.0  PLT 151 128* 125* 139*    Basic Metabolic Panel: Recent Labs  Lab 10/29/17 1819 10/30/17 0541 10/31/17 0657 11/01/17 0624  NA 139 140 140 138  K 4.8 4.0 4.3 4.2  CL 103 107 107 104  CO2 23 26 27 25   GLUCOSE 161* 116* 102* 126*  BUN 43* 46* 45* 38*  CREATININE 1.36* 1.38* 1.37* 1.25*  CALCIUM 10.1 9.3 9.2 9.3  MG  --  1.9 2.0 2.2    GFR: Estimated Creatinine Clearance: 43.1 mL/min (A) (by C-G formula based on SCr of 1.25 mg/dL (H)).  Liver Function Tests: Recent Labs  Lab 10/29/17 1819 11/01/17 0624  AST 30 20  ALT 12 16  ALKPHOS 55 59  BILITOT 1.5* 0.9  PROT 6.6 6.1*  ALBUMIN 3.1*  2.5*    Coagulation Profile: Recent Labs  Lab 10/29/17 1819 10/30/17 0541 10/31/17 0657 11/01/17 0624  INR 1.74 1.84 2.35 2.26     Recent Results (from the past 240 hour(s))  Urine culture     Status: Abnormal   Collection Time: 10/29/17  6:03 PM  Result Value Ref Range Status   Specimen Description URINE, CATHETERIZED  Final   Special Requests   Final    NONE Performed at [facility], [place] [address], [place], [place] [address]    Culture MULTIPLE SPECIES PRESENT, SUGGEST RECOLLECTION (A)  Final   Report Status 10/31/2017 FINAL  Final  Culture, blood (Routine x 2)     Status: Abnormal   Collection Time: 10/29/17  6:19 PM  Result  Value Ref Range Status   Specimen Description BLOOD RIGHT WRIST  Final   Special Requests   Final    BOTTLES DRAWN AEROBIC AND ANAEROBIC Blood Culture adequate volume   Culture  Setup Time   Final    GRAM POSITIVE COCCI IN CLUSTERS IN BOTH AEROBIC AND ANAEROBIC BOTTLES CRITICAL RESULT CALLED TO, READ BACK BY AND VERIFIED WITH: A. MASTERS, PHARMD AT 1325 ON 10/30/17 BY C. JESSUP, MLT.    Culture (A)  Final    STAPHYLOCOCCUS LUGDUNENSIS SUSCEPTIBILITIES PERFORMED ON PREVIOUS CULTURE WITHIN THE LAST 5 DAYS. Performed at [facility], [place] [address], [place], [place] [address]    Report Status 11/01/2017 FINAL  Final  Blood Culture ID Panel (Reflexed)     Status: Abnormal   Collection Time: 10/29/17  6:19 PM  Result Value Ref Range Status   Enterococcus species NOT DETECTED NOT DETECTED Final   Listeria monocytogenes NOT DETECTED NOT DETECTED Final   Staphylococcus species DETECTED (A) NOT DETECTED Final    Comment: Methicillin (oxacillin) susceptible coagulase negative staphylococcus. Possible blood culture contaminant (unless isolated from more than one blood culture draw or clinical case suggests pathogenicity). No antibiotic treatment is indicated for blood  culture contaminants. CRITICAL RESULT CALLED TO, READ BACK BY AND VERIFIED WITH: A. MASTERS, PHARMD AT 1325 ON 10/30/17 BY C. JESSUP, MLT.    Staphylococcus aureus NOT DETECTED NOT DETECTED Final   Methicillin resistance NOT DETECTED NOT DETECTED Final   Streptococcus species NOT DETECTED NOT DETECTED Final   Streptococcus agalactiae NOT DETECTED NOT DETECTED Final   Streptococcus pneumoniae NOT DETECTED NOT DETECTED Final   Streptococcus pyogenes NOT DETECTED NOT DETECTED Final   Acinetobacter baumannii NOT DETECTED NOT DETECTED Final   Enterobacteriaceae species NOT DETECTED NOT DETECTED Final   Enterobacter cloacae complex NOT DETECTED NOT DETECTED Final   Escherichia coli NOT DETECTED NOT DETECTED Final   Klebsiella  oxytoca NOT DETECTED NOT DETECTED Final   Klebsiella pneumoniae NOT DETECTED NOT DETECTED Final   Proteus species NOT DETECTED NOT DETECTED Final   Serratia marcescens NOT DETECTED NOT DETECTED Final   Haemophilus influenzae NOT DETECTED NOT DETECTED Final   Neisseria meningitidis NOT DETECTED NOT DETECTED Final   Pseudomonas aeruginosa NOT DETECTED NOT DETECTED Final   Candida albicans NOT DETECTED NOT DETECTED Final   Candida glabrata NOT DETECTED NOT DETECTED Final   Candida krusei NOT DETECTED NOT DETECTED Final   Candida parapsilosis NOT DETECTED NOT DETECTED Final   Candida tropicalis NOT DETECTED NOT DETECTED Final    Comment: Performed at [facility], [address], [place], [place] [address]  Culture, blood (Routine x 2)     Status: Abnormal   Collection Time: 10/29/17  7:15 PM  Result Value Ref Range Status   Specimen Description BLOOD RIGHT FOREARM  Final   Special Requests   Final    BOTTLES DRAWN AEROBIC AND ANAEROBIC Blood Culture adequate volume   Culture  Setup Time   Final    GRAM POSITIVE COCCI IN BOTH AEROBIC AND ANAEROBIC BOTTLES CRITICAL RESULT CALLED TO, READ BACK BY AND VERIFIED WITH: A. MASTERS, PHARMD AT 1325 ON 10/30/17 BY C. JESSUP, MLT.    Culture STAPHYLOCOCCUS LUGDUNENSIS (A)  Final   Report Status 11/01/2017 FINAL  Final   Organism ID, Bacteria STAPHYLOCOCCUS LUGDUNENSIS  Final      Susceptibility   Staphylococcus lugdunensis - MIC*    CIPROFLOXACIN <=0.5 SENSITIVE Sensitive     ERYTHROMYCIN <=0.25 SENSITIVE Sensitive     GENTAMICIN <=0.5 SENSITIVE Sensitive     OXACILLIN 1 SENSITIVE Sensitive     TETRACYCLINE <=1 SENSITIVE Sensitive     VANCOMYCIN <=0.5 SENSITIVE Sensitive     TRIMETH/SULFA <=10 SENSITIVE Sensitive     CLINDAMYCIN <=0.25 SENSITIVE Sensitive     RIFAMPIN <=0.5 SENSITIVE Sensitive     Inducible Clindamycin NEGATIVE Sensitive     * STAPHYLOCOCCUS LUGDUNENSIS  Culture, blood (Routine X 2) w Reflex to ID Panel     Status: None  (Preliminary result)   Collection Time: 10/30/17  2:48 PM  Result Value Ref Range Status   Specimen Description BLOOD RIGHT ARM  Final   Special Requests   Final    BOTTLES DRAWN AEROBIC AND ANAEROBIC Blood Culture results may not be optimal due to an inadequate volume of blood received in culture bottles   Culture   Final    NO GROWTH 2 DAYS Performed at [place] [address] [place], [place] [address]    Report Status PENDING  Incomplete  Culture, blood (Routine X 2) w Reflex to ID Panel     Status: None (Preliminary result)   Collection Time: 10/30/17  2:59 PM  Result Value Ref Range Status   Specimen Description BLOOD LEFT ARM  Final   Special Requests   Final    BOTTLES DRAWN AEROBIC AND ANAEROBIC Blood Culture adequate volume   Culture   Final    NO GROWTH 2 DAYS Performed at [facility], [place] [address], [place], [place] [address]    Report Status PENDING  Incomplete      Radiology Studies: No results found.   Medications:  Scheduled: . amLODipine  10 mg Oral Daily  . docusate sodium  200 mg Oral BID  . enzalutamide  40 mg Oral Daily  . famotidine  20 mg Oral Daily  . furosemide  40 mg Oral Daily  . polyethylene glycol  17 g Oral Daily  . warfarin  2 mg Oral ONCE-1800  . Warfarin - Pharmacist Dosing Inpatient   Does not apply q1800   Continuous: .  ceFAZolin (ANCEF) IV     ATF:[id number], bisacodyl, ondansetron **OR** ondansetron (ZOFRAN) IV, oxybutynin  Assessment/Plan:   STAPH LUGDUNENSIS Bacteremia 2 sets of blood culture positive for coag negative staph.  Subsequent cultures were negative.  However the initial culture was reported as STAPH LUGDUNENSIS.  Called by Dr. Drucilla Schmidt with infectious disease who mentioned that this can be a particularly virulent organism.  This is despite the subsequent cultures being negative.  He recommends keeping the patient on IV antibiotics in the form of cefazolin.  Discussed with the patient, his wife  and daughter.  Discharge has been canceled.  Wait for ID to formally consult.  UTI in the setting of chronic Foley/Sepsis. Patient had elevated WBC.  UA was positive.  He was experiencing nausea and vomiting.  Previous history of UTI.  He was placed on cefepime which will be continued.  Urine culture showed multiple species.  Sample will be sent again. Foley catheter was changed in the emergency department.  He is followed by Dr. Junious Silk with urology as an outpatient.  Previous culture data reviewed.  Patient's urine culture grew Klebsiella and E. coli in 2017.  Sensitivities reviewed.  Initially we changed him to cephalexin.  However please see discussion under bacteremia.    Nausea Etiology unclear.  Could be due to the above.  LFTs normal.  Patient felt better after he was given symptomatic treatment.  History of previous stroke and dysphagia Aspiration precautions.  According to his wife for patient's diet has been liberalized for pleasure feeds.  Continue to watch closely.  History of chronic diastolic CHF Appears to be well compensated currently.  Continue current medications including Lasix.  Strict ins and outs.  Daily weights.  Potassium level is normal.  Creatinine is stable.  History of essential hypertension Patient's amlodipine was held at admission.  Blood pressure noted to be elevated.  This was resumed.  Continue to monitor.  Metastatic prostate cancer Continue home medications.  History of constipation Usually has a bowel movement every 3 days.  Continue bowel regimen.  History of paroxysmal atrial fibrillation on Coumadin Warfarin per pharmacy.  Not noted to be on any rate limiting drugs at home.  Chronic kidney disease stage III Renal function is at baseline.   DVT Prophylaxis: On warfarin    Code Status: Full code Family Communication: Discussed with the patient and his wife Disposition Plan: Management as outlined above.  IV cefazolin initiated today.  May need  further work-up depending on ID input.    LOS: 1 day   [facility] Pager [phone] 11/01/2017, 4:14 PM  If 7PM-7AM, please contact night-coverage at [email or web address], password [facility]

## 2017-11-01 NOTE — Discharge Summary (Deleted)
[facility]  Physician Discharge Summary   Patient ID: Craig [name]. MRN: [id number] DOB/AGE: Aug 25, 1924 84 y.o.  Admit date: 10/29/2017 Discharge date: 11/01/2017  PCP: No primary care provider on file.  DISCHARGE DIAGNOSES:  Acute urinary tract infection in the setting of chronic Foley with sepsis, improved Coagulase-negative staph bacteremia, likely contaminant History of stroke and dysphagia History of chronic diastolic CHF History of essential hypertension Metastatic prostate cancer History of paroxysmal atrial fibrillation on anticoagulation   RECOMMENDATIONS FOR OUTPATIENT FOLLOW UP: 1. Outpatient follow-up with PCP in 1 to 2 weeks 2. 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 Wife (primary caretaker) activated EMS -- noted initial BP in 200s/100s with tachycardia to 120-160s. Low grade fever up to 100.4 at home. He has had about 3-4 admissions for UTI in the past 4 years since he has had the foley. Wife also reports that patient had not had a bowel movement in 3 days (usually has BM every 2-3 days).  Patient is found to have abnormal UA.  He was hospitalized for management of UTI.  Subsequently noted to have bacteremia.   HOSPITAL COURSE:   UTI in the setting of chronic Foley/Sepsis. Patient had elevated WBC.  UA was positive.  He was experiencing nausea and vomiting.  Previous history of UTI.  He was placed on cefepime which will be continued.   Patient with sepsis at presentation. Now better.Urine culture showed multiple species.  Another sample was sent which is pending.   Patient has been feeling better.  Foley catheter was changed in the emergency department. He is followed by Dr. Junious Silk with urology as an outpatient.  Previous culture data reviewed.  Patient's urine culture grew Klebsiella and E. coli in 2017.    Patient was changed over to Keflex.  Coagulase-negative staph bacteremia 2 sets of blood culture positive for coag negative staph.  However patient remains afebrile.  This is an unusual situation.  Clinically this does not appear to be a true infection but 2 sets of positive culture is very unusual.  They both could be contaminants.  Another set of blood culture was repeated.  Repeat cultures have been negative.  So it does appear that the initial 2 were contaminants.  Discussed in detail with patient's family.  No need for treatment at this time.  History of previous stroke and dysphagia According to his wife for patient's diet has been liberalized for pleasure feeds.    History of chronic diastolic CHF Appears to be well compensated currently.    Continue current management.  History of essential hypertension Continue home medications  Metastatic prostate cancer Continue home medications.  History of constipation Usually has a bowel movement every 3 days.   Had a bowel movement on Sunday.  History of paroxysmal atrial fibrillation on Coumadin Warfarin per pharmacy.    Per wife his INR is usually maintained between 1.8-2.2.Marland Kitchen  Chronic kidney disease stage III Renal function is at baseline.  Overall stable.  Patient did have some nausea this morning which was relieved with Zofran.  He tolerated his lunch.  He was sent in the chair.  He feels better.  Okay for discharge.  PERTINENT LABS:  The results of significant diagnostics from this hospitalization (including imaging, microbiology, ancillary and laboratory) are listed below for reference.    Microbiology: Recent Results (from the past 240 hour(s))  Urine culture      Status: Abnormal   Collection Time: 10/29/17  6:03 PM  Result Value Ref Range Status   Specimen Description URINE, CATHETERIZED  Final   Special Requests   Final    NONE Performed at [facility], [address], [place], [place] [address]    Culture MULTIPLE SPECIES PRESENT, SUGGEST RECOLLECTION (A)  Final   Report Status 10/31/2017 FINAL  Final  Culture, blood (Routine x 2)     Status: Abnormal   Collection Time: 10/29/17  6:19 PM  Result Value Ref Range Status   Specimen Description BLOOD RIGHT WRIST  Final   Special Requests   Final    BOTTLES DRAWN AEROBIC AND ANAEROBIC Blood Culture adequate volume   Culture  Setup Time   Final    GRAM POSITIVE COCCI IN CLUSTERS IN BOTH AEROBIC AND ANAEROBIC BOTTLES CRITICAL RESULT CALLED TO, READ BACK BY AND VERIFIED WITH: A. MASTERS, PHARMD AT 1325 ON 10/30/17 BY C. JESSUP, MLT.    Culture (A)  Final    STAPHYLOCOCCUS LUGDUNENSIS SUSCEPTIBILITIES PERFORMED ON PREVIOUS CULTURE WITHIN THE LAST 5 DAYS. Performed at [facility], [place] [address] [place], [place] [address]    Report Status 11/01/2017 FINAL  Final  Blood Culture ID Panel (Reflexed)     Status: Abnormal   Collection Time: 10/29/17  6:19 PM  Result Value Ref Range Status   Enterococcus species NOT DETECTED NOT DETECTED Final   Listeria monocytogenes NOT DETECTED NOT DETECTED Final   Staphylococcus species DETECTED (A) NOT DETECTED Final    Comment: Methicillin (oxacillin) susceptible coagulase negative staphylococcus. Possible blood culture contaminant (unless isolated from more than one blood culture draw or clinical case suggests pathogenicity). No antibiotic treatment is indicated for blood  culture contaminants. CRITICAL RESULT CALLED TO, READ BACK BY AND VERIFIED WITH: A. MASTERS, PHARMD AT 1325 ON 10/30/17 BY C. JESSUP, MLT.    Staphylococcus aureus NOT DETECTED NOT DETECTED Final   Methicillin resistance NOT DETECTED NOT DETECTED Final   Streptococcus species  NOT DETECTED NOT DETECTED Final   Streptococcus agalactiae NOT DETECTED NOT DETECTED Final   Streptococcus pneumoniae NOT DETECTED NOT DETECTED Final   Streptococcus pyogenes NOT DETECTED NOT DETECTED Final   Acinetobacter baumannii NOT DETECTED NOT DETECTED Final   Enterobacteriaceae species NOT DETECTED NOT DETECTED Final   Enterobacter cloacae complex NOT DETECTED NOT DETECTED Final   Escherichia coli NOT DETECTED NOT DETECTED Final   Klebsiella oxytoca NOT DETECTED NOT DETECTED Final   Klebsiella pneumoniae NOT DETECTED NOT DETECTED Final   Proteus species NOT DETECTED NOT DETECTED Final   Serratia marcescens NOT DETECTED NOT DETECTED Final   Haemophilus influenzae NOT DETECTED NOT DETECTED Final   Neisseria meningitidis NOT DETECTED NOT DETECTED Final   Pseudomonas aeruginosa NOT DETECTED NOT DETECTED Final   Candida albicans NOT DETECTED NOT DETECTED Final   Candida glabrata NOT DETECTED NOT DETECTED Final   Candida krusei NOT DETECTED NOT DETECTED Final   Candida parapsilosis NOT DETECTED NOT DETECTED Final   Candida tropicalis NOT DETECTED NOT DETECTED Final    Comment: Performed at [facility], [address], [place], [place] [address]  Culture, blood (Routine x 2)     Status: Abnormal   Collection Time: 10/29/17  7:15  PM  Result Value Ref Range Status   Specimen Description BLOOD RIGHT FOREARM  Final   Special Requests   Final    BOTTLES DRAWN AEROBIC AND ANAEROBIC Blood Culture adequate volume   Culture  Setup Time   Final    GRAM POSITIVE COCCI IN BOTH AEROBIC AND ANAEROBIC BOTTLES CRITICAL RESULT CALLED TO, READ BACK BY AND VERIFIED WITH: A. MASTERS, PHARMD AT 1325 ON 10/30/17 BY C. JESSUP, MLT.    Culture STAPHYLOCOCCUS LUGDUNENSIS (A)  Final   Report Status 11/01/2017 FINAL  Final   Organism ID, Bacteria STAPHYLOCOCCUS LUGDUNENSIS  Final      Susceptibility   Staphylococcus lugdunensis - MIC*    CIPROFLOXACIN <=0.5 SENSITIVE Sensitive     ERYTHROMYCIN <=0.25  SENSITIVE Sensitive     GENTAMICIN <=0.5 SENSITIVE Sensitive     OXACILLIN 1 SENSITIVE Sensitive     TETRACYCLINE <=1 SENSITIVE Sensitive     VANCOMYCIN <=0.5 SENSITIVE Sensitive     TRIMETH/SULFA <=10 SENSITIVE Sensitive     CLINDAMYCIN <=0.25 SENSITIVE Sensitive     RIFAMPIN <=0.5 SENSITIVE Sensitive     Inducible Clindamycin NEGATIVE Sensitive     * STAPHYLOCOCCUS LUGDUNENSIS  Culture, blood (Routine X 2) w Reflex to ID Panel     Status: None (Preliminary result)   Collection Time: 10/30/17  2:48 PM  Result Value Ref Range Status   Specimen Description BLOOD RIGHT ARM  Final   Special Requests   Final    BOTTLES DRAWN AEROBIC AND ANAEROBIC Blood Culture results may not be optimal due to an inadequate volume of blood received in culture bottles   Culture   Final    NO GROWTH 2 DAYS Performed at [facility] [address], [place], [place] [address]    Report Status PENDING  Incomplete  Culture, blood (Routine X 2) w Reflex to ID Panel     Status: None (Preliminary result)   Collection Time: 10/30/17  2:59 PM  Result Value Ref Range Status   Specimen Description BLOOD LEFT ARM  Final   Special Requests   Final    BOTTLES DRAWN AEROBIC AND ANAEROBIC Blood Culture adequate volume   Culture   Final    NO GROWTH 2 DAYS Performed at [facility], [place] [address], [place], [place] [address]    Report Status PENDING  Incomplete     Labs: Basic Metabolic Panel: Recent Labs  Lab 10/29/17 1819 10/30/17 0541 10/31/17 0657 11/01/17 0624  NA 139 140 140 138  K 4.8 4.0 4.3 4.2  CL 103 107 107 104  CO2 23 26 27 25   GLUCOSE 161* 116* 102* 126*  BUN 43* 46* 45* 38*  CREATININE 1.36* 1.38* 1.37* 1.25*  CALCIUM 10.1 9.3 9.2 9.3  MG  --  1.9 2.0 2.2   Liver Function Tests: Recent Labs  Lab 10/29/17 1819 11/01/17 0624  AST 30 20  ALT 12 16  ALKPHOS 55 59  BILITOT 1.5* 0.9  PROT 6.6 6.1*  ALBUMIN 3.1* 2.5*   CBC: Recent Labs  Lab 10/29/17 1819 10/30/17 0541  10/31/17 0657 11/01/17 0624  WBC 15.0* 14.0* 9.3 9.0  NEUTROABS 13.3* 10.9* 6.7 6.5  HGB 13.8 11.2* 11.6* 12.3*  HCT 42.5 35.2* 36.6* 38.3*  MCV 97.0 98.3 99.5 98.0  PLT 151 128* 125* 139*    IMAGING STUDIES Dg Chest Port 1 View  Result Date: 10/29/2017 CLINICAL DATA:  Altered mental status and fever. Ex-smoker. EXAM: PORTABLE CHEST 1 VIEW COMPARISON:  07/17/2014. FINDINGS: Stable enlarged cardiac silhouette and tortuous and calcified thoracic aorta. The interstitial markings remain mildly prominent. Stable small amount of linear scarring in the right mid lung zone. Thoracic spine degenerative changes. IMPRESSION: Stable cardiomegaly and mild chronic interstitial lung disease. No acute abnormality. Electronically Signed   By: Claudie Revering M.D.   On: 10/29/2017 19:46    DISCHARGE EXAMINATION: Vitals:   10/31/17 0514 10/31/17 1309 10/31/17 2222 11/01/17 0538  BP: (!) 171/97 (!) 159/95 (!) 148/63 (!) 178/96  Pulse: 69 71 62 64  Resp: 19 18 18 16   Temp: 98.4 F (36.9 C) 98.4 F (36.9 C) 98.4 F (36.9 C) 98.8 F (37.1 C)  TempSrc: Oral Oral Oral Oral  SpO2: 90% 93% 91% 91%  Weight:    89.8 kg  Height:       General appearance: alert, cooperative, appears stated age and no distress Resp: clear to auscultation bilaterally Cardio: regular rate and rhythm, S1, S2 normal, no murmur, click, rub or gallop GI: soft, non-tender; bowel sounds normal; no masses,  no organomegaly  DISPOSITION: Home with family  Discharge Instructions    Call MD for:  difficulty breathing, headache or visual disturbances   Complete by:  As directed    Call MD for:  extreme fatigue   Complete by:  As directed    Call MD for:  hives   Complete by:  As directed    Call MD for:  persistant dizziness or light-headedness   Complete by:  As directed    Call MD for:  persistant nausea and vomiting   Complete by:  As directed    Call MD for:  severe uncontrolled pain   Complete by:  As directed    Call MD  for:  temperature >100.4   Complete by:  As directed    Discharge instructions   Complete by:  As directed    Please follow-up with your primary care provider within 1 to 2 weeks.  Follow-up with urologist as needed.  Seek attention if symptoms recur.  Diet as before.  You were cared for by a hospitalist during your hospital stay. If you have any questions about your discharge medications or the care you received while you were in the hospital after you are discharged, you can call the unit and asked to speak with the hospitalist on call if the hospitalist that took care of you is not available. Once you are discharged, your primary care physician will handle any further medical issues. Please note that NO REFILLS for any discharge medications will be authorized once you are discharged, as it is imperative that you return to your primary care physician (or establish a relationship with a primary care physician if you do not have one) for your aftercare needs so that they can reassess your need for medications and monitor your lab values. If you do not have a primary care physician, you can call [phone] for a physician referral.   Increase activity slowly   Complete by:  As directed         Allergies as of 11/01/2017      Reactions   Pravachol Other (See Comments)   Muscle weakness   Zocor [simvastatin - High Dose] Other (See Comments)   Muscle weakness      Medication List    TAKE these medications   acetaminophen 500 MG tablet Commonly known as:  TYLENOL Take 1,000 mg by mouth daily.   amLODipine 10 MG tablet Commonly known  as:  NORVASC Take 1 tablet (10 mg total) by mouth daily.   atorvastatin 10 MG tablet Commonly known as:  LIPITOR Take 1 tablet (10 mg total) by mouth daily.   B-complex with vitamin C tablet Take 1 tablet by mouth daily.   cephALEXin 500 MG capsule Commonly known as:  KEFLEX Take 1 capsule (500 mg total) by mouth every 8 (eight) hours for 5 days.     docusate sodium 100 MG capsule Commonly known as:  COLACE Take 100 mg by mouth 2 (two) times daily.   ezetimibe 10 MG tablet Commonly known as:  ZETIA Take 1 tablet (10 mg total) by mouth daily.   FISH OIL PO Take 1 capsule by mouth daily. ( Mega Red )   furosemide 40 MG tablet Commonly known as:  LASIX Take 1 tablet (40 mg total) by mouth daily.   losartan 100 MG tablet Commonly known as:  COZAAR Take 1 tablet (100 mg total) by mouth daily.   LUPRON DEPOT IM Inject 1 each into the muscle every 6 (six) months.   multivitamin per tablet Take 1 tablet by mouth daily. ( with B - Complex )   ondansetron 4 MG disintegrating tablet Commonly known as:  ZOFRAN-ODT Take 1 tablet (4 mg total) by mouth every 8 (eight) hours as needed for nausea or vomiting.   oxybutynin 5 MG tablet Commonly known as:  DITROPAN Take 5 mg by mouth daily as needed for bladder spasms.   PROBIOTIC ADVANCED PO Take 1 capsule by mouth daily.   ranitidine 150 MG tablet Commonly known as:  ZANTAC Take 150 mg by mouth at bedtime.   warfarin 5 MG tablet Commonly known as:  COUMADIN Take as directed. If you are unsure how to take this medication, talk to your nurse or doctor. Original instructions:  TAKE 1 TABLET BY MOUTH AS DIRECTED BY THE COUMADIN CLINIC What changed:    how much to take  how to take this  when to take this  additional instructions   XGEVA [place] Inject into the skin every 30 (thirty) days.   XTANDI 40 MG capsule Generic drug:  enzalutamide TAKE 1 CAPSULE (40 MG) BY MOUTH ONCE DAILY What changed:  See the new instructions.          TOTAL DISCHARGE TIME: 35 minutes  Bonnielee Haff  [facility] Pager [phone]  11/01/2017, 2:30 PM

## 2017-11-01 NOTE — Progress Notes (Addendum)
ANTICOAGULATION CONSULT NOTE - Follow-up Consult  Pharmacy Consult for Coumadin Indication: atrial fibrillation  Allergies  Allergen Reactions  . Pravachol Other (See Comments)    Muscle weakness  . Zocor [Simvastatin - High Dose] Other (See Comments)    Muscle weakness    Patient Measurements: Height: 6' (182.9 cm) Weight: 197 lb 15.6 oz (89.8 kg) IBW/kg (Calculated) : 77.6  Vital Signs: Temp: 98.8 F (37.1 C) (09/10 0538) Temp Source: Oral (09/10 0538) BP: 178/96 (09/10 0538) Pulse Rate: 64 (09/10 0538)  Labs: Recent Labs    10/30/17 0541 10/31/17 0657 11/01/17 0624  HGB 11.2* 11.6* 12.3*  HCT 35.2* 36.6* 38.3*  PLT 128* 125* 139*  LABPROT 21.1* 25.6* 24.8*  INR 1.84 2.35 2.26  CREATININE 1.38* 1.37* 1.25*    Estimated Creatinine Clearance: 43.1 mL/min (A) (by C-G formula based on SCr of 1.25 mg/dL (H)).   Assessment: 84 yo M on Coumadin 2.5mg  daily except for 5mg  on Mon/Thurs/Sat for hx Afib, hx CVA/ICH in 1999. INR slighlty supratherapeutic (2.26) for new lower INR goal of 1.8-2.2. Hgb 12.3, plts slightly low but stable.  Goal of Therapy:  INR 1.8-2.2 per MD Monitor platelets by anticoagulation protocol: Yes   Plan:  Give Coumadin 2 mg PO tonight Monitor daily INR, CBC, s/s of bleed  Sherlon Handing, PharmD, BCPS Clinical pharmacist  **Pharmacist phone directory can now be found on [email or web address] ([email or web address]).  Listed under [place].  11/01/2017,7:56 AM

## 2017-11-01 NOTE — Social Work (Signed)
CSW called [facility] for discharge home per pt's and wife's request.  [name], [place] Work [phone]

## 2017-11-02 ENCOUNTER — Telehealth: Payer: Self-pay | Admitting: Oncology

## 2017-11-02 ENCOUNTER — Inpatient Hospital Stay (HOSPITAL_COMMUNITY): Payer: Medicare Other

## 2017-11-02 ENCOUNTER — Inpatient Hospital Stay: Payer: Self-pay

## 2017-11-02 DIAGNOSIS — I358 Other nonrheumatic aortic valve disorders: Secondary | ICD-10-CM

## 2017-11-02 DIAGNOSIS — I38 Endocarditis, valve unspecified: Secondary | ICD-10-CM

## 2017-11-02 DIAGNOSIS — T83511A Infection and inflammatory reaction due to indwelling urethral catheter, initial encounter: Principal | ICD-10-CM

## 2017-11-02 DIAGNOSIS — I351 Nonrheumatic aortic (valve) insufficiency: Secondary | ICD-10-CM

## 2017-11-02 DIAGNOSIS — N390 Urinary tract infection, site not specified: Secondary | ICD-10-CM

## 2017-11-02 LAB — CBC WITH DIFFERENTIAL/PLATELET
Abs Immature Granulocytes: 0 10*3/uL (ref 0.0–0.1)
Basophils Absolute: 0 10*3/uL (ref 0.0–0.1)
Basophils Relative: 1 %
EOS ABS: 0.3 10*3/uL (ref 0.0–0.7)
EOS PCT: 4 %
HCT: 36.9 % — ABNORMAL LOW (ref 39.0–52.0)
Hemoglobin: 11.9 g/dL — ABNORMAL LOW (ref 13.0–17.0)
Immature Granulocytes: 0 %
Lymphocytes Relative: 17 %
Lymphs Abs: 1.3 10*3/uL (ref 0.7–4.0)
MCH: 31.2 pg (ref 26.0–34.0)
MCHC: 32.2 g/dL (ref 30.0–36.0)
MCV: 96.6 fL (ref 78.0–100.0)
MONO ABS: 0.8 10*3/uL (ref 0.1–1.0)
Monocytes Relative: 11 %
Neutro Abs: 5 10*3/uL (ref 1.7–7.7)
Neutrophils Relative %: 67 %
Platelets: 144 10*3/uL — ABNORMAL LOW (ref 150–400)
RBC: 3.82 MIL/uL — ABNORMAL LOW (ref 4.22–5.81)
RDW: 14 % (ref 11.5–15.5)
WBC: 7.5 10*3/uL (ref 4.0–10.5)

## 2017-11-02 LAB — BASIC METABOLIC PANEL
Anion gap: 8 (ref 5–15)
BUN: 31 mg/dL — AB (ref 8–23)
CALCIUM: 9.2 mg/dL (ref 8.9–10.3)
CHLORIDE: 104 mmol/L (ref 98–111)
CO2: 26 mmol/L (ref 22–32)
Creatinine, Ser: 1.21 mg/dL (ref 0.61–1.24)
GFR calc Af Amer: 59 mL/min — ABNORMAL LOW (ref >60)
GFR calc non Af Amer: 51 mL/min — ABNORMAL LOW (ref >60)
GLUCOSE: 114 mg/dL — AB (ref 70–99)
Potassium: 4.1 mmol/L (ref 3.5–5.1)
Sodium: 138 mmol/L (ref 135–145)

## 2017-11-02 LAB — MAGNESIUM: Magnesium: 2 mg/dL (ref 1.7–2.4)

## 2017-11-02 LAB — PROTIME-INR
INR: 2.6
Prothrombin Time: 27.7 seconds — ABNORMAL HIGH (ref 11.4–15.2)

## 2017-11-02 LAB — URINE CULTURE: Culture: NO GROWTH

## 2017-11-02 LAB — ECHOCARDIOGRAM COMPLETE
Height: 72 in
Weight: 3195.79 oz

## 2017-11-02 MED ORDER — SODIUM CHLORIDE 0.9% FLUSH: 10.0000 mL | INTRAVENOUS | Status: DC | PRN

## 2017-11-02 MED ORDER — SODIUM CHLORIDE 0.9% FLUSH
10.0000 mL | Freq: Two times a day (BID) | INTRAVENOUS | Status: DC
  Administered 2017-11-02: 10 mL

## 2017-11-02 NOTE — Telephone Encounter (Signed)
Called patient regarding 10/8 per MD ok

## 2017-11-02 NOTE — Progress Notes (Signed)
[facility] will provide [facility] services at [place] to support home IV ABX.   If patient discharges after hours, please call [phone].   Larry Sierras 11/02/2017, 1:51 PM

## 2017-11-02 NOTE — Progress Notes (Signed)
Subjective:  She sustained bruises today while trying to be moved.  Antibiotics:  Anti-infectives (From admission, onward)   Start     Dose/Rate Route Frequency Ordered Stop   11/01/17 2200  ceFAZolin (ANCEF) IVPB 1 g/50 mL premix  Status:  Discontinued     1 g 100 mL/hr over 30 Minutes Intravenous Every 8 hours 11/01/17 1609 11/01/17 1609   11/01/17 2200  ceFAZolin (ANCEF) IVPB 2g/100 mL premix     2 g 200 mL/hr over 30 Minutes Intravenous Every 8 hours 11/01/17 1609     11/01/17 1400  cephALEXin (KEFLEX) capsule 500 mg  Status:  Discontinued     500 mg Oral Every 8 hours 11/01/17 1334 11/01/17 1609   11/01/17 0000  cephALEXin (KEFLEX) 500 MG capsule     500 mg Oral Every 8 hours 11/01/17 1430 11/06/17 2359   10/31/17 0800  ceFEPIme (MAXIPIME) 2 g in sodium chloride 0.9 % 100 mL IVPB  Status:  Discontinued     2 g 200 mL/hr over 30 Minutes Intravenous Every 24 hours 10/30/17 1413 11/01/17 1334   10/30/17 0800  ceFEPIme (MAXIPIME) 2 g in sodium chloride 0.9 % 100 mL IVPB  Status:  Discontinued     2 g 200 mL/hr over 30 Minutes Intravenous Every 12 hours 10/29/17 2150 10/30/17 1413   10/29/17 2015  ceFEPIme (MAXIPIME) 2 g in sodium chloride 0.9 % 100 mL IVPB     2 g 200 mL/hr over 30 Minutes Intravenous  Once 10/29/17 2009 10/29/17 2152      Medications: Scheduled Meds: . amLODipine  10 mg Oral Daily  . docusate sodium  200 mg Oral BID  . enzalutamide  40 mg Oral Daily  . famotidine  20 mg Oral Daily  . furosemide  40 mg Oral Daily  . polyethylene glycol  17 g Oral Daily  . Warfarin - Pharmacist Dosing Inpatient   Does not apply q1800   Continuous Infusions: .  ceFAZolin (ANCEF) IV 2 g (11/02/17 1437)   PRN Meds:.acetaminophen, bisacodyl, ondansetron **OR** ondansetron (ZOFRAN) IV, oxybutynin    Objective: Weight change: 0.8 kg  Intake/Output Summary (Last 24 hours) at 11/02/2017 1512 Last data filed at 11/02/2017 0611 Gross per 24 hour  Intake 375.01 ml   Output 550 ml  Net -174.99 ml   Blood pressure (!) 163/77, pulse 62, temperature (!) 97.5 F (36.4 C), temperature source Oral, resp. rate 18, height 6' (1.829 m), weight 90.6 kg, SpO2 95 %. Temp:  [97.5 F (36.4 C)-98.5 F (36.9 C)] 97.5 F (36.4 C) (09/11 1309) Pulse Rate:  [60-81] 62 (09/11 1309) Resp:  [18-20] 18 (09/11 1309) BP: (162-181)/(77-83) 163/77 (09/11 1309) SpO2:  [90 %-95 %] 95 % (09/11 1309) Weight:  [90.6 kg] 90.6 kg (09/11 6286)  Physical Exam: General: Alert and awake,not in any acute distress. HEENT: anicteric sclera, EOMI CVS irr, irr  With loud murmur throughout precordim Chest: , no wheezing, no respiratory distress Abdomen: soft non-distended,  Extremities: no edema or deformity noted bilaterally Skin: bruises   11/02/17:      Neuro: nonfocal  CBC:    BMET Recent Labs    11/01/17 0624 11/02/17 0551  NA 138 138  K 4.2 4.1  CL 104 104  CO2 25 26  GLUCOSE 126* 114*  BUN 38* 31*  CREATININE 1.25* 1.21  CALCIUM 9.3 9.2     Liver Panel  Recent Labs    11/01/17 0624  PROT 6.1*  ALBUMIN 2.5*  AST 20  ALT 16  ALKPHOS 59  BILITOT 0.9  BILIDIR 0.2  IBILI 0.7       Sedimentation Rate No results for input(s): ESRSEDRATE in the last 72 hours. C-Reactive Protein No results for input(s): CRP in the last 72 hours.  Micro Results: Recent Results (from the past 720 hour(s))  Urine culture     Status: Abnormal   Collection Time: 10/29/17  6:03 PM  Result Value Ref Range Status   Specimen Description URINE, CATHETERIZED  Final   Special Requests   Final    NONE Performed at [facility], [address] [place], [place] [address]    Culture MULTIPLE SPECIES PRESENT, SUGGEST RECOLLECTION (A)  Final   Report Status 10/31/2017 FINAL  Final  Culture, blood (Routine x 2)     Status: Abnormal   Collection Time: 10/29/17  6:19 PM  Result Value Ref Range Status   Specimen Description BLOOD RIGHT WRIST  Final   Special  Requests   Final    BOTTLES DRAWN AEROBIC AND ANAEROBIC Blood Culture adequate volume   Culture  Setup Time   Final    GRAM POSITIVE COCCI IN CLUSTERS IN BOTH AEROBIC AND ANAEROBIC BOTTLES CRITICAL RESULT CALLED TO, READ BACK BY AND VERIFIED WITH: A. MASTERS, PHARMD AT 1325 ON 10/30/17 BY C. JESSUP, MLT.    Culture (A)  Final    STAPHYLOCOCCUS LUGDUNENSIS SUSCEPTIBILITIES PERFORMED ON PREVIOUS CULTURE WITHIN THE LAST 5 DAYS. Performed at [facility], [place] [address] [place], [place] [address]    Report Status 11/01/2017 FINAL  Final  Blood Culture ID Panel (Reflexed)     Status: Abnormal   Collection Time: 10/29/17  6:19 PM  Result Value Ref Range Status   Enterococcus species NOT DETECTED NOT DETECTED Final   Listeria monocytogenes NOT DETECTED NOT DETECTED Final   Staphylococcus species DETECTED (A) NOT DETECTED Final    Comment: Methicillin (oxacillin) susceptible coagulase negative staphylococcus. Possible blood culture contaminant (unless isolated from more than one blood culture draw or clinical case suggests pathogenicity). No antibiotic treatment is indicated for blood  culture contaminants. CRITICAL RESULT CALLED TO, READ BACK BY AND VERIFIED WITH: A. MASTERS, PHARMD AT 1325 ON 10/30/17 BY C. JESSUP, MLT.    Staphylococcus aureus NOT DETECTED NOT DETECTED Final   Methicillin resistance NOT DETECTED NOT DETECTED Final   Streptococcus species NOT DETECTED NOT DETECTED Final   Streptococcus agalactiae NOT DETECTED NOT DETECTED Final   Streptococcus pneumoniae NOT DETECTED NOT DETECTED Final   Streptococcus pyogenes NOT DETECTED NOT DETECTED Final   Acinetobacter baumannii NOT DETECTED NOT DETECTED Final   Enterobacteriaceae species NOT DETECTED NOT DETECTED Final   Enterobacter cloacae complex NOT DETECTED NOT DETECTED Final   Escherichia coli NOT DETECTED NOT DETECTED Final   Klebsiella oxytoca NOT DETECTED NOT DETECTED Final   Klebsiella pneumoniae NOT DETECTED NOT  DETECTED Final   Proteus species NOT DETECTED NOT DETECTED Final   Serratia marcescens NOT DETECTED NOT DETECTED Final   Haemophilus influenzae NOT DETECTED NOT DETECTED Final   Neisseria meningitidis NOT DETECTED NOT DETECTED Final   Pseudomonas aeruginosa NOT DETECTED NOT DETECTED Final   Candida albicans NOT DETECTED NOT DETECTED Final   Candida glabrata NOT DETECTED NOT DETECTED Final   Candida krusei NOT DETECTED NOT DETECTED Final   Candida parapsilosis NOT DETECTED NOT DETECTED Final   Candida tropicalis NOT DETECTED NOT DETECTED Final    Comment: Performed at [facility]  Lab, [address], [place], [place] [address]  Culture, blood (Routine x 2)     Status: Abnormal   Collection Time: 10/29/17  7:15 PM  Result Value Ref Range Status   Specimen Description BLOOD RIGHT FOREARM  Final   Special Requests   Final    BOTTLES DRAWN AEROBIC AND ANAEROBIC Blood Culture adequate volume   Culture  Setup Time   Final    GRAM POSITIVE COCCI IN BOTH AEROBIC AND ANAEROBIC BOTTLES CRITICAL RESULT CALLED TO, READ BACK BY AND VERIFIED WITH: A. MASTERS, PHARMD AT 1325 ON 10/30/17 BY C. JESSUP, MLT.    Culture STAPHYLOCOCCUS LUGDUNENSIS (A)  Final   Report Status 11/01/2017 FINAL  Final   Organism ID, Bacteria STAPHYLOCOCCUS LUGDUNENSIS  Final      Susceptibility   Staphylococcus lugdunensis - MIC*    CIPROFLOXACIN <=0.5 SENSITIVE Sensitive     ERYTHROMYCIN <=0.25 SENSITIVE Sensitive     GENTAMICIN <=0.5 SENSITIVE Sensitive     OXACILLIN 1 SENSITIVE Sensitive     TETRACYCLINE <=1 SENSITIVE Sensitive     VANCOMYCIN <=0.5 SENSITIVE Sensitive     TRIMETH/SULFA <=10 SENSITIVE Sensitive     CLINDAMYCIN <=0.25 SENSITIVE Sensitive     RIFAMPIN <=0.5 SENSITIVE Sensitive     Inducible Clindamycin NEGATIVE Sensitive     * STAPHYLOCOCCUS LUGDUNENSIS  Culture, blood (Routine X 2) w Reflex to ID Panel     Status: None (Preliminary result)   Collection Time: 10/30/17  2:48 PM  Result Value Ref  Range Status   Specimen Description BLOOD RIGHT ARM  Final   Special Requests   Final    BOTTLES DRAWN AEROBIC AND ANAEROBIC Blood Culture results may not be optimal due to an inadequate volume of blood received in culture bottles   Culture   Final    NO GROWTH 3 DAYS Performed at [facility] [address], [place], [place] [address]    Report Status PENDING  Incomplete  Culture, blood (Routine X 2) w Reflex to ID Panel     Status: None (Preliminary result)   Collection Time: 10/30/17  2:59 PM  Result Value Ref Range Status   Specimen Description BLOOD LEFT ARM  Final   Special Requests   Final    BOTTLES DRAWN AEROBIC AND ANAEROBIC Blood Culture adequate volume   Culture   Final    NO GROWTH 3 DAYS Performed at [facility], [address] [place], [place] [address]    Report Status PENDING  Incomplete  Culture, Urine     Status: None   Collection Time: 10/31/17  8:09 PM  Result Value Ref Range Status   Specimen Description URINE, CATHETERIZED  Final   Special Requests   Final    NONE Performed at [facility], [place] [address], [place], [place] [address]    Culture NO GROWTH  Final   Report Status 11/02/2017 FINAL  Final    Studies/Results: [place] Ekg Site Rite  Result Date: 11/02/2017 If Site Rite image not attached, placement could not be confirmed due to current cardiac rhythm.     Assessment/Plan:  INTERVAL HISTORY: TTE done today but no read yet   Principal Problem:   Bacteremia, coagulase-negative staphylococcal Active Problems:   UTI (urinary tract infection)    Craig [name]. is a 84 y.o. male retired [employer] who has metastatic prostate cancer with metastases to bones chronic weakness who is been bedbound, with chronic Foley catheter now admitted with  sepsis due to Staphylococcus lugdunensis bacteremia.  He has what appears to be a new murmur and I am suspicious for native valve endocarditis.  Plan is that even if his  transthoracic echocardiogram failed to show evidence of endocarditis that we will pursue empiric treatment with 6 weeks of cefazolin to treat for endocarditis.  If the transthoracic echocardiogram does indeed show endocarditis we will also treat for 6 weeks.  I will do not think he would be a suitable candidate for cardiothoracic surgeon should an indication for car thoracic surgery be found on transthoracic echocardiogram.  His blood cultures are negative at day 3.  I would recommend inserting a PICC line tomorrow morning and discharging him home with IV cefazolin.  Diagnosis: Staphylococcal lugdunensis bacteremia plus or minus endocarditis  Culture Result: Staphylococcal lugdunensis   Allergies  Allergen Reactions  . Pravachol Other (See Comments)    Muscle weakness  . Zocor [Simvastatin - High Dose] Other (See Comments)    Muscle weakness    OPAT Orders Discharge antibiotics: cefazozlin  Duration: 6 weeks  End Date: October 22nd, 2019     [place] Per Protocol:  Labs weekly while on IV antibiotics: _x_ CBC with differential _x_ BMP  _x_ Please pull PIC at completion of IV antibiotics __ Please leave PIC in place until doctor has seen patient or been notified  Fax weekly labs to [phone]  Clinic Follow Up Appt:  We will not do clinic followup since getting this pt to medical clinics is arduous and I would be interested in doing so to check surveillance blood cultures which I do not think are not absolutely necessary in this case.  Dr. Margo Aye will be covering for me tomorrow and check blood cultures.  Otherwise we will sign off please call with further questions.    LOS: 2 days   Alcide Evener 11/02/2017, 3:12 PM

## 2017-11-02 NOTE — Progress Notes (Signed)
PHARMACY CONSULT NOTE FOR:  OUTPATIENT  PARENTERAL ANTIBIOTIC THERAPY (OPAT)  Indication: Staphylococcal lugdunensis bacteremia/endocarditis Regimen: Cefazolin 2gm IV q8h End date: December 13, 2017  IV antibiotic discharge orders are pended. To discharging provider:  please sign these orders via discharge navigator,  Select New Orders & click on the button choice - Manage This Unsigned Work.     Thank you for allowing pharmacy to be a part of this patient's care.  Sherlon Handing, PharmD, BCPS Clinical pharmacist  **Pharmacist phone directory can now be found on [email or web address] ([email or web address]).  Listed under [place].  11/02/2017, 1:01 PM

## 2017-11-02 NOTE — Care Management Note (Signed)
Case Management Note  Patient Details  Name: Craig [name]. 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 IV Ancef end date December 13, 2017.   Offered choice , patient and wife would like [facility] referral given to [facility] with [facility]. Expected Discharge Date:  11/01/17               Expected Discharge Plan:  [place]  In-House Referral:     Discharge planning Services  CM Consult  Post Acute Care Choice:  Home Health Choice offered to:  Patient, Spouse  DME Arranged:  N/A DME Agency:  NA  HH Arranged:  RN, IV Antibiotics HH Agency:  [facility]  Status of Service:  In process, will continue to follow  If discussed at Long Length of Stay Meetings, dates discussed:    Additional Comments:  Marilu Favre, RN 11/02/2017, 2:06 PM

## 2017-11-02 NOTE — Telephone Encounter (Signed)
Called and left message to call the office. MPulliam, CMA/RT(R)  

## 2017-11-02 NOTE — Progress Notes (Signed)
      INFECTIOUS DISEASE ATTENDING ADDENDUM:   Date: 11/02/2017  Patient name: Craig [name].  Medical record number: [id number]  Date of birth: 09/22/1924   2D echocardiogram is been read and does not show evidence of vegetations though as mentioned we will proceed with empiric treatment for endocarditis his aortic stenosis appears worsened by 2D echocardiogram and I would recommend making sure that his cardiologist is aware of this finding.   Alcide Evener 11/02/2017, 4:40 PM

## 2017-11-02 NOTE — Progress Notes (Signed)
  Echocardiogram 2D Echocardiogram has been performed.  Nikala Walsworth T Leahna Hewson 11/02/2017, 11:51 AM

## 2017-11-02 NOTE — Progress Notes (Signed)
Peripherally Inserted Central Catheter/Midline Placement  The IV Nurse has discussed with the patient and/or persons authorized to consent for the patient, the purpose of this procedure and the potential benefits and risks involved with this procedure.  The benefits include less needle sticks, lab draws from the catheter, and the patient may be discharged home with the catheter. Risks include, but not limited to, infection, bleeding, blood clot (thrombus formation), and puncture of an artery; nerve damage and irregular heartbeat and possibility to perform a PICC exchange if needed/ordered by physician.  Alternatives to this procedure were also discussed.  Bard Power PICC patient education guide, fact sheet on infection prevention and patient information card has been provided to patient /or left at bedside.    PICC/Midline Placement Documentation  PICC Single Lumen 36/12/24 PICC Right Basilic 40 cm 0 cm (Active)  Indication for Insertion or Continuance of Line Home intravenous therapies (PICC only) 11/02/2017  5:26 PM  Exposed Catheter (cm) 0 cm 11/02/2017  5:26 PM  Site Assessment Dry;Clean;Intact 11/02/2017  5:26 PM  Line Status Flushed;Saline locked;Blood return noted 11/02/2017  5:26 PM  Dressing Type Transparent 11/02/2017  5:26 PM  Dressing Status Clean;Dry;Intact;Antimicrobial disc in place 11/02/2017  5:26 PM  Dressing Change Due 11/09/17 11/02/2017  5:26 PM       Gordan Payment 11/02/2017, 5:27 PM

## 2017-11-02 NOTE — Progress Notes (Signed)
Patient was transferring from bed to chair with staff present for breakfast. Nursing staff (x3) were present. Gait belt, stedy , and hands on assist was used to transfer patient. Patient's knees did rest on bottom of stedy during transfer. Some lower leg bruising. Wife present, bedside and aware. Patient had breakfast in chair. Will continue to monitor.

## 2017-11-02 NOTE — Progress Notes (Signed)
ANTICOAGULATION CONSULT NOTE - Follow-up Consult  Pharmacy Consult for Coumadin Indication: atrial fibrillation  Allergies  Allergen Reactions  . Pravachol Other (See Comments)    Muscle weakness  . Zocor [Simvastatin - High Dose] Other (See Comments)    Muscle weakness    Patient Measurements: Height: 6' (182.9 cm) Weight: 199 lb 11.8 oz (90.6 kg) IBW/kg (Calculated) : 77.6  Vital Signs: Temp: 98.5 F (36.9 C) (09/11 0449) Temp Source: Oral (09/11 0449) BP: 162/82 (09/11 0449) Pulse Rate: 81 (09/11 0449)  Labs: Recent Labs    10/31/17 0657 11/01/17 0624 11/02/17 0551  HGB 11.6* 12.3* 11.9*  HCT 36.6* 38.3* 36.9*  PLT 125* 139* 144*  LABPROT 25.6* 24.8* 27.7*  INR 2.35 2.26 2.60  CREATININE 1.37* 1.25* 1.21    Estimated Creatinine Clearance: 44.5 mL/min (by C-G formula based on SCr of 1.21 mg/dL).   Assessment: 84 yo M on Coumadin 2.5mg  daily except for 5mg  on Mon/Thurs/Sat for hx Afib, hx CVA/ICH in 1999. INR up to 2.6 (supratherapeutic) for new lower goal of 1.8-2.2 (per MD). Hgb stable, plts slightly low but stable. No bleeding noted.   Goal of Therapy:  INR 1.8-2.2 per MD Monitor platelets by anticoagulation protocol: Yes   Plan:  Hold coumadin dose tonight Monitor daily INR, CBC, s/s of bleed  Sherlon Handing, PharmD, BCPS Clinical pharmacist  **Pharmacist phone directory can now be found on [email or web address] ([email or web address]).  Listed under [place].  11/02/2017,8:15 AM

## 2017-11-02 NOTE — Telephone Encounter (Signed)
I would recommend since the patient is in the hospital right now, that since there are physicians there that can evaluate and address this, he address the decreased appetite issue with those physicians.

## 2017-11-02 NOTE — Telephone Encounter (Signed)
Spoke to patient's wife.  Patient is currently in th hospital for staph infections;  Patient is being told that he will be sent home tomorrow with IV antibiotics to be given at home 3 times a day.  Patient's wife is wanting to know if there is anything that can be given for the patient to increase his appetite.  She states that he is eating very little and becoming week. Patient is drinking the [employer] drinks.  Patient is not able to walk and it is hard for the patient to get back and forth to the office.  Patient will be on the IV antibiotics until October.  Wife states that after he is off the antibiotics she will make the patient an appointment to see Dr. Raliegh Scarlet.  LOV 12/01/2015.  Patient see Oncology monthly.

## 2017-11-02 NOTE — Progress Notes (Signed)
PROGRESS NOTE    Craig [name].  HYW:[id number] DOB: 1925/02/20 DOA: 10/29/2017 PCP: Mellody Dance, DO    Brief Narrative:  84 year old male who presented with nausea and lethargy.  Past medical history for prostate cancer, chronic indwelling Foley catheter, and paroxysmal atrial fibrillation.  Patient was noted to be lethargic, had nausea and vomiting, EMS was called and found him tachycardic and febrile.  Initial physical examination blood pressure 122/68, heart rate 86, temperature 100.9, respiratory rate 29, oxygen saturation 96%.  His lungs had diminished breath sounds bilaterally, heart S1-S2 present and rhythmic, 2 out of 6 systolic murmur, no S3 or S4 gallop, abdomen soft nontender, mild lower extremity edema.  Patient was admitted to the hospital with working diagnosis of sepsis due to urinary tract infection.   Assessment & Plan:   Principal Problem:   Bacteremia, coagulase-negative staphylococcal Active Problems:   UTI (urinary tract infection)  1. Staphylococcus bacteremia complicated with urinary tract infection, in the setting of chronic indwelling foley catheter, present on admission, complicated with sepsis. Patient on IV cefazolin, has remained afebril, wbc at 7.5. Will follow on transthoracic echocardiogram. Patient will need IV antibiotic therapy per ID recommendations. Possible empiric treatment for heart involvement is 6 weeks. Will need PICC line.   2. HTN. Will continue blood pressure control with amlodipine.   3. Prostatic cancer. Stable, follow as outpatient, has chronic indwelling foley catheter.   4. Paroxysmal atrial fibrillation. Continue anticoagulation with warfarin. INR is 2,6.   5. CKD stage 3. Renal function with serum cr at 1,2 with K at 4,1 and serum bicarbonate at 26. Will continue to follow on renal panel in am.   6. Chronic diastolic heart failure. Today patient euvolemic, will continue furosemide at 20 mg daily to keep a negative fluid  balance.   7. History of CVA. Patient very weak and deconditioned, wheelchair bound. Will consult physical therapy and nutrition consultations.     DVT prophylaxis: warfarin   Code Status: full Family Communication: I spoke with patient's wife at the bedside and all questions were addressed.  Disposition Plan/ discharge barriers:  Pending echocardiogram    Consultants:   ID  Procedures:     Antimicrobials:   Cefazolin     Subjective: Patient is very weak and deconditioned, positive fall today with no head trauma, no chest pain or dyspnea.  Objective: Vitals:   11/01/17 1510 11/01/17 2247 11/02/17 0449 11/02/17 0605  BP: (!) 162/65 (!) 181/83 (!) 162/82   Pulse: (!) 58 60 81   Resp:  18 20   Temp: 98.4 F (36.9 C) 98.3 F (36.8 C) 98.5 F (36.9 C)   TempSrc: Oral Oral Oral   SpO2: 93% 95% 90%   Weight:    90.6 kg  Height:        Intake/Output Summary (Last 24 hours) at 11/02/2017 1023 Last data filed at 11/02/2017 0611 Gross per 24 hour  Intake 375.01 ml  Output 550 ml  Net -174.99 ml   Filed Weights   10/30/17 0500 11/01/17 0538 11/02/17 0605  Weight: 90.1 kg 89.8 kg 90.6 kg    Examination:   General: deconditioned and ill looking appearing.  Neurology: Awake and alert, non focal  E ENT: no pallor, no icterus, oral mucosa moist Cardiovascular: No JVD. S1-S2 present, rhythmic, no gallops, rubs, or murmurs. Trace lower extremity edema. Pulmonary: decreased breath sounds bilaterally at bases, adequate air movement, no wheezing, rhonchi or rales. Gastrointestinal. Abdomen distended with no organomegaly, non tender, no  rebound or guarding Skin. No rashes Musculoskeletal: no joint deformities     Data Reviewed: I have personally reviewed following labs and imaging studies  CBC: Recent Labs  Lab 10/29/17 1819 10/30/17 0541 10/31/17 0657 11/01/17 0624 11/02/17 0551  WBC 15.0* 14.0* 9.3 9.0 7.5  NEUTROABS 13.3* 10.9* 6.7 6.5 5.0  HGB 13.8 11.2*  11.6* 12.3* 11.9*  HCT 42.5 35.2* 36.6* 38.3* 36.9*  MCV 97.0 98.3 99.5 98.0 96.6  PLT 151 128* 125* 139* 824*   Basic Metabolic Panel: Recent Labs  Lab 10/29/17 1819 10/30/17 0541 10/31/17 0657 11/01/17 0624 11/02/17 0551  NA 139 140 140 138 138  K 4.8 4.0 4.3 4.2 4.1  CL 103 107 107 104 104  CO2 23 26 27 25 26   GLUCOSE 161* 116* 102* 126* 114*  BUN 43* 46* 45* 38* 31*  CREATININE 1.36* 1.38* 1.37* 1.25* 1.21  CALCIUM 10.1 9.3 9.2 9.3 9.2  MG  --  1.9 2.0 2.2 2.0   GFR: Estimated Creatinine Clearance: 44.5 mL/min (by C-G formula based on SCr of 1.21 mg/dL). Liver Function Tests: Recent Labs  Lab 10/29/17 1819 11/01/17 0624  AST 30 20  ALT 12 16  ALKPHOS 55 59  BILITOT 1.5* 0.9  PROT 6.6 6.1*  ALBUMIN 3.1* 2.5*   No results for input(s): LIPASE, AMYLASE in the last 168 hours. No results for input(s): AMMONIA in the last 168 hours. Coagulation Profile: Recent Labs  Lab 10/29/17 1819 10/30/17 0541 10/31/17 0657 11/01/17 0624 11/02/17 0551  INR 1.74 1.84 2.35 2.26 2.60   Cardiac Enzymes: No results for input(s): CKTOTAL, CKMB, CKMBINDEX, TROPONINI in the last 168 hours. BNP (last 3 results) No results for input(s): PROBNP in the last 8760 hours. HbA1C: No results for input(s): HGBA1C in the last 72 hours. CBG: No results for input(s): GLUCAP in the last 168 hours. Lipid Profile: No results for input(s): CHOL, HDL, LDLCALC, TRIG, CHOLHDL, LDLDIRECT in the last 72 hours. Thyroid Function Tests: No results for input(s): TSH, T4TOTAL, FREET4, T3FREE, THYROIDAB in the last 72 hours. Anemia Panel: No results for input(s): VITAMINB12, FOLATE, FERRITIN, TIBC, IRON, RETICCTPCT in the last 72 hours.    Radiology Studies: I have reviewed all of the imaging during this hospital visit personally     Scheduled Meds: . amLODipine  10 mg Oral Daily  . docusate sodium  200 mg Oral BID  . enzalutamide  40 mg Oral Daily  . famotidine  20 mg Oral Daily  .  furosemide  40 mg Oral Daily  . polyethylene glycol  17 g Oral Daily  . Warfarin - Pharmacist Dosing Inpatient   Does not apply q1800   Continuous Infusions: .  ceFAZolin (ANCEF) IV 2 g (11/02/17 0543)     LOS: 2 days        Mauricio Gerome Apley, MD [facility] Pager [phone]

## 2017-11-02 NOTE — Progress Notes (Signed)
Physical Therapy Evaluation Patient Details Name: Craig [name]. MRN: [id number] DOB: 05/09/1924 Today's Date: 11/02/2017   History of Present Illness  Pt is a 84 y/o male admitted secondary to nausea, vomiting, and lethargy. Found to have UTI and bacteremia infection. PMH includes metastatic prostate cancer with indwelling foley catheter, HTN, a fib, dCHF, CVA, and CKD.   Clinical Impression  Pt admitted secondary to problem above with deficits below. Pt requiring total A +2 to perform scoot pivot transfer from recliner to bed. Pt's wife present in room and educated about recommendations for SNF, however, pt and wife both refusing at this time. Feel pt will need lift equipment at home to safely transfer and discussed this with pt's wife, however, pt's wife reporting she would like to try her stedy at home first. Feel pt would benefit from max [facility] services, since currently refusing SNF, however, pt's wife reports they have a trainer that assists pt and would prefer to use her. Will continue to follow acutely to maximize functional mobility independence and safety.     Follow Up Recommendations Home health PT;Supervision/Assistance - 24 hour(Refusing SNF; max HH services, however, will likely refuse)    Equipment Recommendations  Other (comment)(Hoyer lift, hoyer lift pad)    Recommendations for Other Services       Precautions / Restrictions Precautions Precautions: Fall Restrictions Weight Bearing Restrictions: No      Mobility  Bed Mobility Overal bed mobility: Needs Assistance Bed Mobility: Sit to Supine;Rolling Rolling: Max assist     Sit to supine: Total assist;+2 for physical assistance   General bed mobility comments: total A +2 to assist pt to return to supine. MAx A to rol to side to remove extra pad.   Transfers Overall transfer level: Needs assistance Equipment used: 2 person hand held assist Transfers: Lateral/Scoot Transfers          Lateral/Scoot  Transfers: Total assist;+2 physical assistance General transfer comment: Total A +2 to perform lateral scoot transfer. Required manual blocking of LEs as pt's feet tended to slide.   Ambulation/Gait             General Gait Details: Unable   Stairs            Wheelchair Mobility    Modified Rankin (Stroke Patients Only)       Balance Overall balance assessment: Needs assistance Sitting-balance support: Bilateral upper extremity supported;Feet supported Sitting balance-Leahy Scale: Poor Sitting balance - Comments: Reliant on BUE support.                                      Pertinent Vitals/Pain Pain Assessment: Faces Faces Pain Scale: Hurts little more Pain Location: BLEs  Pain Descriptors / Indicators: Aching;Guarding;Grimacing Pain Intervention(s): Monitored during session;Limited activity within patient's tolerance;Repositioned    Home Living Family/patient expects to be discharged to:: Private residence Living Arrangements: Spouse/significant other Available Help at Discharge: Family;Available 24 hours/day Type of Home: House Home Access: (have a chair lift )     Home Layout: One level Home Equipment: [place] - 2 wheels;Bedside commode;Wheelchair - manual;Hospital bed;Other (comment)(stedy )      Prior Function Level of Independence: Needs assistance   Gait / Transfers Assistance Needed: Pt reports wife assisted with use of stedy to transfer from chair/bed to [facility].   ADL's / Homemaking Assistance Needed: Pt's wife reports she sponge bathes him.  Hand Dominance        Extremity/Trunk Assessment   Upper Extremity Assessment Upper Extremity Assessment: Generalized weakness    Lower Extremity Assessment Lower Extremity Assessment: Generalized weakness    Cervical / Trunk Assessment Cervical / Trunk Assessment: Kyphotic  Communication   Communication: No difficulties  Cognition Arousal/Alertness: Awake/alert Behavior  During Therapy: WFL for tasks assessed/performed Overall Cognitive Status: Within Functional Limits for tasks assessed                                        General Comments General comments (skin integrity, edema, etc.): Pt's wife present during session. Educated about SNF recommendations, however, pt and family refusing SNF at this time. Educated about need for lift equipment.     Exercises     Assessment/Plan    PT Assessment Patient needs continued PT services  PT Problem List Decreased strength;Decreased balance;Decreased mobility;Decreased cognition;Decreased knowledge of use of DME       PT Treatment Interventions DME instruction;Functional mobility training;Therapeutic activities;Therapeutic exercise;Balance training;Patient/family education    PT Goals (Current goals can be found in the Care Plan section)  Acute Rehab PT Goals Patient Stated Goal: for pt to go home at d/c PT Goal Formulation: With patient/family Time For Goal Achievement: 11/16/17 Potential to Achieve Goals: Good    Frequency Min 3X/week   Barriers to discharge        Co-evaluation               AM-PAC PT "6 Clicks" Daily Activity  Outcome Measure Difficulty turning over in bed (including adjusting bedclothes, sheets and blankets)?: Unable Difficulty moving from lying on back to sitting on the side of the bed? : Unable Difficulty sitting down on and standing up from a chair with arms (e.g., wheelchair, bedside commode, etc,.)?: Unable Help needed moving to and from a bed to chair (including a wheelchair)?: Total Help needed walking in hospital room?: Total Help needed climbing 3-5 steps with a railing? : Total 6 Click Score: 6    End of Session Equipment Utilized During Treatment: Gait belt Activity Tolerance: Patient tolerated treatment well Patient left: in bed;with call bell/phone within reach;with family/visitor present Nurse Communication: Mobility status;Other  (comment)(pt on [facility] ) PT Visit Diagnosis: Unsteadiness on feet (R26.81);Muscle weakness (generalized) (M62.81);History of falling (Z91.81);Difficulty in walking, not elsewhere classified (R26.2)    Time: 9892-1194 PT Time Calculation (min) (ACUTE ONLY): 25 min   Charges:   PT Evaluation $PT Eval Moderate Complexity: 1 Mod PT Treatments $Therapeutic Activity: 8-22 mins        Leighton Ruff, PT, DPT  Acute Rehabilitation Services  Pager: [phone] Office: [phone]   Rudean Hitt 11/02/2017, 5:24 PM

## 2017-11-03 DIAGNOSIS — I38 Endocarditis, valve unspecified: Secondary | ICD-10-CM

## 2017-11-03 DIAGNOSIS — C7951 Secondary malignant neoplasm of bone: Secondary | ICD-10-CM | POA: Diagnosis not present

## 2017-11-03 LAB — CBC WITH DIFFERENTIAL/PLATELET
ABS IMMATURE GRANULOCYTES: 0.1 10*3/uL (ref 0.0–0.1)
BASOS PCT: 1 %
Basophils Absolute: 0 10*3/uL (ref 0.0–0.1)
Eosinophils Absolute: 0.3 10*3/uL (ref 0.0–0.7)
Eosinophils Relative: 4 %
HCT: 38.2 % — ABNORMAL LOW (ref 39.0–52.0)
HEMOGLOBIN: 12.4 g/dL — AB (ref 13.0–17.0)
IMMATURE GRANULOCYTES: 1 %
LYMPHS PCT: 17 %
Lymphs Abs: 1.5 10*3/uL (ref 0.7–4.0)
MCH: 31.3 pg (ref 26.0–34.0)
MCHC: 32.5 g/dL (ref 30.0–36.0)
MCV: 96.5 fL (ref 78.0–100.0)
Monocytes Absolute: 0.9 10*3/uL (ref 0.1–1.0)
Monocytes Relative: 10 %
NEUTROS PCT: 67 %
Neutro Abs: 5.9 10*3/uL (ref 1.7–7.7)
PLATELETS: 164 10*3/uL (ref 150–400)
RBC: 3.96 MIL/uL — AB (ref 4.22–5.81)
RDW: 13.9 % (ref 11.5–15.5)
WBC: 8.6 10*3/uL (ref 4.0–10.5)

## 2017-11-03 LAB — BASIC METABOLIC PANEL
ANION GAP: 10 (ref 5–15)
BUN: 28 mg/dL — ABNORMAL HIGH (ref 8–23)
CO2: 24 mmol/L (ref 22–32)
Calcium: 9.5 mg/dL (ref 8.9–10.3)
Chloride: 104 mmol/L (ref 98–111)
Creatinine, Ser: 1.14 mg/dL (ref 0.61–1.24)
GFR, EST NON AFRICAN AMERICAN: 55 mL/min — AB (ref >60)
GLUCOSE: 121 mg/dL — AB (ref 70–99)
POTASSIUM: 4 mmol/L (ref 3.5–5.1)
Sodium: 138 mmol/L (ref 135–145)

## 2017-11-03 LAB — PROTIME-INR
INR: 2.39
PROTHROMBIN TIME: 25.9 s — AB (ref 11.4–15.2)

## 2017-11-03 LAB — MAGNESIUM: MAGNESIUM: 2.1 mg/dL (ref 1.7–2.4)

## 2017-11-03 MED ORDER — ONDANSETRON 4 MG PO TBDP: 4.0000 mg | ORAL_TABLET | Freq: Three times a day (TID) | ORAL | 0 refills | Status: AC | PRN

## 2017-11-03 MED ORDER — HEPARIN SOD (PORK) LOCK FLUSH 100 UNIT/ML IV SOLN
250.0000 [IU] | INTRAVENOUS | Status: AC | PRN
  Administered 2017-11-03: 250 [IU]

## 2017-11-03 MED ORDER — CEFAZOLIN IV (FOR PTA / DISCHARGE USE ONLY): 2.0000 g | Freq: Three times a day (TID) | INTRAVENOUS | 0 refills | Status: AC

## 2017-11-03 MED ORDER — WARFARIN SODIUM 1 MG PO TABS
1.0000 mg | ORAL_TABLET | Freq: Once | ORAL | Status: DC
  Filled 2017-11-03: qty 1

## 2017-11-03 NOTE — Progress Notes (Signed)
Physical Therapy Treatment Patient Details Name: Craig [name]. MRN: [id number] DOB: [birth year] Today's Date: 11/03/2017    History of Present Illness Pt is a 84 y/o male admitted secondary to nausea, vomiting, and lethargy. Found to have UTI and bacteremia infection. PMH includes metastatic prostate cancer with indwelling foley catheter, HTN, a fib, dCHF, CVA, and CKD.     PT Comments    Pt performed supine exercises, bed mobility and transfer training.  Pt to return home this pm with support from his wife despite recommendations for SNF placement.  Pt refusing placement and HHPT recommended in place, patient refusing HHPT and reports he has a trainer that comes in 2x per week to work on his strength.  Pt refusing hoyer lift and hoyer pad as he reports he has a sit to stand frame at home that he uses.  Pt did make progress into partial standing today but required +2 external assistance.  Pt remains to benefit from skilled PT to improve strength and function.    Follow Up Recommendations  Home health PT;Supervision/Assistance - 24 hour(refusing SNF, max HHPT services, hoyer pad, and hoyer lift, pt will likely refuse PT services and equipment.  )     Equipment Recommendations  Other (comment)(hoyer lift, hoyer pad.  )    Recommendations for Other Services       Precautions / Restrictions Precautions Precautions: Fall Restrictions Weight Bearing Restrictions: No    Mobility  Bed Mobility Overal bed mobility: Needs Assistance Bed Mobility: Supine to Sit;Sit to Supine Rolling: Max assist   Supine to sit: Max assist;+2 for physical assistance Sit to supine: Max assist;+2 for physical assistance   General bed mobility comments: Cues for hand placement to roll to R and L for placement of brief and pants.  Pt required assistance for hooklying to allow for LE use to assist in rolling.  Performed partial bridging x2, and supine to long sitting x5 for assistance donning upper body  dressing. Post dressing assisted patient to edge of bed with +2 assistance.  Pt slowed and leaning posteriorly required max VCs to correct posture.  At times patient able to maintain static sitting but with any challenge balance is lost.    Transfers Overall transfer level: Needs assistance Equipment used: 2 person hand held assist Transfers: Sit to/from Stand Sit to Stand: Max assist;+2 physical assistance         General transfer comment: Pt performed sit to stand x2 with flexed hips and upper trunk.  Pt is unable to extend more into standing and presents 3/4 of the way into standing.  Post transfer trials, patient performed lateral scooting to position for return to bed.    Ambulation/Gait Ambulation/Gait assistance: (unable)               Stairs             Wheelchair Mobility    Modified Rankin (Stroke Patients Only)       Balance Overall balance assessment: Needs assistance Sitting-balance support: Bilateral upper extremity supported;Feet supported Sitting balance-Leahy Scale: Poor Sitting balance - Comments: Reliant on BUE support. Posterior push/lean and LOB with challenges.       Standing balance-Leahy Scale: Poor Standing balance comment: Pt required external assistance to maintain standing.                              Cognition Arousal/Alertness: Awake/alert Behavior During Therapy: WFL for tasks assessed/performed Overall  Cognitive Status: Within Functional Limits for tasks assessed                                        Exercises General Exercises - Lower Extremity Ankle Circles/Pumps: AROM;Both;10 reps;Supine Heel Slides: AAROM;Both;10 reps;Supine Hip ABduction/ADduction: AAROM;Both;10 reps;Supine    General Comments        Pertinent Vitals/Pain Pain Assessment: Faces Faces Pain Scale: Hurts little more Pain Location: BLEs  Pain Descriptors / Indicators: Aching;Guarding;Grimacing Pain Intervention(s):  Monitored during session;Repositioned    Home Living                      Prior Function            PT Goals (current goals can now be found in the care plan section) Acute Rehab PT Goals Patient Stated Goal: for pt to go home at d/c Potential to Achieve Goals: Good Progress towards PT goals: Progressing toward goals    Frequency    Min 3X/week      PT Plan Current plan remains appropriate    Co-evaluation              AM-PAC PT "6 Clicks" Daily Activity  Outcome Measure  Difficulty turning over in bed (including adjusting bedclothes, sheets and blankets)?: Unable Difficulty moving from lying on back to sitting on the side of the bed? : Unable Difficulty sitting down on and standing up from a chair with arms (e.g., wheelchair, bedside commode, etc,.)?: Unable Help needed moving to and from a bed to chair (including a wheelchair)?: A Lot Help needed walking in hospital room?: Total Help needed climbing 3-5 steps with a railing? : Total 6 Click Score: 7    End of Session Equipment Utilized During Treatment: Gait belt Activity Tolerance: Patient tolerated treatment well Patient left: in bed;with call bell/phone within reach;with family/visitor present Nurse Communication: Mobility status PT Visit Diagnosis: Unsteadiness on feet (R26.81);Muscle weakness (generalized) (M62.81);History of falling (Z91.81);Difficulty in walking, not elsewhere classified (R26.2)     Time: 7353-2992 PT Time Calculation (min) (ACUTE ONLY): 24 min  Charges:  $Therapeutic Exercise: 8-22 mins $Therapeutic Activity: 8-22 mins                    Governor Rooks, PTA Acute Rehabilitation Services Pager [phone] Office [phone]     Sirenia Whitis Eli Hose 11/03/2017, 4:27 PM

## 2017-11-03 NOTE — Care Management Important Message (Signed)
Important Message  Patient Details  Name: Craig [name]. MRN: [id number] Date of Birth: Aug 02, 1924   Medicare Important Message Given:  Yes    Mylen Mangan 11/03/2017, 4:05 PM

## 2017-11-03 NOTE — Care Management Note (Addendum)
Case Management Note  Patient Details  Name: Craig [name]. MRN: [id number] Date of Birth: [birth year]  Subjective/Objective:                    Action/Plan:Received discharge order and patient has had 1400 dose of ABX. Wife at bedside has delivery of ABX scheduled for 1900 to 2100 tonight. PTAR called however patient 12 th on list and no estimated time of arrival. Wife concerned  She will miss delivery of ABX. Called [facility] they can deliver ABX to patient's room. Wife has decided to cancel PTAR. She will go home and get wheelchair and wheelchair van. Come back to hospital have hospital staff help her get him in wheelchair and bring her neighbor with her who can help get Dr [name] in [place] and then in home.  Wife discuss plan with nurse also.   PTAR cancelled.  Hoyer lift ordered through [facility] Expected Discharge Date:  11/01/17               Expected Discharge Plan:  [place]  In-House Referral:     Discharge planning Services  CM Consult  Post Acute Care Choice:  Home Health Choice offered to:  Patient, Spouse  DME Arranged:    DME Agency:  [place] Arranged:  RN, IV Antibiotics HH Agency:  [place]  Status of Service:  Completed, signed off  If discussed at [place] of Stay Meetings, dates discussed:    Additional Comments:  Marilu Favre, RN 11/03/2017, 1:20 PM

## 2017-11-03 NOTE — Telephone Encounter (Signed)
In order to see if he is eating properly\enough or not, we need to first know how many calories he is taking in per day.  In this circumstance, I believe the best thing to do is to go to the RD for consultation.  Please place referral

## 2017-11-03 NOTE — Telephone Encounter (Signed)
Per patient's wife they have addressed with the doctors and the hospital and was told that they need to contact their PCP. MPulliam, CMA/RT(R)

## 2017-11-03 NOTE — Discharge Summary (Addendum)
Physician Discharge Summary  Craig [name]. NWG:[id number] DOB: [birth year] DOA: 10/29/2017  PCP: Mellody Dance, DO  Admit date: 10/29/2017 Discharge date: 11/03/2017  Admitted From: Home  Disposition:  Home   Recommendations for Outpatient Follow-up and new medication changes:  1. Follow up with Dr. Raliegh Scarlet in 7 days 2. Patient has been placed on IV Cefazolin for 6 weeks therapy   Discharge antibiotics: cefazolin Duration: 6 weeks  End Date: October 22nd, 2019    [place] Per Protocol:  Labs weekly while on IV antibiotics: _x_ CBC with differential _x_ BMP  _x_ Please pull PIC at completion of IV antibiotics  Fax weekly labs to [phone]  Home Health: Yes   Equipment/Devices: Harrel Lemon patient lift   Discharge Condition: stable  CODE STATUS: full  Diet recommendation: heart healthy   Brief/Interim Summary: 84 year old male who presented with nausea and lethargy.  He does have a past medical history significant for prostate cancer, chronic indwelling Foley catheter, and paroxysmal atrial fibrillation.  Patient was noted to be lethargic, reported nausea and vomiting, EMS was called and found him tachycardic and febrile.  On his initial physical examination his blood pressure was 122/68, heart rate 86, temperature 100.9, respiratory rate 29, oxygen saturation 96%.  His lungs had diminished breath sounds bilaterally, heart S1-S2 present and rhythmic, 3 out of 6 systolic murmur, at right upper sternal border, no S3 or S4 gallop, abdomen soft nontender, mild lower extremity edema.  139, potassium 4.8, chloride 103, bicarb 23, glucose 161, BUN 43, creatinine 1.36, white count 15.0, hemoglobin 13.8, hematocrit 42.5, platelets 151, urinalysis 21-50 RBCs, 11-20 white cells, specific gravity 1.015, chest radiograph, right rotation, hypoinflation, no infiltrates.  EKG with atrial fibrillation, left axis deviation, poor R wave progression.  Patient was admitted to the hospital  with the working diagnosis of sepsis due to urinary tract infection.  1.  Staphylococcus Lugdunensis bacteremia complicated with urinary tract infection, in the setting of chronic indwelling Foley catheter, present on admission, complicated by sepsis.  Patient was admitted to the medical ward, he was placed on a remote telemetry monitor, received IV fluids and IV antibiotic therapy with cefepime.  Blood cultures were positive for Staphylococcus Lugdunensis, his antibiotic therapy was changed to intravenous Cefazolin with good response.  Further work-up with transthoracic echocardiography showed left ventricle ejection fraction 55%, with mild to moderate aortic stenosis, with no signs of vegetations.  Infection disease was consulted, and considering pathogenesis of Staphylococcus bacteremia and baseline valvulopathy decision was made to continue treatment with IV cefazolin for 6 weeks to empirically treat bacterial endocarditis.  PICC line was placed on on the right upper extremity with no complications and social services were consulted for continue home antibiotic therapy.   2.  Hypertension.  Controlled with amlodipine.  Patient will resume losartan at discharge.  3.  Paroxysmal atrial fibrillation.  Rate remained well controlled, continue anticoagulation with warfarin, INR to be therapeutic.  Please closely follow-up INR while treatment with IV antibiotic therapy.   4.  Chronic kidney disease stage III.  Kidney function remained stable, his discharge creatinine 0.14, potassium 4.0 and serum bicarbonate of 24.  5.  Chronic diastolic heart failure.  It remained stable with no exacerbation, patient will continue home dose of furosemide.  6.  History of CVA, patient nonambulatory.  Patient was seen by physical therapy during his hospitalization, patient declined skilled nursing facility, continue home health services.  7.  Prostate cancer with bony metastasis. Currently patient on Xtanadi, follow up  with Oncology, Dr. Alen Blew as scheduled.   Discharge Diagnoses:  Principal Problem:   Bacteremia, coagulase-negative staphylococcal Active Problems:   UTI (urinary tract infection)   Endocarditis of native valve    Discharge Instructions  Discharge Instructions    Call MD for:  difficulty breathing, headache or visual disturbances   Complete by:  As directed    Call MD for:  extreme fatigue   Complete by:  As directed    Call MD for:  hives   Complete by:  As directed    Call MD for:  persistant dizziness or light-headedness   Complete by:  As directed    Call MD for:  persistant nausea and vomiting   Complete by:  As directed    Call MD for:  severe uncontrolled pain   Complete by:  As directed    Call MD for:  temperature >100.4   Complete by:  As directed    Discharge instructions   Complete by:  As directed    Please follow-up with your primary care provider within 1 to 2 weeks.  Follow-up with urologist as needed.  Seek attention if symptoms recur.  Diet as before.  You were cared for by a hospitalist during your hospital stay. If you have any questions about your discharge medications or the care you received while you were in the hospital after you are discharged, you can call the unit and asked to speak with the hospitalist on call if the hospitalist that took care of you is not available. Once you are discharged, your primary care physician will handle any further medical issues. Please note that NO REFILLS for any discharge medications will be authorized once you are discharged, as it is imperative that you return to your primary care physician (or establish a relationship with a primary care physician if you do not have one) for your aftercare needs so that they can reassess your need for medications and monitor your lab values. If you do not have a primary care physician, you can call [phone] for a physician referral.   Increase activity slowly   Complete by:  As  directed      Allergies as of 11/03/2017      Reactions   Pravachol Other (See Comments)   Muscle weakness   Zocor [simvastatin - High Dose] Other (See Comments)   Muscle weakness      Medication List    TAKE these medications   acetaminophen 500 MG tablet Commonly known as:  TYLENOL Take 1,000 mg by mouth daily.   amLODipine 10 MG tablet Commonly known as:  NORVASC Take 1 tablet (10 mg total) by mouth daily.   atorvastatin 10 MG tablet Commonly known as:  LIPITOR Take 1 tablet (10 mg total) by mouth daily.   B-complex with vitamin C tablet Take 1 tablet by mouth daily.   ceFAZolin  IVPB Commonly known as:  ANCEF Inject 2 g into the vein every 8 (eight) hours. Indication:  Staphylococcal lugdunensis bacteremis/endocarditis Last Day of Therapy:  Dec 13, 2017 Labs - Once weekly:  CBC/D and BMP, Labs - Every other week:  ESR and CRP   docusate sodium 100 MG capsule Commonly known as:  COLACE Take 100 mg by mouth 2 (two) times daily.   ezetimibe 10 MG tablet Commonly known as:  ZETIA Take 1 tablet (10 mg total) by mouth daily.   FISH OIL PO Take 1 capsule by mouth daily. ( Mega Red )   furosemide  40 MG tablet Commonly known as:  LASIX Take 1 tablet (40 mg total) by mouth daily.   losartan 100 MG tablet Commonly known as:  COZAAR Take 1 tablet (100 mg total) by mouth daily.   LUPRON DEPOT IM Inject 1 each into the muscle every 6 (six) months.   multivitamin per tablet Take 1 tablet by mouth daily. ( with B - Complex )   ondansetron 4 MG disintegrating tablet Commonly known as:  ZOFRAN-ODT Take 1 tablet (4 mg total) by mouth every 8 (eight) hours as needed for nausea or vomiting.   oxybutynin 5 MG tablet Commonly known as:  DITROPAN Take 5 mg by mouth daily as needed for bladder spasms.   PROBIOTIC ADVANCED PO Take 1 capsule by mouth daily.   ranitidine 150 MG tablet Commonly known as:  ZANTAC Take 150 mg by mouth at bedtime.   warfarin 5 MG  tablet Commonly known as:  COUMADIN Take as directed. If you are unsure how to take this medication, talk to your nurse or doctor. Original instructions:  TAKE 1 TABLET BY MOUTH AS DIRECTED BY THE COUMADIN CLINIC What changed:    how much to take  how to take this  when to take this  additional instructions   XGEVA Villanueva Inject into the skin every 30 (thirty) days.   XTANDI 40 MG capsule Generic drug:  enzalutamide TAKE 1 CAPSULE (40 MG) BY MOUTH ONCE DAILY What changed:  See the new instructions.            Home Infusion Instuctions  (From admission, onward)         Start     Ordered   11/03/17 0000  Home infusion instructions [facility] May follow [facility] Dosing Protocol; May administer Cathflo as needed to maintain patency of vascular access device.; Flushing of vascular access device: per [facility] Protocol: 0.9% NaCl pre/post medica...    Question Answer Comment  Instructions May follow [facility] Dosing Protocol   Instructions May administer Cathflo as needed to maintain patency of vascular access device.   Instructions Flushing of vascular access device: per [facility] Protocol: 0.9% NaCl pre/post medication administration and prn patency; Heparin 100 u/ml, 35m for implanted ports and Heparin 10u/ml, 570mfor all other central venous catheters.   Instructions May follow [facility] Anaphylaxis Protocol for First Dose Administration in the home: 0.9% NaCl at 25-50 ml/hr to maintain IV access for protocol meds. Epinephrine 0.3 ml IV/IM PRN and Benadryl 25-50 IV/IM PRN s/s of anaphylaxis.   Instructions [facility] Infusion Coordinator (RN) to assist per patient IV care needs in the home PRN.      11/03/17 1126           Durable Medical Equipment  (From admission, onward)         Start     Ordered   11/03/17 1132  For home use only DME Other see comment  Once    Comments[facility]  Dx. Ambulatory dysfunction and high fall risk.   11/03/17 1132           Allergies  Allergen Reactions  . Pravachol Other (See Comments)    Muscle weakness  . Zocor [Simvastatin - High Dose] Other (See Comments)    Muscle weakness    Consultations:  ID   Procedures/Studies: Dg Chest Port 1 View  Result Date: 11/02/2017 CLINICAL DATA:  PICC placement EXAM: PORTABLE CHEST 1 VIEW COMPARISON:  10/29/2017, 07/17/2014 FINDINGS: Right upper  extremity catheter tip overlies the SVC. Cardiomegaly with trace right pleural effusion and airspace disease at the left base. Aortic atherosclerosis. No pneumothorax. IMPRESSION: 1. Right upper extremity catheter tip overlies the SVC. 2. Cardiomegaly 3. Probable tiny right effusion. Airspace disease at the left base which may reflect atelectasis or an infiltrate. Electronically Signed   By: Donavan Foil M.D.   On: 11/02/2017 18:20   Dg Chest Port 1 View  Result Date: 10/29/2017 CLINICAL DATA:  Altered mental status and fever. Ex-smoker. EXAM: PORTABLE CHEST 1 VIEW COMPARISON:  07/17/2014. FINDINGS: Stable enlarged cardiac silhouette and tortuous and calcified thoracic aorta. The interstitial markings remain mildly prominent. Stable small amount of linear scarring in the right mid lung zone. Thoracic spine degenerative changes. IMPRESSION: Stable cardiomegaly and mild chronic interstitial lung disease. No acute abnormality. Electronically Signed   By: Claudie Revering M.D.   On: 10/29/2017 19:46   [place] Ekg Site Rite  Result Date: 11/02/2017 If Site Rite image not attached, placement could not be confirmed due to current cardiac rhythm.      Subjective: Patient is feeling better this am, he did not rest well last night, no chest pain, no nausea or vomiting, no dyspnea.   Discharge Exam: Vitals:   11/03/17 0655 11/03/17 0934  BP: (!) 160/90 (!) 171/73  Pulse:    Resp:    Temp:    SpO2:     Vitals:   11/03/17 0526 11/03/17 0645 11/03/17 0655 11/03/17 0934  BP: (!) 193/102 (!) 175/104 (!) 160/90 (!) 171/73   Pulse: (!) 102 99    Resp: 16 18    Temp: 98.5 F (36.9 C) (!) 97.5 F (36.4 C)    TempSrc: Oral Oral    SpO2: 93% 93%    Weight:      Height:        General: Not in pain or dyspnea, deconditioned Neurology: Awake and alert, non focal  E ENT: mild pallor, no icterus, oral mucosa moist Cardiovascular: No JVD. S1-S2 present, rhythmic, no gallops, rubs, or murmurs. Trace lower extremity edema. Pulmonary: positive breath sounds bilaterally, no wheezing, rhonchi or rales. Gastrointestinal. Abdomen with no organomegaly, non tender, no rebound or guarding Skin. No rashes Musculoskeletal: no joint deformities   The results of significant diagnostics from this hospitalization (including imaging, microbiology, ancillary and laboratory) are listed below for reference.     Microbiology: Recent Results (from the past 240 hour(s))  Urine culture     Status: Abnormal   Collection Time: 10/29/17  6:03 PM  Result Value Ref Range Status   Specimen Description URINE, CATHETERIZED  Final   Special Requests   Final    NONE Performed at [facility], [address], [place], [place] [address]    Culture MULTIPLE SPECIES PRESENT, SUGGEST RECOLLECTION (A)  Final   Report Status 10/31/2017 FINAL  Final  Culture, blood (Routine x 2)     Status: Abnormal   Collection Time: 10/29/17  6:19 PM  Result Value Ref Range Status   Specimen Description BLOOD RIGHT WRIST  Final   Special Requests   Final    BOTTLES DRAWN AEROBIC AND ANAEROBIC Blood Culture adequate volume   Culture  Setup Time   Final    GRAM POSITIVE COCCI IN CLUSTERS IN BOTH AEROBIC AND ANAEROBIC BOTTLES CRITICAL RESULT CALLED TO, READ BACK BY AND VERIFIED WITH: A. MASTERS, PHARMD AT 1325 ON 10/30/17 BY C. JESSUP, MLT.    Culture (A)  Final  STAPHYLOCOCCUS LUGDUNENSIS SUSCEPTIBILITIES PERFORMED ON PREVIOUS CULTURE WITHIN THE LAST 5 DAYS. Performed at [facility], [place] [address], [place], [place] [address]    Report  Status 11/01/2017 FINAL  Final  Blood Culture ID Panel (Reflexed)     Status: Abnormal   Collection Time: 10/29/17  6:19 PM  Result Value Ref Range Status   Enterococcus species NOT DETECTED NOT DETECTED Final   Listeria monocytogenes NOT DETECTED NOT DETECTED Final   Staphylococcus species DETECTED (A) NOT DETECTED Final    Comment: Methicillin (oxacillin) susceptible coagulase negative staphylococcus. Possible blood culture contaminant (unless isolated from more than one blood culture draw or clinical case suggests pathogenicity). No antibiotic treatment is indicated for blood  culture contaminants. CRITICAL RESULT CALLED TO, READ BACK BY AND VERIFIED WITH: A. MASTERS, PHARMD AT 1325 ON 10/30/17 BY C. JESSUP, MLT.    Staphylococcus aureus NOT DETECTED NOT DETECTED Final   Methicillin resistance NOT DETECTED NOT DETECTED Final   Streptococcus species NOT DETECTED NOT DETECTED Final   Streptococcus agalactiae NOT DETECTED NOT DETECTED Final   Streptococcus pneumoniae NOT DETECTED NOT DETECTED Final   Streptococcus pyogenes NOT DETECTED NOT DETECTED Final   Acinetobacter baumannii NOT DETECTED NOT DETECTED Final   Enterobacteriaceae species NOT DETECTED NOT DETECTED Final   Enterobacter cloacae complex NOT DETECTED NOT DETECTED Final   Escherichia coli NOT DETECTED NOT DETECTED Final   Klebsiella oxytoca NOT DETECTED NOT DETECTED Final   Klebsiella pneumoniae NOT DETECTED NOT DETECTED Final   Proteus species NOT DETECTED NOT DETECTED Final   Serratia marcescens NOT DETECTED NOT DETECTED Final   Haemophilus influenzae NOT DETECTED NOT DETECTED Final   Neisseria meningitidis NOT DETECTED NOT DETECTED Final   Pseudomonas aeruginosa NOT DETECTED NOT DETECTED Final   Candida albicans NOT DETECTED NOT DETECTED Final   Candida glabrata NOT DETECTED NOT DETECTED Final   Candida krusei NOT DETECTED NOT DETECTED Final   Candida parapsilosis NOT DETECTED NOT DETECTED Final   Candida tropicalis NOT  DETECTED NOT DETECTED Final    Comment: Performed at [facility], [address], [place], [place] [address]  Culture, blood (Routine x 2)     Status: Abnormal   Collection Time: 10/29/17  7:15 PM  Result Value Ref Range Status   Specimen Description BLOOD RIGHT FOREARM  Final   Special Requests   Final    BOTTLES DRAWN AEROBIC AND ANAEROBIC Blood Culture adequate volume   Culture  Setup Time   Final    GRAM POSITIVE COCCI IN BOTH AEROBIC AND ANAEROBIC BOTTLES CRITICAL RESULT CALLED TO, READ BACK BY AND VERIFIED WITH: A. MASTERS, PHARMD AT 1325 ON 10/30/17 BY C. JESSUP, MLT.    Culture STAPHYLOCOCCUS LUGDUNENSIS (A)  Final   Report Status 11/01/2017 FINAL  Final   Organism ID, Bacteria STAPHYLOCOCCUS LUGDUNENSIS  Final      Susceptibility   Staphylococcus lugdunensis - MIC*    CIPROFLOXACIN <=0.5 SENSITIVE Sensitive     ERYTHROMYCIN <=0.25 SENSITIVE Sensitive     GENTAMICIN <=0.5 SENSITIVE Sensitive     OXACILLIN 1 SENSITIVE Sensitive     TETRACYCLINE <=1 SENSITIVE Sensitive     VANCOMYCIN <=0.5 SENSITIVE Sensitive     TRIMETH/SULFA <=10 SENSITIVE Sensitive     CLINDAMYCIN <=0.25 SENSITIVE Sensitive     RIFAMPIN <=0.5 SENSITIVE Sensitive     Inducible Clindamycin NEGATIVE Sensitive     * STAPHYLOCOCCUS LUGDUNENSIS  Culture, blood (Routine X 2) w Reflex to ID Panel  Status: None (Preliminary result)   Collection Time: 10/30/17  2:48 PM  Result Value Ref Range Status   Specimen Description BLOOD RIGHT ARM  Final   Special Requests   Final    BOTTLES DRAWN AEROBIC AND ANAEROBIC Blood Culture results may not be optimal due to an inadequate volume of blood received in culture bottles   Culture   Final    NO GROWTH 4 DAYS Performed at [facility], [place] [address] [place], [place] [address]    Report Status PENDING  Incomplete  Culture, blood (Routine X 2) w Reflex to ID Panel     Status: None (Preliminary result)   Collection Time: 10/30/17  2:59 PM  Result Value  Ref Range Status   Specimen Description BLOOD LEFT ARM  Final   Special Requests   Final    BOTTLES DRAWN AEROBIC AND ANAEROBIC Blood Culture adequate volume   Culture   Final    NO GROWTH 4 DAYS Performed at [facility], [address], [place], [place] [address]    Report Status PENDING  Incomplete  Culture, Urine     Status: None   Collection Time: 10/31/17  8:09 PM  Result Value Ref Range Status   Specimen Description URINE, CATHETERIZED  Final   Special Requests   Final    NONE Performed at [facility], [place] [address] [place], [place] [address]    Culture NO GROWTH  Final   Report Status 11/02/2017 FINAL  Final     Labs: BNP (last 3 results) No results for input(s): BNP in the last 8760 hours. Basic Metabolic Panel: Recent Labs  Lab 10/30/17 0541 10/31/17 0657 11/01/17 0624 11/02/17 0551 11/03/17 0451  NA 140 140 138 138 138  K 4.0 4.3 4.2 4.1 4.0  CL 107 107 104 104 104  CO2 _0 GLUCOSE 116* 102* 126* 114* 121*  BUN 46* 45* 38* 31* 28*  CREATININE 1.38* 1.37* 1.25* 1.21 1.14  CALCIUM 9.3 9.2 9.3 9.2 9.5  MG 1.9 2.0 2.2 2.0 2.1   Liver Function Tests: Recent Labs  Lab 10/29/17 1819 11/01/17 0624  AST 30 20  ALT 12 16  ALKPHOS 55 59  BILITOT 1.5* 0.9  PROT 6.6 6.1*  ALBUMIN 3.1* 2.5*   No results for input(s): LIPASE, AMYLASE in the last 168 hours. No results for input(s): AMMONIA in the last 168 hours. CBC: Recent Labs  Lab 10/30/17 0541 10/31/17 0657 11/01/17 0624 11/02/17 0551 11/03/17 0451  WBC 14.0* 9.3 9.0 7.5 8.6  NEUTROABS 10.9* 6.7 6.5 5.0 5.9  HGB 11.2* 11.6* 12.3* 11.9* 12.4*  HCT 35.2* 36.6* 38.3* 36.9* 38.2*  MCV 98.3 99.5 98.0 96.6 96.5  PLT 128* 125* 139* 144* 164   Cardiac Enzymes: No results for input(s): CKTOTAL, CKMB, CKMBINDEX, TROPONINI in the last 168 hours. BNP: Invalid input(s): POCBNP CBG: No results for input(s): GLUCAP in the last 168 hours. D-Dimer No results for input(s): DDIMER  in the last 72 hours. Hgb A1c No results for input(s): HGBA1C in the last 72 hours. Lipid Profile No results for input(s): CHOL, HDL, LDLCALC, TRIG, CHOLHDL, LDLDIRECT in the last 72 hours. Thyroid function studies No results for input(s): TSH, T4TOTAL, T3FREE, THYROIDAB in the last 72 hours.  Invalid input(s): FREET3 Anemia work up No results for input(s): VITAMINB12, FOLATE, FERRITIN, TIBC, IRON, RETICCTPCT in the last 72 hours. Urinalysis    Component Value Date/Time   COLORURINE AMBER (A)  10/29/2017 1803   APPEARANCEUR HAZY (A) 10/29/2017 1803   LABSPEC 1.015 10/29/2017 1803   PHURINE 5.0 10/29/2017 1803   GLUCOSEU NEGATIVE 10/29/2017 1803   HGBUR LARGE (A) 10/29/2017 1803   BILIRUBINUR NEGATIVE 10/29/2017 1803   KETONESUR NEGATIVE 10/29/2017 1803   PROTEINUR NEGATIVE 10/29/2017 1803   UROBILINOGEN 0.2 12/31/2014 1500   NITRITE NEGATIVE 10/29/2017 1803   LEUKOCYTESUR MODERATE (A) 10/29/2017 1803   Sepsis Labs Invalid input(s): PROCALCITONIN,  WBC,  LACTICIDVEN Microbiology Recent Results (from the past 240 hour(s))  Urine culture     Status: Abnormal   Collection Time: 10/29/17  6:03 PM  Result Value Ref Range Status   Specimen Description URINE, CATHETERIZED  Final   Special Requests   Final    NONE Performed at [facility], [address], [place], [place] [address]    Culture MULTIPLE SPECIES PRESENT, SUGGEST RECOLLECTION (A)  Final   Report Status 10/31/2017 FINAL  Final  Culture, blood (Routine x 2)     Status: Abnormal   Collection Time: 10/29/17  6:19 PM  Result Value Ref Range Status   Specimen Description BLOOD RIGHT WRIST  Final   Special Requests   Final    BOTTLES DRAWN AEROBIC AND ANAEROBIC Blood Culture adequate volume   Culture  Setup Time   Final    GRAM POSITIVE COCCI IN CLUSTERS IN BOTH AEROBIC AND ANAEROBIC BOTTLES CRITICAL RESULT CALLED TO, READ BACK BY AND VERIFIED WITH: A. MASTERS, PHARMD AT 1325 ON 10/30/17 BY C. JESSUP, MLT.     Culture (A)  Final    STAPHYLOCOCCUS LUGDUNENSIS SUSCEPTIBILITIES PERFORMED ON PREVIOUS CULTURE WITHIN THE LAST 5 DAYS. Performed at [facility], [place] [address], [place], [place] [address]    Report Status 11/01/2017 FINAL  Final  Blood Culture ID Panel (Reflexed)     Status: Abnormal   Collection Time: 10/29/17  6:19 PM  Result Value Ref Range Status   Enterococcus species NOT DETECTED NOT DETECTED Final   Listeria monocytogenes NOT DETECTED NOT DETECTED Final   Staphylococcus species DETECTED (A) NOT DETECTED Final    Comment: Methicillin (oxacillin) susceptible coagulase negative staphylococcus. Possible blood culture contaminant (unless isolated from more than one blood culture draw or clinical case suggests pathogenicity). No antibiotic treatment is indicated for blood  culture contaminants. CRITICAL RESULT CALLED TO, READ BACK BY AND VERIFIED WITH: A. MASTERS, PHARMD AT 1325 ON 10/30/17 BY C. JESSUP, MLT.    Staphylococcus aureus NOT DETECTED NOT DETECTED Final   Methicillin resistance NOT DETECTED NOT DETECTED Final   Streptococcus species NOT DETECTED NOT DETECTED Final   Streptococcus agalactiae NOT DETECTED NOT DETECTED Final   Streptococcus pneumoniae NOT DETECTED NOT DETECTED Final   Streptococcus pyogenes NOT DETECTED NOT DETECTED Final   Acinetobacter baumannii NOT DETECTED NOT DETECTED Final   Enterobacteriaceae species NOT DETECTED NOT DETECTED Final   Enterobacter cloacae complex NOT DETECTED NOT DETECTED Final   Escherichia coli NOT DETECTED NOT DETECTED Final   Klebsiella oxytoca NOT DETECTED NOT DETECTED Final   Klebsiella pneumoniae NOT DETECTED NOT DETECTED Final   Proteus species NOT DETECTED NOT DETECTED Final   Serratia marcescens NOT DETECTED NOT DETECTED Final   Haemophilus influenzae NOT DETECTED NOT DETECTED Final   Neisseria meningitidis NOT DETECTED NOT DETECTED Final   Pseudomonas aeruginosa NOT DETECTED NOT DETECTED Final   Candida albicans NOT  DETECTED NOT DETECTED Final   Candida glabrata NOT DETECTED NOT DETECTED Final   Candida krusei NOT DETECTED  NOT DETECTED Final   Candida parapsilosis NOT DETECTED NOT DETECTED Final   Candida tropicalis NOT DETECTED NOT DETECTED Final    Comment: Performed at [facility], [place] [address], [place], [place] [address]  Culture, blood (Routine x 2)     Status: Abnormal   Collection Time: 10/29/17  7:15 PM  Result Value Ref Range Status   Specimen Description BLOOD RIGHT FOREARM  Final   Special Requests   Final    BOTTLES DRAWN AEROBIC AND ANAEROBIC Blood Culture adequate volume   Culture  Setup Time   Final    GRAM POSITIVE COCCI IN BOTH AEROBIC AND ANAEROBIC BOTTLES CRITICAL RESULT CALLED TO, READ BACK BY AND VERIFIED WITH: A. MASTERS, PHARMD AT 1325 ON 10/30/17 BY C. JESSUP, MLT.    Culture STAPHYLOCOCCUS LUGDUNENSIS (A)  Final   Report Status 11/01/2017 FINAL  Final   Organism ID, Bacteria STAPHYLOCOCCUS LUGDUNENSIS  Final      Susceptibility   Staphylococcus lugdunensis - MIC*    CIPROFLOXACIN <=0.5 SENSITIVE Sensitive     ERYTHROMYCIN <=0.25 SENSITIVE Sensitive     GENTAMICIN <=0.5 SENSITIVE Sensitive     OXACILLIN 1 SENSITIVE Sensitive     TETRACYCLINE <=1 SENSITIVE Sensitive     VANCOMYCIN <=0.5 SENSITIVE Sensitive     TRIMETH/SULFA <=10 SENSITIVE Sensitive     CLINDAMYCIN <=0.25 SENSITIVE Sensitive     RIFAMPIN <=0.5 SENSITIVE Sensitive     Inducible Clindamycin NEGATIVE Sensitive     * STAPHYLOCOCCUS LUGDUNENSIS  Culture, blood (Routine X 2) w Reflex to ID Panel     Status: None (Preliminary result)   Collection Time: 10/30/17  2:48 PM  Result Value Ref Range Status   Specimen Description BLOOD RIGHT ARM  Final   Special Requests   Final    BOTTLES DRAWN AEROBIC AND ANAEROBIC Blood Culture results may not be optimal due to an inadequate volume of blood received in culture bottles   Culture   Final    NO GROWTH 4 DAYS Performed at [facility]  [address], [place], [place] [address]    Report Status PENDING  Incomplete  Culture, blood (Routine X 2) w Reflex to ID Panel     Status: None (Preliminary result)   Collection Time: 10/30/17  2:59 PM  Result Value Ref Range Status   Specimen Description BLOOD LEFT ARM  Final   Special Requests   Final    BOTTLES DRAWN AEROBIC AND ANAEROBIC Blood Culture adequate volume   Culture   Final    NO GROWTH 4 DAYS Performed at [facility], [address], [place], [place] [address]    Report Status PENDING  Incomplete  Culture, Urine     Status: None   Collection Time: 10/31/17  8:09 PM  Result Value Ref Range Status   Specimen Description URINE, CATHETERIZED  Final   Special Requests   Final    NONE Performed at [facility], [place] [address] [place], [place] [address]    Culture NO GROWTH  Final   Report Status 11/02/2017 FINAL  Final     Time coordinating discharge: 45 minutes  SIGNED:   Tawni Millers, MD  [facility] 11/03/2017, 11:02 AM Pager [phone]  If 7PM-7AM, please contact night-coverage [email or web address] Password [email or web address]

## 2017-11-03 NOTE — Progress Notes (Signed)
ANTICOAGULATION CONSULT NOTE - Follow-up Consult  Pharmacy Consult for Coumadin Indication: atrial fibrillation  Allergies  Allergen Reactions  . Pravachol Other (See Comments)    Muscle weakness  . Zocor [Simvastatin - High Dose] Other (See Comments)    Muscle weakness    Patient Measurements: Height: 6' (182.9 cm) Weight: 208 lb 5.4 oz (94.5 kg) IBW/kg (Calculated) : 77.6  Vital Signs: Temp: 97.5 F (36.4 C) (09/12 0645) Temp Source: Oral (09/12 0645) BP: 160/90 (09/12 0655) Pulse Rate: 99 (09/12 0645)  Labs: Recent Labs    11/01/17 0624 11/02/17 0551 11/03/17 0451  HGB 12.3* 11.9* 12.4*  HCT 38.3* 36.9* 38.2*  PLT 139* 144* 164  LABPROT 24.8* 27.7* 25.9*  INR 2.26 2.60 2.39  CREATININE 1.25* 1.21 1.14    Estimated Creatinine Clearance: 51.4 mL/min (by C-G formula based on SCr of 1.14 mg/dL).   Assessment: 84 yo M on Coumadin 2.5mg  daily except for 5mg  on Mon/Thurs/Sat for hx Afib, hx CVA/ICH in 1999.  INR 2.39 (supratherapeutic) for new lower goal of 1.8-2.2 (per MD) - dose held yesterday. Hgb stable, plts back to nl. No bleeding noted.   Goal of Therapy:  INR 1.8-2.2 per MD Monitor platelets by anticoagulation protocol: Yes   Plan:  Coumadin 1mg  po tonight Monitor daily INR, CBC, s/s of bleed  Sherlon Handing, PharmD, BCPS Clinical pharmacist  **Pharmacist phone directory can now be found on [email or web address] (PW [email or web address]).  Listed under [place].  11/03/2017,7:31 AM

## 2017-11-04 ENCOUNTER — Other Ambulatory Visit: Payer: Self-pay

## 2017-11-04 DIAGNOSIS — T83511A Infection and inflammatory reaction due to indwelling urethral catheter, initial encounter: Secondary | ICD-10-CM | POA: Diagnosis not present

## 2017-11-04 DIAGNOSIS — R630 Anorexia: Secondary | ICD-10-CM

## 2017-11-04 DIAGNOSIS — Z8673 Personal history of transient ischemic attack (TIA), and cerebral infarction without residual deficits: Secondary | ICD-10-CM | POA: Diagnosis not present

## 2017-11-04 LAB — CULTURE, BLOOD (ROUTINE X 2)
CULTURE: NO GROWTH
CULTURE: NO GROWTH
Special Requests: ADEQUATE

## 2017-11-04 NOTE — Progress Notes (Signed)
Referral per Dr. Raliegh Scarlet. MPulliam, CMA/RT(R)

## 2017-11-04 NOTE — Telephone Encounter (Signed)
Needs OV- extended time f/up since not seen since 2017- ALMOST 2 yrs and need to address multiple things

## 2017-11-04 NOTE — Telephone Encounter (Signed)
Spoke to wife she states that on good days she is able to get 1200 calories in the patient.  Patient eats a few bites and then states that he is full.  Patient has been to dietistion  in the past.  Wife wants to know if there is something she can give him orally that will help increase his appetite. Please advise

## 2017-11-07 DIAGNOSIS — N390 Urinary tract infection, site not specified: Secondary | ICD-10-CM | POA: Diagnosis not present

## 2017-11-07 DIAGNOSIS — I38 Endocarditis, valve unspecified: Secondary | ICD-10-CM | POA: Diagnosis not present

## 2017-11-07 DIAGNOSIS — B957 Other staphylococcus as the cause of diseases classified elsewhere: Secondary | ICD-10-CM | POA: Diagnosis not present

## 2017-11-07 DIAGNOSIS — I480 Paroxysmal atrial fibrillation: Secondary | ICD-10-CM | POA: Diagnosis not present

## 2017-11-07 DIAGNOSIS — B9689 Other specified bacterial agents as the cause of diseases classified elsewhere: Secondary | ICD-10-CM | POA: Diagnosis not present

## 2017-11-07 DIAGNOSIS — Z5181 Encounter for therapeutic drug level monitoring: Secondary | ICD-10-CM | POA: Diagnosis not present

## 2017-11-07 DIAGNOSIS — Z8673 Personal history of transient ischemic attack (TIA), and cerebral infarction without residual deficits: Secondary | ICD-10-CM | POA: Diagnosis not present

## 2017-11-07 DIAGNOSIS — Z452 Encounter for adjustment and management of vascular access device: Secondary | ICD-10-CM | POA: Diagnosis not present

## 2017-11-07 DIAGNOSIS — T83511A Infection and inflammatory reaction due to indwelling urethral catheter, initial encounter: Secondary | ICD-10-CM | POA: Diagnosis not present

## 2017-11-07 DIAGNOSIS — I5032 Chronic diastolic (congestive) heart failure: Secondary | ICD-10-CM | POA: Diagnosis not present

## 2017-11-07 DIAGNOSIS — I130 Hypertensive heart and chronic kidney disease with heart failure and stage 1 through stage 4 chronic kidney disease, or unspecified chronic kidney disease: Secondary | ICD-10-CM | POA: Diagnosis not present

## 2017-11-07 DIAGNOSIS — N183 Chronic kidney disease, stage 3 (moderate): Secondary | ICD-10-CM | POA: Diagnosis not present

## 2017-11-07 DIAGNOSIS — C7951 Secondary malignant neoplasm of bone: Secondary | ICD-10-CM | POA: Diagnosis not present

## 2017-11-07 DIAGNOSIS — Z7901 Long term (current) use of anticoagulants: Secondary | ICD-10-CM | POA: Diagnosis not present

## 2017-11-07 NOTE — Telephone Encounter (Signed)
Patient's wife notified.  She states that the patient is bed bound at the moment and is unable to come in for a follow up at this time.  She will back a follow up as soon as patient is finished with the IV antibiotics.  Patient's wife is feeding the patient small amounts anytime he will eat - abt 5 times a day.  Wife would like Dr. Raliegh Scarlet to review notes from other providers and hospital as she plans to bring him in for an office visit as soon as she can get him here. MPulliam, CMA/RT(R)

## 2017-11-08 ENCOUNTER — Telehealth: Payer: Self-pay | Admitting: *Deleted

## 2017-11-08 NOTE — Telephone Encounter (Signed)
Patient wife called to report that since being home on the Cefazolin Saturday the patients urine output dropped to 300 ccall day. Sunday it was up to 400 cc she gave him a lasix and it jumped up to 1600 cc. She wants to know if the antibiotic could cause urinary retention and if so what she is to look for and when to give him another lasix. She also reports that he does not feel well and his BP has been about 150/60 since Saturday as well. He does not have a follow up here as it is to hard to get him around. Will send Dr Tommy Medal a message and see what he advises.

## 2017-11-09 DIAGNOSIS — Z8673 Personal history of transient ischemic attack (TIA), and cerebral infarction without residual deficits: Secondary | ICD-10-CM | POA: Diagnosis not present

## 2017-11-09 DIAGNOSIS — T83511A Infection and inflammatory reaction due to indwelling urethral catheter, initial encounter: Secondary | ICD-10-CM | POA: Diagnosis not present

## 2017-11-09 NOTE — Telephone Encounter (Signed)
Thanks Darnelle Maffucci, cefazolin DOES NOT cause urinary retention at all. His kidney fxn will be monitored on the abx and dose adjusted if needed but I am not very worried about cefazolin here. Vancomycin would be another matter

## 2017-11-10 NOTE — Telephone Encounter (Signed)
Per Dr Tommy Medal response called the patient and his wife to advise Cefazolin does not cause urinary retention and he should check in with his PCP if he is still having the issue. Wife advised they had actually talked to the patient cardiologist and he has adjusted the lasix Rx and the patient is doing much better. Advised will let the provider know.

## 2017-11-11 ENCOUNTER — Other Ambulatory Visit: Payer: Self-pay | Admitting: Oncology

## 2017-11-11 DIAGNOSIS — C61 Malignant neoplasm of prostate: Secondary | ICD-10-CM

## 2017-11-11 DIAGNOSIS — C7951 Secondary malignant neoplasm of bone: Secondary | ICD-10-CM

## 2017-11-14 ENCOUNTER — Ambulatory Visit (INDEPENDENT_AMBULATORY_CARE_PROVIDER_SITE_OTHER): Payer: Medicare Other | Admitting: Cardiology

## 2017-11-14 ENCOUNTER — Telehealth: Payer: Self-pay | Admitting: Interventional Cardiology

## 2017-11-14 ENCOUNTER — Inpatient Hospital Stay (HOSPITAL_COMMUNITY)
Admission: EM | Admit: 2017-11-14 | Discharge: 2017-11-18 | DRG: 291 | Disposition: A | Discharge status: Skilled Nursing Facility | Payer: Medicare Other | Attending: Internal Medicine | Admitting: Internal Medicine

## 2017-11-14 ENCOUNTER — Encounter (HOSPITAL_COMMUNITY): Payer: Self-pay | Admitting: Emergency Medicine

## 2017-11-14 ENCOUNTER — Emergency Department (HOSPITAL_COMMUNITY): Payer: Medicare Other

## 2017-11-14 DIAGNOSIS — R0600 Dyspnea, unspecified: Secondary | ICD-10-CM | POA: Diagnosis not present

## 2017-11-14 DIAGNOSIS — Z79818 Long term (current) use of other agents affecting estrogen receptors and estrogen levels: Secondary | ICD-10-CM

## 2017-11-14 DIAGNOSIS — T83511A Infection and inflammatory reaction due to indwelling urethral catheter, initial encounter: Secondary | ICD-10-CM | POA: Diagnosis not present

## 2017-11-14 DIAGNOSIS — E785 Hyperlipidemia, unspecified: Secondary | ICD-10-CM | POA: Diagnosis present

## 2017-11-14 DIAGNOSIS — J9601 Acute respiratory failure with hypoxia: Secondary | ICD-10-CM | POA: Diagnosis not present

## 2017-11-14 DIAGNOSIS — Z7901 Long term (current) use of anticoagulants: Secondary | ICD-10-CM

## 2017-11-14 DIAGNOSIS — R0602 Shortness of breath: Secondary | ICD-10-CM | POA: Diagnosis not present

## 2017-11-14 DIAGNOSIS — I10 Essential (primary) hypertension: Secondary | ICD-10-CM | POA: Diagnosis present

## 2017-11-14 DIAGNOSIS — Z792 Long term (current) use of antibiotics: Secondary | ICD-10-CM

## 2017-11-14 DIAGNOSIS — I700 Atherosclerosis of aorta: Secondary | ICD-10-CM | POA: Diagnosis not present

## 2017-11-14 DIAGNOSIS — J984 Other disorders of lung: Secondary | ICD-10-CM | POA: Diagnosis not present

## 2017-11-14 DIAGNOSIS — I4729 Other ventricular tachycardia: Secondary | ICD-10-CM

## 2017-11-14 DIAGNOSIS — T447X5A Adverse effect of beta-adrenoreceptor antagonists, initial encounter: Secondary | ICD-10-CM | POA: Diagnosis not present

## 2017-11-14 DIAGNOSIS — I082 Rheumatic disorders of both aortic and tricuspid valves: Secondary | ICD-10-CM | POA: Diagnosis not present

## 2017-11-14 DIAGNOSIS — I130 Hypertensive heart and chronic kidney disease with heart failure and stage 1 through stage 4 chronic kidney disease, or unspecified chronic kidney disease: Principal | ICD-10-CM | POA: Diagnosis present

## 2017-11-14 DIAGNOSIS — I444 Left anterior fascicular block: Secondary | ICD-10-CM | POA: Diagnosis not present

## 2017-11-14 DIAGNOSIS — R402143 Coma scale, eyes open, spontaneous, at hospital admission: Secondary | ICD-10-CM | POA: Diagnosis not present

## 2017-11-14 DIAGNOSIS — Z8673 Personal history of transient ischemic attack (TIA), and cerebral infarction without residual deficits: Secondary | ICD-10-CM | POA: Diagnosis not present

## 2017-11-14 DIAGNOSIS — C799 Secondary malignant neoplasm of unspecified site: Secondary | ICD-10-CM | POA: Diagnosis present

## 2017-11-14 DIAGNOSIS — I5033 Acute on chronic diastolic (congestive) heart failure: Secondary | ICD-10-CM | POA: Diagnosis not present

## 2017-11-14 DIAGNOSIS — R627 Adult failure to thrive: Secondary | ICD-10-CM | POA: Diagnosis not present

## 2017-11-14 DIAGNOSIS — Z743 Need for continuous supervision: Secondary | ICD-10-CM | POA: Diagnosis not present

## 2017-11-14 DIAGNOSIS — Z87891 Personal history of nicotine dependence: Secondary | ICD-10-CM | POA: Diagnosis not present

## 2017-11-14 DIAGNOSIS — R0902 Hypoxemia: Secondary | ICD-10-CM | POA: Diagnosis not present

## 2017-11-14 DIAGNOSIS — I482 Chronic atrial fibrillation, unspecified: Secondary | ICD-10-CM | POA: Diagnosis present

## 2017-11-14 DIAGNOSIS — E7800 Pure hypercholesterolemia, unspecified: Secondary | ICD-10-CM | POA: Diagnosis present

## 2017-11-14 DIAGNOSIS — R1310 Dysphagia, unspecified: Secondary | ICD-10-CM | POA: Diagnosis present

## 2017-11-14 DIAGNOSIS — J690 Pneumonitis due to inhalation of food and vomit: Secondary | ICD-10-CM | POA: Diagnosis not present

## 2017-11-14 DIAGNOSIS — G8929 Other chronic pain: Secondary | ICD-10-CM | POA: Diagnosis present

## 2017-11-14 DIAGNOSIS — R402363 Coma scale, best motor response, obeys commands, at hospital admission: Secondary | ICD-10-CM | POA: Diagnosis present

## 2017-11-14 DIAGNOSIS — Z5181 Encounter for therapeutic drug level monitoring: Secondary | ICD-10-CM

## 2017-11-14 DIAGNOSIS — Z8679 Personal history of other diseases of the circulatory system: Secondary | ICD-10-CM

## 2017-11-14 DIAGNOSIS — Z95828 Presence of other vascular implants and grafts: Secondary | ICD-10-CM

## 2017-11-14 DIAGNOSIS — R7881 Bacteremia: Secondary | ICD-10-CM | POA: Diagnosis present

## 2017-11-14 DIAGNOSIS — Z960 Presence of urogenital implants: Secondary | ICD-10-CM | POA: Diagnosis present

## 2017-11-14 DIAGNOSIS — R011 Cardiac murmur, unspecified: Secondary | ICD-10-CM | POA: Diagnosis not present

## 2017-11-14 DIAGNOSIS — Y92230 Patient room in hospital as the place of occurrence of the external cause: Secondary | ICD-10-CM | POA: Diagnosis not present

## 2017-11-14 DIAGNOSIS — I472 Ventricular tachycardia: Secondary | ICD-10-CM | POA: Diagnosis not present

## 2017-11-14 DIAGNOSIS — N183 Chronic kidney disease, stage 3 unspecified: Secondary | ICD-10-CM

## 2017-11-14 DIAGNOSIS — K5900 Constipation, unspecified: Secondary | ICD-10-CM

## 2017-11-14 DIAGNOSIS — I272 Pulmonary hypertension, unspecified: Secondary | ICD-10-CM | POA: Diagnosis not present

## 2017-11-14 DIAGNOSIS — C61 Malignant neoplasm of prostate: Secondary | ICD-10-CM | POA: Diagnosis present

## 2017-11-14 DIAGNOSIS — Z923 Personal history of irradiation: Secondary | ICD-10-CM

## 2017-11-14 DIAGNOSIS — R001 Bradycardia, unspecified: Secondary | ICD-10-CM | POA: Diagnosis not present

## 2017-11-14 DIAGNOSIS — Z8249 Family history of ischemic heart disease and other diseases of the circulatory system: Secondary | ICD-10-CM | POA: Diagnosis not present

## 2017-11-14 DIAGNOSIS — Z888 Allergy status to other drugs, medicaments and biological substances status: Secondary | ICD-10-CM

## 2017-11-14 DIAGNOSIS — B957 Other staphylococcus as the cause of diseases classified elsewhere: Secondary | ICD-10-CM | POA: Diagnosis present

## 2017-11-14 DIAGNOSIS — A4901 Methicillin susceptible Staphylococcus aureus infection, unspecified site: Secondary | ICD-10-CM | POA: Diagnosis not present

## 2017-11-14 DIAGNOSIS — M549 Dorsalgia, unspecified: Secondary | ICD-10-CM | POA: Diagnosis present

## 2017-11-14 DIAGNOSIS — R402253 Coma scale, best verbal response, oriented, at hospital admission: Secondary | ICD-10-CM | POA: Diagnosis not present

## 2017-11-14 DIAGNOSIS — Z79899 Other long term (current) drug therapy: Secondary | ICD-10-CM

## 2017-11-14 LAB — PROTIME-INR
INR: 1.89
PROTHROMBIN TIME: 21.5 s — AB (ref 11.4–15.2)

## 2017-11-14 LAB — CBC WITH DIFFERENTIAL/PLATELET
Abs Immature Granulocytes: 0 10*3/uL (ref 0.0–0.1)
BASOS ABS: 0.1 10*3/uL (ref 0.0–0.1)
BASOS PCT: 1 %
Eosinophils Absolute: 0.2 10*3/uL (ref 0.0–0.7)
Eosinophils Relative: 2 %
HCT: 43.7 % (ref 39.0–52.0)
Hemoglobin: 13.5 g/dL (ref 13.0–17.0)
Immature Granulocytes: 0 %
LYMPHS PCT: 17 %
Lymphs Abs: 1.3 10*3/uL (ref 0.7–4.0)
MCH: 31.3 pg (ref 26.0–34.0)
MCHC: 30.9 g/dL (ref 30.0–36.0)
MCV: 101.2 fL — ABNORMAL HIGH (ref 78.0–100.0)
Monocytes Absolute: 0.6 10*3/uL (ref 0.1–1.0)
Monocytes Relative: 8 %
NEUTROS PCT: 72 %
Neutro Abs: 5.6 10*3/uL (ref 1.7–7.7)
PLATELETS: 199 10*3/uL (ref 150–400)
RBC: 4.32 MIL/uL (ref 4.22–5.81)
RDW: 15.9 % — ABNORMAL HIGH (ref 11.5–15.5)
WBC: 7.8 10*3/uL (ref 4.0–10.5)

## 2017-11-14 LAB — COMPREHENSIVE METABOLIC PANEL
ALK PHOS: 62 U/L (ref 38–126)
ALT: <5 U/L (ref 0–44)
ANION GAP: 13 (ref 5–15)
AST: 18 U/L (ref 15–41)
Albumin: 3 g/dL — ABNORMAL LOW (ref 3.5–5.0)
BILIRUBIN TOTAL: 0.8 mg/dL (ref 0.3–1.2)
BUN: 24 mg/dL — ABNORMAL HIGH (ref 8–23)
CALCIUM: 10.9 mg/dL — AB (ref 8.9–10.3)
CO2: 31 mmol/L (ref 22–32)
Chloride: 97 mmol/L — ABNORMAL LOW (ref 98–111)
Creatinine, Ser: 1.21 mg/dL (ref 0.61–1.24)
GFR, EST AFRICAN AMERICAN: 59 mL/min — AB (ref >60)
GFR, EST NON AFRICAN AMERICAN: 51 mL/min — AB (ref >60)
Glucose, Bld: 107 mg/dL — ABNORMAL HIGH (ref 70–99)
Potassium: 3.8 mmol/L (ref 3.5–5.1)
Sodium: 141 mmol/L (ref 135–145)
TOTAL PROTEIN: 7.1 g/dL (ref 6.5–8.1)

## 2017-11-14 LAB — URINALYSIS, ROUTINE W REFLEX MICROSCOPIC
Bilirubin Urine: NEGATIVE
GLUCOSE, UA: NEGATIVE mg/dL
KETONES UR: 5 mg/dL — AB
Leukocytes, UA: NEGATIVE
NITRITE: NEGATIVE
PROTEIN: 100 mg/dL — AB
Specific Gravity, Urine: 1.024 (ref 1.005–1.030)
pH: 5 (ref 5.0–8.0)

## 2017-11-14 LAB — POCT INR: INR: 2.1 (ref 2.0–3.0)

## 2017-11-14 LAB — I-STAT TROPONIN, ED: TROPONIN I, POC: 0 ng/mL (ref 0.00–0.08)

## 2017-11-14 LAB — BRAIN NATRIURETIC PEPTIDE: B NATRIURETIC PEPTIDE 5: 897.7 pg/mL — AB (ref 0.0–100.0)

## 2017-11-14 MED ORDER — ASPIRIN EC 81 MG PO TBEC
81.0000 mg | DELAYED_RELEASE_TABLET | Freq: Every day | ORAL | Status: DC
  Administered 2017-11-14 – 2017-11-18 (×5): 81 mg via ORAL
  Filled 2017-11-14 (×5): qty 1

## 2017-11-14 MED ORDER — WARFARIN SODIUM 5 MG PO TABS
5.0000 mg | ORAL_TABLET | ORAL | Status: DC
  Filled 2017-11-14: qty 1

## 2017-11-14 MED ORDER — ONDANSETRON HCL 4 MG/2ML IJ SOLN: 4.0000 mg | Freq: Four times a day (QID) | INTRAMUSCULAR | Status: DC | PRN

## 2017-11-14 MED ORDER — CEFAZOLIN SODIUM-DEXTROSE 2-4 GM/100ML-% IV SOLN
2.0000 g | Freq: Once | INTRAVENOUS | Status: AC
  Administered 2017-11-14: 2 g via INTRAVENOUS
  Filled 2017-11-14: qty 100

## 2017-11-14 MED ORDER — SODIUM CHLORIDE 0.9% FLUSH
3.0000 mL | Freq: Two times a day (BID) | INTRAVENOUS | Status: DC
  Administered 2017-11-14 – 2017-11-18 (×3): 3 mL via INTRAVENOUS

## 2017-11-14 MED ORDER — LOSARTAN POTASSIUM 50 MG PO TABS
100.0000 mg | ORAL_TABLET | Freq: Every day | ORAL | Status: DC
  Administered 2017-11-15 – 2017-11-18 (×4): 100 mg via ORAL
  Filled 2017-11-14 (×4): qty 2

## 2017-11-14 MED ORDER — EZETIMIBE 10 MG PO TABS
10.0000 mg | ORAL_TABLET | Freq: Every day | ORAL | Status: DC
  Administered 2017-11-15 – 2017-11-18 (×4): 10 mg via ORAL
  Filled 2017-11-14 (×4): qty 1

## 2017-11-14 MED ORDER — SODIUM CHLORIDE 0.9 % IV SOLN
250.0000 mL | INTRAVENOUS | Status: DC | PRN
  Administered 2017-11-15: 250 mL via INTRAVENOUS

## 2017-11-14 MED ORDER — ACETAMINOPHEN 325 MG PO TABS
650.0000 mg | ORAL_TABLET | ORAL | Status: DC | PRN
  Administered 2017-11-17: 650 mg via ORAL
  Filled 2017-11-14: qty 2

## 2017-11-14 MED ORDER — ATORVASTATIN CALCIUM 10 MG PO TABS
10.0000 mg | ORAL_TABLET | Freq: Every day | ORAL | Status: DC
  Administered 2017-11-14 – 2017-11-18 (×5): 10 mg via ORAL
  Filled 2017-11-14 (×5): qty 1

## 2017-11-14 MED ORDER — OXYBUTYNIN CHLORIDE 5 MG PO TABS: 5.0000 mg | ORAL_TABLET | Freq: Every day | ORAL | Status: DC | PRN

## 2017-11-14 MED ORDER — WARFARIN - PHYSICIAN DOSING INPATIENT: Freq: Every day | Status: DC

## 2017-11-14 MED ORDER — WARFARIN SODIUM 2.5 MG PO TABS: 2.5000 mg | ORAL_TABLET | ORAL | Status: DC

## 2017-11-14 MED ORDER — CEFAZOLIN IV (FOR PTA / DISCHARGE USE ONLY): 2.0000 g | Freq: Three times a day (TID) | INTRAVENOUS | Status: DC

## 2017-11-14 MED ORDER — FUROSEMIDE 10 MG/ML IJ SOLN
60.0000 mg | Freq: Two times a day (BID) | INTRAMUSCULAR | Status: AC
  Administered 2017-11-14 – 2017-11-16 (×5): 60 mg via INTRAVENOUS
  Filled 2017-11-14 (×6): qty 6

## 2017-11-14 MED ORDER — CEFAZOLIN SODIUM-DEXTROSE 2-4 GM/100ML-% IV SOLN
2.0000 g | Freq: Three times a day (TID) | INTRAVENOUS | Status: DC
  Administered 2017-11-14 – 2017-11-18 (×11): 2 g via INTRAVENOUS
  Filled 2017-11-14 (×12): qty 100

## 2017-11-14 MED ORDER — AMLODIPINE BESYLATE 10 MG PO TABS
10.0000 mg | ORAL_TABLET | Freq: Every day | ORAL | Status: DC
  Administered 2017-11-14 – 2017-11-18 (×5): 10 mg via ORAL
  Filled 2017-11-14: qty 1
  Filled 2017-11-14: qty 2
  Filled 2017-11-14 (×3): qty 1

## 2017-11-14 MED ORDER — CARVEDILOL 3.125 MG PO TABS
3.1250 mg | ORAL_TABLET | Freq: Two times a day (BID) | ORAL | Status: DC
  Administered 2017-11-15 (×2): 3.125 mg via ORAL
  Filled 2017-11-14 (×3): qty 1

## 2017-11-14 MED ORDER — SODIUM CHLORIDE 0.9% FLUSH: 3.0000 mL | INTRAVENOUS | Status: DC | PRN

## 2017-11-14 NOTE — Telephone Encounter (Signed)
Craig [name], [facility] nurse, called to report the patient's BP has high systolic BP in the 886L and low apical HR at 44. She reports he has been taking extra Lasix because he's been having breathing issues. She states they are going to the ED for evaluation and wanted to notify Dr. Irish Lack.

## 2017-11-14 NOTE — Telephone Encounter (Signed)
Spoke to patient's wife [name] who called to inform us that the patient has become extremely SOB over the last week.  He was hospitalized 9/7-9/12 treated for a staff infection. Last Sunday 9/15, he became SOB and was given an extra dose of Lasix, had large urine output and became much better with his SOB.    He was pretty good throughout the week until yesterday 9/22.  He became very SOB and restless and was given more Lasix.  The patient had another large output of urine, but SOB did not subside as easy.  The patient cannot walk and would need to be transported by ambulance to the office.  The home health nurse is coming by to draw labs and further evaluate the patient.  I advised a visit to the ED, because the patient has also developed CP this morning.  She verbalized understanding.

## 2017-11-14 NOTE — Telephone Encounter (Signed)
New  Message:     Pt c/o Shortness Of Breath: STAT if SOB developed within the last 24 hours or pt is noticeably SOB on the phone  1. Are you currently SOB (can you hear that pt is SOB on the phone)? No   2. How long have you been experiencing SOB? 1 Day   3. Are you SOB when sitting or when up moving around? Sitting   4. Are you currently experiencing any other symptoms? No

## 2017-11-14 NOTE — ED Notes (Signed)
ED Provider at bedside.with wife

## 2017-11-14 NOTE — ED Provider Notes (Signed)
[facility] EMERGENCY DEPARTMENT Provider Note   CSN: [id number] Arrival date & time: 11/14/17  1052     History   Chief Complaint Chief Complaint  Patient presents with  . Shortness of Breath    HPI Craig Moring. is a 84 y.o. male.  84 year old male with prior medical history as documented below presents for evaluation of reported shortness of breath.  59 of history is obtained from the patient's Craig [name].  Patient's Craig [name] reports the patient was recently discharged from this facility after being diagnosed with a bacteremia.  He is on standing doses of Ancef at home.  The patient's Craig [name] reportedly noticed that the patient had decreased p.o. intake over the last 24 hours.  She also reports the patient appeared to be short of breath.  No reported fevers at home.  No reported episodes of chest pain.  The history is provided by the patient, medical records and the spouse. The history is limited by the condition of the patient.  Shortness of Breath  This is a new problem. The average episode lasts 1 day. The problem occurs continuously.The current episode started yesterday. The problem has not changed since onset.Pertinent negatives include no fever, no chest pain and no leg swelling.    Past Medical History:  Diagnosis Date  . Atrial fibrillation ([place])   . Chronic back pain   . GERD (gastroesophageal reflux disease)   . H/O hiatal hernia   . History of epistaxis 07/19/2002  . Hyperlipidemia   . Hypertension   . Hypertensive heart disease   . Hypokalemia   . ICH (intracerebral hemorrhage) ([place]) ~ 1999   after TPA/notes 09/27/2012  . Melanoma ([place])    "top of my head" (09/27/2012)  . Migraines    "migraines without headaches years ago" (09/27/2012)  . Osteoarthritis of both feet   . PAT (paroxysmal atrial tachycardia) ([place])   . Personal history of long-term (current) use of anticoagulants   . Pneumonia    "once; several years ago" (09/27/2012)  . Prostate  cancer, primary, with metastasis from prostate to other site [facility])    on Lupron injections per GU  . S/P radiation therapy 02/13/14-02/27/14   SRS 5/5 spine completed 02/27/14  . S/P radiation therapy 05/27/14-05/31/14   right femur 20Gy/53f  . Stroke ([facility] ~ 1999   ischemic / right cerebellar/posterior inferior cerebellar artery / right pos infarct    Patient Active Problem List   Diagnosis Date Noted  . Endocarditis of native valve   . Bacteremia, coagulase-negative staphylococcal 11/01/2017  . UTI (urinary tract infection) 10/29/2017  . Bone cancer ([place] 12/01/2015  . History of recurrent TIAs 12/01/2015  . Chronic anticoagulation 12/01/2015  . Hearing difficulty 12/01/2015  . At high risk for complication of immobility 12/01/2015  . Aortic stenosis 07/15/2015  . Stroke ([place]   . Hemispheric carotid artery syndrome   . Cardiomyopathy, ischemic   . Stroke-like symptoms 12/31/2014  . Catheter-associated urinary tract infection ([place] 12/31/2014  . Hemoptysis 07/12/2014  . Hypertensive urgency 07/12/2014  . Pulmonary embolism ([place]   . Cough with hemoptysis   . Pulmonary embolus ([place] 07/11/2014  . Fever 04/22/2014  . Dysarthria 04/21/2014  . CVA (cerebral infarction) 04/19/2014  . Prostate cancer, primary, with metastasis from prostate to other site ([facility] 04/19/2014  . Chronic back pain 04/19/2014  . Expressive aphasia   . Bone metastasis ([place] 02/12/2014  . Chronic diastolic heart failure ([place] 02/11/2014  . Hypertensive heart disease 02/11/2014  .  Essential hypertension 02/11/2014  . History of stroke 02/11/2014  . AAA (abdominal aortic aneurysm) ([place]) 02/11/2014  . Hypokalemia 02/11/2014  . Back pain 02/09/2014  . Fluid overload 02/08/2014  . Left-sided weakness 02/08/2014  . Encounter for therapeutic drug monitoring 05/14/2013  . Chronic kidney disease (CKD), stage III (moderate) ([place]) 09/27/2012  . Elevated brain natriuretic peptide (BNP) level 09/27/2012  .  Hypercalcemia 09/27/2012  . Weakness 09/27/2012  . Epistaxis 01/08/2011  . Cerebellar stroke syndrome 07/27/2010  . Hypercholesterolemia 07/27/2010  . Prostate cancer metastatic to bone ([place]) 07/27/2010  . Benign hypertensive heart disease without heart failure 07/27/2010  . Renal insufficiency 07/27/2010  . Chronic atrial fibrillation ([place]) 05/25/2010    Past Surgical History:  Procedure Laterality Date  . CATARACT EXTRACTION W/ INTRAOCULAR LENS  IMPLANT, BILATERAL Bilateral ~ 2011  . MELANOMA EXCISION  2010   "pre-melanoma on top of head; did skin graft from left thigh to cover" (09/27/2012)  . NASAL SINUS SURGERY  1998  . SKIN GRAFT Right 2010  . TONSILLECTOMY  ~ [place] Medications    Prior to Admission medications   Medication Sig Start Date End Date Taking? Authorizing Provider  acetaminophen (TYLENOL) 500 MG tablet Take 1,000 mg by mouth daily.    Yes [provider]  amLODipine (NORVASC) 10 MG tablet Take 1 tablet (10 mg total) by mouth daily. 08/11/17  Yes Jettie Booze, MD  atorvastatin (LIPITOR) 10 MG tablet Take 1 tablet (10 mg total) by mouth daily. 08/11/17  Yes Jettie Booze, MD  B Complex-C (B-COMPLEX WITH VITAMIN C) tablet Take 1 tablet by mouth daily.     Yes [provider]  calcium carbonate (TUMS - DOSED IN MG ELEMENTAL CALCIUM) 500 MG chewable tablet Chew 1 tablet by mouth daily.   Yes [provider]  ceFAZolin (ANCEF) IVPB Inject 2 g into the vein every 8 (eight) hours. Indication:  Staphylococcal lugdunensis bacteremis/endocarditis Last Day of Therapy:  Dec 13, 2017 Labs - Once weekly:  CBC/D and BMP, Labs - Every other week:  ESR and CRP 11/03/17 12/14/17 Yes Arrien, Jimmy Picket, MD  Denosumab (XGEVA [place]) Inject into the skin every 30 (thirty) days.    Yes [provider]  docusate sodium (STOOL SOFTENER) 100 MG capsule Take 200 mg by mouth 2 (two) times daily.   Yes [provider]    ezetimibe (ZETIA) 10 MG tablet Take 1 tablet (10 mg total) by mouth daily. 08/11/17  Yes Jettie Booze, MD  furosemide (LASIX) 40 MG tablet Take 1 tablet (40 mg total) by mouth daily. 08/11/17  Yes Jettie Booze, MD  Leuprolide Acetate (LUPRON DEPOT IM) Inject 1 each into the muscle every 6 (six) months.    Yes [provider]  losartan (COZAAR) 100 MG tablet Take 1 tablet (100 mg total) by mouth daily. 08/11/17  Yes Jettie Booze, MD  multivitamin [facility]) per tablet Take 1 tablet by mouth daily. ( with B - Complex )   Yes [provider]  Omega-3 Fatty Acids (FISH OIL PO) Take 1 capsule by mouth daily. ( Mega Red )   Yes [provider]  ondansetron (ZOFRAN-ODT) 4 MG disintegrating tablet Take 1 tablet (4 mg total) by mouth every 8 (eight) hours as needed for nausea or vomiting. 11/03/17  Yes Arrien, Jimmy Picket, MD  oxybutynin (DITROPAN) 5 MG tablet Take 5 mg by mouth daily as needed for bladder spasms.  10/15/15  Yes [provider]  Probiotic Product (PROBIOTIC ADVANCED PO) Take 1 capsule by mouth daily.   Yes [provider]  warfarin (COUMADIN) 5 MG tablet TAKE 1 TABLET BY MOUTH AS DIRECTED BY THE COUMADIN CLINIC Patient taking differently: Take 2.5-5 mg by mouth See admin instructions. Take one tablet (29m) on M TH SAT and half tablet (2.556m on SUN T W F or AS DIRECTED BY THE COUMADIN CLINIC 08/15/17  Yes VaJettie BoozeMD  XTANDI 40 MG capsule TAKE 1 CAPSULE (40 MG) BY MOUTH ONCE DAILY Patient taking differently: Take 40 mg by mouth daily.  11/11/17  Yes ShWyatt PortelaMD  docusate sodium (COLACE) 100 MG capsule Take 100 mg by mouth 2 (two) times daily. 09/10/14   [provider]    Family History Family History  Problem Relation Age of Onset  . Heart failure Mother   . Heart attack Father     Social History Social History   Tobacco Use  . Smoking status: Former Smoker    Years: 0.50    Types:  Cigarettes    Last attempt to quit: 02/22/1953    Years since quitting: 64.7  . Smokeless tobacco: Never Used  Substance Use Topics  . Alcohol use: No    Comment: 09/27/2012 "glass of wine or 2/month"  . Drug use: No     Allergies   Pravachol and Zocor [simvastatin - high dose]   Review of Systems Review of Systems  Constitutional: Negative for fever.  Respiratory: Positive for shortness of breath.   Cardiovascular: Negative for chest pain and leg swelling.  All other systems reviewed and are negative.    Physical Exam Updated Vital Signs BP (!) 171/78   Pulse 62   Resp (!) 22   SpO2 98%   Physical Exam  Constitutional: He is oriented to person, place, and time. He appears well-developed and well-nourished. No distress.  HENT:  Head: Normocephalic and atraumatic.  Mouth/Throat: Oropharynx is clear and moist.  Eyes: Pupils are equal, round, and reactive to light. Conjunctivae and EOM are normal.  Neck: Normal range of motion. Neck supple.  Cardiovascular: Normal rate, regular rhythm and normal heart sounds.  Pulmonary/Chest: Effort normal and breath sounds normal. No respiratory distress.  Abdominal: Soft. He exhibits no distension. There is no tenderness.  Musculoskeletal: Normal range of motion. He exhibits no edema or deformity.  Neurological: He is alert and oriented to person, place, and time.  Skin: Skin is warm and dry.  Psychiatric: He has a normal mood and affect.  Nursing note and vitals reviewed.    ED Treatments / Results  Labs (all labs ordered are listed, but only abnormal results are displayed) Labs Reviewed  URINALYSIS, ROUTINE W REFLEX MICROSCOPIC - Abnormal; Notable for the following components:      Result Value   Hgb urine dipstick MODERATE (*)    Ketones, ur 5 (*)    Protein, ur 100 (*)    Bacteria, UA FEW (*)    All other components within normal limits  COMPREHENSIVE METABOLIC PANEL - Abnormal; Notable for the following components:    Chloride 97 (*)    Glucose, Bld 107 (*)    BUN 24 (*)    Calcium 10.9 (*)    Albumin 3.0 (*)    GFR calc non Af Amer 51 (*)    GFR calc Af Amer 59 (*)    All other components within normal limits  CBC WITH DIFFERENTIAL/PLATELET -  Abnormal; Notable for the following components:   MCV 101.2 (*)    RDW 15.9 (*)    All other components within normal limits  BRAIN NATRIURETIC PEPTIDE - Abnormal; Notable for the following components:   B Natriuretic Peptide 897.7 (*)    All other components within normal limits  PROTIME-INR - Abnormal; Notable for the following components:   Prothrombin Time 21.5 (*)    All other components within normal limits  CULTURE, BLOOD (ROUTINE X 2)  CULTURE, BLOOD (ROUTINE X 2)  I-STAT TROPONIN, ED    EKG EKG Interpretation  Date/Time:  Monday November 14 2017 10:57:13 EDT Ventricular Rate:  91 PR Interval:    QRS Duration: 94 QT Interval:  403 QTC Calculation: 410 R Axis:   -46 Text Interpretation:  Atrial fibrillation Left anterior fascicular block LVH with secondary repolarization abnormality Anterior Q waves, possibly due to LVH Confirmed by Dene Gentry [id number]) on 11/14/2017 11:05:54 AM   Radiology Dg Chest Port 1 View  Result Date: 11/14/2017 CLINICAL DATA:  [facility] with [facility] from home with c/o of sob- pt has PICC line in place for treatment of known Staph infection. Pt denies any pain. At home ems noted sats to be in low 90's. Pt placed on 2L [place]. EXAM: PORTABLE CHEST 1 VIEW COMPARISON:  None. FINDINGS: Patient has RIGHT-sided PICC line, tip overlying the level of superior vena cava. The heart is enlarged. There is dense atherosclerotic calcification of the thoracic aorta. Persistent dense opacity at the LEFT lung base obscures the hemidiaphragm and is consistent with atelectasis, consolidation, or scarring. There is faint patchy opacity in the LATERAL RIGHT UPPER lobe, slightly increased compared to prior studies. IMPRESSION: 1.  Chronic opacity in the MEDIAL LEFT lung base. Findings are consistent with atelectasis, scarring, or consolidation. 2. Question developing infiltrate in the RIGHT UPPER lobe. 3. Cardiomegaly. 4.  Aortic atherosclerosis.  (ICD10-I70.0) Electronically Signed   By: Nolon Nations M.D.   On: 11/14/2017 12:07    Procedures Procedures (including critical care time)  Medications Ordered in ED Medications  ceFAZolin (ANCEF) IVPB 2g/100 mL premix (2 g Intravenous New Bag/Given 11/14/17 1536)     Initial Impression / Assessment and Plan / ED Course  I have reviewed the triage vital signs and the nursing notes.  Pertinent labs & imaging results that were available during my care of the patient were reviewed by me and considered in my medical decision making (see chart for details).     MDM  Screen complete  Patient is presenting for evaluation of reported shortness of breath and decreased PO intake. Screening labs are without evidence of acute pathology.  Screening chest x-ray is suggestive of a very possible early infiltrate.    After extensive discussion with the patient's Craig [name], I will ask the hospitalist to evaluate for possible observation admission.     Final Clinical Impressions(s) / ED Diagnoses   Final diagnoses:  Dyspnea, unspecified type    ED Discharge Orders    None       Valarie Merino, MD 11/14/17 [phone]

## 2017-11-14 NOTE — Telephone Encounter (Signed)
Excellent

## 2017-11-14 NOTE — Telephone Encounter (Signed)
° ° °  Patient's spouse returning call to Coumadin clinic. Please call

## 2017-11-14 NOTE — Telephone Encounter (Signed)
Spoke with wife and she stated they are in the ER at this time. Wife to call back with an update and any new meds. Also, refer to Anticoagulation Track.

## 2017-11-14 NOTE — H&P (Signed)
History and Physical    Craig [name]. OJJ:[id number] DOB: [birth year] DOA: 11/14/2017  PCP: Mellody Dance, DO Consultants:  Alen Blew - oncology; Junious Silk - urology; Irish Lack - cardiology Patient coming from:  Home - lives with wife of 63 years; NOK: wife, [phone]   Chief Complaint: SOB, weakness  HPI: Craig [name]. is a 84 y.o. male with medical history significant of CVA; metastatic prostate CA s/p radiation therapy and Lupron injections with chronic indwelling foley; ICH; HTN; HLD; PICC line placement for Staph infection treatment; and afib presenting with SOB.  History was provided by his wife, who reports that he was admitted 9/7 with fever and n/v and diagnosed with staph bacteremia.  Echo did not show endocarditis.  He was treated with Cefazolin via PICC line with 6 weeks of planned therapy.  He was discharged 9/12.  Since then, he has continued to have poor PO intake.  About once a week, his wife thinks he has fluid buildup in his lungs and LE and she gives him an extra dose of Lasix (approved by Dr. Irish Lack).  She gave this extra dose once last week and again yesterday after he appeared to have increased SOB around noon.  He did have good urinary response yesterday, but did not sleep well overnight and appeared mildly SOB today.  She called cardiology hoping that they would prescribe home O2 and instead they suggested an office visit.  She felt that the patient was too weak for her to get him there and so they encouraged her to have him brought to the ER.  He did complain of mild chest tightness, which is new for him, but he also complained of mild abdominal pain.  He has had occasional cough, but this is unchanged from his baseline.  He did have dysphagia associated with his CVA but his wife reports that he compensates by moving food around to different sides of his mouth.    ED Course:  Retired Stage manager, recently admitted for Staph bacteremia, possible endocarditis.   24 hours x decreased PO intake, ?pain, SOB.  Unremarkable evaluation, on antibiotics.  ?observation overnight.  Review of Systems: As per HPI; otherwise review of systems reviewed and negative.   Ambulatory Status:  Assists with transfers but non-ambulatory  Past Medical History:  Diagnosis Date  . Atrial fibrillation ([place])   . Chronic back pain   . GERD (gastroesophageal reflux disease)   . H/O hiatal hernia   . History of epistaxis 07/19/2002  . Hyperlipidemia   . Hypertension   . Hypertensive heart disease   . Hypokalemia   . ICH (intracerebral hemorrhage) ([place]) ~ 1999   after TPA/notes 09/27/2012  . Melanoma ([place])    "top of my head" (09/27/2012)  . Migraines    "migraines without headaches years ago" (09/27/2012)  . Osteoarthritis of both feet   . PAT (paroxysmal atrial tachycardia) ([place])   . Personal history of long-term (current) use of anticoagulants   . Pneumonia    "once; several years ago" (09/27/2012)  . Prostate cancer, primary, with metastasis from prostate to other site [facility])    on Lupron injections per GU  . S/P radiation therapy 02/13/14-02/27/14   SRS 5/5 spine completed 02/27/14  . S/P radiation therapy 05/27/14-05/31/14   right femur 20Gy/3f  . Stroke ([facility] ~ 1999   ischemic / right cerebellar/posterior inferior cerebellar artery / right pos infarct    Past Surgical History:  Procedure Laterality Date  . CATARACT EXTRACTION W/ INTRAOCULAR LENS  IMPLANT, BILATERAL Bilateral ~ 2011  . MELANOMA EXCISION  2010   "pre-melanoma on top of head; did skin graft from left thigh to cover" (09/27/2012)  . NASAL SINUS SURGERY  1998  . SKIN GRAFT Right 2010  . TONSILLECTOMY  ~ 1935    Social History   Socioeconomic History  . Marital status: Married    Spouse name: Craig [name]  . Number of children: 3  . Years of education: MD  . Highest education level: Not on file  Occupational History    Employer: RETIRED  Social Needs  . Financial resource strain: Not on file  . Food  insecurity:    Worry: Not on file    Inability: Not on file  . Transportation needs:    Medical: Not on file    Non-medical: Not on file  Tobacco Use  . Smoking status: Former Smoker    Years: 0.50    Types: Cigarettes    Last attempt to quit: 02/22/1953    Years since quitting: 64.7  . Smokeless tobacco: Never Used  Substance and Sexual Activity  . Alcohol use: No    Comment: 09/27/2012 "glass of wine or 2/month"  . Drug use: No  . Sexual activity: Never  Lifestyle  . Physical activity:    Days per week: Not on file    Minutes per session: Not on file  . Stress: Not on file  Relationships  . Social connections:    Talks on phone: Not on file    Gets together: Not on file    Attends religious service: Not on file    Active member of club or organization: Not on file    Attends meetings of clubs or organizations: Not on file    Relationship status: Not on file  . Intimate partner violence:    Fear of current or ex partner: Not on file    Emotionally abused: Not on file    Physically abused: Not on file    Forced sexual activity: Not on file  Other Topics Concern  . Not on file  Social History Narrative   Patient lives at home with spouse.   Caffeine Use: 1/4 cup daily    Allergies  Allergen Reactions  . Pravachol Other (See Comments)    Muscle weakness  . Zocor [Simvastatin - High Dose] Other (See Comments)    Muscle weakness    Family History  Problem Relation Age of Onset  . Heart failure Mother   . Heart attack Father     Prior to Admission medications   Medication Sig Start Date End Date Taking? Authorizing Provider  acetaminophen (TYLENOL) 500 MG tablet Take 1,000 mg by mouth daily.    Yes [provider]  amLODipine (NORVASC) 10 MG tablet Take 1 tablet (10 mg total) by mouth daily. 08/11/17  Yes Jettie Booze, MD  atorvastatin (LIPITOR) 10 MG tablet Take 1 tablet (10 mg total) by mouth daily. 08/11/17  Yes Jettie Booze, MD  B  Complex-C (B-COMPLEX WITH VITAMIN C) tablet Take 1 tablet by mouth daily.     Yes [provider]  calcium carbonate (TUMS - DOSED IN MG ELEMENTAL CALCIUM) 500 MG chewable tablet Chew 1 tablet by mouth daily.   Yes [provider]  ceFAZolin (ANCEF) IVPB Inject 2 g into the vein every 8 (eight) hours. Indication:  Staphylococcal lugdunensis bacteremis/endocarditis Last Day of Therapy:  Dec 13, 2017 Labs - Once weekly:  CBC/D and BMP, Labs - Every  other week:  ESR and CRP 11/03/17 12/14/17 Yes Arrien, Jimmy Picket, MD  Denosumab (XGEVA [place]) Inject into the skin every 30 (thirty) days.    Yes [provider]  docusate sodium (STOOL SOFTENER) 100 MG capsule Take 200 mg by mouth 2 (two) times daily.   Yes [provider]  ezetimibe (ZETIA) 10 MG tablet Take 1 tablet (10 mg total) by mouth daily. 08/11/17  Yes Jettie Booze, MD  furosemide (LASIX) 40 MG tablet Take 1 tablet (40 mg total) by mouth daily. 08/11/17  Yes Jettie Booze, MD  Leuprolide Acetate (LUPRON DEPOT IM) Inject 1 each into the muscle every 6 (six) months.    Yes [provider]  losartan (COZAAR) 100 MG tablet Take 1 tablet (100 mg total) by mouth daily. 08/11/17  Yes Jettie Booze, MD  multivitamin [facility]) per tablet Take 1 tablet by mouth daily. ( with B - Complex )   Yes [provider]  Omega-3 Fatty Acids (FISH OIL PO) Take 1 capsule by mouth daily. ( Mega Red )   Yes [provider]  ondansetron (ZOFRAN-ODT) 4 MG disintegrating tablet Take 1 tablet (4 mg total) by mouth every 8 (eight) hours as needed for nausea or vomiting. 11/03/17  Yes Arrien, Jimmy Picket, MD  oxybutynin (DITROPAN) 5 MG tablet Take 5 mg by mouth daily as needed for bladder spasms.  10/15/15  Yes [provider]  Probiotic Product (PROBIOTIC ADVANCED PO) Take 1 capsule by mouth daily.   Yes [provider]  warfarin (COUMADIN) 5 MG tablet TAKE 1 TABLET BY  MOUTH AS DIRECTED BY THE COUMADIN CLINIC Patient taking differently: Take 2.5-5 mg by mouth See admin instructions. Take one tablet (55m) on M TH SAT and half tablet (2.514m on SUN T W F or AS DIRECTED BY THE COUMADIN CLINIC 08/15/17  Yes VaJettie BoozeMD  XTANDI 40 MG capsule TAKE 1 CAPSULE (40 MG) BY MOUTH ONCE DAILY Patient taking differently: Take 40 mg by mouth daily.  11/11/17  Yes ShWyatt PortelaMD  docusate sodium (COLACE) 100 MG capsule Take 100 mg by mouth 2 (two) times daily. 09/10/14   [provider]    Physical Exam: Vitals:   11/14/17 1315 11/14/17 1400 11/14/17 1500 11/14/17 1700  BP: (!) 175/81 (!) 177/88 (!) 171/78 (!) 130/52  Pulse: 78 (!) 45 62 60  Resp: (!) 25 17 (!) 22   TempSrc:      SpO2: 100% 99% 98% 100%     General:  Appears calm and comfortable and is NAD; he has a chronic R facial droop and mild dysarthria but is quite pleasant and conversant Eyes:  PERRL, EOMI, normal lids, iris ENT:  hard hearing, normal lips & tongue, mmm; appropriate dentition Neck:  no LAD, masses or thyromegaly; no carotid bruits Cardiovascular:  Irregularly irregular, rate controlled;, no m/r/g. No LE edema.  Respiratory:   CTA bilaterally with no wheezes/rales/rhonchi.  Normal respiratory effort. Abdomen:  soft, NT, ND, NABS Skin:  no rash or induration seen on limited exam Musculoskeletal:  good ROM, no bony abnormality Psychiatric:  grossly normal mood and affect, speech mildly dysarthric, AOx3 and very clear that he would desire full code Neurologic:  CN 2-12 grossly intact, moves all extremities in coordinated fashion, sensation intact    Radiological Exams on Admission: Dg Chest Port 1 View  Result Date: 11/14/2017 CLINICAL DATA:  Pt arrives from home with [facility] from home with c/o of sob- pt  has PICC line in place for treatment of known Staph infection. Pt denies any pain. At home ems noted sats to be in low 90's. Pt placed on 2L [place]. EXAM: PORTABLE CHEST 1  VIEW COMPARISON:  None. FINDINGS: Patient has RIGHT-sided PICC line, tip overlying the level of superior vena cava. The heart is enlarged. There is dense atherosclerotic calcification of the thoracic aorta. Persistent dense opacity at the LEFT lung base obscures the hemidiaphragm and is consistent with atelectasis, consolidation, or scarring. There is faint patchy opacity in the LATERAL RIGHT UPPER lobe, slightly increased compared to prior studies. IMPRESSION: 1. Chronic opacity in the MEDIAL LEFT lung base. Findings are consistent with atelectasis, scarring, or consolidation. 2. Question developing infiltrate in the RIGHT UPPER lobe. 3. Cardiomegaly. 4.  Aortic atherosclerosis.  (ICD10-I70.0) Electronically Signed   By: Nolon Nations M.D.   On: 11/14/2017 12:07    EKG: Independently reviewed.  Afib with rate 91; LVH; LAFB;  no evidence of acute ischemia   Labs on Admission: I have personally reviewed the available labs and imaging studies at the time of the admission.  Pertinent labs:   Glucose 107 BUN 24/Creatinine 1.21/GFR 51 - stable Calcium 10.9 Albumin 3.0 BNP 897.7 - none prior (only pre-BNP) Troponin 0.00 Essentially normal CBC UA: mod Hgb, 5 ketones, 100 protein, few bacteria Blood cultures pending   Assessment/Plan Principal Problem:   Acute on chronic diastolic CHF (congestive heart failure) (HCC) Active Problems:   Chronic atrial fibrillation (HCC)   Hypercholesterolemia   CKD (chronic kidney disease) stage 3, GFR 30-59 ml/min (HCC)   Hypercalcemia   Essential hypertension   History of stroke   Bacteremia, coagulase-negative staphylococcal   Acute on chronic diastolic CHF -Patient with recent echo with normal systolic function but immeasurable diastolic function presenting with worsening SOB and mild hypoxia despite -CXR raised concern for PNA, but the patient is on IV antibiotics and this seems less likely unless aspiration is the cause (which wife reports does not  appear to have happened recently - will keep NPO for now and order swallow evaluation by ST) -Normal WBC count, no fever; will not give alternative antibiotics at this time -Elevated BNP -Suspect mild volume overload as the source of his symptoms -Will place in observation status with telemetry -There is no indication for repeat Echo at this time -Will start ASA -Will continue ACE -Start low-dose Coreg -CHF order set utilized -Was not given Lasix in ER; will start 60 mg IV BID for now -Continue [place] O2 for now.  Consider home O2 if patient will qualify, but he is non-ambulatory. -Normal kidney function at this time, will follow -Repeat EKG in AM  Afib -Rate controlled despite no apparent medications - order low-dose Coreg -Continue Coumadin per home dosing  Recent bacteremia, Staph Legdunensis -6 weeks of cefazolin, through 12/13/17 -Has PICC line in RUE, appears clean and well kept -Repeat blood cultures today are pending  CKD -Appears to be stable at this time -Will follow  HLD -Continue Lipitor  HTN -Continue Norvasc, Cozaar -Add Coreg  H/o CVA -Although PNA is lower suspicion, swallow dysfunction would not be unexpected given his h/o CVA -Will keep NPO for now (his wife was feeding him when I entered the room and he did not have apparent cough or sputtering) while awaiting speech therapy consult -It is possible that his generalized weakness and anorexia are resulting from progressive disease; palliative care consultation could be considered - particularly in light of patient's insistence that he  would to be full code.   DVT prophylaxis: Coumadin Code Status:  Full - confirmed with patient/family Family Communication: Wife present throughout evaluation  Disposition Plan:  Home once clinically improved Consults called: None  Admission status: It is my clinical opinion that referral for OBSERVATION is reasonable and necessary in this patient based on the above information  provided. The aforementioned taken together are felt to place the patient at high risk for further clinical deterioration. However it is anticipated that the patient may be medically stable for discharge from the hospital within 24 to 48 hours.    Karmen Bongo MD [facility]  If note is complete, please contact covering daytime or nighttime physician. [email or web address] Password [facility]  11/14/2017, 5:05 PM

## 2017-11-14 NOTE — ED Notes (Signed)
Family at bedside. 

## 2017-11-14 NOTE — ED Triage Notes (Signed)
Pt [facility] with [facility] from home with c/o of sob- pt has PICC line in place for treatment of known Staph infection. Pt denies any pain. At home ems noted sats to be in low 90's. Pt placed on 2L [place]. Pt is nonambulatory at home with wife.Marland Kitchen

## 2017-11-15 DIAGNOSIS — R0602 Shortness of breath: Secondary | ICD-10-CM

## 2017-11-15 DIAGNOSIS — I480 Paroxysmal atrial fibrillation: Secondary | ICD-10-CM | POA: Diagnosis not present

## 2017-11-15 DIAGNOSIS — Z888 Allergy status to other drugs, medicaments and biological substances status: Secondary | ICD-10-CM

## 2017-11-15 DIAGNOSIS — B957 Other staphylococcus as the cause of diseases classified elsewhere: Secondary | ICD-10-CM | POA: Diagnosis not present

## 2017-11-15 DIAGNOSIS — Z452 Encounter for adjustment and management of vascular access device: Secondary | ICD-10-CM | POA: Diagnosis not present

## 2017-11-15 DIAGNOSIS — R7881 Bacteremia: Secondary | ICD-10-CM | POA: Diagnosis not present

## 2017-11-15 DIAGNOSIS — R011 Cardiac murmur, unspecified: Secondary | ICD-10-CM

## 2017-11-15 DIAGNOSIS — C799 Secondary malignant neoplasm of unspecified site: Secondary | ICD-10-CM | POA: Diagnosis present

## 2017-11-15 DIAGNOSIS — Z9981 Dependence on supplemental oxygen: Secondary | ICD-10-CM | POA: Diagnosis not present

## 2017-11-15 DIAGNOSIS — N183 Chronic kidney disease, stage 3 (moderate): Secondary | ICD-10-CM | POA: Diagnosis not present

## 2017-11-15 DIAGNOSIS — I5033 Acute on chronic diastolic (congestive) heart failure: Secondary | ICD-10-CM

## 2017-11-15 DIAGNOSIS — Y92230 Patient room in hospital as the place of occurrence of the external cause: Secondary | ICD-10-CM | POA: Diagnosis not present

## 2017-11-15 DIAGNOSIS — R0902 Hypoxemia: Secondary | ICD-10-CM | POA: Diagnosis not present

## 2017-11-15 DIAGNOSIS — J984 Other disorders of lung: Secondary | ICD-10-CM | POA: Diagnosis present

## 2017-11-15 DIAGNOSIS — J690 Pneumonitis due to inhalation of food and vomit: Secondary | ICD-10-CM

## 2017-11-15 DIAGNOSIS — I472 Ventricular tachycardia: Secondary | ICD-10-CM

## 2017-11-15 DIAGNOSIS — R627 Adult failure to thrive: Secondary | ICD-10-CM | POA: Diagnosis present

## 2017-11-15 DIAGNOSIS — I482 Chronic atrial fibrillation, unspecified: Secondary | ICD-10-CM | POA: Diagnosis not present

## 2017-11-15 DIAGNOSIS — G459 Transient cerebral ischemic attack, unspecified: Secondary | ICD-10-CM | POA: Diagnosis not present

## 2017-11-15 DIAGNOSIS — I082 Rheumatic disorders of both aortic and tricuspid valves: Secondary | ICD-10-CM | POA: Diagnosis present

## 2017-11-15 DIAGNOSIS — I130 Hypertensive heart and chronic kidney disease with heart failure and stage 1 through stage 4 chronic kidney disease, or unspecified chronic kidney disease: Secondary | ICD-10-CM | POA: Diagnosis not present

## 2017-11-15 DIAGNOSIS — R402363 Coma scale, best motor response, obeys commands, at hospital admission: Secondary | ICD-10-CM | POA: Diagnosis present

## 2017-11-15 DIAGNOSIS — J9601 Acute respiratory failure with hypoxia: Secondary | ICD-10-CM

## 2017-11-15 DIAGNOSIS — R402143 Coma scale, eyes open, spontaneous, at hospital admission: Secondary | ICD-10-CM | POA: Diagnosis present

## 2017-11-15 DIAGNOSIS — Z87891 Personal history of nicotine dependence: Secondary | ICD-10-CM

## 2017-11-15 DIAGNOSIS — K5900 Constipation, unspecified: Secondary | ICD-10-CM | POA: Diagnosis not present

## 2017-11-15 DIAGNOSIS — I10 Essential (primary) hypertension: Secondary | ICD-10-CM

## 2017-11-15 DIAGNOSIS — R402253 Coma scale, best verbal response, oriented, at hospital admission: Secondary | ICD-10-CM | POA: Diagnosis present

## 2017-11-15 DIAGNOSIS — R110 Nausea: Secondary | ICD-10-CM | POA: Diagnosis not present

## 2017-11-15 DIAGNOSIS — Z7901 Long term (current) use of anticoagulants: Secondary | ICD-10-CM | POA: Diagnosis not present

## 2017-11-15 DIAGNOSIS — I272 Pulmonary hypertension, unspecified: Secondary | ICD-10-CM | POA: Diagnosis present

## 2017-11-15 DIAGNOSIS — I4729 Other ventricular tachycardia: Secondary | ICD-10-CM

## 2017-11-15 DIAGNOSIS — R1310 Dysphagia, unspecified: Secondary | ICD-10-CM | POA: Diagnosis present

## 2017-11-15 DIAGNOSIS — Z8673 Personal history of transient ischemic attack (TIA), and cerebral infarction without residual deficits: Secondary | ICD-10-CM

## 2017-11-15 DIAGNOSIS — R0600 Dyspnea, unspecified: Secondary | ICD-10-CM | POA: Diagnosis present

## 2017-11-15 DIAGNOSIS — M2550 Pain in unspecified joint: Secondary | ICD-10-CM | POA: Diagnosis not present

## 2017-11-15 DIAGNOSIS — E785 Hyperlipidemia, unspecified: Secondary | ICD-10-CM | POA: Diagnosis not present

## 2017-11-15 DIAGNOSIS — C61 Malignant neoplasm of prostate: Secondary | ICD-10-CM | POA: Diagnosis present

## 2017-11-15 DIAGNOSIS — E7800 Pure hypercholesterolemia, unspecified: Secondary | ICD-10-CM | POA: Diagnosis not present

## 2017-11-15 DIAGNOSIS — R1110 Vomiting, unspecified: Secondary | ICD-10-CM | POA: Diagnosis not present

## 2017-11-15 DIAGNOSIS — I444 Left anterior fascicular block: Secondary | ICD-10-CM | POA: Diagnosis present

## 2017-11-15 DIAGNOSIS — Z7401 Bed confinement status: Secondary | ICD-10-CM | POA: Diagnosis not present

## 2017-11-15 DIAGNOSIS — J189 Pneumonia, unspecified organism: Secondary | ICD-10-CM | POA: Diagnosis not present

## 2017-11-15 DIAGNOSIS — Z466 Encounter for fitting and adjustment of urinary device: Secondary | ICD-10-CM | POA: Diagnosis not present

## 2017-11-15 DIAGNOSIS — Z95828 Presence of other vascular implants and grafts: Secondary | ICD-10-CM

## 2017-11-15 DIAGNOSIS — Z8249 Family history of ischemic heart disease and other diseases of the circulatory system: Secondary | ICD-10-CM

## 2017-11-15 LAB — BASIC METABOLIC PANEL
ANION GAP: 10 (ref 5–15)
BUN: 22 mg/dL (ref 8–23)
CO2: 31 mmol/L (ref 22–32)
Calcium: 10.5 mg/dL — ABNORMAL HIGH (ref 8.9–10.3)
Chloride: 99 mmol/L (ref 98–111)
Creatinine, Ser: 1.14 mg/dL (ref 0.61–1.24)
GFR calc Af Amer: >60 mL/min (ref >60)
GFR calc non Af Amer: 55 mL/min — ABNORMAL LOW (ref >60)
GLUCOSE: 115 mg/dL — AB (ref 70–99)
POTASSIUM: 3.7 mmol/L (ref 3.5–5.1)
Sodium: 140 mmol/L (ref 135–145)

## 2017-11-15 LAB — MAGNESIUM: MAGNESIUM: 1.9 mg/dL (ref 1.7–2.4)

## 2017-11-15 LAB — CBC WITH DIFFERENTIAL/PLATELET
Abs Immature Granulocytes: 0 10*3/uL (ref 0.0–0.1)
BASOS PCT: 1 %
Basophils Absolute: 0.1 10*3/uL (ref 0.0–0.1)
EOS ABS: 0.2 10*3/uL (ref 0.0–0.7)
EOS PCT: 2 %
HEMATOCRIT: 40 % (ref 39.0–52.0)
Hemoglobin: 12.3 g/dL — ABNORMAL LOW (ref 13.0–17.0)
IMMATURE GRANULOCYTES: 0 %
LYMPHS ABS: 1.2 10*3/uL (ref 0.7–4.0)
Lymphocytes Relative: 13 %
MCH: 31.1 pg (ref 26.0–34.0)
MCHC: 30.8 g/dL (ref 30.0–36.0)
MCV: 101.3 fL — AB (ref 78.0–100.0)
MONO ABS: 0.9 10*3/uL (ref 0.1–1.0)
MONOS PCT: 9 %
Neutro Abs: 7.4 10*3/uL (ref 1.7–7.7)
Neutrophils Relative %: 75 %
Platelets: 212 10*3/uL (ref 150–400)
RBC: 3.95 MIL/uL — ABNORMAL LOW (ref 4.22–5.81)
RDW: 15.9 % — ABNORMAL HIGH (ref 11.5–15.5)
WBC: 9.7 10*3/uL (ref 4.0–10.5)

## 2017-11-15 LAB — PROTIME-INR
INR: 1.72
Prothrombin Time: 20 seconds — ABNORMAL HIGH (ref 11.4–15.2)

## 2017-11-15 MED ORDER — WARFARIN - PHARMACIST DOSING INPATIENT
Freq: Every day | Status: DC
  Administered 2017-11-15: 18:00:00
  Administered 2017-11-16: 1
  Administered 2017-11-17: 18:00:00

## 2017-11-15 MED ORDER — WARFARIN SODIUM 5 MG PO TABS
5.0000 mg | ORAL_TABLET | Freq: Once | ORAL | Status: AC
  Administered 2017-11-15: 5 mg via ORAL
  Filled 2017-11-15: qty 1

## 2017-11-15 MED ORDER — ENZALUTAMIDE 40 MG PO CAPS
40.0000 mg | ORAL_CAPSULE | Freq: Every day | ORAL | Status: DC
  Filled 2017-11-15: qty 1

## 2017-11-15 MED ORDER — POTASSIUM CHLORIDE CRYS ER 20 MEQ PO TBCR
40.0000 meq | EXTENDED_RELEASE_TABLET | Freq: Once | ORAL | Status: AC
  Administered 2017-11-15: 40 meq via ORAL
  Filled 2017-11-15: qty 2

## 2017-11-15 MED ORDER — ENZALUTAMIDE 40 MG PO CAPS
40.0000 mg | ORAL_CAPSULE | Freq: Every day | ORAL | Status: DC
  Administered 2017-11-15 – 2017-11-17 (×3): 40 mg via ORAL
  Filled 2017-11-15 (×3): qty 1

## 2017-11-15 MED ORDER — SODIUM CHLORIDE 0.9% FLUSH: 10.0000 mL | INTRAVENOUS | Status: DC | PRN

## 2017-11-15 NOTE — Evaluation (Signed)
Clinical/Bedside Swallow Evaluation Patient Details  Name: Craig [name]. MRN: [id number] Date of Birth: January 20, 1925  Today's Date: 11/15/2017 Time: SLP Start Time (ACUTE ONLY): 9476 SLP Stop Time (ACUTE ONLY): 0848 SLP Time Calculation (min) (ACUTE ONLY): 20 min  Past Medical History:  Past Medical History:  Diagnosis Date  . Atrial fibrillation ([place])   . Chronic back pain   . GERD (gastroesophageal reflux disease)   . H/O hiatal hernia   . History of epistaxis 07/19/2002  . Hyperlipidemia   . Hypertension   . Hypertensive heart disease   . Hypokalemia   . ICH (intracerebral hemorrhage) ([place]) ~ 1999   after TPA/notes 09/27/2012  . Melanoma ([place])    "top of my head" (09/27/2012)  . Migraines    "migraines without headaches years ago" (09/27/2012)  . Osteoarthritis of both feet   . PAT (paroxysmal atrial tachycardia) ([place])   . Personal history of long-term (current) use of anticoagulants   . Pneumonia    "once; several years ago" (09/27/2012)  . Prostate cancer, primary, with metastasis from prostate to other site [facility])    on Lupron injections per GU  . S/P radiation therapy 02/13/14-02/27/14   SRS 5/5 spine completed 02/27/14  . S/P radiation therapy 05/27/14-05/31/14   right femur 20Gy/65fx  . Stroke [facility]) ~ 1999   ischemic / right cerebellar/posterior inferior cerebellar artery / right pos infarct   Past Surgical History:  Past Surgical History:  Procedure Laterality Date  . CATARACT EXTRACTION W/ INTRAOCULAR LENS  IMPLANT, BILATERAL Bilateral ~ 2011  . MELANOMA EXCISION  2010   "pre-melanoma on top of head; did skin graft from left thigh to cover" (09/27/2012)  . NASAL SINUS SURGERY  1998  . SKIN GRAFT Right 2010  . TONSILLECTOMY  ~ 1935   HPI:  Craig [name]. is a 84 y.o. [name] with medical history significant of CVA; dysphagia related to CVA; GERD; PNA; metastatic prostate CA s/p radiation therapy; ICH; HTN; HLD; PICC line placement for Staph infection treatment; and  afib. Per chart, he presented to ED yesterday (9/23) with SOB. Pt was also hospitalized earlier this month and d/c 9/12, since then he has had poor PO intake per wife's report. CXR revealed  questionable development of infliltrate in right upper lobe, chronic opacity medial left lung base, cardiomegaly and aortic atherosclerosis.    Assessment / Plan / Recommendation Clinical Impression  Pt was alert and cooperative throughout BSE. Wife at bedside reported pt's history of dysphagia is marked primarily by prolonged mastication and compensation of right side facial weakness by manipulating food on stronger side of oral cavity. Pt and wife demonstrated astute awareness of pt's deficits as well as symptoms of aspiration and its correlation to/risks of pneumonia. Oral motor exam revealed right sided facial weakness and decreased right side facial ROM (consistent with wife report and 2002 CVA), although lingual strength and ROM appeared in tact. Pt was unable to complete 3oz water test with consecutive sips and mild anterior loss noted, however no overt s/s aspiration observed with thin, puree, or regular texture PO's. Recommend regular diet, thin liquids, straws allowed, and encourage use of small/slow bites and sips in minimally distracting environment. No further ST necessary.   SLP Visit Diagnosis: Dysphagia, unspecified (R13.10)    Aspiration Risk  Mild aspiration risk    Diet Recommendation Regular;Thin liquid   Liquid Administration via: Cup;Straw Medication Administration: Whole meds with liquid Supervision: Staff to assist with self feeding Compensations: Minimize environmental distractions;Slow  rate;Small sips/bites Postural Changes: Seated upright at 90 degrees    Other  Recommendations Oral Care Recommendations: Oral care BID   Follow up Recommendations None      Frequency and Duration min 1 x/week  2 weeks       Prognosis        Swallow Study   General HPI: Craig Lewers. is a 84 y.o. [name] with medical history significant of CVA; dysphagia related to CVA; GERD; PNA; metastatic prostate CA s/p radiation therapy; ICH; HTN; HLD; PICC line placement for Staph infection treatment; and afib. Per chart, he presented to ED yesterday (9/23) with SOB. Pt was also hospitalized earlier this month and d/c 9/12, since then he has had poor PO intake per wife's report. CXR revealed  questionable development of infliltrate in right upper lobe, chronic opacity medial left lung base, cardiomegaly and aortic atherosclerosis.  Type of Study: Bedside Swallow Evaluation Diet Prior to this Study: NPO Temperature Spikes Noted: No Respiratory Status: Nasal cannula History of Recent Intubation: No Behavior/Cognition: Alert;Cooperative;Pleasant mood;Requires cueing Oral Cavity Assessment: Within Functional Limits Oral Care Completed by SLP: No Oral Cavity - Dentition: Adequate natural dentition Vision: Functional for self-feeding Self-Feeding Abilities: Able to feed self;Needs assist Patient Positioning: Upright in bed Baseline Vocal Quality: Normal Volitional Cough: Weak Volitional Swallow: Able to elicit    Oral/Motor/Sensory Function Overall Oral Motor/Sensory Function: Moderate impairment Facial ROM: Reduced right Facial Symmetry: Abnormal symmetry right Facial Strength: Reduced right Lingual ROM: Within Functional Limits Lingual Symmetry: Within Functional Limits Lingual Strength: Within Functional Limits Velum: Within Functional Limits Mandible: Within Functional Limits   Ice Chips Ice chips: Not tested   Thin Liquid Thin Liquid: Within functional limits Presentation: Cup;Straw    Nectar Thick Nectar Thick Liquid: Not tested   Honey Thick Honey Thick Liquid: Not tested   Puree Puree: Within functional limits Presentation: Spoon;Self Fed   Solid    Jettie Booze, Student SLP  Solid: Within functional limits Presentation: [place] 11/15/2017,11:06  AM

## 2017-11-15 NOTE — Progress Notes (Signed)
SATURATION QUALIFICATIONS: (This note is used to comply with regulatory documentation for home oxygen)  Patient Saturations on Room Air at Rest = varies between 96 to 88%.   Pt will stay at 88-90% for a few seconds then will returned back to normal limits.    Patient Saturations on 1L O2 = 96%- 94%  Patient is non-ambulatory

## 2017-11-15 NOTE — Consult Note (Signed)
[facility]    Date of Admission:  11/14/2017          Reason for Consult: Pneunmonia / Bacteremia   Referring Provider: Algis [name] Primary Care Provider: Mellody Dance, DO   Assessment/Plan:  [name] is a 84 y/o male recently admitted to the hospital with Staphylococcus Lugdunensis bacteremia being treated with Ancef for 6 weeks with end date of 10/22 who was discharged on 9/12 and now readmitted with increasing shortness of breath. He continues to receive his Cefazolin through his PICC line which appears without evidence of infection. There is no evidence of pneumonia through history or physical exam. SLP evaluation with mild aspiration risk. He has been afebrile at home and here in the hospital. It appears likely the shortness of breath is an exacerbation of his known heart failure. Blood cultures were drawn and there is no growth to date currently.  1. Continue current dose of Cefazolin for Staphylococcus Lugdunensis bacteremia with end date of 10/22. 2. Recommend cardiology evaluation for heart failure exacerbation as needed. 3. Plan for office follow up with Dr. Tommy [name] in [place] office as planned.    Principal Problem:   Acute on chronic diastolic CHF (congestive heart failure) (HCC) Active Problems:   Chronic atrial fibrillation (HCC)   Hypercholesterolemia   CKD (chronic kidney disease) stage 3, GFR 30-59 ml/min (HCC)   Hypercalcemia   Essential hypertension   History of stroke   Bacteremia, coagulase-negative staphylococcal   . amLODipine  10 mg Oral Daily  . aspirin EC  81 mg Oral Daily  . atorvastatin  10 mg Oral Daily  . carvedilol  3.125 mg Oral BID WC  . ezetimibe  10 mg Oral Daily  . furosemide  60 mg Intravenous BID  . losartan  100 mg Oral Daily  . sodium chloride flush  3 mL Intravenous Q12H  . warfarin  2.5 mg Oral Once per day on Sun Tue Wed Fri  . warfarin  5 mg Oral Once per day on Mon Thu Sat  . Warfarin - Physician  Dosing Inpatient   Does not apply q1800     HPI: Craig [name]. is a 84 y.o. male with a previous medical history outlined below with relevant history of recent hospitalization with Staph Lugdunensis bactermia on Ancef who was admitted on 9/23 with the chief complaint of shortness of breath. He was discharged from the hospital on 11/03/17. Family noted a decreased oral intake in the 24 hours prior to presentation and increasing shortness of breath. Blood work in the ED with elevated BNP. EKG with atrial fibrillation and left anterior fascicular block. Chest x-ray with chronic opacity of the medial left lung base; questionable devloping infiltrate on the right upper lobe. He was continued on Ancef as pneumonia was deemed unlikely. There was concern for possible aspiration. He is scheduled to complete his Ancef on 12/13/17.  [name] with mild aspiration risk with the recommendation for a regular diet and thin liquids. He has remained afebrile since admission with a WBC count of 9.7. Blood cultures are without growth in 24 hours.  He began experiencing shortness of breath about 4 days ago which was refractory to an extra dose of furosemide. Also having some upper chest pain/discomfort which has since resolved. Wife denies any recent fevers, chills, or coughs.    Review of Systems: Review of Systems  Constitutional: Negative for chills and fever.  Respiratory: Positive for  shortness of breath. Negative for cough, hemoptysis, sputum production and wheezing.   Cardiovascular: Negative for chest pain.  Gastrointestinal: Negative for abdominal pain, constipation, diarrhea, nausea and vomiting.  Genitourinary: Negative for dysuria, flank pain, frequency and urgency.  Skin: Negative for rash.     Past Medical History:  Diagnosis Date  . Atrial fibrillation ([place])   . Chronic back pain   . GERD (gastroesophageal reflux disease)   . H/O hiatal hernia   . History of  epistaxis 07/19/2002  . Hyperlipidemia   . Hypertension   . Hypertensive heart disease   . Hypokalemia   . ICH (intracerebral hemorrhage) ([place]) ~ 1999   after TPA/notes 09/27/2012  . Melanoma ([place])    "top of my head" (09/27/2012)  . Migraines    "migraines without headaches years ago" (09/27/2012)  . Osteoarthritis of both feet   . PAT (paroxysmal atrial tachycardia) ([place])   . Personal history of long-term (current) use of anticoagulants   . Pneumonia    "once; several years ago" (09/27/2012)  . Prostate cancer, primary, with metastasis from prostate to other site [facility])    on Lupron injections per GU  . S/P radiation therapy 02/13/14-02/27/14   SRS 5/5 spine completed 02/27/14  . S/P radiation therapy 05/27/14-05/31/14   right femur 20Gy/41fx  . Stroke [facility]) ~ 1999   ischemic / right cerebellar/posterior inferior cerebellar artery / right pos infarct    Social History   Tobacco Use  . Smoking status: Former Smoker    Years: 0.50    Types: Cigarettes    Last attempt to quit: 02/22/1953    Years since quitting: 64.7  . Smokeless tobacco: Never Used  Substance Use Topics  . Alcohol use: No    Comment: 09/27/2012 "glass of wine or 2/month"  . Drug use: No    Family History  Problem Relation Age of Onset  . Heart failure Mother   . Heart attack Father     Allergies  Allergen Reactions  . Pravachol Other (See Comments)    Muscle weakness  . Zocor [Simvastatin - High Dose] Other (See Comments)    Muscle weakness    OBJECTIVE: Blood pressure (!) 163/74, pulse (!) 56, temperature 98.2 F (36.8 C), temperature source Oral, resp. rate 20, height 6' (1.829 m), weight 85.2 kg, SpO2 99 %.  Physical Exam  Constitutional: He appears well-developed. No distress.  Elderly gentleman lying in bed; lethargic, but easily arousable. Speech slurred.   Cardiovascular: Normal rate and intact distal pulses. An irregularly irregular rhythm present.  Murmur heard. PICC line appears clean and without  evidence of infection.   Pulmonary/Chest: Effort normal and breath sounds normal.  Neurological:  Arousable to voice.  Skin: Skin is warm and dry.  Psychiatric: He has a normal mood and affect.    Lab Results Lab Results  Component Value Date   WBC 9.7 11/15/2017   HGB 12.3 (L) 11/15/2017   HCT 40.0 11/15/2017   MCV 101.3 (H) 11/15/2017   PLT 212 11/15/2017    Lab Results  Component Value Date   CREATININE 1.14 11/15/2017   BUN 22 11/15/2017   NA 140 11/15/2017   K 3.7 11/15/2017   CL 99 11/15/2017   CO2 31 11/15/2017    Lab Results  Component Value Date   ALT <5 11/14/2017   AST 18 11/14/2017   ALKPHOS 62 11/14/2017   BILITOT 0.8 11/14/2017     Microbiology: Recent Results (from the past 240 hour(s))  Culture,  blood (routine x 2)     Status: None (Preliminary result)   Collection Time: 11/14/17 11:40 AM  Result Value Ref Range Status   Specimen Description BLOOD RIGHT HAND  Final   Special Requests   Final    BOTTLES DRAWN AEROBIC AND ANAEROBIC Blood Culture adequate volume   Culture   Final    NO GROWTH < 24 HOURS Performed at [facility], [address] [place], [place] [address]    Report Status PENDING  Incomplete  Culture, blood (routine x 2)     Status: None (Preliminary result)   Collection Time: 11/14/17 11:53 AM  Result Value Ref Range Status   Specimen Description BLOOD LEFT HAND  Final   Special Requests   Final    BOTTLES DRAWN AEROBIC AND ANAEROBIC Blood Culture adequate volume   Culture   Final    NO GROWTH < 24 HOURS Performed at [facility], [place] [address] [place], [place] [address]    Report Status PENDING  Incomplete     Terri Piedra, NP [facility] [phone] Pager  11/15/2017  11:29 AM

## 2017-11-15 NOTE — Care Management Note (Signed)
Case Management Note  Patient Details  Name: Craig [name]. MRN: [id number] Date of Birth: 08/17/1924  Subjective/Objective:    Acute resp, CHF,  Asp PNA               Action/Plan: Spoke to pt and wife at bedside. Wife wants pt to have oxygen at home. Contacted [facility] to make aware of admission. Pt on IV abx with [facility] at home. Will need new HH RN/PT orders with F2F if ReDs Vest protocol is added. And OPAT orders for IV abx. Will need oxygen order and wife will pay for out of pocket if he does not qualify. Cost will be $280 for initial start up and $250-$280 out of pocket. Will need PTAR transport to home. Pt has hospital bed at home.  Per unit RN note, pt qualifying for oxygen.   Expected Discharge Date:                Expected Discharge Plan:  [place]  In-House Referral:  NA  Discharge planning Services  CM Consult  Post Acute Care Choice:  Home Health, Resumption of Svcs/PTA Provider Choice offered to:  Spouse  DME Arranged:    DME Agency:     HH Arranged:  RN, IV Antibiotics HH Agency:  [facility]  Status of Service:  In process, will continue to follow  If discussed at Long Length of Stay Meetings, dates discussed:    Additional Comments:  Erenest Rasher, RN 11/15/2017, 4:10 PM

## 2017-11-15 NOTE — Progress Notes (Signed)
Pt had 19 beats of V tach. Asymptomatic. MD notified. Will hand over care to the incoming RN.

## 2017-11-15 NOTE — Consult Note (Addendum)
Cardiology Consultation:   Patient ID: Craig [name].; [id number]; 1924-04-29   Admit date: 11/14/2017 Date of Consult: 11/15/2017  Primary Care Provider: Mellody Dance, DO Primary Cardiologist: Larae Grooms, MD Primary Electrophysiologist:  None   Patient Profile:   Craig [name]. is a 84 y.o. male with a hx of HTN, HLD, chronic AF on coumadin, PE 2016, CKD III, CVA, prostate CA w/ mets s/p XRT now on Xtandi, EF 35-40% by echo 2016, on losartan and Lasix, aortic atherosclerosis, chronic indwelling Foley, who is being seen today for the evaluation of CHF and Afib at the request of Dr Algis Liming.  History of Present Illness:   Craig [name] was hospitalized 09/07-09/01/2018 with staph bacteremia, TEE ok, Cefazolin via PICC till 10/22.  Pt has been having problems w/ volume, wife periodically giving him extra Lasix. Several phone notes 09/23. Over the last week, SOB has worsened. HR low at times 44 is listed. CP was also reported.   In the ER, dx w/ acute on chronic diastolic CHF, acute hypoxic resp failure, PNA possible, aspiration a concern but he passed a swallow eval, WBC ok and no fever>>no change to ABX. PICC line w/out s&s infection.   Lasix started at 60 mg IV BID, still requires O2.  Seen by ID, no med changes.  RN reported NSVT, no sx, Coreg already added.   Per wife, SOB is the indicator for extra volume. He is not able to stand and weigh. She is willing to measure his thighs to see if that will help determine early volume overload.   She does not add salt to foods, but admits she has not looked closely at all the labels.  She does not feel he drinks over 2 L daily.  Feels his dry weight is about 190-195 lbs. Current weight 187.   She tries to get the calories in him. He always has room for dessert! She gives him nutritional supplements to increase protein intake. However, po intake is generally poor.   Over the last week, po intake has been trending down  and SOB has been increasing. He has not been thriving. He has been getting weaker. He is normally able to help move himself, but that has gotten worse.   She feels he would benefit from home O2.  If he does not qualify for it at discharge, she is interested in getting it self-pay.  She has given him extra Lasix twice in the past week.  She wonders how much Lasix he can have.  Home dose is only 40 mg a day.   Past Medical History:  Diagnosis Date  . Atrial fibrillation ([place])   . Chronic back pain   . GERD (gastroesophageal reflux disease)   . H/O hiatal hernia   . History of epistaxis 07/19/2002  . Hyperlipidemia   . Hypertension   . Hypertensive heart disease   . Hypokalemia   . ICH (intracerebral hemorrhage) ([place]) ~ 1999   after TPA/notes 09/27/2012  . Melanoma ([place])    "top of my head" (09/27/2012)  . Migraines    "migraines without headaches years ago" (09/27/2012)  . Osteoarthritis of both feet   . PAT (paroxysmal atrial tachycardia) ([place])   . Personal history of long-term (current) use of anticoagulants   . Pneumonia    "once; several years ago" (09/27/2012)  . Prostate cancer, primary, with metastasis from prostate to other site [facility])    on Lupron injections per GU  . S/P radiation therapy 02/13/14-02/27/14  SRS 5/5 spine completed 02/27/14  . S/P radiation therapy 05/27/14-05/31/14   right femur 20Gy/57f  . Stroke ([facility] ~ 1999   ischemic / right cerebellar/posterior inferior cerebellar artery / right pos infarct    Past Surgical History:  Procedure Laterality Date  . CATARACT EXTRACTION W/ INTRAOCULAR LENS  IMPLANT, BILATERAL Bilateral ~ 2011  . MELANOMA EXCISION  2010   "pre-melanoma on top of head; did skin graft from left thigh to cover" (09/27/2012)  . NASAL SINUS SURGERY  1998  . SKIN GRAFT Right 2010  . TONSILLECTOMY  ~ 1935     Prior to Admission medications   Medication Sig Start Date End Date Taking? Authorizing Provider  acetaminophen (TYLENOL) 500 MG tablet Take  1,000 mg by mouth daily.    Yes [provider]  amLODipine (NORVASC) 10 MG tablet Take 1 tablet (10 mg total) by mouth daily. 08/11/17  Yes VJettie Booze MD  atorvastatin (LIPITOR) 10 MG tablet Take 1 tablet (10 mg total) by mouth daily. 08/11/17  Yes VJettie Booze MD  B Complex-C (B-COMPLEX WITH VITAMIN C) tablet Take 1 tablet by mouth daily.     Yes [provider]  calcium carbonate (TUMS - DOSED IN MG ELEMENTAL CALCIUM) 500 MG chewable tablet Chew 1 tablet by mouth daily.   Yes [provider]  ceFAZolin (ANCEF) IVPB Inject 2 g into the vein every 8 (eight) hours. Indication:  Staphylococcal lugdunensis bacteremis/endocarditis Last Day of Therapy:  Dec 13, 2017 Labs - Once weekly:  CBC/D and BMP, Labs - Every other week:  ESR and CRP 11/03/17 12/14/17 Yes Arrien, MJimmy Picket MD  Denosumab (XGEVA [place]) Inject into the skin every 30 (thirty) days.    Yes [provider]  docusate sodium (STOOL SOFTENER) 100 MG capsule Take 200 mg by mouth 2 (two) times daily.   Yes [provider]  ezetimibe (ZETIA) 10 MG tablet Take 1 tablet (10 mg total) by mouth daily. 08/11/17  Yes VJettie Booze MD  furosemide (LASIX) 40 MG tablet Take 1 tablet (40 mg total) by mouth daily. 08/11/17  Yes VJettie Booze MD  Leuprolide Acetate (LUPRON DEPOT IM) Inject 1 each into the muscle every 6 (six) months.    Yes [provider]  losartan (COZAAR) 100 MG tablet Take 1 tablet (100 mg total) by mouth daily. 08/11/17  Yes VJettie Booze MD  multivitamin ([facility] per tablet Take 1 tablet by mouth daily. ( with B - Complex )   Yes [provider]  Omega-3 Fatty Acids (FISH OIL PO) Take 1 capsule by mouth daily. ( Mega Red )   Yes [provider]  ondansetron (ZOFRAN-ODT) 4 MG disintegrating tablet Take 1 tablet (4 mg total) by mouth every 8 (eight) hours as needed for nausea or vomiting. 11/03/17  Yes Arrien, MJimmy Picket MD  oxybutynin (DITROPAN) 5 MG tablet Take 5 mg by mouth daily as needed for bladder spasms.  10/15/15  Yes [provider]  Probiotic Product (PROBIOTIC ADVANCED PO) Take 1 capsule by mouth daily.   Yes [provider]  warfarin (COUMADIN) 5 MG tablet TAKE 1 TABLET BY MOUTH AS DIRECTED BY THE COUMADIN CLINIC Patient taking differently: Take 2.5-5 mg by mouth See admin instructions. Take one tablet (519m on M TH SAT and half tablet (2.46m70mon SUN T W F or AS DIRECTED BY THE COUMADIN CLINIC 08/15/17  Yes VarJettie BoozeD  XTANDI 40 MG capsule TAKE 1 CAPSULE (  40 MG) BY MOUTH ONCE DAILY Patient taking differently: Take 40 mg by mouth daily.  11/11/17  Yes Wyatt Portela, MD    Inpatient Medications: Scheduled Meds: . amLODipine  10 mg Oral Daily  . aspirin EC  81 mg Oral Daily  . atorvastatin  10 mg Oral Daily  . carvedilol  3.125 mg Oral BID WC  . enzalutamide  40 mg Oral Daily  . ezetimibe  10 mg Oral Daily  . furosemide  60 mg Intravenous BID  . losartan  100 mg Oral Daily  . sodium chloride flush  3 mL Intravenous Q12H  . warfarin  5 mg Oral ONCE-1800  . Warfarin - Pharmacist Dosing Inpatient   Does not apply q1800   Continuous Infusions: . sodium chloride    .  ceFAZolin (ANCEF) IV 2 g (11/15/17 1408)   PRN Meds: sodium chloride, acetaminophen, ondansetron (ZOFRAN) IV, oxybutynin, sodium chloride flush, sodium chloride flush  Allergies:    Allergies  Allergen Reactions  . Pravachol Other (See Comments)    Muscle weakness  . Zocor [Simvastatin - High Dose] Other (See Comments)    Muscle weakness   Social History:   Social History   Socioeconomic History  . Marital status: Married    Spouse name: [name]  . Number of children: 3  . Years of education: MD  . Highest education level: Not on file  Occupational History    Employer: RETIRED  Social Needs  . Financial resource strain: Not on file  . Food insecurity:    Worry: Not on file     Inability: Not on file  . Transportation needs:    Medical: Not on file    Non-medical: Not on file  Tobacco Use  . Smoking status: Former Smoker    Years: 0.50    Types: Cigarettes    Last attempt to quit: 02/22/1953    Years since quitting: 64.7  . Smokeless tobacco: Never Used  Substance and Sexual Activity  . Alcohol use: No    Comment: 09/27/2012 "glass of wine or 2/month"  . Drug use: No  . Sexual activity: Never  Lifestyle  . Physical activity:    Days per week: Not on file    Minutes per session: Not on file  . Stress: Not on file  Relationships  . Social connections:    Talks on phone: Not on file    Gets together: Not on file    Attends religious service: Not on file    Active member of club or organization: Not on file    Attends meetings of clubs or organizations: Not on file    Relationship status: Not on file  . Intimate partner violence:    Fear of current or ex partner: Not on file    Emotionally abused: Not on file    Physically abused: Not on file    Forced sexual activity: Not on file  Other Topics Concern  . Not on file  Social History Narrative   Patient lives at home with spouse.   Caffeine Use: 1/4 cup daily    Family History:   Family History  Problem Relation Age of Onset  . Heart failure Mother   . Heart attack Father    Family Status:  Family Status  Relation Name Status  . Mother  Deceased at age 62       CHF  . Father  Deceased at age 36       MI  .  Brother  Deceased       at birth  . MGF  Deceased  . MGM  Deceased  . PGM  Deceased  . PGF  Deceased    ROS:  Please see the history of present illness.  All other ROS reviewed and negative.     Physical Exam/Data:   Vitals:   11/15/17 0413 11/15/17 0819 11/15/17 1248 11/15/17 1445  BP: (!) 146/94 (!) 163/74 (!) 163/64 (!) 147/55  Pulse: 64 (!) 56 (!) 46 (!) 57  Resp: _0 Temp: 97.7 F (36.5 C) 98.2 F (36.8 C) 97.7 F (36.5 C) 97.7 F (36.5 C)  TempSrc: Oral  Oral Oral Oral  SpO2: 98% 99% 94% 97%  Weight: 85.2 kg     Height:        Intake/Output Summary (Last 24 hours) at 11/15/2017 1502 Last data filed at 11/15/2017 0900 Gross per 24 hour  Intake 194.07 ml  Output 1800 ml  Net -1605.93 ml   Filed Weights   11/14/17 2033 11/15/17 0413  Weight: 87.2 kg 85.2 kg   Body mass index is 25.47 kg/m.  General:  Well nourished, well developed, in no acute distress HEENT: normal Lymph: no adenopathy Neck: Minimal JVD Endocrine:  No thryomegaly Vascular: No carotid bruits; 4/4 extremity pulses 2+, without bruits  Cardiac:  normal S1, S2; regular rate and rhythm; 2/6 murmur  Lungs: Rales bases bilaterally, no wheezing, rhonchi Abd: soft, nontender, no hepatomegaly  Ext: no edema Musculoskeletal:  No deformities, BUE and BLE extremely weak but equal Skin: warm and dry  Neuro:  CNs 2-12 intact, no new abnormalities noted Psych:  Normal affect   EKG:  The EKG was personally reviewed and demonstrates: 9/24, atrial fibrillation, PVC noted, heart rate 84, QT/QTc 408/488, longer than previous.  However, by my measurement, QT is 390 and QTc is 422 Telemetry:  Telemetry was personally reviewed and demonstrates: Atrial fibrillation, rate generally controlled, some short runs of nonsustained VT  Relevant CV Studies:  ECHO: 11/02/2017 - Left ventricle: The cavity size was normal. Wall thickness was   increased in a pattern of mild LVH. The estimated ejection   fraction was 55%. Basal inferior hypokinesis. The study was not   technically sufficient to allow evaluation of LV diastolic   dysfunction due to atrial fibrillation. - Aortic valve: Trileaflet; moderately calcified leaflets. There   was mild to moderate stenosis (mild by mean gradient, moderate   visually and by calculated valve area). There was mild to   moderate regurgitation. Mean gradient (S): 16 mm Hg. Valve area   (VTI): 1.1 cm^2. - Aorta: Ascending aortic diameter: 38 mm (S). -  Ascending aorta: The ascending aorta was mildly dilated. - Mitral valve: Mildly calcified annulus. There was mild   regurgitation. - Left atrium: The atrium was severely dilated. - Right ventricle: The cavity size was normal. Systolic function   was normal. - Right atrium: The atrium was severely dilated. - Tricuspid valve: There was moderate regurgitation. Peak RV-RA   gradient (S): 78 mm Hg. - Pulmonary arteries: PA peak pressure: 86 mm Hg (S). - Systemic veins: IVC measured 1.7 cm with < 50% respirophasic   variation, suggesting RA pressure 8 mmHg. - Pericardium, extracardiac: A trivial pericardial effusion was   identified.  Impressions:  - The patient was in atrial fibrillation. Normal LV size with mild   LV hypertrophy. EF 55% with inferior hypokinesis. Normal RV size   and systolic function. Severe biatrial enlargement.  Moderate TR   with severe pulmonary hypertension. Mild to moderate aortic   stenosis (mild by mean gradient, moderate visually and by   calculated valve area).   Laboratory Data:  Chemistry Recent Labs  Lab 11/14/17 1141 11/15/17 0337  NA 141 140  K 3.8 3.7  CL 97* 99  CO2 31 31  GLUCOSE 107* 115*  BUN 24* 22  CREATININE 1.21 1.14  CALCIUM 10.9* 10.5*  GFRNONAA 51* 55*  GFRAA 59* >60  ANIONGAP 13 10    Lab Results  Component Value Date   ALT <5 11/14/2017   AST 18 11/14/2017   ALKPHOS 62 11/14/2017   BILITOT 0.8 11/14/2017   Hematology Recent Labs  Lab 11/14/17 1141 11/15/17 0337  WBC 7.8 9.7  RBC 4.32 3.95*  HGB 13.5 12.3*  HCT 43.7 40.0  MCV 101.2* 101.3*  MCH 31.3 31.1  MCHC 30.9 30.8  RDW 15.9* 15.9*  PLT 199 212   Cardiac EnzymesNo results for input(s): TROPONINI in the last 168 hours.  Recent Labs  Lab 11/14/17 1204  TROPIPOC 0.00    BNP Recent Labs  Lab 11/14/17 1141  BNP 897.7*    TSH:  Lab Results  Component Value Date   TSH 1.341 09/27/2012   Lipids: Lab Results  Component Value Date   CHOL 153  08/11/2017   HDL 50 08/11/2017   LDLCALC 77 08/11/2017   TRIG 128 08/11/2017   CHOLHDL 3.1 08/11/2017   HgbA1c: Lab Results  Component Value Date   HGBA1C 5.8 (H) 01/01/2015   Magnesium:  Magnesium  Date Value Ref Range Status  11/15/2017 1.9 1.7 - 2.4 mg/dL Final    Comment:    Performed at [facility], [place] [address] [place], [place] [address]     Radiology/Studies:  Dg Chest Port 1 View  Result Date: 11/14/2017 CLINICAL DATA:  Pt arrives from home with [facility] from home with c/o of sob- pt has PICC line in place for treatment of known Staph infection. Pt denies any pain. At home ems noted sats to be in low 90's. Pt placed on 2L [place]. EXAM: PORTABLE CHEST 1 VIEW COMPARISON:  None. FINDINGS: Patient has RIGHT-sided PICC line, tip overlying the level of superior vena cava. The heart is enlarged. There is dense atherosclerotic calcification of the thoracic aorta. Persistent dense opacity at the LEFT lung base obscures the hemidiaphragm and is consistent with atelectasis, consolidation, or scarring. There is faint patchy opacity in the LATERAL RIGHT UPPER lobe, slightly increased compared to prior studies. IMPRESSION: 1. Chronic opacity in the MEDIAL LEFT lung base. Findings are consistent with atelectasis, scarring, or consolidation. 2. Question developing infiltrate in the RIGHT UPPER lobe. 3. Cardiomegaly. 4.  Aortic atherosclerosis.  (ICD10-I70.0) Electronically Signed   By: Nolon Nations M.D.   On: 11/14/2017 12:07    Assessment and Plan:   Principal Problem: 1.  Acute on chronic diastolic CHF (congestive heart failure) (HCC) -Discussed sodium and fluid restrictions with wife, she understands and will be more vigilant. - Discussed ways to detect early volume overload without him getting so extremely weak and short of breath. - The case manager will talk to them about the "[employer]" program.  This may be ideal for him.  If this is not possible, consider things such as manual  measurements of his thighs to look for extra volume or obtaining a pulse ox to determine when his oxygen saturation starts to drop. -She is interested in obtaining home oxygen, even  if she has to pay for it herself, case manager aware and will discuss with her. -It seems that Lasix 40 mg daily is not enough for her.  Discuss with MD dosing changes to either 40 mg twice daily every other day or every day to see if we can do a better job of maintaining volume without over stressing his renal function. -His general health seems to be deteriorating, per other notes in the chart, he wishes to be a full code.  It may be appropriate for Dr. Irish Lack to discuss this with he and his wife.  2.  NSVT: - Carvedilol is new, he is tolerating it well - Will supplement potassium to get it closer to 4, mag is 1.9  Otherwise, per IM/ID Active Problems:   Chronic atrial fibrillation (HCC)   Hypercholesterolemia   CKD (chronic kidney disease) stage 3, GFR 30-59 ml/min (HCC)   Hypercalcemia   Essential hypertension   History of stroke   Bacteremia, coagulase-negative staphylococcal   Aspiration pneumonia (HCC)   Acute respiratory failure with hypoxia (HCC)   NSVT (nonsustained ventricular tachycardia) ([place])     For questions or updates, please contact [facility] Please consult [email or web address] for contact info under Cardiology/STEMI.   Signed, Rosaria Ferries, PA-C  11/15/2017 3:02 PM   I have personally seen and examined this patient with Rosaria Ferries, PA-C. I agree with the assessment and plan as outlined above. Craig [name] is a pleasant 84 yo retired [employer] with many chronic medical issues. He had a recent admission with sepsis. LV systolic function was normal by echo 2 weeks ago. No severe valve disease.  Now admitted with dyspnea and fatigue which is his classic presentation for volume overload. He has diuresed well with IV Lasix. He and his wife believe that he still has volume overload. He  has no LE edema, abdominal distention. There is mild JVD.  Labs reviewed.  EKG reviewed by me and shows atrial fibrillation with PVCs My exam: Elderly male who appears comfortable but with dyspnea upon speaking. Mild JVD. No LE edema. Abdomen is soft and non-distended. EL:FYBOF irreg with systolic murmur. Pulm: decreased BS bases but no loud crackles.   Plan: Acute on chronic diastolic CHF: Based on his symptoms, he has continued mild volume overload. Will continue IV Lasix today and consider changing to Lasix 40 mg po BID tomorrow. He would like supplemental oxygen to use at home for comfort.   End of Life Issues: I have had a long discussion today with the patient and his wife regarding end of life issues. He has reached a new baseline which is clearly not at the functional level that he desires. He understands this and the fact that his current symptoms are due to his many comorbidities. He will have a discussion with his family tonight regarding his code status. I have advised him to consider a DNR status as I am sure he would not return to a reasonable functional state if he were to be successfully resuscitated in the event of a cardiac or respiratory arrest.   Lauree Chandler 11/15/2017 4:35 PM

## 2017-11-15 NOTE — Progress Notes (Signed)
Pt's BP 176/84. NP on call was notified for an IV intervention as pt is NPO awaiting SLP consult. Awaiting orders. Will continue to monitor.

## 2017-11-15 NOTE — Progress Notes (Addendum)
PROGRESS NOTE   Craig [name].  VHQ:[id number]    DOB: [birth year]    DOA: 11/14/2017  PCP: Mellody Dance, DO   I have briefly reviewed patients previous medical records in [facility].  Outpatient consultants: Dr. Alen Blew (Oncology), Dr. Junious Silk (Urology), Dr. Irish Lack (Cardiology) & ID (Dr. Lucianne Lei dam)  Brief Narrative:  84 year old married male, retired [employer], PMH of HTN, HLD, PAF, stage III chronic kidney disease, chronic indwelling Foley catheter, chronic diastolic CHF, CVA, nonambulatory for couple of years, metastatic prostate cancer status post radiation therapy and Lupron injections, recent hospitalization 10/29/2017-11/03/2017 for sepsis due to Staphylococcus Lugdunensis bacteremia, CAUTI, TEE without vegetations, ID consulted and recommended IV cefazolin for 6 weeks via PICC line to empirically treat for bacterial endocarditis (end date 12/13/2017) presented to [facility] ED on 11/14/2017 due to ongoing poor oral intake since recent hospital discharge, recurrent fluid buildup treated at home with extra doses of Lasix, but dyspnea got progressively worse, spouse called primary cardiologist office hoping that they would prescribe home oxygen but was advised to come to office for revisit, patient profoundly weak and hence presented to ED.  Additionally reported mild chest tightness, mild abdominal pain, occasional dry cough which is at baseline, chronic dysphagia but compensates with slow chewing or swallowing.  EMS noted oxygen saturations in the low 90s and placed on oxygen.  Admitted for acute hypoxic respiratory failure secondary to acute on chronic diastolic CHF,?  Aspiration pneumonia.  Cardiology and ID were consulted at spouse's request.   Assessment & Plan:   Principal Problem:   Acute on chronic diastolic CHF (congestive heart failure) (HCC) Active Problems:   Chronic atrial fibrillation (HCC)   Hypercholesterolemia   CKD (chronic kidney disease) stage 3, GFR 30-59 ml/min  (HCC)   Hypercalcemia   Essential hypertension   History of stroke   Bacteremia, coagulase-negative staphylococcal   Acute hypoxic respiratory failure: EMS found oxygen saturations to be in the low 90s.  Likely due to decompensated CHF, possible aspiration pneumonia and severe pulmonary hypertension.  Treat underlying conditions as below and wean off oxygen as tolerated.  Spouse aware that he will meet criteria for oxygen only if oxygen saturations are 88% or less on room air.  Acute on chronic diastolic CHF: TTE 06/05/2438: LVEF 55%, basal inferior hypokinesis, diastolic dysfunction could not be evaluated, mild to moderate aortic stenosis, moderate TR with severe pulmonary hypertension.  Has been getting extra doses of oral Lasix at home with some improvement followed by worsening.  Started Lasix 60 mg IV twice daily, losartan continued, aspirin and carvedilol added on admission.  -1.6 L since admission.  Slowly improving.  Spouse insists on cardiology consultation for heart failure evaluation and "new murmur".  Cardiology consulted.  ?  Aspiration pneumonia (RUL): Patient at high risk for aspiration due to chronic dysphagia from history of CVA.  Was NPO overnight of admission until speech therapy evaluated 9/24 and regular diet recommended.  Blood cultures x2: Negative to date.  Currently on IV cefazolin for recent staph bacteremia.  In the absence of significant symptoms i.e. productive cough, fevers or leukocytosis, probably can continue IV Ancef alone without broadening antibiotics.  However consulted ID for input.  Follow-up chest x-ray in a.m.  Chronic dysphagia: As per ST recommendations, regular diet and thin liquids.  Chronic A. fib: Controlled ventricular rate.  Continue carvedilol 3.125 mg twice daily and Coumadin per pharmacy.  INR subtherapeutic at 1.72.  Defer to Cardiology regarding need to bridge with Lovenox or heparin  until INR therapeutic.  Nonsustained wide-complex tachycardia:?   NSVT versus A. fib with aberrancy.  Continue telemetry.  Carvedilol 3.125 mg twice daily initiated this admission.  Replace potassium to >4.  Magnesium 1.9.  Severe pulmonary hypertension/moderate TR: Diuresis as above.  Mild to moderate aortic stenosis: Nonsurgical candidate.  Recent Staphylococcus Lugdunensis bacteremia: Has RUE PICC line and is supposed to finish 6 weeks of cefazolin through 12/13/2017.  ID alerted of admission.  Essential hypertension: Mildly uncontrolled.  Continue amlodipine 10 mg daily, carvedilol 3.125 mg twice daily, losartan 100 mg daily and monitor.  Hyperlipidemia: Continue atorvastatin and Zetia..  Stage III chronic kidney disease: Creatinine normal and at baseline.  Chronic indwelling Foley catheter: Catheter was changed earlier this month during recent hospitalization.  Continue oxybutynin.  Outpatient follow-up with urology.  History of CVA: Coumadin anticoagulation as discussed above.  Aspirin initiated this admission,?  Need to continue.  Metastatic prostate cancer: Continue home dose of Xtandi.  Adult failure to thrive: Multifactorial secondary to very advanced age, multiple severe significant comorbidities.  If patient does not quickly improve, may consider palliative care consultation to address goals.  Patient spouse is extremely involved in his care.  Chronic opacity medial left lung base: Atelectasis versus scarring versus consolidation.  Nonambulatory:   DVT prophylaxis: Coumadin per pharmacy. Code Status: Full. Family Communication: Discussed in detail with patient's spouse at bedside.  Updated care and answered questions. Disposition: DC home pending medical improvement.   Consultants:  Cardiology. Infectious disease.  Procedures:  None.  Has RUE PICC line and chronic indwelling Foley catheter from PTA.  Antimicrobials:  IV cefazolin from PTA.   Subjective: Patient interviewed and examined along with spouse and patient's RN at  bedside.  Patient reports feeling better.  Dyspnea improved.  Spouse was unhappy because patient was NPO overnight but seems satisfied now that speech therapy has recommended diet.  Profound generalized weakness.  ROS: As above, otherwise negative.  Objective:  Vitals:   11/15/17 0000 11/15/17 0234 11/15/17 0413 11/15/17 0819  BP: (!) 176/84 (!) 161/79 (!) 146/94 (!) 163/74  Pulse: 73 72 64 (!) 56  Resp: 18  14 20   Temp: 98.1 F (36.7 C)  97.7 F (36.5 C) 98.2 F (36.8 C)  TempSrc: Oral  Oral Oral  SpO2: 99%  98% 99%  Weight:   85.2 kg   Height:        Examination:  General exam: Pleasant elderly male, moderately built and nourished, frail and chronically ill looking lying comfortably propped up in bed. Respiratory system: Diminished breath sounds in the bases with occasional basal crackles.  Rest of lung fields clear to auscultation.  Poor inspiratory effort and low tone voice. Respiratory effort normal. Cardiovascular system: S1 & S2 heard, irregularly irregular. No JVD, rubs, gallops or clicks.  Grade 3 x 6 pansystolic murmur best heard at the apex.  Trace bilateral ankle edema.  Telemetry personally reviewed: A. fib with controlled ventricular rate, low voltage.  4 and 10 beat nonsustained wide-complex tachycardia noted,?  NSVT versus aberrancy. Gastrointestinal system: Abdomen is nondistended, soft and nontender. No organomegaly or masses felt. Normal bowel sounds heard. Central nervous system: Alert and oriented. No focal neurological deficits.  Follows instructions appropriately. Extremities: Symmetric 5 x 5 power in upper extremities and at least grade 3 x 5 in lower extremities. Skin: No rashes, lesions or ulcers Psychiatry: Judgement and insight appear normal. Mood & affect appropriate.     Data Reviewed: I have personally reviewed following labs and  imaging studies  CBC: Recent Labs  Lab 11/14/17 1141 11/15/17 0337  WBC 7.8 9.7  NEUTROABS 5.6 7.4  HGB 13.5 12.3*    HCT 43.7 40.0  MCV 101.2* 101.3*  PLT 199 782   Basic Metabolic Panel: Recent Labs  Lab 11/14/17 1141 11/15/17 0337  NA 141 140  K 3.8 3.7  CL 97* 99  CO2 31 31  GLUCOSE 107* 115*  BUN 24* 22  CREATININE 1.21 1.14  CALCIUM 10.9* 10.5*  MG  --  1.9   Liver Function Tests: Recent Labs  Lab 11/14/17 1141  AST 18  ALT <5  ALKPHOS 62  BILITOT 0.8  PROT 7.1  ALBUMIN 3.0*   Coagulation Profile: Recent Labs  Lab 11/14/17 11/14/17 1141 11/15/17 0337  INR 2.1 1.89 1.72     Recent Results (from the past 240 hour(s))  Culture, blood (routine x 2)     Status: None (Preliminary result)   Collection Time: 11/14/17 11:40 AM  Result Value Ref Range Status   Specimen Description BLOOD RIGHT HAND  Final   Special Requests   Final    BOTTLES DRAWN AEROBIC AND ANAEROBIC Blood Culture adequate volume   Culture   Final    NO GROWTH < 24 HOURS Performed at [facility], [place] [address], [place], [place] [address]    Report Status PENDING  Incomplete  Culture, blood (routine x 2)     Status: None (Preliminary result)   Collection Time: 11/14/17 11:53 AM  Result Value Ref Range Status   Specimen Description BLOOD LEFT HAND  Final   Special Requests   Final    BOTTLES DRAWN AEROBIC AND ANAEROBIC Blood Culture adequate volume   Culture   Final    NO GROWTH < 24 HOURS Performed at [facility], [place] [address], [place], [place] [address]    Report Status PENDING  Incomplete         Radiology Studies: Dg Chest Port 1 View  Result Date: 11/14/2017 CLINICAL DATA:  Pt arrives from home with [facility] from home with c/o of sob- pt has PICC line in place for treatment of known Staph infection. Pt denies any pain. At home ems noted sats to be in low 90's. Pt placed on 2L . EXAM: PORTABLE CHEST 1 VIEW COMPARISON:  None. FINDINGS: Patient has RIGHT-sided PICC line, tip overlying the level of superior vena cava. The heart is enlarged. There is dense atherosclerotic  calcification of the thoracic aorta. Persistent dense opacity at the LEFT lung base obscures the hemidiaphragm and is consistent with atelectasis, consolidation, or scarring. There is faint patchy opacity in the LATERAL RIGHT UPPER lobe, slightly increased compared to prior studies. IMPRESSION: 1. Chronic opacity in the MEDIAL LEFT lung base. Findings are consistent with atelectasis, scarring, or consolidation. 2. Question developing infiltrate in the RIGHT UPPER lobe. 3. Cardiomegaly. 4.  Aortic atherosclerosis.  (ICD10-I70.0) Electronically Signed   By: Nolon Nations M.D.   On: 11/14/2017 12:07        Scheduled Meds: . amLODipine  10 mg Oral Daily  . aspirin EC  81 mg Oral Daily  . atorvastatin  10 mg Oral Daily  . carvedilol  3.125 mg Oral BID WC  . ezetimibe  10 mg Oral Daily  . furosemide  60 mg Intravenous BID  . losartan  100 mg Oral Daily  . sodium chloride flush  3 mL Intravenous Q12H  . warfarin  2.5 mg Oral Once per day on  Sun Tue Wed Fri  . warfarin  5 mg Oral Once per day on Mon Thu Sat  . Warfarin - Physician Dosing Inpatient   Does not apply q1800   Continuous Infusions: . sodium chloride    .  ceFAZolin (ANCEF) IV 2 g (11/15/17 0702)     LOS: 0 days     Vernell Leep, MD, FACP, [facility]. [facility] Pager [phone]  If 7PM-7AM, please contact night-coverage [email or web address] Password [facility] 11/15/2017, 11:56 AM

## 2017-11-15 NOTE — Progress Notes (Signed)
ANTICOAGULATION CONSULT NOTE - Initial Consult  Pharmacy Consult for Warfarin Indication: atrial fibrillation  Allergies  Allergen Reactions  . Pravachol Other (See Comments)    Muscle weakness  . Zocor [Simvastatin - High Dose] Other (See Comments)    Muscle weakness    Patient Measurements: Height: 6' (182.9 cm) Weight: 187 lb 13.3 oz (85.2 kg) IBW/kg (Calculated) : 77.6  Vital Signs: Temp: 97.7 F (36.5 C) (09/24 1248) Temp Source: Oral (09/24 1248) BP: 163/64 (09/24 1248) Pulse Rate: 46 (09/24 1248)  Labs: Recent Labs    11/14/17 11/14/17 1141 11/15/17 0337  HGB  --  13.5 12.3*  HCT  --  43.7 40.0  PLT  --  199 212  LABPROT  --  21.5* 20.0*  INR 2.1 1.89 1.72  CREATININE  --  1.21 1.14    Estimated Creatinine Clearance: 47.3 mL/min (by C-G formula based on SCr of 1.14 mg/dL).   Medical History: Past Medical History:  Diagnosis Date  . Atrial fibrillation ([place])   . Chronic back pain   . GERD (gastroesophageal reflux disease)   . H/O hiatal hernia   . History of epistaxis 07/19/2002  . Hyperlipidemia   . Hypertension   . Hypertensive heart disease   . Hypokalemia   . ICH (intracerebral hemorrhage) ([place]) ~ 1999   after TPA/notes 09/27/2012  . Melanoma ([place])    "top of my head" (09/27/2012)  . Migraines    "migraines without headaches years ago" (09/27/2012)  . Osteoarthritis of both feet   . PAT (paroxysmal atrial tachycardia) ([place])   . Personal history of long-term (current) use of anticoagulants   . Pneumonia    "once; several years ago" (09/27/2012)  . Prostate cancer, primary, with metastasis from prostate to other site [facility])    on Lupron injections per GU  . S/P radiation therapy 02/13/14-02/27/14   SRS 5/5 spine completed 02/27/14  . S/P radiation therapy 05/27/14-05/31/14   right femur 20Gy/46fx  . Stroke [facility]) ~ 1999   ischemic / right cerebellar/posterior inferior cerebellar artery / right pos infarct    Assessment: 84 yo male admitted with acute on  chronic diastolic CHF on warfarin chronically for atrial fibrillation. Pharmacy was consulted to restart warfarin therapy.  Home regimen: 5 mg M,Th,Sa and 2.5 mg Sun,T,W,F  INR today subtherapeutic at 1.72  Goal of Therapy:  INR 2-3   Plan:  Will give Warfarin 5 mg po x 1 tonight F/u INR daily   Alanda Slim, PharmD, [facility] Clinical Pharmacist Please see AMION for all Pharmacists' Contact Phone Numbers 11/15/2017, 1:59 PM

## 2017-11-15 NOTE — Progress Notes (Signed)
Paged pharmacist- pt is normally on a Cancer medication- pt wife has it in room.   Needs today's dose.  Would like to speak to pharmacist.   Pharmacist will be by

## 2017-11-16 ENCOUNTER — Inpatient Hospital Stay (HOSPITAL_COMMUNITY): Payer: Medicare Other

## 2017-11-16 DIAGNOSIS — I480 Paroxysmal atrial fibrillation: Secondary | ICD-10-CM | POA: Diagnosis not present

## 2017-11-16 LAB — BASIC METABOLIC PANEL
Anion gap: 8 (ref 5–15)
BUN: 23 mg/dL (ref 8–23)
CHLORIDE: 97 mmol/L — AB (ref 98–111)
CO2: 33 mmol/L — ABNORMAL HIGH (ref 22–32)
CREATININE: 1.19 mg/dL (ref 0.61–1.24)
Calcium: 9.5 mg/dL (ref 8.9–10.3)
GFR calc Af Amer: >60 mL/min (ref >60)
GFR, EST NON AFRICAN AMERICAN: 52 mL/min — AB (ref >60)
GLUCOSE: 96 mg/dL (ref 70–99)
POTASSIUM: 4 mmol/L (ref 3.5–5.1)
Sodium: 138 mmol/L (ref 135–145)

## 2017-11-16 LAB — PROTIME-INR
INR: 1.67
Prothrombin Time: 19.5 seconds — ABNORMAL HIGH (ref 11.4–15.2)

## 2017-11-16 MED ORDER — WARFARIN SODIUM 3 MG PO TABS
3.0000 mg | ORAL_TABLET | Freq: Once | ORAL | Status: AC
  Administered 2017-11-16: 3 mg via ORAL
  Filled 2017-11-16: qty 1

## 2017-11-16 MED ORDER — FUROSEMIDE 40 MG PO TABS
40.0000 mg | ORAL_TABLET | Freq: Two times a day (BID) | ORAL | Status: DC
  Administered 2017-11-17 – 2017-11-18 (×3): 40 mg via ORAL
  Filled 2017-11-16 (×3): qty 1

## 2017-11-16 MED ORDER — B COMPLEX-C PO TABS
1.0000 | ORAL_TABLET | Freq: Every day | ORAL | Status: DC
  Administered 2017-11-16 – 2017-11-18 (×3): 1 via ORAL
  Filled 2017-11-16 (×3): qty 1

## 2017-11-16 MED ORDER — RISAQUAD PO CAPS
ORAL_CAPSULE | Freq: Every day | ORAL | Status: DC
  Administered 2017-11-17: 1 via ORAL
  Administered 2017-11-18: 09:00:00 via ORAL
  Filled 2017-11-16 (×4): qty 1

## 2017-11-16 MED ORDER — DOCUSATE SODIUM 100 MG PO CAPS
200.0000 mg | ORAL_CAPSULE | Freq: Two times a day (BID) | ORAL | Status: DC
  Administered 2017-11-16 – 2017-11-17 (×3): 200 mg via ORAL
  Filled 2017-11-16 (×3): qty 2

## 2017-11-16 MED ORDER — BACITRACIN ZINC 500 UNIT/GM EX OINT
TOPICAL_OINTMENT | Freq: Two times a day (BID) | CUTANEOUS | Status: DC
  Administered 2017-11-16 – 2017-11-18 (×5): via TOPICAL
  Filled 2017-11-16: qty 28.4

## 2017-11-16 MED ORDER — ADULT MULTIVITAMIN W/MINERALS CH
1.0000 | ORAL_TABLET | Freq: Every day | ORAL | Status: DC
  Administered 2017-11-16 – 2017-11-18 (×3): 1 via ORAL
  Filled 2017-11-16 (×5): qty 1

## 2017-11-16 MED ORDER — BISACODYL 5 MG PO TBEC: 5.0000 mg | DELAYED_RELEASE_TABLET | Freq: Every day | ORAL | Status: DC | PRN

## 2017-11-16 MED ORDER — ACETAMINOPHEN 500 MG PO TABS: 1000.0000 mg | ORAL_TABLET | Freq: Every day | ORAL | Status: DC

## 2017-11-16 NOTE — Progress Notes (Addendum)
Progress Note  Patient Name: Craig [name]. Date of Encounter: 11/16/2017  Primary Cardiologist: Larae Grooms, MD  Subjective   LE edema is better. No complaints.   Inpatient Medications    Scheduled Meds: . amLODipine  10 mg Oral Daily  . aspirin EC  81 mg Oral Daily  . atorvastatin  10 mg Oral Daily  . bacitracin   Topical BID  . carvedilol  3.125 mg Oral BID WC  . docusate sodium  200 mg Oral BID  . enzalutamide  40 mg Oral Daily  . ezetimibe  10 mg Oral Daily  . furosemide  60 mg Intravenous BID  . losartan  100 mg Oral Daily  . sodium chloride flush  3 mL Intravenous Q12H  . Warfarin - Pharmacist Dosing Inpatient   Does not apply q1800   Continuous Infusions: . sodium chloride 250 mL (11/15/17 1952)  .  ceFAZolin (ANCEF) IV 2 g (11/16/17 0506)   PRN Meds: sodium chloride, acetaminophen, bisacodyl, ondansetron (ZOFRAN) IV, oxybutynin, sodium chloride flush, sodium chloride flush   Vital Signs    Vitals:   11/16/17 0418 11/16/17 0420 11/16/17 0953 11/16/17 1247  BP: (!) 161/69 (!) 161/69 (!) 156/70 (!) 148/59  Pulse: (!) 49 (!) 52 (!) 135 (!) 46  Resp: 16 18  16   Temp: (!) 97 F (36.1 C) (!) 97 F (36.1 C)  98.1 F (36.7 C)  TempSrc:  Oral  Oral  SpO2: 99% 98% 99% 99%  Weight:  85.6 kg    Height:        Intake/Output Summary (Last 24 hours) at 11/16/2017 1326 Last data filed at 11/16/2017 1000 Gross per 24 hour  Intake 770 ml  Output 500 ml  Net 270 ml   Filed Weights   11/14/17 2033 11/15/17 0413 11/16/17 0420  Weight: 87.2 kg 85.2 kg 85.6 kg    Telemetry    AFib rate well controlled - Personally Reviewed  ECG    N/a - Personally Reviewed  Physical Exam   General: Frail older W male appearing in no acute distress. Head: Normocephalic, atraumatic.  Neck: Supple without bruits, JVD. Lungs:  Resp regular and unlabored, CTA. Heart: Irreg Irreg, S1, S2, systolic murmur; no rub. Abdomen: Soft, non-tender, non-distended with  normoactive bowel sounds. Extremities: No clubbing, cyanosis, 1+ LEedema. Distal pedal pulses are 2+ bilaterally. Neuro: Alert and oriented X 3. Moves all extremities spontaneously. Psych: Normal affect.  Labs    Chemistry Recent Labs  Lab 11/14/17 1141 11/15/17 0337 11/16/17 0259  NA 141 140 138  K 3.8 3.7 4.0  CL 97* 99 97*  CO2 31 31 33*  GLUCOSE 107* 115* 96  BUN 24* 22 23  CREATININE 1.21 1.14 1.19  CALCIUM 10.9* 10.5* 9.5  PROT 7.1  --   --   ALBUMIN 3.0*  --   --   AST 18  --   --   ALT <5  --   --   ALKPHOS 62  --   --   BILITOT 0.8  --   --   GFRNONAA 51* 55* 52*  GFRAA 59* >60 >60  ANIONGAP 13 10 8      Hematology Recent Labs  Lab 11/14/17 1141 11/15/17 0337  WBC 7.8 9.7  RBC 4.32 3.95*  HGB 13.5 12.3*  HCT 43.7 40.0  MCV 101.2* 101.3*  MCH 31.3 31.1  MCHC 30.9 30.8  RDW 15.9* 15.9*  PLT 199 212    Cardiac EnzymesNo results for input(s):  TROPONINI in the last 168 hours.  Recent Labs  Lab 11/14/17 1204  TROPIPOC 0.00     BNP Recent Labs  Lab 11/14/17 1141  BNP 897.7*     DDimer No results for input(s): DDIMER in the last 168 hours.    Radiology    Dg Chest Port 1 View  Result Date: 11/16/2017 CLINICAL DATA:  Aspiration pneumonia EXAM: PORTABLE CHEST 1 VIEW COMPARISON:  November 14, 2017 FINDINGS: The mediastinal contour is stable. Heart size is enlarged. Right central venous line is identified distal tip probably in the subclavian/ jugular vein junction. Patchy opacity of the right mid lung is unchanged. Dense consolidation of the left lung base is unchanged. IMPRESSION: No significant interval change in the patchy consolidation right mid lung and dense consolidation of left lung base compared prior exam. Right central venous line is identified distal tip probably in the subclavian/jugular vein junction. Electronically Signed   By: Abelardo Diesel M.D.   On: 11/16/2017 09:53    Cardiac Studies   N/a   Patient Profile     84 y.o.  male with a hx of HTN, HLD, chronic AF on coumadin, PE 2016, CKD III, CVA, prostate CA w/ mets s/p XRT now on Xtandi, EF 35-40% by echo 2016, on losartan and Lasix, aortic atherosclerosis, chronic indwelling Foley, who was for the evaluation of CHF and Afib.  Assessment & Plan    1. Acute on Diastolic HF: Discussed sodium and fluid restrictions with wife, she understands and will be more vigilant. Has been receiving IV lasix with improvement in LE edema and breathing. Would continue with IV this evening and then transition to 40mg  PO BID in the morning.   2. Chronic AFib: Rate is very well controlled. Actually bradycardiac into the 30s at times. Will stop Coreg. He is very frail and significant decline in his functional status. INR is subtherapeutic but given his frailty seems risk of bridge would outweigh benefit.  -- PharmD to dose coumadin  Signed, Reino Bellis, NP  11/16/2017, 1:26 PM  Pager # [phone]   For questions or updates, please contact [place] Please consult [email or web address] for contact info under Cardiology/STEMI.   Patient seen, examined. Available data reviewed. Agree with findings, assessment, and plan as outlined by Reino Bellis, NP. Pt is elderly in NAD, JVP moderately elevated, lungs CTA< heart irregularly irregular with 3/6 holosystolic murmur at the apex, abdomen soft, NT, extremities with 1+ edema.   Agree with management above. Probably ready to transition to oral diuretic tomorrow. Tele reviewed and shows rate-controlled chronic atrial fibrillation. Not much else to add at this point. Discussed plan with patient and wife. They are hoping for ST-SNF at [facility].   Sherren Mocha, M.D. 11/16/2017 10:44 PM

## 2017-11-16 NOTE — Progress Notes (Signed)
PROGRESS NOTE    Craig [name].  FIE:[id number] DOB: 03-31-1924 DOA: 11/14/2017 PCP: Mellody Dance, DO    Brief Narrative: 84 year old married male, retired Stage manager, PMH of HTN, HLD, PAF, stage III chronic kidney disease, chronic indwelling Foley catheter, chronic diastolic CHF, CVA, nonambulatory for couple of years, metastatic prostate cancer status post radiation therapy and Lupron injections, recent hospitalization 10/29/2017-11/03/2017 for sepsis due to Staphylococcus Lugdunensis bacteremia, CAUTI, TEE without vegetations, ID consulted and recommended IV cefazolin for 6 weeks via PICC line to empirically treat for bacterial endocarditis (end date 12/13/2017) presented to [facility] ED on 11/14/2017 due to ongoing poor oral intake since recent hospital discharge, recurrent fluid buildup treated at home with extra doses of Lasix, but dyspnea got progressively worse, spouse called primary cardiologist office hoping that they would prescribe home oxygen but was advised to come to office for revisit, patient profoundly weak and hence presented to ED.  Additionally reported mild chest tightness, mild abdominal pain, occasional dry cough which is at baseline, chronic dysphagia but compensates with slow chewing or swallowing.  EMS noted oxygen saturations in the low 90s and placed on oxygen.  Admitted for acute hypoxic respiratory failure secondary to acute on chronic diastolic CHF,?  Aspiration pneumonia.  Cardiology and ID were consulted at spouse's request.   Assessment & Plan:   Principal Problem:   Acute on chronic diastolic CHF (congestive heart failure) (HCC) Active Problems:   Chronic atrial fibrillation (HCC)   Hypercholesterolemia   CKD (chronic kidney disease) stage 3, GFR 30-59 ml/min (HCC)   Hypercalcemia   Essential hypertension   History of stroke   Bacteremia, coagulase-negative staphylococcal   Aspiration pneumonia (HCC)   Acute respiratory failure with hypoxia (HCC)   NSVT  (nonsustained ventricular tachycardia) (HCC)   Acute hypoxic respiratory failure.  Related to CHF exacerbation, ? Aspiration PNA.  On IV lasix.  Evaluation for home oxygen.   Acute on chronic Diastolic HF exacerbation;  TTE 11/02/2017: LVEF 55%, basal inferior hypokinesis, diastolic dysfunction could not be evaluated, mild to moderate aortic stenosis, moderate TR with severe pulmonary hypertension. On IV lasix.  Cardiology following.  Weight 192--188.  Aspiration PNA ? Evaluated by speech regular diet.  ID consulted and following.  chest x ray stable right upper infiltrate.  On IV Cefazolin for staph Bacteremia; Follow ID recommendation.   Staph Lugdunensis  Bacteremia;  On IV Cefazolin.  Last dose of cefazolin 10-22  Constipation.  Resume docusate. PRN dulcolax.   Adult failure to thrive;  Multifactorial.    Chronic opacity medial left lung base: Follow up out patient, if family wishes to pursue this.  Chest x ray stable.  Incentive spirometry.   Metastatic prostate cancer: Continue home dose of Xtandi.  Chronic indwelling Foley catheter: Continue oxybutin.   History of CVA; on coumadin.    Stage III chronic kidney disease: Creatinine normal and at baseline. follow    DVT prophylaxis: on coumadin  Code Status: full code.  Family Communication: wife at bedside.  Disposition Plan: continue with IV lasix today, work on BM, needs PT evaluation.   Consultants:   Cardiology    Procedures:  None.  Has RUE PICC line and chronic indwelling Foley catheter from PTA.   Antimicrobials:  none   Subjective: He is more alert today, per wife.  Patient report he had good night. He is breathing better.  No BM since Saturday. Doesn't know if he has pass flatus.  Denies abdominal pain   Objective: Vitals:  11/16/17 0418 11/16/17 0420 11/16/17 0953 11/16/17 1247  BP: (!) 161/69 (!) 161/69 (!) 156/70 (!) 148/59  Pulse: (!) 49 (!) 52 (!) 135 (!) 46  Resp: 16  18  16   Temp: (!) 97 F (36.1 C) (!) 97 F (36.1 C)  98.1 F (36.7 C)  TempSrc:  Oral  Oral  SpO2: 99% 98% 99% 99%  Weight:  85.6 kg    Height:        Intake/Output Summary (Last 24 hours) at 11/16/2017 1534 Last data filed at 11/16/2017 1000 Gross per 24 hour  Intake 770 ml  Output 500 ml  Net 270 ml   Filed Weights   11/14/17 2033 11/15/17 0413 11/16/17 0420  Weight: 87.2 kg 85.2 kg 85.6 kg    Examination:  General exam: Appears calm and comfortable alert  Respiratory system: Bilateral crackles. Marland Kitchen Respiratory effort normal. Cardiovascular system: S1 & S2 heard, RRR. No JVD, murmurs, rubs, gallops or clicks. Trace edema Gastrointestinal system: Abdomen is nondistended, soft and nontender. No organomegaly or masses felt. Normal bowel sounds heard. Central nervous system: Alert and oriented.  Extremities: Symmetric 5 x 5 power. Skin: No rashes, lesions or ulcers   Data Reviewed: I have personally reviewed following labs and imaging studies  CBC: Recent Labs  Lab 11/14/17 1141 11/15/17 0337  WBC 7.8 9.7  NEUTROABS 5.6 7.4  HGB 13.5 12.3*  HCT 43.7 40.0  MCV 101.2* 101.3*  PLT 199 102   Basic Metabolic Panel: Recent Labs  Lab 11/14/17 1141 11/15/17 0337 11/16/17 0259  NA 141 140 138  K 3.8 3.7 4.0  CL 97* 99 97*  CO2 31 31 33*  GLUCOSE 107* 115* 96  BUN 24* 22 23  CREATININE 1.21 1.14 1.19  CALCIUM 10.9* 10.5* 9.5  MG  --  1.9  --    GFR: Estimated Creatinine Clearance: 45.3 mL/min (by C-G formula based on SCr of 1.19 mg/dL). Liver Function Tests: Recent Labs  Lab 11/14/17 1141  AST 18  ALT <5  ALKPHOS 62  BILITOT 0.8  PROT 7.1  ALBUMIN 3.0*   No results for input(s): LIPASE, AMYLASE in the last 168 hours. No results for input(s): AMMONIA in the last 168 hours. Coagulation Profile: Recent Labs  Lab 11/14/17 11/14/17 1141 11/15/17 0337 11/16/17 0259  INR 2.1 1.89 1.72 1.67   Cardiac Enzymes: No results for input(s): CKTOTAL, CKMB,  CKMBINDEX, TROPONINI in the last 168 hours. BNP (last 3 results) No results for input(s): PROBNP in the last 8760 hours. HbA1C: No results for input(s): HGBA1C in the last 72 hours. CBG: No results for input(s): GLUCAP in the last 168 hours. Lipid Profile: No results for input(s): CHOL, HDL, LDLCALC, TRIG, CHOLHDL, LDLDIRECT in the last 72 hours. Thyroid Function Tests: No results for input(s): TSH, T4TOTAL, FREET4, T3FREE, THYROIDAB in the last 72 hours. Anemia Panel: No results for input(s): VITAMINB12, FOLATE, FERRITIN, TIBC, IRON, RETICCTPCT in the last 72 hours. Sepsis Labs: No results for input(s): PROCALCITON, LATICACIDVEN in the last 168 hours.  Recent Results (from the past 240 hour(s))  Culture, blood (routine x 2)     Status: None (Preliminary result)   Collection Time: 11/14/17 11:40 AM  Result Value Ref Range Status   Specimen Description BLOOD RIGHT HAND  Final   Special Requests   Final    BOTTLES DRAWN AEROBIC AND ANAEROBIC Blood Culture adequate volume   Culture   Final    NO GROWTH 2 DAYS Performed at [facility],  [address], [place], [place] [address]    Report Status PENDING  Incomplete  Culture, blood (routine x 2)     Status: None (Preliminary result)   Collection Time: 11/14/17 11:53 AM  Result Value Ref Range Status   Specimen Description BLOOD LEFT HAND  Final   Special Requests   Final    BOTTLES DRAWN AEROBIC AND ANAEROBIC Blood Culture adequate volume   Culture   Final    NO GROWTH 2 DAYS Performed at [facility], [place] [address] [place], [place] [address]    Report Status PENDING  Incomplete         Radiology Studies: Dg Chest Port 1 View  Result Date: 11/16/2017 CLINICAL DATA:  Aspiration pneumonia EXAM: PORTABLE CHEST 1 VIEW COMPARISON:  November 14, 2017 FINDINGS: The mediastinal contour is stable. Heart size is enlarged. Right central venous line is identified distal tip probably in the subclavian/ jugular vein  junction. Patchy opacity of the right mid lung is unchanged. Dense consolidation of the left lung base is unchanged. IMPRESSION: No significant interval change in the patchy consolidation right mid lung and dense consolidation of left lung base compared prior exam. Right central venous line is identified distal tip probably in the subclavian/jugular vein junction. Electronically Signed   By: Abelardo Diesel M.D.   On: 11/16/2017 09:53        Scheduled Meds: . amLODipine  10 mg Oral Daily  . aspirin EC  81 mg Oral Daily  . atorvastatin  10 mg Oral Daily  . bacitracin   Topical BID  . docusate sodium  200 mg Oral BID  . enzalutamide  40 mg Oral Daily  . ezetimibe  10 mg Oral Daily  . furosemide  60 mg Intravenous BID  . [START ON 11/17/2017] furosemide  40 mg Oral BID  . losartan  100 mg Oral Daily  . sodium chloride flush  3 mL Intravenous Q12H  . warfarin  3 mg Oral ONCE-1800  . Warfarin - Pharmacist Dosing Inpatient   Does not apply q1800   Continuous Infusions: . sodium chloride 250 mL (11/15/17 1952)  .  ceFAZolin (ANCEF) IV 2 g (11/16/17 1411)     LOS: 1 day    Time spent: 35 minutes.     Elmarie Shiley, MD [facility] Pager [phone]  If 7PM-7AM, please contact night-coverage [email or web address] Password [facility] 11/16/2017, 3:34 PM

## 2017-11-16 NOTE — Evaluation (Signed)
Physical Therapy Evaluation Patient Details Name: Craig [name]. MRN: [id number] DOB: 1924-09-10 Today's Date: 11/16/2017   History of Present Illness  Pt is a 84 y/o male admitted secondary to worsening SOB and weakness, thought to be secondary to CHF. Pt with recent admission secondary to bacteremia infection. PMH includes metastatic prostate cancer with indwelling foley catheter, HTN, a fib, dCHF, CVA, and CKD.   Clinical Impression  Pt admitted secondary to problem above with deficits below. Pt extremely weak and unable to stand with +2 assist and with use of stedy. Attempted X 3 to stand with max to total A +2, however, pt only with minimal hip clearance. Strongly recommend SNF at d/c, and educated pt and wife about these recommendations, however, pt and wife reports they would like to discuss together before making a decision. If pt decides to go home feel they will need hoyer lift and lift pad to safely perform transfers. Will continue to follow acutely to maximize functional mobility independence and safety.     Follow Up Recommendations SNF;Supervision/Assistance - 24 hour    Equipment Recommendations  Other (comment)(hoyer lift and hoyer lift pad )    Recommendations for Other Services       Precautions / Restrictions Precautions Precautions: Fall Restrictions Weight Bearing Restrictions: No      Mobility  Bed Mobility Overal bed mobility: Needs Assistance Bed Mobility: Supine to Sit;Sit to Supine Rolling: Max assist   Supine to sit: Max assist;+2 for physical assistance Sit to supine: Max assist;+2 for physical assistance   General bed mobility comments: Heavy max A +2 for bed mobility. Required assist with LE and trunk management. Required max A to roll from side to side to place bed pan.   Transfers Overall transfer level: Needs assistance Equipment used: 2 person hand held assist Transfers: Sit to/from Stand Sit to Stand: Max assist;Total assist;+2  physical assistance         General transfer comment: Attempted to stand with +2 HHA, however, pt with minimal hip clearance. Brought stedy in and attempted to stand X2, however, pt stil with minimal hip clearance. Educated pt and wife, that pt currently unsafe to transfer using stedy and would require lift equipment.   Ambulation/Gait             General Gait Details: Unable   Stairs            Wheelchair Mobility    Modified Rankin (Stroke Patients Only)       Balance Overall balance assessment: Needs assistance Sitting-balance support: Bilateral upper extremity supported;Feet supported Sitting balance-Leahy Scale: Poor Sitting balance - Comments: Reliant on BUE and mod A without UE support to maintain sitting balance.    Standing balance support: Bilateral upper extremity supported Standing balance-Leahy Scale: Zero Standing balance comment: external assist required to perform partial stand.                              Pertinent Vitals/Pain Pain Assessment: Faces Faces Pain Scale: Hurts little more Pain Location: back  Pain Descriptors / Indicators: Aching;Guarding;Grimacing Pain Intervention(s): Limited activity within patient's tolerance;Monitored during session;Repositioned    Home Living Family/patient expects to be discharged to:: Private residence Living Arrangements: Spouse/significant other Available Help at Discharge: Family;Available 24 hours/day Type of Home: House Home Access: Other (comment)(chair lift )     Home Layout: One level Home Equipment: Walker - 2 wheels;Bedside commode;Wheelchair - manual;Hospital bed;Other (comment)(stedy)  Prior Function Level of Independence: Needs assistance   Gait / Transfers Assistance Needed: Pt reports wife assisted with use of stedy to transfer from chair/bed to [facility].   ADL's / Homemaking Assistance Needed: Pt's wife reports she sponge bathes him.         Hand Dominance         Extremity/Trunk Assessment   Upper Extremity Assessment Upper Extremity Assessment: Generalized weakness    Lower Extremity Assessment Lower Extremity Assessment: Generalized weakness    Cervical / Trunk Assessment Cervical / Trunk Assessment: Kyphotic  Communication   Communication: No difficulties  Cognition Arousal/Alertness: Awake/alert Behavior During Therapy: WFL for tasks assessed/performed Overall Cognitive Status: Within Functional Limits for tasks assessed                                        General Comments General comments (skin integrity, edema, etc.): Pt's wife present during session. Educated about recommendations for SNF, however, pt's wife reports they would like to talk about it first. Educated that if they decide to go home, feel lift equipment would be the safest mode for transfers.     Exercises     Assessment/Plan    PT Assessment Patient needs continued PT services  PT Problem List Decreased strength;Decreased balance;Decreased mobility;Decreased cognition;Decreased knowledge of use of DME       PT Treatment Interventions DME instruction;Functional mobility training;Therapeutic activities;Therapeutic exercise;Balance training;Patient/family education    PT Goals (Current goals can be found in the Care Plan section)  Acute Rehab PT Goals Patient Stated Goal: to go home if possible PT Goal Formulation: With patient/family Time For Goal Achievement: 11/30/17 Potential to Achieve Goals: Fair    Frequency Min 3X/week   Barriers to discharge        Co-evaluation               AM-PAC PT "6 Clicks" Daily Activity  Outcome Measure Difficulty turning over in bed (including adjusting bedclothes, sheets and blankets)?: Unable Difficulty moving from lying on back to sitting on the side of the bed? : Unable Difficulty sitting down on and standing up from a chair with arms (e.g., wheelchair, bedside commode, etc,.)?: Unable Help  needed moving to and from a bed to chair (including a wheelchair)?: Total Help needed walking in hospital room?: Total Help needed climbing 3-5 steps with a railing? : Total 6 Click Score: 6    End of Session Equipment Utilized During Treatment: Gait belt Activity Tolerance: Patient tolerated treatment well Patient left: in bed;with call bell/phone within reach;with bed alarm set Nurse Communication: Mobility status;Need for lift equipment PT Visit Diagnosis: Unsteadiness on feet (R26.81);Muscle weakness (generalized) (M62.81);History of falling (Z91.81);Difficulty in walking, not elsewhere classified (R26.2)    Time: 1133-1203 PT Time Calculation (min) (ACUTE ONLY): 30 min   Charges:   PT Evaluation $PT Eval Moderate Complexity: 1 Mod PT Treatments $Therapeutic Activity: 8-22 mins        Leighton Ruff, PT, DPT  Acute Rehabilitation Services  Pager: [phone] Office: [phone]   Rudean Hitt 11/16/2017, 1:52 PM

## 2017-11-16 NOTE — Progress Notes (Signed)
Paged cardiology-  Patient resting heart rate is in mid- upper 40s.   Do they still want use to give coreg.     Cardiology called back- hold for now until  And will assess patient .

## 2017-11-16 NOTE — Progress Notes (Signed)
NP on call is aware of pt's elevated b/p tonight. Will continue to monitor.

## 2017-11-16 NOTE — Progress Notes (Signed)
[place] for Warfarin Indication: atrial fibrillation  Allergies  Allergen Reactions  . Pravachol Other (See Comments)    Muscle weakness  . Zocor [Simvastatin - High Dose] Other (See Comments)    Muscle weakness    Patient Measurements: Height: 6' (182.9 cm) Weight: 188 lb 11.4 oz (85.6 kg) IBW/kg (Calculated) : 77.6  Vital Signs: Temp: 98.1 F (36.7 C) (09/25 1247) Temp Source: Oral (09/25 1247) BP: 148/59 (09/25 1247) Pulse Rate: 46 (09/25 1247)  Labs: Recent Labs    11/14/17 1141 11/15/17 0337 11/16/17 0259  HGB 13.5 12.3*  --   HCT 43.7 40.0  --   PLT 199 212  --   LABPROT 21.5* 20.0* 19.5*  INR 1.89 1.72 1.67  CREATININE 1.21 1.14 1.19    Estimated Creatinine Clearance: 45.3 mL/min (by C-G formula based on SCr of 1.19 mg/dL).   Medical History: Past Medical History:  Diagnosis Date  . Atrial fibrillation ([place])   . Chronic back pain   . GERD (gastroesophageal reflux disease)   . H/O hiatal hernia   . History of epistaxis 07/19/2002  . Hyperlipidemia   . Hypertension   . Hypertensive heart disease   . Hypokalemia   . ICH (intracerebral hemorrhage) ([place]) ~ 1999   after TPA/notes 09/27/2012  . Melanoma ([place])    "top of my head" (09/27/2012)  . Migraines    "migraines without headaches years ago" (09/27/2012)  . Osteoarthritis of both feet   . PAT (paroxysmal atrial tachycardia) ([place])   . Personal history of long-term (current) use of anticoagulants   . Pneumonia    "once; several years ago" (09/27/2012)  . Prostate cancer, primary, with metastasis from prostate to other site [facility])    on Lupron injections per GU  . S/P radiation therapy 02/13/14-02/27/14   SRS 5/5 spine completed 02/27/14  . S/P radiation therapy 05/27/14-05/31/14   right femur 20Gy/42fx  . Stroke [facility]) ~ 1999   ischemic / right cerebellar/posterior inferior cerebellar artery / right pos infarct    Assessment: 84 yo male admitted with acute on chronic  diastolic CHF on warfarin chronically for atrial fibrillation. Pharmacy was consulted to restart warfarin therapy.  Home regimen: 5 mg M,Th,Sa and 2.5 mg Sun,T,W,F (last dose 9/22)  INR today remains subtherapeutic at 1.67 - likely secondary to no dose on 9/23. Received warfarin 5 mg last night. Hgb 12.3, plt 212 on last check.   Goal of Therapy:  INR 2-3   Plan:  Will give warfarin 3 mg po x 1 tonight F/u INR daily Discussed with cardiology - no need for bridge at this time  Doylene Canard, PharmD Clinical Pharmacist  Pager: [phone] Phone: [phone] Please see AMION for all Pharmacists' Contact Phone Numbers 11/16/2017, 1:27 PM

## 2017-11-16 NOTE — Clinical Social Work Note (Signed)
Clinical Social Work Assessment  Patient Details  Name: Craig [name]. MRN: [id number] Date of Birth: 07/03/1924  Date of referral:  11/16/17               Reason for consult:  Facility Placement, Discharge Planning                Permission sought to share information with:  Facility Sport and exercise psychologist, Family Supports Permission granted to share information::  Yes, Verbal Permission Granted  Name::     Craig [name]::  SNFs  Relationship::  spouse  Contact Information:  [phone]  Housing/Transportation Living arrangements for the past 2 months:  Single Family Home Source of Information:  Spouse Patient Interpreter Needed:  None Criminal Activity/Legal Involvement Pertinent to Current Situation/Hospitalization:  No - Comment as needed Significant Relationships:  Spouse Lives with:  Spouse Do you feel safe going back to the place where you live?  Yes Need for family participation in patient care:  Yes (Comment)  Care giving concerns: Patient from home with spouse. PT recommending SNF. Patient on IV ancef until 10/22.   Social Worker assessment / plan: CSW met with patient and spouse at bedside. Patient alert and oriented, though spouse provided majority of information. CSW introduced self and role and discussed disposition planning. Spouse agreeable to SNF at [place]. If [facility] cannot offer, spouse may consider [facility], but would probably take patient back home.   CSW sent out initial SNF referrals and spoke to [facility] admissions on the phone. Awaiting bed offers and will provide to patient and spouse when available.  Patient will also require [facility] authorization before admitting to a facility.  CSW to follow and support with disposition planning.  Employment status:  Retired Research officer, political party) PT Recommendations:  [place] / Referral to community resources:  [place]  Patient/Family's Response to care: Patient and spouse appreciative of care.  Patient/Family's Understanding of and Emotional Response to Diagnosis, Current Treatment, and Prognosis: Patient and spouse with good understanding of patient's conditions and agreeable to SNF at [place].  Emotional Assessment Appearance:  Appears stated age Attitude/Demeanor/Rapport:  Engaged Affect (typically observed):  Accepting, Calm, Appropriate, Pleasant Orientation:  Oriented to Self, Oriented to Place, Oriented to  Time, Oriented to Situation Alcohol / Substance use:  Not Applicable Psych involvement (Current and /or in the community):  No (Comment)  Discharge Needs  Concerns to be addressed:  Discharge Planning Concerns, Care Coordination Readmission within the last 30 days:  Yes Current discharge risk:  Physical Impairment Barriers to Discharge:  Continued Medical Work up   Estanislado Emms, LCSW 11/16/2017, 4:03 PM

## 2017-11-16 NOTE — Consult Note (Signed)
   [facility] CM Inpatient Consult   11/16/2017  Craig [name] [birth year] [id number]  Patient screened for readmission less than 30 days in the  [place] and assessed for Care Management services. Patient is in the [facility] of the [place] Management services under patient's [employer] plan.   Patient is currently being recommended for a skilled facility stay. Admitted with SOB with HX of HF and Bacteremia.  Will follow for disposition and needs.  For questions contact:   Natividad Brood, RN BSN [facility] Liaison  [phone] business mobile phone Toll free office [phone]

## 2017-11-16 NOTE — NC FL2 (Signed)
[place] LEVEL OF CARE SCREENING TOOL     IDENTIFICATION  Patient Name: Craig [name]. Birthdate: 05-24-1924 Sex: male Admission Date (Current Location): 11/14/2017  [facility] and [place] Number:  Herbalist and Address:  [facility] [place], [place] [address], [place], [place] [address]      Provider Number: [id number]  Attending Physician Name and Address:  Elmarie Shiley, MD  Relative Name and Phone Number:  Maryruth Bun, spouse, [phone]    Current Level of Care: Hospital Recommended Level of Care: [place] Prior Approval Number:    Date Approved/Denied:   PASRR Number: [id number]  Discharge Plan: SNF    Current Diagnoses: Patient Active Problem List   Diagnosis Date Noted  . Aspiration pneumonia ([place])   . Acute respiratory failure with hypoxia ([place])   . NSVT (nonsustained ventricular tachycardia) ([place])   . Acute on chronic diastolic CHF (congestive heart failure) ([place]) 11/14/2017  . Endocarditis of native valve   . Bacteremia, coagulase-negative staphylococcal 11/01/2017  . UTI (urinary tract infection) 10/29/2017  . Bone cancer ([place]) 12/01/2015  . History of recurrent TIAs 12/01/2015  . Chronic anticoagulation 12/01/2015  . Hearing difficulty 12/01/2015  . At high risk for complication of immobility 12/01/2015  . Aortic stenosis 07/15/2015  . Stroke ([place])   . Hemispheric carotid artery syndrome   . Cardiomyopathy, ischemic   . Stroke-like symptoms 12/31/2014  . Catheter-associated urinary tract infection ([place]) 12/31/2014  . Hemoptysis 07/12/2014  . Hypertensive urgency 07/12/2014  . Pulmonary embolism ([place])   . Cough with hemoptysis   . Pulmonary embolus ([place]) 07/11/2014  . Fever 04/22/2014  . Dysarthria 04/21/2014  . CVA (cerebral infarction) 04/19/2014  . Prostate cancer, primary, with metastasis from prostate to other site [facility]) 04/19/2014  . Chronic back pain 04/19/2014  .  Expressive aphasia   . Bone metastasis ([place]) 02/12/2014  . Chronic diastolic heart failure ([place]) 02/11/2014  . Hypertensive heart disease 02/11/2014  . Essential hypertension 02/11/2014  . History of stroke 02/11/2014  . AAA (abdominal aortic aneurysm) ([place]) 02/11/2014  . Hypokalemia 02/11/2014  . Back pain 02/09/2014  . Fluid overload 02/08/2014  . Left-sided weakness 02/08/2014  . Encounter for therapeutic drug monitoring 05/14/2013  . CKD (chronic kidney disease) stage 3, GFR 30-59 ml/min (HCC) 09/27/2012  . Elevated brain natriuretic peptide (BNP) level 09/27/2012  . Hypercalcemia 09/27/2012  . Weakness 09/27/2012  . Epistaxis 01/08/2011  . Cerebellar stroke syndrome 07/27/2010  . Hypercholesterolemia 07/27/2010  . Prostate cancer metastatic to bone ([place]) 07/27/2010  . Benign hypertensive heart disease without heart failure 07/27/2010  . Renal insufficiency 07/27/2010  . Chronic atrial fibrillation (HCC) 05/25/2010    Orientation RESPIRATION BLADDER Height & Weight     Self, Time, Situation, Place  O2(nasal cannula 1L) Incontinent, Indwelling catheter Weight: 188 lb 11.4 oz (85.6 kg) Height:  6' (182.9 cm)  BEHAVIORAL SYMPTOMS/MOOD NEUROLOGICAL BOWEL NUTRITION STATUS      Continent Diet(please see DC summary)  AMBULATORY STATUS COMMUNICATION OF NEEDS Skin   Extensive Assist Verbally Normal                       Personal Care Assistance Level of Assistance  Bathing, Feeding, Dressing Bathing Assistance: Maximum assistance Feeding assistance: Limited assistance Dressing Assistance: Maximum assistance     Functional Limitations Info  Sight, Hearing, Speech Sight Info: Adequate Hearing Info: Impaired Speech Info: Adequate    SPECIAL CARE FACTORS FREQUENCY  PT (By licensed PT), OT (By licensed OT)     PT Frequency: 5x/week OT Frequency: 5x/week            Contractures Contractures Info: Not present    Additional Factors Info  Code Status, Allergies  Code Status Info: Full Allergies Info: Pravachol, Zocor Simvastatin - High Dose           Current Medications (11/16/2017):  This is the current hospital active medication list Current Facility-Administered Medications  Medication Dose Route Frequency Provider Last Rate Last Dose  . 0.9 %  sodium chloride infusion  250 mL Intravenous PRN Karmen Bongo, MD 10 mL/hr at 11/15/17 1952 250 mL at 11/15/17 1952  . acetaminophen (TYLENOL) tablet 650 mg  650 mg Oral Q4H PRN Karmen Bongo, MD      . amLODipine (NORVASC) tablet 10 mg  10 mg Oral Daily Karmen Bongo, MD   10 mg at 11/16/17 0957  . aspirin EC tablet 81 mg  81 mg Oral Daily Karmen Bongo, MD   81 mg at 11/16/17 0957  . atorvastatin (LIPITOR) tablet 10 mg  10 mg Oral Daily Karmen Bongo, MD   10 mg at 11/16/17 0958  . bacitracin ointment   Topical BID Regalado, Belkys A, MD      . bisacodyl (DULCOLAX) EC tablet 5 mg  5 mg Oral Daily PRN Regalado, Belkys A, MD      . ceFAZolin (ANCEF) IVPB 2g/100 mL premix  2 g Intravenous Lynne Logan, MD 200 mL/hr at 11/16/17 0506 2 g at 11/16/17 0506  . docusate sodium (COLACE) capsule 200 mg  200 mg Oral BID Regalado, Belkys A, MD   200 mg at 11/16/17 1059  . enzalutamide (XTANDI) capsule 40 mg  40 mg Oral Daily Modena Jansky, MD   40 mg at 11/16/17 1059  . ezetimibe (ZETIA) tablet 10 mg  10 mg Oral Daily Karmen Bongo, MD   10 mg at 11/16/17 0957  . furosemide (LASIX) injection 60 mg  60 mg Intravenous BID Reino Bellis B, NP   60 mg at 11/16/17 0957  . [START ON 11/17/2017] furosemide (LASIX) tablet 40 mg  40 mg Oral BID Reino Bellis B, NP      . losartan (COZAAR) tablet 100 mg  100 mg Oral Daily Karmen Bongo, MD   100 mg at 11/16/17 0957  . ondansetron (ZOFRAN) injection 4 mg  4 mg Intravenous Q6H PRN Karmen Bongo, MD      . oxybutynin (DITROPAN) tablet 5 mg  5 mg Oral Daily PRN Karmen Bongo, MD      . sodium chloride flush (NS) 0.9 % injection 10-40 mL  10-40 mL  Intracatheter PRN Karmen Bongo, MD      . sodium chloride flush (NS) 0.9 % injection 3 mL  3 mL Intravenous Q12H Karmen Bongo, MD   3 mL at 11/14/17 2316  . sodium chloride flush (NS) 0.9 % injection 3 mL  3 mL Intravenous PRN Karmen Bongo, MD      . warfarin (COUMADIN) tablet 3 mg  3 mg Oral ONCE-1800 Cheryln Manly, NP      . Warfarin - Pharmacist Dosing Inpatient   Does not apply q1800 Modena Jansky, MD         Discharge Medications: Please see discharge summary for a list of discharge medications.  Relevant Imaging Results:  Relevant Lab Results:   Additional Information SSN: [id number]  Estanislado Emms, LCSW

## 2017-11-16 NOTE — Progress Notes (Signed)
  Vital Signs MEWS/VS Documentation      11/16/2017 0953 11/16/2017 1000 11/16/2017 1247 11/16/2017 1823   MEWS Score:  3  3  1  1    MEWS Score Color:  Yellow  Yellow  Green  Green   Resp:  -  -  16  -   Pulse:  (!) 135  -  (!) 46  (!) 50   BP:  (!) 156/70  -  (!) 148/59  (!) 152/61   Temp:  -  -  98.1 F (36.7 C)  -   O2 Device:  Nasal Cannula  -  Nasal Cannula  Nasal Cannula   O2 Flow Rate (L/min):  1 L/min  -  1 L/min  1 L/min   Level of Consciousness:  -  Alert  -  -      Patient has A-fib.  Non-symptomatic.      Craig [name] L [name] 11/16/2017,7:02 PM

## 2017-11-17 ENCOUNTER — Inpatient Hospital Stay (HOSPITAL_COMMUNITY): Payer: Medicare Other

## 2017-11-17 LAB — BASIC METABOLIC PANEL
ANION GAP: 10 (ref 5–15)
BUN: 25 mg/dL — ABNORMAL HIGH (ref 8–23)
CALCIUM: 9.6 mg/dL (ref 8.9–10.3)
CO2: 30 mmol/L (ref 22–32)
Chloride: 97 mmol/L — ABNORMAL LOW (ref 98–111)
Creatinine, Ser: 1.23 mg/dL (ref 0.61–1.24)
GFR, EST AFRICAN AMERICAN: 58 mL/min — AB (ref >60)
GFR, EST NON AFRICAN AMERICAN: 50 mL/min — AB (ref >60)
Glucose, Bld: 106 mg/dL — ABNORMAL HIGH (ref 70–99)
Potassium: 3.5 mmol/L (ref 3.5–5.1)
Sodium: 137 mmol/L (ref 135–145)

## 2017-11-17 LAB — PROTIME-INR
INR: 1.79
PROTHROMBIN TIME: 20.7 s — AB (ref 11.4–15.2)

## 2017-11-17 LAB — MAGNESIUM: MAGNESIUM: 1.9 mg/dL (ref 1.7–2.4)

## 2017-11-17 MED ORDER — POTASSIUM CHLORIDE CRYS ER 20 MEQ PO TBCR
20.0000 meq | EXTENDED_RELEASE_TABLET | Freq: Every day | ORAL | Status: DC
  Administered 2017-11-17 – 2017-11-18 (×2): 20 meq via ORAL
  Filled 2017-11-17 (×2): qty 1

## 2017-11-17 MED ORDER — FUROSEMIDE 40 MG PO TABS: 40.0000 mg | ORAL_TABLET | Freq: Two times a day (BID) | ORAL | 0 refills | Status: DC

## 2017-11-17 MED ORDER — BISACODYL 10 MG RE SUPP
10.0000 mg | Freq: Once | RECTAL | Status: AC
  Administered 2017-11-17: 10 mg via RECTAL
  Filled 2017-11-17: qty 1

## 2017-11-17 MED ORDER — POLYETHYLENE GLYCOL 3350 17 G PO PACK
17.0000 g | PACK | Freq: Two times a day (BID) | ORAL | Status: DC
  Administered 2017-11-17 – 2017-11-18 (×2): 17 g via ORAL
  Filled 2017-11-17 (×2): qty 1

## 2017-11-17 MED ORDER — POTASSIUM CHLORIDE CRYS ER 20 MEQ PO TBCR: 20.0000 meq | EXTENDED_RELEASE_TABLET | Freq: Every day | ORAL | 0 refills | Status: DC

## 2017-11-17 MED ORDER — DOCUSATE SODIUM 100 MG PO CAPS
200.0000 mg | ORAL_CAPSULE | Freq: Two times a day (BID) | ORAL | Status: DC
  Administered 2017-11-18: 200 mg via ORAL
  Filled 2017-11-17 (×2): qty 2

## 2017-11-17 MED ORDER — POLYETHYLENE GLYCOL 3350 17 G PO PACK: 17.0000 g | PACK | Freq: Two times a day (BID) | ORAL | Status: DC

## 2017-11-17 MED ORDER — SENNA 8.6 MG PO TABS
1.0000 | ORAL_TABLET | Freq: Two times a day (BID) | ORAL | Status: DC
  Administered 2017-11-17 – 2017-11-18 (×2): 8.6 mg via ORAL
  Filled 2017-11-17 (×2): qty 1

## 2017-11-17 MED ORDER — WARFARIN SODIUM 3 MG PO TABS
6.0000 mg | ORAL_TABLET | Freq: Once | ORAL | Status: AC
  Administered 2017-11-17: 6 mg via ORAL
  Filled 2017-11-17: qty 2

## 2017-11-17 MED ORDER — SENNA 8.6 MG PO TABS: 1.0000 | ORAL_TABLET | Freq: Two times a day (BID) | ORAL | Status: DC

## 2017-11-17 MED ORDER — WARFARIN SODIUM 5 MG PO TABS: 2.5000 mg | ORAL_TABLET | ORAL | 0 refills | Status: DC

## 2017-11-17 NOTE — Progress Notes (Signed)
Craig [name] awaiting insurance authorization.   Craig Locus [name] LCSW [phone]

## 2017-11-17 NOTE — Discharge Summary (Signed)
Physician Discharge Summary  Craig [name]. NAT:[id number] DOB: [birth year] DOA: 11/14/2017  PCP: Mellody Dance, DO  Admit date: 11/14/2017 Discharge date: 11/17/2017  Admitted From: Home  Disposition: SNF  Recommendations for Outpatient Follow-up:  1. Follow up with PCP in 1-2 weeks 2. Please obtain BMP/CBC in one week 3. Monitor weight, urine out put.  4. 1 L oxygen     Discharge Condition: stable.  CODE STATUS: Full code.  Diet recommendation: Heart Healthy   Brief/Interim Summary: Brief Narrative: 84 year old married male, retired [employer], PMH of HTN, HLD, PAF, stage III chronic kidney disease, chronic indwelling Foley catheter, chronic diastolic CHF, CVA, nonambulatory for couple of years, metastatic prostate cancer status post radiation therapy and Lupron injections, recent hospitalization 10/29/2017-11/03/2017 for sepsis due to StaphylococcusLugdunensisbacteremia, CAUTI, TEE without vegetations, ID consulted and recommended IV cefazolin for 6 weeks via PICC line to empirically treat for bacterial endocarditis (end date 12/13/2017) presented to [facility] ED on 11/14/2017 due to ongoing poor oral intake since recent hospital discharge, recurrent fluid buildup treated at home with extra doses of Lasix, but dyspnea got progressively worse, spouse called primary cardiologist office hoping that they would prescribe home oxygen but was advised to come to office for revisit, patient profoundly weak and hence presented to ED. Additionally reported mild chest tightness, mild abdominal pain, occasional dry cough which is at baseline, chronic dysphagia but compensates with slow chewing or swallowing. EMS noted oxygen saturations in the low 90s and placed on oxygen. Admitted for acute hypoxic respiratory failure secondary to acute on chronic diastolic CHF,? Aspiration pneumonia. Cardiology and ID were consulted at spouse's request.   Assessment & Plan:   Principal Problem:   Acute on  chronic diastolic CHF (congestive heart failure) (HCC) Active Problems:   Chronic atrial fibrillation (HCC)   Hypercholesterolemia   CKD (chronic kidney disease) stage 3, GFR 30-59 ml/min (HCC)   Hypercalcemia   Essential hypertension   History of stroke   Bacteremia, coagulase-negative staphylococcal   Aspiration pneumonia (HCC)   Acute respiratory failure with hypoxia (HCC)   NSVT (nonsustained ventricular tachycardia) (HCC)   Acute hypoxic respiratory failure.  Related to CHF exacerbation, ? Aspiration PNA.  On IV lasix.  Evaluation for home oxygen.   Acute on chronic Diastolic HF exacerbation;  TTE 11/02/2017: LVEF 55%, basal inferior hypokinesis, diastolic dysfunction could not be evaluated, mild to moderate aortic stenosis, moderate TR with severe pulmonary hypertension. Treated with V lasix. He was transition to oral lasix 40 Mg BID. Monitor weight.  Cardiology following.  Weight 192--188--189.   Aspiration PNA ? Evaluated by speech regular diet.  ID consulted and following.  chest x ray stable right upper infiltrate.  On IV Cefazolin for staph Bacteremia; Follow ID recommendation. Per ID no need to change antibiotics.  Had coughing spell after drinking tea, patient and wife decline re-evaluation by speech. He would not want thickener. They understand risk. He has had that problem for a while and he has been able to compensate and cough. He continue to be afebrile.   Constipation;  Will try oral dulcolax, if that doesn't work will need suppository   Staph Lugdunensis Bacteremia;  On IV Cefazolin.  Last dose of cefazolin 10-22  Constipation.  Resume docusate. PRN dulcolax.   Adult failure to thrive;  Multifactorial.    Chronic opacity medial left lung base: Follow up out patient, if family wishes to pursue this.  Chest x ray stable.  Incentive spirometry.   Metastatic prostate cancer: Continue home dose  of Xtandi.  Chronic indwelling Foley  catheter: Continue oxybutin.   History of CVA; on coumadin.    Stage III chronic kidney disease: Creatinine normal and at baseline. follow   A fib;  On coumadin. Carvedilol discontinue due to bradycardia/   Discharge Diagnoses:  Principal Problem:   Acute on chronic diastolic CHF (congestive heart failure) (HCC) Active Problems:   Chronic atrial fibrillation (HCC)   Hypercholesterolemia   CKD (chronic kidney disease) stage 3, GFR 30-59 ml/min (HCC)   Hypercalcemia   Essential hypertension   History of stroke   Bacteremia, coagulase-negative staphylococcal   Aspiration pneumonia (HCC)   Acute respiratory failure with hypoxia (HCC)   NSVT (nonsustained ventricular tachycardia) ([place])    Discharge Instructions   Allergies as of 11/17/2017      Reactions   Pravachol Other (See Comments)   Muscle weakness   Zocor [simvastatin - High Dose] Other (See Comments)   Muscle weakness      Medication List    TAKE these medications   acetaminophen 500 MG tablet Commonly known as:  TYLENOL Take 1,000 mg by mouth daily.   amLODipine 10 MG tablet Commonly known as:  NORVASC Take 1 tablet (10 mg total) by mouth daily.   atorvastatin 10 MG tablet Commonly known as:  LIPITOR Take 1 tablet (10 mg total) by mouth daily.   B-complex with vitamin C tablet Take 1 tablet by mouth daily.   calcium carbonate 500 MG chewable tablet Commonly known as:  TUMS - dosed in mg elemental calcium Chew 1 tablet by mouth daily.   ceFAZolin  IVPB Commonly known as:  ANCEF Inject 2 g into the vein every 8 (eight) hours. Indication:  Staphylococcal lugdunensis bacteremis/endocarditis Last Day of Therapy:  Dec 13, 2017 Labs - Once weekly:  CBC/D and BMP, Labs - Every other week:  ESR and CRP   ezetimibe 10 MG tablet Commonly known as:  ZETIA Take 1 tablet (10 mg total) by mouth daily.   FISH OIL PO Take 1 capsule by mouth daily. ( Mega Red )   furosemide 40 MG tablet Commonly  known as:  LASIX Take 1 tablet (40 mg total) by mouth 2 (two) times daily. What changed:  when to take this   losartan 100 MG tablet Commonly known as:  COZAAR Take 1 tablet (100 mg total) by mouth daily.   LUPRON DEPOT IM Inject 1 each into the muscle every 6 (six) months.   multivitamin per tablet Take 1 tablet by mouth daily. ( with B - Complex )   ondansetron 4 MG disintegrating tablet Commonly known as:  ZOFRAN-ODT Take 1 tablet (4 mg total) by mouth every 8 (eight) hours as needed for nausea or vomiting.   oxybutynin 5 MG tablet Commonly known as:  DITROPAN Take 5 mg by mouth daily as needed for bladder spasms.   potassium chloride SA 20 MEQ tablet Commonly known as:  K-DUR,KLOR-CON Take 1 tablet (20 mEq total) by mouth daily.   PROBIOTIC ADVANCED PO Take 1 capsule by mouth daily.   STOOL SOFTENER 100 MG capsule Generic drug:  docusate sodium Take 200 mg by mouth 2 (two) times daily.   warfarin 5 MG tablet Commonly known as:  COUMADIN Take as directed. If you are unsure how to take this medication, talk to your nurse or doctor. Original instructions:  Take 0.5-1 tablets (2.5-5 mg total) by mouth See admin instructions. Take one tablet (6m) on M TH SAT and half tablet (2.553m on  SUN T W F or AS DIRECTED BY THE COUMADIN CLINIC   XGEVA [place] Inject into the skin every 30 (thirty) days.   XTANDI 40 MG capsule Generic drug:  enzalutamide TAKE 1 CAPSULE (40 MG) BY MOUTH ONCE DAILY What changed:  See the new instructions.      Follow-up Information    [facility].   Specialty:  Home Health Services Why:  Home Health RN-agency will call to arrange initial visit Contact information: [address] [place] [address] [phone]          Allergies  Allergen Reactions  . Pravachol Other (See Comments)    Muscle weakness  . Zocor [Simvastatin - High Dose] Other (See Comments)    Muscle weakness     Consultations: Cardiology   Procedures/Studies: Dg Chest Port 1 View  Result Date: 11/16/2017 CLINICAL DATA:  Aspiration pneumonia EXAM: PORTABLE CHEST 1 VIEW COMPARISON:  November 14, 2017 FINDINGS: The mediastinal contour is stable. Heart size is enlarged. Right central venous line is identified distal tip probably in the subclavian/ jugular vein junction. Patchy opacity of the right mid lung is unchanged. Dense consolidation of the left lung base is unchanged. IMPRESSION: No significant interval change in the patchy consolidation right mid lung and dense consolidation of left lung base compared prior exam. Right central venous line is identified distal tip probably in the subclavian/jugular vein junction. Electronically Signed   By: Wei-Chen  Lin M.D.   On: 11/16/2017 09:53   Dg Chest Port 1 View  Result Date: 11/14/2017 CLINICAL DATA:  Pt arrives from home with [facility] from home with c/o of sob- pt has PICC line in place for treatment of known Staph infection. Pt denies any pain. At home ems noted sats to be in low 90's. Pt placed on 2L . EXAM: PORTABLE CHEST 1 VIEW COMPARISON:  None. FINDINGS: Patient has RIGHT-sided PICC line, tip overlying the level of superior vena cava. The heart is enlarged. There is dense atherosclerotic calcification of the thoracic aorta. Persistent dense opacity at the LEFT lung base obscures the hemidiaphragm and is consistent with atelectasis, consolidation, or scarring. There is faint patchy opacity in the LATERAL RIGHT UPPER lobe, slightly increased compared to prior studies. IMPRESSION: 1. Chronic opacity in the MEDIAL LEFT lung base. Findings are consistent with atelectasis, scarring, or consolidation. 2. Question developing infiltrate in the RIGHT UPPER lobe. 3. Cardiomegaly. 4.  Aortic atherosclerosis.  (ICD10-I70.0) Electronically Signed   By: Elizabeth  Brown M.D.   On: 11/14/2017 12:07   Dg Chest Port 1 View  Result Date: 11/02/2017 CLINICAL DATA:  PICC  placement EXAM: PORTABLE CHEST 1 VIEW COMPARISON:  10/29/2017, 07/17/2014 FINDINGS: Right upper extremity catheter tip overlies the SVC. Cardiomegaly with trace right pleural effusion and airspace disease at the left base. Aortic atherosclerosis. No pneumothorax. IMPRESSION: 1. Right upper extremity catheter tip overlies the SVC. 2. Cardiomegaly 3. Probable tiny right effusion. Airspace disease at the left base which may reflect atelectasis or an infiltrate. Electronically Signed   By: Kim  Fujinaga M.D.   On: 11/02/2017 18:20   Dg Chest Port 1 View  Result Date: 10/29/2017 CLINICAL DATA:  Altered mental status and fever. Ex-smoker. EXAM: PORTABLE CHEST 1 VIEW COMPARISON:  07/17/2014. FINDINGS: Stable enlarged cardiac silhouette and tortuous and calcified thoracic aorta. The interstitial markings remain mildly prominent. Stable small amount of linear scarring in the right mid lung zone. Thoracic spine degenerative changes. IMPRESSION: Stable cardiomegaly and mild chronic interstitial lung   disease. No acute abnormality. Electronically Signed   By: Claudie Revering M.D.   On: 10/29/2017 19:46   [place] Ekg Site Rite  Result Date: 11/02/2017 If Site Rite image not attached, placement could not be confirmed due to current cardiac rhythm.     Subjective: He is feeling well, breathing well. No BM yet   Discharge Exam: Vitals:   11/17/17 0529 11/17/17 0810  BP: (!) 159/67 (!) 165/82  Pulse: 82 65  Resp: 18 18  Temp: 98 F (36.7 C) 98.4 F (36.9 C)  SpO2: 98% 98%   Vitals:   11/16/17 1823 11/16/17 1941 11/17/17 0529 11/17/17 0810  BP: (!) 152/61 (!) 147/65 (!) 159/67 (!) 165/82  Pulse: (!) 50 (!) 54 82 65  Resp:  _0 Temp:  (!) 97 F (36.1 C) 98 F (36.7 C) 98.4 F (36.9 C)  TempSrc:   Oral Oral  SpO2: 97% 99% 98% 98%  Weight:   85.9 kg   Height:        General: Pt is alert, awake, not in acute distress Cardiovascular: RRR, S1/S2  Respiratory: CTA bilaterally, no wheezing, no  rhonchi Abdominal: Soft, NT, ND, bowel sounds + Extremities: no edema, no cyanosis    The results of significant diagnostics from this hospitalization (including imaging, microbiology, ancillary and laboratory) are listed below for reference.     Microbiology: Recent Results (from the past 240 hour(s))  Culture, blood (routine x 2)     Status: None (Preliminary result)   Collection Time: 11/14/17 11:40 AM  Result Value Ref Range Status   Specimen Description BLOOD RIGHT HAND  Final   Special Requests   Final    BOTTLES DRAWN AEROBIC AND ANAEROBIC Blood Culture adequate volume   Culture   Final    NO GROWTH 3 DAYS Performed at [facility], [address], [place], [place] [address]    Report Status PENDING  Incomplete  Culture, blood (routine x 2)     Status: None (Preliminary result)   Collection Time: 11/14/17 11:53 AM  Result Value Ref Range Status   Specimen Description BLOOD LEFT HAND  Final   Special Requests   Final    BOTTLES DRAWN AEROBIC AND ANAEROBIC Blood Culture adequate volume   Culture   Final    NO GROWTH 3 DAYS Performed at [facility], [place] [address], [place], [place] [address]    Report Status PENDING  Incomplete     Labs: BNP (last 3 results) Recent Labs    11/14/17 1141  BNP 117.3*   Basic Metabolic Panel: Recent Labs  Lab 11/14/17 1141 11/15/17 0337 11/16/17 0259 11/17/17 0311  NA 141 140 138 137  K 3.8 3.7 4.0 3.5  CL 97* 99 97* 97*  CO2 31 31 33* 30  GLUCOSE 107* 115* 96 106*  BUN 24* 22 23 25*  CREATININE 1.21 1.14 1.19 1.23  CALCIUM 10.9* 10.5* 9.5 9.6  MG  --  1.9  --  1.9   Liver Function Tests: Recent Labs  Lab 11/14/17 1141  AST 18  ALT <5  ALKPHOS 62  BILITOT 0.8  PROT 7.1  ALBUMIN 3.0*   No results for input(s): LIPASE, AMYLASE in the last 168 hours. No results for input(s): AMMONIA in the last 168 hours. CBC: Recent Labs  Lab 11/14/17 1141 11/15/17 0337  WBC 7.8 9.7  NEUTROABS 5.6 7.4  HGB  13.5 12.3*  HCT 43.7 40.0  MCV 101.2* 101.3*  PLT 199 212   Cardiac Enzymes: No results for input(s): CKTOTAL, CKMB, CKMBINDEX, TROPONINI in the last 168 hours. BNP: Invalid input(s): POCBNP CBG: No results for input(s): GLUCAP in the last 168 hours. D-Dimer No results for input(s): DDIMER in the last 72 hours. Hgb A1c No results for input(s): HGBA1C in the last 72 hours. Lipid Profile No results for input(s): CHOL, HDL, LDLCALC, TRIG, CHOLHDL, LDLDIRECT in the last 72 hours. Thyroid function studies No results for input(s): TSH, T4TOTAL, T3FREE, THYROIDAB in the last 72 hours.  Invalid input(s): FREET3 Anemia work up No results for input(s): VITAMINB12, FOLATE, FERRITIN, TIBC, IRON, RETICCTPCT in the last 72 hours. Urinalysis    Component Value Date/Time   COLORURINE YELLOW 11/14/2017 1324   APPEARANCEUR CLEAR 11/14/2017 1324   LABSPEC 1.024 11/14/2017 1324   PHURINE 5.0 11/14/2017 1324   GLUCOSEU NEGATIVE 11/14/2017 1324   HGBUR MODERATE (A) 11/14/2017 1324   BILIRUBINUR NEGATIVE 11/14/2017 1324   KETONESUR 5 (A) 11/14/2017 1324   PROTEINUR 100 (A) 11/14/2017 1324   UROBILINOGEN 0.2 12/31/2014 1500   NITRITE NEGATIVE 11/14/2017 1324   LEUKOCYTESUR NEGATIVE 11/14/2017 1324   Sepsis Labs Invalid input(s): PROCALCITONIN,  WBC,  LACTICIDVEN Microbiology Recent Results (from the past 240 hour(s))  Culture, blood (routine x 2)     Status: None (Preliminary result)   Collection Time: 11/14/17 11:40 AM  Result Value Ref Range Status   Specimen Description BLOOD RIGHT HAND  Final   Special Requests   Final    BOTTLES DRAWN AEROBIC AND ANAEROBIC Blood Culture adequate volume   Culture   Final    NO GROWTH 3 DAYS Performed at [facility], [place] [address], [place], [place] [address]    Report Status PENDING  Incomplete  Culture, blood (routine x 2)     Status: None (Preliminary result)   Collection Time: 11/14/17 11:53 AM  Result Value Ref Range Status   Specimen  Description BLOOD LEFT HAND  Final   Special Requests   Final    BOTTLES DRAWN AEROBIC AND ANAEROBIC Blood Culture adequate volume   Culture   Final    NO GROWTH 3 DAYS Performed at [facility], [place] [address] [place], [place] [address]    Report Status PENDING  Incomplete     Time coordinating discharge: 35 minutes,   SIGNED:   Elmarie Shiley, MD  [facility] 11/17/2017, 10:15 AM Pager   If 7PM-7AM, please contact night-coverage [email or web address] Password [email or web address]

## 2017-11-17 NOTE — Evaluation (Signed)
Occupational Therapy Evaluation Patient Details Name: Craig [name]. MRN: [id number] DOB: [birth year] Today's Date: 11/17/2017    History of Present Illness Pt is a 84 y/o male admitted secondary to worsening SOB and weakness, thought to be secondary to CHF. Pt with recent admission secondary to bacteremia infection. PMH includes metastatic prostate cancer with indwelling foley catheter, HTN, a fib, dCHF, CVA, and CKD.    Clinical Impression   Pt uses a stedy at home for transfers and self feeds. He is otherwise dependent. Pt presents with generalized weakness, some confusion and slow processing, poor balance and requires total assist for all ADL. Pt's wife is agreeable to SNF for rehab, but will take pt home if he cannot get into one particular SNF. Recommending hoyer lift if pt returns home. Will follow acutely.    Follow Up Recommendations  SNF;Supervision/Assistance - 24 hour    Equipment Recommendations  (hoyer lift)    Recommendations for Other Services       Precautions / Restrictions Precautions Precautions: Fall Restrictions Weight Bearing Restrictions: No      Mobility Bed Mobility Overal bed mobility: Needs Assistance Bed Mobility: Rolling Rolling: Max assist Supine to Sit: +2 Max assist Sit to Supine: +2 Max assist         General bed mobility comments: pt assisting with UEs on rail only  Transfers                 General transfer comment: pt will require maximove, asked that a geomat be ordered, wife wants pt to finish breakfast before getting up    Balance                                           ADL either performed or assessed with clinical judgement   ADL                                         General ADL Comments: currently requiring total assist     Vision Patient Visual Report: No change from baseline       Perception     Praxis      Pertinent Vitals/Pain Pain Assessment:  Faces Faces Pain Scale: Hurts little more Pain Location: back  Pain Descriptors / Indicators: Aching Pain Intervention(s): Patient requesting pain meds-RN notified     Hand Dominance Right   Extremity/Trunk Assessment Upper Extremity Assessment Upper Extremity Assessment: Generalized weakness   Lower Extremity Assessment Lower Extremity Assessment: Defer to PT evaluation   Cervical / Trunk Assessment Cervical / Trunk Assessment: Kyphotic   Communication Communication Communication: No difficulties   Cognition Arousal/Alertness: Awake/alert Behavior During Therapy: WFL for tasks assessed/performed Overall Cognitive Status: Impaired/Different from baseline                                 General Comments: some confusion, slow to respond to commands   General Comments       Exercises     Shoulder Instructions      Home Living Family/patient expects to be discharged to:: Private residence Living Arrangements: Spouse/significant other Available Help at Discharge: Family;Available 24 hours/day Type of Home: House Home Access: Other (comment)(chair lift)     Home Layout: One  level;Other (Comment)(with basement)     Bathroom Shower/Tub: Occupational psychologist: Standard     Home Equipment: Environmental consultant - 2 wheels;Bedside commode;Wheelchair - manual;Hospital bed;Other (comment)(lift chair, stedy)          Prior Functioning/Environment Level of Independence: Needs assistance  Gait / Transfers Assistance Needed: Pt reports wife assisted with use of stedy to transfer from chair/bed to [facility].  ADL's / Homemaking Assistance Needed: pt self feeds with set up, otherwise dependent in ADL            OT Problem List: Decreased strength;Decreased activity tolerance;Impaired balance (sitting and/or standing);Decreased coordination;Decreased cognition;Pain      OT Treatment/Interventions: Self-care/ADL training;Therapeutic exercise;Balance  training;Patient/family education;Therapeutic activities    OT Goals(Current goals can be found in the care plan section) Acute Rehab OT Goals Patient Stated Goal: to go home if possible OT Goal Formulation: With patient Time For Goal Achievement: 12/01/17 Potential to Achieve Goals: Fair ADL Goals Pt Will Perform Eating: with set-up;sitting Pt Will Transfer to Toilet: with max assist;bedside commode(with stedy) Pt/caregiver will Perform Home Exercise Program: Increased strength;Both right and left upper extremity;With theraband;With minimal assist(level 2) Additional ADL Goal #1: Pt will perform bed mobility with moderate assistance in preparation for ADL. Additional ADL Goal #2: Pt will demonstrate fair sitting balance at EOB x 10 minutes in preparation for ADL.  OT Frequency: Min 2X/week   Barriers to D/C:            Co-evaluation              AM-PAC PT "6 Clicks" Daily Activity     Outcome Measure Help from another person eating meals?: Total Help from another person taking care of personal grooming?: Total Help from another person toileting, which includes using toliet, bedpan, or urinal?: Total Help from another person bathing (including washing, rinsing, drying)?: Total Help from another person to put on and taking off regular upper body clothing?: Total Help from another person to put on and taking off regular lower body clothing?: Total 6 Click Score: 6   End of Session Equipment Utilized During Treatment: Oxygen(1L) Nurse Communication: Need for lift equipment;Patient requests pain meds(needs chair cushion)  Activity Tolerance: Patient tolerated treatment well Patient left: in bed;with call bell/phone within reach;with family/visitor present;with nursing/sitter in room  OT Visit Diagnosis: Unsteadiness on feet (R26.81);Muscle weakness (generalized) (M62.81);Pain;Other symptoms and signs involving cognitive function                Time: 7948-0165 OT Time  Calculation (min): 26 min Charges:  OT General Charges $OT Visit: 1 Visit OT Evaluation $OT Eval Moderate Complexity: 1 Mod OT Treatments $Self Care/Home Management : 8-22 mins  Nestor Lewandowsky, OTR/L Acute Rehabilitation Services Pager: [phone] Office: [phone]  Malka So 11/17/2017, 9:54 AM

## 2017-11-17 NOTE — Progress Notes (Addendum)
[facility] for Warfarin Indication: atrial fibrillation  Allergies  Allergen Reactions  . Pravachol Other (See Comments)    Muscle weakness  . Zocor [Simvastatin - High Dose] Other (See Comments)    Muscle weakness    Patient Measurements: Height: 6' (182.9 cm) Weight: 189 lb 6 oz (85.9 kg) IBW/kg (Calculated) : 77.6  Vital Signs: Temp: 98.4 F (36.9 C) (09/26 0810) Temp Source: Oral (09/26 0810) BP: 165/82 (09/26 0810) Pulse Rate: 65 (09/26 0810)  Labs: Recent Labs    11/14/17 1141 11/15/17 0337 11/16/17 0259 11/17/17 0311  HGB 13.5 12.3*  --   --   HCT 43.7 40.0  --   --   PLT 199 212  --   --   LABPROT 21.5* 20.0* 19.5* 20.7*  INR 1.89 1.72 1.67 1.79  CREATININE 1.21 1.14 1.19 1.23    Estimated Creatinine Clearance: 43.8 mL/min (by C-G formula based on SCr of 1.23 mg/dL).   Medical History: Past Medical History:  Diagnosis Date  . Atrial fibrillation ([place])   . Chronic back pain   . GERD (gastroesophageal reflux disease)   . H/O hiatal hernia   . History of epistaxis 07/19/2002  . Hyperlipidemia   . Hypertension   . Hypertensive heart disease   . Hypokalemia   . ICH (intracerebral hemorrhage) ([place]) ~ 1999   after TPA/notes 09/27/2012  . Melanoma ([place])    "top of my head" (09/27/2012)  . Migraines    "migraines without headaches years ago" (09/27/2012)  . Osteoarthritis of both feet   . PAT (paroxysmal atrial tachycardia) ([place])   . Personal history of long-term (current) use of anticoagulants   . Pneumonia    "once; several years ago" (09/27/2012)  . Prostate cancer, primary, with metastasis from prostate to other site [facility])    on Lupron injections per GU  . S/P radiation therapy 02/13/14-02/27/14   SRS 5/5 spine completed 02/27/14  . S/P radiation therapy 05/27/14-05/31/14   right femur 20Gy/35fx  . Stroke [facility]) ~ 1999   ischemic / right cerebellar/posterior inferior cerebellar artery / right pos infarct     Assessment: 84 yo male admitted with acute on chronic diastolic CHF on warfarin chronically for atrial fibrillation. Pharmacy was consulted to restart warfarin therapy.  Home regimen: 5 mg M,Th,Sa and 2.5 mg Sun,T,W,F (last dose 9/22)  INR today increased from 1.67 to 1.79, still subtherapeutic. Of note, missed dose on 9/23 so likely impacting INR. Hgb 12.3, plt 212. No s/sx of bleeding. Remains on home enzalutamide, which can decrease warfarin effects.  Goal of Therapy:  INR 2-3   Plan:  Will give warfarin 6 mg po x 1 tonight F/u INR daily Discussed with cardiology - no need for bridge at this time Plan to discharge on home regimen starting tomorrow  Doylene Canard, PharmD Clinical Pharmacist  Pager: [phone] Phone: [phone] Please see AMION for all Pharmacists' Contact Phone Numbers 11/17/2017, 10:10 AM

## 2017-11-17 NOTE — Discharge Instructions (Signed)

## 2017-11-17 NOTE — Progress Notes (Signed)
SATURATION QUALIFICATIONS: (This note is used to comply with regulatory documentation for home oxygen)  Patient Saturations on Room Air at Rest 89%   Patient Saturations on 1Liters of oxygen while Ambulating = 94%  Patient is non-ambulatory.  Unable to do a walking saturation.

## 2017-11-18 DIAGNOSIS — G459 Transient cerebral ischemic attack, unspecified: Secondary | ICD-10-CM | POA: Diagnosis not present

## 2017-11-18 DIAGNOSIS — C7951 Secondary malignant neoplasm of bone: Secondary | ICD-10-CM | POA: Diagnosis not present

## 2017-11-18 DIAGNOSIS — I472 Ventricular tachycardia: Secondary | ICD-10-CM | POA: Diagnosis not present

## 2017-11-18 DIAGNOSIS — I482 Chronic atrial fibrillation, unspecified: Secondary | ICD-10-CM | POA: Diagnosis not present

## 2017-11-18 DIAGNOSIS — J690 Pneumonitis due to inhalation of food and vomit: Secondary | ICD-10-CM | POA: Diagnosis not present

## 2017-11-18 DIAGNOSIS — K5900 Constipation, unspecified: Secondary | ICD-10-CM | POA: Diagnosis not present

## 2017-11-18 DIAGNOSIS — R1110 Vomiting, unspecified: Secondary | ICD-10-CM | POA: Diagnosis not present

## 2017-11-18 DIAGNOSIS — N390 Urinary tract infection, site not specified: Secondary | ICD-10-CM | POA: Diagnosis not present

## 2017-11-18 DIAGNOSIS — N183 Chronic kidney disease, stage 3 (moderate): Secondary | ICD-10-CM | POA: Diagnosis not present

## 2017-11-18 DIAGNOSIS — L988 Other specified disorders of the skin and subcutaneous tissue: Secondary | ICD-10-CM | POA: Diagnosis not present

## 2017-11-18 DIAGNOSIS — Z452 Encounter for adjustment and management of vascular access device: Secondary | ICD-10-CM | POA: Diagnosis not present

## 2017-11-18 DIAGNOSIS — Z9981 Dependence on supplemental oxygen: Secondary | ICD-10-CM | POA: Diagnosis not present

## 2017-11-18 DIAGNOSIS — R110 Nausea: Secondary | ICD-10-CM | POA: Diagnosis not present

## 2017-11-18 DIAGNOSIS — I5033 Acute on chronic diastolic (congestive) heart failure: Secondary | ICD-10-CM | POA: Diagnosis not present

## 2017-11-18 DIAGNOSIS — Z8673 Personal history of transient ischemic attack (TIA), and cerebral infarction without residual deficits: Secondary | ICD-10-CM | POA: Diagnosis not present

## 2017-11-18 DIAGNOSIS — E785 Hyperlipidemia, unspecified: Secondary | ICD-10-CM | POA: Diagnosis not present

## 2017-11-18 DIAGNOSIS — J9601 Acute respiratory failure with hypoxia: Secondary | ICD-10-CM | POA: Diagnosis not present

## 2017-11-18 DIAGNOSIS — M2550 Pain in unspecified joint: Secondary | ICD-10-CM | POA: Diagnosis not present

## 2017-11-18 DIAGNOSIS — R7881 Bacteremia: Secondary | ICD-10-CM | POA: Diagnosis not present

## 2017-11-18 DIAGNOSIS — E7800 Pure hypercholesterolemia, unspecified: Secondary | ICD-10-CM | POA: Diagnosis not present

## 2017-11-18 DIAGNOSIS — Z7401 Bed confinement status: Secondary | ICD-10-CM | POA: Diagnosis not present

## 2017-11-18 DIAGNOSIS — I10 Essential (primary) hypertension: Secondary | ICD-10-CM | POA: Diagnosis not present

## 2017-11-18 DIAGNOSIS — Z466 Encounter for fitting and adjustment of urinary device: Secondary | ICD-10-CM | POA: Diagnosis not present

## 2017-11-18 DIAGNOSIS — R0902 Hypoxemia: Secondary | ICD-10-CM | POA: Diagnosis not present

## 2017-11-18 LAB — PROTIME-INR
INR: 1.9
PROTHROMBIN TIME: 21.7 s — AB (ref 11.4–15.2)

## 2017-11-18 MED ORDER — SENNA 8.6 MG PO TABS: 1.0000 | ORAL_TABLET | Freq: Two times a day (BID) | ORAL | 0 refills | Status: DC

## 2017-11-18 MED ORDER — POLYETHYLENE GLYCOL 3350 17 G PO PACK: 17.0000 g | PACK | Freq: Every day | ORAL | 0 refills | Status: AC

## 2017-11-18 MED ORDER — ACETAMINOPHEN 500 MG PO TABS: 1000.0000 mg | ORAL_TABLET | Freq: Every day | ORAL | Status: DC

## 2017-11-18 MED ORDER — WARFARIN SODIUM 2.5 MG PO TABS: 2.5000 mg | ORAL_TABLET | Freq: Once | ORAL | Status: DC

## 2017-11-18 MED ORDER — HEPARIN SOD (PORK) LOCK FLUSH 100 UNIT/ML IV SOLN
250.0000 [IU] | INTRAVENOUS | Status: AC | PRN
  Administered 2017-11-18: 250 [IU]

## 2017-11-18 NOTE — Progress Notes (Signed)
Patient will DC to: [facility] Anticipated DC date: 11/18/17 Family notified: Spouse at bedside Transport by: [facility] 12:30pm   Per MD patient ready for DC to [facility]. RN, patient, patient's family, and facility notified of DC. Discharge Summary sent to facility. RN given number for report [phone] Room 207). DC packet on chart. Ambulance transport requested for patient.   CSW signing off.  Cedric Fishman, LCSW Clinical Social Worker [phone]

## 2017-11-18 NOTE — Discharge Summary (Signed)
Physician Discharge Summary  Craig [name]. EPP:[id number] DOB: 1924-03-12 DOA: 11/14/2017  PCP: Mellody Dance, DO  Admit date: 11/14/2017 Discharge date: 11/18/2017  Admitted From: Home  Disposition: SNF  Recommendations for Outpatient Follow-up:  1. Follow up with PCP in 1-2 weeks 2. Please obtain BMP/CBC in one week 3. Monitor weight, urine out put. Close follow up with cardiologist  4. 1 L oxygen and needs to use oxygen at night.     Discharge Condition: stable.  CODE STATUS: Full code.  Diet recommendation: Heart Healthy   Brief/Interim Summary: Brief Narrative: 84 year old married male, retired Stage manager, PMH of HTN, HLD, PAF, stage III chronic kidney disease, chronic indwelling Foley catheter, chronic diastolic CHF, CVA, nonambulatory for couple of years, metastatic prostate cancer status post radiation therapy and Lupron injections, recent hospitalization 10/29/2017-11/03/2017 for sepsis due to StaphylococcusLugdunensisbacteremia, CAUTI, TEE without vegetations, ID consulted and recommended IV cefazolin for 6 weeks via PICC line to empirically treat for bacterial endocarditis (end date 12/13/2017) presented to [facility] ED on 11/14/2017 due to ongoing poor oral intake since recent hospital discharge, recurrent fluid buildup treated at home with extra doses of Lasix, but dyspnea got progressively worse, spouse called primary cardiologist office hoping that they would prescribe home oxygen but was advised to come to office for revisit, patient profoundly weak and hence presented to ED. Additionally reported mild chest tightness, mild abdominal pain, occasional dry cough which is at baseline, chronic dysphagia but compensates with slow chewing or swallowing. EMS noted oxygen saturations in the low 90s and placed on oxygen. Admitted for acute hypoxic respiratory failure secondary to acute on chronic diastolic CHF,? Aspiration pneumonia. Cardiology and ID were consulted at spouse's  request.   Assessment & Plan:   Principal Problem:   Acute on chronic diastolic CHF (congestive heart failure) (HCC) Active Problems:   Chronic atrial fibrillation (HCC)   Hypercholesterolemia   CKD (chronic kidney disease) stage 3, GFR 30-59 ml/min (HCC)   Hypercalcemia   Essential hypertension   History of stroke   Bacteremia, coagulase-negative staphylococcal   Aspiration pneumonia (HCC)   Acute respiratory failure with hypoxia (HCC)   NSVT (nonsustained ventricular tachycardia) (HCC)   Acute hypoxic respiratory failure.  Related to CHF exacerbation, ? Aspiration PNA.  On IV lasix.  Evaluation for home oxygen.   Acute on chronic Diastolic HF exacerbation;  TTE 11/02/2017: LVEF 55%, basal inferior hypokinesis, diastolic dysfunction could not be evaluated, mild to moderate aortic stenosis, moderate TR with severe pulmonary hypertension. Treated with V lasix. He was transition to oral lasix 40 Mg BID. Monitor weight.  Cardiology following.  Weight 192--188--189.  Weight up today, but he appears euvolemic. Has multiples blanket on him, that could affect weight.  Lungs clear. He is breathing well.  Discharge on lasix 40 mg BID>   Aspiration PNA ? Evaluated by speech regular diet.  ID consulted and following.  chest x ray stable right upper infiltrate.  On IV Cefazolin for staph Bacteremia; Follow ID recommendation. Per ID no need to change antibiotics.  Had coughing spell after drinking tea, patient and wife decline re-evaluation by speech. He would not want thickener. They understand risk. He has had that problem for a while and he has been able to compensate and cough. He continue to be afebrile.   Constipation;  Had large bowel movement yesterday.   Staph Lugdunensis Bacteremia;  On IV Cefazolin.  Last dose of cefazolin 10-22   Adult failure to thrive;  Multifactorial.    Chronic opacity  medial left lung base: Follow up out patient, if family wishes to  pursue this.  Chest x ray stable.  Incentive spirometry.   Metastatic prostate cancer: Continue home dose of Xtandi.  Chronic indwelling Foley catheter: Continue oxybutin.   History of CVA; on coumadin.    Stage III chronic kidney disease: Creatinine normal and at baseline. follow   A fib;  On coumadin. Carvedilol discontinue due to bradycardia/   Discharge Diagnoses:  Principal Problem:   Acute on chronic diastolic CHF (congestive heart failure) (HCC) Active Problems:   Chronic atrial fibrillation (HCC)   Hypercholesterolemia   CKD (chronic kidney disease) stage 3, GFR 30-59 ml/min (HCC)   Hypercalcemia   Essential hypertension   History of stroke   Bacteremia, coagulase-negative staphylococcal   Aspiration pneumonia (HCC)   Acute respiratory failure with hypoxia (HCC)   NSVT (nonsustained ventricular tachycardia) ([place])    Discharge Instructions  Discharge Instructions    Diet - low sodium heart healthy   Complete by:  As directed    Increase activity slowly   Complete by:  As directed      Allergies as of 11/18/2017      Reactions   Pravachol Other (See Comments)   Muscle weakness   Zocor [simvastatin - High Dose] Other (See Comments)   Muscle weakness      Medication List    TAKE these medications   acetaminophen 500 MG tablet Commonly known as:  TYLENOL Take 1,000 mg by mouth daily.   amLODipine 10 MG tablet Commonly known as:  NORVASC Take 1 tablet (10 mg total) by mouth daily.   atorvastatin 10 MG tablet Commonly known as:  LIPITOR Take 1 tablet (10 mg total) by mouth daily.   B-complex with vitamin C tablet Take 1 tablet by mouth daily.   calcium carbonate 500 MG chewable tablet Commonly known as:  TUMS - dosed in mg elemental calcium Chew 1 tablet by mouth daily.   ceFAZolin  IVPB Commonly known as:  ANCEF Inject 2 g into the vein every 8 (eight) hours. Indication:  Staphylococcal lugdunensis bacteremis/endocarditis Last Day  of Therapy:  Dec 13, 2017 Labs - Once weekly:  CBC/D and BMP, Labs - Every other week:  ESR and CRP   ezetimibe 10 MG tablet Commonly known as:  ZETIA Take 1 tablet (10 mg total) by mouth daily.   FISH OIL PO Take 1 capsule by mouth daily. ( Mega Red )   furosemide 40 MG tablet Commonly known as:  LASIX Take 1 tablet (40 mg total) by mouth 2 (two) times daily. What changed:  when to take this   losartan 100 MG tablet Commonly known as:  COZAAR Take 1 tablet (100 mg total) by mouth daily.   LUPRON DEPOT IM Inject 1 each into the muscle every 6 (six) months.   multivitamin per tablet Take 1 tablet by mouth daily. ( with B - Complex )   ondansetron 4 MG disintegrating tablet Commonly known as:  ZOFRAN-ODT Take 1 tablet (4 mg total) by mouth every 8 (eight) hours as needed for nausea or vomiting.   oxybutynin 5 MG tablet Commonly known as:  DITROPAN Take 5 mg by mouth daily as needed for bladder spasms.   polyethylene glycol packet Commonly known as:  MIRALAX / GLYCOLAX Take 17 g by mouth daily.   potassium chloride SA 20 MEQ tablet Commonly known as:  K-DUR,KLOR-CON Take 1 tablet (20 mEq total) by mouth daily.   PROBIOTIC ADVANCED PO  Take 1 capsule by mouth daily.   senna 8.6 MG Tabs tablet Commonly known as:  SENOKOT Take 1 tablet (8.6 mg total) by mouth 2 (two) times daily.   STOOL SOFTENER 100 MG capsule Generic drug:  docusate sodium Take 200 mg by mouth 2 (two) times daily.   warfarin 5 MG tablet Commonly known as:  COUMADIN Take as directed. If you are unsure how to take this medication, talk to your nurse or doctor. Original instructions:  Take 0.5-1 tablets (2.5-5 mg total) by mouth See admin instructions. Take one tablet (64m) on M TH SAT and half tablet (2.552m on SUN T W F or AS DIRECTED BY THE COUMADIN CLINIC   XGEVA  Inject into the skin every 30 (thirty) days.   XTANDI 40 MG capsule Generic drug:  enzalutamide TAKE 1 CAPSULE (40 MG) BY MOUTH  ONCE DAILY What changed:  See the new instructions.       Contact information for follow-up providers    [facility]-Home Follow up.   Specialty:  [place]:  Home Health RN-agency will call to arrange initial visit Contact information: [address] [place] [phone]          Contact information for after-discharge care    Destination    [facility] Preferred SNF .   Service:  Skilled Nursing Contact information: [phone]               Allergies  Allergen Reactions  . Pravachol Other (See Comments)    Muscle weakness  . Zocor [Simvastatin - High Dose] Other (See Comments)    Muscle weakness    Consultations: Cardiology   Procedures/Studies: Dg Abd 1 View  Result Date: 11/17/2017 CLINICAL DATA:  Constipation. History of prostate cancer with bone metastases. EXAM: ABDOMEN - 1 VIEW COMPARISON:  Bone scan 04/18/2017 and CT 02/09/2014 FINDINGS: Bowel gas pattern demonstrates moderate fecal retention over the right colon and transverse colon. Mild fecal retention over the rectum. There are several air-filled nondilated small bowel loops over the central abdomen. Degenerative change of the spine and mild degenerate change of the hips. Known bone island over the left femoral head. Likely enchondroma over the left femoral trochanteric region unchanged. Mild sclerosis over the left superior acetabular region with patchy sclerosis throughout the pelvic bones compatible with known bone metastases. IMPRESSION: Nonobstructive bowel gas pattern with moderate fecal retention within the colon as described. Patchy sclerosis over the pelvic bones compatible with known metastases. Mild degenerate change of the hips and degenerative change of the spine Electronically Signed   By: DaMarin Olp.D.   On: 11/17/2017 19:31   Dg Chest Port 1 View  Result  Date: 11/16/2017 CLINICAL DATA:  Aspiration pneumonia EXAM: PORTABLE CHEST 1 VIEW COMPARISON:  November 14, 2017 FINDINGS: The mediastinal contour is stable. Heart size is enlarged. Right central venous line is identified distal tip probably in the subclavian/ jugular vein junction. Patchy opacity of the right mid lung is unchanged. Dense consolidation of the left lung base is unchanged. IMPRESSION: No significant interval change in the patchy consolidation right mid lung and dense consolidation of left lung base compared prior exam. Right central venous line is identified distal tip probably in the subclavian/jugular vein junction. Electronically Signed   By: WeAbelardo Diesel.D.   On: 11/16/2017 09:53   Dg Chest Port 1 View  Result Date: 11/14/2017  CLINICAL DATA:  Pt [facility] home with [facility] from home with c/o of sob- pt has PICC line in place for treatment of known Staph infection. Pt denies any pain. At home ems noted sats to be in low 90's. Pt placed on 2L [place]. EXAM: PORTABLE CHEST 1 VIEW COMPARISON:  None. FINDINGS: Patient has RIGHT-sided PICC line, tip overlying the level of superior vena cava. The heart is enlarged. There is dense atherosclerotic calcification of the thoracic aorta. Persistent dense opacity at the LEFT lung base obscures the hemidiaphragm and is consistent with atelectasis, consolidation, or scarring. There is faint patchy opacity in the LATERAL RIGHT UPPER lobe, slightly increased compared to prior studies. IMPRESSION: 1. Chronic opacity in the MEDIAL LEFT lung base. Findings are consistent with atelectasis, scarring, or consolidation. 2. Question developing infiltrate in the RIGHT UPPER lobe. 3. Cardiomegaly. 4.  Aortic atherosclerosis.  (ICD10-I70.0) Electronically Signed   By: Nolon Nations M.D.   On: 11/14/2017 12:07   Dg Chest Port 1 View  Result Date: 11/02/2017 CLINICAL DATA:  PICC placement EXAM: PORTABLE CHEST 1 VIEW COMPARISON:  10/29/2017, 07/17/2014 FINDINGS: Right  upper extremity catheter tip overlies the SVC. Cardiomegaly with trace right pleural effusion and airspace disease at the left base. Aortic atherosclerosis. No pneumothorax. IMPRESSION: 1. Right upper extremity catheter tip overlies the SVC. 2. Cardiomegaly 3. Probable tiny right effusion. Airspace disease at the left base which may reflect atelectasis or an infiltrate. Electronically Signed   By: Donavan Foil M.D.   On: 11/02/2017 18:20   Dg Chest Port 1 View  Result Date: 10/29/2017 CLINICAL DATA:  Altered mental status and fever. Ex-smoker. EXAM: PORTABLE CHEST 1 VIEW COMPARISON:  07/17/2014. FINDINGS: Stable enlarged cardiac silhouette and tortuous and calcified thoracic aorta. The interstitial markings remain mildly prominent. Stable small amount of linear scarring in the right mid lung zone. Thoracic spine degenerative changes. IMPRESSION: Stable cardiomegaly and mild chronic interstitial lung disease. No acute abnormality. Electronically Signed   By: Claudie Revering M.D.   On: 10/29/2017 19:46   [place] Ekg Site Rite  Result Date: 11/02/2017 If Site Rite image not attached, placement could not be confirmed due to current cardiac rhythm.    Subjective: Denies dyspnea.   Discharge Exam: Vitals:   11/17/17 1946 11/18/17 0618  BP: 134/62 (!) 160/95  Pulse: (!) 52 61  Resp: 18 18  Temp: 98.4 F (36.9 C) 98.2 F (36.8 C)  SpO2: 99% 94%   Vitals:   11/17/17 1233 11/17/17 1709 11/17/17 1946 11/18/17 0618  BP: 129/62 (!) 168/62 134/62 (!) 160/95  Pulse: (!) 45 (!) 47 (!) 52 61  Resp: 20 16 18 18   Temp: 99.1 F (37.3 C) 98.3 F (36.8 C) 98.4 F (36.9 C) 98.2 F (36.8 C)  TempSrc: Oral Oral Oral Oral  SpO2: 96% 98% 99% 94%  Weight:    87.1 kg  Height:        General: NAD Cardiovascular: S 1, S 2 RRR Respiratory: CTA Abdominal: Soft, nt, nd Extremities: no edema    The results of significant diagnostics from this hospitalization (including imaging, microbiology, ancillary and  laboratory) are listed below for reference.     Microbiology: Recent Results (from the past 240 hour(s))  Culture, blood (routine x 2)     Status: None (Preliminary result)   Collection Time: 11/14/17 11:40 AM  Result Value Ref Range Status   Specimen Description BLOOD RIGHT HAND  Final   Special Requests   Final  BOTTLES DRAWN AEROBIC AND ANAEROBIC Blood Culture adequate volume   Culture   Final    NO GROWTH 4 DAYS Performed at [facility], [place] [address], [place], [place] [address]    Report Status PENDING  Incomplete  Culture, blood (routine x 2)     Status: None (Preliminary result)   Collection Time: 11/14/17 11:53 AM  Result Value Ref Range Status   Specimen Description BLOOD LEFT HAND  Final   Special Requests   Final    BOTTLES DRAWN AEROBIC AND ANAEROBIC Blood Culture adequate volume   Culture   Final    NO GROWTH 4 DAYS Performed at [facility], [place] [address], [place], [place] [address]    Report Status PENDING  Incomplete     Labs: BNP (last 3 results) Recent Labs    11/14/17 1141  BNP 601.0*   Basic Metabolic Panel: Recent Labs  Lab 11/14/17 1141 11/15/17 0337 11/16/17 0259 11/17/17 0311  NA 141 140 138 137  K 3.8 3.7 4.0 3.5  CL 97* 99 97* 97*  CO2 31 31 33* 30  GLUCOSE 107* 115* 96 106*  BUN 24* 22 23 25*  CREATININE 1.21 1.14 1.19 1.23  CALCIUM 10.9* 10.5* 9.5 9.6  MG  --  1.9  --  1.9   Liver Function Tests: Recent Labs  Lab 11/14/17 1141  AST 18  ALT <5  ALKPHOS 62  BILITOT 0.8  PROT 7.1  ALBUMIN 3.0*   No results for input(s): LIPASE, AMYLASE in the last 168 hours. No results for input(s): AMMONIA in the last 168 hours. CBC: Recent Labs  Lab 11/14/17 1141 11/15/17 0337  WBC 7.8 9.7  NEUTROABS 5.6 7.4  HGB 13.5 12.3*  HCT 43.7 40.0  MCV 101.2* 101.3*  PLT 199 212   Cardiac Enzymes: No results for input(s): CKTOTAL, CKMB, CKMBINDEX, TROPONINI in the last 168 hours. BNP: Invalid input(s): POCBNP CBG: No  results for input(s): GLUCAP in the last 168 hours. D-Dimer No results for input(s): DDIMER in the last 72 hours. Hgb A1c No results for input(s): HGBA1C in the last 72 hours. Lipid Profile No results for input(s): CHOL, HDL, LDLCALC, TRIG, CHOLHDL, LDLDIRECT in the last 72 hours. Thyroid function studies No results for input(s): TSH, T4TOTAL, T3FREE, THYROIDAB in the last 72 hours.  Invalid input(s): FREET3 Anemia work up No results for input(s): VITAMINB12, FOLATE, FERRITIN, TIBC, IRON, RETICCTPCT in the last 72 hours. Urinalysis    Component Value Date/Time   COLORURINE YELLOW 11/14/2017 1324   APPEARANCEUR CLEAR 11/14/2017 1324   LABSPEC 1.024 11/14/2017 1324   PHURINE 5.0 11/14/2017 1324   GLUCOSEU NEGATIVE 11/14/2017 1324   HGBUR MODERATE (A) 11/14/2017 1324   BILIRUBINUR NEGATIVE 11/14/2017 1324   KETONESUR 5 (A) 11/14/2017 1324   PROTEINUR 100 (A) 11/14/2017 1324   UROBILINOGEN 0.2 12/31/2014 1500   NITRITE NEGATIVE 11/14/2017 1324   LEUKOCYTESUR NEGATIVE 11/14/2017 1324   Sepsis Labs Invalid input(s): PROCALCITONIN,  WBC,  LACTICIDVEN Microbiology Recent Results (from the past 240 hour(s))  Culture, blood (routine x 2)     Status: None (Preliminary result)   Collection Time: 11/14/17 11:40 AM  Result Value Ref Range Status   Specimen Description BLOOD RIGHT HAND  Final   Special Requests   Final    BOTTLES DRAWN AEROBIC AND ANAEROBIC Blood Culture adequate volume   Culture   Final    NO GROWTH 4 DAYS Performed at [facility], [place]  [address] [place], [place] [address]    Report Status PENDING  Incomplete  Culture, blood (routine x 2)     Status: None (Preliminary result)   Collection Time: 11/14/17 11:53 AM  Result Value Ref Range Status   Specimen Description BLOOD LEFT HAND  Final   Special Requests   Final    BOTTLES DRAWN AEROBIC AND ANAEROBIC Blood Culture adequate volume   Culture   Final    NO GROWTH 4 DAYS Performed at [place], [place] [address] [place], [place] [address]    Report Status PENDING  Incomplete     Time coordinating discharge: 35 minutes,   SIGNED:   Elmarie Shiley, MD  [facility] 11/18/2017, 11:00 AM Pager   If 7PM-7AM, please contact night-coverage [email or web address] Password [email or web address]

## 2017-11-18 NOTE — Progress Notes (Signed)
[place] for Warfarin Indication: atrial fibrillation  Allergies  Allergen Reactions  . Pravachol Other (See Comments)    Muscle weakness  . Zocor [Simvastatin - High Dose] Other (See Comments)    Muscle weakness    Patient Measurements: Height: 6' (182.9 cm) Weight: 192 lb (87.1 kg) IBW/kg (Calculated) : 77.6  Vital Signs: Temp: 98.2 F (36.8 C) (09/27 0618) Temp Source: Oral (09/27 0618) BP: 160/95 (09/27 0618) Pulse Rate: 61 (09/27 0618)  Labs: Recent Labs    11/16/17 0259 11/17/17 0311 11/18/17 0355  LABPROT 19.5* 20.7* 21.7*  INR 1.67 1.79 1.90  CREATININE 1.19 1.23  --     Estimated Creatinine Clearance: 43.8 mL/min (by C-G formula based on SCr of 1.23 mg/dL).   Medical History: Past Medical History:  Diagnosis Date  . Atrial fibrillation ([place])   . Chronic back pain   . GERD (gastroesophageal reflux disease)   . H/O hiatal hernia   . History of epistaxis 07/19/2002  . Hyperlipidemia   . Hypertension   . Hypertensive heart disease   . Hypokalemia   . ICH (intracerebral hemorrhage) ([place]) ~ 1999   after TPA/notes 09/27/2012  . Melanoma ([place])    "top of my head" (09/27/2012)  . Migraines    "migraines without headaches years ago" (09/27/2012)  . Osteoarthritis of both feet   . PAT (paroxysmal atrial tachycardia) ([place])   . Personal history of long-term (current) use of anticoagulants   . Pneumonia    "once; several years ago" (09/27/2012)  . Prostate cancer, primary, with metastasis from prostate to other site [facility])    on Lupron injections per GU  . S/P radiation therapy 02/13/14-02/27/14   SRS 5/5 spine completed 02/27/14  . S/P radiation therapy 05/27/14-05/31/14   right femur 20Gy/31fx  . Stroke [facility]) ~ 1999   ischemic / right cerebellar/posterior inferior cerebellar artery / right pos infarct    Assessment: 84 yo male admitted with acute on chronic diastolic CHF on warfarin chronically for atrial fibrillation. Pharmacy  was consulted to restart warfarin therapy.  Home regimen: 5 mg M,Th,Sa and 2.5 mg Sun,T,W,F (last dose 9/22)  INR today increased from 1.79 to 1.9, slightly subtherapeutic. Of note, missed dose on 9/23 so likely impacting INR. Hgb 12.3, plt 212 on last check. No s/sx of bleeding. Remains on home enzalutamide, which can decrease warfarin effects.  Goal of Therapy:  INR 2-3   Plan:  Will give warfarin 2.5 mg po x 1 tonight (as per home regimen) F/u INR daily Plan to discharge on home regimen starting tomorrow  Doylene Canard, PharmD Clinical Pharmacist  Pager: [phone] Phone: [phone] Please see AMION for all Pharmacists' Contact Phone Numbers 11/18/2017, 10:36 AM

## 2017-11-18 NOTE — Progress Notes (Signed)
Report called and given at RN at [facility] Neta Mends RN 1:18 PM 11-18-2017

## 2017-11-18 NOTE — Progress Notes (Signed)
Insurance approval received for patient to discharge to [facility] today.   Percell Locus Janda Cargo LCSW [phone]

## 2017-11-18 NOTE — Care Management Note (Signed)
Case Management Note  Patient Details  Name: Craig [name]. MRN: [id number] Date of Birth: Jun 25, 1924  Subjective/Objective:                    Action/Plan:  DC to SNF as facilitated by CSW.   Expected Discharge Date:  11/18/17               Expected Discharge Plan:  [place]  In-House Referral:  NA  Discharge planning Services  CM Consult  Post Acute Care Choice:  Home Health, Resumption of Svcs/PTA Provider Choice offered to:  Spouse  DME Arranged:    DME Agency:     HH Arranged:    [place] Agency:     Status of Service:  Completed, signed off  If discussed at [employer] of Stay Meetings, dates discussed:    Additional Comments:  Carles Collet, RN 11/18/2017, 11:31 AM

## 2017-11-18 NOTE — Clinical Social Work Placement (Signed)
   CLINICAL SOCIAL WORK PLACEMENT  NOTE  Date:  11/18/2017  Patient Details  Name: Craig [name]. MRN: [id number] Date of Birth: [birth year]  Clinical Social Work is seeking post-discharge placement for this patient at the [place] level of care (*CSW will initial, date and re-position this form in  chart as items are completed):  Yes   Patient/family provided with [facility] Work Department's list of facilities offering this level of care within the geographic area requested by the patient (or if unable, by the patient's family).  Yes   Patient/family informed of their freedom to choose among providers that offer the needed level of care, that participate in Medicare, Medicaid or managed care program needed by the patient, have an available bed and are willing to accept the patient.  Yes   Patient/family informed of [facility]'s ownership interest in [facility] and [facility], as well as of the fact that they are under no obligation to receive care at these facilities.  PASRR submitted to EDS on 11/18/17     PASRR number received on 11/18/17     Existing PASRR number confirmed on       FL2 transmitted to all facilities in geographic area requested by pt/family on 11/18/17     FL2 transmitted to all facilities within larger geographic area on       Patient informed that his/her managed care company has contracts with or will negotiate with certain facilities, including the following:        Yes   Patient/family informed of bed offers received.  Patient chooses bed at [place], [place]     Physician recommends and patient chooses bed at      Patient to be transferred to [facility] on 11/18/17.  Patient to be transferred to facility by [facility]     Patient family notified on 11/18/17 of transfer.  Name of family member notified:  Spouse     PHYSICIAN       Additional Comment:     _______________________________________________ Benard Halsted, [place] 11/18/2017, 11:49 AM

## 2017-11-19 LAB — CULTURE, BLOOD (ROUTINE X 2)
Culture: NO GROWTH
Culture: NO GROWTH
SPECIAL REQUESTS: ADEQUATE
Special Requests: ADEQUATE

## 2017-11-22 DIAGNOSIS — L988 Other specified disorders of the skin and subcutaneous tissue: Secondary | ICD-10-CM | POA: Diagnosis not present

## 2017-11-24 ENCOUNTER — Telehealth: Payer: Self-pay | Admitting: Oncology

## 2017-11-24 ENCOUNTER — Telehealth: Payer: Self-pay | Admitting: *Deleted

## 2017-11-24 NOTE — Telephone Encounter (Signed)
I called and left a message for [facility] regarding new appointments. Labs at 11:15 and Dr. Alen Blew at 11:45 am on October, 29th. Instructed her to call if she had any questions or concerns.

## 2017-11-24 NOTE — Telephone Encounter (Signed)
Pt r/s per FS.

## 2017-11-24 NOTE — Telephone Encounter (Signed)
Received voice mail message from wife stating,"Craig [name] is at [facility] with congestive heart failure. I need to cancel his appointments for next week, 11/29/17. [name] has an appointment on 10/29 at [facility] at 10:00 am. As a favor, Dr. Alen Blew let's [place] do our appointments the same day. Does he have any openings on 10/29? We would need to do lab and visit. Return number is [phone]. Thank You."

## 2017-11-24 NOTE — Telephone Encounter (Signed)
I can see him at 11:45 after labs on 10/29. Please send a message to scheduling.

## 2017-11-29 ENCOUNTER — Inpatient Hospital Stay: Payer: Medicare Other

## 2017-11-29 ENCOUNTER — Inpatient Hospital Stay: Payer: Medicare Other | Admitting: Oncology

## 2017-11-30 ENCOUNTER — Telehealth: Payer: Self-pay | Admitting: *Deleted

## 2017-11-30 NOTE — Telephone Encounter (Signed)
Called Mrs. [name] because the pt is overdue for an INR and she stated the pt was discharged from the hospital on 11/18/17 and went to [facility]. [facility] is currently managing the pt and she will call to update [place] once he is discharged.

## 2017-12-05 ENCOUNTER — Telehealth: Payer: Self-pay | Admitting: Interventional Cardiology

## 2017-12-05 MED ORDER — FUROSEMIDE 40 MG PO TABS: 40.0000 mg | ORAL_TABLET | Freq: Two times a day (BID) | ORAL | 8 refills | Status: DC

## 2017-12-05 NOTE — Telephone Encounter (Signed)
New message:       .Pt c/o medication issue:  1. Name of Medication: furosemide (LASIX) 40 MG tablet  2. How are you currently taking this medication (dosage and times per day)? Take 1 tablet (40 mg total) by mouth 2 (two) times daily.  3. Are you having a reaction (difficulty breathing--STAT)? NO  4. What is your medication issue? Pt's wife states they need a new prescription sent over for the pt due to a change in the dosage.

## 2017-12-05 NOTE — Telephone Encounter (Signed)
Pt's medication was sent to pt's pharmacy as requested. Confirmation received.  °

## 2017-12-13 ENCOUNTER — Other Ambulatory Visit: Payer: Self-pay | Admitting: Oncology

## 2017-12-13 DIAGNOSIS — C7951 Secondary malignant neoplasm of bone: Secondary | ICD-10-CM

## 2017-12-13 DIAGNOSIS — C61 Malignant neoplasm of prostate: Secondary | ICD-10-CM

## 2017-12-14 ENCOUNTER — Ambulatory Visit: Payer: Medicare Other | Admitting: Family Medicine

## 2017-12-16 DIAGNOSIS — I480 Paroxysmal atrial fibrillation: Secondary | ICD-10-CM | POA: Diagnosis not present

## 2017-12-16 DIAGNOSIS — Z7901 Long term (current) use of anticoagulants: Secondary | ICD-10-CM | POA: Diagnosis not present

## 2017-12-16 DIAGNOSIS — I130 Hypertensive heart and chronic kidney disease with heart failure and stage 1 through stage 4 chronic kidney disease, or unspecified chronic kidney disease: Secondary | ICD-10-CM | POA: Diagnosis not present

## 2017-12-16 DIAGNOSIS — B9689 Other specified bacterial agents as the cause of diseases classified elsewhere: Secondary | ICD-10-CM | POA: Diagnosis not present

## 2017-12-16 DIAGNOSIS — I5032 Chronic diastolic (congestive) heart failure: Secondary | ICD-10-CM | POA: Diagnosis not present

## 2017-12-16 DIAGNOSIS — R339 Retention of urine, unspecified: Secondary | ICD-10-CM | POA: Diagnosis not present

## 2017-12-16 DIAGNOSIS — Z452 Encounter for adjustment and management of vascular access device: Secondary | ICD-10-CM | POA: Diagnosis not present

## 2017-12-16 DIAGNOSIS — N390 Urinary tract infection, site not specified: Secondary | ICD-10-CM | POA: Diagnosis not present

## 2017-12-16 DIAGNOSIS — N183 Chronic kidney disease, stage 3 (moderate): Secondary | ICD-10-CM | POA: Diagnosis not present

## 2017-12-16 DIAGNOSIS — Z466 Encounter for fitting and adjustment of urinary device: Secondary | ICD-10-CM | POA: Diagnosis not present

## 2017-12-16 DIAGNOSIS — Z5181 Encounter for therapeutic drug level monitoring: Secondary | ICD-10-CM | POA: Diagnosis not present

## 2017-12-16 DIAGNOSIS — I38 Endocarditis, valve unspecified: Secondary | ICD-10-CM | POA: Diagnosis not present

## 2017-12-16 DIAGNOSIS — E7800 Pure hypercholesterolemia, unspecified: Secondary | ICD-10-CM | POA: Diagnosis not present

## 2017-12-16 DIAGNOSIS — Z8673 Personal history of transient ischemic attack (TIA), and cerebral infarction without residual deficits: Secondary | ICD-10-CM | POA: Diagnosis not present

## 2017-12-16 DIAGNOSIS — I350 Nonrheumatic aortic (valve) stenosis: Secondary | ICD-10-CM | POA: Diagnosis not present

## 2017-12-16 DIAGNOSIS — C7951 Secondary malignant neoplasm of bone: Secondary | ICD-10-CM | POA: Diagnosis not present

## 2017-12-16 DIAGNOSIS — I482 Chronic atrial fibrillation, unspecified: Secondary | ICD-10-CM | POA: Diagnosis not present

## 2017-12-16 DIAGNOSIS — I472 Ventricular tachycardia: Secondary | ICD-10-CM | POA: Diagnosis not present

## 2017-12-16 DIAGNOSIS — T83511A Infection and inflammatory reaction due to indwelling urethral catheter, initial encounter: Secondary | ICD-10-CM | POA: Diagnosis not present

## 2017-12-16 DIAGNOSIS — I5033 Acute on chronic diastolic (congestive) heart failure: Secondary | ICD-10-CM | POA: Diagnosis not present

## 2017-12-16 DIAGNOSIS — I272 Pulmonary hypertension, unspecified: Secondary | ICD-10-CM | POA: Diagnosis not present

## 2017-12-17 DIAGNOSIS — I5032 Chronic diastolic (congestive) heart failure: Secondary | ICD-10-CM | POA: Diagnosis not present

## 2017-12-17 DIAGNOSIS — Z7901 Long term (current) use of anticoagulants: Secondary | ICD-10-CM | POA: Diagnosis not present

## 2017-12-17 DIAGNOSIS — I480 Paroxysmal atrial fibrillation: Secondary | ICD-10-CM | POA: Diagnosis not present

## 2017-12-17 DIAGNOSIS — I38 Endocarditis, valve unspecified: Secondary | ICD-10-CM | POA: Diagnosis not present

## 2017-12-17 DIAGNOSIS — N183 Chronic kidney disease, stage 3 (moderate): Secondary | ICD-10-CM | POA: Diagnosis not present

## 2017-12-17 DIAGNOSIS — T83511A Infection and inflammatory reaction due to indwelling urethral catheter, initial encounter: Secondary | ICD-10-CM | POA: Diagnosis not present

## 2017-12-17 DIAGNOSIS — Z452 Encounter for adjustment and management of vascular access device: Secondary | ICD-10-CM | POA: Diagnosis not present

## 2017-12-17 DIAGNOSIS — N390 Urinary tract infection, site not specified: Secondary | ICD-10-CM | POA: Diagnosis not present

## 2017-12-17 DIAGNOSIS — Z5181 Encounter for therapeutic drug level monitoring: Secondary | ICD-10-CM | POA: Diagnosis not present

## 2017-12-17 DIAGNOSIS — Z8673 Personal history of transient ischemic attack (TIA), and cerebral infarction without residual deficits: Secondary | ICD-10-CM | POA: Diagnosis not present

## 2017-12-17 DIAGNOSIS — C7951 Secondary malignant neoplasm of bone: Secondary | ICD-10-CM | POA: Diagnosis not present

## 2017-12-17 DIAGNOSIS — B9689 Other specified bacterial agents as the cause of diseases classified elsewhere: Secondary | ICD-10-CM | POA: Diagnosis not present

## 2017-12-17 DIAGNOSIS — I130 Hypertensive heart and chronic kidney disease with heart failure and stage 1 through stage 4 chronic kidney disease, or unspecified chronic kidney disease: Secondary | ICD-10-CM | POA: Diagnosis not present

## 2017-12-19 ENCOUNTER — Telehealth: Payer: Self-pay | Admitting: Interventional Cardiology

## 2017-12-19 ENCOUNTER — Telehealth: Payer: Self-pay | Admitting: Oncology

## 2017-12-19 ENCOUNTER — Other Ambulatory Visit: Payer: Self-pay

## 2017-12-19 ENCOUNTER — Ambulatory Visit: Payer: Self-pay | Admitting: Cardiovascular Disease

## 2017-12-19 ENCOUNTER — Telehealth: Payer: Self-pay

## 2017-12-19 ENCOUNTER — Telehealth: Payer: Self-pay | Admitting: *Deleted

## 2017-12-19 DIAGNOSIS — I482 Chronic atrial fibrillation, unspecified: Secondary | ICD-10-CM

## 2017-12-19 DIAGNOSIS — Z5181 Encounter for therapeutic drug level monitoring: Secondary | ICD-10-CM

## 2017-12-19 LAB — POCT INR: INR: 1.6 — AB (ref 2.0–3.0)

## 2017-12-19 MED ORDER — POTASSIUM CHLORIDE CRYS ER 20 MEQ PO TBCR: 20.0000 meq | EXTENDED_RELEASE_TABLET | Freq: Every day | ORAL | 1 refills | Status: DC

## 2017-12-19 MED ORDER — FUROSEMIDE 40 MG PO TABS: 40.0000 mg | ORAL_TABLET | Freq: Two times a day (BID) | ORAL | 1 refills | Status: DC

## 2017-12-19 NOTE — Patient Outreach (Signed)
[facility]) Care Management  12/19/2017  Craig [name]. 1924-05-01 [id number]     EMMI-General Discharge RED ON EMMI ALERT Day # 12/15/17 Date: 4 Red Alert Reason: " Sad/hopeless/anxious/empty? Yes"   Outreach attempt #1 to patient. Spoke with spouse(DPR on file). Spouse has ben answering some of automated calls. Reviewed and addressed red alert with spouse. She reports that she responded that way as she feels that patient has "resigned" since he returned home. She shares that patient has been through a lot over the past two months being hospitalized and then sent to rehab. She is hopeful that ow that patient is at home and recovering he will be able to regain strength and return to baseline. She is also hopeful that once he starts feeling better he will do some of the things he enjoyed doing like watching the news and reading newspaper. She is aware to seek medical attention for any worsening and/or unresolved issues. She denies needing any further assistance at this time. Patient has completed post discharge automated calls.      Plan: RN CM will close case at this time as no further interventions needed.    Craig Montgomery, RN,BSN,CCM [facility] Management Telephonic Care Management Coordinator Direct Phone: [phone] Toll Free: [phone] Fax: [phone]

## 2017-12-19 NOTE — Telephone Encounter (Signed)
Patients wife called to cancel °

## 2017-12-19 NOTE — Telephone Encounter (Signed)
New message  Nursing home doctor changed dosage of lasik and he needs a new prescription for the increase and a prescription for the potassium.  Wife  Would like Craig [name] to call her so that she can discuss the changes and she needs a red vest order for [facility] - because they can not get him on scale to weigh him    *STAT* If patient is at the pharmacy, call can be transferred to refill team.   1. Which medications need to be refilled? (please list name of each medication and dose if known) lasik  And potassium   2. Which pharmacy/location (including street and city if local pharmacy) is medication to be sent to? [facility]   3. Do they need a 30 day or 90 day supply? 90 day supply

## 2017-12-19 NOTE — Telephone Encounter (Signed)
Spoke with Wells Guiles. She will check with PCP to see if they will manage.

## 2017-12-19 NOTE — Telephone Encounter (Signed)
New message   Per Wells Guiles, patient needs skilled nursing orders. 1 time a week for 1 week and 2 times a week for 3 weeks and 1 time a week for 1 week. 2 Prn visits as needed  for [facility] Management. Please call to discuss.

## 2017-12-19 NOTE — Telephone Encounter (Signed)
I recommend to pull the picc line and no additional labs needed.

## 2017-12-19 NOTE — Patient Instructions (Signed)
Description   Spoke with pt's wife and instructed to have pt take 5mg  today then start taking 1/2 tablet daily except 1 tablet on Mondays.  (Previous dose before Rehab 2.5mg  qd except 5mg  Mon, Thurs, Sat) Recheck in 1 week-Self-tester. Call with any concerns new medication or if scheduled for any procedures [phone]

## 2017-12-19 NOTE — Telephone Encounter (Signed)
Patient wife called to inform that he was done with his IV medication 12/13/17 but still has his PICC. She was under the impression that he was to have an additional lab draw prior to D/C.  Craig [name] at [facility] and found the patient has been back in the hospital (CHF) since starting IV but did in fact stop 12/13/17 and he does still have PICC. They are set to draw labs Wednesday for Oncologist (CBC, CMP and PSA) and would be glad to draw and additional labs (Sed Rate/ CRP) and fax results if we would like. Advised will speak with [facility] to see if we need any labs and if it is ok to D/C the PICC as they do not have an order to do so.  Wells Guiles [phone]

## 2017-12-19 NOTE — Telephone Encounter (Signed)
Per [facility] response called and advised Wells Guiles to D/C the PICC and we do not need any new labs.

## 2017-12-19 NOTE — Telephone Encounter (Signed)
Orders faxed for PSA,CBC,CMP to [facility] attention [facility] [phone], requesting confirmation of receipt. Contacted patient spouse, Craig [name], and made aware of faxed orders. She understands to call back with any other questions or concerns.

## 2017-12-19 NOTE — Telephone Encounter (Signed)
Returned call to patient's wife (DPR on file). She states that she needs refills for the patient's potassium and lasix. Refills sent in to preferred pharmacy.   Wife also states that the patient is unable to stand and that he cannot weigh himself daily. She states that [facility] recommended Dr. Irish Lack give orders for a Red Vest to assess volume status. Made wife aware that is something typically managed by our CHF MDs. Made wife aware that I will discuss with Dr. Irish Lack and call her back and let her know. Wife verbal;ized understanding and thanked me for the call.

## 2017-12-19 NOTE — Telephone Encounter (Signed)
-----   Message from Wyatt Portela, MD sent at 12/19/2017 10:02 AM EDT ----- Regarding: RE: labs/appt That will be great if they can do that. Please fax an order to do these labs.  Thanks.  ----- Message ----- From: Scot Dock, RN Sent: 12/19/2017   9:45 AM EDT To: Wyatt Portela, MD Subject: labs/appt                                      Spouse called and stated that pt is too weak after rehab stay at [facility] and she is unable to bring in to appt tomorrow. [facility] RN is visiting 3x/week and she would like to know if you would like [facility] RN to draw labs at house - PSA,CMP,CBC possibly - and then fax to you. Please advise

## 2017-12-20 ENCOUNTER — Inpatient Hospital Stay: Payer: Medicare Other | Admitting: Oncology

## 2017-12-20 ENCOUNTER — Inpatient Hospital Stay: Payer: Medicare Other

## 2017-12-20 ENCOUNTER — Telehealth: Payer: Self-pay

## 2017-12-20 DIAGNOSIS — R339 Retention of urine, unspecified: Secondary | ICD-10-CM | POA: Diagnosis not present

## 2017-12-20 DIAGNOSIS — I5033 Acute on chronic diastolic (congestive) heart failure: Secondary | ICD-10-CM | POA: Diagnosis not present

## 2017-12-20 DIAGNOSIS — E7800 Pure hypercholesterolemia, unspecified: Secondary | ICD-10-CM | POA: Diagnosis not present

## 2017-12-20 DIAGNOSIS — Z7901 Long term (current) use of anticoagulants: Secondary | ICD-10-CM | POA: Diagnosis not present

## 2017-12-20 DIAGNOSIS — I350 Nonrheumatic aortic (valve) stenosis: Secondary | ICD-10-CM | POA: Diagnosis not present

## 2017-12-20 DIAGNOSIS — I480 Paroxysmal atrial fibrillation: Secondary | ICD-10-CM | POA: Diagnosis not present

## 2017-12-20 DIAGNOSIS — B9689 Other specified bacterial agents as the cause of diseases classified elsewhere: Secondary | ICD-10-CM | POA: Diagnosis not present

## 2017-12-20 DIAGNOSIS — I38 Endocarditis, valve unspecified: Secondary | ICD-10-CM | POA: Diagnosis not present

## 2017-12-20 DIAGNOSIS — I5032 Chronic diastolic (congestive) heart failure: Secondary | ICD-10-CM | POA: Diagnosis not present

## 2017-12-20 DIAGNOSIS — N183 Chronic kidney disease, stage 3 (moderate): Secondary | ICD-10-CM | POA: Diagnosis not present

## 2017-12-20 DIAGNOSIS — I482 Chronic atrial fibrillation, unspecified: Secondary | ICD-10-CM | POA: Diagnosis not present

## 2017-12-20 DIAGNOSIS — I130 Hypertensive heart and chronic kidney disease with heart failure and stage 1 through stage 4 chronic kidney disease, or unspecified chronic kidney disease: Secondary | ICD-10-CM | POA: Diagnosis not present

## 2017-12-20 DIAGNOSIS — I472 Ventricular tachycardia: Secondary | ICD-10-CM | POA: Diagnosis not present

## 2017-12-20 DIAGNOSIS — T83511A Infection and inflammatory reaction due to indwelling urethral catheter, initial encounter: Secondary | ICD-10-CM | POA: Diagnosis not present

## 2017-12-20 DIAGNOSIS — Z5181 Encounter for therapeutic drug level monitoring: Secondary | ICD-10-CM | POA: Diagnosis not present

## 2017-12-20 DIAGNOSIS — I272 Pulmonary hypertension, unspecified: Secondary | ICD-10-CM | POA: Diagnosis not present

## 2017-12-20 DIAGNOSIS — Z8673 Personal history of transient ischemic attack (TIA), and cerebral infarction without residual deficits: Secondary | ICD-10-CM | POA: Diagnosis not present

## 2017-12-20 DIAGNOSIS — Z466 Encounter for fitting and adjustment of urinary device: Secondary | ICD-10-CM | POA: Diagnosis not present

## 2017-12-20 DIAGNOSIS — C7951 Secondary malignant neoplasm of bone: Secondary | ICD-10-CM | POA: Diagnosis not present

## 2017-12-20 DIAGNOSIS — N390 Urinary tract infection, site not specified: Secondary | ICD-10-CM | POA: Diagnosis not present

## 2017-12-20 DIAGNOSIS — Z452 Encounter for adjustment and management of vascular access device: Secondary | ICD-10-CM | POA: Diagnosis not present

## 2017-12-20 NOTE — Telephone Encounter (Signed)
Received a call from the patient spouse to cancel the lab and MD appointment due to patient too weak to transport. Spouse stated that [facility] RN is coming out to the house and requesting that they draw the labs. Faxed labs to be drawn to [facility] for PSA,CBC, CMP per Dr. Alen Blew. Wells Guiles LPN with [facility] confirmed receipt of lab orders and stated that they need a signing MD for the [facility] skilled nursing visits since ID has signed off. Received verbal from Dr. Alen Blew that he will be signing MD for SN visit and communicated to [facility] for SN 1x1 week, 2 visits x 3 wks, 1 x 1 week, and for PT and OT to eval. [facility] contact [phone].

## 2017-12-22 DIAGNOSIS — N183 Chronic kidney disease, stage 3 (moderate): Secondary | ICD-10-CM | POA: Diagnosis not present

## 2017-12-22 DIAGNOSIS — B9689 Other specified bacterial agents as the cause of diseases classified elsewhere: Secondary | ICD-10-CM | POA: Diagnosis not present

## 2017-12-22 DIAGNOSIS — N390 Urinary tract infection, site not specified: Secondary | ICD-10-CM | POA: Diagnosis not present

## 2017-12-22 DIAGNOSIS — Z5181 Encounter for therapeutic drug level monitoring: Secondary | ICD-10-CM | POA: Diagnosis not present

## 2017-12-22 DIAGNOSIS — I480 Paroxysmal atrial fibrillation: Secondary | ICD-10-CM | POA: Diagnosis not present

## 2017-12-22 DIAGNOSIS — Z452 Encounter for adjustment and management of vascular access device: Secondary | ICD-10-CM | POA: Diagnosis not present

## 2017-12-22 DIAGNOSIS — I38 Endocarditis, valve unspecified: Secondary | ICD-10-CM | POA: Diagnosis not present

## 2017-12-22 DIAGNOSIS — Z8673 Personal history of transient ischemic attack (TIA), and cerebral infarction without residual deficits: Secondary | ICD-10-CM | POA: Diagnosis not present

## 2017-12-22 DIAGNOSIS — Z7901 Long term (current) use of anticoagulants: Secondary | ICD-10-CM | POA: Diagnosis not present

## 2017-12-22 DIAGNOSIS — C7951 Secondary malignant neoplasm of bone: Secondary | ICD-10-CM | POA: Diagnosis not present

## 2017-12-22 DIAGNOSIS — I5032 Chronic diastolic (congestive) heart failure: Secondary | ICD-10-CM | POA: Diagnosis not present

## 2017-12-22 DIAGNOSIS — I130 Hypertensive heart and chronic kidney disease with heart failure and stage 1 through stage 4 chronic kidney disease, or unspecified chronic kidney disease: Secondary | ICD-10-CM | POA: Diagnosis not present

## 2017-12-22 DIAGNOSIS — T83511A Infection and inflammatory reaction due to indwelling urethral catheter, initial encounter: Secondary | ICD-10-CM | POA: Diagnosis not present

## 2017-12-22 NOTE — Telephone Encounter (Signed)
Called and made wife aware that Dr. Irish Lack cannot give orders for the [facility] her aware that that will need to be managed by the CHF MDs. Made her aware that we could refer him to the CHF Clinic if she would like. She verbalized understanding and declines CHF referral at this time.

## 2017-12-22 NOTE — Telephone Encounter (Signed)
Called and spoke to [place] and made her aware that Dr. Irish Lack agreed to sign the [facility] orders for skilled nursing. Made her aware the signed orders will be faxed back to her.

## 2017-12-26 ENCOUNTER — Other Ambulatory Visit: Payer: Self-pay | Admitting: Oncology

## 2017-12-26 ENCOUNTER — Ambulatory Visit (INDEPENDENT_AMBULATORY_CARE_PROVIDER_SITE_OTHER): Payer: Medicare Other | Admitting: Pharmacist

## 2017-12-26 ENCOUNTER — Telehealth: Payer: Self-pay | Admitting: *Deleted

## 2017-12-26 ENCOUNTER — Telehealth: Payer: Self-pay

## 2017-12-26 DIAGNOSIS — Z5181 Encounter for therapeutic drug level monitoring: Secondary | ICD-10-CM

## 2017-12-26 DIAGNOSIS — I482 Chronic atrial fibrillation, unspecified: Secondary | ICD-10-CM | POA: Diagnosis not present

## 2017-12-26 LAB — POCT INR: INR: 1.9 — AB (ref 2.0–3.0)

## 2017-12-26 MED ORDER — TRAMADOL HCL 50 MG PO TABS: 50.0000 mg | ORAL_TABLET | Freq: Four times a day (QID) | ORAL | 0 refills | Status: AC | PRN

## 2017-12-26 NOTE — Telephone Encounter (Signed)
Patient's wife called to find out if [facility] nurse needed to draw any additional bloodwork on her visit tomorrow 11/5.  Per chart review of Dr Tommy Medal and Dr Comer's notes, no blood culture/ESR/CRP needed. RN called patient's wife back, left voice mail message with this information. Landis Gandy, RN

## 2017-12-26 NOTE — Telephone Encounter (Signed)
Per Stanton Kidney at [facility], patient's PICC was pulled before discharge from [facility]. Landis Gandy, RN

## 2017-12-26 NOTE — Telephone Encounter (Signed)
Received a call from patient spouse to make sure that the patient lab work was received from [facility]. She then stated that the patient requires a mechanical lift for all transfers and is almost bed bound. She stated that he continues to eat while on [place]. She has hired 24/7 home care assistance. Patient continues to take the [facility] and is also taking Tylenol BID to help with discomfort. Per request for prn pain medication Tramadol was called into local requested pharmacy by Dr. Alen Blew and patient spouse aware and appreciative. She is aware to call back with any other questions or concerns.

## 2017-12-27 DIAGNOSIS — I480 Paroxysmal atrial fibrillation: Secondary | ICD-10-CM | POA: Diagnosis not present

## 2017-12-27 DIAGNOSIS — Z8673 Personal history of transient ischemic attack (TIA), and cerebral infarction without residual deficits: Secondary | ICD-10-CM | POA: Diagnosis not present

## 2017-12-27 DIAGNOSIS — Z466 Encounter for fitting and adjustment of urinary device: Secondary | ICD-10-CM | POA: Diagnosis not present

## 2017-12-27 DIAGNOSIS — I130 Hypertensive heart and chronic kidney disease with heart failure and stage 1 through stage 4 chronic kidney disease, or unspecified chronic kidney disease: Secondary | ICD-10-CM | POA: Diagnosis not present

## 2017-12-27 DIAGNOSIS — T83511A Infection and inflammatory reaction due to indwelling urethral catheter, initial encounter: Secondary | ICD-10-CM | POA: Diagnosis not present

## 2017-12-27 DIAGNOSIS — N183 Chronic kidney disease, stage 3 (moderate): Secondary | ICD-10-CM | POA: Diagnosis not present

## 2017-12-27 DIAGNOSIS — I5033 Acute on chronic diastolic (congestive) heart failure: Secondary | ICD-10-CM | POA: Diagnosis not present

## 2017-12-27 DIAGNOSIS — R339 Retention of urine, unspecified: Secondary | ICD-10-CM | POA: Diagnosis not present

## 2017-12-27 DIAGNOSIS — Z452 Encounter for adjustment and management of vascular access device: Secondary | ICD-10-CM | POA: Diagnosis not present

## 2017-12-27 DIAGNOSIS — I350 Nonrheumatic aortic (valve) stenosis: Secondary | ICD-10-CM | POA: Diagnosis not present

## 2017-12-27 DIAGNOSIS — E7800 Pure hypercholesterolemia, unspecified: Secondary | ICD-10-CM | POA: Diagnosis not present

## 2017-12-27 DIAGNOSIS — I472 Ventricular tachycardia: Secondary | ICD-10-CM | POA: Diagnosis not present

## 2017-12-27 DIAGNOSIS — I482 Chronic atrial fibrillation, unspecified: Secondary | ICD-10-CM | POA: Diagnosis not present

## 2017-12-27 DIAGNOSIS — Z7901 Long term (current) use of anticoagulants: Secondary | ICD-10-CM | POA: Diagnosis not present

## 2017-12-27 DIAGNOSIS — I38 Endocarditis, valve unspecified: Secondary | ICD-10-CM | POA: Diagnosis not present

## 2017-12-27 DIAGNOSIS — Z5181 Encounter for therapeutic drug level monitoring: Secondary | ICD-10-CM | POA: Diagnosis not present

## 2017-12-27 DIAGNOSIS — I5032 Chronic diastolic (congestive) heart failure: Secondary | ICD-10-CM | POA: Diagnosis not present

## 2017-12-27 DIAGNOSIS — B9689 Other specified bacterial agents as the cause of diseases classified elsewhere: Secondary | ICD-10-CM | POA: Diagnosis not present

## 2017-12-27 DIAGNOSIS — I272 Pulmonary hypertension, unspecified: Secondary | ICD-10-CM | POA: Diagnosis not present

## 2017-12-27 DIAGNOSIS — C7951 Secondary malignant neoplasm of bone: Secondary | ICD-10-CM | POA: Diagnosis not present

## 2017-12-27 DIAGNOSIS — N390 Urinary tract infection, site not specified: Secondary | ICD-10-CM | POA: Diagnosis not present

## 2017-12-28 DIAGNOSIS — Z466 Encounter for fitting and adjustment of urinary device: Secondary | ICD-10-CM | POA: Diagnosis not present

## 2017-12-28 DIAGNOSIS — I5033 Acute on chronic diastolic (congestive) heart failure: Secondary | ICD-10-CM | POA: Diagnosis not present

## 2017-12-28 DIAGNOSIS — R339 Retention of urine, unspecified: Secondary | ICD-10-CM | POA: Diagnosis not present

## 2017-12-28 DIAGNOSIS — C7951 Secondary malignant neoplasm of bone: Secondary | ICD-10-CM | POA: Diagnosis not present

## 2017-12-29 ENCOUNTER — Ambulatory Visit (INDEPENDENT_AMBULATORY_CARE_PROVIDER_SITE_OTHER): Payer: Medicare Other | Admitting: Family Medicine

## 2017-12-29 ENCOUNTER — Encounter: Payer: Self-pay | Admitting: Family Medicine

## 2017-12-29 VITALS — BP 104/65 | HR 80 | Temp 97.3°F | Ht 72.0 in | Wt 190.0 lb

## 2017-12-29 DIAGNOSIS — T148XXA Other injury of unspecified body region, initial encounter: Secondary | ICD-10-CM

## 2017-12-29 DIAGNOSIS — B957 Other staphylococcus as the cause of diseases classified elsewhere: Secondary | ICD-10-CM

## 2017-12-29 DIAGNOSIS — J9601 Acute respiratory failure with hypoxia: Secondary | ICD-10-CM | POA: Diagnosis not present

## 2017-12-29 DIAGNOSIS — I5033 Acute on chronic diastolic (congestive) heart failure: Secondary | ICD-10-CM

## 2017-12-29 DIAGNOSIS — C412 Malignant neoplasm of vertebral column: Secondary | ICD-10-CM

## 2017-12-29 DIAGNOSIS — Z7901 Long term (current) use of anticoagulants: Secondary | ICD-10-CM

## 2017-12-29 DIAGNOSIS — Z9189 Other specified personal risk factors, not elsewhere classified: Secondary | ICD-10-CM | POA: Diagnosis not present

## 2017-12-29 DIAGNOSIS — Z711 Person with feared health complaint in whom no diagnosis is made: Secondary | ICD-10-CM

## 2017-12-29 DIAGNOSIS — I482 Chronic atrial fibrillation, unspecified: Secondary | ICD-10-CM

## 2017-12-29 DIAGNOSIS — Z7689 Persons encountering health services in other specified circumstances: Secondary | ICD-10-CM | POA: Diagnosis not present

## 2017-12-29 DIAGNOSIS — R7881 Bacteremia: Secondary | ICD-10-CM

## 2017-12-29 NOTE — Progress Notes (Signed)
New patient office visit note: Patient re-establishing after years  Impression and Recommendations:    1. Establishing care with new doctor, encounter for   2. Acute on chronic diastolic CHF (congestive heart failure) ([place])   3. Acute respiratory failure with hypoxia (HCC)   4. Malignant neoplasm of vertebral column, excluding sacrum and coccyx ([place])   5. At high risk for complication of immobility   6. Bacteremia, coagulase-negative staphylococcal   7. Chronic anticoagulation   8. Chronic atrial fibrillation   9. Concern about end of life   10. Abrasion of skin- ant shin     Acute on chronic diastolic CHF (congestive heart failure), Acute respiratory failure with hypoxia - pt sx stable at current - on RA currently, not using O2 as directed by Cards - d/c pt and wife S/Sx CHF exacerbation and keep vigilant - Continue to follow up with specialists as directed.   At high risk for complication of immobility - cont home health care; wife will be also paying personally for additional care once insurance caps out - Wife is unable to move pt easily and they do NOT have means to bring him into doctor's OV's regularly - Wife/ Pt agree to come in once yrly at least and prn - Pt is a retired Engineer, drilling and he and wife are well aware of pressure ulcers, need for freq changes in position etc.  - use TED hose QD and elevate feet     Recent Bacteremia, coagulase-negative staphylococcal- resolved  - no signs / sx infxn currently - PICC line out and skin well healed   Malignant neoplasm of vertebral column, excluding sacrum and coccyx - Patient will continue to follow up with oncology for txmnt - pain control per Onc/ specialists    Chronic anticoagulation, Chronic atrial fibrillation - Patient will continue with txmnt as directed by Cards - Wife/ pt has home INR machine to check and sends results into Cards, they adjust as needed   Recent Hospitalizations due to CHF -  reviewed recent labwork in chart- up to date and no need for draw today.  See end of note for details.. - Regarding pt's recent hospitalization and/or ED visit: reviewed in great detail recent hospitalization notes, clinical lab tests, tests in the radiology section of CPT, tests in the medicine testing of CPT, and obtained history from pt & family member.  - Patient has cardiology follow-up scheduled tom with Dr Jearld Lesch PA- I rec they keep it!!  - Sx stable - Advised patient to keep all appointments with cardiology as scheduled and recommended.  - Dr. Hassell Done group is following the patient's heart failure.  Dr. Irish Lack will continue directing treatment on lasix and potassium.  - Per wife, [place] will continue drawing blood for the patient to be sent to cardiology.   H/o Desaturation: - Advised patient's wife to order a pulse oximeter ( around 10$)  to monitor the patient's oxygen at home. - Advised patient's wife not to give the patient oxygen unless his oxygen drops below 92/ or per Cards recs.   End of life planning:  - Given the patient's prostate cancer status and recent congestive heart failure, discussed the critical need to revisit end of life questions.   - Wife states pt prior had DNR orders/ no extraordinary measures etc but recent hosp pt chose full code etc.  Wife has concerns about this today - Advised that patient and wife should discuss these concerns,  review the possibility of DNR order, and address questions with cardiology and oncology.   - Today I discussed questions that the patient and his wife need to seriously address and revisit.   Skin abrasion: - Advised patient and wife to leave the wound on lower left extremity open to air and let it air out since it is moist appearing - Also wound care per home health agency.  Discussed proper wound care with patient's wife as well.   - Discussed critical need for patient to continue following up  care management with specialists as scheduled and recommended.   Education and routine counseling performed.   Pt was in the office today for 60+ minutes, with over 50% time spent in face to face counseling of patients various medical conditions, treatment plans of those medical conditions including medicine management and lifestyle modification, strategies to improve health and well being; and in coordination of care. SEE ABOVE TREATMENT PLAN FOR DETAILS    Expresses verbal understanding and consents to current therapy plan and treatment regimen.  Return for 1 year f/up and PRN. Since pt's wife is unable to bring him in more freq   Please see AVS handed out to patient at the end of our visit for further patient instructions/ counseling done pertaining to today's office visit.    Note:  This document was prepared using Dragon voice recognition software and may include unintentional dictation errors.  This document serves as a record of services personally performed by Mellody Dance, DO. It was created on her behalf by Toni Amend, a trained medical scribe. The creation of this record is based on the scribe's personal observations and the provider's statements to them.   I have reviewed the above medical documentation for accuracy and completeness and I concur.  Mellody Dance, D.O.   -------------------------------------------------------------------------------------------------------------------------    Subjective:    Chief complaint:   Chief Complaint  Patient presents with  . Establish Care     HPI: Espiridion Supinski. is a pleasant 84 y.o. male who presents to [place] at [facility] today to review their medical history with me and establish care.   I asked the patient to review their chronic problem list with me to ensure everything was updated and accurate.    All recent office visits with other providers, any medical records that patient  brought in etc  - I reviewed today.     We asked pt to get [place] their medical records from [facility] providers/ specialists that they had seen within the past 3-5 years- if they are in private practice and/or do not work for [employer], [facility], [place], [place] or [employer] owned practice.  Told them to call their specialists to clarify this if they are not sure.   Patient is wheelchair bound.  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  Recent Hospitalizations Per patient's wife, he had two hospitalizations in September.  On September 7th, he was admitted to the hospital with vomiting, fever, and high blood pressure.  Wife notes it turned out to be bacteremia of some kind of staph.  Wife states it was a very uncommon type of staph, per the hospital.  Patient Ziaire has had an indwelling catheter for the past 4 years.  He follows up with urologist Festus Aloe. They believe that his infection could have potentially come from the catheter.  Deyvi went home with [place] after his hospitalization.  His wife states she  was taught how to do the necessary injections.  PICC line was placed on the twelfth of September (11/03/2017).  After this, the patient was doing fairly well for several days, 11-2 days.  Then, per wife, overnight on a Sunday he developed SOB and didn't sleep well.  She called the cardiologist's office; they advised her to take him to the ER.  Patient was in congestive heart failure.  In the hospital, he was placed on high doses of lasix, 160 mg twice a day per IV.  Wife states that he lost weight, down to 189 lbs, and had been weighed at 206 at one time.  She states that his weight stays around 191 or 192 lbs.    Patient's wife does not weigh him at home; states she can't.  Hospital has weighed him as well as the nursing home.  Health nurses check and measure the circumference of his ankles every day.  Wife notes Doris has gotten better since that last episode.  He was  placed on oxygen PRN, as it was dropping down to 88, 89.  Patient was sent to rehabilitation and placed on potassium "to replace all of the lasix."  Patient currently receives 40 mg of lasix twice daily by mouth.  Dr. Hassell Done group is following the patient's heart failure.  Patient left the nursing home on the 23rd of October. Has been home from the nursing home now for three weeks.  Pt wife states that cardiology has a congestive heart failure clinic, but per wife, Dr. Irish Lack states that "he doesn't feel they need to follow up at the heart failure clinic yet."  They are supposed to visit Dr. Hassell Done PA on Monday, but wife does not want to go because it's too hard to move the patient.  They have not used oxygen in a month but they still have it available at home.  Cardiologist felt that Iona Beard qualified for oxygen in the hospital because his levels dropped so low, but wife is paying for it out of pocket right now.  Home health nurses check his oxygen at home 2-3 times per week.  Patient and wife have been advised that "health things are beginning to pile up."  Discussed with specialists that he may begin to code.  Per wife, the patient had never had any issues except atrial fibrillation until he got the infection/bacteremia.  He had an echocardiogram done during his hospitalizations, and has developed a murmur in the past 6-8 months.  He has never been diagnosed with coronary artery disease prior to this, and has had no prior heart catheterizations or valve replacements.  Per wife, patient had a CBC and PSA done last week through Dr. Alen Blew of Oncology.  PSA came back at 324.  PSA a few months ago was around 134.  Per wife, patient does not have bone pain, and as long as he is not experiencing bone pain, he can continue management as prescribed.  Patient keeps his feet elevated at home to help alleviate lower extremity swelling.  When asked directly, patient states that he feels his quality  of life is "not bad, not yet."  Children and grandchildren are in constant contact with phone, and per wife they often come to visit.  Wife states he experiences fuzziness in the evening, "a little sundowners."  Patient had a stroke 17 years ago, and it affected one side of his throat.  Wife notes that he compensates really well, but sometimes when he turns too much his  throat gets closed off and he chokes a bit.  When he's at home in his lift chair, he is propped up to hold his head up.  Patient managed on coumadin.  Wife performs coumadin checks at home.  Patient is subject to several chronic care wounds from being bed bound.   Wt Readings from Last 3 Encounters:  12/29/17 190 lb (86.2 kg)  11/18/17 192 lb (87.1 kg)  11/03/17 208 lb 5.4 oz (94.5 kg)   BP Readings from Last 3 Encounters:  12/29/17 104/65  11/18/17 (!) 150/68  11/03/17 (!) 178/72   Pulse Readings from Last 3 Encounters:  12/29/17 80  11/18/17 (!) 45  11/03/17 91   BMI Readings from Last 3 Encounters:  12/29/17 25.77 kg/m  11/18/17 26.04 kg/m  11/03/17 28.26 kg/m    Patient Care Team    Relationship Specialty Notifications Start End  Mellody Dance, DO PCP - General Family Medicine  12/29/17   Jettie Booze, MD PCP - Cardiology Cardiology Admissions 11/15/17   Jettie Booze, MD Consulting Physician Cardiology  12/01/15   Garald Balding, MD Consulting Physician Orthopedic Surgery  12/01/15   [place], Amy, MD Consulting Physician Dermatology  12/01/15   Wyatt Portela, MD Consulting Physician Oncology  12/01/15    Comment: prostate CA-   Festus Aloe, MD Consulting Physician Urology  12/01/15   Calvert Cantor, MD Consulting Physician Ophthalmology  12/01/15   Marcial Pacas, MD Consulting Physician Neurology  12/01/15     Patient Active Problem List   Diagnosis Date Noted  . Concern about end of life 12/31/2017  . Aspiration pneumonia ([place])   . Acute respiratory failure with hypoxia ([place])    . NSVT (nonsustained ventricular tachycardia) ([place])   . Acute on chronic diastolic CHF (congestive heart failure) ([place]) 11/14/2017  . Endocarditis of native valve   . Bacteremia, coagulase-negative staphylococcal 11/01/2017  . UTI (urinary tract infection) 10/29/2017  . Bilateral sensorineural hearing loss 12/15/2015  . Bone cancer ([place]) 12/01/2015  . History of recurrent TIAs 12/01/2015  . Chronic anticoagulation 12/01/2015  . Hearing difficulty 12/01/2015  . At high risk for complication of immobility 12/01/2015  . Aortic stenosis 07/15/2015  . Stroke ([place])   . Hemispheric carotid artery syndrome   . Cardiomyopathy, ischemic   . Stroke-like symptoms 12/31/2014  . Catheter-associated urinary tract infection ([place]) 12/31/2014  . Hemoptysis 07/12/2014  . Hypertensive urgency 07/12/2014  . Pulmonary embolism ([place])   . Cough with hemoptysis   . Pulmonary embolus ([place]) 07/11/2014  . Fever 04/22/2014  . Dysarthria 04/21/2014  . CVA (cerebral infarction) 04/19/2014  . Prostate cancer, primary, with metastasis from prostate to other site [facility]) 04/19/2014  . Chronic back pain 04/19/2014  . Expressive aphasia   . Bone metastasis ([place]) 02/12/2014  . Chronic diastolic heart failure ([place]) 02/11/2014  . Hypertensive heart disease 02/11/2014  . Essential hypertension 02/11/2014  . History of stroke 02/11/2014  . AAA (abdominal aortic aneurysm) ([place]) 02/11/2014  . Hypokalemia 02/11/2014  . Back pain 02/09/2014  . Fluid overload 02/08/2014  . Left-sided weakness 02/08/2014  . Encounter for therapeutic drug monitoring 05/14/2013  . CKD (chronic kidney disease) stage 3, GFR 30-59 ml/min (HCC) 09/27/2012  . Elevated brain natriuretic peptide (BNP) level 09/27/2012  . Hypercalcemia 09/27/2012  . Weakness 09/27/2012  . Epistaxis 01/08/2011  . Cerebellar stroke syndrome 07/27/2010  . Hypercholesterolemia 07/27/2010  . Prostate cancer metastatic to bone ([place]) 07/27/2010  . Benign  hypertensive heart disease without heart  failure 07/27/2010  . Renal insufficiency 07/27/2010  . Chronic atrial fibrillation 05/25/2010       As reported by pt:  Past Medical History:  Diagnosis Date  . Atrial fibrillation ([place])   . Chronic back pain   . GERD (gastroesophageal reflux disease)   . H/O hiatal hernia   . History of epistaxis 07/19/2002  . Hyperlipidemia   . Hypertension   . Hypertensive heart disease   . Hypokalemia   . ICH (intracerebral hemorrhage) ([place]) ~ 1999   after TPA/notes 09/27/2012  . Melanoma ([place])    "top of my head" (09/27/2012)  . Migraines    "migraines without headaches years ago" (09/27/2012)  . Osteoarthritis of both feet   . PAT (paroxysmal atrial tachycardia) ([place])   . Personal history of long-term (current) use of anticoagulants   . Pneumonia    "once; several years ago" (09/27/2012)  . Prostate cancer, primary, with metastasis from prostate to other site [facility])    on Lupron injections per GU  . S/P radiation therapy 02/13/14-02/27/14   SRS 5/5 spine completed 02/27/14  . S/P radiation therapy 05/27/14-05/31/14   right femur 20Gy/41f  . Stroke ([facility] ~ 1999   ischemic / right cerebellar/posterior inferior cerebellar artery / right pos infarct     Past Surgical History:  Procedure Laterality Date  . CATARACT EXTRACTION W/ INTRAOCULAR LENS  IMPLANT, BILATERAL Bilateral ~ 2011  . MELANOMA EXCISION  2010   "pre-melanoma on top of head; did skin graft from left thigh to cover" (09/27/2012)  . NASAL SINUS SURGERY  1998  . SKIN GRAFT Right 2010  . TONSILLECTOMY  ~ 1935     Family History  Problem Relation Age of Onset  . Heart failure Mother   . Heart attack Father      Social History   Substance and Sexual Activity  Drug Use No     Social History   Substance and Sexual Activity  Alcohol Use No   Comment: 09/27/2012 "glass of wine or 2/month"     Social History   Tobacco Use  Smoking Status Former Smoker  . Years: 0.50  . Types:  Cigarettes  . Last attempt to quit: 02/22/1953  . Years since quitting: 64.8  Smokeless Tobacco Never Used     Current Meds  Medication Sig  . acetaminophen (TYLENOL) 500 MG tablet Take 1,000 mg by mouth daily.   .Marland KitchenamLODipine (NORVASC) 10 MG tablet Take 1 tablet (10 mg total) by mouth daily.  .[place] Kitchenatorvastatin (LIPITOR) 10 MG tablet Take 1 tablet (10 mg total) by mouth daily.  . B Complex-C (B-COMPLEX WITH VITAMIN C) tablet Take 1 tablet by mouth daily.    . calcium carbonate (TUMS - DOSED IN MG ELEMENTAL CALCIUM) 500 MG chewable tablet Chew 1 tablet by mouth daily.  . Denosumab (XGEVA [place]) Inject into the skin every 30 (thirty) days.   .Marland Kitchendocusate sodium (STOOL SOFTENER) 100 MG capsule Take 200 mg by mouth 2 (two) times daily.  .Marland Kitchenezetimibe (ZETIA) 10 MG tablet Take 1 tablet (10 mg total) by mouth daily.  . furosemide (LASIX) 40 MG tablet Take 1 tablet (40 mg total) by mouth 2 (two) times daily.  .Mar[place] KitchenLeuprolide Acetate (LUPRON DEPOT IM) Inject 1 each into the muscle every 6 (six) months.   .Marland Kitchenlosartan (COZAAR) 100 MG tablet Take 1 tablet (100 mg total) by mouth daily.  . Megestrol Acetate (MEGACE ORAL PO) Take 10 mLs by mouth.  . multivitamin (THERAGRAN) per  tablet Take 1 tablet by mouth daily. ( with B - Complex )  . Omega-3 Fatty Acids (FISH OIL PO) Take 1 capsule by mouth daily. ( Mega Red )  . ondansetron (ZOFRAN-ODT) 4 MG disintegrating tablet Take 1 tablet (4 mg total) by mouth every 8 (eight) hours as needed for nausea or vomiting.  Marland Kitchen oxybutynin (DITROPAN) 5 MG tablet Take 5 mg by mouth daily as needed for bladder spasms.   . polyethylene glycol (MIRALAX / GLYCOLAX) packet Take 17 g by mouth daily.  . potassium chloride SA (K-DUR,KLOR-CON) 20 MEQ tablet Take 1 tablet (20 mEq total) by mouth daily.  . Probiotic Product (PROBIOTIC ADVANCED PO) Take 1 capsule by mouth daily.  . traMADol (ULTRAM) 50 MG tablet Take 1 tablet (50 mg total) by mouth every 6 (six) hours as needed.  Marland Kitchen VITAMIN D PO  Take 4,000 Units by mouth daily.  Marland Kitchen warfarin (COUMADIN) 5 MG tablet Take 0.5-1 tablets (2.5-5 mg total) by mouth See admin instructions. Take one tablet (33m) on M TH SAT and half tablet (2.540m on SUN T W F or AS DIRECTED BY THE COUMADIN CLINIC  . XTANDI 40 MG capsule TAKE 1 CAPSULE (40 MG) BY MOUTH ONCE DAILY    Allergies: Pravachol; Pravastatin; Simvastatin; and Zocor [simvastatin - high dose]   Review of Systems  Constitutional: Negative for chills, diaphoresis, fever, malaise/fatigue and weight loss.  HENT: Positive for hearing loss. Negative for congestion, sore throat and tinnitus.   Eyes: Negative for blurred vision, double vision and photophobia.  Respiratory: Negative for cough and wheezing.   Cardiovascular: Negative for chest pain and palpitations.       Atrial fibrillation (chronic).  Gastrointestinal: Negative for blood in stool, diarrhea, nausea and vomiting.  Genitourinary: Negative for dysuria, frequency and urgency.       Foley catheter in place.  Musculoskeletal: Positive for joint pain and myalgias.  Skin: Negative for itching and rash.  Neurological: Negative for dizziness, focal weakness, weakness and headaches.  Endo/Heme/Allergies: Negative for environmental allergies and polydipsia. Does not bruise/bleed easily.  Psychiatric/Behavioral: Negative for depression and memory loss. The patient is not nervous/anxious and does not have insomnia.         Objective:   Blood pressure 104/65, pulse 80, temperature (!) 97.3 F (36.3 C), height 6' (1.829 m), weight 190 lb (86.2 kg), SpO2 99 %. Body mass index is 25.77 kg/m. General: Well Developed, well nourished, and in no acute distress.  Neuro: Alert and oriented x3, extra-ocular muscles intact, sensation grossly intact.  HEENT:[place]/AT, PERRLA, neck supple, No carotid bruits Skin: Well-healed PICC line lesion right upper arm.  Mild surrounding granulation tissue. Wound to left anterior shin with 4cm-6cm scrape  lesion, no evidence of infection, healing well.  No gross rashes. Cardiac: Patient in atrial fibrillation, irregularly irregular. Respiratory:  1/4-1/3 from the base up with bilateral crackles and rales, bibasilar.  Not using accessory muscles, speaking in full sentences. Abdominal: not grossly distended Musculoskeletal: Ambulates w/o diff, FROM * 4 ext.  Vasc: less 2 sec cap RF, warm and pink; scant amount of pitting edema bilateral LE. Psych:  No HI/SI, judgement and insight good, Euthymic mood. Full Affect.    Recent Results (from the past 2160 hour(s))  POCT INR     Status: Abnormal   Collection Time: 10/03/17 12:00 AM  Result Value Ref Range   INR 1.8 (A) 2.0 - 3.0    Comment: self tester  POCT INR     Status: Abnormal  Collection Time: 10/17/17 12:00 AM  Result Value Ref Range   INR 1.9 (A) 2.0 - 3.0    Comment: Self Tester  Urinalysis, Routine w reflex microscopic     Status: Abnormal   Collection Time: 10/29/17  6:03 PM  Result Value Ref Range   Color, Urine AMBER (A) YELLOW    Comment: BIOCHEMICALS MAY BE AFFECTED BY COLOR   APPearance HAZY (A) CLEAR   Specific Gravity, Urine 1.015 1.005 - 1.030   pH 5.0 5.0 - 8.0   Glucose, UA NEGATIVE NEGATIVE mg/dL   Hgb urine dipstick LARGE (A) NEGATIVE   Bilirubin Urine NEGATIVE NEGATIVE   Ketones, ur NEGATIVE NEGATIVE mg/dL   Protein, ur NEGATIVE NEGATIVE mg/dL   Nitrite NEGATIVE NEGATIVE   Leukocytes, UA MODERATE (A) NEGATIVE   RBC / HPF 21-50 0 - 5 RBC/hpf   WBC, UA 11-20 0 - 5 WBC/hpf   Bacteria, UA RARE (A) NONE SEEN   Mucus PRESENT    Amorphous Crystal PRESENT     Comment: Performed at [facility], [address] [place], [place] [address]  Urine culture     Status: Abnormal   Collection Time: 10/29/17  6:03 PM  Result Value Ref Range   Specimen Description URINE, CATHETERIZED    Special Requests      NONE Performed at [facility] [address], [place], [place] [address]    Culture MULTIPLE  SPECIES PRESENT, SUGGEST RECOLLECTION (A)    Report Status 10/31/2017 FINAL   Comprehensive metabolic panel     Status: Abnormal   Collection Time: 10/29/17  6:19 PM  Result Value Ref Range   Sodium 139 135 - 145 mmol/L   Potassium 4.8 3.5 - 5.1 mmol/L   Chloride 103 98 - 111 mmol/L   CO2 23 22 - 32 mmol/L   Glucose, Bld 161 (H) 70 - 99 mg/dL   BUN 43 (H) 8 - 23 mg/dL   Creatinine, Ser 1.36 (H) 0.61 - 1.24 mg/dL   Calcium 10.1 8.9 - 10.3 mg/dL   Total Protein 6.6 6.5 - 8.1 g/dL   Albumin 3.1 (L) 3.5 - 5.0 g/dL   AST 30 15 - 41 U/L   ALT 12 0 - 44 U/L   Alkaline Phosphatase 55 38 - 126 U/L   Total Bilirubin 1.5 (H) 0.3 - 1.2 mg/dL   GFR calc non Af Amer 44 (L) >60 mL/min   GFR calc Af Amer 51 (L) >60 mL/min    Comment: (NOTE) The eGFR has been calculated using the CKD EPI equation. This calculation has not been validated in all clinical situations. eGFR's persistently <60 mL/min signify possible Chronic Kidney Disease.    Anion gap 13 5 - 15    Comment: Performed at [facility] [address], [place], [place] [address]  CBC with Differential     Status: Abnormal   Collection Time: 10/29/17  6:19 PM  Result Value Ref Range   WBC 15.0 (H) 4.0 - 10.5 K/uL   RBC 4.38 4.22 - 5.81 MIL/uL   Hemoglobin 13.8 13.0 - 17.0 g/dL   HCT 42.5 39.0 - 52.0 %   MCV 97.0 78.0 - 100.0 fL   MCH 31.5 26.0 - 34.0 pg   MCHC 32.5 30.0 - 36.0 g/dL   RDW 14.4 11.5 - 15.5 %   Platelets 151 150 - 400 K/uL   Neutrophils Relative % 89 %   Neutro Abs 13.3 (H) 1.7 - 7.7 K/uL  Lymphocytes Relative 3 %   Lymphs Abs 0.5 (L) 0.7 - 4.0 K/uL   Monocytes Relative 7 %   Monocytes Absolute 1.0 0.1 - 1.0 K/uL   Eosinophils Relative 1 %   Eosinophils Absolute 0.1 0.0 - 0.7 K/uL   Basophils Relative 0 %   Basophils Absolute 0.0 0.0 - 0.1 K/uL   Immature Granulocytes 0 %   Abs Immature Granulocytes 0.1 0.0 - 0.1 K/uL    Comment: Performed at [facility] [address], [place], [place]  [address]  Protime-INR     Status: Abnormal   Collection Time: 10/29/17  6:19 PM  Result Value Ref Range   Prothrombin Time 20.2 (H) 11.4 - 15.2 seconds   INR 1.74     Comment: Performed at [facility] [address], [place], [place] [address]  Culture, blood (Routine x 2)     Status: Abnormal   Collection Time: 10/29/17  6:19 PM  Result Value Ref Range   Specimen Description BLOOD RIGHT WRIST    Special Requests      BOTTLES DRAWN AEROBIC AND ANAEROBIC Blood Culture adequate volume   Culture  Setup Time      GRAM POSITIVE COCCI IN CLUSTERS IN BOTH AEROBIC AND ANAEROBIC BOTTLES CRITICAL RESULT CALLED TO, READ BACK BY AND VERIFIED WITH: A. MASTERS, PHARMD AT 1325 ON 10/30/17 BY C. JESSUP, MLT.    Culture (A)     STAPHYLOCOCCUS LUGDUNENSIS SUSCEPTIBILITIES PERFORMED ON PREVIOUS CULTURE WITHIN THE LAST 5 DAYS. Performed at [facility], [place] [address] [place], [place] [address]    Report Status 11/01/2017 FINAL   Blood Culture ID Panel (Reflexed)     Status: Abnormal   Collection Time: 10/29/17  6:19 PM  Result Value Ref Range   Enterococcus species NOT DETECTED NOT DETECTED   Listeria monocytogenes NOT DETECTED NOT DETECTED   Staphylococcus species DETECTED (A) NOT DETECTED    Comment: Methicillin (oxacillin) susceptible coagulase negative staphylococcus. Possible blood culture contaminant (unless isolated from more than one blood culture draw or clinical case suggests pathogenicity). No antibiotic treatment is indicated for blood  culture contaminants. CRITICAL RESULT CALLED TO, READ BACK BY AND VERIFIED WITH: A. MASTERS, PHARMD AT 1325 ON 10/30/17 BY C. JESSUP, MLT.    Staphylococcus aureus (BCID) NOT DETECTED NOT DETECTED   Methicillin resistance NOT DETECTED NOT DETECTED   Streptococcus species NOT DETECTED NOT DETECTED   Streptococcus agalactiae NOT DETECTED NOT DETECTED   Streptococcus pneumoniae NOT DETECTED NOT DETECTED   Streptococcus pyogenes NOT DETECTED NOT  DETECTED   Acinetobacter baumannii NOT DETECTED NOT DETECTED   Enterobacteriaceae species NOT DETECTED NOT DETECTED   Enterobacter cloacae complex NOT DETECTED NOT DETECTED   Escherichia coli NOT DETECTED NOT DETECTED   Klebsiella oxytoca NOT DETECTED NOT DETECTED   Klebsiella pneumoniae NOT DETECTED NOT DETECTED   Proteus species NOT DETECTED NOT DETECTED   Serratia marcescens NOT DETECTED NOT DETECTED   Haemophilus influenzae NOT DETECTED NOT DETECTED   Neisseria meningitidis NOT DETECTED NOT DETECTED   Pseudomonas aeruginosa NOT DETECTED NOT DETECTED   Candida albicans NOT DETECTED NOT DETECTED   Candida glabrata NOT DETECTED NOT DETECTED   Candida krusei NOT DETECTED NOT DETECTED   Candida parapsilosis NOT DETECTED NOT DETECTED   Candida tropicalis NOT DETECTED NOT DETECTED    Comment: Performed at [facility] [address], [place],  [address]  I-Stat CG4 Lactic Acid, ED     Status: Abnormal  Collection Time: 10/29/17  6:29 PM  Result Value Ref Range   Lactic Acid, Venous 2.09 (HH) 0.5 - 1.9 mmol/L   Comment NOTIFIED PHYSICIAN   Culture, blood (Routine x 2)     Status: Abnormal   Collection Time: 10/29/17  7:15 PM  Result Value Ref Range   Specimen Description BLOOD RIGHT FOREARM    Special Requests      BOTTLES DRAWN AEROBIC AND ANAEROBIC Blood Culture adequate volume   Culture  Setup Time      GRAM POSITIVE COCCI IN BOTH AEROBIC AND ANAEROBIC BOTTLES CRITICAL RESULT CALLED TO, READ BACK BY AND VERIFIED WITH: A. MASTERS, PHARMD AT 1325 ON 10/30/17 BY C. JESSUP, MLT.    Culture STAPHYLOCOCCUS LUGDUNENSIS (A)    Report Status 11/01/2017 FINAL    Organism ID, Bacteria STAPHYLOCOCCUS LUGDUNENSIS       Susceptibility   Staphylococcus lugdunensis - MIC*    CIPROFLOXACIN <=0.5 SENSITIVE Sensitive     ERYTHROMYCIN <=0.25 SENSITIVE Sensitive     GENTAMICIN <=0.5 SENSITIVE Sensitive     OXACILLIN 1 SENSITIVE Sensitive     TETRACYCLINE <=1 SENSITIVE Sensitive      VANCOMYCIN <=0.5 SENSITIVE Sensitive     TRIMETH/SULFA <=10 SENSITIVE Sensitive     CLINDAMYCIN <=0.25 SENSITIVE Sensitive     RIFAMPIN <=0.5 SENSITIVE Sensitive     Inducible Clindamycin NEGATIVE Sensitive     * STAPHYLOCOCCUS LUGDUNENSIS  I-Stat CG4 Lactic Acid, ED     Status: None   Collection Time: 10/29/17  8:46 PM  Result Value Ref Range   Lactic Acid, Venous 1.54 0.5 - 1.9 mmol/L  Protime-INR     Status: Abnormal   Collection Time: 10/30/17  5:41 AM  Result Value Ref Range   Prothrombin Time 21.1 (H) 11.4 - 15.2 seconds   INR 1.84     Comment: Performed at [facility], [place] [address], [place], [place] [address]  CBC with Differential/Platelet     Status: Abnormal   Collection Time: 10/30/17  5:41 AM  Result Value Ref Range   WBC 14.0 (H) 4.0 - 10.5 K/uL   RBC 3.58 (L) 4.22 - 5.81 MIL/uL   Hemoglobin 11.2 (L) 13.0 - 17.0 g/dL   HCT 35.2 (L) 39.0 - 52.0 %   MCV 98.3 78.0 - 100.0 fL   MCH 31.3 26.0 - 34.0 pg   MCHC 31.8 30.0 - 36.0 g/dL   RDW 14.6 11.5 - 15.5 %   Platelets 128 (L) 150 - 400 K/uL   Neutrophils Relative % 78 %   Neutro Abs 10.9 (H) 1.7 - 7.7 K/uL   Lymphocytes Relative 11 %   Lymphs Abs 1.5 0.7 - 4.0 K/uL   Monocytes Relative 10 %   Monocytes Absolute 1.4 (H) 0.1 - 1.0 K/uL   Eosinophils Relative 0 %   Eosinophils Absolute 0.0 0.0 - 0.7 K/uL   Basophils Relative 0 %   Basophils Absolute 0.0 0.0 - 0.1 K/uL   Immature Granulocytes 1 %   Abs Immature Granulocytes 0.1 0.0 - 0.1 K/uL    Comment: Performed at [facility], [place] [address], [place],  [address]  Basic metabolic panel     Status: Abnormal   Collection Time: 10/30/17  5:41 AM  Result Value Ref Range   Sodium 140 135 - 145 mmol/L   Potassium 4.0 3.5 - 5.1 mmol/L   Chloride 107 98 - 111 mmol/L   CO2 26 22 - 32 mmol/L   Glucose, Bld  116 (H) 70 - 99 mg/dL   BUN 46 (H) 8 - 23 mg/dL   Creatinine, Ser 1.38 (H) 0.61 - 1.24 mg/dL   Calcium 9.3 8.9 - 10.3 mg/dL   GFR calc non Af  Amer 43 (L) >60 mL/min   GFR calc Af Amer 50 (L) >60 mL/min    Comment: (NOTE) The eGFR has been calculated using the CKD EPI equation. This calculation has not been validated in all clinical situations. eGFR's persistently <60 mL/min signify possible Chronic Kidney Disease.    Anion gap 7 5 - 15    Comment: Performed at [facility] [address], [place], [place] [address]  Magnesium     Status: None   Collection Time: 10/30/17  5:41 AM  Result Value Ref Range   Magnesium 1.9 1.7 - 2.4 mg/dL    Comment: Performed at [facility] [address], [place], [place] [address]  Culture, blood (Routine X 2) w Reflex to ID Panel     Status: None   Collection Time: 10/30/17  2:48 PM  Result Value Ref Range   Specimen Description BLOOD RIGHT ARM    Special Requests      BOTTLES DRAWN AEROBIC AND ANAEROBIC Blood Culture results may not be optimal due to an inadequate volume of blood received in culture bottles   Culture      NO GROWTH 5 DAYS Performed at [facility] [address] [place], [place] [address]    Report Status 11/04/2017 FINAL   Culture, blood (Routine X 2) w Reflex to ID Panel     Status: None   Collection Time: 10/30/17  2:59 PM  Result Value Ref Range   Specimen Description BLOOD LEFT ARM    Special Requests      BOTTLES DRAWN AEROBIC AND ANAEROBIC Blood Culture adequate volume   Culture      NO GROWTH 5 DAYS Performed at [facility], [place] [address], [place], [place] [address]    Report Status 11/04/2017 FINAL   Protime-INR     Status: Abnormal   Collection Time: 10/31/17  6:57 AM  Result Value Ref Range   Prothrombin Time 25.6 (H) 11.4 - 15.2 seconds   INR 2.35     Comment: Performed at [facility], [place] [address] [place], [place] [address]  CBC with Differential/Platelet     Status: Abnormal   Collection Time: 10/31/17  6:57 AM  Result Value Ref Range   WBC 9.3 4.0 - 10.5 K/uL   RBC 3.68 (L) 4.22 - 5.81 MIL/uL    Hemoglobin 11.6 (L) 13.0 - 17.0 g/dL   HCT 36.6 (L) 39.0 - 52.0 %   MCV 99.5 78.0 - 100.0 fL   MCH 31.5 26.0 - 34.0 pg   MCHC 31.7 30.0 - 36.0 g/dL   RDW 14.6 11.5 - 15.5 %   Platelets 125 (L) 150 - 400 K/uL   Neutrophils Relative % 72 %   Neutro Abs 6.7 1.7 - 7.7 K/uL   Lymphocytes Relative 13 %   Lymphs Abs 1.3 0.7 - 4.0 K/uL   Monocytes Relative 11 %   Monocytes Absolute 1.0 0.1 - 1.0 K/uL   Eosinophils Relative 3 %   Eosinophils Absolute 0.2 0.0 - 0.7 K/uL   Basophils Relative 0 %   Basophils Absolute 0.0 0.0 - 0.1 K/uL   Immature Granulocytes 1 %   Abs Immature Granulocytes 0.1 0.0 - 0.1 K/uL  Comment: Performed at [facility], [place] [address] [place], [place] [address]  Basic metabolic panel     Status: Abnormal   Collection Time: 10/31/17  6:57 AM  Result Value Ref Range   Sodium 140 135 - 145 mmol/L   Potassium 4.3 3.5 - 5.1 mmol/L   Chloride 107 98 - 111 mmol/L   CO2 27 22 - 32 mmol/L   Glucose, Bld 102 (H) 70 - 99 mg/dL   BUN 45 (H) 8 - 23 mg/dL   Creatinine, Ser 1.37 (H) 0.61 - 1.24 mg/dL   Calcium 9.2 8.9 - 10.3 mg/dL   GFR calc non Af Amer 44 (L) >60 mL/min   GFR calc Af Amer 51 (L) >60 mL/min    Comment: (NOTE) The eGFR has been calculated using the CKD EPI equation. This calculation has not been validated in all clinical situations. eGFR's persistently <60 mL/min signify possible Chronic Kidney Disease.    Anion gap 6 5 - 15    Comment: Performed at [facility] [address] [place], [place] [address]  Magnesium     Status: None   Collection Time: 10/31/17  6:57 AM  Result Value Ref Range   Magnesium 2.0 1.7 - 2.4 mg/dL    Comment: Performed at [facility] [address] [place], [place] [address]  Culture, Urine     Status: None   Collection Time: 10/31/17  8:09 PM  Result Value Ref Range   Specimen Description URINE, CATHETERIZED    Special Requests      NONE Performed at [facility] [address],  [place], [place] [address]    Culture NO GROWTH    Report Status 11/02/2017 FINAL   Protime-INR     Status: Abnormal   Collection Time: 11/01/17  6:24 AM  Result Value Ref Range   Prothrombin Time 24.8 (H) 11.4 - 15.2 seconds   INR 2.26     Comment: Performed at [facility], [place] [address], [place], [place] [address]  CBC with Differential/Platelet     Status: Abnormal   Collection Time: 11/01/17  6:24 AM  Result Value Ref Range   WBC 9.0 4.0 - 10.5 K/uL   RBC 3.91 (L) 4.22 - 5.81 MIL/uL   Hemoglobin 12.3 (L) 13.0 - 17.0 g/dL   HCT 38.3 (L) 39.0 - 52.0 %   MCV 98.0 78.0 - 100.0 fL   MCH 31.5 26.0 - 34.0 pg   MCHC 32.1 30.0 - 36.0 g/dL   RDW 14.2 11.5 - 15.5 %   Platelets 139 (L) 150 - 400 K/uL   Neutrophils Relative % 73 %   Neutro Abs 6.5 1.7 - 7.7 K/uL   Lymphocytes Relative 14 %   Lymphs Abs 1.3 0.7 - 4.0 K/uL   Monocytes Relative 11 %   Monocytes Absolute 1.0 0.1 - 1.0 K/uL   Eosinophils Relative 2 %   Eosinophils Absolute 0.2 0.0 - 0.7 K/uL   Basophils Relative 0 %   Basophils Absolute 0.0 0.0 - 0.1 K/uL   Immature Granulocytes 0 %   Abs Immature Granulocytes 0.0 0.0 - 0.1 K/uL    Comment: Performed at [facility], [address], [place], [place] [address]  Basic metabolic panel     Status: Abnormal   Collection Time: 11/01/17  6:24 AM  Result Value Ref Range   Sodium 138 135 - 145 mmol/L   Potassium 4.2 3.5 - 5.1 mmol/L  Chloride 104 98 - 111 mmol/L   CO2 25 22 - 32 mmol/L   Glucose, Bld 126 (H) 70 - 99 mg/dL   BUN 38 (H) 8 - 23 mg/dL   Creatinine, Ser 1.25 (H) 0.61 - 1.24 mg/dL   Calcium 9.3 8.9 - 10.3 mg/dL   GFR calc non Af Amer 49 (L) >60 mL/min   GFR calc Af Amer 57 (L) >60 mL/min    Comment: (NOTE) The eGFR has been calculated using the CKD EPI equation. This calculation has not been validated in all clinical situations. eGFR's persistently <60 mL/min signify possible Chronic Kidney Disease.    Anion gap 9 5 - 15    Comment: Performed at  [facility] [address], [place], [place] [address]  Magnesium     Status: None   Collection Time: 11/01/17  6:24 AM  Result Value Ref Range   Magnesium 2.2 1.7 - 2.4 mg/dL    Comment: Performed at [facility] [address] [place], [place] [address]  Hepatic function panel     Status: Abnormal   Collection Time: 11/01/17  6:24 AM  Result Value Ref Range   Total Protein 6.1 (L) 6.5 - 8.1 g/dL   Albumin 2.5 (L) 3.5 - 5.0 g/dL   AST 20 15 - 41 U/L   ALT 16 0 - 44 U/L   Alkaline Phosphatase 59 38 - 126 U/L   Total Bilirubin 0.9 0.3 - 1.2 mg/dL   Bilirubin, Direct 0.2 0.0 - 0.2 mg/dL   Indirect Bilirubin 0.7 0.3 - 0.9 mg/dL    Comment: Performed at [facility] [address] [place], [place] [address]  Protime-INR     Status: Abnormal   Collection Time: 11/02/17  5:51 AM  Result Value Ref Range   Prothrombin Time 27.7 (H) 11.4 - 15.2 seconds   INR 2.60     Comment: Performed at [facility] [address] [place], [place] [address]  CBC with Differential/Platelet     Status: Abnormal   Collection Time: 11/02/17  5:51 AM  Result Value Ref Range   WBC 7.5 4.0 - 10.5 K/uL   RBC 3.82 (L) 4.22 - 5.81 MIL/uL   Hemoglobin 11.9 (L) 13.0 - 17.0 g/dL   HCT 36.9 (L) 39.0 - 52.0 %   MCV 96.6 78.0 - 100.0 fL   MCH 31.2 26.0 - 34.0 pg   MCHC 32.2 30.0 - 36.0 g/dL   RDW 14.0 11.5 - 15.5 %   Platelets 144 (L) 150 - 400 K/uL   Neutrophils Relative % 67 %   Neutro Abs 5.0 1.7 - 7.7 K/uL   Lymphocytes Relative 17 %   Lymphs Abs 1.3 0.7 - 4.0 K/uL   Monocytes Relative 11 %   Monocytes Absolute 0.8 0.1 - 1.0 K/uL   Eosinophils Relative 4 %   Eosinophils Absolute 0.3 0.0 - 0.7 K/uL   Basophils Relative 1 %   Basophils Absolute 0.0 0.0 - 0.1 K/uL   Immature Granulocytes 0 %   Abs Immature Granulocytes 0.0 0.0 - 0.1 K/uL    Comment: Performed at [facility], [address], [place], [place] [address]  Basic metabolic panel     Status: Abnormal    Collection Time: 11/02/17  5:51 AM  Result Value Ref Range   Sodium 138 135 - 145 mmol/L   Potassium 4.1 3.5 - 5.1 mmol/L   Chloride 104 98 - 111 mmol/L  CO2 26 22 - 32 mmol/L   Glucose, Bld 114 (H) 70 - 99 mg/dL   BUN 31 (H) 8 - 23 mg/dL   Creatinine, Ser 1.21 0.61 - 1.24 mg/dL   Calcium 9.2 8.9 - 10.3 mg/dL   GFR calc non Af Amer 51 (L) >60 mL/min   GFR calc Af Amer 59 (L) >60 mL/min    Comment: (NOTE) The eGFR has been calculated using the CKD EPI equation. This calculation has not been validated in all clinical situations. eGFR's persistently <60 mL/min signify possible Chronic Kidney Disease.    Anion gap 8 5 - 15    Comment: Performed at [facility] [address], [place], [place] [address]  Magnesium     Status: None   Collection Time: 11/02/17  5:51 AM  Result Value Ref Range   Magnesium 2.0 1.7 - 2.4 mg/dL    Comment: Performed at [facility] [address], [place], [place] [address]  ECHOCARDIOGRAM COMPLETE     Status: None   Collection Time: 11/02/17 11:50 AM  Result Value Ref Range   Weight 3,195.79 oz   Height 72 in   BP 162/82 mmHg  Protime-INR     Status: Abnormal   Collection Time: 11/03/17  4:51 AM  Result Value Ref Range   Prothrombin Time 25.9 (H) 11.4 - 15.2 seconds   INR 2.39     Comment: Performed at [facility] [address] [place], [place] [address]  CBC with Differential/Platelet     Status: Abnormal   Collection Time: 11/03/17  4:51 AM  Result Value Ref Range   WBC 8.6 4.0 - 10.5 K/uL   RBC 3.96 (L) 4.22 - 5.81 MIL/uL   Hemoglobin 12.4 (L) 13.0 - 17.0 g/dL   HCT 38.2 (L) 39.0 - 52.0 %   MCV 96.5 78.0 - 100.0 fL   MCH 31.3 26.0 - 34.0 pg   MCHC 32.5 30.0 - 36.0 g/dL   RDW 13.9 11.5 - 15.5 %   Platelets 164 150 - 400 K/uL   Neutrophils Relative % 67 %   Neutro Abs 5.9 1.7 - 7.7 K/uL   Lymphocytes Relative 17 %   Lymphs Abs 1.5 0.7 - 4.0 K/uL   Monocytes Relative 10 %   Monocytes Absolute 0.9 0.1 - 1.0 K/uL    Eosinophils Relative 4 %   Eosinophils Absolute 0.3 0.0 - 0.7 K/uL   Basophils Relative 1 %   Basophils Absolute 0.0 0.0 - 0.1 K/uL   Immature Granulocytes 1 %   Abs Immature Granulocytes 0.1 0.0 - 0.1 K/uL    Comment: Performed at [facility] [address], [place], [place] [address]  Basic metabolic panel     Status: Abnormal   Collection Time: 11/03/17  4:51 AM  Result Value Ref Range   Sodium 138 135 - 145 mmol/L   Potassium 4.0 3.5 - 5.1 mmol/L   Chloride 104 98 - 111 mmol/L   CO2 24 22 - 32 mmol/L   Glucose, Bld 121 (H) 70 - 99 mg/dL   BUN 28 (H) 8 - 23 mg/dL   Creatinine, Ser 1.14 0.61 - 1.24 mg/dL   Calcium 9.5 8.9 - 10.3 mg/dL   GFR calc non Af Amer 55 (L) >60 mL/min   GFR calc Af Amer >60 >60 mL/min    Comment: (NOTE) The eGFR has been calculated using the CKD EPI equation. This calculation has not been validated in all  clinical situations. eGFR's persistently <60 mL/min signify possible Chronic Kidney Disease.    Anion gap 10 5 - 15    Comment: Performed at [facility] [address] [place], [place] [address]  Magnesium     Status: None   Collection Time: 11/03/17  4:51 AM  Result Value Ref Range   Magnesium 2.1 1.7 - 2.4 mg/dL    Comment: Performed at [facility] [address] [place], [place] [address]  POCT INR     Status: None   Collection Time: 11/14/17 12:00 AM  Result Value Ref Range   INR 2.1 2.0 - 3.0    Comment: [facility]  Culture, blood (routine x 2)     Status: None   Collection Time: 11/14/17 11:40 AM  Result Value Ref Range   Specimen Description BLOOD RIGHT HAND    Special Requests      BOTTLES DRAWN AEROBIC AND ANAEROBIC Blood Culture adequate volume   Culture      NO GROWTH 5 DAYS Performed at [facility], [place] [address], [place],  [address]    Report Status 11/19/2017 FINAL   Comprehensive metabolic panel     Status: Abnormal   Collection Time: 11/14/17 11:41 AM  Result Value Ref Range    Sodium 141 135 - 145 mmol/L   Potassium 3.8 3.5 - 5.1 mmol/L   Chloride 97 (L) 98 - 111 mmol/L   CO2 31 22 - 32 mmol/L   Glucose, Bld 107 (H) 70 - 99 mg/dL   BUN 24 (H) 8 - 23 mg/dL   Creatinine, Ser 1.21 0.61 - 1.24 mg/dL   Calcium 10.9 (H) 8.9 - 10.3 mg/dL   Total Protein 7.1 6.5 - 8.1 g/dL   Albumin 3.0 (L) 3.5 - 5.0 g/dL   AST 18 15 - 41 U/L   ALT <5 0 - 44 U/L    Comment: RESULT REPEATED AND VERIFIED   Alkaline Phosphatase 62 38 - 126 U/L   Total Bilirubin 0.8 0.3 - 1.2 mg/dL   GFR calc non Af Amer 51 (L) >60 mL/min   GFR calc Af Amer 59 (L) >60 mL/min    Comment: (NOTE) The eGFR has been calculated using the CKD EPI equation. This calculation has not been validated in all clinical situations. eGFR's persistently <60 mL/min signify possible Chronic Kidney Disease.    Anion gap 13 5 - 15    Comment: Performed at [facility] [address], [place],  [address]  CBC with Differential     Status: Abnormal   Collection Time: 11/14/17 11:41 AM  Result Value Ref Range   WBC 7.8 4.0 - 10.5 K/uL   RBC 4.32 4.22 - 5.81 MIL/uL   Hemoglobin 13.5 13.0 - 17.0 g/dL   HCT 43.7 39.0 - 52.0 %   MCV 101.2 (H) 78.0 - 100.0 fL   MCH 31.3 26.0 - 34.0 pg   MCHC 30.9 30.0 - 36.0 g/dL   RDW 15.9 (H) 11.5 - 15.5 %   Platelets 199 150 - 400 K/uL   Neutrophils Relative % 72 %   Neutro Abs 5.6 1.7 - 7.7 K/uL   Lymphocytes Relative 17 %   Lymphs Abs 1.3 0.7 - 4.0 K/uL   Monocytes Relative 8 %   Monocytes Absolute 0.6 0.1 - 1.0 K/uL   Eosinophils Relative 2 %   Eosinophils Absolute 0.2 0.0 - 0.7 K/uL   Basophils Relative 1 %   Basophils  Absolute 0.1 0.0 - 0.1 K/uL   Immature Granulocytes 0 %   Abs Immature Granulocytes 0.0 0.0 - 0.1 K/uL    Comment: Performed at [facility], [place] [address], [place], [place] [address]  Brain natriuretic peptide     Status: Abnormal   Collection Time: 11/14/17 11:41 AM  Result Value Ref Range   B Natriuretic Peptide 897.7 (H) 0.0 -  100.0 pg/mL    Comment: Performed at [facility] [address], [place], [place] [address]  Protime-INR     Status: Abnormal   Collection Time: 11/14/17 11:41 AM  Result Value Ref Range   Prothrombin Time 21.5 (H) 11.4 - 15.2 seconds   INR 1.89     Comment: Performed at [facility] [address] [place], [place] [address]  Culture, blood (routine x 2)     Status: None   Collection Time: 11/14/17 11:53 AM  Result Value Ref Range   Specimen Description BLOOD LEFT HAND    Special Requests      BOTTLES DRAWN AEROBIC AND ANAEROBIC Blood Culture adequate volume   Culture      NO GROWTH 5 DAYS Performed at [facility], [place] [address] [place], [place] [address]    Report Status 11/19/2017 FINAL   I-stat troponin, ED     Status: None   Collection Time: 11/14/17 12:04 PM  Result Value Ref Range   Troponin i, poc 0.00 0.00 - 0.08 ng/mL   Comment 3            Comment: Due to the release kinetics of cTnI, a negative result within the first hours of the onset of symptoms does not rule out myocardial infarction with certainty. If myocardial infarction is still suspected, repeat the test at appropriate intervals.   Urinalysis, Routine w reflex microscopic     Status: Abnormal   Collection Time: 11/14/17  1:24 PM  Result Value Ref Range   Color, Urine YELLOW YELLOW   APPearance CLEAR CLEAR   Specific Gravity, Urine 1.024 1.005 - 1.030   pH 5.0 5.0 - 8.0   Glucose, UA NEGATIVE NEGATIVE mg/dL   Hgb urine dipstick MODERATE (A) NEGATIVE   Bilirubin Urine NEGATIVE NEGATIVE   Ketones, ur 5 (A) NEGATIVE mg/dL   Protein, ur 100 (A) NEGATIVE mg/dL   Nitrite NEGATIVE NEGATIVE   Leukocytes, UA NEGATIVE NEGATIVE   RBC / HPF 21-50 0 - 5 RBC/hpf   WBC, UA 11-20 0 - 5 WBC/hpf   Bacteria, UA FEW (A) NONE SEEN   Squamous Epithelial / LPF 0-5 0 - 5   Hyaline Casts, UA PRESENT     Comment: Performed at [facility], [address], [place], [place] [address]  CBC WITH  DIFFERENTIAL     Status: Abnormal   Collection Time: 11/15/17  3:37 AM  Result Value Ref Range   WBC 9.7 4.0 - 10.5 K/uL   RBC 3.95 (L) 4.22 - 5.81 MIL/uL   Hemoglobin 12.3 (L) 13.0 - 17.0 g/dL   HCT 40.0 39.0 - 52.0 %   MCV 101.3 (H) 78.0 - 100.0 fL   MCH 31.1 26.0 - 34.0 pg   MCHC 30.8 30.0 - 36.0 g/dL   RDW 15.9 (H) 11.5 - 15.5 %   Platelets 212 150 - 400 K/uL   Neutrophils Relative % 75 %   Neutro Abs 7.4 1.7 - 7.7 K/uL   Lymphocytes Relative 13 %   Lymphs Abs  1.2 0.7 - 4.0 K/uL   Monocytes Relative 9 %   Monocytes Absolute 0.9 0.1 - 1.0 K/uL   Eosinophils Relative 2 %   Eosinophils Absolute 0.2 0.0 - 0.7 K/uL   Basophils Relative 1 %   Basophils Absolute 0.1 0.0 - 0.1 K/uL   Immature Granulocytes 0 %   Abs Immature Granulocytes 0.0 0.0 - 0.1 K/uL    Comment: Performed at [facility] [address], [place], [place] [address]  Basic metabolic panel     Status: Abnormal   Collection Time: 11/15/17  3:37 AM  Result Value Ref Range   Sodium 140 135 - 145 mmol/L   Potassium 3.7 3.5 - 5.1 mmol/L   Chloride 99 98 - 111 mmol/L   CO2 31 22 - 32 mmol/L   Glucose, Bld 115 (H) 70 - 99 mg/dL   BUN 22 8 - 23 mg/dL   Creatinine, Ser 1.14 0.61 - 1.24 mg/dL   Calcium 10.5 (H) 8.9 - 10.3 mg/dL   GFR calc non Af Amer 55 (L) >60 mL/min   GFR calc Af Amer >60 >60 mL/min    Comment: (NOTE) The eGFR has been calculated using the CKD EPI equation. This calculation has not been validated in all clinical situations. eGFR's persistently <60 mL/min signify possible Chronic Kidney Disease.    Anion gap 10 5 - 15    Comment: Performed at [facility] [address], [place], [place] [address]  Protime-INR     Status: Abnormal   Collection Time: 11/15/17  3:37 AM  Result Value Ref Range   Prothrombin Time 20.0 (H) 11.4 - 15.2 seconds   INR 1.72     Comment: Performed at [facility] [address] [place], [place] [address]  Magnesium     Status: None   Collection  Time: 11/15/17  3:37 AM  Result Value Ref Range   Magnesium 1.9 1.7 - 2.4 mg/dL    Comment: Performed at [facility] [address] [place], [place] [address]  Basic metabolic panel     Status: Abnormal   Collection Time: 11/16/17  2:59 AM  Result Value Ref Range   Sodium 138 135 - 145 mmol/L   Potassium 4.0 3.5 - 5.1 mmol/L   Chloride 97 (L) 98 - 111 mmol/L   CO2 33 (H) 22 - 32 mmol/L   Glucose, Bld 96 70 - 99 mg/dL   BUN 23 8 - 23 mg/dL   Creatinine, Ser 1.19 0.61 - 1.24 mg/dL   Calcium 9.5 8.9 - 10.3 mg/dL   GFR calc non Af Amer 52 (L) >60 mL/min   GFR calc Af Amer >60 >60 mL/min    Comment: (NOTE) The eGFR has been calculated using the CKD EPI equation. This calculation has not been validated in all clinical situations. eGFR's persistently <60 mL/min signify possible Chronic Kidney Disease.    Anion gap 8 5 - 15    Comment: Performed at [facility] [address] [place], [place] [address]  Protime-INR     Status: Abnormal   Collection Time: 11/16/17  2:59 AM  Result Value Ref Range   Prothrombin Time 19.5 (H) 11.4 - 15.2 seconds   INR 1.67     Comment: Performed at [facility] [address], [place], [place] [address]  Basic metabolic panel     Status: Abnormal   Collection Time: 11/17/17  3:11 AM  Result Value Ref  Range   Sodium 137 135 - 145 mmol/L   Potassium 3.5 3.5 - 5.1 mmol/L   Chloride 97 (L) 98 - 111 mmol/L   CO2 30 22 - 32 mmol/L   Glucose, Bld 106 (H) 70 - 99 mg/dL   BUN 25 (H) 8 - 23 mg/dL   Creatinine, Ser 1.23 0.61 - 1.24 mg/dL   Calcium 9.6 8.9 - 10.3 mg/dL   GFR calc non Af Amer 50 (L) >60 mL/min   GFR calc Af Amer 58 (L) >60 mL/min    Comment: (NOTE) The eGFR has been calculated using the CKD EPI equation. This calculation has not been validated in all clinical situations. eGFR's persistently <60 mL/min signify possible Chronic Kidney Disease.    Anion gap 10 5 - 15    Comment: Performed at [facility] [address] [place], [place] [address]  Protime-INR     Status: Abnormal   Collection Time: 11/17/17  3:11 AM  Result Value Ref Range   Prothrombin Time 20.7 (H) 11.4 - 15.2 seconds   INR 1.79     Comment: Performed at [facility] [address], [place], [place] [address]  Magnesium     Status: None   Collection Time: 11/17/17  3:11 AM  Result Value Ref Range   Magnesium 1.9 1.7 - 2.4 mg/dL    Comment: Performed at [facility] [address], [place], [place] [address]  Protime-INR     Status: Abnormal   Collection Time: 11/18/17  3:55 AM  Result Value Ref Range   Prothrombin Time 21.7 (H) 11.4 - 15.2 seconds   INR 1.90     Comment: Performed at [facility] [address], [place], [place] [address]  POCT INR     Status: Abnormal   Collection Time: 12/19/17 12:00 AM  Result Value Ref Range   INR 1.6 (A) 2.0 - 3.0    Comment: Self Tester  POCT INR     Status: Abnormal   Collection Time: 12/26/17 12:00 AM  Result Value Ref Range   INR 1.9 (A) 2.0 - 3.0    Comment: self tester

## 2017-12-29 NOTE — Patient Instructions (Signed)
Heart Failure Heart failure is a condition in which the heart has trouble pumping blood because it has become weak or stiff. This means that the heart does not pump blood efficiently for the body to work well. For some people with heart failure, fluid may back up into the lungs and there may be swelling (edema) in the lower legs. Heart failure is usually a long-term (chronic) condition. It is important for you to take good care of yourself and follow the treatment plan from your health care provider. What are the causes? This condition is caused by some health problems, including:  High blood pressure (hypertension). Hypertension causes the heart muscle to work harder than normal. High blood pressure eventually causes the heart to become stiff and weak.  Coronary artery disease (CAD). CAD is the buildup of cholesterol and fat (plaques) in the arteries of the heart.  Heart attack (myocardial infarction). Injured tissue, which is caused by the heart attack, does not contract as well and the heart's ability to pump blood is weakened.  Abnormal heart valves. When the heart valves do not open and close properly, the heart muscle must pump harder to keep the blood flowing.  Heart muscle disease (cardiomyopathy or myocarditis). Heart muscle disease is damage to the heart muscle from a variety of causes, such as drug or alcohol abuse, infections, or unknown causes. These can increase the risk of heart failure.  Lung disease. When the lungs do not work properly, the heart must work harder.  What increases the risk? Risk of heart failure increases as a person ages. This condition is also more likely to develop in people who:  Are overweight.  Are male.  Smoke or chew tobacco.  Abuse alcohol or illegal drugs.  Have taken medicines that can damage the heart, such as chemotherapy drugs.  Have diabetes. ? High blood sugar (glucose) is associated with high fat (lipid) levels in the blood. ? Diabetes  can also damage tiny blood vessels that carry nutrients to the heart muscle.  Have abnormal heart rhythms.  Have thyroid problems.  Have low blood counts (anemia).  What are the signs or symptoms? Symptoms of this condition include:  Shortness of breath with activity, such as when climbing stairs.  Persistent cough.  Swelling of the feet, ankles, legs, or abdomen.  Unexplained weight gain.  Difficulty breathing when lying flat (orthopnea).  Waking from sleep because of the need to sit up and get more air.  Rapid heartbeat.  Fatigue and loss of energy.  Feeling light-headed, dizzy, or close to fainting.  Loss of appetite.  Nausea.  Increased urination during the night (nocturia).  Confusion.  How is this diagnosed? This condition is diagnosed based on:  Medical history, symptoms, and a physical exam.  Diagnostic tests, which may include: ? Echocardiogram. ? Electrocardiogram (ECG). ? Chest X-ray. ? Blood tests. ? Exercise stress test. ? Radionuclide scans. ? Cardiac catheterization and angiogram.  How is this treated? Treatment for this condition is aimed at managing the symptoms of heart failure. Medicines, behavioral changes, or other treatments may be necessary to treat heart failure. Medicines These may include:  Angiotensin-converting enzyme (ACE) inhibitors. This type of medicine blocks the effects of a blood protein called angiotensin-converting enzyme. ACE inhibitors relax (dilate) the blood vessels and help to lower blood pressure.  Angiotensin receptor blockers (ARBs). This type of medicine blocks the actions of a blood protein called angiotensin. ARBs dilate the blood vessels and help to lower blood pressure.  Water   pills (diuretics). Diuretics cause the kidneys to remove salt and water from the blood. The extra fluid is removed through urination, leaving a lower volume of blood that the heart has to pump.  Beta blockers. These improve heart  muscle strength and they prevent the heart from beating too quickly.  Digoxin. This increases the force of the heartbeat.  Healthy behavior changes These may include:  Reaching and maintaining a healthy weight.  Stopping smoking or chewing tobacco.  Eating heart-healthy foods.  Limiting or avoiding alcohol.  Stopping use of street drugs (illegal drugs).  Physical activity.  Other treatments These may include:  Surgery to open blocked coronary arteries or repair damaged heart valves.  Placement of a biventricular pacemaker to improve heart muscle function (cardiac resynchronization therapy). This device paces both the right ventricle and left ventricle.  Placement of a device to treat serious abnormal heart rhythms (implantable cardioverter defibrillator, or ICD).  Placement of a device to improve the pumping ability of the heart (left ventricular assist device, or LVAD).  Heart transplant. This can cure heart failure, and it is considered for certain patients who do not improve with other therapies.  Follow these instructions at home: Medicines  Take over-the-counter and prescription medicines only as told by your health care provider. Medicines are important in reducing the workload of your heart, slowing the progression of heart failure, and improving your symptoms. ? Do not stop taking your medicine unless your health care provider told you to do that. ? Do not skip any dose of medicine. ? Refill your prescriptions before you run out of medicine. You need your medicines every day. Eating and drinking   Eat heart-healthy foods. Talk with a dietitian to make an eating plan that is right for you. ? Choose foods that contain no trans fat and are low in saturated fat and cholesterol. Healthy choices include fresh or frozen fruits and vegetables, fish, lean meats, legumes, fat-free or low-fat dairy products, and whole-grain or high-fiber foods. ? Limit salt (sodium) if  directed by your health care provider. Sodium restriction may reduce symptoms of heart failure. Ask a dietitian to recommend heart-healthy seasonings. ? Use healthy cooking methods instead of frying. Healthy methods include roasting, grilling, broiling, baking, poaching, steaming, and stir-frying.  Limit your fluid intake if directed by your health care provider. Fluid restriction may reduce symptoms of heart failure. Lifestyle  Stop smoking or using chewing tobacco. Nicotine and tobacco can damage your heart and your blood vessels. Do not use nicotine gum or patches before talking to your health care provider.  Limit alcohol intake to no more than 1 drink per day for non-pregnant women and 2 drinks per day for men. One drink equals 12 oz of beer, 5 oz of wine, or 1 oz of hard liquor. ? Drinking more than that is harmful to your heart. Tell your health care provider if you drink alcohol several times a week. ? Talk with your health care provider about whether any level of alcohol use is safe for you. ? If your heart has already been damaged by alcohol or you have severe heart failure, drinking alcohol should be stopped completely.  Stop use of illegal drugs.  Lose weight if directed by your health care provider. Weight loss may reduce symptoms of heart failure.  Do moderate physical activity if directed by your health care provider. People who are elderly and people with severe heart failure should consult with a health care provider for physical activity recommendations.   Monitor important information  Weigh yourself every day. Keeping track of your weight daily helps you to notice excess fluid sooner. ? Weigh yourself every morning after you urinate and before you eat breakfast. ? Wear the same amount of clothing each time you weigh yourself. ? Record your daily weight. Provide your health care provider with your weight record.  Monitor and record your blood pressure as told by your health  care provider.  Check your pulse as told by your health care provider. Dealing with extreme temperatures  If the weather is extremely hot: ? Avoid vigorous physical activity. ? Use air conditioning or fans or seek a cooler location. ? Avoid caffeine and alcohol. ? Wear loose-fitting, lightweight, and light-colored clothing.  If the weather is extremely cold: ? Avoid vigorous physical activity. ? Layer your clothes. ? Wear mittens or gloves, a hat, and a scarf when you go outside. ? Avoid alcohol. General instructions  Manage other health conditions such as hypertension, diabetes, thyroid disease, or abnormal heart rhythms as told by your health care provider.  Learn to manage stress. If you need help to do this, ask your health care provider.  Plan rest periods when fatigued.  Get ongoing education and support as needed.  Participate in or seek rehabilitation as needed to maintain or improve independence and quality of life.  Stay up to date with immunizations. Keeping current on pneumococcal and influenza immunizations is especially important to prevent respiratory infections.  Keep all follow-up visits as told by your health care provider. This is important. Contact a health care provider if:  You have a rapid weight gain.  You have increasing shortness of breath that is unusual for you.  You are unable to participate in your usual physical activities.  You tire easily.  You cough more than normal, especially with physical activity.  You have any swelling or more swelling in areas such as your hands, feet, ankles, or abdomen.  You are unable to sleep because it is hard to breathe.  You feel like your heart is beating quickly (palpitations).  You become dizzy or light-headed when you stand up. Get help right away if:  You have difficulty breathing.  You notice or your family notices a change in your awareness, such as having trouble staying awake or having  difficulty with concentration.  You have pain or discomfort in your chest.  You have an episode of fainting (syncope). This information is not intended to replace advice given to you by your health care provider. Make sure you discuss any questions you have with your health care provider. Document Released: 02/08/2005 Document Revised: 10/14/2015 Document Reviewed: 09/03/2015 Elsevier Interactive Patient Education  2018 Elsevier Inc.  

## 2017-12-30 ENCOUNTER — Telehealth: Payer: Self-pay | Admitting: Interventional Cardiology

## 2017-12-30 DIAGNOSIS — I5032 Chronic diastolic (congestive) heart failure: Secondary | ICD-10-CM | POA: Diagnosis not present

## 2017-12-30 DIAGNOSIS — Z452 Encounter for adjustment and management of vascular access device: Secondary | ICD-10-CM | POA: Diagnosis not present

## 2017-12-30 DIAGNOSIS — T83511A Infection and inflammatory reaction due to indwelling urethral catheter, initial encounter: Secondary | ICD-10-CM | POA: Diagnosis not present

## 2017-12-30 DIAGNOSIS — I38 Endocarditis, valve unspecified: Secondary | ICD-10-CM | POA: Diagnosis not present

## 2017-12-30 DIAGNOSIS — I130 Hypertensive heart and chronic kidney disease with heart failure and stage 1 through stage 4 chronic kidney disease, or unspecified chronic kidney disease: Secondary | ICD-10-CM | POA: Diagnosis not present

## 2017-12-30 DIAGNOSIS — B9689 Other specified bacterial agents as the cause of diseases classified elsewhere: Secondary | ICD-10-CM | POA: Diagnosis not present

## 2017-12-30 DIAGNOSIS — C7951 Secondary malignant neoplasm of bone: Secondary | ICD-10-CM | POA: Diagnosis not present

## 2017-12-30 DIAGNOSIS — Z7901 Long term (current) use of anticoagulants: Secondary | ICD-10-CM | POA: Diagnosis not present

## 2017-12-30 DIAGNOSIS — Z5181 Encounter for therapeutic drug level monitoring: Secondary | ICD-10-CM | POA: Diagnosis not present

## 2017-12-30 DIAGNOSIS — I480 Paroxysmal atrial fibrillation: Secondary | ICD-10-CM | POA: Diagnosis not present

## 2017-12-30 DIAGNOSIS — N183 Chronic kidney disease, stage 3 (moderate): Secondary | ICD-10-CM | POA: Diagnosis not present

## 2017-12-30 DIAGNOSIS — Z8673 Personal history of transient ischemic attack (TIA), and cerebral infarction without residual deficits: Secondary | ICD-10-CM | POA: Diagnosis not present

## 2017-12-30 DIAGNOSIS — N390 Urinary tract infection, site not specified: Secondary | ICD-10-CM | POA: Diagnosis not present

## 2017-12-30 NOTE — Telephone Encounter (Signed)
Returned call to patient's wife (DPR on file). She states that the patient saw Dr. Raliegh Scarlet yesterday and got a good report. She states that she cancelled his appointment for Monday and did not want to bring him in since he was doing well. She states that in order to bring him in she would have to use the hoyer lift and bring him in on a stretcher. She states that she will let us know if he has any change in his Sx and that he will keep his appointment scheduled in January.

## 2017-12-30 NOTE — Telephone Encounter (Signed)
New message:      Pt's wife is calling and would like to discuss the reason for cancelling this appt for Monday. She states they went to see his PCP and they told them all his labs looked good and they think it's okay to wait until January.

## 2017-12-31 DIAGNOSIS — Z711 Person with feared health complaint in whom no diagnosis is made: Secondary | ICD-10-CM | POA: Insufficient documentation

## 2018-01-02 ENCOUNTER — Ambulatory Visit (INDEPENDENT_AMBULATORY_CARE_PROVIDER_SITE_OTHER): Payer: Medicare Other | Admitting: Pharmacist

## 2018-01-02 ENCOUNTER — Ambulatory Visit: Payer: Medicare Other | Admitting: Cardiology

## 2018-01-02 DIAGNOSIS — Z5181 Encounter for therapeutic drug level monitoring: Secondary | ICD-10-CM | POA: Diagnosis not present

## 2018-01-02 DIAGNOSIS — I482 Chronic atrial fibrillation, unspecified: Secondary | ICD-10-CM | POA: Diagnosis not present

## 2018-01-02 LAB — POCT INR: INR: 1.9 — AB (ref 2.0–3.0)

## 2018-01-03 DIAGNOSIS — E7800 Pure hypercholesterolemia, unspecified: Secondary | ICD-10-CM | POA: Diagnosis not present

## 2018-01-03 DIAGNOSIS — R339 Retention of urine, unspecified: Secondary | ICD-10-CM | POA: Diagnosis not present

## 2018-01-03 DIAGNOSIS — I272 Pulmonary hypertension, unspecified: Secondary | ICD-10-CM | POA: Diagnosis not present

## 2018-01-03 DIAGNOSIS — I350 Nonrheumatic aortic (valve) stenosis: Secondary | ICD-10-CM | POA: Diagnosis not present

## 2018-01-03 DIAGNOSIS — I130 Hypertensive heart and chronic kidney disease with heart failure and stage 1 through stage 4 chronic kidney disease, or unspecified chronic kidney disease: Secondary | ICD-10-CM | POA: Diagnosis not present

## 2018-01-03 DIAGNOSIS — Z466 Encounter for fitting and adjustment of urinary device: Secondary | ICD-10-CM | POA: Diagnosis not present

## 2018-01-03 DIAGNOSIS — Z5181 Encounter for therapeutic drug level monitoring: Secondary | ICD-10-CM | POA: Diagnosis not present

## 2018-01-03 DIAGNOSIS — Z7901 Long term (current) use of anticoagulants: Secondary | ICD-10-CM | POA: Diagnosis not present

## 2018-01-03 DIAGNOSIS — I472 Ventricular tachycardia: Secondary | ICD-10-CM | POA: Diagnosis not present

## 2018-01-03 DIAGNOSIS — N183 Chronic kidney disease, stage 3 (moderate): Secondary | ICD-10-CM | POA: Diagnosis not present

## 2018-01-03 DIAGNOSIS — Z8673 Personal history of transient ischemic attack (TIA), and cerebral infarction without residual deficits: Secondary | ICD-10-CM | POA: Diagnosis not present

## 2018-01-03 DIAGNOSIS — I482 Chronic atrial fibrillation, unspecified: Secondary | ICD-10-CM | POA: Diagnosis not present

## 2018-01-03 DIAGNOSIS — C7951 Secondary malignant neoplasm of bone: Secondary | ICD-10-CM | POA: Diagnosis not present

## 2018-01-03 DIAGNOSIS — I5033 Acute on chronic diastolic (congestive) heart failure: Secondary | ICD-10-CM | POA: Diagnosis not present

## 2018-01-06 ENCOUNTER — Telehealth: Payer: Self-pay | Admitting: Interventional Cardiology

## 2018-01-06 NOTE — Telephone Encounter (Signed)
Returned call to patient's wife (DPR on file). She states that the patient has had some swelling in his ankles and behind his toes. She is unable to weight the patient due to being non weight bearing. Denies SOB. She states that she was told while the patient was in the hospital that if he had increased swelling she could give him extra lasix. Patient is taking lasix 40 mg BID. Made her aware that she can give him an extra tablet to see if swelling improves. Instructed for the patient to avoid salt and to keep his feet elevated. Instructed to let us know if his Sx changes or worsened. Wife verbalized understanding and thanked me for the call.

## 2018-01-06 NOTE — Telephone Encounter (Signed)
Pt c/o swelling: STAT is pt has developed SOB within 24 hours  1) How much weight have you gained and in what time span?  unable to weigh him*  2) If swelling, where is the swelling located? Left toes and area behind his toes  3) Are you currently taking a fluid pill? Lasix  4) Are you currently SOB? no  5) Do you have a log of your daily weights (if so, list)?   6) Have you gained 3 pounds in a day or 5 pounds in a week?  7) Have you traveled recently?no

## 2018-01-09 ENCOUNTER — Other Ambulatory Visit: Payer: Self-pay | Admitting: Oncology

## 2018-01-09 DIAGNOSIS — C61 Malignant neoplasm of prostate: Secondary | ICD-10-CM

## 2018-01-09 DIAGNOSIS — C7951 Secondary malignant neoplasm of bone: Secondary | ICD-10-CM

## 2018-01-12 ENCOUNTER — Telehealth: Payer: Self-pay | Admitting: *Deleted

## 2018-01-12 DIAGNOSIS — I5033 Acute on chronic diastolic (congestive) heart failure: Secondary | ICD-10-CM | POA: Diagnosis not present

## 2018-01-12 DIAGNOSIS — Z466 Encounter for fitting and adjustment of urinary device: Secondary | ICD-10-CM | POA: Diagnosis not present

## 2018-01-12 DIAGNOSIS — I482 Chronic atrial fibrillation, unspecified: Secondary | ICD-10-CM | POA: Diagnosis not present

## 2018-01-12 DIAGNOSIS — C7951 Secondary malignant neoplasm of bone: Secondary | ICD-10-CM | POA: Diagnosis not present

## 2018-01-12 DIAGNOSIS — R339 Retention of urine, unspecified: Secondary | ICD-10-CM | POA: Diagnosis not present

## 2018-01-12 DIAGNOSIS — Z5181 Encounter for therapeutic drug level monitoring: Secondary | ICD-10-CM | POA: Diagnosis not present

## 2018-01-12 DIAGNOSIS — I350 Nonrheumatic aortic (valve) stenosis: Secondary | ICD-10-CM | POA: Diagnosis not present

## 2018-01-12 DIAGNOSIS — I472 Ventricular tachycardia: Secondary | ICD-10-CM | POA: Diagnosis not present

## 2018-01-12 DIAGNOSIS — I130 Hypertensive heart and chronic kidney disease with heart failure and stage 1 through stage 4 chronic kidney disease, or unspecified chronic kidney disease: Secondary | ICD-10-CM | POA: Diagnosis not present

## 2018-01-12 DIAGNOSIS — Z7901 Long term (current) use of anticoagulants: Secondary | ICD-10-CM | POA: Diagnosis not present

## 2018-01-12 DIAGNOSIS — I272 Pulmonary hypertension, unspecified: Secondary | ICD-10-CM | POA: Diagnosis not present

## 2018-01-12 DIAGNOSIS — N183 Chronic kidney disease, stage 3 (moderate): Secondary | ICD-10-CM | POA: Diagnosis not present

## 2018-01-12 DIAGNOSIS — Z8673 Personal history of transient ischemic attack (TIA), and cerebral infarction without residual deficits: Secondary | ICD-10-CM | POA: Diagnosis not present

## 2018-01-12 DIAGNOSIS — E7800 Pure hypercholesterolemia, unspecified: Secondary | ICD-10-CM | POA: Diagnosis not present

## 2018-01-12 NOTE — Telephone Encounter (Signed)
Nurse from [facility] calling to say she would be able to draw monthly labs for patient at home during her visit. Just needs a verbal order for what labs are needed if dr Alen Blew agrees

## 2018-01-12 NOTE — Telephone Encounter (Signed)
CBC, CMET and PSA

## 2018-01-13 ENCOUNTER — Telehealth: Payer: Self-pay | Admitting: *Deleted

## 2018-01-13 NOTE — Telephone Encounter (Signed)
Spoke with  Nurse Ronny Bacon, @ [facility].  V.O. Given per dr Alen Blew, for CBC, C-MET and PSA to be drawn monthly with nurse visit to the home. Wife betty aware.

## 2018-01-14 DIAGNOSIS — I5033 Acute on chronic diastolic (congestive) heart failure: Secondary | ICD-10-CM | POA: Diagnosis not present

## 2018-01-16 ENCOUNTER — Ambulatory Visit (INDEPENDENT_AMBULATORY_CARE_PROVIDER_SITE_OTHER): Payer: Medicare Other

## 2018-01-16 DIAGNOSIS — I482 Chronic atrial fibrillation, unspecified: Secondary | ICD-10-CM | POA: Diagnosis not present

## 2018-01-16 DIAGNOSIS — Z5181 Encounter for therapeutic drug level monitoring: Secondary | ICD-10-CM

## 2018-01-16 LAB — POCT INR: INR: 1.7 — AB (ref 2.0–3.0)

## 2018-01-16 NOTE — Patient Instructions (Signed)
Description   Spoke with pt's wife and instructed to have pt to take 1.5 tablets today then continue taking same dose 1/2 tablet daily except 1 tablet on Mondays.  Recheck in 2 week-Self-tester. Call with any concerns new medication or if scheduled for any procedures [phone]

## 2018-01-23 ENCOUNTER — Telehealth: Payer: Self-pay | Admitting: Family Medicine

## 2018-01-23 NOTE — Telephone Encounter (Signed)
Patient's wife called on behalf of patient requesting a refill of his Megace (appetite stimulate). He was put on this med bu the provider at [facility] but since he is no longer under their care she needs a refill from PCP sent to [facility], please advise if this is something we can refill for patient and she would like a call back either way to confirm if we did or did not refill this med. She can be reached all day at home number.

## 2018-01-24 ENCOUNTER — Other Ambulatory Visit: Payer: Self-pay

## 2018-01-24 DIAGNOSIS — R339 Retention of urine, unspecified: Secondary | ICD-10-CM | POA: Diagnosis not present

## 2018-01-24 DIAGNOSIS — Z8673 Personal history of transient ischemic attack (TIA), and cerebral infarction without residual deficits: Secondary | ICD-10-CM | POA: Diagnosis not present

## 2018-01-24 DIAGNOSIS — Z7901 Long term (current) use of anticoagulants: Secondary | ICD-10-CM | POA: Diagnosis not present

## 2018-01-24 DIAGNOSIS — C7951 Secondary malignant neoplasm of bone: Secondary | ICD-10-CM | POA: Diagnosis not present

## 2018-01-24 DIAGNOSIS — I482 Chronic atrial fibrillation, unspecified: Secondary | ICD-10-CM | POA: Diagnosis not present

## 2018-01-24 DIAGNOSIS — N183 Chronic kidney disease, stage 3 (moderate): Secondary | ICD-10-CM | POA: Diagnosis not present

## 2018-01-24 DIAGNOSIS — Z466 Encounter for fitting and adjustment of urinary device: Secondary | ICD-10-CM | POA: Diagnosis not present

## 2018-01-24 DIAGNOSIS — I272 Pulmonary hypertension, unspecified: Secondary | ICD-10-CM | POA: Diagnosis not present

## 2018-01-24 DIAGNOSIS — I5033 Acute on chronic diastolic (congestive) heart failure: Secondary | ICD-10-CM | POA: Diagnosis not present

## 2018-01-24 DIAGNOSIS — E7800 Pure hypercholesterolemia, unspecified: Secondary | ICD-10-CM | POA: Diagnosis not present

## 2018-01-24 DIAGNOSIS — I472 Ventricular tachycardia: Secondary | ICD-10-CM | POA: Diagnosis not present

## 2018-01-24 DIAGNOSIS — I130 Hypertensive heart and chronic kidney disease with heart failure and stage 1 through stage 4 chronic kidney disease, or unspecified chronic kidney disease: Secondary | ICD-10-CM | POA: Diagnosis not present

## 2018-01-24 DIAGNOSIS — Z5181 Encounter for therapeutic drug level monitoring: Secondary | ICD-10-CM | POA: Diagnosis not present

## 2018-01-24 DIAGNOSIS — I350 Nonrheumatic aortic (valve) stenosis: Secondary | ICD-10-CM | POA: Diagnosis not present

## 2018-01-24 NOTE — Telephone Encounter (Signed)
Patient's wife called requesting a refill on Megace.  Medication was started for appetite stimulation at [facility] and patient is no longer under their care. Patient was last seen 12/29/2017. Confirmed with patient's wife the dose that the patient is taking. Please review and advise. MPulliam, CMA/RT(R)

## 2018-01-24 NOTE — Telephone Encounter (Signed)
Verified dose with patient's wife and sent request to Dr. Raliegh Scarlet to review. MPulliam, CMA/RT(R)

## 2018-01-25 MED ORDER — MEGESTROL ACETATE 40 MG/ML PO SUSP: 80.0000 mg | Freq: Two times a day (BID) | ORAL | 1 refills | Status: DC

## 2018-01-27 ENCOUNTER — Telehealth: Payer: Self-pay | Admitting: *Deleted

## 2018-01-27 ENCOUNTER — Other Ambulatory Visit: Payer: Self-pay | Admitting: Oncology

## 2018-01-27 DIAGNOSIS — Z466 Encounter for fitting and adjustment of urinary device: Secondary | ICD-10-CM | POA: Diagnosis not present

## 2018-01-27 DIAGNOSIS — I272 Pulmonary hypertension, unspecified: Secondary | ICD-10-CM | POA: Diagnosis not present

## 2018-01-27 DIAGNOSIS — N183 Chronic kidney disease, stage 3 (moderate): Secondary | ICD-10-CM | POA: Diagnosis not present

## 2018-01-27 DIAGNOSIS — Z7901 Long term (current) use of anticoagulants: Secondary | ICD-10-CM | POA: Diagnosis not present

## 2018-01-27 DIAGNOSIS — R339 Retention of urine, unspecified: Secondary | ICD-10-CM | POA: Diagnosis not present

## 2018-01-27 DIAGNOSIS — I130 Hypertensive heart and chronic kidney disease with heart failure and stage 1 through stage 4 chronic kidney disease, or unspecified chronic kidney disease: Secondary | ICD-10-CM | POA: Diagnosis not present

## 2018-01-27 DIAGNOSIS — I482 Chronic atrial fibrillation, unspecified: Secondary | ICD-10-CM | POA: Diagnosis not present

## 2018-01-27 DIAGNOSIS — E7800 Pure hypercholesterolemia, unspecified: Secondary | ICD-10-CM | POA: Diagnosis not present

## 2018-01-27 DIAGNOSIS — I472 Ventricular tachycardia: Secondary | ICD-10-CM | POA: Diagnosis not present

## 2018-01-27 DIAGNOSIS — Z5181 Encounter for therapeutic drug level monitoring: Secondary | ICD-10-CM | POA: Diagnosis not present

## 2018-01-27 DIAGNOSIS — I5033 Acute on chronic diastolic (congestive) heart failure: Secondary | ICD-10-CM | POA: Diagnosis not present

## 2018-01-27 DIAGNOSIS — Z8673 Personal history of transient ischemic attack (TIA), and cerebral infarction without residual deficits: Secondary | ICD-10-CM | POA: Diagnosis not present

## 2018-01-27 DIAGNOSIS — I350 Nonrheumatic aortic (valve) stenosis: Secondary | ICD-10-CM | POA: Diagnosis not present

## 2018-01-27 DIAGNOSIS — C7951 Secondary malignant neoplasm of bone: Secondary | ICD-10-CM | POA: Diagnosis not present

## 2018-01-27 NOTE — Telephone Encounter (Signed)
Spoke with natasha with hospice. Per dr Alen Blew, he will be the attending, hospice physicians may do symptom management and have DNR signed.

## 2018-01-27 NOTE — Telephone Encounter (Signed)
Spoke with wife betty, she states dr Alen Blew has already spoken to her VS:[id number] labs

## 2018-01-27 NOTE — Progress Notes (Signed)
Laboratory data from 02/20/2018 were personally reviewed and discussed with patient's wife Craig [name].  His PSA continues to rise currently at 487 but otherwise his laboratory data remain unchanged.  His clinical status continues to deteriorate according to his wife and currently mostly bedbound.  He is incontinence of urine and feces most of the time.  He does report periodic pain although is not using much of tramadol.  Given these findings, I have recommended that he finishes his [name] current supply and discontinue therapy after that.  I recommended proceeding with hospice given his overall decline.  His wife acknowledges his condition and will consider hospice option in the future.

## 2018-01-27 NOTE — Telephone Encounter (Signed)
No note

## 2018-01-30 ENCOUNTER — Telehealth: Payer: Self-pay

## 2018-01-30 ENCOUNTER — Ambulatory Visit (INDEPENDENT_AMBULATORY_CARE_PROVIDER_SITE_OTHER): Payer: Medicare Other

## 2018-01-30 DIAGNOSIS — I482 Chronic atrial fibrillation, unspecified: Secondary | ICD-10-CM | POA: Diagnosis not present

## 2018-01-30 DIAGNOSIS — Z5181 Encounter for therapeutic drug level monitoring: Secondary | ICD-10-CM

## 2018-01-30 LAB — POCT INR: INR: 2.4 (ref 2.0–3.0)

## 2018-01-30 NOTE — Telephone Encounter (Signed)
Received return message from patient's wife. Return call placed to patient's wife to schedule visit with Palliative Care. Visit scheduled for Thursday 02/02/18

## 2018-01-30 NOTE — Telephone Encounter (Signed)
Received a message form nurse navigator Cindy with [facility] requesting Palliative Care referral. This RN spoke with patient spouse to clarify request of Palliative care versus hospice care. After discussing the different services Craig [name] stated that she prefers Palliative services at this time so she can administer the rest of the Xtandi and [facility] can come in the house 1 to 2 more times for labs and Foley care. She understands that Palliative can then switch over to hospice care within the same agency when appropriate and agreed upon. Contacted [facility] and placed referral for Palliative services and made Cindy with [facility] aware of the referal as well.

## 2018-01-30 NOTE — Telephone Encounter (Signed)
Phone call placed to patient's home to speak with wife. Wife not home at this time. Message left.

## 2018-01-31 DIAGNOSIS — L89312 Pressure ulcer of right buttock, stage 2: Secondary | ICD-10-CM | POA: Diagnosis not present

## 2018-01-31 DIAGNOSIS — C7951 Secondary malignant neoplasm of bone: Secondary | ICD-10-CM | POA: Diagnosis not present

## 2018-01-31 DIAGNOSIS — N390 Urinary tract infection, site not specified: Secondary | ICD-10-CM | POA: Diagnosis not present

## 2018-01-31 DIAGNOSIS — I5033 Acute on chronic diastolic (congestive) heart failure: Secondary | ICD-10-CM | POA: Diagnosis not present

## 2018-02-02 ENCOUNTER — Other Ambulatory Visit: Payer: Medicare Other | Admitting: Internal Medicine

## 2018-02-02 DIAGNOSIS — Z515 Encounter for palliative care: Secondary | ICD-10-CM | POA: Diagnosis not present

## 2018-02-13 ENCOUNTER — Ambulatory Visit (INDEPENDENT_AMBULATORY_CARE_PROVIDER_SITE_OTHER): Payer: Medicare Other | Admitting: Pharmacist

## 2018-02-13 DIAGNOSIS — Z5181 Encounter for therapeutic drug level monitoring: Secondary | ICD-10-CM | POA: Diagnosis not present

## 2018-02-13 DIAGNOSIS — I482 Chronic atrial fibrillation, unspecified: Secondary | ICD-10-CM | POA: Diagnosis not present

## 2018-02-13 DIAGNOSIS — I5033 Acute on chronic diastolic (congestive) heart failure: Secondary | ICD-10-CM | POA: Diagnosis not present

## 2018-02-13 LAB — POCT INR: INR: 2.7 (ref 2.0–3.0)

## 2018-02-14 ENCOUNTER — Telehealth: Payer: Self-pay

## 2018-02-14 NOTE — Telephone Encounter (Signed)
Received call from Gonzella Lex NP with [facility] to state that the patient will be taking the last dose of Xtandi this week and will most likely be ready to transition to hospice services. She stated that she will call back to notify the office and receive verbal order for transition. She also had questions regarding the blood draws and coumadin. This RN explained that per EMR, Dr. Tyrell Antonio is managing that and can be called with any follow up questions.

## 2018-02-16 DIAGNOSIS — Z5181 Encounter for therapeutic drug level monitoring: Secondary | ICD-10-CM | POA: Diagnosis not present

## 2018-02-16 DIAGNOSIS — I130 Hypertensive heart and chronic kidney disease with heart failure and stage 1 through stage 4 chronic kidney disease, or unspecified chronic kidney disease: Secondary | ICD-10-CM | POA: Diagnosis not present

## 2018-02-17 ENCOUNTER — Telehealth: Payer: Self-pay

## 2018-02-17 NOTE — Telephone Encounter (Signed)
Received call from [facility] with [facility] stating that she has a home visit scheduled on 1/7 for foley cath change and recert visit and would like to know if any labs need to be drawn. Called back and left message that per Dr. Alen Blew no labs needed on the 1/6 home visit.

## 2018-02-20 ENCOUNTER — Other Ambulatory Visit: Payer: Medicare Other | Admitting: Internal Medicine

## 2018-02-20 ENCOUNTER — Telehealth: Payer: Self-pay

## 2018-02-20 DIAGNOSIS — Z515 Encounter for palliative care: Secondary | ICD-10-CM | POA: Diagnosis not present

## 2018-02-20 NOTE — Telephone Encounter (Signed)
Received call from patient spouse stating that the [facility] RN will visit on 1/6 to change the foley catheter. After this appointment she wishes for the patient to transition form Palliative Care to hospice care. She stated that the [facility] NP has been visiting and will visit today and she does understand the transition and the goal of hospice care. Explained that the [facility] NP will call prior to there transition of services to receive a verbal order from Dr. Alen Blew for transition in care.

## 2018-02-24 ENCOUNTER — Telehealth: Payer: Self-pay | Admitting: *Deleted

## 2018-02-24 NOTE — Telephone Encounter (Signed)
Spoke with shirley cates NP with palliative care. Family requesting hospice to start 02/27/18. Dr Alen Blew to be the attending, hospice physicians may do symptom management and have DNR signed. Xtandi dc'd.

## 2018-02-26 NOTE — Progress Notes (Signed)
[facility] Telephone: [phone] Fax: [phone]  PATIENT NAME: Craig [name]. DOB: 08/13/1924 MRN: [id number]  PRIMARY CARE PROVIDER:   Mellody Dance, DO  REFERRING PROVIDER:  Dr. Alen [name]  RESPONSIBLE PARTYInez Catalina [name](wife) [phone]    RECOMMENDATIONS and PLAN:  1.  Palliative Care Encounter Z51.5: Full discussion with wife and patient related to goals of care which are now for comfort care at home.  They have decided to end treatment of prostate cancer using Xtandi.  No desire to return to the hospital for any treatments. Wife collects blood sample for INR and receives orders from cardiologist for Warfarin dosing.  Oncology office contacted and spoke to nurse Dixie who conferred an order from Dr. Alen [name] for Hospice referral.  He will be to [facility]. Wife and patient are in agreement with plan.  -Shortness of breath:  Baseline with expectation of progression.  PRN oxygen use at home to maintain sats at or above 90%.  Continue daily diuretic use.  Elevate head.  -Edema:  As related to CHF and progressive cancer.  Continue diuretic, compression hose and leg elevation.  Further management by Hospice team    I spent 60 minutes providing this consultation,  from 1:00pm to 2:00pm at home. More than 50% of the time in this consultation was spent coordinating communication with patient and wife.   HISTORY OF PRESENT ILLNESS:  Follow-up with  Craig [name].. Wife reports increased sleeping, confusion and shortness of breath. He normally eats 2 small meals daily supplemented with a protein shake. He remains non-ambulatory and is transferred via [place].  Reports that he will have final visit and foley catheter change from home health on 02/27/18 as well as cessation of chemotherapy med Xtandi on 1/6 as well.  Lupron has previously been discontinued.  No plans for any additional treatment of metastatic prostate cancer or return to the hospital.                                                                                                                                                                                                                        Pertinent Labs:  BUN 104  CREA 1.56  GFR 38  WBC 11.4 on 01/24/18  Increased from BUN 66 and CREA 1.22  And GFR of 52 in 10/19.  Most recent PSA is 487 while on Lupron and Xtandi. EF 55% on 11/02/17  EF 35-40% on 01/01/15  CODE STATUS: DNR/DNI  PPS: 30%  HOSPICE ELIGIBILITY/DIAGNOSIS: YES/  Progressive metastatic prostate cancer   PAST MEDICAL HISTORY:  Past Medical History:  Diagnosis Date  . Atrial fibrillation ([place])   . Chronic back pain   . GERD (gastroesophageal reflux disease)   . H/O hiatal hernia   . History of epistaxis 07/19/2002  . Hyperlipidemia   . Hypertension   . Hypertensive heart disease   . Hypokalemia   . ICH (intracerebral hemorrhage) ([place]) ~ 1999   after TPA/notes 09/27/2012  . Melanoma ([place])    "top of my head" (09/27/2012)  . Migraines    "migraines without headaches years ago" (09/27/2012)  . Osteoarthritis of both feet   . PAT (paroxysmal atrial tachycardia) ([place])   . Personal history of long-term (current) use of anticoagulants   . Pneumonia    "once; several years ago" (09/27/2012)  . Prostate cancer, primary, with metastasis from prostate to other site [facility])    on Lupron injections per GU  . S/P radiation therapy 02/13/14-02/27/14   SRS 5/5 spine completed 02/27/14  . S/P radiation therapy 05/27/14-05/31/14   right femur 20Gy/13fx  . Stroke [facility]) ~ 1999   ischemic / right cerebellar/posterior inferior cerebellar artery / right pos infarct    SOCIAL HX:  Social History   Tobacco Use  . Smoking status: Former Smoker    Years: 0.50    Types: Cigarettes    Last attempt to quit: 02/22/1953    Years since quitting: 65.0  . Smokeless tobacco: Never Used  Substance Use Topics  . Alcohol use: No    Comment: 09/27/2012 "glass of wine or 2/month"    ALLERGIES:  Allergies   Allergen Reactions  . Pravachol Other (See Comments)    Muscle weakness  . Pravastatin Other (See Comments)    Muscle weakness  . Simvastatin Other (See Comments)    Muscle weakness  . Zocor [Simvastatin - High Dose] Other (See Comments)    Muscle weakness      PHYSICAL EXAM:   General:  Chronically ill appearing elderly male in recliner Cardiovascular: regular rate and irregular rhythm Pulmonary: increased work of breathing. Clear breath sounds anteriorly Abdomen: Protuberant, soft, nontender, + bowel sounds GU: foley catheter in place, amber urine, no suprapubic tenderness Extremities: 2+ edema BLE beginning above knees.  Compression socks in use Skin: exposed skin intact Neurological: Alert and oriented x2 but drifts off to sleep during visit.Generalized weakness.  Slow speech    Gonzella Lex, NP

## 2018-02-27 ENCOUNTER — Other Ambulatory Visit: Payer: Self-pay | Admitting: Family Medicine

## 2018-02-27 ENCOUNTER — Ambulatory Visit (INDEPENDENT_AMBULATORY_CARE_PROVIDER_SITE_OTHER): Payer: Medicare Other | Admitting: Cardiovascular Disease

## 2018-02-27 DIAGNOSIS — I482 Chronic atrial fibrillation, unspecified: Secondary | ICD-10-CM | POA: Diagnosis not present

## 2018-02-27 DIAGNOSIS — Z5181 Encounter for therapeutic drug level monitoring: Secondary | ICD-10-CM

## 2018-02-27 LAB — POCT INR: INR: 1.4 — AB (ref 2.0–3.0)

## 2018-02-27 MED ORDER — MEGESTROL ACETATE 40 MG/ML PO SUSP: 400.0000 mg | Freq: Two times a day (BID) | ORAL | 1 refills | Status: AC

## 2018-02-27 NOTE — Telephone Encounter (Signed)
Patient's wife/ [name] called states Rx correction for Megace should be :  Megestrol Acetate (MEGACE ORAL PO) [[id number]]   Order Details  Dose: 10 mL Route: Oral Frequency: --  Dispense Quantity: -- Refills: -- Fills remaining: --        Sig: Take 10 mLs by mouth.       Written Date: -- Expiration Date: -- Ordering Date: 12/19/17   Start Date: -- End Date: --         Ordering Provider: -- DEA #:  -- NPI:  --   Authorizing Provider: [provider] DEA #:  -- NPI:  [id number]   Ordering User:  Marcos Eke, RN            Pharmacy:  [facility], [address] DEA #:  --        --Pt's wife states right now Rx is for :  (she apologizes says gave incorrect information to nurse on 01/25/18)   megestrol (MEGACE) 40 MG/ML suspension [[id number]]   Order Details  Dose: 80 mg Route: Oral Frequency: 2 times daily  Dispense Quantity: 480 mL Refills: 1 Fills remaining: --        Sig: Take 2 mLs (80 mg total) by mouth 2 (two) times daily. (needs OV for RF)       Written Date: 01/25/18 Expiration Date: 01/25/19    Start Date: 01/25/18 End Date: --         Ordering Provider: -- DEA #:  -- NPI:  --   Authorizing Provider: Mellody Dance, DO DEA #:  [id number] NPI:  [id number]   Ordering User:  Mellody Dance, DO        --Forwarding message to medical assistant regarding corrections & request for refill on Correct dosage to : [facility].  --glh

## 2018-02-27 NOTE — Telephone Encounter (Signed)
Patient's wife had given incorrect dosage of the medication.  Patient was started on the Megestrol Acetate 40mg /mL.  She had informed us that he was taking 2 mL  BID.  The correct dosage of how medication had been started on the patient is 10 mL BID.  I called and verified with [facility] that the medication had been pervious written for 10 mL and and not 2 mL.  Please review and advise. MPulliam, CMA/RT(R)

## 2018-03-05 NOTE — Progress Notes (Signed)
[facility] Telephone: [phone] Fax: [phone]  PATIENT NAME: Craig [name]. DOB: 1924-05-25 MRN: [id number]  PRIMARY CARE PROVIDER:   Mellody Dance, DO  REFERRING PROVIDER: Dr. Alen Blew RESPONSIBLE PARTY:   [name] Thorp(wife)    RECOMMENDATIONS and PLAN:   1.  Palliative Care Encounter Z51.5:   -Advanced care planning:   Explanation of Palliative and Hospice care services. Goals of care reviewed which are to complete PT at home, complete current supply of Xtandi( early January) for prostate cancer treatment followed by transition to Hospice care. Reports that PSA continues to rise despite treatments.   He does not desire return to the hospital. Home health nursing for monthly foley change.  Code status is DNAR with comfort measures, No tube feeding, antibiotics if indicated and no IV fluids. MOST form was completed and given to wife. Palliative care will F/U on 12/30 for status re-evaluation.  Communicate with oncology to update re: wife's plans.  -Shortness of breath:  Chronic with disease/edema progression.  Maintain O2 sats at 92% or greater.  PRN home O2 use.  Edema:  Chronic with CHF and advancement of metastatic disease.  Continue diuretics, compression socks and leg elevation.   I spent 60 minutes providing this consultation,  from 2:30pm to 3:30pm at chairside. More than 50% of the time in this consultation was spent coordinating communication patient and wife.   HISTORY OF PRESENT ILLNESS:  Craig [name]. is a 85 y.o. year old retired [employer] with multiple medical problems including CHF(EF 55% in 9/19) with Afib(Warfarin for anticoagulation), CKD stage 3,previous CVA and is non-ambulatory.  He also has a hx of metastatic prostate cancer that has been treated with radiation, Lupron and is currently taking Xtandi.  He was admitted to the hospital from 9/23-9/27 due to sepsis, CHF exacerbation, aspiration PNA.  He was admitted to [place]  home from 9/27-10/23 followed by return to home.  Wife reports that he is much weaker and has confusion since above illness.  He sleeps often, has a decreased appetite requiring daily Ensure, is incontinent of B&B,  He has 24hr hired caregivers and is total care.  He is transferred from bed to recliner via [place].  Palliative Care was asked to help address goals of care and end of life planning.  CODE STATUS: DNAR/DNI  PPS: 30% weak HOSPICE ELIGIBILITY/DIAGNOSIS: TBD- Still taking oral chemotherapy  PAST MEDICAL HISTORY:  Past Medical History:  Diagnosis Date  . Atrial fibrillation ([place])   . Chronic back pain   . GERD (gastroesophageal reflux disease)   . H/O hiatal hernia   . History of epistaxis 07/19/2002  . Hyperlipidemia   . Hypertension   . Hypertensive heart disease   . Hypokalemia   . ICH (intracerebral hemorrhage) ([place]) ~ 1999   after TPA/notes 09/27/2012  . Melanoma ([place])    "top of my head" (09/27/2012)  . Migraines    "migraines without headaches years ago" (09/27/2012)  . Osteoarthritis of both feet   . PAT (paroxysmal atrial tachycardia) ([place])   . Personal history of long-term (current) use of anticoagulants   . Pneumonia    "once; several years ago" (09/27/2012)  . Prostate cancer, primary, with metastasis from prostate to other site [facility])    on Lupron injections per GU  . S/P radiation therapy 02/13/14-02/27/14   SRS 5/5 spine completed 02/27/14  . S/P radiation therapy 05/27/14-05/31/14   right femur 20Gy/32fx  . Stroke [facility]) ~ 1999   ischemic /  right cerebellar/posterior inferior cerebellar artery / right pos infarct     PHYSICAL EXAM:   General: Well nourished elderly male in recliner, appears fatigued. In NAD Cardiovascular:  Systolic murmur, irregular  Rhythm at 72 Pulmonary: slight increased work of breathing, sats 95% on room air Abdomen: large,soft, nontender, + bowel sounds GU: no suprapubic tenderness,  fjoley catheter in place. Patent of clear amber  urine Extremities: 2+ edema to knees. Compression hose and elevation noted Skin: Exposed skin intact Neurological: A&O x2.  Forced speech.  Nods intermittently  Gonzella Lex, NP-C

## 2018-03-09 ENCOUNTER — Ambulatory Visit (INDEPENDENT_AMBULATORY_CARE_PROVIDER_SITE_OTHER): Payer: Medicare Other | Admitting: Cardiovascular Disease

## 2018-03-09 DIAGNOSIS — Z5181 Encounter for therapeutic drug level monitoring: Secondary | ICD-10-CM | POA: Diagnosis not present

## 2018-03-09 DIAGNOSIS — I482 Chronic atrial fibrillation, unspecified: Secondary | ICD-10-CM

## 2018-03-09 LAB — POCT INR: INR: 2.8 (ref 2.0–3.0)

## 2018-03-15 ENCOUNTER — Ambulatory Visit (INDEPENDENT_AMBULATORY_CARE_PROVIDER_SITE_OTHER): Payer: Medicare Other | Admitting: Cardiovascular Disease

## 2018-03-15 DIAGNOSIS — Z5181 Encounter for therapeutic drug level monitoring: Secondary | ICD-10-CM | POA: Diagnosis not present

## 2018-03-15 DIAGNOSIS — I482 Chronic atrial fibrillation, unspecified: Secondary | ICD-10-CM

## 2018-03-15 LAB — POCT INR: INR: 4.9 — AB (ref 2.0–3.0)

## 2018-03-15 NOTE — Patient Instructions (Signed)
Description   Spoke with [facility] nurse while in home, instructed to hold warfarin today and hold  tomorrow, then take the dose you should be taking which is 1/2 tablet daily.  Recheck in 1 week-Hospice/Self tester. Call with any concerns new medication or if scheduled for any procedures [phone]

## 2018-03-22 ENCOUNTER — Ambulatory Visit (INDEPENDENT_AMBULATORY_CARE_PROVIDER_SITE_OTHER): Admitting: Pharmacist

## 2018-03-22 DIAGNOSIS — I482 Chronic atrial fibrillation, unspecified: Secondary | ICD-10-CM | POA: Diagnosis not present

## 2018-03-22 DIAGNOSIS — Z5181 Encounter for therapeutic drug level monitoring: Secondary | ICD-10-CM

## 2018-03-22 LAB — POCT INR: INR: 3.5 — AB (ref 2.0–3.0)

## 2018-03-22 MED ORDER — WARFARIN SODIUM 2.5 MG PO TABS: ORAL_TABLET | ORAL | 3 refills | Status: DC

## 2018-03-24 ENCOUNTER — Ambulatory Visit: Payer: Medicare Other | Admitting: Interventional Cardiology

## 2018-03-24 ENCOUNTER — Other Ambulatory Visit: Payer: Self-pay

## 2018-03-24 MED ORDER — AMLODIPINE BESYLATE 10 MG PO TABS: 10.0000 mg | ORAL_TABLET | Freq: Every day | ORAL | 3 refills | Status: AC

## 2018-03-24 MED ORDER — FUROSEMIDE 40 MG PO TABS: 40.0000 mg | ORAL_TABLET | Freq: Two times a day (BID) | ORAL | 3 refills | Status: AC

## 2018-03-24 MED ORDER — ATORVASTATIN CALCIUM 10 MG PO TABS: 10.0000 mg | ORAL_TABLET | Freq: Every day | ORAL | 3 refills | Status: AC

## 2018-03-24 MED ORDER — LOSARTAN POTASSIUM 100 MG PO TABS: 100.0000 mg | ORAL_TABLET | Freq: Every day | ORAL | 3 refills | Status: AC

## 2018-03-24 MED ORDER — EZETIMIBE 10 MG PO TABS: 10.0000 mg | ORAL_TABLET | Freq: Every day | ORAL | 3 refills | Status: AC

## 2018-03-24 MED ORDER — POTASSIUM CHLORIDE CRYS ER 20 MEQ PO TBCR: 20.0000 meq | EXTENDED_RELEASE_TABLET | Freq: Every day | ORAL | 3 refills | Status: AC

## 2018-03-29 ENCOUNTER — Ambulatory Visit (INDEPENDENT_AMBULATORY_CARE_PROVIDER_SITE_OTHER): Payer: Medicare Other | Admitting: Cardiology

## 2018-03-29 DIAGNOSIS — I482 Chronic atrial fibrillation, unspecified: Secondary | ICD-10-CM

## 2018-03-29 DIAGNOSIS — Z5181 Encounter for therapeutic drug level monitoring: Secondary | ICD-10-CM | POA: Diagnosis not present

## 2018-03-29 LAB — POCT INR: INR: 1.6 — AB (ref 2.0–3.0)

## 2018-03-29 NOTE — Patient Instructions (Addendum)
Description   Spoke with [facility] nurse while in home, instructed to take 1.5 tablets (3.75mg ) today then continue taking 1 tablet (2.5mg ) daily except 1/2 tablet (1.25mg  on Sat).  Recheck in 1 week-Hospice/Self tester. Call with any concerns new medication or if scheduled for any procedures [phone]

## 2018-04-05 ENCOUNTER — Ambulatory Visit (INDEPENDENT_AMBULATORY_CARE_PROVIDER_SITE_OTHER): Payer: Medicare Other | Admitting: Pharmacist

## 2018-04-05 DIAGNOSIS — Z5181 Encounter for therapeutic drug level monitoring: Secondary | ICD-10-CM

## 2018-04-05 DIAGNOSIS — I482 Chronic atrial fibrillation, unspecified: Secondary | ICD-10-CM | POA: Diagnosis not present

## 2018-04-05 LAB — POCT INR: INR: 1.6 — AB (ref 2.0–3.0)

## 2018-04-12 ENCOUNTER — Ambulatory Visit (INDEPENDENT_AMBULATORY_CARE_PROVIDER_SITE_OTHER): Admitting: Pharmacist

## 2018-04-12 DIAGNOSIS — I482 Chronic atrial fibrillation, unspecified: Secondary | ICD-10-CM | POA: Diagnosis not present

## 2018-04-12 DIAGNOSIS — Z5181 Encounter for therapeutic drug level monitoring: Secondary | ICD-10-CM

## 2018-04-12 LAB — POCT INR: INR: 1.8 — AB (ref 2.0–3.0)

## 2018-04-26 ENCOUNTER — Ambulatory Visit (INDEPENDENT_AMBULATORY_CARE_PROVIDER_SITE_OTHER): Admitting: Pharmacist

## 2018-04-26 DIAGNOSIS — Z5181 Encounter for therapeutic drug level monitoring: Secondary | ICD-10-CM

## 2018-04-26 DIAGNOSIS — I482 Chronic atrial fibrillation, unspecified: Secondary | ICD-10-CM

## 2018-04-26 LAB — POCT INR: INR: 1.8 — AB (ref 2.0–3.0)

## 2018-05-10 ENCOUNTER — Ambulatory Visit (INDEPENDENT_AMBULATORY_CARE_PROVIDER_SITE_OTHER): Admitting: Internal Medicine

## 2018-05-10 DIAGNOSIS — Z5181 Encounter for therapeutic drug level monitoring: Secondary | ICD-10-CM | POA: Diagnosis not present

## 2018-05-10 DIAGNOSIS — I482 Chronic atrial fibrillation, unspecified: Secondary | ICD-10-CM | POA: Diagnosis not present

## 2018-05-10 LAB — POCT INR: INR: 1.6 — AB (ref 2.0–3.0)

## 2018-05-10 NOTE — Patient Instructions (Signed)
Description   Spoke with [facility] nurse while in home, will have pt take 2 tablets (5mg ) today continue taking 1 tablet (2.5mg ) daily.  Recheck in 2 week-Hospice/Self tester. Call with any concerns new medication or if scheduled for any procedures [phone]

## 2018-05-24 ENCOUNTER — Ambulatory Visit (INDEPENDENT_AMBULATORY_CARE_PROVIDER_SITE_OTHER): Admitting: Pharmacist

## 2018-05-24 DIAGNOSIS — Z5181 Encounter for therapeutic drug level monitoring: Secondary | ICD-10-CM

## 2018-05-24 DIAGNOSIS — I482 Chronic atrial fibrillation, unspecified: Secondary | ICD-10-CM

## 2018-05-24 LAB — POCT INR: INR: 1.5 — AB (ref 2.0–3.0)

## 2018-06-07 ENCOUNTER — Ambulatory Visit (INDEPENDENT_AMBULATORY_CARE_PROVIDER_SITE_OTHER): Admitting: Pharmacist

## 2018-06-07 DIAGNOSIS — I482 Chronic atrial fibrillation, unspecified: Secondary | ICD-10-CM

## 2018-06-07 DIAGNOSIS — Z5181 Encounter for therapeutic drug level monitoring: Secondary | ICD-10-CM | POA: Diagnosis not present

## 2018-06-07 LAB — POCT INR: INR: 2 (ref 2.0–3.0)

## 2018-06-21 ENCOUNTER — Ambulatory Visit (INDEPENDENT_AMBULATORY_CARE_PROVIDER_SITE_OTHER): Admitting: Pharmacist

## 2018-06-21 DIAGNOSIS — I482 Chronic atrial fibrillation, unspecified: Secondary | ICD-10-CM | POA: Diagnosis not present

## 2018-06-21 DIAGNOSIS — Z5181 Encounter for therapeutic drug level monitoring: Secondary | ICD-10-CM

## 2018-06-21 LAB — POCT INR: INR: 2.1 (ref 2.0–3.0)

## 2018-07-05 ENCOUNTER — Ambulatory Visit (INDEPENDENT_AMBULATORY_CARE_PROVIDER_SITE_OTHER): Admitting: Pharmacist

## 2018-07-05 DIAGNOSIS — I482 Chronic atrial fibrillation, unspecified: Secondary | ICD-10-CM

## 2018-07-05 DIAGNOSIS — Z5181 Encounter for therapeutic drug level monitoring: Secondary | ICD-10-CM | POA: Diagnosis not present

## 2018-07-05 LAB — POCT INR: INR: 1.8 — AB (ref 2.0–3.0)

## 2018-07-26 ENCOUNTER — Ambulatory Visit (INDEPENDENT_AMBULATORY_CARE_PROVIDER_SITE_OTHER): Admitting: Cardiovascular Disease

## 2018-07-26 DIAGNOSIS — Z5181 Encounter for therapeutic drug level monitoring: Secondary | ICD-10-CM | POA: Diagnosis not present

## 2018-07-26 DIAGNOSIS — I482 Chronic atrial fibrillation, unspecified: Secondary | ICD-10-CM | POA: Diagnosis not present

## 2018-07-26 LAB — POCT INR: INR: 1.8 — AB (ref 2.0–3.0)

## 2018-07-26 NOTE — Patient Instructions (Signed)
Description   Spoke with [facility] nurse and pt wife while in home,continue taking 1 tablet (2.5mg ) daily except 2 tablets on Saturdays.  Recheck in 4 weeks-Hospice/Self tester. Call with any concerns new medication or if scheduled for any procedures [phone].

## 2018-07-28 ENCOUNTER — Other Ambulatory Visit: Payer: Self-pay | Admitting: Interventional Cardiology

## 2018-08-22 ENCOUNTER — Ambulatory Visit (INDEPENDENT_AMBULATORY_CARE_PROVIDER_SITE_OTHER): Admitting: Internal Medicine

## 2018-08-22 DIAGNOSIS — I482 Chronic atrial fibrillation, unspecified: Secondary | ICD-10-CM

## 2018-08-22 DIAGNOSIS — Z5181 Encounter for therapeutic drug level monitoring: Secondary | ICD-10-CM

## 2018-08-22 LAB — POCT INR: INR: 1.7 — AB (ref 2.0–3.0)

## 2018-08-22 NOTE — Patient Instructions (Signed)
Description   Spoke with [facility] [place] nurse while in pt's home, take 1.5 tablets today, then continue taking 1 tablet (2.5mg ) daily except 2 tablets on Saturdays.  Recheck in 3 weeks-Hospice/Self tester. Call with any concerns new medication or if scheduled for any procedures [phone].

## 2018-08-31 ENCOUNTER — Other Ambulatory Visit: Payer: Self-pay

## 2018-08-31 NOTE — Patient Outreach (Signed)
Not interested in care mgmt program.  

## 2018-09-08 ENCOUNTER — Other Ambulatory Visit: Payer: Self-pay | Admitting: Interventional Cardiology

## 2018-09-11 NOTE — Telephone Encounter (Signed)
Outpatient Medication Detail   Disp Refills Start End   losartan (COZAAR) 100 MG tablet 90 tablet 3 03/24/2018    Sig - Route: Take 1 tablet (100 mg total) by mouth daily. - Oral   Sent to pharmacy as: losartan (COZAAR) 100 MG tablet   E-Prescribing Status: Receipt confirmed by pharmacy (03/24/2018 11:11 AM EST)   Pharmacy  [facility], [address]

## 2018-09-12 ENCOUNTER — Ambulatory Visit (INDEPENDENT_AMBULATORY_CARE_PROVIDER_SITE_OTHER): Payer: Medicare Other

## 2018-09-12 DIAGNOSIS — Z5181 Encounter for therapeutic drug level monitoring: Secondary | ICD-10-CM

## 2018-09-12 DIAGNOSIS — I482 Chronic atrial fibrillation, unspecified: Secondary | ICD-10-CM | POA: Diagnosis not present

## 2018-09-12 LAB — POCT INR: INR: 1.5 — AB (ref 2.0–3.0)

## 2018-09-12 NOTE — Patient Instructions (Signed)
Description   Spoke with [facility] nurse while in pt's home, advised to have pt start taking 1 tablet (2.5mg ) daily except 2 tablets on Tuesdays and Saturdays.  Recheck in 1 week-Hospice/Self tester. Call with any concerns new medication or if scheduled for any procedures [phone].

## 2018-09-23 DEATH — deceased

## 2018-12-05 IMAGING — DX DG CHEST 1V PORT
1 series · 1 of 1 positions shown · non-contrast
Comparison: 07/17/2014.

CLINICAL DATA: Altered mental status and fever. Ex-smoker.

EXAM:
PORTABLE CHEST 1 VIEW

[chest ap]
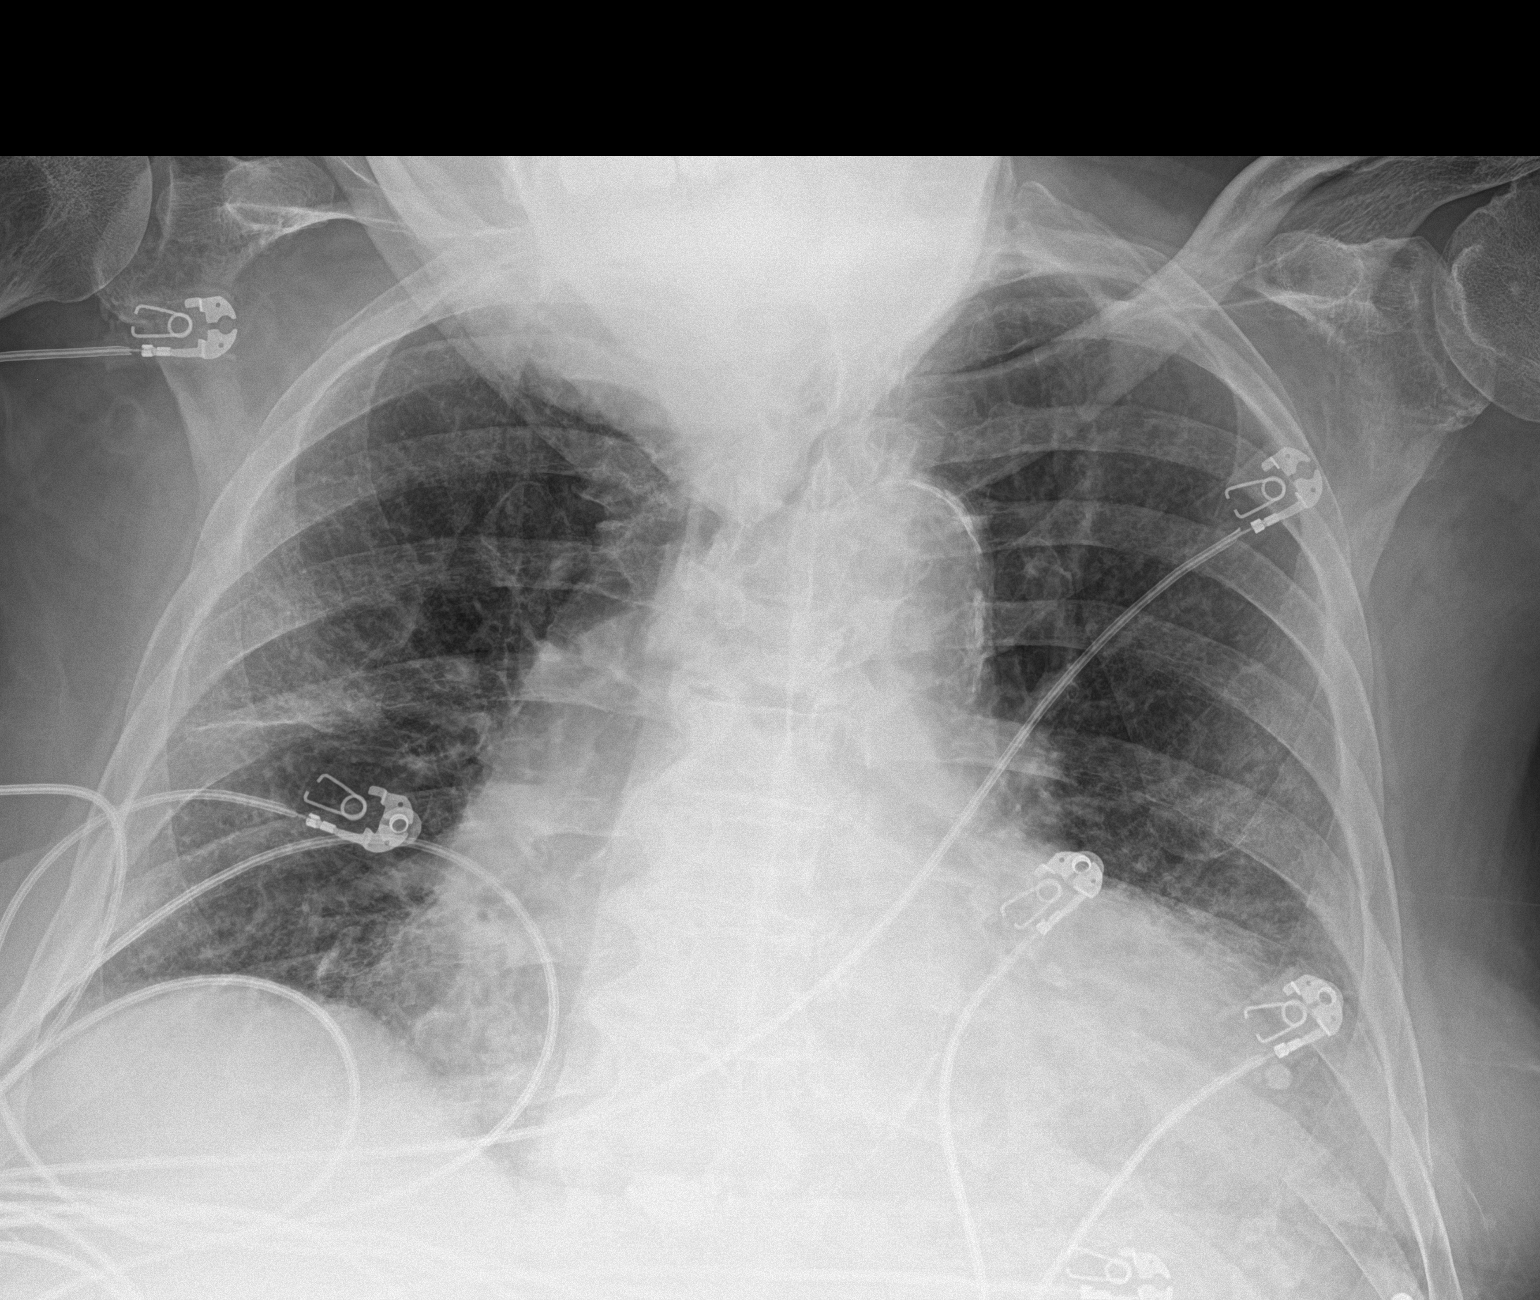

[1 of 1 positions shown; findings below may reference images not displayed]

FINDINGS: Stable enlarged cardiac silhouette and tortuous and calcified
thoracic aorta. The interstitial markings remain mildly prominent.
Stable small amount of linear scarring in the right mid lung zone.
Thoracic spine degenerative changes.
IMPRESSION: Stable cardiomegaly and mild chronic interstitial lung disease. No
acute abnormality.

## 2018-12-09 IMAGING — DX DG CHEST 1V PORT
1 series · 1 of 1 positions shown · non-contrast
Comparison: 10/29/2017, 07/17/2014

CLINICAL DATA: PICC placement

EXAM:
PORTABLE CHEST 1 VIEW

[chest]
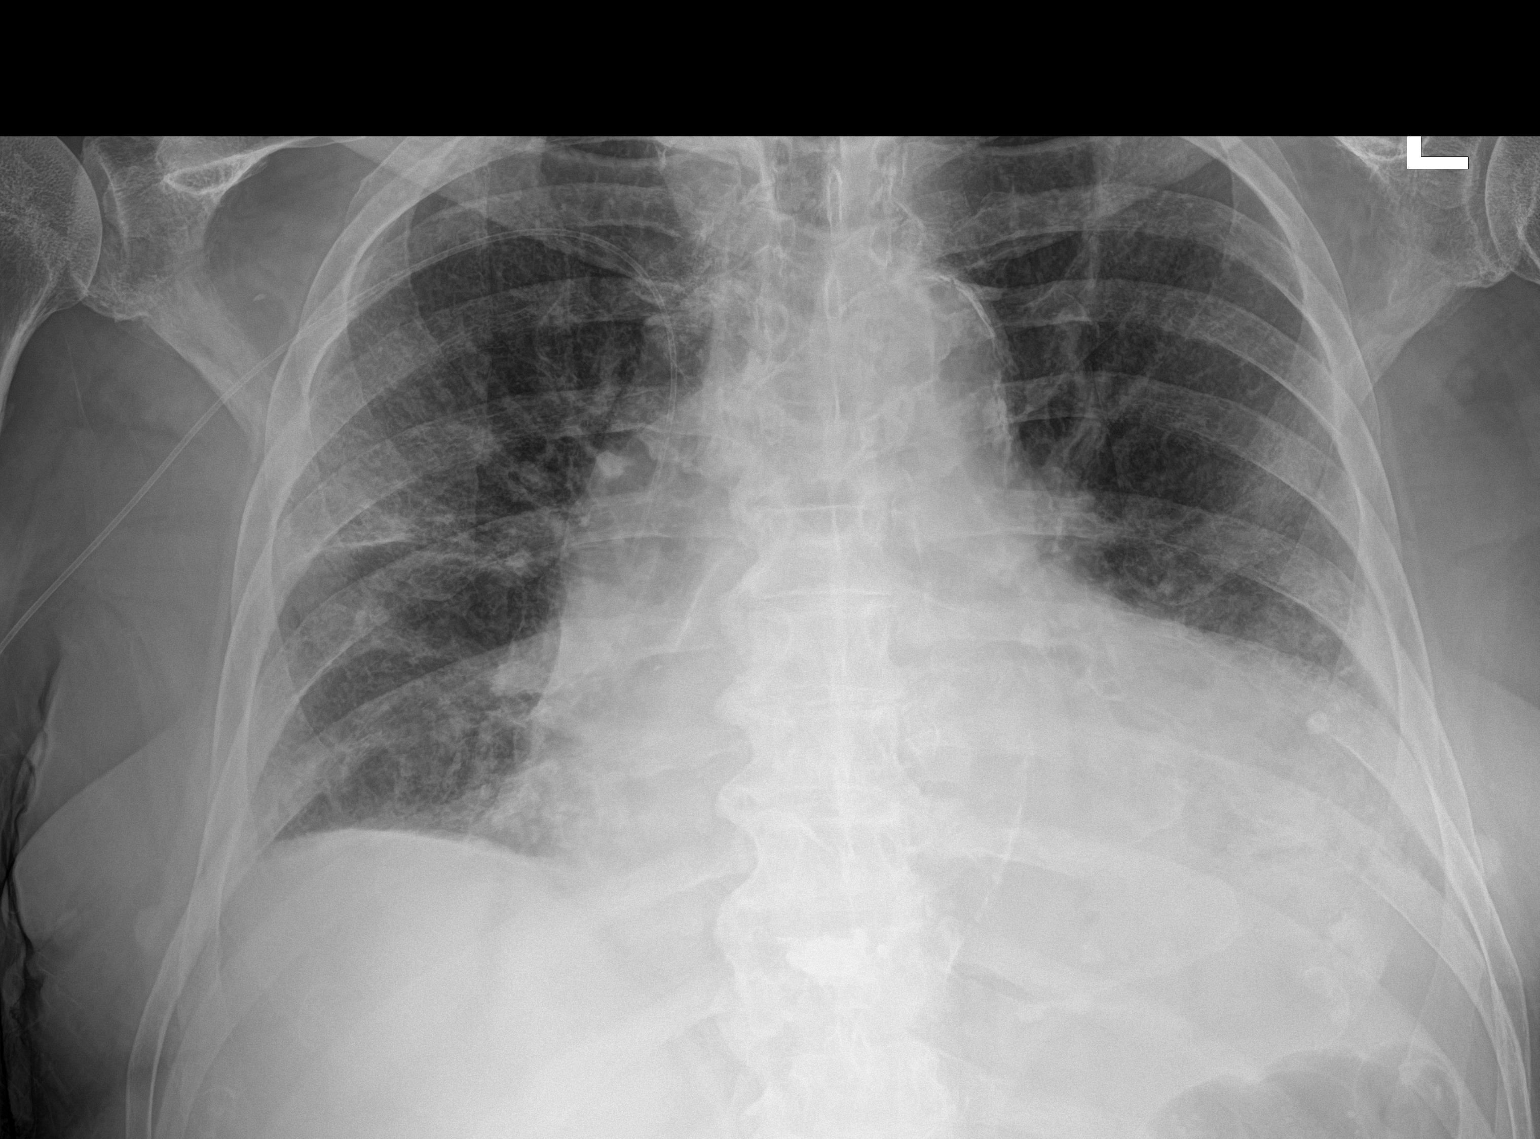

[1 of 1 positions shown; findings below may reference images not displayed]

FINDINGS: Right upper extremity catheter tip overlies the SVC. Cardiomegaly
with trace right pleural effusion and airspace disease at the left
base. Aortic atherosclerosis. No pneumothorax.
IMPRESSION: 1. Right upper extremity catheter tip overlies the SVC.
2. Cardiomegaly
3. Probable tiny right effusion. Airspace disease at the left base
which may reflect atelectasis or an infiltrate.

## 2018-12-23 IMAGING — DX DG CHEST 1V PORT
1 series · 1 of 1 positions shown · non-contrast
Comparison: November 14, 2017

CLINICAL DATA: Aspiration pneumonia

EXAM:
PORTABLE CHEST 1 VIEW

[chest]
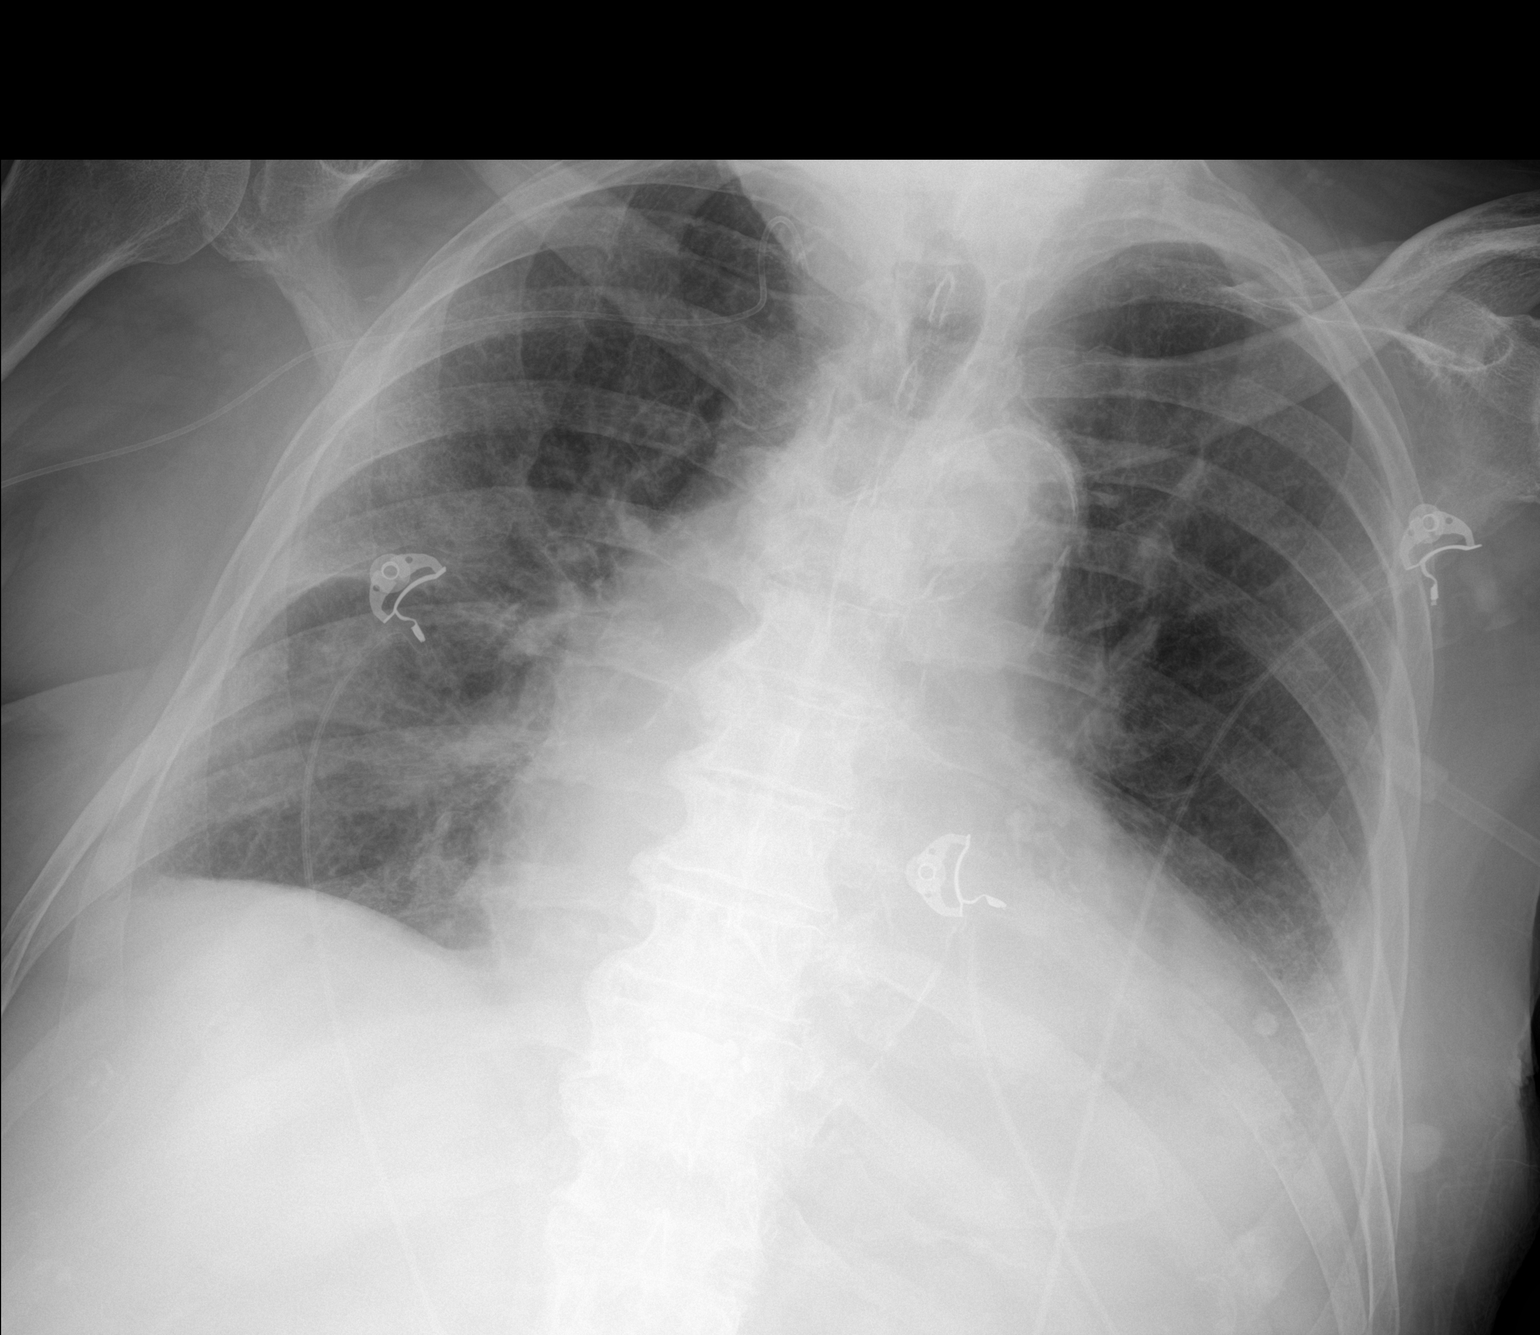

[1 of 1 positions shown; findings below may reference images not displayed]

FINDINGS: The mediastinal contour is stable. Heart size is enlarged. Right
central venous line is identified distal tip probably in the
subclavian/ jugular vein junction. Patchy opacity of the right mid
lung is unchanged. Dense consolidation of the left lung base is
unchanged.
IMPRESSION: No significant interval change in the patchy consolidation right mid
lung and dense consolidation of left lung base compared prior exam.

Right central venous line is identified distal tip probably in the
subclavian/jugular vein junction.
